# Patient Record
Sex: Female | Born: 1940 | Race: Black or African American | Hispanic: No | Marital: Single | Smoking: Former smoker
Health system: Southern US, Community
[De-identification: ages and names within clinical notes are randomized; demographics above are authoritative.]

## PROBLEM LIST (undated history)

## (undated) DIAGNOSIS — Z972 Presence of dental prosthetic device (complete) (partial): Secondary | ICD-10-CM

## (undated) DIAGNOSIS — R519 Headache, unspecified: Secondary | ICD-10-CM

## (undated) DIAGNOSIS — H269 Unspecified cataract: Secondary | ICD-10-CM

## (undated) DIAGNOSIS — Z8781 Personal history of (healed) traumatic fracture: Secondary | ICD-10-CM

## (undated) DIAGNOSIS — D649 Anemia, unspecified: Secondary | ICD-10-CM

## (undated) DIAGNOSIS — M1990 Unspecified osteoarthritis, unspecified site: Secondary | ICD-10-CM

## (undated) DIAGNOSIS — K08109 Complete loss of teeth, unspecified cause, unspecified class: Secondary | ICD-10-CM

## (undated) DIAGNOSIS — N183 Chronic kidney disease, stage 3 unspecified: Secondary | ICD-10-CM

## (undated) DIAGNOSIS — F419 Anxiety disorder, unspecified: Secondary | ICD-10-CM

## (undated) DIAGNOSIS — N390 Urinary tract infection, site not specified: Secondary | ICD-10-CM

## (undated) DIAGNOSIS — N83201 Unspecified ovarian cyst, right side: Secondary | ICD-10-CM

## (undated) DIAGNOSIS — K5900 Constipation, unspecified: Secondary | ICD-10-CM

## (undated) DIAGNOSIS — Z9221 Personal history of antineoplastic chemotherapy: Secondary | ICD-10-CM

## (undated) DIAGNOSIS — Z973 Presence of spectacles and contact lenses: Secondary | ICD-10-CM

## (undated) DIAGNOSIS — E785 Hyperlipidemia, unspecified: Secondary | ICD-10-CM

## (undated) DIAGNOSIS — K56609 Unspecified intestinal obstruction, unspecified as to partial versus complete obstruction: Secondary | ICD-10-CM

## (undated) DIAGNOSIS — C189 Malignant neoplasm of colon, unspecified: Secondary | ICD-10-CM

## (undated) DIAGNOSIS — T4145XA Adverse effect of unspecified anesthetic, initial encounter: Secondary | ICD-10-CM

## (undated) DIAGNOSIS — I700 Atherosclerosis of aorta: Secondary | ICD-10-CM

## (undated) DIAGNOSIS — T8859XA Other complications of anesthesia, initial encounter: Secondary | ICD-10-CM

## (undated) DIAGNOSIS — Z8719 Personal history of other diseases of the digestive system: Secondary | ICD-10-CM

## (undated) DIAGNOSIS — F03B0 Unspecified dementia, moderate, without behavioral disturbance, psychotic disturbance, mood disturbance, and anxiety: Secondary | ICD-10-CM

## (undated) DIAGNOSIS — R918 Other nonspecific abnormal finding of lung field: Secondary | ICD-10-CM

## (undated) DIAGNOSIS — I10 Essential (primary) hypertension: Secondary | ICD-10-CM

## (undated) DIAGNOSIS — R112 Nausea with vomiting, unspecified: Secondary | ICD-10-CM

## (undated) DIAGNOSIS — N135 Crossing vessel and stricture of ureter without hydronephrosis: Secondary | ICD-10-CM

## (undated) DIAGNOSIS — N858 Other specified noninflammatory disorders of uterus: Secondary | ICD-10-CM

## (undated) DIAGNOSIS — N281 Cyst of kidney, acquired: Secondary | ICD-10-CM

## (undated) DIAGNOSIS — J432 Centrilobular emphysema: Secondary | ICD-10-CM

## (undated) DIAGNOSIS — F0390 Unspecified dementia without behavioral disturbance: Secondary | ICD-10-CM

## (undated) DIAGNOSIS — C787 Secondary malignant neoplasm of liver and intrahepatic bile duct: Secondary | ICD-10-CM

## (undated) DIAGNOSIS — R51 Headache: Secondary | ICD-10-CM

## (undated) HISTORY — PX: PORTACATH PLACEMENT: SHX2246

## (undated) HISTORY — PX: CATARACT EXTRACTION W/ INTRAOCULAR LENS  IMPLANT, BILATERAL: SHX1307

---

## 1998-04-09 ENCOUNTER — Ambulatory Visit (HOSPITAL_COMMUNITY): Admission: RE | Admit: 1998-04-09 | Discharge: 1998-04-09 | Payer: Self-pay | Admitting: Internal Medicine

## 1998-04-09 ENCOUNTER — Encounter: Payer: Self-pay | Admitting: Internal Medicine

## 2000-10-05 ENCOUNTER — Ambulatory Visit (HOSPITAL_COMMUNITY): Admission: RE | Admit: 2000-10-05 | Discharge: 2000-10-05 | Payer: Self-pay | Admitting: Internal Medicine

## 2000-10-05 ENCOUNTER — Encounter: Payer: Self-pay | Admitting: Internal Medicine

## 2001-10-08 ENCOUNTER — Encounter: Payer: Self-pay | Admitting: Internal Medicine

## 2001-10-08 ENCOUNTER — Ambulatory Visit (HOSPITAL_COMMUNITY): Admission: RE | Admit: 2001-10-08 | Discharge: 2001-10-08 | Payer: Self-pay | Admitting: Internal Medicine

## 2002-10-10 ENCOUNTER — Encounter: Payer: Self-pay | Admitting: Internal Medicine

## 2002-10-10 ENCOUNTER — Ambulatory Visit (HOSPITAL_COMMUNITY): Admission: RE | Admit: 2002-10-10 | Discharge: 2002-10-10 | Payer: Self-pay | Admitting: Internal Medicine

## 2002-10-15 ENCOUNTER — Encounter: Admission: RE | Admit: 2002-10-15 | Discharge: 2002-10-15 | Payer: Self-pay | Admitting: Internal Medicine

## 2002-10-15 ENCOUNTER — Encounter: Payer: Self-pay | Admitting: Internal Medicine

## 2003-10-27 ENCOUNTER — Encounter: Admission: RE | Admit: 2003-10-27 | Discharge: 2003-10-27 | Payer: Self-pay | Admitting: Obstetrics and Gynecology

## 2004-10-27 ENCOUNTER — Ambulatory Visit (HOSPITAL_COMMUNITY): Admission: RE | Admit: 2004-10-27 | Discharge: 2004-10-27 | Payer: Self-pay | Admitting: Obstetrics and Gynecology

## 2005-11-01 ENCOUNTER — Ambulatory Visit (HOSPITAL_COMMUNITY): Admission: RE | Admit: 2005-11-01 | Discharge: 2005-11-01 | Payer: Self-pay | Admitting: Obstetrics and Gynecology

## 2006-11-07 ENCOUNTER — Ambulatory Visit (HOSPITAL_COMMUNITY): Admission: RE | Admit: 2006-11-07 | Discharge: 2006-11-07 | Payer: Self-pay | Admitting: Obstetrics and Gynecology

## 2007-11-09 ENCOUNTER — Ambulatory Visit (HOSPITAL_COMMUNITY): Admission: RE | Admit: 2007-11-09 | Discharge: 2007-11-09 | Payer: Self-pay | Admitting: Obstetrics and Gynecology

## 2008-12-30 ENCOUNTER — Ambulatory Visit (HOSPITAL_COMMUNITY): Admission: RE | Admit: 2008-12-30 | Discharge: 2008-12-30 | Payer: Self-pay | Admitting: Obstetrics and Gynecology

## 2011-01-18 ENCOUNTER — Other Ambulatory Visit: Payer: Self-pay | Admitting: Internal Medicine

## 2011-01-18 ENCOUNTER — Other Ambulatory Visit (HOSPITAL_COMMUNITY): Payer: Self-pay | Admitting: Internal Medicine

## 2011-01-18 DIAGNOSIS — Z1231 Encounter for screening mammogram for malignant neoplasm of breast: Secondary | ICD-10-CM

## 2011-02-03 ENCOUNTER — Other Ambulatory Visit (HOSPITAL_COMMUNITY)
Admission: RE | Admit: 2011-02-03 | Discharge: 2011-02-03 | Disposition: A | Discharge status: Home / Self Care | Payer: PRIVATE HEALTH INSURANCE | Source: Ambulatory Visit | Attending: Obstetrics and Gynecology | Admitting: Obstetrics and Gynecology

## 2011-02-03 DIAGNOSIS — R87810 Cervical high risk human papillomavirus (HPV) DNA test positive: Secondary | ICD-10-CM | POA: Insufficient documentation

## 2011-02-03 DIAGNOSIS — Z01419 Encounter for gynecological examination (general) (routine) without abnormal findings: Secondary | ICD-10-CM | POA: Insufficient documentation

## 2011-02-16 ENCOUNTER — Ambulatory Visit (HOSPITAL_COMMUNITY): Payer: Self-pay

## 2011-03-14 ENCOUNTER — Ambulatory Visit (HOSPITAL_COMMUNITY)
Admission: RE | Admit: 2011-03-14 | Discharge: 2011-03-14 | Disposition: A | Discharge status: Home / Self Care | Payer: PRIVATE HEALTH INSURANCE | Source: Ambulatory Visit | Attending: Internal Medicine | Admitting: Internal Medicine

## 2011-03-14 DIAGNOSIS — Z1231 Encounter for screening mammogram for malignant neoplasm of breast: Secondary | ICD-10-CM

## 2013-01-31 ENCOUNTER — Inpatient Hospital Stay (HOSPITAL_COMMUNITY)
Admission: EM | Admit: 2013-01-31 | Discharge: 2013-02-04 | DRG: 536 | Disposition: A | Discharge status: Skilled Nursing Facility | Payer: PRIVATE HEALTH INSURANCE | Attending: Internal Medicine | Admitting: Internal Medicine

## 2013-01-31 ENCOUNTER — Encounter (HOSPITAL_COMMUNITY): Payer: Self-pay | Admitting: Emergency Medicine

## 2013-01-31 ENCOUNTER — Emergency Department (HOSPITAL_COMMUNITY): Payer: PRIVATE HEALTH INSURANCE

## 2013-01-31 DIAGNOSIS — Z833 Family history of diabetes mellitus: Secondary | ICD-10-CM

## 2013-01-31 DIAGNOSIS — Z8049 Family history of malignant neoplasm of other genital organs: Secondary | ICD-10-CM

## 2013-01-31 DIAGNOSIS — Z79899 Other long term (current) drug therapy: Secondary | ICD-10-CM

## 2013-01-31 DIAGNOSIS — S32509A Unspecified fracture of unspecified pubis, initial encounter for closed fracture: Principal | ICD-10-CM | POA: Diagnosis present

## 2013-01-31 DIAGNOSIS — I10 Essential (primary) hypertension: Secondary | ICD-10-CM | POA: Diagnosis present

## 2013-01-31 DIAGNOSIS — K219 Gastro-esophageal reflux disease without esophagitis: Secondary | ICD-10-CM | POA: Diagnosis present

## 2013-01-31 DIAGNOSIS — W010XXA Fall on same level from slipping, tripping and stumbling without subsequent striking against object, initial encounter: Secondary | ICD-10-CM | POA: Diagnosis present

## 2013-01-31 DIAGNOSIS — Z7982 Long term (current) use of aspirin: Secondary | ICD-10-CM

## 2013-01-31 DIAGNOSIS — E785 Hyperlipidemia, unspecified: Secondary | ICD-10-CM | POA: Diagnosis present

## 2013-01-31 DIAGNOSIS — S329XXA Fracture of unspecified parts of lumbosacral spine and pelvis, initial encounter for closed fracture: Secondary | ICD-10-CM

## 2013-01-31 DIAGNOSIS — F17200 Nicotine dependence, unspecified, uncomplicated: Secondary | ICD-10-CM | POA: Diagnosis present

## 2013-01-31 DIAGNOSIS — Y92009 Unspecified place in unspecified non-institutional (private) residence as the place of occurrence of the external cause: Secondary | ICD-10-CM

## 2013-01-31 HISTORY — DX: Essential (primary) hypertension: I10

## 2013-01-31 MED ORDER — OXYCODONE-ACETAMINOPHEN 5-325 MG PO TABS
2.0000 | ORAL_TABLET | Freq: Once | ORAL | Status: AC
  Administered 2013-01-31: 2 via ORAL
  Filled 2013-01-31: qty 2

## 2013-01-31 NOTE — ED Provider Notes (Signed)
Medical screening examination/treatment/procedure(s) were conducted as a shared visit with non-physician practitioner(s) and myself.  I personally evaluated the patient during the encounter.  EKG Interpretation   None       Mechanical fall, pelvic fracture, unable to tolerate weight bearing. Admit for pain control and PT.   Charles B. Bernette Mayers, MD 01/31/13 2308

## 2013-01-31 NOTE — ED Notes (Signed)
Pt st's she slipped in something on floor and fell earlier today.   Pt c/o pain in right groin area.  Has not been wt. Bearing since fall.  Pt denies any other injuries

## 2013-01-31 NOTE — ED Provider Notes (Signed)
CSN: [id number]     Arrival date & time 01/31/13  1946 History   First MD Initiated Contact with Patient 01/31/13 2014     Chief Complaint  Patient presents with  . Groin Pain   (Consider location/radiation/quality/duration/timing/severity/associated sxs/prior Treatment) HPI Comments: Patient is a 72 year old female who presents for right groin pain with onset 2.5 hours ago. Patient states that she was sitting on a barstool when the stool came out from underneath her causing her to fall to the floor. Patient endorses primary impact to her bottom and right hip. Pain is stabbing in nature and intermittent, worse with palpation and movement as well as weightbearing. Patient denies being able to bear weight since the incident secondary to pain. Patient tried drinking some red wine to alleviate her pain without relief. She denies associated head trauma/LOC, bowel or bladder incontinence, genital or perianal numbness, numbness or tingling in her bilateral legs, weakness, and back pain. Patient denies the use of blood thinners.  PCP - Dr. Earl Gala of [facility]  Patient is a 72 y.o. female presenting with groin pain. The history is provided by the patient. No language interpreter was used.  Groin Pain Associated symptoms include arthralgias and myalgias. Pertinent negatives include no fever, numbness or weakness.    Past Medical History  Diagnosis Date  . GERD (gastroesophageal reflux disease)   . Hypertension   . Hypercholesteremia    History reviewed. No pertinent past surgical history. History reviewed. No pertinent family history. History  Substance Use Topics  . Smoking status: Current Some Day Smoker  . Smokeless tobacco: Not on file  . Alcohol Use: Yes     Comment: wine   OB History   Grav Para Term Preterm Abortions TAB SAB Ect Mult Living                 Review of Systems  Constitutional: Negative for fever.  Musculoskeletal: Positive for arthralgias, gait problem and  myalgias. Negative for back pain.  Skin: Negative for color change and pallor.  Neurological: Negative for weakness and numbness.  All other systems reviewed and are negative.    Allergies  Review of patient's allergies indicates not on file.  Home Medications   Current Outpatient Rx  Name  Route  Sig  Dispense  Refill  . esomeprazole (NEXIUM) 40 MG capsule   Oral   Take 40 mg by mouth daily at 12 noon.         . potassium chloride SA (K-DUR,KLOR-CON) 20 MEQ tablet   Oral   Take 20 mEq by mouth daily.         . simvastatin (ZOCOR) 20 MG tablet   Oral   Take 20 mg by mouth daily at 6 PM.         . triamterene-hydrochlorothiazide (MAXZIDE-25) 37.5-25 MG per tablet   Oral   Take 1 tablet by mouth daily.          BP 130/60  Pulse 86  Temp(Src) 98.1 F (36.7 C) (Oral)  Resp 16  Ht 5\' 5"  (1.651 m)  Wt 120 lb (54.432 kg)  BMI 19.97 kg/m2  SpO2 100%  Physical Exam  Nursing note and vitals reviewed. Constitutional: She is oriented to person, place, and time. She appears well-developed and well-nourished. No distress.  HENT:  Head: Normocephalic and atraumatic.  Eyes: Conjunctivae and EOM are normal. No scleral icterus.  Neck: Normal range of motion.  Cardiovascular: Normal rate, regular rhythm and intact distal pulses.   DP  and PT pulses 2+ bilaterally. Capillary refill normal.  Pulmonary/Chest: Effort normal. No respiratory distress.  Musculoskeletal: Normal range of motion.       Right hip: She exhibits tenderness. She exhibits no bony tenderness, no swelling, no crepitus and no deformity.       Legs: TTP of R groin. Near full range of motion of right hip, limited only by pain. Normal flexion, internal rotation, and external rotation. Patient with 4/5 strength against resistance with abduction. 5/5 strength against resistance of R hip joint otherwise. No shortening or malrotation of the right lower extremity noted. No evidence of acute trauma such as ecchymosis,  abrasions, hematoma, or skin tears.  Neurological: She is alert and oriented to person, place, and time.  GCS 15. No gross sensory deficits appreciated. DTRs normal and symmetric in bilateral lower extremities.  Skin: Skin is warm and dry. No rash noted. She is not diaphoretic. No erythema. No pallor.  Psychiatric: She has a normal mood and affect. Her behavior is normal.    ED Course  Procedures (including critical care time)  Labs Review Labs Reviewed - No data to display  Imaging Review Dg Hip Complete Right  01/31/2013   CLINICAL DATA:  Right hip pain after fall.  EXAM: RIGHT HIP - COMPLETE 2+ VIEW  COMPARISON:  None.  FINDINGS: Moderately displaced fracture is seen involving the right superior pubic ramus. The proximal right femur appears normal. Left hip joint appears normal.  IMPRESSION: Moderately displaced fracture is seen involving the right superior pubic ramus with possible involvement of the pubic symphysis and right inferior pubic ramus. CT scan may be performed for further evaluation.   Electronically Signed   By: Roque Lias M.D.   On: 01/31/2013 21:21    EKG Interpretation   None       MDM   1. Pelvic fracture, closed, initial encounter     Patient is a 72 year old female who presents for right groin pain after a fall. Patient neurovascularly intact on physical exam with near full range of motion of her right hip joint. No sensory deficits appreciated. Hip x-ray significant for moderately displaced fracture of the right superior pubic ramus with questionable involvement of the pubic symphysis and inferior ramus. Patient's pain well controlled with PO Percocet. Will consult with Dr. Charlann Boxer of ortho to discuss management/care plan.    Dr. Charlann Boxer comfortable with d/c if patient able to weight bear with crutches/walker; however, patient unable to even turn in bed unassisted. Walks without assistance at baseline. Will admit to hospitalist for PT evaluation in AM. Toniann Fail  to admit and temp admit orders placed.      Antony Madura, PA-C 01/31/13 2259

## 2013-02-01 ENCOUNTER — Encounter (HOSPITAL_COMMUNITY): Payer: Self-pay | Admitting: Internal Medicine

## 2013-02-01 DIAGNOSIS — E785 Hyperlipidemia, unspecified: Secondary | ICD-10-CM

## 2013-02-01 DIAGNOSIS — I10 Essential (primary) hypertension: Secondary | ICD-10-CM

## 2013-02-01 DIAGNOSIS — S329XXA Fracture of unspecified parts of lumbosacral spine and pelvis, initial encounter for closed fracture: Secondary | ICD-10-CM

## 2013-02-01 LAB — BASIC METABOLIC PANEL
BUN: 11 mg/dL (ref 6–23)
CO2: 24 mEq/L (ref 19–32)
Calcium: 8.6 mg/dL (ref 8.4–10.5)
Chloride: 101 mEq/L (ref 96–112)
Creatinine, Ser: 0.9 mg/dL (ref 0.50–1.10)
GFR calc Af Amer: 73 mL/min — ABNORMAL LOW (ref >90)
GFR calc non Af Amer: 63 mL/min — ABNORMAL LOW (ref >90)
Glucose, Bld: 119 mg/dL — ABNORMAL HIGH (ref 70–99)
Potassium: 3.7 mEq/L (ref 3.5–5.1)
Sodium: 137 mEq/L (ref 135–145)

## 2013-02-01 LAB — CBC
HCT: 32.3 % — ABNORMAL LOW (ref 36.0–46.0)
Hemoglobin: 11.2 g/dL — ABNORMAL LOW (ref 12.0–15.0)
MCH: 30.4 pg (ref 26.0–34.0)
MCHC: 34.7 g/dL (ref 30.0–36.0)
MCV: 87.8 fL (ref 78.0–100.0)
Platelets: 280 10*3/uL (ref 150–400)
RBC: 3.68 MIL/uL — ABNORMAL LOW (ref 3.87–5.11)
RDW: 14.9 % (ref 11.5–15.5)
WBC: 9.7 10*3/uL (ref 4.0–10.5)

## 2013-02-01 LAB — TYPE AND SCREEN
ABO/RH(D): O POS
Antibody Screen: NEGATIVE

## 2013-02-01 LAB — ABO/RH: ABO/RH(D): O POS

## 2013-02-01 MED ORDER — SODIUM CHLORIDE 0.9 % IV SOLN
INTRAVENOUS | Status: DC
  Administered 2013-02-01: 20 mL/h via INTRAVENOUS

## 2013-02-01 MED ORDER — ACETAMINOPHEN 325 MG PO TABS
650.0000 mg | ORAL_TABLET | Freq: Four times a day (QID) | ORAL | Status: DC | PRN
  Administered 2013-02-02 – 2013-02-04 (×5): 650 mg via ORAL
  Filled 2013-02-01 (×5): qty 2

## 2013-02-01 MED ORDER — ONDANSETRON HCL 4 MG PO TABS: 4.0000 mg | ORAL_TABLET | Freq: Four times a day (QID) | ORAL | Status: DC | PRN

## 2013-02-01 MED ORDER — SIMVASTATIN 20 MG PO TABS
20.0000 mg | ORAL_TABLET | Freq: Every day | ORAL | Status: DC
  Administered 2013-02-01 – 2013-02-03 (×3): 20 mg via ORAL
  Filled 2013-02-01 (×4): qty 1

## 2013-02-01 MED ORDER — HYDRALAZINE HCL 20 MG/ML IJ SOLN: 10.0000 mg | INTRAMUSCULAR | Status: DC | PRN

## 2013-02-01 MED ORDER — TRAMADOL HCL 50 MG PO TABS
50.0000 mg | ORAL_TABLET | Freq: Four times a day (QID) | ORAL | Status: DC | PRN
  Administered 2013-02-01 – 2013-02-03 (×4): 50 mg via ORAL
  Filled 2013-02-01 (×5): qty 1

## 2013-02-01 MED ORDER — ONDANSETRON HCL 4 MG/2ML IJ SOLN: 4.0000 mg | Freq: Four times a day (QID) | INTRAMUSCULAR | Status: DC | PRN

## 2013-02-01 MED ORDER — PANTOPRAZOLE SODIUM 40 MG PO TBEC
40.0000 mg | DELAYED_RELEASE_TABLET | Freq: Every day | ORAL | Status: DC
  Administered 2013-02-01 – 2013-02-04 (×4): 40 mg via ORAL
  Filled 2013-02-01 (×4): qty 1

## 2013-02-01 MED ORDER — OXYCODONE-ACETAMINOPHEN 5-325 MG PO TABS
1.0000 | ORAL_TABLET | Freq: Four times a day (QID) | ORAL | Status: DC | PRN
  Administered 2013-02-01 (×2): 1 via ORAL
  Filled 2013-02-01 (×2): qty 1

## 2013-02-01 MED ORDER — MORPHINE SULFATE 2 MG/ML IJ SOLN
1.0000 mg | INTRAMUSCULAR | Status: DC | PRN
  Administered 2013-02-01: 1 mg via INTRAVENOUS

## 2013-02-01 MED ORDER — MORPHINE SULFATE 2 MG/ML IJ SOLN
INTRAMUSCULAR | Status: AC
  Administered 2013-02-01: 2 mg via INTRAVENOUS
  Filled 2013-02-01: qty 1

## 2013-02-01 MED ORDER — POTASSIUM CHLORIDE CRYS ER 20 MEQ PO TBCR
20.0000 meq | EXTENDED_RELEASE_TABLET | Freq: Every day | ORAL | Status: DC
  Administered 2013-02-01 – 2013-02-04 (×4): 20 meq via ORAL
  Filled 2013-02-01 (×4): qty 1

## 2013-02-01 MED ORDER — KETOROLAC TROMETHAMINE 30 MG/ML IJ SOLN: 30.0000 mg | Freq: Four times a day (QID) | INTRAMUSCULAR | Status: DC | PRN

## 2013-02-01 MED ORDER — BOOST / RESOURCE BREEZE PO LIQD
1.0000 | Freq: Three times a day (TID) | ORAL | Status: DC
  Administered 2013-02-01 – 2013-02-04 (×8): 1 via ORAL

## 2013-02-01 MED ORDER — ACETAMINOPHEN 650 MG RE SUPP: 650.0000 mg | Freq: Four times a day (QID) | RECTAL | Status: DC | PRN

## 2013-02-01 MED ORDER — TRIAMTERENE-HCTZ 37.5-25 MG PO TABS
1.0000 | ORAL_TABLET | Freq: Every day | ORAL | Status: DC
  Administered 2013-02-01 – 2013-02-04 (×4): 1 via ORAL
  Filled 2013-02-01 (×4): qty 1

## 2013-02-01 NOTE — Progress Notes (Signed)
Assessment/Plan: Principal Problem:   Pelvis fracture - she is reasonably comfortable in bed. I told her that she may or may not need SNF rehab stay depending on pain control and ability to ambulate. Dtr and pt understanding this. Ortho to see today but I suspect she is safe for weight bearing.  Active Problems:   HTN (hypertension)   HLD (hyperlipidemia)   Pelvic fracture   Subjective: REasonably comfortable in bed after oxycodone about an hour ago.   Objective:  Vital Signs: Filed Vitals:   01/31/13 2200 01/31/13 2300 02/01/13 0048 02/01/13 0512  BP: 130/60 137/72 160/87 126/55  Pulse: 86 86 100 101  Temp:   97.3 F (36.3 C) 98.2 F (36.8 C)  TempSrc:   Axillary   Resp:   18 18  Height:      Weight:      SpO2: 100% 99% 100% 100%     EXAM: alert, comfortable.    Intake/Output Summary (Last 24 hours) at 02/01/13 0825 Last data filed at 02/01/13 0245  Gross per 24 hour  Intake      0 ml  Output    600 ml  Net   -600 ml    Lab Results:  Recent Labs  02/01/13 0425  NA 137  K 3.7  CL 101  CO2 24  GLUCOSE 119*  BUN 11  CREATININE 0.90  CALCIUM 8.6   No results found for this basename: AST, ALT, ALKPHOS, BILITOT, PROT, ALBUMIN,  in the last 72 hours No results found for this basename: LIPASE, AMYLASE,  in the last 72 hours  Recent Labs  02/01/13 0425  WBC 9.7  HGB 11.2*  HCT 32.3*  MCV 87.8  PLT 280   No results found for this basename: CKTOTAL, CKMB, CKMBINDEX, TROPONINI,  in the last 72 hours No components found with this basename: POCBNP,  No results found for this basename: DDIMER,  in the last 72 hours No results found for this basename: HGBA1C,  in the last 72 hours No results found for this basename: CHOL, HDL, LDLCALC, TRIG, CHOLHDL, LDLDIRECT,  in the last 72 hours No results found for this basename: TSH, T4TOTAL, FREET3, T3FREE, THYROIDAB,  in the last 72 hours No results found for this basename: VITAMINB12, FOLATE, FERRITIN, TIBC, IRON,  RETICCTPCT,  in the last 72 hours  Studies/Results: Dg Hip Complete Right  01/31/2013   CLINICAL DATA:  Right hip pain after fall.  EXAM: RIGHT HIP - COMPLETE 2+ VIEW  COMPARISON:  None.  FINDINGS: Moderately displaced fracture is seen involving the right superior pubic ramus. The proximal right femur appears normal. Left hip joint appears normal.  IMPRESSION: Moderately displaced fracture is seen involving the right superior pubic ramus with possible involvement of the pubic symphysis and right inferior pubic ramus. CT scan may be performed for further evaluation.   Electronically Signed   By: Roque Lias M.D.   On: 01/31/2013 21:21   Medications: Medications administered in the last 24 hours reviewed.  Current Medication List reviewed.    LOS: 1 day   [facility] @ [facility] [phone]) 02/01/2013, 8:25 AM

## 2013-02-01 NOTE — H&P (Addendum)
[facility] History and Physical  Beth Wilkins WUJ:[id number] DOB: 05-11-1940 DOA: 01/31/2013  Referring physician: ER physician. PCP: No PCP Per Patient Dr. Earl Gala.  Chief Complaint: Fall with right groin pain.  HPI: Beth Wilkins is a 72 y.o. female history of hypertension and hyperlipidemia had a fall at her house tripping from a bar stool. Patient landed on her buttocks but did not lose consciousness or hit her head. X-rays revealed pelvic fracture and patient has pain on minimal moment of her right hip. On-call orthopedic surgeon Dr. Ferd Hibbs was consulted by ER physician and has advised admission for physical therapy and pain management and they will be seeing patient in consult. Patient otherwise denies any chest pain or shortness of breath nausea vomiting or diarrhea.  Review of Systems: As presented in the history of presenting illness, rest negative.  Past Medical History  Diagnosis Date  . GERD (gastroesophageal reflux disease)   . Hypertension   . Hypercholesteremia    History reviewed. No pertinent past surgical history. Social History:  reports that she has been smoking.  She does not have any smokeless tobacco history on file. She reports that she drinks alcohol. She reports that she does not use illicit drugs. Where does patient live home. Can patient participate in ADLs? Yes.  Not on File  Family History:  Family History  Problem Relation Age of Onset  . Diabetes Mellitus II Father   . Cervical cancer Other       Prior to Admission medications   Medication Sig Start Date End Date Taking? Authorizing Provider  esomeprazole (NEXIUM) 40 MG capsule Take 40 mg by mouth daily at 12 noon.   Yes Historical Provider, MD  potassium chloride SA (K-DUR,KLOR-CON) 20 MEQ tablet Take 20 mEq by mouth daily.   Yes Historical Provider, MD  simvastatin (ZOCOR) 20 MG tablet Take 20 mg by mouth daily at 6 PM.   Yes Historical Provider, MD   triamterene-hydrochlorothiazide (MAXZIDE-25) 37.5-25 MG per tablet Take 1 tablet by mouth daily.   Yes Historical Provider, MD    Physical Exam: Filed Vitals:   01/31/13 2030 01/31/13 2045 01/31/13 2200 01/31/13 2300  BP: 141/73 124/72 130/60 137/72  Pulse: 86 77 86 86  Temp:      TempSrc:      Resp:      Height:      Weight:      SpO2: 100% 100% 100% 99%     General:  Well-developed well-nourished.  Eyes: Anicteric no pallor.  ENT: No discharge from ears eyes nose mouth.  Neck: No mass felt.  Cardiovascular: S1-S2 heard.  Respiratory: No rhonchi or crepitations.  Abdomen: Soft nontender bowel sounds present.  Skin: No rash.  Musculoskeletal: Pain on moving right hip.  Psychiatric: Appears normal.  Neurologic: Alert awake oriented to time place and person. Moves all extremities.  Labs on Admission:  Basic Metabolic Panel: No results found for this basename: NA, K, CL, CO2, GLUCOSE, BUN, CREATININE, CALCIUM, MG, PHOS,  in the last 168 hours Liver Function Tests: No results found for this basename: AST, ALT, ALKPHOS, BILITOT, PROT, ALBUMIN,  in the last 168 hours No results found for this basename: LIPASE, AMYLASE,  in the last 168 hours No results found for this basename: AMMONIA,  in the last 168 hours CBC: No results found for this basename: WBC, NEUTROABS, HGB, HCT, MCV, PLT,  in the last 168 hours Cardiac Enzymes: No results found for this basename: CKTOTAL, CKMB, CKMBINDEX, TROPONINI,  in the last 168 hours  BNP (last 3 results) No results found for this basename: PROBNP,  in the last 8760 hours CBG: No results found for this basename: GLUCAP,  in the last 168 hours  Radiological Exams on Admission: Dg Hip Complete Right  01/31/2013   CLINICAL DATA:  Right hip pain after fall.  EXAM: RIGHT HIP - COMPLETE 2+ VIEW  COMPARISON:  None.  FINDINGS: Moderately displaced fracture is seen involving the right superior pubic ramus. The proximal right femur  appears normal. Left hip joint appears normal.  IMPRESSION: Moderately displaced fracture is seen involving the right superior pubic ramus with possible involvement of the pubic symphysis and right inferior pubic ramus. CT scan may be performed for further evaluation.   Electronically Signed   By: Roque Lias M.D.   On: 01/31/2013 21:21     Assessment/Plan Principal Problem:   Pelvis fracture Active Problems:   HTN (hypertension)   HLD (hyperlipidemia)   Pelvic fracture   1. Pelvic fracture status post mechanical fall - patient has been placed on pain relief medications and physical therapy has been consulted. Orthopedics to see for further recommendations. 2. Hypertension - continue home medications. 3. Hyperlipidemia - continue home medications.  Patient's labs are pending.    Code Status: Full code.  Family Communication: Family at the bedside.  Disposition Plan: Admit to inpatient under Dr. Earl Gala service.   Doryan Bahl N. [facility] Pager [phone].  If 7PM-7AM, please contact night-coverage [email or web address] Password [email or web address] 02/01/2013, 12:10 AM

## 2013-02-01 NOTE — Progress Notes (Signed)
INITIAL NUTRITION ASSESSMENT  DOCUMENTATION CODES Per approved criteria  -Not Applicable   INTERVENTION:  1. Resource Breeze po TID, each supplement provides 250 kcal and 9 grams of protein.  2. Encouraged small frequent high calorie, high protein meals/snacks. Examples provided.   NUTRITION DIAGNOSIS: Inadequate oral intake related to decreased appetite, early satiety as evidenced by Meal Completion: <50%.   Goal: Pt to meet >/= 90% of their estimated nutrition needs   Monitor:  PO intake, supplement acceptance, weight trend, labs  Reason for Assessment: Pt identified as at nutrition risk on the Malnutrition Screen Tool  72 y.o. female  Admitting Dx: Pelvis fracture  ASSESSMENT: Pt admitted after a fall at home with a pelvic fx.  Spoke with pt and her middle daughter. Pt is unsure of her weight and does not seem concerned about her weight or appetite. However, daughter is very concerned. Per daughter pt eats very small amounts throughout the day. Pt drinks coffee and smokes all day.  Pt not willing to drink ensure but will try Resource Breeze.   Nutrition Focused Physical Exam:  Subcutaneous Fat:  Orbital Region: WNL Upper Arm Region: WNL Thoracic and Lumbar Region: WNL  Muscle:  Temple Region: WNL Clavicle Bone Region: WNL Clavicle and Acromion Bone Region: WNL Scapular Bone Region: WNL Dorsal Hand: WNL Patellar Region: WNL Anterior Thigh Region: WNL Posterior Calf Region: WNL  Edema: not present   Height: Ht Readings from Last 1 Encounters:  01/31/13 5\' 5"  (1.651 m)    Weight: Wt Readings from Last 1 Encounters:  01/31/13 120 lb (54.432 kg)    Ideal Body Weight: 56.8 kg   % Ideal Body Weight: 96%  Wt Readings from Last 10 Encounters:  01/31/13 120 lb (54.432 kg)    Usual Body Weight: pt unsure  % Usual Body Weight:  -  BMI:  Body mass index is 19.97 kg/(m^2).  Estimated Nutritional Needs: Kcal: 1450-1600 Protein: 70-80 grams Fluid: >  1.5 L/day  Skin: no issues noted  Diet Order: Cardiac  EDUCATION NEEDS: -Education needs addressed   Intake/Output Summary (Last 24 hours) at 02/01/13 1128 Last data filed at 02/01/13 0245  Gross per 24 hour  Intake      0 ml  Output    600 ml  Net   -600 ml    Last BM: PTA   Labs:   Recent Labs Lab 02/01/13 0425  NA 137  K 3.7  CL 101  CO2 24  BUN 11  CREATININE 0.90  CALCIUM 8.6  GLUCOSE 119*    CBG (last 3)  No results found for this basename: GLUCAP,  in the last 72 hours  Scheduled Meds: . pantoprazole  40 mg Oral Daily  . potassium chloride SA  20 mEq Oral Daily  . simvastatin  20 mg Oral q1800  . triamterene-hydrochlorothiazide  1 tablet Oral Daily    Continuous Infusions: . sodium chloride 20 mL/hr (02/01/13 0052)    Past Medical History  Diagnosis Date  . GERD (gastroesophageal reflux disease)   . Hypertension   . Hypercholesteremia     History reviewed. No pertinent past surgical history.  Kendell Bane RD, LDN, CNSC [phone] Pager [phone] After Hours Pager

## 2013-02-01 NOTE — Progress Notes (Signed)
After giving pt percocet at 1650, pt was agitated at 1800.  She said that she was tired of people playing games with her; and, she would not answer questions about where she was or why she was here.  She said that she could leave whenever she wanted to leave.  After talking to pt's family, they attributed pt's confusion to percocet.  Dr. Debby Bud notified, orders received.  Nsg to continue to monitor.

## 2013-02-01 NOTE — Evaluation (Signed)
Physical Therapy Evaluation Patient Details Name: Beth Wilkins MRN: [id number] DOB: 1940-04-30 Today's Date: 02/01/2013 Time: 4098-1191 PT Time Calculation (min): 24 min  PT Assessment / Plan / Recommendation History of Present Illness  Pt is a 72 y/o female admitted s/p fall at home sustaining a right superior pubic ramus fracture. Ortho consult pending and will keep pt NWB on RLE until cleared by ortho.   Clinical Impression  This patient presents with acute pain and decreased functional independence following the above mentioned procedure. At the time of PT eval, pt required mod assist for transition to standing and assist for stability and walker placement during SPT to recliner. This patient is appropriate for skilled PT interventions to address functional limitations, improve safety and independence with functional mobility, and return to PLOF.    PT Assessment  Patient needs continued PT services    Follow Up Recommendations  SNF    Does the patient have the potential to tolerate intense rehabilitation      Barriers to Discharge Decreased caregiver support Unclear how much help she will have at home.    Equipment Recommendations  Rolling walker with 5" wheels;3in1 (PT)    Recommendations for Other Services     Frequency Min 5X/week    Precautions / Restrictions Precautions Precautions: Fall Restrictions Weight Bearing Restrictions: Yes RLE Weight Bearing: Non weight bearing Other Position/Activity Restrictions: NWB until cleared by ortho   Pertinent Vitals/Pain 8/10 when "catches", but otherwise minimal pain reported      Mobility  Bed Mobility Bed Mobility: Supine to Sit;Sitting - Scoot to Edge of Bed Supine to Sit: 4: Min assist;HOB elevated;With rails Sitting - Scoot to Edge of Bed: 4: Min assist Details for Bed Mobility Assistance: Min assist for movement of RLE to EOB. Bed pad used to assist pt to transition to edge and rest feet on  floor. Transfers Transfers: Sit to Stand;Stand to [employer] Transfers Sit to Stand: 3: Mod assist;From bed;With upper extremity assist Stand to Sit: 4: Min assist;To chair/3-in-1;With upper extremity assist Stand Pivot Transfers: 4: Min assist;3: Mod assist Details for Transfer Assistance: VC's for hand placement and to maintain NWB on RLE. Due to pain pt was propping foot on lowered bed rail so she would not have to extend hip and put foot on floor - pt cued for safety awareness. Assist for walker placement and trunk support during SPT. Ambulation/Gait Ambulation/Gait Assistance: Not tested (comment)    Exercises     PT Diagnosis: Difficulty walking;Acute pain  PT Problem List: Decreased strength;Decreased range of motion;Decreased activity tolerance;Decreased balance;Decreased mobility;Decreased knowledge of use of DME;Decreased safety awareness;Pain PT Treatment Interventions: DME instruction;Gait training;Stair training;Functional mobility training;Therapeutic activities;Therapeutic exercise;Neuromuscular re-education;Patient/family education     PT Goals(Current goals can be found in the care plan section) Acute Rehab PT Goals Patient Stated Goal: To return to PLOF PT Goal Formulation: With patient/family Time For Goal Achievement: 02/08/13 Potential to Achieve Goals: Good  Visit Information  Last PT Received On: 02/01/13 Assistance Needed: +1 History of Present Illness: Pt is a 72 y/o female admitted s/p fall at home sustaining a right superior pubic ramus fracture. Ortho consult pending and will keep pt NWB on RLE until cleared by ortho.        Prior Functioning  Home Living Family/patient expects to be discharged to:: Private residence Living Arrangements: Children Available Help at Discharge: Family;Friend(s);Available PRN/intermittently Type of Home: House Home Access: Stairs to enter Entrance Stairs-Number of Steps: 1 Entrance Stairs-Rails:  (Door jam)  Home  Layout: One level Home Equipment: None Prior Function Level of Independence: Independent Communication Communication: No difficulties Dominant Hand: Right    Cognition  Cognition Arousal/Alertness: Awake/alert Behavior During Therapy: WFL for tasks assessed/performed Overall Cognitive Status: Within Functional Limits for tasks assessed    Extremity/Trunk Assessment Upper Extremity Assessment Upper Extremity Assessment: Defer to OT evaluation Lower Extremity Assessment Lower Extremity Assessment: RLE deficits/detail RLE Deficits / Details: Acute pain and limited AROM due to above mentioned injury RLE: Unable to fully assess due to pain Cervical / Trunk Assessment Cervical / Trunk Assessment: Kyphotic   Balance Balance Balance Assessed: Yes Static Sitting Balance Static Sitting - Balance Support: Feet supported;Bilateral upper extremity supported Static Sitting - Level of Assistance: 5: Stand by assistance Static Standing Balance Static Standing - Balance Support: Bilateral upper extremity supported Static Standing - Level of Assistance: 4: Min assist  End of Session PT - End of Session Equipment Utilized During Treatment: Gait belt Activity Tolerance: Patient limited by pain Patient left: in chair;with call bell/phone within reach;with family/visitor present Nurse Communication: Mobility status  GP     Ruthann Cancer 02/01/2013, 11:52 AM  Ruthann Cancer, PT, DPT [phone]

## 2013-02-02 MED ORDER — ASPIRIN EC 81 MG PO TBEC: 81.0000 mg | DELAYED_RELEASE_TABLET | Freq: Every day | ORAL | Status: DC

## 2013-02-02 NOTE — Progress Notes (Addendum)
Clinical Social Work Department CLINICAL SOCIAL WORK PLACEMENT NOTE 02/02/2013  Patient:  Beth Wilkins, Beth Wilkins  Account Number:  [id number] Admit date:  01/31/2013  Clinical Social Worker:  Jetta Lout, Theresia Majors  Date/time:  02/02/2013 05:30 PM  Clinical Social Work is seeking post-discharge placement for this patient at the following level of care:   SKILLED NURSING   (*CSW will update this form in Epic as items are completed)   02/02/2013  Patient/family provided with [facility] Department of Clinical Social Work's list of facilities offering this level of care within the geographic area requested by the patient (or if unable, by the patient's family).  02/02/2013  Patient/family informed of their freedom to choose among providers that offer the needed level of care, that participate in Medicare, Medicaid or managed care program needed by the patient, have an available bed and are willing to accept the patient.  02/02/2013  Patient/family informed of [facility]' ownership interest in [facility], as well as of the fact that they are under no obligation to receive care at this facility.  PASARR submitted to EDS on 02/02/2013 PASARR number received from EDS on 02/02/2013  FL2 transmitted to all facilities in geographic area requested by pt/family on  02/02/2013 FL2 transmitted to all facilities within larger geographic area on   Patient informed that his/her managed care company has contracts with or will negotiate with  certain facilities, including the following:     Patient/family informed of bed offers received:  02/04/13 Patient chooses bed at [facility] Physician recommends and patient chooses bed at    Patient to be transferred to  on    02/04/13 Patient to be transferred to facility by [facility]  The following physician request were entered in Epic:   Additional Comments: Patient and daughter's first choice is [facility], second choice is [facility]

## 2013-02-02 NOTE — Progress Notes (Signed)
Occupational Therapy Evaluation Patient Details Name: Beth Wilkins MRN: [id number] DOB: 1940-08-15 Today's Date: 02/02/2013 Time: 4098-1191 OT Time Calculation (min): 46 min  OT Assessment / Plan / Recommendation History of present illness Pt is a 72 y/o female admitted s/p fall at home sustaining a right superior pubic ramus fracture. Ortho consult pending and will keep pt NWB on RLE until cleared by ortho.    Clinical Impression   PTA, pt lived with daughter and was independent with ADL and mobility. Daughter discussed her mother's occasional confusion and STM loss PTA. PT with decline in functional status. Pt will need SNF for rehab to facilitate return to PLOF.     OT Assessment  Patient needs continued OT Services    Follow Up Recommendations  SNF    Barriers to Discharge Decreased caregiver support (caregiver works during the day)    Equipment Recommendations  3 in 1 bedside comode;Tub/shower bench    Recommendations for Other Services    Frequency  Min 2X/week    Precautions / Restrictions Precautions Precautions: Fall Restrictions Weight Bearing Restrictions: Yes RLE Weight Bearing: Non weight bearing Other Position/Activity Restrictions: NWB until cleared by ortho   Pertinent Vitals/Pain C/o pain R hip. 3/10    ADL  Grooming: Set up Where Assessed - Grooming: Supported sitting Upper Body Bathing: Set up;Supervision/safety Where Assessed - Upper Body Bathing: Unsupported sitting Lower Body Bathing: Moderate assistance Where Assessed - Lower Body Bathing: Supported sit to stand Upper Body Dressing: Supervision/safety;Set up Where Assessed - Upper Body Dressing: Unsupported sitting Lower Body Dressing: Moderate assistance Where Assessed - Lower Body Dressing: Supported sit to Pharmacist, hospital: Minimal assistance Statistician Method: Sit to Barista: Other (comment) (simulated with recliner) Toileting - Clothing Manipulation  and Hygiene: Minimal assistance Where Assessed - Toileting Clothing Manipulation and Hygiene: Sit to stand from 3-in-1 or toilet Equipment Used: Gait belt;Rolling walker Transfers/Ambulation Related to ADLs: min A ADL Comments: May benefit from AE. Educated daughter on use of tub bench for home    OT Diagnosis: Generalized weakness;Acute pain  OT Problem List: Decreased strength;Decreased activity tolerance;Decreased safety awareness;Decreased knowledge of use of DME or AE;Decreased knowledge of precautions;Pain OT Treatment Interventions: Self-care/ADL training;Therapeutic exercise;DME and/or AE instruction;Energy conservation;Therapeutic activities;Patient/family education   OT Goals(Current goals can be found in the care plan section) Acute Rehab OT Goals Patient Stated Goal: To return to PLOF OT Goal Formulation: With patient Time For Goal Achievement: 02/16/13 Potential to Achieve Goals: Good  Visit Information  Last OT Received On: 02/02/13 Assistance Needed: +1 History of Present Illness: Pt is a 72 y/o female admitted s/p fall at home sustaining a right superior pubic ramus fracture. Ortho consult pending and will keep pt NWB on RLE until cleared by ortho.        Prior Functioning     Home Living Family/patient expects to be discharged to:: Skilled nursing facility Living Arrangements: Children Available Help at Discharge: Family;Friend(s);Available PRN/intermittently Type of Home: House Home Access: Stairs to enter [employer] of Steps: 1 Entrance Stairs-Rails:  (Door jam) Home Layout: One level Home Equipment: None Prior Function Level of Independence: Independent Communication Communication: No difficulties Dominant Hand: Right         Vision/Perception Vision - History Baseline Vision: Wears glasses all the time   Cognition  Cognition Arousal/Alertness: Awake/alert Behavior During Therapy: WFL for tasks assessed/performed Overall Cognitive  Status: Impaired/Different from baseline Area of Impairment: Orientation;Attention;Memory;Awareness;Problem solving Orientation Level: Disoriented to;Time Current Attention Level: Sustained  Memory: Decreased recall of precautions;Decreased short-term memory Awareness: Emergent Problem Solving: Slow processing;Difficulty sequencing;Requires verbal cues General Comments: Daughter states her mother had STM deficits and symptoms of dementia PTA, but not diagnosed with dementia    Extremity/Trunk Assessment Upper Extremity Assessment Upper Extremity Assessment: Overall WFL for tasks assessed Lower Extremity Assessment Lower Extremity Assessment: Defer to PT evaluation RLE Deficits / Details: Acute pain and limited AROM due to above mentioned injury RLE: Unable to fully assess due to pain Cervical / Trunk Assessment Cervical / Trunk Assessment: Normal     Mobility Bed Mobility Bed Mobility: Supine to Sit;Sitting - Scoot to Edge of Bed Supine to Sit: 4: Min assist;HOB elevated;With rails Sitting - Scoot to Edge of Bed: 4: Min assist Details for Bed Mobility Assistance: Educated pt on using LLE to move RLE off bed Transfers Transfers: Sit to Stand;Stand to Sit Sit to Stand: From bed;With upper extremity assist;4: Min assist Stand to Sit: 4: Min assist;To chair/3-in-1;With upper extremity assist Details for Transfer Assistance: multiple vc for hand placement and sequencing of RW use     Exercise Other Exercises Other Exercises: encouraged BLE AROM within pain tolerance while sitting up in chair   Balance Balance Balance Assessed: Yes Static Sitting Balance Static Sitting - Balance Support: Feet supported;Bilateral upper extremity supported Static Sitting - Level of Assistance: 6: Modified independent (Device/Increase time) Static Standing Balance Static Standing - Balance Support: Bilateral upper extremity supported Static Standing - Level of Assistance: 4: Min assist   End of  Session OT - End of Session Equipment Utilized During Treatment: Gait belt;Rolling walker Activity Tolerance: Patient tolerated treatment well Patient left: in chair;with call bell/phone within reach;with family/visitor present Nurse Communication: Mobility status  GO     Wyett Narine,HILLARY 02/02/2013, 9:37 AM [facility], OTR/L  [phone] 02/02/2013

## 2013-02-02 NOTE — Consult Note (Signed)
Reason for Consult:right superior and inferior pubic ramus fractures Referring Physician: Dr Suzan Garibaldi is an 71 y.o. female.  HPI: 72 yo female s/p fall at home on her birthday.  Patient lost her balance with a toddler in her lap and she fell to floor trying to protect the child.  Complained of right hip/groin pain.  Patient unable to bear weight on the right LE without pain.  Admitted by internal medicine.  Past Medical History  Diagnosis Date  . GERD (gastroesophageal reflux disease)   . Hypertension   . Hypercholesteremia     History reviewed. No pertinent past surgical history.  Family History  Problem Relation Age of Onset  . Diabetes Mellitus II Father   . Cervical cancer Other     Social History:  reports that she has been smoking.  She does not have any smokeless tobacco history on file. She reports that she drinks alcohol. She reports that she does not use illicit drugs.  Allergies: Not on File  Medications: I have reviewed the patient's current medications.  Results for orders placed during the hospital encounter of 01/31/13 (from the past 48 hour(s))  BASIC METABOLIC PANEL     Status: Abnormal   Collection Time    02/01/13  4:25 AM      Result Value Range   Sodium 137  135 - 145 mEq/L   Potassium 3.7  3.5 - 5.1 mEq/L   Chloride 101  96 - 112 mEq/L   CO2 24  19 - 32 mEq/L   Glucose, Bld 119 (*) 70 - 99 mg/dL   BUN 11  6 - 23 mg/dL   Creatinine, Ser 1.61  0.50 - 1.10 mg/dL   Calcium 8.6  8.4 - 09.6 mg/dL   GFR calc non Af Amer 63 (*) >90 mL/min   GFR calc Af Amer 73 (*) >90 mL/min   Comment: (NOTE)     The eGFR has been calculated using the CKD EPI equation.     This calculation has not been validated in all clinical situations.     eGFR's persistently <90 mL/min signify possible Chronic Kidney     Disease.  CBC     Status: Abnormal   Collection Time    02/01/13  4:25 AM      Result Value Range   WBC 9.7  4.0 - 10.5 K/uL   RBC 3.68 (*) 3.87  - 5.11 MIL/uL   Hemoglobin 11.2 (*) 12.0 - 15.0 g/dL   HCT 04.5 (*) 40.9 - 81.1 %   MCV 87.8  78.0 - 100.0 fL   MCH 30.4  26.0 - 34.0 pg   MCHC 34.7  30.0 - 36.0 g/dL   RDW 91.4  78.2 - 95.6 %   Platelets 280  150 - 400 K/uL  TYPE AND SCREEN     Status: None   Collection Time    02/01/13  6:25 AM      Result Value Range   ABO/RH(D) O POS     Antibody Screen NEG     Sample Expiration 02/04/2013    ABO/RH     Status: None   Collection Time    02/01/13  6:25 AM      Result Value Range   ABO/RH(D) O POS      Dg Hip Complete Right  01/31/2013   CLINICAL DATA:  Right hip pain after fall.  EXAM: RIGHT HIP - COMPLETE 2+ VIEW  COMPARISON:  None.  FINDINGS: Moderately  displaced fracture is seen involving the right superior pubic ramus. The proximal right femur appears normal. Left hip joint appears normal.  IMPRESSION: Moderately displaced fracture is seen involving the right superior pubic ramus with possible involvement of the pubic symphysis and right inferior pubic ramus. CT scan may be performed for further evaluation.   Electronically Signed   By: Roque Lias M.D.   On: 01/31/2013 21:21    ROS Blood pressure 152/74, pulse 75, temperature 98.7 F (37.1 C), temperature source Axillary, resp. rate 18, height 5\' 5"  (1.651 m), weight 54.432 kg (120 lb), SpO2 99.00%. Physical Exam  Awake and alert, sitting up comfortably in a chair visiting with her daughter.  Patient has no UE pain with full AROM and 5/5 strength throughtout.  Right LE with some pain with attempted hip flexion and IR/ER. Knee and ankle nontender.  Left LE with pain free ROM of the hip, knee, and ankle. T and L spine nontender and right sacral area nontender.   Assessment/Plan: Right superior and inferior pubic ramus fractures, no evidence of sacral injury.  This is a stable pelvic fracture which will not require surgical intervention.  Ok to mobilize with PT and OT, WBAT on the right LE, WBAT on the non-injured Left LE as  well. Will follow.  Thanks for the consult.   Adana Marik,STEVEN R 02/02/2013, 11:24 AM  [phone]

## 2013-02-02 NOTE — Progress Notes (Signed)
Subjective: Pt with confusion with percocet Now better Still with pain  Objective: Vital signs in last 24 hours: Temp:  [98.1 F (36.7 C)-98.7 F (37.1 C)] 98.7 F (37.1 C) (11/29 0640) Pulse Rate:  [75-77] 75 (11/29 0640) Resp:  [18] 18 (11/29 0640) BP: (152-156)/(74-75) 152/74 mmHg (11/29 0640) SpO2:  [99 %-100 %] 99 % (11/29 0640) Weight change:  Last BM Date: 01/31/13  Intake/Output from previous day: 11/28 0701 - 11/29 0700 In: 600 [P.O.:600] Out: 300 [Urine:300] Intake/Output this shift:    General appearance: alert Resp: clear to auscultation bilaterally GI: soft, non-tender; bowel sounds normal; no masses,  no organomegaly Extremities: extremities normal, atraumatic, no cyanosis or edema  Lab Results:  Recent Labs  02/01/13 0425  WBC 9.7  HGB 11.2*  HCT 32.3*  PLT 280   BMET  Recent Labs  02/01/13 0425  NA 137  K 3.7  CL 101  CO2 24  GLUCOSE 119*  BUN 11  CREATININE 0.90  CALCIUM 8.6    Studies/Results: Dg Hip Complete Right  01/31/2013   CLINICAL DATA:  Right hip pain after fall.  EXAM: RIGHT HIP - COMPLETE 2+ VIEW  COMPARISON:  None.  FINDINGS: Moderately displaced fracture is seen involving the right superior pubic ramus. The proximal right femur appears normal. Left hip joint appears normal.  IMPRESSION: Moderately displaced fracture is seen involving the right superior pubic ramus with possible involvement of the pubic symphysis and right inferior pubic ramus. CT scan may be performed for further evaluation.   Electronically Signed   By: Roque Lias M.D.   On: 01/31/2013 21:21    Medications: I have reviewed the patient's current medications.  Assessment/Plan: Principal Problem:  Pelvis fracture - she is reasonably comfortable in bed. Intolerant to percocet- ortho f/u PT Ok for tramadol and ketorelac pain- may consider vicodin if needed dtr with pt- may need SNF HTN (hypertension) - control on Maxizde HLD (hyperlipidemia)  Pelvic  fracture   LOS: 2 days   Rodnisha Blomgren 02/02/2013, 8:10 AM

## 2013-02-02 NOTE — Progress Notes (Signed)
Clinical Social Work Department BRIEF PSYCHOSOCIAL ASSESSMENT 02/02/2013  Patient:  JAYLEI, FUERTE     Account Number:  [id number]     Admit date:  01/31/2013  Clinical Social Worker:  Hendricks Milo  Date/Time:  02/02/2013 05:23 PM  Referred by:  Physician  Date Referred:  02/01/2013 Referred for  SNF Placement   Other Referral:   Interview type:  Family Other interview type:    PSYCHOSOCIAL DATA Living Status:  WITH ADULT CHILDREN Admitted from facility:   Level of care:   Primary support name:  Emmilee Reamer [phone] Primary support relationship to patient:  CHILD, ADULT Degree of support available:   Very supportive at bedside and completed assessment with Child psychotherapist.    CURRENT CONCERNS  Other Concerns:    SOCIAL WORK ASSESSMENT / PLAN Clinical Social Worker (CSW) met with patient and patient's daughter Erie Noe who was at bedside to discuss SNF placement. Daughter and patient agreeable to SNF search in [place]. Their first choice is [facility]. Their second choice is [facility]. Patient lives with daughter and plans to return to daughter's house when short term rehab is over.   Assessment/plan status:  Psychosocial Support/Ongoing Assessment of Needs Other assessment/ plan:   Information/referral to community resources:   CSW gave daughter SNF list.    PATIENT'S/FAMILY'S RESPONSE TO PLAN OF CARE: Daughter and patient thanked CSW for explaining how short term rehab works.

## 2013-02-03 NOTE — Progress Notes (Signed)
Subjective: Pt feel better Pain control  Objective: Vital signs in last 24 hours: Temp:  [97.7 F (36.5 C)-98.2 F (36.8 C)] 98.2 F (36.8 C) (11/30 0601) Pulse Rate:  [79-82] 79 (11/30 0601) Resp:  [17-20] 17 (11/30 0601) BP: (129-140)/(52-80) 129/52 mmHg (11/30 0601) SpO2:  [100 %] 100 % (11/30 0601) Weight change:  Last BM Date: 02/02/13  Intake/Output from previous day: 11/29 0701 - 11/30 0700 In: 800 [P.O.:800] Out: -  Intake/Output this shift:    General appearance: alert Resp: clear to auscultation bilaterally Cardio: regular rate and rhythm Extremities: extremities normal, atraumatic, no cyanosis or edema  Lab Results:  Recent Labs  02/01/13 0425  WBC 9.7  HGB 11.2*  HCT 32.3*  PLT 280   BMET  Recent Labs  02/01/13 0425  NA 137  K 3.7  CL 101  CO2 24  GLUCOSE 119*  BUN 11  CREATININE 0.90  CALCIUM 8.6    Studies/Results: No results found.  Medications: I have reviewed the patient's current medications.  Assessment/Plan: Pelvis fracture - she is reasonably comfortable in bed. Intolerant to percocet- ortho- note- ok for WBAT- PT  Ok for tramadol and ketorelac pain- control ok- avoid percocet. HTN (hypertension) - control on Maxizde  HLD (hyperlipidemia)  Ok for SNF tomorrow- pt agreeable.   LOS: 3 days   Juri Dinning 02/03/2013, 7:50 AM

## 2013-02-03 NOTE — Progress Notes (Signed)
Orthopedics Progress Note  Subjective: I am doing better today.  Objective:  Filed Vitals:   02/03/13 0601  BP: 129/52  Pulse: 79  Temp: 98.2 F (36.8 C)  Resp: 17    General: Awake and alert  Musculoskeletal: right LE with pain during AROM, good distal perfusion Neurovascularly intact  Lab Results  Component Value Date   WBC 9.7 02/01/2013   HGB 11.2* 02/01/2013   HCT 32.3* 02/01/2013   MCV 87.8 02/01/2013   PLT 280 02/01/2013       Component Value Date/Time   NA 137 02/01/2013 0425   K 3.7 02/01/2013 0425   CL 101 02/01/2013 0425   CO2 24 02/01/2013 0425   GLUCOSE 119* 02/01/2013 0425   BUN 11 02/01/2013 0425   CREATININE 0.90 02/01/2013 0425   CALCIUM 8.6 02/01/2013 0425   GFRNONAA 63* 02/01/2013 0425   GFRAA 73* 02/01/2013 0425    No results found for this basename: INR, PROTIME    Assessment/Plan:  s/p stable pelvic ring injury after fall Continue PT,OT  D/C planning.   Ok to [facility] although she will probably limit her weight on the right side due to pain. TED hose  Almedia Balls. Ranell Patrick, MD 02/03/2013 6:53 AM

## 2013-02-04 ENCOUNTER — Encounter (HOSPITAL_COMMUNITY): Payer: Self-pay | Admitting: General Practice

## 2013-02-04 MED ORDER — ACETAMINOPHEN 325 MG PO TABS: 650.0000 mg | ORAL_TABLET | Freq: Four times a day (QID) | ORAL | Status: DC | PRN

## 2013-02-04 MED ORDER — TRAMADOL HCL 50 MG PO TABS: 50.0000 mg | ORAL_TABLET | Freq: Four times a day (QID) | ORAL | Status: DC | PRN

## 2013-02-04 NOTE — Discharge Summary (Signed)
Physician Discharge Summary  NAME:Beth Wilkins  ZOX:[id number]  DOB: [birth year]   Admit date: 01/31/2013 Discharge date: 02/04/2013  Admitting Diagnosis: Pelvic fracture  Discharge Diagnoses:  Active Hospital Problems   Diagnosis Date Noted  . Pelvis fracture 01/31/2013  . HTN (hypertension) 01/31/2013  . HLD (hyperlipidemia) 01/31/2013    Resolved Hospital Problems   Diagnosis Date Noted Date Resolved  No resolved problems to display.    Things to follow up in the outpatient setting: none  Hospital Course: patient admitted after mechanical fall with superior pubic ramus fracture. She was treated with pain meds and developed some confusion with oxycodone. This was changed to tramadol which gave her adequate relief.   She was able to walk WBAT with a walker with PT. She is transferred to SNF-rehab until she is safe for ambulation at home.   Discharge Condition: improved.   Consults: orthopedics  Disposition: SNF-rhab  Discharge Orders   Future Orders Complete By Expires   Diet - low sodium heart healthy  As directed    Increase activity slowly  As directed    Weight bearing as tolerated  As directed    Questions:     Laterality:  right   Extremity:  Lower       Medication List         acetaminophen 325 MG tablet  Commonly known as:  TYLENOL  Take 2 tablets (650 mg total) by mouth every 6 (six) hours as needed for mild pain (or Fever >/= 101).     aspirin EC 81 MG tablet  Take 1 tablet (81 mg total) by mouth daily.     esomeprazole 40 MG capsule  Commonly known as:  NEXIUM  Take 40 mg by mouth daily at 12 noon.     potassium chloride SA 20 MEQ tablet  Commonly known as:  K-DUR,KLOR-CON  Take 20 mEq by mouth daily.     simvastatin 20 MG tablet  Commonly known as:  ZOCOR  Take 20 mg by mouth daily at 6 PM.     traMADol 50 MG tablet  Commonly known as:  ULTRAM  Take 1 tablet (50 mg total) by mouth every 6 (six) hours as needed for severe pain.     triamterene-hydrochlorothiazide 37.5-25 MG per tablet  Commonly known as:  MAXZIDE-25  Take 1 tablet by mouth daily.           Follow-up Information   Follow up with NORRIS,STEVEN R, MD. Schedule an appointment as soon as possible for a visit in 2 weeks. [phone])    Specialty:  Orthopedic Surgery   Contact information:   [address] [place] [address] [phone]       Time coordinating discharge: 25 minutes including medication reconciliation,  preparation of discharge papers, and discussion with patient     Signed: Darnelle Bos 02/04/2013, 7:21 AM 2

## 2013-02-04 NOTE — Progress Notes (Signed)
Patient doing fine. Still a lot of pain with ambulation. I have completed d/c summary except for signature and d/c orders except for signature in case bed is ready today.

## 2013-02-04 NOTE — Progress Notes (Signed)
Physical Therapy Treatment Patient Details Name: Beth Wilkins MRN: [id number] DOB: 04-Aug-1940 Today's Date: 02/04/2013 Time: 9147-8295 PT Time Calculation (min): 28 min  PT Assessment / Plan / Recommendation  History of Present Illness Pt is a 72 y/o female admitted s/p fall at home sustaining a right superior pubic ramus fracture. Ortho consult pending and will keep pt NWB on RLE until cleared by ortho.    PT Comments   Pt progressing very well with mobility.  Does report pain as 10/10 with WBing but pt without facial grimmacing & did not appear to be in discomfort.     Follow Up Recommendations  SNF     Does the patient have the potential to tolerate intense rehabilitation     Barriers to Discharge        Equipment Recommendations  Rolling walker with 5" wheels;3in1 (PT)    Recommendations for Other Services    Frequency Min 5X/week   Progress towards PT Goals Progress towards PT goals: Progressing toward goals  Plan Current plan remains appropriate    Precautions / Restrictions Precautions Precautions: Fall Restrictions Weight Bearing Restrictions: No RLE Weight Bearing: Weight bearing as tolerated   Pertinent Vitals/Pain "no pain when I'm sitting; 10/10 when I put weight through it"      Mobility  Bed Mobility Bed Mobility: Not assessed Details for Bed Mobility Assistance: Pt sitting up on EOB upon arrival with her daughter present Transfers Transfers: Sit to Stand;Stand to Sit Sit to Stand: 5: Supervision;With upper extremity assist;From bed;From chair/3-in-1;With armrests Stand to Sit: 5: Supervision;With upper extremity assist;With armrests;To chair/3-in-1 Details for Transfer Assistance: cues for hand placement Ambulation/Gait Ambulation/Gait Assistance: 4: Min guard Ambulation Distance (Feet): 60 Feet Assistive device: Rolling walker Ambulation/Gait Assistance Details: cues for sequencing, safe RW advancement, & encouragement for increased heel strike  RLE.   Gait Pattern: Step-through pattern;Decreased weight shift to right Gait velocity: decreased Stairs: No Wheelchair Mobility Wheelchair Mobility: No    Exercises General Exercises - Lower Extremity Ankle Circles/Pumps: AROM;Both;10 reps Long Arc Quad: AROM;Left;10 reps Hip ABduction/ADduction: AROM;Strengthening;Right;10 reps Straight Leg Raises: AROM;Strengthening;Right;10 reps Hip Flexion/Marching: AROM;Strengthening;Right;10 reps     PT Goals (current goals can now be found in the care plan section) Acute Rehab PT Goals PT Goal Formulation: With patient/family Time For Goal Achievement: 02/08/13 Potential to Achieve Goals: Good  Visit Information  Last PT Received On: 02/04/13 Assistance Needed: +1 History of Present Illness: Pt is a 72 y/o female admitted s/p fall at home sustaining a right superior pubic ramus fracture. Ortho consult pending and will keep pt NWB on RLE until cleared by ortho.     Subjective Data      Cognition  Cognition Arousal/Alertness: Awake/alert Behavior During Therapy: WFL for tasks assessed/performed General Comments: Daughter states her mother had STM deficits and symptoms of dementia PTA, but not diagnosed with dementia    Balance     End of Session PT - End of Session Activity Tolerance: Patient tolerated treatment well Patient left: in chair;with call bell/phone within reach;with family/visitor present Nurse Communication: Mobility status   GP     Lara Mulch 02/04/2013, 9:10 AM  Verdell Face, PTA [phone] 02/04/2013

## 2013-02-04 NOTE — Progress Notes (Signed)
Orthopedics Progress Note  Subjective: Patient still complains of pain with WB  Objective:  Filed Vitals:   02/04/13 0609  BP: 127/83  Pulse: 78  Temp: 98.4 F (36.9 C)  Resp: 18    General: Awake and alert  Dr Earl Gala at the bedside. Exam deferred  Lab Results  Component Value Date   WBC 9.7 02/01/2013   HGB 11.2* 02/01/2013   HCT 32.3* 02/01/2013   MCV 87.8 02/01/2013   PLT 280 02/01/2013       Component Value Date/Time   NA 137 02/01/2013 0425   K 3.7 02/01/2013 0425   CL 101 02/01/2013 0425   CO2 24 02/01/2013 0425   GLUCOSE 119* 02/01/2013 0425   BUN 11 02/01/2013 0425   CREATININE 0.90 02/01/2013 0425   CALCIUM 8.6 02/01/2013 0425   GFRNONAA 63* 02/01/2013 0425   GFRAA 73* 02/01/2013 0425    No results found for this basename: INR, PROTIME    Assessment/Plan:  s/p pelvic ring injury Patient awaiting SNF/rehab placement WBAT - currently toe touch is all she can handle Follow up with me in the office in 2-3 weeks  Almedia Balls. Ranell Patrick, MD 02/04/2013 7:41 AM

## 2013-02-05 ENCOUNTER — Non-Acute Institutional Stay (SKILLED_NURSING_FACILITY): Payer: Medicare Other | Admitting: Adult Health

## 2013-02-05 DIAGNOSIS — S329XXA Fracture of unspecified parts of lumbosacral spine and pelvis, initial encounter for closed fracture: Secondary | ICD-10-CM

## 2013-02-05 DIAGNOSIS — I10 Essential (primary) hypertension: Secondary | ICD-10-CM

## 2013-02-05 DIAGNOSIS — E785 Hyperlipidemia, unspecified: Secondary | ICD-10-CM

## 2013-02-05 DIAGNOSIS — K219 Gastro-esophageal reflux disease without esophagitis: Secondary | ICD-10-CM

## 2013-02-05 NOTE — Progress Notes (Signed)
Clinical social worker assisted with patient discharge to skilled nursing facility, [facility].  CSW addressed all family questions and concerns. CSW copied chart and added all important documents. CSW also set up patient transportation with [facility]. Clinical Social Worker will sign off for now as social work intervention is no longer needed.   Maeson Purohit, MSW, LCSWA [phone] 

## 2013-02-06 ENCOUNTER — Non-Acute Institutional Stay (SKILLED_NURSING_FACILITY): Payer: Medicare Other | Admitting: Internal Medicine

## 2013-02-06 DIAGNOSIS — E785 Hyperlipidemia, unspecified: Secondary | ICD-10-CM

## 2013-02-06 DIAGNOSIS — S329XXA Fracture of unspecified parts of lumbosacral spine and pelvis, initial encounter for closed fracture: Secondary | ICD-10-CM

## 2013-02-06 DIAGNOSIS — K219 Gastro-esophageal reflux disease without esophagitis: Secondary | ICD-10-CM

## 2013-02-06 DIAGNOSIS — I10 Essential (primary) hypertension: Secondary | ICD-10-CM

## 2013-02-11 ENCOUNTER — Non-Acute Institutional Stay (SKILLED_NURSING_FACILITY): Payer: Medicare Other | Admitting: Adult Health

## 2013-02-11 DIAGNOSIS — S329XXA Fracture of unspecified parts of lumbosacral spine and pelvis, initial encounter for closed fracture: Secondary | ICD-10-CM

## 2013-02-11 DIAGNOSIS — K219 Gastro-esophageal reflux disease without esophagitis: Secondary | ICD-10-CM

## 2013-02-11 DIAGNOSIS — E785 Hyperlipidemia, unspecified: Secondary | ICD-10-CM

## 2013-02-11 DIAGNOSIS — I10 Essential (primary) hypertension: Secondary | ICD-10-CM

## 2013-02-15 DIAGNOSIS — K219 Gastro-esophageal reflux disease without esophagitis: Secondary | ICD-10-CM | POA: Insufficient documentation

## 2013-02-15 NOTE — Progress Notes (Addendum)
Patient ID: Beth Wilkins, female   DOB: 1941-02-16, 72 y.o.   MRN: [id number]                         PROGRESS NOTE  DATE: 02/05/2013  FACILITY: Nursing Home Location: [facility]  LEVEL OF CARE: SNF (31)  Acute Visit  CHIEF COMPLAINT:  Follow-up hospitalization  HISTORY OF PRESENT ILLNESS: This is a 72 year old female who has been admitted to [facility] on 02/04/13 from [facility]. She fell at home and sustained a superior pubic ramus fracture. She has been admitted for a short-term rehabilitation.  REASSESSMENT OF ONGOING PROBLEM(S):  HTN: Pt 's HTN remains stable.  Denies CP, sob, DOE, pedal edema, headaches, dizziness or visual disturbances.  No complications from the medications currently being used.  Last BP : 140/72  GERD: pt's GERD is stable.  Denies ongoing heartburn, abd. Pain, nausea or vomiting.  Currently on a PPI & tolerates it without any adverse reactions.  HYPERLIPIDEMIA: No complications from the medications presently being used.    PAST MEDICAL HISTORY : Reviewed.  No changes.  CURRENT MEDICATIONS: Reviewed per [facility]  REVIEW OF SYSTEMS:  GENERAL: no change in appetite, no fatigue, no weight changes, no fever, chills or weakness RESPIRATORY: no cough, SOB, DOE, wheezing, hemoptysis CARDIAC: no chest pain, edema or palpitations GI: no abdominal pain, diarrhea, constipation, heart burn, nausea or vomiting  PHYSICAL EXAMINATION  VS:  T97       P88      RR21      BP140/72     POX %     WT (Lb)  GENERAL: no acute distress, normal body habitus EYES: conjunctivae normal, sclerae normal, normal eye lids NECK: supple, trachea midline, no neck masses, no thyroid tenderness, no thyromegaly LYMPHATICS: no LAN in the neck, no supraclavicular LAN RESPIRATORY: breathing is even & unlabored, BS CTAB CARDIAC: RRR, no murmur,no extra heart sounds, no edema GI: abdomen soft, normal BS, no masses, no tenderness, no hepatomegaly, no  splenomegaly PSYCHIATRIC: the patient is alert & oriented to person, affect & behavior appropriate  LABS/RADIOLOGY: 02/01/13 sodium 137 potassium 3.7 glucose 119 BUN 11 creatinine 0.90 calcium 8.6 WBC 9.7 hemoglobin 11.2 hematocrit 32.3  ASSESSMENT/PLAN  Pelvic fracture - for rehabilitation   Hypertension - well-controlled;  Continue Maxzide  Hyperlipidemia continue Zocor  GERD - continue Nexium   CPT CODE: 40981

## 2013-02-15 NOTE — Progress Notes (Signed)
Patient ID: Beth Wilkins, female   DOB: October 30, 1940, 72 y.o.   MRN: [id number]              PROGRESS NOTE  DATE: 02/11/2013   FACILITY: [facility]  LEVEL OF CARE: SNF (31)  Acute Visit  CHIEF COMPLAINT:  Discharge Notes  HISTORY OF PRESENT ILLNESS: This is a 72 year old female who is for discharge home with Home health PT, OT and Nursing. DME: Rolling-walker and bedside commode due to unsteady gait. She [facility]04[facility]13 from [facility]. She fell at home and sustained a superior pubic ramus fracture. Patient was admitted to this facility for short-term rehabilitation after the patient's recent hospitalization.  Patient has completed SNF rehabilitation and therapy has cleared the patient for discharge.  Reassessment of ongoing problem(s):  HTN: Pt 's HTN remains stable.  Denies CP, sob, DOE, pedal edema, headaches, dizziness or visual disturbances.  No complications from the medications currently being used.  Last BP : 103/73  HYPERLIPIDEMIA: No complications from the medications presently being used.   GERD: pt's GERD is stable.  Denies ongoing heartburn, abd. Pain, nausea or vomiting.  Currently on a PPI & tolerates it without any adverse reactions.   PAST MEDICAL HISTORY : Reviewed.  No changes.  CURRENT MEDICATIONS: Reviewed per [facility]  REVIEW OF SYSTEMS:  GENERAL: no change in appetite, no fatigue, no weight changes, no fever, chills or weakness RESPIRATORY: no cough, SOB, DOE, wheezing, hemoptysis CARDIAC: no chest pain, edema or palpitations GI: no abdominal pain, diarrhea, constipation, heart burn, nausea or vomiting  PHYSICAL EXAMINATION  VS:  T98.5       P77       RR18      BP103/73      POX98 %       WT (Lb)  GENERAL: no acute distress, normal body habitus NECK: supple, trachea midline, no neck masses, no thyroid tenderness, no thyromegaly LYMPHATICS: no LAN in the neck, no supraclavicular LAN RESPIRATORY:  breathing is even & unlabored, BS CTAB CARDIAC: RRR, no murmur,no extra heart sounds, no edema GI: abdomen soft, normal BS, no masses, no tenderness, no hepatomegaly, no splenomegaly PSYCHIATRIC: the patient is alert & oriented to person, affect & behavior appropriate  LABS/RADIOLOGY: 02/07/13 WBC 7.4 hemoglobin 11.4 hematocrit 34.5 sodium 131 potassium 4.2 glucose 102 BUN 18 creatinine 1.0 calcium 9.4 02/01/13 sodium 137 potassium 3.7 glucose 119 BUN 11 creatinine 0.90 calcium 8.6 WBC 9.7 hemoglobin 11.2 hematocrit 32.3  ASSESSMENT/PLAN:  Pelvic fracture - for Home health PT, OT and Nursing  Hypertension - well-controlled; continue Maxzide  Hyperlipidemia - continue Zocor  GERD - continue Nexium  I have filled out patient's discharge paperwork and written prescriptions.  Patient will receive home health PT, OT and Nursing.  DME provided: Rolling-walker and bedside commode  Total discharge time: Greater than 30 minutes Discharge time involved coordination of the discharge process with Child psychotherapist, nursing staff and therapy department. Medical justification for home health services/DME verified.  CPT CODE: 40981

## 2013-03-07 DIAGNOSIS — Z8781 Personal history of (healed) traumatic fracture: Secondary | ICD-10-CM

## 2013-03-07 HISTORY — DX: Personal history of (healed) traumatic fracture: Z87.81

## 2013-04-08 NOTE — Progress Notes (Signed)
Patient ID: Beth Wilkins, female   DOB: [birth year], 73 y.o.   MRN: [id number]               HISTORY & PHYSICAL  DATE: 12/032014     FACILITY: [facility]  LEVEL OF CARE: SNF (31)  ALLERGIES: NKDA  CHIEF COMPLAINT:  Manage pelvic fracture, hypertension, and hyperlipidemia.    HISTORY OF PRESENT ILLNESS:  The patient is a 73 year-old, African-American female.    PELVIC FRACTURE:  The patient had a mechanical fall and sustained a superior pubic ramus fracture.  After hospitalization, she is admitted to this facility for short-term rehabilitation.  She denies pelvic pain.    HTN: Pt 's HTN remains stable.  Denies CP, sob, DOE, pedal edema, headaches, dizziness or visual disturbances.  No complications from the medications currently being used.  Last BP :   140/72.    HYPERLIPIDEMIA: No complications from the medications presently being used. Last fasting lipid panel showed :  Not available.    PAST MEDICAL HISTORY :  Past Medical History  Diagnosis Date  . GERD (gastroesophageal reflux disease)   . Hypertension   . Hypercholesteremia   . Pelvis fracture 01/31/2013    PAST SURGICAL HISTORY: Past Surgical History  Procedure Laterality Date  . Cataract extraction, bilateral      SOCIAL HISTORY:  reports that she quit smoking about 2 months ago. Her smoking use included Cigarettes. She smoked 0.00 packs per day. She has never used smokeless tobacco. She reports that she drinks alcohol. She reports that she does not use illicit drugs.  FAMILY HISTORY:  Family History  Problem Relation Age of Onset  . Diabetes Mellitus II Father   . Cervical cancer Other     CURRENT MEDICATIONS: Reviewed per [facility]  REVIEW OF SYSTEMS:  See HPI otherwise 14 point ROS is negative.  PHYSICAL EXAMINATION  VS:  T 97      P 88      RR 21      BP 140/72        GENERAL: no acute distress, normal body habitus EYES: conjunctivae normal, sclerae normal, normal eye  lids MOUTH/THROAT: lips without lesions,no lesions in the mouth,tongue is without lesions,uvula elevates in midline NECK: supple, trachea midline, no neck masses, no thyroid tenderness, no thyromegaly LYMPHATICS: no LAN in the neck, no supraclavicular LAN RESPIRATORY: breathing is even & unlabored, BS CTAB CARDIAC: RRR, no murmur,no extra heart sounds, no edema GI:  ABDOMEN: abdomen soft, normal BS, no masses, no tenderness  LIVER/SPLEEN: no hepatomegaly, no splenomegaly MUSCULOSKELETAL: HEAD: normal to inspection & palpation BACK: no kyphosis, scoliosis or spinal processes tenderness EXTREMITIES: LEFT UPPER EXTREMITY: full range of motion, normal strength & tone RIGHT UPPER EXTREMITY:  full range of motion, normal strength & tone LEFT LOWER EXTREMITY: difficult to assess due to pain   RIGHT LOWER EXTREMITY: strength intact, range of motion moderate    PSYCHIATRIC: the patient is alert & oriented to person, affect & behavior appropriate  LABS/RADIOLOGY:  Labs reviewed: Basic Metabolic Panel:  Recent Labs  02/01/13 0425  NA 137  K 3.7  CL 101  CO2 24  GLUCOSE 119*  BUN 11  CREATININE 0.90  CALCIUM 8.6   CBC:  Recent Labs  02/01/13 0425  WBC 9.7  HGB 11.2*  HCT 32.3*  MCV 87.8  PLT 280    ASSESSMENT/PLAN:  Pelvic fracture.  Continue rehabilitation.     Hypertension.  Blood pressure borderline.  Hyperlipidemia.  Continue Zocor.    GERD.  Well controlled.     Hypokalemia.  Continue supplementation.  Reassess potassium level.    Check CBC and BMP.    THN Metrics:   Aspirin 81 mg.  Positive tobacco use.    I have reviewed patient's medical records received at admission/from hospitalization.  CPT CODE: 27253

## 2013-04-09 ENCOUNTER — Encounter: Payer: Self-pay | Admitting: Internal Medicine

## 2013-08-16 ENCOUNTER — Ambulatory Visit (INDEPENDENT_AMBULATORY_CARE_PROVIDER_SITE_OTHER): Payer: Medicare Other | Admitting: Neurology

## 2013-08-16 ENCOUNTER — Encounter: Payer: Self-pay | Admitting: Neurology

## 2013-08-16 VITALS — BP 122/80 | HR 76 | Resp 16 | Ht 65.5 in | Wt 121.0 lb

## 2013-08-16 DIAGNOSIS — R413 Other amnesia: Secondary | ICD-10-CM

## 2013-08-16 DIAGNOSIS — R480 Dyslexia and alexia: Secondary | ICD-10-CM

## 2013-08-16 NOTE — Progress Notes (Signed)
NEUROLOGY CONSULTATION NOTE  Beth Wilkins MRN: [id number] DOB: 12-28-1940  Referring provider: Dr. Nehemiah Settle Primary care provider: Dr. Nehemiah Settle  Reason for consult:  Memory loss  Dear Dr Maxwell Caul:  Thank you for your kind referral of Beth Wilkins for consultation of the above symptoms. Although her history is well known to you, please allow me to reiterate it for the purpose of our medical record. The patient was accompanied to the clinic by her daughter who also provides collateral information. Records and images were personally reviewed where available.  HISTORY OF PRESENT ILLNESS: This is a 73 year old right-handed woman with a history of hypertension and hyperlipidemia presenting for evaluation of memory loss that her daughter started to notice in September 2013.  Her daughter is a Catering manager and was away for 2 months in 2013, when she got home in September 2013, their house was "ridiculously hot" over 90 degrees. Her mother did not seem concerned about it and stated the [facility] was not working, however her daughter became concerned that this was not addressed by the patient.  She had fallen in 01/2013 and sustained a pelvic fracture.  Since then, her daughter started noticing short-term memory problems, asking repeated questions, with focusing difficulties, worse with stress or when people are arguing around her, making her agitated.  The patient reports that she would think of something then get tangled, taking a while to get her thoughts back.  Her daughter has noticed that she can spell words but sometimes would not recognize them (she could not recognize if it was December or January written down). She reports that with prescription glasses, she was able to put words together. Her daughter is concerned that she has to practice writing a check beforehand, however the patient reports that she has always done this.  She has only missed paying one bill, her daughter reports  she has been very good with this.  She occasionally forgets to take her medications and uses a pillbox.  She continues to drive and does not get disoriented.    Her daughter has started getting newspapers so she would be up to date on current events, and got a chalkboard at home. Her daughter reports that if she eats on time, she can function well. However if meals are not fixed, she can eat just a pack of children's cereal and not eat the rest of the day.  There is no family history of similar symptoms.  She denies any headaches, dizziness, diplopia, dysarthria, dysphagia, bowel/bladder dysfunction, tremors. She has occasional neck and back pain, pain in her left knee and hip. She feels both thumbs occasionally stiffen and tingle.  She reports mood is pretty good, she is a quiet person typically. She frowns at her daughter when she expressed concern that she would sit in a dark room watching TV.  Her daughter is concerned about an incident last March where she became very upset when the cashier at [facility] asked for her driver's license when paying with a credit card, stating she never needed to show this before.  She gets easily irritated with new experiences.    Lab Results  Component Value Date   WBC 9.7 02/01/2013   HGB 11.2* 02/01/2013   HCT 32.3* 02/01/2013   MCV 87.8 02/01/2013   PLT 280 02/01/2013     Chemistry      Component Value Date/Time   NA 137 02/01/2013 0425   K 3.7 02/01/2013 0425   CL  101 02/01/2013 0425   CO2 24 02/01/2013 0425   BUN 11 02/01/2013 0425   CREATININE 0.90 02/01/2013 0425      Component Value Date/Time   CALCIUM 8.6 02/01/2013 0425     PAST MEDICAL HISTORY: Past Medical History  Diagnosis Date  . GERD (gastroesophageal reflux disease)   . Hypertension   . Hypercholesteremia   . Pelvis fracture 01/31/2013    PAST SURGICAL HISTORY: Past Surgical History  Procedure Laterality Date  . Cataract extraction, bilateral      MEDICATIONS: Current  Outpatient Prescriptions on File Prior to Visit  Medication Sig Dispense Refill  . acetaminophen (TYLENOL) 325 MG tablet Take 2 tablets (650 mg total) by mouth every 6 (six) hours as needed for mild pain (or Fever >/= 101).      Marland Kitchen esomeprazole (NEXIUM) 40 MG capsule Take 40 mg by mouth daily at 12 noon.      . potassium chloride SA (K-DUR,KLOR-CON) 20 MEQ tablet Take 20 mEq by mouth daily.      . simvastatin (ZOCOR) 20 MG tablet Take 20 mg by mouth daily at 6 PM.      . traMADol (ULTRAM) 50 MG tablet Take 1 tablet (50 mg total) by mouth every 6 (six) hours as needed for severe pain.  30 tablet    . triamterene-hydrochlorothiazide (MAXZIDE-25) 37.5-25 MG per tablet Take 1 tablet by mouth daily.       No current facility-administered medications on file prior to visit.    ALLERGIES: No Known Allergies  FAMILY HISTORY: Family History  Problem Relation Age of Onset  . Diabetes Mellitus II Father   . Cervical cancer Other     SOCIAL HISTORY: History   Social History  . Marital Status: Legally Separated    Spouse Name: N/A    Number of Children: N/A  . Years of Education: N/A   Occupational History  . Not on file.   Social History Main Topics  . Smoking status: Current Some Day Smoker    Types: Cigarettes  . Smokeless tobacco: Never Used     Comment: smokes occasionally  . Alcohol Use: Yes     Comment: wine  . Drug Use: No  . Sexual Activity: Not on file   Other Topics Concern  . Not on file   Social History Narrative  . No narrative on file    REVIEW OF SYSTEMS: Constitutional: No fevers, chills, or sweats, no generalized fatigue, change in appetite Eyes: No visual changes, double vision, eye pain Ear, nose and throat: No hearing loss, ear pain, nasal congestion, sore throat Cardiovascular: No chest pain, palpitations Respiratory:  No shortness of breath at rest or with exertion, wheezes GastrointestinaI: No nausea, vomiting, diarrhea, abdominal pain, fecal  incontinence Genitourinary:  No dysuria, urinary retention or frequency Musculoskeletal:  + neck pain, back pain Integumentary: No rash, pruritus, skin lesions Neurological: as above Psychiatric: No depression, insomnia, anxiety Endocrine: No palpitations, fatigue, diaphoresis, mood swings, change in appetite, change in weight, increased thirst Hematologic/Lymphatic:  No anemia, purpura, petechiae. Allergic/Immunologic: no itchy/runny eyes, nasal congestion, recent allergic reactions, rashes  PHYSICAL EXAM: Filed Vitals:   08/16/13 1334  BP: 122/80  Pulse: 76  Resp: 16   General: No acute distress Head:  Normocephalic/atraumatic Eyes: Fundoscopic exam shows bilateral sharp discs, no vessel changes, exudates, or hemorrhages Neck: supple, no paraspinal tenderness, full range of motion Back: No paraspinal tenderness Heart: regular rate and rhythm Lungs: Clear to auscultation bilaterally. Vascular: No carotid bruits. Skin/Extremities:  No rash, no edema Neurological Exam: Mental status: alert and oriented to person, place. She guessed the month as May and year 2015, gave wrong day of week and guessed we are in the middle of the month.  No dysarthria.  Fund of knowledge is appropriate.  Remote memory are intact.  Attention and concentration are normal.    Able to name objects and repeat phrases. 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  She is able to write a sentence: "I will work harder in the" then lost train of thought to finish sentence.  MMSE 19/30 (missed 5 points for orientation, 3 for delayed recall, 1 for 3-step command, 1 for intersecting pentagons. She is able to draw a clock but could not put hands in. Cranial nerves: CN I: not tested CN II: pupils equal, round and reactive to light, inconsistent but appeared to have visual extinction to double  stimulation on the left lower quadrant. fundi unremarkable. CN III, IV, VI:  full range of motion, no nystagmus, no ptosis CN V: facial sensation intact CN VII: upper and lower face symmetric CN VIII: hearing intact to finger rub CN IX, X: gag intact, uvula midline CN XI: sternocleidomastoid and trapezius muscles intact CN XII: tongue midline Bulk & Tone: normal, no fasciculations. Motor: 5/5 throughout with no pronator drift. Sensation: intact to light touch, cold, pin, vibration and joint position sense.  No extinction to double simultaneous stimulation.  Romberg test negative Deep Tendon Reflexes: +2 throughout, no ankle clonus Plantar responses: downgoing bilaterally Cerebellar: no incoordination on finger to nose, heel to shin. No dysdiadochokinesia Gait: narrow-based and steady, able to tandem walk adequately. Tremor: none  IMPRESSION: This is a 73 year old right-handed woman with a history of hypertension and hyperlipidemia, presenting for evaluation of memory loss. 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  An MRI brain with and without contrast will be ordered to assess for underlying structural abnormality.  She will follow-up after the MRI.   Thank you for allowing me to participate in the care of this patient. Please do not hesitate to call for any questions or concerns.   Ellouise Newer, M.D.

## 2013-08-16 NOTE — Patient Instructions (Addendum)
1. MRI brain with and without contrast

## 2013-08-21 ENCOUNTER — Encounter: Payer: Self-pay | Admitting: Neurology

## 2013-08-27 ENCOUNTER — Other Ambulatory Visit: Payer: Self-pay | Admitting: Neurology

## 2013-08-27 ENCOUNTER — Ambulatory Visit (HOSPITAL_COMMUNITY)
Admission: RE | Admit: 2013-08-27 | Discharge: 2013-08-27 | Disposition: A | Discharge status: Home / Self Care | Payer: Medicare Other | Source: Ambulatory Visit | Attending: Neurology | Admitting: Neurology

## 2013-08-27 DIAGNOSIS — R480 Dyslexia and alexia: Secondary | ICD-10-CM

## 2013-08-27 DIAGNOSIS — R413 Other amnesia: Secondary | ICD-10-CM | POA: Insufficient documentation

## 2013-08-27 LAB — CREATININE, SERUM
Creatinine, Ser: 1 mg/dL (ref 0.50–1.10)
GFR calc Af Amer: 64 mL/min — ABNORMAL LOW (ref >90)
GFR calc non Af Amer: 55 mL/min — ABNORMAL LOW (ref >90)

## 2013-08-29 ENCOUNTER — Ambulatory Visit (INDEPENDENT_AMBULATORY_CARE_PROVIDER_SITE_OTHER): Payer: Medicare Other | Admitting: Neurology

## 2013-08-29 ENCOUNTER — Encounter: Payer: Self-pay | Admitting: Neurology

## 2013-08-29 VITALS — BP 146/80 | HR 88 | Resp 14 | Ht 65.0 in | Wt 121.8 lb

## 2013-08-29 DIAGNOSIS — R413 Other amnesia: Secondary | ICD-10-CM

## 2013-08-29 MED ORDER — DONEPEZIL HCL 10 MG PO TABS: ORAL_TABLET | ORAL | Status: DC

## 2013-08-29 NOTE — Progress Notes (Signed)
NEUROLOGY FOLLOW UP OFFICE NOTE  Beth Wilkins [id number]  HISTORY OF PRESENT ILLNESS: I had the pleasure of seeing Beth Wilkins in follow-up in the neurology clinic on 08/29/2013.  The patient was last seen 2 weeks ago for memory loss and returns to discuss MRI findings with her daughter and son.  Records and images were personally reviewed and reviewed with her family.  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  Interestingly, her MRI brain does not show any evidence of focal lesions such as mass or infarct.  There is moderate generalized atrophy that appears more significant over the left temporal and occipital lobes, however images are slightly tilted.    HPI:  This is a 73 yo RH woman with a history of hypertension and hyperlipidemia with memory loss that her daughter started to notice in September 2013. Her daughter is a Catering manager and was away for 2 months in 2013, when she got home in September 2013, their house was "ridiculously hot" over 90 degrees. Her mother did not seem concerned about it and stated the [facility] was not working, however her daughter became concerned that this was not addressed by the patient. She had fallen in 01/2013 and sustained a pelvic fracture. Since then, her daughter started noticing short-term memory problems, asking repeated questions, with focusing difficulties, worse with stress or when people are arguing around her, making her agitated. The patient reports that she would think of something then get tangled, taking a while to get her thoughts back. Her daughter has noticed that she can spell words but sometimes would not recognize them (she could not recognize if it was December or January written down). She reports that with prescription glasses, she was able to put words together. Her daughter is concerned that she has to practice writing a check beforehand, however the patient  reports that she has always done this. She has only missed paying one bill, her daughter reports she has been very good with this. She occasionally forgets to take her medications and uses a pillbox. Her family has not allowed her to drive anymore.  PAST MEDICAL HISTORY: Past Medical History  Diagnosis Date  . GERD (gastroesophageal reflux disease)   . Hypertension   . Hypercholesteremia   . Pelvis fracture 01/31/2013    MEDICATIONS: Current Outpatient Prescriptions on File Prior to Visit  Medication Sig Dispense Refill  . acetaminophen (TYLENOL) 325 MG tablet Take 2 tablets (650 mg total) by mouth every 6 (six) hours as needed for mild pain (or Fever >/= 101).      Marland Kitchen esomeprazole (NEXIUM) 40 MG capsule Take 40 mg by mouth daily at 12 noon.      . potassium chloride SA (K-DUR,KLOR-CON) 20 MEQ tablet Take 20 mEq by mouth daily.      . simvastatin (ZOCOR) 20 MG tablet Take 20 mg by mouth daily at 6 PM.      . traMADol (ULTRAM) 50 MG tablet Take 1 tablet (50 mg total) by mouth every 6 (six) hours as needed for severe pain.  30 tablet    . triamterene-hydrochlorothiazide (MAXZIDE-25) 37.5-25 MG per tablet Take 1 tablet by mouth daily.       No current facility-administered medications on file prior to visit.    ALLERGIES: No Known Allergies  FAMILY HISTORY: Family History  Problem Relation Age of Onset  . Diabetes Mellitus II Father   . Cervical cancer Other  SOCIAL HISTORY: History   Social History  . Marital Status: Legally Separated    Spouse Name: N/A    Number of Children: N/A  . Years of Education: N/A   Occupational History  . Not on file.   Social History Main Topics  . Smoking status: Current Some Day Smoker    Types: Cigarettes  . Smokeless tobacco: Never Used     Comment: smokes occasionally  . Alcohol Use: Yes     Comment: wine  . Drug Use: No  . Sexual Activity: Not on file   Other Topics Concern  . Not on file   Social History Narrative  . No  narrative on file    REVIEW OF SYSTEMS: Constitutional: No fevers, chills, or sweats, no generalized fatigue, change in appetite Eyes: No visual changes, double vision, eye pain Ear, nose and throat: No hearing loss, ear pain, nasal congestion, sore throat Cardiovascular: No chest pain, palpitations Respiratory:  No shortness of breath at rest or with exertion, wheezes GastrointestinaI: No nausea, vomiting, diarrhea, abdominal pain, fecal incontinence Genitourinary:  No dysuria, urinary retention or frequency Musculoskeletal:  No neck pain, back pain Integumentary: No rash, pruritus, skin lesions Neurological: as above Psychiatric: No depression, insomnia, anxiety Endocrine: No palpitations, fatigue, diaphoresis, mood swings, change in appetite, change in weight, increased thirst Hematologic/Lymphatic:  No anemia, purpura, petechiae. Allergic/Immunologic: no itchy/runny eyes, nasal congestion, recent allergic reactions, rashes  PHYSICAL EXAM: Filed Vitals:   08/29/13 0952  BP: 146/80  Pulse: 88  Resp: 14   General: No acute distress Head:  Normocephalic/atraumatic Neck: supple, no paraspinal tenderness, full range of motion Heart:  Regular rate and rhythm Lungs:  Clear to auscultation bilaterally Back: No paraspinal tenderness Skin/Extremities: No rash, no edema Neurological Exam: alert and oriented to person, place, does not know month or year. No aphasia or dysarthria. Fund of knowledge is appropriate.  Remote memory are intact.  Some decreased attention noted. Able to name objects and repeat phrases. Cranial nerves: Pupils equal, round, reactive to light.  Fundoscopic exam unremarkable, no papilledema. Extraocular movements intact with no nystagmus. Visual fields full however she is noted now consistently to have visual extinction to double simultaneous stimulation on the left upper and lower quadrants.  No simultanagnosia.  No clear optic ataxia or ocular apraxia. Facial  sensation intact. No facial asymmetry. Tongue, uvula, palate midline.  Motor: Bulk and tone normal, muscle strength 5/5 throughout with no pronator drift.  Sensation to light touch.  No extinction to double simultaneous stimulation.  Deep tendon reflexes 2+ throughout, toes downgoing.  Finger to nose testing intact.  Gait narrow-based and steady, able to tandem walk adequately.  Romberg negative.  IMPRESSION: This is a 73 yo RH woman with a history of hypertension and hyperlipidemia, with memory loss and visual deficits (left neglect).  Her MMSE this month was 19/30, MRI did not show any acute changes.  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  She will start Aricept with uptitration, side effects and expectations from the medication were discussed.  We also discussed the importance of long-term planning, brain stimulation and physical exercises for brain health.  She is not driving. She will follow-up in 6 months.  Thank you for allowing me to participate in her care.  Please do not hesitate to call for any questions or concerns.  The duration of this appointment visit was 25 minutes of face-to-face time with the patient.  Greater than 50% of this time was spent in  counseling, explanation of diagnosis, planning of further management, and coordination of care.   Beth Wilkins, M.D.   CC: Dr. Maxwell Caul

## 2013-08-29 NOTE — Patient Instructions (Signed)
1. Start Aricept 10mg : Take 1/2 tab daily for 1 week, then increase to 1 tab daily 2. Brain stimulation and physical exercises are important for brain health 3. Follow-up in 6 months

## 2013-12-10 ENCOUNTER — Telehealth: Payer: Self-pay | Admitting: Neurology

## 2013-12-10 NOTE — Telephone Encounter (Signed)
Returned call & spoke with pts daughter. She was filling pts pill organizer and dropped bottle of Aricept in the toilet. She called the pharmacy for a refill & was told she needed to call our office for ok of early refill since pt wasn't due yet. Told her I would call pharmacy to ok refill.  I did call pharmacy and ok'd refill on Aricept. Pharmacist did run it through insurance and ins will cover Rx under lost Rx refill.

## 2013-12-10 NOTE — Telephone Encounter (Signed)
Daughter - Lorriane Shire called, says she knocked one of the pt's meds over into the toilet. Will need refill. Unable to catch the name of the med. Message was left on our voicemail. CB# [phone] / Sherri S.

## 2014-01-16 ENCOUNTER — Other Ambulatory Visit (HOSPITAL_COMMUNITY): Payer: Self-pay | Admitting: Internal Medicine

## 2014-01-16 DIAGNOSIS — Z1231 Encounter for screening mammogram for malignant neoplasm of breast: Secondary | ICD-10-CM

## 2014-02-14 ENCOUNTER — Ambulatory Visit (HOSPITAL_COMMUNITY)
Admission: RE | Admit: 2014-02-14 | Discharge: 2014-02-14 | Disposition: A | Discharge status: Home / Self Care | Payer: Medicare Other | Source: Ambulatory Visit | Attending: Internal Medicine | Admitting: Internal Medicine

## 2014-02-14 DIAGNOSIS — Z1231 Encounter for screening mammogram for malignant neoplasm of breast: Secondary | ICD-10-CM | POA: Insufficient documentation

## 2014-03-03 ENCOUNTER — Ambulatory Visit (INDEPENDENT_AMBULATORY_CARE_PROVIDER_SITE_OTHER): Payer: Medicare Other | Admitting: Neurology

## 2014-03-03 ENCOUNTER — Encounter: Payer: Self-pay | Admitting: Neurology

## 2014-03-03 VITALS — BP 138/80 | HR 67 | Resp 16 | Ht 65.0 in | Wt 125.0 lb

## 2014-03-03 DIAGNOSIS — F0390 Unspecified dementia without behavioral disturbance: Secondary | ICD-10-CM

## 2014-03-03 DIAGNOSIS — R413 Other amnesia: Secondary | ICD-10-CM

## 2014-03-03 DIAGNOSIS — F03B0 Unspecified dementia, moderate, without behavioral disturbance, psychotic disturbance, mood disturbance, and anxiety: Secondary | ICD-10-CM

## 2014-03-03 DIAGNOSIS — R480 Dyslexia and alexia: Secondary | ICD-10-CM

## 2014-03-03 MED ORDER — MEMANTINE HCL 10 MG PO TABS: ORAL_TABLET | ORAL | Status: DC

## 2014-03-03 MED ORDER — DONEPEZIL HCL 10 MG PO TABS: ORAL_TABLET | ORAL | Status: DC

## 2014-03-03 NOTE — Patient Instructions (Addendum)
1. Start Namenda 10mg : Take 1 tablet daily for 1 week, then increase to 1 tablet twice a day 2. Continue Aricept 10mg  daily 3. Continue physical exercise and brain stimulation exercises for brain health 4. Follow-up in 6 months

## 2014-03-03 NOTE — Progress Notes (Signed)
NEUROLOGY FOLLOW UP OFFICE NOTE  Beth Wilkins [id number]  HISTORY OF PRESENT ILLNESS: I had the pleasure of seeing Beth Wilkins in follow-up in the neurology clinic on 03/03/2014.  The patient was last seen 6 months ago for worsening memory loss. She is again accompanied by her daughter who helps supplement the history today. On her last visit, MMSE was 19/30, she started Aricept, which she is tolerating well without side effects. Her daughter reports she is better, more in part due to how they interact and react with each other. Ms. Thall reports that she sometimes has difficulty with memory and is slow to think. Her daughter has been administering her medications. She does not drive. She cooks and needs to be monitored. She has stopped reading. Her daughter is concerned about depression and lack of interest in activities.  HPI: This is a 73 yo RH woman with a history of hypertension and hyperlipidemia with memory loss that her daughter started to notice in September 2013. Her daughter is a Catering manager and was away for 2 months in 2013, when she got home in September 2013, their house was "ridiculously hot" over 90 degrees. Her mother did not seem concerned about it and stated the [facility] was not working, however her daughter became concerned that this was not addressed by the patient. She had fallen in 01/2013 and sustained a pelvic fracture. Since then, her daughter started noticing short-term memory problems, asking repeated questions, with focusing difficulties, worse with stress or when people are arguing around her, making her agitated. The patient reports that she would think of something then get tangled, taking a while to get her thoughts back. Her daughter has noticed that she can spell words but sometimes would not recognize them (she could not recognize if it was December or January written down). She reports that with prescription glasses, she was able to put words together. Her  daughter is concerned that she has to practice writing a check beforehand, however the patient reports that she has always done this. She has only missed paying one bill, her daughter reports she has been very good with this. She occasionally forgets to take her medications and uses a pillbox. Her family has not allowed her to drive anymore.  Diagnostic Data: MRI brain does not show any evidence of focal lesions such as mass or infarct. There is moderate generalized atrophy that appears more significant over the left temporal and occipital lobes, however images are slightly tilted.   PAST MEDICAL HISTORY: Past Medical History  Diagnosis Date  . GERD (gastroesophageal reflux disease)   . Hypertension   . Hypercholesteremia   . Pelvis fracture 01/31/2013    MEDICATIONS: Current Outpatient Prescriptions on File Prior to Visit  Medication Sig Dispense Refill  . acetaminophen (TYLENOL) 325 MG tablet Take 2 tablets (650 mg total) by mouth every 6 (six) hours as needed for mild pain (or Fever >/= 101).    Marland Kitchen donepezil (ARICEPT) 10 MG tablet Take 1/2 tab daily for 1 week, then increase to 1 tab daily (Patient taking differently: Take 10 mg by mouth. ) 30 tablet 11  . esomeprazole (NEXIUM) 40 MG capsule Take 40 mg by mouth daily at 12 noon.    . potassium chloride SA (K-DUR,KLOR-CON) 20 MEQ tablet Take 20 mEq by mouth daily.    . simvastatin (ZOCOR) 20 MG tablet Take 20 mg by mouth daily at 6 PM.    . traMADol (ULTRAM) 50 MG tablet Take 1 tablet (  50 mg total) by mouth every 6 (six) hours as needed for severe pain. 30 tablet   . triamterene-hydrochlorothiazide (MAXZIDE-25) 37.5-25 MG per tablet Take 1 tablet by mouth daily.     No current facility-administered medications on file prior to visit.    ALLERGIES: No Known Allergies  FAMILY HISTORY: Family History  Problem Relation Age of Onset  . Diabetes Mellitus II Father   . Cervical cancer Other     SOCIAL HISTORY: History   Social  History  . Marital Status: Legally Separated    Spouse Name: N/A    Number of Children: N/A  . Years of Education: N/A   Occupational History  . Not on file.   Social History Main Topics  . Smoking status: Current Some Day Smoker    Types: Cigarettes  . Smokeless tobacco: Never Used     Comment: smokes occasionally  . Alcohol Use: Yes     Comment: wine  . Drug Use: No  . Sexual Activity: Not on file   Other Topics Concern  . Not on file   Social History Narrative  . No narrative on file    REVIEW OF SYSTEMS: Constitutional: No fevers, chills, or sweats, no generalized fatigue, change in appetite Eyes: No visual changes, double vision, eye pain Ear, nose and throat: No hearing loss, ear pain, nasal congestion, sore throat Cardiovascular: No chest pain, palpitations Respiratory:  No shortness of breath at rest or with exertion, wheezes GastrointestinaI: No nausea, vomiting, diarrhea, abdominal pain, fecal incontinence Genitourinary:  No dysuria, urinary retention or frequency Musculoskeletal:  No neck pain, back pain Integumentary: No rash, pruritus, skin lesions Neurological: as above Psychiatric: No depression, insomnia, anxiety Endocrine: No palpitations, fatigue, diaphoresis, mood swings, change in appetite, change in weight, increased thirst Hematologic/Lymphatic:  No anemia, purpura, petechiae. Allergic/Immunologic: no itchy/runny eyes, nasal congestion, recent allergic reactions, rashes  PHYSICAL EXAM: Filed Vitals:   03/03/14 0942  BP: 138/80  Pulse: 67  Resp: 16   General: No acute distress Head:  Normocephalic/atraumatic Neck: supple, no paraspinal tenderness, full range of motion Heart:  Regular rate and rhythm Lungs:  Clear to auscultation bilaterally Back: No paraspinal tenderness Skin/Extremities: No rash, no edema Neurological Exam: alert and oriented to person, place, does not know month or year. No aphasia or dysarthria. Fund of knowledge is  appropriate. Remote memory are intact. Some decreased attention noted. Able to name objects and repeat phrases. She is unable to read, she would say each letter in a word but unable to say the word. MMSE - Mini Mental State Exam 03/03/2014  Orientation to time 0  Orientation to Place 3  Registration 3  Attention/ Calculation 0  Recall 0  Language- name 2 objects 2  Language- repeat 1  Language- follow 3 step command 1  Language- read & follow direction 0  Write a sentence 1  Copy design 0  Total score 11   Cranial nerves: Pupils equal, round, reactive to light. Fundoscopic exam unremarkable, no papilledema. Extraocular movements intact with no nystagmus. Visual fields full however she is again noted to have extinction to double simultaneous stimulation on the left upper and lower quadrants. Facial sensation intact. No facial asymmetry. Tongue, uvula, palate midline. Motor: Bulk and tone normal, muscle strength 5/5 throughout with no pronator drift. Sensation to light touch. No extinction to double simultaneous stimulation. Deep tendon reflexes 2+ throughout, toes downgoing. Finger to nose testing intact. Gait narrow-based and steady, able to tandem walk adequately. Romberg negative.  IMPRESSION: This is a 73 yo RH woman with a history of hypertension and hyperlipidemia, with memory loss and visual deficits (left neglect) and alexia, MMSE today 11/30 (19/30 in June 2015). 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 We discussed addition of Namenda to Aricept, side effects were discussed. She will start Namenda 10mg  daily x 1 week, then increase to 10mg  BID. Her daughter is concerned about mood, we will consider starting an anti-depressant on her next visit, they will also discuss this with her PCP. We again discussed the importance of long-term planning, home safety, brain stimulation and physical  exercises for brain health. She is not driving. She will follow-up in 6 months.  Thank you for allowing me to participate in her care.  Please do not hesitate to call for any questions or concerns.  The duration of this appointment visit was 25 minutes of face-to-face time with the patient.  Greater than 50% of this time was spent in counseling, explanation of diagnosis, planning of further management, and coordination of care.   Ellouise Newer, M.D.   CC: Dr. Maxwell Caul

## 2014-03-05 ENCOUNTER — Encounter: Payer: Self-pay | Admitting: Neurology

## 2014-05-12 ENCOUNTER — Other Ambulatory Visit: Payer: Self-pay | Admitting: Nurse Practitioner

## 2014-05-12 ENCOUNTER — Ambulatory Visit
Admission: RE | Admit: 2014-05-12 | Discharge: 2014-05-12 | Disposition: A | Discharge status: Home / Self Care | Payer: Self-pay | Source: Ambulatory Visit | Attending: Nurse Practitioner | Admitting: Nurse Practitioner

## 2014-05-12 DIAGNOSIS — R140 Abdominal distension (gaseous): Secondary | ICD-10-CM

## 2014-05-13 ENCOUNTER — Encounter: Payer: Self-pay | Admitting: Neurology

## 2014-07-08 ENCOUNTER — Encounter: Payer: Self-pay | Admitting: Neurology

## 2014-07-09 ENCOUNTER — Telehealth: Payer: Self-pay | Admitting: Family Medicine

## 2014-07-09 ENCOUNTER — Encounter: Payer: Self-pay | Admitting: Neurology

## 2014-07-09 NOTE — Telephone Encounter (Signed)
Called her to let her know that letter was ready that she requested in reference to her mother's care. Left up front for p/u.   FYI: Dr. Delice Lesch she wanted to make you aware that she has stopped given patient the [place]. She states she had a hallucinogenic episode last month where they had to call EMS, so she has not given her anymore of the Namenda patient is still taking the Aricept. They will see you next month for her f/u.

## 2014-07-09 NOTE — Telephone Encounter (Signed)
Noted, thanks!

## 2014-09-02 ENCOUNTER — Ambulatory Visit (INDEPENDENT_AMBULATORY_CARE_PROVIDER_SITE_OTHER): Payer: Medicare Other | Admitting: Neurology

## 2014-09-02 ENCOUNTER — Encounter: Payer: Self-pay | Admitting: Neurology

## 2014-09-02 VITALS — BP 130/84 | HR 69 | Resp 16 | Ht 65.0 in | Wt 120.0 lb

## 2014-09-02 DIAGNOSIS — R413 Other amnesia: Secondary | ICD-10-CM | POA: Diagnosis not present

## 2014-09-02 DIAGNOSIS — F0390 Unspecified dementia without behavioral disturbance: Secondary | ICD-10-CM

## 2014-09-02 DIAGNOSIS — F03B0 Unspecified dementia, moderate, without behavioral disturbance, psychotic disturbance, mood disturbance, and anxiety: Secondary | ICD-10-CM | POA: Insufficient documentation

## 2014-09-02 MED ORDER — DONEPEZIL HCL 10 MG PO TABS: ORAL_TABLET | ORAL | Status: DC

## 2014-09-02 NOTE — Progress Notes (Signed)
NEUROLOGY FOLLOW UP OFFICE NOTE  Beth Wilkins [id number]  HISTORY OF PRESENT ILLNESS: I had the pleasure of seeing Beth Wilkins in follow-up in the neurology clinic on 09/02/2014.  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 MMSE in December 2015 was 11/30. She is again accompanied by her daughter who helps supplement the history today. Her daughter reports that her memory and personality is better, she attributes this with cutting out people in her life "bringing drama in." She has also started her mother on different supplements, including gingko biloba. She stopped the Namenda due to concern that it caused hallucinations of sinking into the bed, however at that time she also had a GI bug with abdominal pain, incoherent speech, she fell into the tub and was brought to the ER and diagnosed with dehydration. She had reported headaches initially with Aricept, but since taking this at night, headaches have stopped. She denies any dizziness, diplopia, focal numbness/tingling/weakness. She reports her memory "depends on if I am interested, otherwise I just let it go." Since her fall, her daughter has been helping her into the tub, otherwise no difficulties dressing herself. Her daughter makes her food daily and gives her the medications. She is with her mother at home as primary caregiver.   HPI: This is a 74 yo RH woman with a history of hypertension and hyperlipidemia with memory loss that her daughter started to notice in September 2013. Her daughter is a Catering manager and was away for 2 months in 2013, when she got home in September 2013, their house was "ridiculously hot" over 90 degrees. Her mother did not seem concerned about it and stated the [facility] was not working, however her daughter became concerned that this was not addressed by the patient. She had fallen in 01/2013 and sustained a  pelvic fracture. Since then, her daughter started noticing short-term memory problems, asking repeated questions, with focusing difficulties, worse with stress or when people are arguing around her, making her agitated. The patient reports that she would think of something then get tangled, taking a while to get her thoughts back. Her daughter has noticed that she can spell words but sometimes would not recognize them (she could not recognize if it was December or January written down). She reports that with prescription glasses, she was able to put words together. Her daughter is concerned that she has to practice writing a check beforehand, however the patient reports that she has always done this. She has only missed paying one bill, her daughter reports she has been very good with this. She occasionally forgets to take her medications and uses a pillbox. Her family has not allowed her to drive anymore.  Diagnostic Data: MRI brain does not show any evidence of focal lesions such as mass or infarct. There is moderate generalized atrophy that appears more significant over the left temporal and occipital lobes, however images are slightly tilted.   PAST MEDICAL HISTORY: Past Medical History  Diagnosis Date  . GERD (gastroesophageal reflux disease)   . Hypertension   . Hypercholesteremia   . Pelvis fracture 01/31/2013    MEDICATIONS: Current Outpatient Prescriptions on File Prior to Visit  Medication Sig Dispense Refill  . acetaminophen (TYLENOL) 325 MG tablet Take 2 tablets (650 mg total) by mouth every 6 (six) hours as needed for mild pain (or Fever >/= 101).    Marland Kitchen donepezil (ARICEPT) 10  MG tablet Take 1 tab daily 90 tablet 3  . esomeprazole (NEXIUM) 40 MG capsule Take 40 mg by mouth daily at 12 noon.    . potassium chloride SA (K-DUR,KLOR-CON) 20 MEQ tablet Take 20 mEq by mouth daily.    . simvastatin (ZOCOR) 20 MG tablet Take 20 mg by mouth daily at 6 PM.    . traMADol (ULTRAM) 50 MG tablet  Take 1 tablet (50 mg total) by mouth every 6 (six) hours as needed for severe pain. 30 tablet   . triamterene-hydrochlorothiazide (MAXZIDE-25) 37.5-25 MG per tablet Take 1 tablet by mouth daily.     No current facility-administered medications on file prior to visit.    ALLERGIES: No Known Allergies  FAMILY HISTORY: Family History  Problem Relation Age of Onset  . Diabetes Mellitus II Father   . Cervical cancer Other     SOCIAL HISTORY: History   Social History  . Marital Status: Legally Separated    Spouse Name: N/A  . Number of Children: N/A  . Years of Education: N/A   Occupational History  . Not on file.   Social History Main Topics  . Smoking status: Current Some Day Smoker    Types: Cigarettes  . Smokeless tobacco: Never Used     Comment: smokes occasionally  . Alcohol Use: 0.0 oz/week    0 Standard drinks or equivalent per week     Comment: wine  . Drug Use: No  . Sexual Activity: Not on file   Other Topics Concern  . Not on file   Social History Narrative    REVIEW OF SYSTEMS: Constitutional: No fevers, chills, or sweats, no generalized fatigue, change in appetite Eyes: No visual changes, double vision, eye pain Ear, nose and throat: No hearing loss, ear pain, nasal congestion, sore throat Cardiovascular: No chest pain, palpitations Respiratory:  No shortness of breath at rest or with exertion, wheezes GastrointestinaI: No nausea, vomiting, diarrhea, abdominal pain, fecal incontinence Genitourinary:  No dysuria, urinary retention or frequency Musculoskeletal:  No neck pain, back pain Integumentary: No rash, pruritus, skin lesions Neurological: as above Psychiatric: No depression, insomnia, anxiety Endocrine: No palpitations, fatigue, diaphoresis, mood swings, change in appetite, change in weight, increased thirst Hematologic/Lymphatic:  No anemia, purpura, petechiae. Allergic/Immunologic: no itchy/runny eyes, nasal congestion, recent allergic  reactions, rashes  PHYSICAL EXAM: Filed Vitals:   09/02/14 0949  BP: 130/84  Pulse: 69  Resp: 16   General: No acute distress Head:  Normocephalic/atraumatic Neck: supple, no paraspinal tenderness, full range of motion Heart:  Regular rate and rhythm Lungs:  Clear to auscultation bilaterally Back: No paraspinal tenderness Skin/Extremities: No rash, no edema Neurological Exam: alert and oriented to person, place, does not know month or year. No aphasia or dysarthria. Fund of knowledge is appropriate. Remote memory intact. Attention reduced. Able to name objects and repeat phrases. She is unable to read, she would say each letter in a word but unable to say the word (similar to prior). She started having dry heaves after asked to read and draw clock.  MMSE - Mini Mental State Exam 09/02/2014 03/03/2014  Orientation to time 0 0  Orientation to Place 5 3  Registration 3 3  Attention/ Calculation 0 0  Recall 0 0  Language- name 2 objects 2 2  Language- repeat 1 1  Language- follow 3 step command 2 1  Language- read & follow direction 0 0  Write a sentence 0 1  Copy design 0 0  Total score  13 11   Cranial nerves: Pupils equal, round, reactive to light. Fundoscopic exam unremarkable, no papilledema. Extraocular movements intact with no nystagmus. Visual fields full. Facial sensation intact. No facial asymmetry. Tongue, uvula, palate midline. Motor: Bulk and tone normal, muscle strength 5/5 throughout with no pronator drift. Sensation to light touch. No extinction to double simultaneous stimulation. Deep tendon reflexes 2+ throughout, toes downgoing. Finger to nose testing intact. Gait narrow-based and steady, able to tandem walk adequately. Romberg negative.  IMPRESSION: This is a 74 yo RH woman with a history of hypertension and hyperlipidemia, with memory loss and visual deficits (left neglect) and alexia, MMSE today 13/30 (11/30 in December 2015, 19/30 in June 2015). 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 She did not tolerate addition of Namenda. Continue Aricept 10mg  daily. Her daughter reports mood and personality are better since she started vitamin supplements. We again discussed physical exercise and brain stimulation exercises, she has difficulty reading, her daughter has been buying her more magazines with pictures and bigger words. We again discussed the importance of long-term planning, home safety, 24/7 care. She is not driving. She will follow-up in 1 year or earlier if needed.   Thank you for allowing me to participate in her care.  Please do not hesitate to call for any questions or concerns.  The duration of this appointment visit was 15 minutes of face-to-face time with the patient.  Greater than 50% of this time was spent in counseling, explanation of diagnosis, planning of further management, and coordination of care.   Beth Wilkins, M.D.   CC: Dr. Maxwell Caul

## 2014-09-02 NOTE — Patient Instructions (Signed)
1. Continue Aricept 10mg  daily 2. Continue physical exercise and brain stimulation exercises for brain health 3. Follow-up in 1 year

## 2014-09-03 ENCOUNTER — Other Ambulatory Visit: Payer: Self-pay | Admitting: Neurology

## 2014-09-03 NOTE — Telephone Encounter (Signed)
Rx sent 

## 2014-09-07 ENCOUNTER — Other Ambulatory Visit: Payer: Self-pay | Admitting: Neurology

## 2014-09-11 ENCOUNTER — Other Ambulatory Visit: Payer: Self-pay | Admitting: Neurology

## 2014-09-11 NOTE — Telephone Encounter (Signed)
Pt daughter called and wanted to check the status please call [phone] pt daughter name is vanessa

## 2014-09-11 NOTE — Telephone Encounter (Signed)
Spoke with [facility] regarding patients Aricept refill.   Per pharmacy patient has refills.

## 2014-09-11 NOTE — Telephone Encounter (Signed)
Patient daughter Lorriane Shire informed that she has refills of Aricept.

## 2015-01-06 DIAGNOSIS — K56609 Unspecified intestinal obstruction, unspecified as to partial versus complete obstruction: Secondary | ICD-10-CM

## 2015-01-06 HISTORY — DX: Unspecified intestinal obstruction, unspecified as to partial versus complete obstruction: K56.609

## 2015-01-13 ENCOUNTER — Other Ambulatory Visit: Payer: Self-pay | Admitting: Neurology

## 2015-01-20 ENCOUNTER — Emergency Department (HOSPITAL_COMMUNITY): Payer: Medicare Other

## 2015-01-20 ENCOUNTER — Inpatient Hospital Stay (HOSPITAL_COMMUNITY)
Admission: EM | Admit: 2015-01-20 | Discharge: 2015-01-30 | DRG: 329 | Disposition: A | Discharge status: Home / Self Care | Payer: Medicare Other | Attending: Internal Medicine | Admitting: Internal Medicine

## 2015-01-20 ENCOUNTER — Encounter (HOSPITAL_COMMUNITY): Payer: Self-pay | Admitting: Family Medicine

## 2015-01-20 DIAGNOSIS — E860 Dehydration: Secondary | ICD-10-CM | POA: Diagnosis present

## 2015-01-20 DIAGNOSIS — K7689 Other specified diseases of liver: Secondary | ICD-10-CM | POA: Diagnosis present

## 2015-01-20 DIAGNOSIS — F17210 Nicotine dependence, cigarettes, uncomplicated: Secondary | ICD-10-CM | POA: Diagnosis present

## 2015-01-20 DIAGNOSIS — C181 Malignant neoplasm of appendix: Secondary | ICD-10-CM

## 2015-01-20 DIAGNOSIS — Z888 Allergy status to other drugs, medicaments and biological substances status: Secondary | ICD-10-CM

## 2015-01-20 DIAGNOSIS — K5660 Unspecified intestinal obstruction: Secondary | ICD-10-CM | POA: Diagnosis present

## 2015-01-20 DIAGNOSIS — C180 Malignant neoplasm of cecum: Principal | ICD-10-CM | POA: Diagnosis present

## 2015-01-20 DIAGNOSIS — N1330 Unspecified hydronephrosis: Secondary | ICD-10-CM | POA: Diagnosis present

## 2015-01-20 DIAGNOSIS — K219 Gastro-esophageal reflux disease without esophagitis: Secondary | ICD-10-CM | POA: Diagnosis present

## 2015-01-20 DIAGNOSIS — I10 Essential (primary) hypertension: Secondary | ICD-10-CM | POA: Diagnosis present

## 2015-01-20 DIAGNOSIS — F03B0 Unspecified dementia, moderate, without behavioral disturbance, psychotic disturbance, mood disturbance, and anxiety: Secondary | ICD-10-CM | POA: Diagnosis present

## 2015-01-20 DIAGNOSIS — E876 Hypokalemia: Secondary | ICD-10-CM

## 2015-01-20 DIAGNOSIS — Z79899 Other long term (current) drug therapy: Secondary | ICD-10-CM

## 2015-01-20 DIAGNOSIS — R109 Unspecified abdominal pain: Secondary | ICD-10-CM

## 2015-01-20 DIAGNOSIS — D649 Anemia, unspecified: Secondary | ICD-10-CM | POA: Diagnosis present

## 2015-01-20 DIAGNOSIS — Z8049 Family history of malignant neoplasm of other genital organs: Secondary | ICD-10-CM

## 2015-01-20 DIAGNOSIS — Z833 Family history of diabetes mellitus: Secondary | ICD-10-CM

## 2015-01-20 DIAGNOSIS — Z885 Allergy status to narcotic agent status: Secondary | ICD-10-CM

## 2015-01-20 DIAGNOSIS — R1110 Vomiting, unspecified: Secondary | ICD-10-CM

## 2015-01-20 DIAGNOSIS — D72829 Elevated white blood cell count, unspecified: Secondary | ICD-10-CM | POA: Diagnosis present

## 2015-01-20 DIAGNOSIS — N179 Acute kidney failure, unspecified: Secondary | ICD-10-CM | POA: Diagnosis present

## 2015-01-20 DIAGNOSIS — Z0189 Encounter for other specified special examinations: Secondary | ICD-10-CM

## 2015-01-20 DIAGNOSIS — K36 Other appendicitis: Secondary | ICD-10-CM | POA: Diagnosis present

## 2015-01-20 DIAGNOSIS — F0390 Unspecified dementia without behavioral disturbance: Secondary | ICD-10-CM | POA: Diagnosis present

## 2015-01-20 DIAGNOSIS — E785 Hyperlipidemia, unspecified: Secondary | ICD-10-CM | POA: Diagnosis present

## 2015-01-20 DIAGNOSIS — R140 Abdominal distension (gaseous): Secondary | ICD-10-CM

## 2015-01-20 DIAGNOSIS — K352 Acute appendicitis with generalized peritonitis: Secondary | ICD-10-CM | POA: Diagnosis present

## 2015-01-20 DIAGNOSIS — R112 Nausea with vomiting, unspecified: Secondary | ICD-10-CM | POA: Diagnosis not present

## 2015-01-20 DIAGNOSIS — K769 Liver disease, unspecified: Secondary | ICD-10-CM | POA: Diagnosis present

## 2015-01-20 DIAGNOSIS — K3532 Acute appendicitis with perforation and localized peritonitis, without abscess: Secondary | ICD-10-CM | POA: Insufficient documentation

## 2015-01-20 DIAGNOSIS — N200 Calculus of kidney: Secondary | ICD-10-CM | POA: Diagnosis present

## 2015-01-20 DIAGNOSIS — E7800 Pure hypercholesterolemia, unspecified: Secondary | ICD-10-CM | POA: Diagnosis present

## 2015-01-20 DIAGNOSIS — K56609 Unspecified intestinal obstruction, unspecified as to partial versus complete obstruction: Secondary | ICD-10-CM | POA: Diagnosis present

## 2015-01-20 LAB — CBC WITH DIFFERENTIAL/PLATELET
BASOS ABS: 0 10*3/uL (ref 0.0–0.1)
BASOS PCT: 0 %
Eosinophils Absolute: 0 10*3/uL (ref 0.0–0.7)
Eosinophils Relative: 0 %
HEMATOCRIT: 38.8 % (ref 36.0–46.0)
Hemoglobin: 13.5 g/dL (ref 12.0–15.0)
LYMPHS ABS: 2.3 10*3/uL (ref 0.7–4.0)
Lymphocytes Relative: 18 %
MCH: 29.2 pg (ref 26.0–34.0)
MCHC: 34.8 g/dL (ref 30.0–36.0)
MCV: 83.8 fL (ref 78.0–100.0)
MONOS PCT: 6 %
Monocytes Absolute: 0.8 10*3/uL (ref 0.1–1.0)
NEUTROS ABS: 9.8 10*3/uL — AB (ref 1.7–7.7)
Neutrophils Relative %: 76 %
Platelets: 319 10*3/uL (ref 150–400)
RBC: 4.63 MIL/uL (ref 3.87–5.11)
RDW: 14.5 % (ref 11.5–15.5)
WBC: 12.9 10*3/uL — ABNORMAL HIGH (ref 4.0–10.5)

## 2015-01-20 LAB — COMPREHENSIVE METABOLIC PANEL
ALBUMIN: 4.5 g/dL (ref 3.5–5.0)
ALT: 23 U/L (ref 14–54)
AST: 27 U/L (ref 15–41)
Alkaline Phosphatase: 74 U/L (ref 38–126)
Anion gap: 17 — ABNORMAL HIGH (ref 5–15)
BUN: 74 mg/dL — AB (ref 6–20)
CHLORIDE: 82 mmol/L — AB (ref 101–111)
CO2: 30 mmol/L (ref 22–32)
Calcium: 9.2 mg/dL (ref 8.9–10.3)
Creatinine, Ser: 2.37 mg/dL — ABNORMAL HIGH (ref 0.44–1.00)
GFR calc Af Amer: 22 mL/min — ABNORMAL LOW (ref >60)
GFR calc non Af Amer: 19 mL/min — ABNORMAL LOW (ref >60)
GLUCOSE: 122 mg/dL — AB (ref 65–99)
POTASSIUM: 3.7 mmol/L (ref 3.5–5.1)
Sodium: 129 mmol/L — ABNORMAL LOW (ref 135–145)
TOTAL PROTEIN: 7.9 g/dL (ref 6.5–8.1)
Total Bilirubin: 1.3 mg/dL — ABNORMAL HIGH (ref 0.3–1.2)

## 2015-01-20 LAB — URINALYSIS, ROUTINE W REFLEX MICROSCOPIC
GLUCOSE, UA: NEGATIVE mg/dL
Ketones, ur: NEGATIVE mg/dL
Nitrite: NEGATIVE
PH: 5 (ref 5.0–8.0)
Protein, ur: NEGATIVE mg/dL
SPECIFIC GRAVITY, URINE: 1.018 (ref 1.005–1.030)

## 2015-01-20 LAB — LIPASE, BLOOD: Lipase: 55 U/L — ABNORMAL HIGH (ref 11–51)

## 2015-01-20 LAB — URINE MICROSCOPIC-ADD ON: RBC / HPF: NONE SEEN RBC/hpf (ref 0–5)

## 2015-01-20 MED ORDER — SODIUM CHLORIDE 0.9 % IV BOLUS (SEPSIS)
1000.0000 mL | Freq: Once | INTRAVENOUS | Status: AC
  Administered 2015-01-20: 1000 mL via INTRAVENOUS

## 2015-01-20 NOTE — ED Provider Notes (Signed)
CSN: [id number]     Arrival date & time 01/20/15  1922 History   First MD Initiated Contact with Patient 01/20/15 2038     Chief Complaint  Patient presents with  . Dehydration     (Consider location/radiation/quality/duration/timing/severity/associated sxs/prior Treatment) HPI L5 caveat dementia history is obtained from patient's daughter who completes her. Patient's daughter reports the patient's abdomen has been distended for the past 4 days. 4 days ago she had "projectile vomiting. She had vomiting again this morning. She's not urinated since this morning. She was seen by Dr. Drema Dallas in the office this morning. Sent sent here for evaluation as there was concern for kidney failure. Patient denies complaint presently. She denies abdominal pain denies chest pain. Her daughter reports that she's had diminished appetite over the past week and has lost 9 pounds in the past 2 months. No other associated symptoms. Denies nausea at present. Last bowel movement this morning, normal Past Medical History  Diagnosis Date  . GERD (gastroesophageal reflux disease)   . Hypertension   . Hypercholesteremia   . Pelvis fracture ([place]) 01/31/2013  . Dementia    Past Surgical History  Procedure Laterality Date  . Cataract extraction, bilateral     Family History  Problem Relation Age of Onset  . Diabetes Mellitus II Father   . Cervical cancer Other    Social History  Substance Use Topics  . Smoking status: Current Every Day Smoker    Types: Cigarettes  . Smokeless tobacco: Never Used     Comment: smokes occasionally  . Alcohol Use: No   OB History    No data available     Review of Systems  Unable to perform ROS: Dementia  Constitutional: Positive for appetite change and unexpected weight change.  Gastrointestinal: Positive for vomiting.      Allergies  Review of patient's allergies indicates no known allergies.  Home Medications   Prior to Admission medications   Medication Sig  Start Date End Date Taking? Authorizing Provider  acetaminophen (TYLENOL) 325 MG tablet Take 2 tablets (650 mg total) by mouth every 6 (six) hours as needed for mild pain (or Fever >/= 101). 02/04/13   Carlena Sax, MD  Cholecalciferol (VITAMIN D3) 2000 UNITS TABS Take by mouth daily.    Historical Provider, MD  Coenzyme Q10 (CO Q 10) 100 MG CAPS Take by mouth daily.    Historical Provider, MD  donepezil (ARICEPT) 10 MG tablet Take 1 tab daily 09/02/14   Cameron Sprang, MD  donepezil (ARICEPT) 10 MG tablet TAKE 1 TAB DAILY 09/03/14   Cameron Sprang, MD  esomeprazole (NEXIUM) 40 MG capsule Take 40 mg by mouth daily at 12 noon.    Historical Provider, MD  Ginkgo Biloba (GINKOBA PO) Take 120 mg by mouth daily.    Historical Provider, MD  MULTIPLE VITAMIN PO Take by mouth daily.    Historical Provider, MD  potassium chloride SA (K-DUR,KLOR-CON) 20 MEQ tablet Take 20 mEq by mouth daily.    Historical Provider, MD  simvastatin (ZOCOR) 20 MG tablet Take 20 mg by mouth daily at 6 PM.    Historical Provider, MD  traMADol (ULTRAM) 50 MG tablet Take 1 tablet (50 mg total) by mouth every 6 (six) hours as needed for severe pain. 02/04/13   Carlena Sax, MD  triamterene-hydrochlorothiazide (MAXZIDE-25) 37.5-25 MG per tablet Take 1 tablet by mouth daily.    Historical Provider, MD  vitamin B-12 (CYANOCOBALAMIN) 1000 MCG tablet Take 1,000 mcg by mouth  daily.    Historical Provider, MD   BP 118/59 mmHg  Pulse 103  Temp(Src) 97.9 F (36.6 C) (Oral)  Resp 22  Ht 5\' 5"  (1.651 m)  Wt 121 lb 8 oz (55.112 kg)  BMI 20.22 kg/m2  SpO2 100% Physical Exam  Constitutional: No distress.  Chronically ill-appearing  HENT:  Head: Normocephalic and atraumatic.  Eyes: Conjunctivae are normal. Pupils are equal, round, and reactive to light.  Neck: Neck supple. No tracheal deviation present. No thyromegaly present.  Cardiovascular: Normal rate and regular rhythm.   No murmur heard. Pulmonary/Chest: Effort normal and breath  sounds normal.  Abdominal: Soft. Bowel sounds are normal. She exhibits distension. There is no tenderness.  Musculoskeletal: Normal range of motion. She exhibits no edema or tenderness.  Neurological: She is alert. Coordination normal.  Skin: Skin is warm and dry. No rash noted.  Psychiatric: She has a normal mood and affect.  Nursing note and vitals reviewed.   ED Course  Procedures (including critical care time) Labs Review Labs Reviewed - No data to display  Imaging Review No results found. I have personally reviewed and evaluated these images and lab results as part of my medical decision-making.   EKG Interpretation None      Pt signed out to Dr Betsey Holiday at 1240 am Results for orders placed or performed during the hospital encounter of 01/20/15  Comprehensive metabolic panel  Result Value Ref Range   Sodium 129 (L) 135 - 145 mmol/L   Potassium 3.7 3.5 - 5.1 mmol/L   Chloride 82 (L) 101 - 111 mmol/L   CO2 30 22 - 32 mmol/L   Glucose, Bld 122 (H) 65 - 99 mg/dL   BUN 74 (H) 6 - 20 mg/dL   Creatinine, Ser 2.37 (H) 0.44 - 1.00 mg/dL   Calcium 9.2 8.9 - 10.3 mg/dL   Total Protein 7.9 6.5 - 8.1 g/dL   Albumin 4.5 3.5 - 5.0 g/dL   AST 27 15 - 41 U/L   ALT 23 14 - 54 U/L   Alkaline Phosphatase 74 38 - 126 U/L   Total Bilirubin 1.3 (H) 0.3 - 1.2 mg/dL   GFR calc non Af Amer 19 (L) >60 mL/min   GFR calc Af Amer 22 (L) >60 mL/min   Anion gap 17 (H) 5 - 15  CBC with Differential/Platelet  Result Value Ref Range   WBC 12.9 (H) 4.0 - 10.5 K/uL   RBC 4.63 3.87 - 5.11 MIL/uL   Hemoglobin 13.5 12.0 - 15.0 g/dL   HCT 38.8 36.0 - 46.0 %   MCV 83.8 78.0 - 100.0 fL   MCH 29.2 26.0 - 34.0 pg   MCHC 34.8 30.0 - 36.0 g/dL   RDW 14.5 11.5 - 15.5 %   Platelets 319 150 - 400 K/uL   Neutrophils Relative % 76 %   Lymphocytes Relative 18 %   Monocytes Relative 6 %   Eosinophils Relative 0 %   Basophils Relative 0 %   Neutro Abs 9.8 (H) 1.7 - 7.7 K/uL   Lymphs Abs 2.3 0.7 - 4.0 K/uL    Monocytes Absolute 0.8 0.1 - 1.0 K/uL   Eosinophils Absolute 0.0 0.0 - 0.7 K/uL   Basophils Absolute 0.0 0.0 - 0.1 K/uL   RBC Morphology BURR CELLS   Lipase, blood  Result Value Ref Range   Lipase 55 (H) 11 - 51 U/L  Urinalysis, Routine w reflex microscopic (not at [facility])  Result Value Ref Range   Color,  Urine YELLOW YELLOW   APPearance CLOUDY (A) CLEAR   Specific Gravity, Urine 1.018 1.005 - 1.030   pH 5.0 5.0 - 8.0   Glucose, UA NEGATIVE NEGATIVE mg/dL   Hgb urine dipstick TRACE (A) NEGATIVE   Bilirubin Urine MODERATE (A) NEGATIVE   Ketones, ur NEGATIVE NEGATIVE mg/dL   Protein, ur NEGATIVE NEGATIVE mg/dL   Nitrite NEGATIVE NEGATIVE   Leukocytes, UA SMALL (A) NEGATIVE  Urine microscopic-add on  Result Value Ref Range   Squamous Epithelial / LPF TOO NUMEROUS TO COUNT (A) NONE SEEN   WBC, UA 0-5 0 - 5 WBC/hpf   RBC / HPF NONE SEEN 0 - 5 RBC/hpf   Bacteria, UA MANY (A) NONE SEEN   No results found.  MDM  Dx #1 acute kidney injury #2hyponatremia Final diagnoses:  None        Orlie Dakin, MD 01/21/15 [phone]

## 2015-01-20 NOTE — ED Notes (Signed)
Patient is having abd distention (pt's daughter reports she noticed this starting two weeks ago, lower back, nausea, and vomiting (projectile on Friday). Pt's daughter reports intermittently blood work has shown dehydration with change in lab work.

## 2015-01-20 NOTE — ED Notes (Signed)
RN will get labs.

## 2015-01-20 NOTE — ED Notes (Signed)
Pt, being sent by Little MD, c/o n/v and dehydration.  Earlier lab work resulted: BUN 68, Creatinine 2.14.

## 2015-01-21 ENCOUNTER — Inpatient Hospital Stay (HOSPITAL_COMMUNITY): Payer: Medicare Other

## 2015-01-21 ENCOUNTER — Emergency Department (HOSPITAL_COMMUNITY): Payer: Medicare Other

## 2015-01-21 ENCOUNTER — Encounter (HOSPITAL_COMMUNITY): Payer: Self-pay | Admitting: Internal Medicine

## 2015-01-21 DIAGNOSIS — K5660 Unspecified intestinal obstruction: Secondary | ICD-10-CM | POA: Diagnosis present

## 2015-01-21 DIAGNOSIS — E785 Hyperlipidemia, unspecified: Secondary | ICD-10-CM | POA: Diagnosis present

## 2015-01-21 DIAGNOSIS — Z888 Allergy status to other drugs, medicaments and biological substances status: Secondary | ICD-10-CM | POA: Diagnosis not present

## 2015-01-21 DIAGNOSIS — K5669 Other intestinal obstruction: Secondary | ICD-10-CM

## 2015-01-21 DIAGNOSIS — K219 Gastro-esophageal reflux disease without esophagitis: Secondary | ICD-10-CM | POA: Diagnosis present

## 2015-01-21 DIAGNOSIS — R112 Nausea with vomiting, unspecified: Secondary | ICD-10-CM | POA: Diagnosis present

## 2015-01-21 DIAGNOSIS — D649 Anemia, unspecified: Secondary | ICD-10-CM | POA: Diagnosis present

## 2015-01-21 DIAGNOSIS — D72829 Elevated white blood cell count, unspecified: Secondary | ICD-10-CM | POA: Diagnosis present

## 2015-01-21 DIAGNOSIS — K56609 Unspecified intestinal obstruction, unspecified as to partial versus complete obstruction: Secondary | ICD-10-CM | POA: Diagnosis present

## 2015-01-21 DIAGNOSIS — F0390 Unspecified dementia without behavioral disturbance: Secondary | ICD-10-CM | POA: Diagnosis not present

## 2015-01-21 DIAGNOSIS — N200 Calculus of kidney: Secondary | ICD-10-CM | POA: Diagnosis present

## 2015-01-21 DIAGNOSIS — N179 Acute kidney failure, unspecified: Secondary | ICD-10-CM | POA: Diagnosis present

## 2015-01-21 DIAGNOSIS — Z833 Family history of diabetes mellitus: Secondary | ICD-10-CM | POA: Diagnosis not present

## 2015-01-21 DIAGNOSIS — K769 Liver disease, unspecified: Secondary | ICD-10-CM | POA: Diagnosis present

## 2015-01-21 DIAGNOSIS — E860 Dehydration: Secondary | ICD-10-CM | POA: Diagnosis present

## 2015-01-21 DIAGNOSIS — I10 Essential (primary) hypertension: Secondary | ICD-10-CM | POA: Diagnosis not present

## 2015-01-21 DIAGNOSIS — Z79899 Other long term (current) drug therapy: Secondary | ICD-10-CM | POA: Diagnosis not present

## 2015-01-21 DIAGNOSIS — K352 Acute appendicitis with generalized peritonitis: Secondary | ICD-10-CM | POA: Diagnosis present

## 2015-01-21 DIAGNOSIS — Z885 Allergy status to narcotic agent status: Secondary | ICD-10-CM | POA: Diagnosis not present

## 2015-01-21 DIAGNOSIS — C180 Malignant neoplasm of cecum: Secondary | ICD-10-CM | POA: Diagnosis present

## 2015-01-21 DIAGNOSIS — N1330 Unspecified hydronephrosis: Secondary | ICD-10-CM | POA: Diagnosis present

## 2015-01-21 DIAGNOSIS — K7689 Other specified diseases of liver: Secondary | ICD-10-CM | POA: Diagnosis present

## 2015-01-21 DIAGNOSIS — E7800 Pure hypercholesterolemia, unspecified: Secondary | ICD-10-CM | POA: Diagnosis present

## 2015-01-21 DIAGNOSIS — Z8049 Family history of malignant neoplasm of other genital organs: Secondary | ICD-10-CM | POA: Diagnosis not present

## 2015-01-21 DIAGNOSIS — F17210 Nicotine dependence, cigarettes, uncomplicated: Secondary | ICD-10-CM | POA: Diagnosis present

## 2015-01-21 DIAGNOSIS — K36 Other appendicitis: Secondary | ICD-10-CM | POA: Diagnosis present

## 2015-01-21 DIAGNOSIS — E876 Hypokalemia: Secondary | ICD-10-CM | POA: Diagnosis present

## 2015-01-21 LAB — CBC WITH DIFFERENTIAL/PLATELET
BASOS ABS: 0 10*3/uL (ref 0.0–0.1)
BASOS PCT: 0 %
Eosinophils Absolute: 0 10*3/uL (ref 0.0–0.7)
Eosinophils Relative: 0 %
HEMATOCRIT: 34.7 % — AB (ref 36.0–46.0)
HEMOGLOBIN: 12 g/dL (ref 12.0–15.0)
LYMPHS PCT: 11 %
Lymphs Abs: 1.5 10*3/uL (ref 0.7–4.0)
MCH: 28.8 pg (ref 26.0–34.0)
MCHC: 34.6 g/dL (ref 30.0–36.0)
MCV: 83.4 fL (ref 78.0–100.0)
MONO ABS: 0.6 10*3/uL (ref 0.1–1.0)
MONOS PCT: 4 %
NEUTROS ABS: 11.5 10*3/uL — AB (ref 1.7–7.7)
Neutrophils Relative %: 85 %
Platelets: 298 10*3/uL (ref 150–400)
RBC: 4.16 MIL/uL (ref 3.87–5.11)
RDW: 14.5 % (ref 11.5–15.5)
WBC: 13.6 10*3/uL — ABNORMAL HIGH (ref 4.0–10.5)

## 2015-01-21 LAB — COMPREHENSIVE METABOLIC PANEL
ALBUMIN: 3.9 g/dL (ref 3.5–5.0)
ALK PHOS: 66 U/L (ref 38–126)
ALT: 22 U/L (ref 14–54)
ANION GAP: 17 — AB (ref 5–15)
AST: 25 U/L (ref 15–41)
BUN: 67 mg/dL — ABNORMAL HIGH (ref 6–20)
CALCIUM: 8.4 mg/dL — AB (ref 8.9–10.3)
CO2: 25 mmol/L (ref 22–32)
Chloride: 88 mmol/L — ABNORMAL LOW (ref 101–111)
Creatinine, Ser: 1.91 mg/dL — ABNORMAL HIGH (ref 0.44–1.00)
GFR calc Af Amer: 29 mL/min — ABNORMAL LOW (ref >60)
GFR calc non Af Amer: 25 mL/min — ABNORMAL LOW (ref >60)
GLUCOSE: 97 mg/dL (ref 65–99)
Potassium: 3.1 mmol/L — ABNORMAL LOW (ref 3.5–5.1)
SODIUM: 130 mmol/L — AB (ref 135–145)
Total Bilirubin: 1.4 mg/dL — ABNORMAL HIGH (ref 0.3–1.2)
Total Protein: 6.9 g/dL (ref 6.5–8.1)

## 2015-01-21 LAB — GLUCOSE, CAPILLARY
GLUCOSE-CAPILLARY: 91 mg/dL (ref 65–99)
GLUCOSE-CAPILLARY: 94 mg/dL (ref 65–99)
Glucose-Capillary: 92 mg/dL (ref 65–99)
Glucose-Capillary: 86 mg/dL (ref 65–99)

## 2015-01-21 MED ORDER — DIATRIZOATE MEGLUMINE & SODIUM 66-10 % PO SOLN
90.0000 mL | Freq: Once | ORAL | Status: AC
  Administered 2015-01-21: 90 mL via NASOGASTRIC
  Filled 2015-01-21: qty 90

## 2015-01-21 MED ORDER — SODIUM CHLORIDE 0.9 % IV SOLN: INTRAVENOUS | Status: DC

## 2015-01-21 MED ORDER — ONDANSETRON HCL 4 MG PO TABS: 4.0000 mg | ORAL_TABLET | Freq: Four times a day (QID) | ORAL | Status: DC | PRN

## 2015-01-21 MED ORDER — POTASSIUM CHLORIDE 10 MEQ/100ML IV SOLN
10.0000 meq | INTRAVENOUS | Status: AC
  Administered 2015-01-21 (×4): 10 meq via INTRAVENOUS
  Filled 2015-01-21 (×4): qty 100

## 2015-01-21 MED ORDER — ACETAMINOPHEN 325 MG PO TABS: 650.0000 mg | ORAL_TABLET | Freq: Four times a day (QID) | ORAL | Status: DC | PRN

## 2015-01-21 MED ORDER — SODIUM CHLORIDE 0.9 % IV SOLN
INTRAVENOUS | Status: AC
  Administered 2015-01-21: 07:00:00 via INTRAVENOUS
  Administered 2015-01-21: 1000 mL via INTRAVENOUS

## 2015-01-21 MED ORDER — HEPARIN SODIUM (PORCINE) 5000 UNIT/ML IJ SOLN
5000.0000 [IU] | Freq: Three times a day (TID) | INTRAMUSCULAR | Status: DC
  Administered 2015-01-21 – 2015-01-23 (×6): 5000 [IU] via SUBCUTANEOUS
  Filled 2015-01-21 (×7): qty 1

## 2015-01-21 MED ORDER — ONDANSETRON HCL 4 MG/2ML IJ SOLN: 4.0000 mg | Freq: Four times a day (QID) | INTRAMUSCULAR | Status: DC | PRN

## 2015-01-21 MED ORDER — PHENOL 1.4 % MT LIQD
1.0000 | OROMUCOSAL | Status: DC | PRN
  Administered 2015-01-22: 1 via OROMUCOSAL
  Filled 2015-01-21: qty 177

## 2015-01-21 MED ORDER — LIP MEDEX EX OINT
TOPICAL_OINTMENT | CUTANEOUS | Status: AC
  Administered 2015-01-21
  Filled 2015-01-21: qty 7

## 2015-01-21 MED ORDER — HYDRALAZINE HCL 20 MG/ML IJ SOLN: 10.0000 mg | INTRAMUSCULAR | Status: DC | PRN

## 2015-01-21 MED ORDER — ACETAMINOPHEN 650 MG RE SUPP: 650.0000 mg | Freq: Four times a day (QID) | RECTAL | Status: DC | PRN

## 2015-01-21 NOTE — ED Provider Notes (Signed)
Patient signed out to me by Dr. Cathleen Fears to follow-up on CT scan. Patient had been seen by primary doctor earlier today for evaluation of progressively worsening abdominal pain, abdominal distention and vomiting. Blood work in the office today showed acute kidney injury, was referred to the ER for further evaluation. Acute kidney injury has been confirmed, likely secondary to dehydration. Patient initiated on IV fluids. CT scan performed to show evidence of high-grade obstruction, unclear etiology.  Case discussed with Dr. Excell Seltzer, on-call for general surgery. Agrees with plan for NG tube, hydration, analgesia and patient will be seen first thing in the morning. No acute surgical intervention necessary at this time.  Patient will be admitted to the hospitalist service.  Orpah Greek, MD 01/21/15 [phone]

## 2015-01-21 NOTE — H&P (Signed)
[facility] History and Physical  Beth Wilkins [id number] DOB: [birth year] DOA: 01/20/2015  Referring physician: Ms. Deborah Chalk. PCP: Horton Finer, MD  Specialists: None.  Chief Complaint: Nausea vomiting.  History obtained from patient's daughter. Patient has dementia.  HPI: Beth Wilkins is a 74 y.o. female with history of hypertension, hyperlipidemia, dementia was brought to the ER after patient has been having multiple episodes of nausea vomiting. As per patient's daughter who provided the history patient has been having decreased appetite and abdominal distention for almost 2-3 weeks now. Over the last 2-3 days patient has been having projectile vomiting which has not improved and patient was brought to the ER. CT abdomen and pelvis shows high-grade small bowel obstruction and on-call surgeon Dr. Excell Seltzer was consulted by the ER physician and patient has been admitted for further management. Patient has been placed on NG tube suction following which large amounts of fluid has been suctioned. As per the daughter patient has not complained of any abdominal pain as such. Patient does not recall her last bowel movement. Denies any chest pain or shortness of breath. Has not had any previous abdominal surgery as per the patient's daughter.   Review of Systems: As presented in the history of presenting illness, rest negative.  Past Medical History  Diagnosis Date  . GERD (gastroesophageal reflux disease)   . Hypertension   . Hypercholesteremia   . Pelvis fracture ([place]) 01/31/2013  . Dementia    Past Surgical History  Procedure Laterality Date  . Cataract extraction, bilateral     Social History:  reports that she has been smoking Cigarettes.  She has never used smokeless tobacco. She reports that she does not drink alcohol or use illicit drugs. Where does patient live at home. Can patient participate in ADLs? Not sure.  Allergies  Allergen Reactions  . Namenda  [Memantine Hcl] Other (See Comments)    hallucinations  . Percocet [Oxycodone-Acetaminophen] Other (See Comments)    hallucinations    Family History:  Family History  Problem Relation Age of Onset  . Diabetes Mellitus II Father   . Cervical cancer Other       Prior to Admission medications   Medication Sig Start Date End Date Taking? Authorizing Provider  esomeprazole (NEXIUM) 40 MG capsule Take 40 mg by mouth daily at 12 noon.   Yes Historical Provider, MD  potassium chloride SA (K-DUR,KLOR-CON) 20 MEQ tablet Take 20 mEq by mouth daily.   Yes Historical Provider, MD  simvastatin (ZOCOR) 20 MG tablet Take 20 mg by mouth daily at 6 PM.   Yes Historical Provider, MD  triamterene-hydrochlorothiazide (MAXZIDE-25) 37.5-25 MG per tablet Take 1 tablet by mouth daily.   Yes Historical Provider, MD  acetaminophen (TYLENOL) 325 MG tablet Take 2 tablets (650 mg total) by mouth every 6 (six) hours as needed for mild pain (or Fever >/= 101). Patient not taking: Reported on 01/20/2015 02/04/13   Carlena Sax, MD  Cholecalciferol (VITAMIN D3) 2000 UNITS TABS Take by mouth daily.    Historical Provider, MD  Coenzyme Q10 (CO Q 10) 100 MG CAPS Take by mouth daily.    Historical Provider, MD  donepezil (ARICEPT) 10 MG tablet Take 1 tab daily Patient not taking: Reported on 01/20/2015 09/02/14   Cameron Sprang, MD  donepezil (ARICEPT) 10 MG tablet TAKE 1 TAB DAILY Patient not taking: Reported on 01/20/2015 09/03/14   Cameron Sprang, MD  Ginkgo Biloba (GINKOBA PO) Take 120 mg by mouth daily.  Historical Provider, MD  MULTIPLE VITAMIN PO Take by mouth daily.    Historical Provider, MD  traMADol (ULTRAM) 50 MG tablet Take 1 tablet (50 mg total) by mouth every 6 (six) hours as needed for severe pain. Patient not taking: Reported on 01/20/2015 02/04/13   Carlena Sax, MD  vitamin B-12 (CYANOCOBALAMIN) 1000 MCG tablet Take 1,000 mcg by mouth daily.    Historical Provider, MD    Physical Exam: Filed Vitals:    01/20/15 1925 01/20/15 1931 01/21/15 0037 01/21/15 0500  BP: 118/59  113/66 125/74  Pulse: 103  96 102  Temp: 97.9 F (36.6 C)     TempSrc: Oral     Resp: 22  16 31   Height:  5\' 5"  (1.651 m)    Weight:  55.112 kg (121 lb 8 oz)    SpO2: 100%  100% 100%     General:  Moderately built and poorly nourished.  Eyes: Anicteric no pallor.  ENT: No discharge from the ears eyes nose and mouth.  Neck: No mass felt.  Cardiovascular: S1 and S2 heard.  Respiratory: No rhonchi or crepitations.  Abdomen: Distended nontender bowel sounds not appreciated.  Skin: No rash.  Musculoskeletal: No edema.  Psychiatric: As dementia.  Neurologic: Alert and oriented to name. Moves all extremities.  Labs on Admission:  Basic Metabolic Panel:  Recent Labs Lab 01/20/15 2247  NA 129*  K 3.7  CL 82*  CO2 30  GLUCOSE 122*  BUN 74*  CREATININE 2.37*  CALCIUM 9.2   Liver Function Tests:  Recent Labs Lab 01/20/15 2247  AST 27  ALT 23  ALKPHOS 74  BILITOT 1.3*  PROT 7.9  ALBUMIN 4.5    Recent Labs Lab 01/20/15 2247  LIPASE 55*   No results for input(s): AMMONIA in the last 168 hours. CBC:  Recent Labs Lab 01/20/15 2247  WBC 12.9*  NEUTROABS 9.8*  HGB 13.5  HCT 38.8  MCV 83.8  PLT 319   Cardiac Enzymes: No results for input(s): CKTOTAL, CKMB, CKMBINDEX, TROPONINI in the last 168 hours.  BNP (last 3 results) No results for input(s): BNP in the last 8760 hours.  ProBNP (last 3 results) No results for input(s): PROBNP in the last 8760 hours.  CBG: No results for input(s): GLUCAP in the last 168 hours.  Radiological Exams on Admission: Ct Abdomen Pelvis Wo Contrast  01/21/2015  CLINICAL DATA:  Abdominal distention for 4 days with nausea and vomiting. Decreased urine output. EXAM: CT ABDOMEN AND PELVIS WITHOUT CONTRAST TECHNIQUE: Multidetector CT imaging of the abdomen and pelvis was performed following the standard protocol without IV contrast. COMPARISON:  None.  FINDINGS: Lower chest:  The included lung bases are clear. Liver: Ladder density lesion in the anterior subcapsular left lobe measures 3.6 cm and is consistent with simple cyst. Additional smaller hypodense lesions in the left and anterior right lobe are incompletely characterized without contrast. Hepatobiliary: Gallbladder physiologically distended, displaced laterally by dilated stomach. No calcified gallstone. Pancreas: Normal.  No ductal dilatation or surrounding inflammation. Spleen: Small soft tissue density in the left upper quadrant, likely diminutive spleen. Adrenal glands: No nodule. Kidneys: Multiple bilateral low-density lesions in both kidneys consistent with cysts. Mild right hydroureteronephrosis, distal ureter is suboptimally defined. There is an nonobstructing stone in the lower right kidney. No ureteral calculi. No left hydronephrosis. Stomach/Bowel: Stomach markedly distended with fluid. Diffuse fluid-filled dilated small bowel, measuring up to 4.1 cm. Transition point suspected in the right lower quadrant, however no definite decompressed small  bowel is seen. No pneumatosis. Small volume of stool throughout the colon without colonic wall thickening. The ileocecal valve not well-defined. The appendix is not definitively seen. Vascular/Lymphatic: No retroperitoneal adenopathy. Abdominal aorta is normal in caliber. Dense atherosclerosis of the abdominal aorta and its branches. Reproductive: Uterus appears in situ, suboptimally defined due to fluid-filled adjacent bowel loops. Limited assessment for adnexal mass. Bladder: Minimally distended, irregular/tubular in shape. No wall thickening. Displaced posteriorly due to dilated small bowel. Other: Mild mesenteric edema centrally.  No free air or free fluid. Musculoskeletal: There are no acute or suspicious osseous abnormalities. Advanced facet arthropathy in the lumbar spine. Transitional lumbosacral anatomy. IMPRESSION: 1. High-grade small bowel  obstruction with transition point in the right lower quadrant. No definite decompressed small bowel loops are seen, suspect distal ileum a site of obstruction. No perforation, pneumatosis or free fluid. 2. Right hydroureteronephrosis, however no obstructing lesion or stone. There is a nonobstructing stone in the lower right kidney. Multiple bilateral renal cysts. 3. Multiple liver lesions, largest represents a simple cyst. Smaller lesions are incompletely characterized without contrast. Electronically Signed   By: Jeb Levering M.D.   On: 01/21/2015 03:26    Assessment/Plan Active Problems:   HTN (hypertension)   Moderate dementia without behavioral disturbance   SBO (small bowel obstruction) (HCC)   ARF (acute renal failure) ([place])   1. Small bowel obstruction - patient has been placed on NG tube suction which will be continued. Gentle hydration. On-call surgeon Dr. Excell Seltzer has been consulted. Further recommendations per surgery. 2. Acute renal failure probably from dehydration and poor oral intake - continue hydration and closely follow metabolic panel intake output. 3. Right-sided hydronephrosis - no obstructing lesions were seen. Will need further workup as outpatient if renal function improves during this stay with hydration. 4. Multiple liver lesions in the CAT scan of the abdomen - further workup as outpatient. 5. Hypertension - since patient is nothing by mouth I have placed patient on when necessary IV hydralazine. 6. Hyperlipidemia - continue home medications once patient can take orally. 7. Dementia with no behavioral disturbances - continue home medications when patient intake orally. 8. Leukocytosis probably reactionary - patient is afebrile. Closely follow.  I have reviewed patient's old charts and labs.   DVT Prophylaxis SCDs in anticipation of possible procedures.  Code Status: Full code.  Family Communication: Patient's daughter who is also the healthcare power of  attorney.  Disposition Plan: Admit to inpatient.    Bianca Raneri N. [facility] Pager [phone].  If 7PM-7AM, please contact night-coverage [email or web address] Password [facility] 01/21/2015, 5:39 AM

## 2015-01-21 NOTE — Progress Notes (Signed)
[facility] Surgery Consult Note  Beth Wilkins Jan 05, 1941  [id number].    Requesting MD: Dr. Hal Hope Chief Complaint/Reason for Consult: SBO  HPI:  74 y/o AA female with PMH HTN/HLD, dementia, and GERD presented to [facility] with 2 week h/o nausea/vomiting, abdominal distension, limited bowel function, decreased urine output.  5 days ago she had projectile vomiting.  She asw Dr. Drema Dallas her PCP int his office since the symptoms persisted.  The PCP was concerned for kidney failure and thus she was referred to [facility] for further evaluation and managmeent.  She denies abdominal pain, CP/SOB, fever, diarrhea, constipation.  Has had 9lb weight loss over the past 2 months and anorexia.  Last BM unknown, but patient is usually very regular every morning.  Her dementia is an issue and she has been a bit confused which is why she has mitts on her hands.  Her daughter is at bedside able to recall the events over the last 2 weeks.  An NG tube has been placed.  No past surgical history, no hernias or abdominal infections.  No precipitating or alleviating factors, no radiating pains.     ROS: All systems reviewed and otherwise negative except for as above  Family History  Problem Relation Age of Onset  . Diabetes Mellitus II Father   . Cervical cancer Other     Past Medical History  Diagnosis Date  . GERD (gastroesophageal reflux disease)   . Hypertension   . Hypercholesteremia   . Pelvis fracture ([place]) 01/31/2013  . Dementia     Past Surgical History  Procedure Laterality Date  . Cataract extraction, bilateral      Social History:  reports that she has been smoking Cigarettes.  She has never used smokeless tobacco. She reports that she does not drink alcohol or use illicit drugs.  Allergies:  Allergies  Allergen Reactions  . Namenda [Memantine Hcl] Other (See Comments)    hallucinations  . Percocet [Oxycodone-Acetaminophen] Other (See Comments)    hallucinations     Medications Prior to Admission  Medication Sig Dispense Refill  . esomeprazole (NEXIUM) 40 MG capsule Take 40 mg by mouth daily at 12 noon.    . potassium chloride SA (K-DUR,KLOR-CON) 20 MEQ tablet Take 20 mEq by mouth daily.    . simvastatin (ZOCOR) 20 MG tablet Take 20 mg by mouth daily at 6 PM.    . triamterene-hydrochlorothiazide (MAXZIDE-25) 37.5-25 MG per tablet Take 1 tablet by mouth daily.    Marland Kitchen acetaminophen (TYLENOL) 325 MG tablet Take 2 tablets (650 mg total) by mouth every 6 (six) hours as needed for mild pain (or Fever >/= 101). (Patient not taking: Reported on 01/20/2015)    . Cholecalciferol (VITAMIN D3) 2000 UNITS TABS Take by mouth daily.    . Coenzyme Q10 (CO Q 10) 100 MG CAPS Take by mouth daily.    Marland Kitchen donepezil (ARICEPT) 10 MG tablet Take 1 tab daily (Patient not taking: Reported on 01/20/2015) 30 tablet 11  . donepezil (ARICEPT) 10 MG tablet TAKE 1 TAB DAILY (Patient not taking: Reported on 01/20/2015) 30 tablet 2  . Ginkgo Biloba (GINKOBA PO) Take 120 mg by mouth daily.    . MULTIPLE VITAMIN PO Take by mouth daily.    . traMADol (ULTRAM) 50 MG tablet Take 1 tablet (50 mg total) by mouth every 6 (six) hours as needed for severe pain. (Patient not taking: Reported on 01/20/2015) 30 tablet   . vitamin B-12 (CYANOCOBALAMIN) 1000 MCG tablet Take 1,000  mcg by mouth daily.      Blood pressure 128/68, pulse 116, temperature 97.9 F (36.6 C), temperature source Oral, resp. rate 24, height $RemoveBe'5\' 5"'HHRbOBxuJ$  (1.651 m), weight 55.157 kg (121 lb 9.6 oz), SpO2 97 %. Physical Exam: General: pleasant, WD/WN AA female who is laying in bed in NAD, mitts on hands HEENT: head is normocephalic, atraumatic.  Sclera are noninjected.  PERRL.  Ears and nose without any masses or lesions.  Mouth is pink and moist Heart: regular, rate, and rhythm.  No obvious murmurs, gallops, or rubs noted.  Palpable pedal pulses bilaterally Lungs: CTAB, no wheezes, rhonchi, or rales noted.  Respiratory effort  nonlabored Abd: soft, distended, non-tender, diminished BS, no masses, hernias, or organomegaly, no surgical scars MS: all 4 extremities are symmetrical with no cyanosis, clubbing, or edema. Skin: warm and dry with no masses, lesions, or rashes Psych: Awake and alert, some confusion, speech is fluent   Results for orders placed or performed during the hospital encounter of 01/20/15 (from the past 48 hour(s))  Urinalysis, Routine w reflex microscopic (not at [facility])     Status: Abnormal   Collection Time: 01/20/15 10:34 PM  Result Value Ref Range   Color, Urine YELLOW YELLOW   APPearance CLOUDY (A) CLEAR   Specific Gravity, Urine 1.018 1.005 - 1.030   pH 5.0 5.0 - 8.0   Glucose, UA NEGATIVE NEGATIVE mg/dL   Hgb urine dipstick TRACE (A) NEGATIVE   Bilirubin Urine MODERATE (A) NEGATIVE   Ketones, ur NEGATIVE NEGATIVE mg/dL   Protein, ur NEGATIVE NEGATIVE mg/dL   Nitrite NEGATIVE NEGATIVE   Leukocytes, UA SMALL (A) NEGATIVE  Urine microscopic-add on     Status: Abnormal   Collection Time: 01/20/15 10:34 PM  Result Value Ref Range   Squamous Epithelial / LPF TOO NUMEROUS TO COUNT (A) NONE SEEN    Comment: Please note change in reference range.   WBC, UA 0-5 0 - 5 WBC/hpf    Comment: Please note change in reference range.   RBC / HPF NONE SEEN 0 - 5 RBC/hpf    Comment: Please note change in reference range.   Bacteria, UA MANY (A) NONE SEEN    Comment: Please note change in reference range.  Comprehensive metabolic panel     Status: Abnormal   Collection Time: 01/20/15 10:47 PM  Result Value Ref Range   Sodium 129 (L) 135 - 145 mmol/L   Potassium 3.7 3.5 - 5.1 mmol/L   Chloride 82 (L) 101 - 111 mmol/L   CO2 30 22 - 32 mmol/L   Glucose, Bld 122 (H) 65 - 99 mg/dL   BUN 74 (H) 6 - 20 mg/dL   Creatinine, Ser 2.37 (H) 0.44 - 1.00 mg/dL   Calcium 9.2 8.9 - 10.3 mg/dL   Total Protein 7.9 6.5 - 8.1 g/dL   Albumin 4.5 3.5 - 5.0 g/dL   AST 27 15 - 41 U/L   ALT 23 14 - 54 U/L   Alkaline  Phosphatase 74 38 - 126 U/L   Total Bilirubin 1.3 (H) 0.3 - 1.2 mg/dL   GFR calc non Af Amer 19 (L) >60 mL/min   GFR calc Af Amer 22 (L) >60 mL/min    Comment: (NOTE) The eGFR has been calculated using the CKD EPI equation. This calculation has not been validated in all clinical situations. eGFR's persistently <60 mL/min signify possible Chronic Kidney Disease.    Anion gap 17 (H) 5 - 15  CBC with Differential/Platelet  Status: Abnormal   Collection Time: 01/20/15 10:47 PM  Result Value Ref Range   WBC 12.9 (H) 4.0 - 10.5 K/uL   RBC 4.63 3.87 - 5.11 MIL/uL   Hemoglobin 13.5 12.0 - 15.0 g/dL   HCT 38.8 36.0 - 46.0 %   MCV 83.8 78.0 - 100.0 fL   MCH 29.2 26.0 - 34.0 pg   MCHC 34.8 30.0 - 36.0 g/dL   RDW 14.5 11.5 - 15.5 %   Platelets 319 150 - 400 K/uL   Neutrophils Relative % 76 %   Lymphocytes Relative 18 %   Monocytes Relative 6 %   Eosinophils Relative 0 %   Basophils Relative 0 %   Neutro Abs 9.8 (H) 1.7 - 7.7 K/uL   Lymphs Abs 2.3 0.7 - 4.0 K/uL   Monocytes Absolute 0.8 0.1 - 1.0 K/uL   Eosinophils Absolute 0.0 0.0 - 0.7 K/uL   Basophils Absolute 0.0 0.0 - 0.1 K/uL   RBC Morphology BURR CELLS   Lipase, blood     Status: Abnormal   Collection Time: 01/20/15 10:47 PM  Result Value Ref Range   Lipase 55 (H) 11 - 51 U/L  Glucose, capillary     Status: None   Collection Time: 01/21/15  7:44 AM  Result Value Ref Range   Glucose-Capillary 91 65 - 99 mg/dL   Comment 1 Notify RN   Comprehensive metabolic panel     Status: Abnormal   Collection Time: 01/21/15  7:45 AM  Result Value Ref Range   Sodium 130 (L) 135 - 145 mmol/L   Potassium 3.1 (L) 3.5 - 5.1 mmol/L   Chloride 88 (L) 101 - 111 mmol/L   CO2 25 22 - 32 mmol/L   Glucose, Bld 97 65 - 99 mg/dL   BUN 67 (H) 6 - 20 mg/dL   Creatinine, Ser 1.91 (H) 0.44 - 1.00 mg/dL   Calcium 8.4 (L) 8.9 - 10.3 mg/dL   Total Protein 6.9 6.5 - 8.1 g/dL   Albumin 3.9 3.5 - 5.0 g/dL   AST 25 15 - 41 U/L   ALT 22 14 - 54 U/L    Alkaline Phosphatase 66 38 - 126 U/L   Total Bilirubin 1.4 (H) 0.3 - 1.2 mg/dL   GFR calc non Af Amer 25 (L) >60 mL/min   GFR calc Af Amer 29 (L) >60 mL/min    Comment: (NOTE) The eGFR has been calculated using the CKD EPI equation. This calculation has not been validated in all clinical situations. eGFR's persistently <60 mL/min signify possible Chronic Kidney Disease.    Anion gap 17 (H) 5 - 15  CBC WITH DIFFERENTIAL     Status: Abnormal   Collection Time: 01/21/15  7:45 AM  Result Value Ref Range   WBC 13.6 (H) 4.0 - 10.5 K/uL   RBC 4.16 3.87 - 5.11 MIL/uL   Hemoglobin 12.0 12.0 - 15.0 g/dL   HCT 34.7 (L) 36.0 - 46.0 %   MCV 83.4 78.0 - 100.0 fL   MCH 28.8 26.0 - 34.0 pg   MCHC 34.6 30.0 - 36.0 g/dL   RDW 14.5 11.5 - 15.5 %   Platelets 298 150 - 400 K/uL   Neutrophils Relative % 85 %   Neutro Abs 11.5 (H) 1.7 - 7.7 K/uL   Lymphocytes Relative 11 %   Lymphs Abs 1.5 0.7 - 4.0 K/uL   Monocytes Relative 4 %   Monocytes Absolute 0.6 0.1 - 1.0 K/uL   Eosinophils Relative 0 %  Eosinophils Absolute 0.0 0.0 - 0.7 K/uL   Basophils Relative 0 %   Basophils Absolute 0.0 0.0 - 0.1 K/uL   Ct Abdomen Pelvis Wo Contrast  01/21/2015  CLINICAL DATA:  Abdominal distention for 4 days with nausea and vomiting. Decreased urine output. EXAM: CT ABDOMEN AND PELVIS WITHOUT CONTRAST TECHNIQUE: Multidetector CT imaging of the abdomen and pelvis was performed following the standard protocol without IV contrast. COMPARISON:  None. FINDINGS: Lower chest:  The included lung bases are clear. Liver: Ladder density lesion in the anterior subcapsular left lobe measures 3.6 cm and is consistent with simple cyst. Additional smaller hypodense lesions in the left and anterior right lobe are incompletely characterized without contrast. Hepatobiliary: Gallbladder physiologically distended, displaced laterally by dilated stomach. No calcified gallstone. Pancreas: Normal.  No ductal dilatation or surrounding  inflammation. Spleen: Small soft tissue density in the left upper quadrant, likely diminutive spleen. Adrenal glands: No nodule. Kidneys: Multiple bilateral low-density lesions in both kidneys consistent with cysts. Mild right hydroureteronephrosis, distal ureter is suboptimally defined. There is an nonobstructing stone in the lower right kidney. No ureteral calculi. No left hydronephrosis. Stomach/Bowel: Stomach markedly distended with fluid. Diffuse fluid-filled dilated small bowel, measuring up to 4.1 cm. Transition point suspected in the right lower quadrant, however no definite decompressed small bowel is seen. No pneumatosis. Small volume of stool throughout the colon without colonic wall thickening. The ileocecal valve not well-defined. The appendix is not definitively seen. Vascular/Lymphatic: No retroperitoneal adenopathy. Abdominal aorta is normal in caliber. Dense atherosclerosis of the abdominal aorta and its branches. Reproductive: Uterus appears in situ, suboptimally defined due to fluid-filled adjacent bowel loops. Limited assessment for adnexal mass. Bladder: Minimally distended, irregular/tubular in shape. No wall thickening. Displaced posteriorly due to dilated small bowel. Other: Mild mesenteric edema centrally.  No free air or free fluid. Musculoskeletal: There are no acute or suspicious osseous abnormalities. Advanced facet arthropathy in the lumbar spine. Transitional lumbosacral anatomy. IMPRESSION: 1. High-grade small bowel obstruction with transition point in the right lower quadrant. No definite decompressed small bowel loops are seen, suspect distal ileum a site of obstruction. No perforation, pneumatosis or free fluid. 2. Right hydroureteronephrosis, however no obstructing lesion or stone. There is a nonobstructing stone in the lower right kidney. Multiple bilateral renal cysts. 3. Multiple liver lesions, largest represents a simple cyst. Smaller lesions are incompletely characterized  without contrast. Electronically Signed   By: Jeb Levering M.D.   On: 01/21/2015 03:26   Dg Abd Portable 1v-small Bowel Protocol-position Verification  01/21/2015  CLINICAL DATA:  Check nasogastric catheter placement EXAM: PORTABLE ABDOMEN - 1 VIEW COMPARISON:  01/20/2015 FINDINGS: A nasogastric catheter is noted coiled within the stomach. The tip is directed towards the pyloric channel. The stomach has been decompressed when compared with prior exam. The small bowel remains dilated similar to that seen on recent CT examination. Degenerative changes of lumbar spine are seen. IMPRESSION: Stable small bowel dilatation. Nasogastric catheter within the stomach. Electronically Signed   By: Inez Catalina M.D.   On: 01/21/2015 08:38      Assessment/Plan High grade SBO -Improved since NG tube placement -SB protocol to see if contrast goes through -No h/o abdominal surgery or infection, concern if she doesn't continue to improve she may require surgical interventions -IS, and Ambulate to aid in recovery of bowel function -NPO, NG tube, IVF, pain control, antiemetics -No need for antibiotics at this time. Leukocytosis - 13.6  Acute renal failure Right-sided hydronephrosis Multiple liver lesions HTN  HLD Dementia DVT Proph - SCDs, okay to be on SQ heparin   Nat Christen, [facility] Surgery 01/21/2015, 9:52 AM Pager: [phone]

## 2015-01-21 NOTE — Progress Notes (Signed)
   [facility]                                                                              Patient Demographics  Beth Wilkins, is a 74 y.o. female, DOB - 14-Sep-1940, [id number]  Admit date - 01/20/2015   Admitting Physician Rise Patience, MD  Outpatient Primary MD for the patient is Horton Finer, MD  LOS - 0   Chief Complaint  Patient presents with  . Dehydration      HPI on 01/21/2015 by Dr. Gean Birchwood Beth Wilkins is a 74 y.o. female with history of hypertension, hyperlipidemia, dementia was brought to the ER after patient has been having multiple episodes of nausea vomiting. As per patient's daughter who provided the history patient has been having decreased appetite and abdominal distention for almost 2-3 weeks now. Over the last 2-3 days patient has been having projectile vomiting which has not improved and patient was brought to the ER. CT abdomen and pelvis shows high-grade small bowel obstruction and on-call surgeon Dr. Excell Seltzer was consulted by the ER physician and patient has been admitted for further management. Patient has been placed on NG tube suction following which large amounts of fluid has been suctioned. As per the daughter patient has not complained of any abdominal pain as such. Patient does not recall her last bowel movement. Denies any chest pain or shortness of breath. Has not had any previous abdominal surgery as per the patient's daughter.   Assessment & Plan   Patient admitted earlier this morning by Dr. Hal Hope, please see full H&P for details.  Agree with current assessment and plan.  High grade Small bowel obstruction -CT abd/pelvis: High-grade small bowel obstruction with transition point in the right lower quadrant -Continue NG tube, NPO, IVF, antiemetics, pain control -General surgery consulted and appreciated -Pending abdominal x-ray with small bowel obstruction protocol  Acute renal failure -Upon admission,  was 2.37  -Likely secondary to dehydration and poor oral intake -Creatinine improving, currently 1.91  Right-sided hydronephrosis -No obstructing lesions noted on CT: Right hydroureteronephrosis, no obstructing lesion or stone -Will continue to monitor creatinine closely  Multiple liver lesions -Noted on CT of the abdomen, patient will need outpatient follow-up and monitoring  Hypertension -Home medications held, continue IV hydralazine PRN  Hyperlipidemia -Home medications held due to NPO  Dementia -Home medications currently held due to NPO  Leukocytosis -Likely secondary to SBO, will continue to monitor CBC -patient is currently afebrile, will not start on antibiotics at this time  Hypokalemia -Likely secondary to nausea and vomiting, will replete and continue to monitor BMP  Code Status: Full  Family Communication: Daughter at bedside  Disposition Plan: Admitted  Time Spent in minutes   30 minutes  Procedures  None  Consults   Gen. surgery  DVT Prophylaxis  Heparin  Jahron Hunsinger D.O. on 01/21/2015 at 10:29 AM  Between 7am to 7pm - Pager - [phone]  After 7pm go to [email or web address] - password [email or web address]  And look for the night coverage person covering for me after hours  [facility] Office  [phone]

## 2015-01-22 ENCOUNTER — Inpatient Hospital Stay (HOSPITAL_COMMUNITY): Payer: Medicare Other

## 2015-01-22 DIAGNOSIS — F0390 Unspecified dementia without behavioral disturbance: Secondary | ICD-10-CM

## 2015-01-22 LAB — CBC
HCT: 35.2 % — ABNORMAL LOW (ref 36.0–46.0)
Hemoglobin: 11.9 g/dL — ABNORMAL LOW (ref 12.0–15.0)
MCH: 29.1 pg (ref 26.0–34.0)
MCHC: 33.8 g/dL (ref 30.0–36.0)
MCV: 86.1 fL (ref 78.0–100.0)
Platelets: 291 10*3/uL (ref 150–400)
RBC: 4.09 MIL/uL (ref 3.87–5.11)
RDW: 14.9 % (ref 11.5–15.5)
WBC: 11.8 10*3/uL — ABNORMAL HIGH (ref 4.0–10.5)

## 2015-01-22 LAB — BASIC METABOLIC PANEL
Anion gap: 14 (ref 5–15)
BUN: 52 mg/dL — ABNORMAL HIGH (ref 6–20)
CALCIUM: 8.4 mg/dL — AB (ref 8.9–10.3)
CO2: 25 mmol/L (ref 22–32)
Chloride: 97 mmol/L — ABNORMAL LOW (ref 101–111)
Creatinine, Ser: 1.56 mg/dL — ABNORMAL HIGH (ref 0.44–1.00)
GFR calc Af Amer: 37 mL/min — ABNORMAL LOW (ref >60)
GFR, EST NON AFRICAN AMERICAN: 32 mL/min — AB (ref >60)
GLUCOSE: 85 mg/dL (ref 65–99)
Potassium: 3.2 mmol/L — ABNORMAL LOW (ref 3.5–5.1)
SODIUM: 136 mmol/L (ref 135–145)

## 2015-01-22 LAB — GLUCOSE, CAPILLARY
GLUCOSE-CAPILLARY: 87 mg/dL (ref 65–99)
GLUCOSE-CAPILLARY: 176 mg/dL — AB (ref 65–99)
GLUCOSE-CAPILLARY: 111 mg/dL — AB (ref 65–99)
Glucose-Capillary: 72 mg/dL (ref 65–99)
Glucose-Capillary: 95 mg/dL (ref 65–99)

## 2015-01-22 LAB — MAGNESIUM: Magnesium: 2.2 mg/dL (ref 1.7–2.4)

## 2015-01-22 MED ORDER — BISACODYL 10 MG RE SUPP
10.0000 mg | Freq: Once | RECTAL | Status: AC
  Administered 2015-01-22: 10 mg via RECTAL
  Filled 2015-01-22: qty 1

## 2015-01-22 MED ORDER — DEXTROSE-NACL 5-0.45 % IV SOLN
INTRAVENOUS | Status: DC
  Filled 2015-01-22: qty 1000

## 2015-01-22 MED ORDER — SODIUM CHLORIDE 0.9 % IV SOLN
INTRAVENOUS | Status: DC
  Administered 2015-01-22: 17:00:00 via INTRAVENOUS
  Administered 2015-01-22: 1000 mL via INTRAVENOUS
  Administered 2015-01-23: 22:00:00 via INTRAVENOUS

## 2015-01-22 MED ORDER — POTASSIUM CHLORIDE 10 MEQ/100ML IV SOLN
10.0000 meq | INTRAVENOUS | Status: AC
  Administered 2015-01-22 (×4): 10 meq via INTRAVENOUS
  Filled 2015-01-22 (×4): qty 100

## 2015-01-22 MED ORDER — DEXTROSE-NACL 5-0.45 % IV SOLN
INTRAVENOUS | Status: DC
  Administered 2015-01-22: 10:00:00 via INTRAVENOUS

## 2015-01-22 MED ORDER — POTASSIUM CITRATE-CITRIC ACID 1100-334 MG/5ML PO SOLN: 10.0000 meq | Freq: Three times a day (TID) | ORAL | Status: DC

## 2015-01-22 NOTE — Plan of Care (Signed)
Problem: Bowel/Gastric: Goal: Will not experience complications related to bowel motility Outcome: Progressing NGT in place to low wall suction.

## 2015-01-22 NOTE — Progress Notes (Signed)
K+ 3.2 this am. Paged NP Schorr via [email or web address] to make aware.

## 2015-01-22 NOTE — Progress Notes (Addendum)
[facility]                                                                              Patient Demographics  Beth Wilkins, is a 74 y.o. female, DOB - 03/30/1940, [id number]  Admit date - 01/20/2015   Admitting Physician Rise Patience, MD  Outpatient Primary MD for the patient is [name], MD  LOS - 1   Chief Complaint  Patient presents with  . Dehydration      HPI on 01/21/2015 by Dr. Gean Birchwood Beth Wilkins is a 74 y.o. female with history of hypertension, hyperlipidemia, dementia was brought to the ER after patient has been having multiple episodes of nausea vomiting. As per patient's daughter who provided the history patient has been having decreased appetite and abdominal distention for almost 2-3 weeks now. Over the last 2-3 days patient has been having projectile vomiting which has not improved and patient was brought to the ER. CT abdomen and pelvis shows high-grade small bowel obstruction and on-call surgeon Dr. Excell Seltzer was consulted by the ER physician and patient has been admitted for further management. Patient has been placed on NG tube suction following which large amounts of fluid has been suctioned. As per the daughter patient has not complained of any abdominal pain as such. Patient does not recall her last bowel movement. Denies any chest pain or shortness of breath. Has not had any previous abdominal surgery as per the patient's daughter.   Assessment & Plan  High grade Small bowel obstruction -CT abd/pelvis: High-grade small bowel obstruction with transition point in the right lower quadrant -Continue NG tube, NPO, IVF, antiemetics, pain control -General surgery consulted and appreciated -Small bowel protocol did not show improvement in obstruction -KUB this morning: Persistent obstruction -Discussed with surgery, we'll continue to monitor for an additional 24 hours -Surgery ordered Dulcolax  Acute renal  failure -Upon admission, was 2.37  -Likely secondary to dehydration and poor oral intake -Creatinine improving, currently 1.56  Right-sided hydronephrosis -No obstructing lesions noted on CT: Right hydroureteronephrosis, no obstructing lesion or stone -Will continue to monitor creatinine closely  Multiple liver lesions -Noted on CT of the abdomen, patient will need outpatient follow-up and monitoring  Hypertension -Home medications held, continue IV hydralazine PRN  Hyperlipidemia -Home medications held due to NPO  Dementia -Home medications currently held due to NPO  Leukocytosis -Improving, Likely secondary to SBO -continue to monitor CBC -patient is currently afebrile, will not start on antibiotics at this time  Hypokalemia -Likely secondary to nausea and vomiting, will replete and continue to monitor BMP -Magnesium 2.2  Code Status: Full  Family Communication: Daughter at bedside  Disposition Plan: Admitted. Continue conservative management for SBO  Time Spent in minutes 30 minutes  Procedures  None  Consults  Gen. surgery  DVT Prophylaxis Heparin  Lab Results  Component Value Date   PLT 291 01/22/2015    Medications  Scheduled Meds: . bisacodyl  10 mg Rectal Once  . heparin subcutaneous  5,000 Units Subcutaneous 3 times per day  . potassium chloride  10 mEq Intravenous Q1 Hr x 4   Continuous Infusions: . dextrose 5 % and 0.45% NaCl 75  mL/hr at 01/22/15 1022   PRN Meds:.acetaminophen **OR** acetaminophen, hydrALAZINE, ondansetron **OR** ondansetron (ZOFRAN) IV, phenol  Antibiotics    Anti-infectives    None      Subjective:   [name] seen and examined today.  Patient states she is feeling better today. Denies abdominal pain until her abdomen is touched.  Would like to drink water. Complains about dry mouth.  Feels her abdomen has decreased in size.  Denies BM or flatus, CP, SOB.   Objective:   Filed Vitals:   01/21/15 1326  01/21/15 2035 01/21/15 2045 01/22/15 0605  BP: 129/68  111/56 119/57  Pulse: 99 94 106 96  Temp: 97.4 F (36.3 C)  98.1 F (36.7 C) 98.2 F (36.8 C)  TempSrc: Oral  Oral Oral  Resp: 18  20 20   Height:      Weight:    46.72 kg (103 lb)  SpO2: 98%  100% 100%    Wt Readings from Last 3 Encounters:  01/22/15 46.72 kg (103 lb)  09/02/14 54.432 kg (120 lb)  03/03/14 56.7 kg (125 lb)     Intake/Output Summary (Last 24 hours) at 01/22/15 1108 Last data filed at 01/22/15 0800  Gross per 24 hour  Intake   2146 ml  Output   1750 ml  Net    396 ml    Exam  General: Well developed, well nourished, NAD, appears stated age  74: NCAT, mucous membranes moist. NGT in place  Cardiovascular: S1 S2 auscultated, no rubs, murmurs or gallops. Regular rate and rhythm.  Respiratory: Clear to auscultation bilaterally with equal chest rise  Abdomen: Soft, nontender, mildly distended, + bowel sounds  Extremities: warm dry without cyanosis clubbing or edema  Neuro: AAOx2 (person and place, not time.  Nonfocal  Psych: Normal affect and demeanor   Data Review   Micro Results No results found for this or any previous visit (from the past 240 hour(s)).  Radiology Reports Ct Abdomen Pelvis Wo Contrast  01/21/2015  CLINICAL DATA:  Abdominal distention for 4 days with nausea and vomiting. Decreased urine output. EXAM: CT ABDOMEN AND PELVIS WITHOUT CONTRAST TECHNIQUE: Multidetector CT imaging of the abdomen and pelvis was performed following the standard protocol without IV contrast. COMPARISON:  None. FINDINGS: Lower chest:  The included lung bases are clear. Liver: Ladder density lesion in the anterior subcapsular left lobe measures 3.6 cm and is consistent with simple cyst. Additional smaller hypodense lesions in the left and anterior right lobe are incompletely characterized without contrast. Hepatobiliary: Gallbladder physiologically distended, displaced laterally by dilated stomach. No  calcified gallstone. Pancreas: Normal.  No ductal dilatation or surrounding inflammation. Spleen: Small soft tissue density in the left upper quadrant, likely diminutive spleen. Adrenal glands: No nodule. Kidneys: Multiple bilateral low-density lesions in both kidneys consistent with cysts. Mild right hydroureteronephrosis, distal ureter is suboptimally defined. There is an nonobstructing stone in the lower right kidney. No ureteral calculi. No left hydronephrosis. Stomach/Bowel: Stomach markedly distended with fluid. Diffuse fluid-filled dilated small bowel, measuring up to 4.1 cm. Transition point suspected in the right lower quadrant, however no definite decompressed small bowel is seen. No pneumatosis. Small volume of stool throughout the colon without colonic wall thickening. The ileocecal valve not well-defined. The appendix is not definitively seen. Vascular/Lymphatic: No retroperitoneal adenopathy. Abdominal aorta is normal in caliber. Dense atherosclerosis of the abdominal aorta and its branches. Reproductive: Uterus appears in situ, suboptimally defined due to fluid-filled adjacent bowel loops. Limited assessment for adnexal mass. Bladder: Minimally  distended, irregular/tubular in shape. No wall thickening. Displaced posteriorly due to dilated small bowel. Other: Mild mesenteric edema centrally.  No free air or free fluid. Musculoskeletal: There are no acute or suspicious osseous abnormalities. Advanced facet arthropathy in the lumbar spine. Transitional lumbosacral anatomy. IMPRESSION: 1. High-grade small bowel obstruction with transition point in the right lower quadrant. No definite decompressed small bowel loops are seen, suspect distal ileum a site of obstruction. No perforation, pneumatosis or free fluid. 2. Right hydroureteronephrosis, however no obstructing lesion or stone. There is a nonobstructing stone in the lower right kidney. Multiple bilateral renal cysts. 3. Multiple liver lesions, largest  represents a simple cyst. Smaller lesions are incompletely characterized without contrast. Electronically Signed   By: Jeb Levering M.D.   On: 01/21/2015 03:26   Dg Abd Portable 1v  01/22/2015  CLINICAL DATA:  Nausea, vomiting, abdominal distension, small bowel obstruction, hypertension, dementia, GERD EXAM: PORTABLE ABDOMEN - 1 VIEW COMPARISON:  01/21/2015 FINDINGS: Contrast opacifies multiple dilated small bowel loops. Persistent small bowel dilatation unchanged previous exam. Nasogastric tube coiled in stomach with tip projecting over pylorus or proximal duodenum. No definite bowel wall thickening. No significant colonic gas or stool. Bones demineralized. IMPRESSION: Persistent small bowel dilatation compatible with obstruction. Electronically Signed   By: Lavonia Dana M.D.   On: 01/22/2015 07:51   Dg Abd Portable 1v-small Bowel Obstruction Protocol-initial, 8 Hr Delay  01/21/2015  CLINICAL DATA:  8 hour delay film EXAM: PORTABLE ABDOMEN - 1 VIEW COMPARISON:  Earlier film of the same day FINDINGS: Nasogastric tube tip probably just beyond the pylorus. Multiple dilated small bowel loops as before. Some enteral contrast is now evident, predominantly in dilated small bowel loops. The colon appears decompressed. Degenerative changes in the lower lumbar spine. IMPRESSION: 1. Stable nasogastric tube into the proximal duodenum, with little change in appearance of small bowel obstruction. Electronically Signed   By: Lucrezia Europe M.D.   On: 01/21/2015 17:39   Dg Abd Portable 1v-small Bowel Protocol-position Verification  01/21/2015  CLINICAL DATA:  Check nasogastric catheter placement EXAM: PORTABLE ABDOMEN - 1 VIEW COMPARISON:  01/20/2015 FINDINGS: A nasogastric catheter is noted coiled within the stomach. The tip is directed towards the pyloric channel. The stomach has been decompressed when compared with prior exam. The small bowel remains dilated similar to that seen on recent CT examination. Degenerative  changes of lumbar spine are seen. IMPRESSION: Stable small bowel dilatation. Nasogastric catheter within the stomach. Electronically Signed   By: Inez Catalina M.D.   On: 01/21/2015 08:38    CBC  Recent Labs Lab 01/20/15 2247 01/21/15 0745 01/22/15 0350  WBC 12.9* 13.6* 11.8*  HGB 13.5 12.0 11.9*  HCT 38.8 34.7* 35.2*  PLT 319 298 291  MCV 83.8 83.4 86.1  MCH 29.2 28.8 29.1  MCHC 34.8 34.6 33.8  RDW 14.5 14.5 14.9  LYMPHSABS 2.3 1.5  --   MONOABS 0.8 0.6  --   EOSABS 0.0 0.0  --   BASOSABS 0.0 0.0  --     Chemistries   Recent Labs Lab 01/20/15 2247 01/21/15 0745 01/22/15 0350  NA 129* 130* 136  K 3.7 3.1* 3.2*  CL 82* 88* 97*  CO2 30 25 25   GLUCOSE 122* 97 85  BUN 74* 67* 52*  CREATININE 2.37* 1.91* 1.56*  CALCIUM 9.2 8.4* 8.4*  MG  --   --  2.2  AST 27 25  --   ALT 23 22  --   ALKPHOS 74 66  --  BILITOT 1.3* 1.4*  --    ------------------------------------------------------------------------------------------------------------------ estimated creatinine clearance is 24 mL/min (by C-G formula based on Cr of 1.56). ------------------------------------------------------------------------------------------------------------------ No results for input(s): HGBA1C in the last 72 hours. ------------------------------------------------------------------------------------------------------------------ No results for input(s): CHOL, HDL, LDLCALC, TRIG, CHOLHDL, LDLDIRECT in the last 72 hours. ------------------------------------------------------------------------------------------------------------------ No results for input(s): TSH, T4TOTAL, T3FREE, THYROIDAB in the last 72 hours.  Invalid input(s): FREET3 ------------------------------------------------------------------------------------------------------------------ No results for input(s): VITAMINB12, FOLATE, FERRITIN, TIBC, IRON, RETICCTPCT in the last 72 hours.  Coagulation profile No results for input(s):  INR, PROTIME in the last 168 hours.  No results for input(s): DDIMER in the last 72 hours.  Cardiac Enzymes No results for input(s): CKMB, TROPONINI, MYOGLOBIN in the last 168 hours.  Invalid input(s): CK ------------------------------------------------------------------------------------------------------------------ Invalid input(s): POCBNP    Jalexis Breed D.O. on 01/22/2015 at 11:08 AM  Between 7am to 7pm - Pager - [phone]  After 7pm go to [email or web address] - password [email or web address]  And look for the night coverage person covering for me after hours  [facility] Office  [phone]

## 2015-01-22 NOTE — Progress Notes (Signed)
[facility] Progress Note     Subjective: Doesn't feel that bad.  Still some bloating, not as bad as before.  No N/V, NG in place with 1527mL output.  Tolerating ice chips.  Not ambulated much.  Daughter at bedside.  No flatus or BM yet.  Willing to try dulcolax.  Objective: Vital signs in last 24 hours: Temp:  [97.4 F (36.3 C)-98.2 F (36.8 C)] 98.2 F (36.8 C) (11/17 0605) Pulse Rate:  [94-106] 96 (11/17 0605) Resp:  [18-20] 20 (11/17 0605) BP: (111-129)/(56-68) 119/57 mmHg (11/17 0605) SpO2:  [98 %-100 %] 100 % (11/17 0605) Weight:  [46.72 kg (103 lb)] 46.72 kg (103 lb) (11/17 0605) Last BM Date:  (PTA)  Intake/Output from previous day: 11/16 0701 - 11/17 0700 In: 2146 [P.O.:30; I.V.:1616; IV Piggyback:500] Out: 1500 [Emesis/NG output:1500] Intake/Output this shift:    PE: Gen:  Alert, NAD, pleasant Card:  RRR, no M/G/R heard Pulm:  CTA, no W/R/R Abd: Soft, mild distension, NT, +BS (more in left lower than rest of abdomen), no HSM, no surgical scars noted   Lab Results:   Recent Labs  01/21/15 0745 01/22/15 0350  WBC 13.6* 11.8*  HGB 12.0 11.9*  HCT 34.7* 35.2*  PLT 298 291   BMET  Recent Labs  01/21/15 0745 01/22/15 0350  NA 130* 136  K 3.1* 3.2*  CL 88* 97*  CO2 25 25  GLUCOSE 97 85  BUN 67* 52*  CREATININE 1.91* 1.56*  CALCIUM 8.4* 8.4*   PT/INR No results for input(s): LABPROT, INR in the last 72 hours. CMP     Component Value Date/Time   NA 136 01/22/2015 0350   K 3.2* 01/22/2015 0350   CL 97* 01/22/2015 0350   CO2 25 01/22/2015 0350   GLUCOSE 85 01/22/2015 0350   BUN 52* 01/22/2015 0350   CREATININE 1.56* 01/22/2015 0350   CALCIUM 8.4* 01/22/2015 0350   PROT 6.9 01/21/2015 0745   ALBUMIN 3.9 01/21/2015 0745   AST 25 01/21/2015 0745   ALT 22 01/21/2015 0745   ALKPHOS 66 01/21/2015 0745   BILITOT 1.4* 01/21/2015 0745   GFRNONAA 32* 01/22/2015 0350   GFRAA 37* 01/22/2015 0350   Lipase     Component Value Date/Time    LIPASE 55* 01/20/2015 2247       Studies/Results: Ct Abdomen Pelvis Wo Contrast  01/21/2015  CLINICAL DATA:  Abdominal distention for 4 days with nausea and vomiting. Decreased urine output. EXAM: CT ABDOMEN AND PELVIS WITHOUT CONTRAST TECHNIQUE: Multidetector CT imaging of the abdomen and pelvis was performed following the standard protocol without IV contrast. COMPARISON:  None. FINDINGS: Lower chest:  The included lung bases are clear. Liver: Ladder density lesion in the anterior subcapsular left lobe measures 3.6 cm and is consistent with simple cyst. Additional smaller hypodense lesions in the left and anterior right lobe are incompletely characterized without contrast. Hepatobiliary: Gallbladder physiologically distended, displaced laterally by dilated stomach. No calcified gallstone. Pancreas: Normal.  No ductal dilatation or surrounding inflammation. Spleen: Small soft tissue density in the left upper quadrant, likely diminutive spleen. Adrenal glands: No nodule. Kidneys: Multiple bilateral low-density lesions in both kidneys consistent with cysts. Mild right hydroureteronephrosis, distal ureter is suboptimally defined. There is an nonobstructing stone in the lower right kidney. No ureteral calculi. No left hydronephrosis. Stomach/Bowel: Stomach markedly distended with fluid. Diffuse fluid-filled dilated small bowel, measuring up to 4.1 cm. Transition point suspected in the right lower quadrant, however no definite decompressed small bowel is  seen. No pneumatosis. Small volume of stool throughout the colon without colonic wall thickening. The ileocecal valve not well-defined. The appendix is not definitively seen. Vascular/Lymphatic: No retroperitoneal adenopathy. Abdominal aorta is normal in caliber. Dense atherosclerosis of the abdominal aorta and its branches. Reproductive: Uterus appears in situ, suboptimally defined due to fluid-filled adjacent bowel loops. Limited assessment for adnexal  mass. Bladder: Minimally distended, irregular/tubular in shape. No wall thickening. Displaced posteriorly due to dilated small bowel. Other: Mild mesenteric edema centrally.  No free air or free fluid. Musculoskeletal: There are no acute or suspicious osseous abnormalities. Advanced facet arthropathy in the lumbar spine. Transitional lumbosacral anatomy. IMPRESSION: 1. High-grade small bowel obstruction with transition point in the right lower quadrant. No definite decompressed small bowel loops are seen, suspect distal ileum a site of obstruction. No perforation, pneumatosis or free fluid. 2. Right hydroureteronephrosis, however no obstructing lesion or stone. There is a nonobstructing stone in the lower right kidney. Multiple bilateral renal cysts. 3. Multiple liver lesions, largest represents a simple cyst. Smaller lesions are incompletely characterized without contrast. Electronically Signed   By: Jeb Levering M.D.   On: 01/21/2015 03:26   Dg Abd Portable 1v-small Bowel Obstruction Protocol-initial, 8 Hr Delay  01/21/2015  CLINICAL DATA:  8 hour delay film EXAM: PORTABLE ABDOMEN - 1 VIEW COMPARISON:  Earlier film of the same day FINDINGS: Nasogastric tube tip probably just beyond the pylorus. Multiple dilated small bowel loops as before. Some enteral contrast is now evident, predominantly in dilated small bowel loops. The colon appears decompressed. Degenerative changes in the lower lumbar spine. IMPRESSION: 1. Stable nasogastric tube into the proximal duodenum, with little change in appearance of small bowel obstruction. Electronically Signed   By: Lucrezia Europe M.D.   On: 01/21/2015 17:39   Dg Abd Portable 1v-small Bowel Protocol-position Verification  01/21/2015  CLINICAL DATA:  Check nasogastric catheter placement EXAM: PORTABLE ABDOMEN - 1 VIEW COMPARISON:  01/20/2015 FINDINGS: A nasogastric catheter is noted coiled within the stomach. The tip is directed towards the pyloric channel. The stomach  has been decompressed when compared with prior exam. The small bowel remains dilated similar to that seen on recent CT examination. Degenerative changes of lumbar spine are seen. IMPRESSION: Stable small bowel dilatation. Nasogastric catheter within the stomach. Electronically Signed   By: Inez Catalina M.D.   On: 01/21/2015 08:38    Anti-infectives: Anti-infectives    None       Assessment/Plan High grade SBO, HD #2/3 -Clinically improved since NG tube placement, but xrays not showing much improvement -SB protocol 8 hr/24 hr delay were not improved, and no contrast in colon, repeat this am.  May need to make decision about surgery in next 24-48hr. -No h/o abdominal surgery or infection, concern if she doesn't continue to improve she may require surgical interventions.  May need surgery as early as tomorrow if not improving -IS, and Ambulate to aid in recovery of bowel function -NPO, NG tube (clamp to walk), IVF, pain control, antiemetics -No need for antibiotics at this time - WBC trending down. -Try dulcolax Leukocytosis - down to 11.8  Acute renal failure Right-sided hydronephrosis Multiple liver lesions HTN HLD Dementia DVT Proph - SCDs, SQ heparin    LOS: 1 day    Nat Christen 01/22/2015, 7:06 AM Pager: [phone]

## 2015-01-23 ENCOUNTER — Inpatient Hospital Stay (HOSPITAL_COMMUNITY): Payer: Medicare Other | Admitting: Certified Registered"

## 2015-01-23 ENCOUNTER — Encounter (HOSPITAL_COMMUNITY)
Admission: EM | Disposition: A | Discharge status: Home / Self Care | Payer: Self-pay | Source: Home / Self Care | Attending: Internal Medicine

## 2015-01-23 ENCOUNTER — Inpatient Hospital Stay (HOSPITAL_COMMUNITY): Payer: Medicare Other

## 2015-01-23 ENCOUNTER — Encounter (HOSPITAL_COMMUNITY): Payer: Self-pay | Admitting: Certified Registered"

## 2015-01-23 HISTORY — PX: HEMICOLECTOMY: SHX854

## 2015-01-23 HISTORY — PX: LAPAROSCOPY: SHX197

## 2015-01-23 LAB — BASIC METABOLIC PANEL
ANION GAP: 14 (ref 5–15)
BUN: 38 mg/dL — ABNORMAL HIGH (ref 6–20)
CHLORIDE: 98 mmol/L — AB (ref 101–111)
CO2: 26 mmol/L (ref 22–32)
Calcium: 8.6 mg/dL — ABNORMAL LOW (ref 8.9–10.3)
Creatinine, Ser: 1.28 mg/dL — ABNORMAL HIGH (ref 0.44–1.00)
GFR calc non Af Amer: 41 mL/min — ABNORMAL LOW (ref >60)
GFR, EST AFRICAN AMERICAN: 47 mL/min — AB (ref >60)
Glucose, Bld: 79 mg/dL (ref 65–99)
POTASSIUM: 3.3 mmol/L — AB (ref 3.5–5.1)
SODIUM: 138 mmol/L (ref 135–145)

## 2015-01-23 LAB — CBC
HCT: 33.5 % — ABNORMAL LOW (ref 36.0–46.0)
HEMOGLOBIN: 11.5 g/dL — AB (ref 12.0–15.0)
MCH: 29.9 pg (ref 26.0–34.0)
MCHC: 34.3 g/dL (ref 30.0–36.0)
MCV: 87 fL (ref 78.0–100.0)
Platelets: 267 10*3/uL (ref 150–400)
RBC: 3.85 MIL/uL — AB (ref 3.87–5.11)
RDW: 15.1 % (ref 11.5–15.5)
WBC: 10.9 10*3/uL — AB (ref 4.0–10.5)

## 2015-01-23 LAB — GLUCOSE, CAPILLARY
GLUCOSE-CAPILLARY: 77 mg/dL (ref 65–99)
GLUCOSE-CAPILLARY: 79 mg/dL (ref 65–99)
GLUCOSE-CAPILLARY: 95 mg/dL (ref 65–99)
Glucose-Capillary: 79 mg/dL (ref 65–99)

## 2015-01-23 SURGERY — LAPAROSCOPY, DIAGNOSTIC
Anesthesia: General

## 2015-01-23 MED ORDER — PROPOFOL 10 MG/ML IV BOLUS
INTRAVENOUS | Status: DC | PRN
  Administered 2015-01-23: 100 mg via INTRAVENOUS

## 2015-01-23 MED ORDER — LIDOCAINE HCL (CARDIAC) 20 MG/ML IV SOLN
INTRAVENOUS | Status: AC
  Filled 2015-01-23: qty 5

## 2015-01-23 MED ORDER — LACTATED RINGERS IV SOLN
INTRAVENOUS | Status: DC
  Administered 2015-01-23 (×2): 1000 mL via INTRAVENOUS
  Administered 2015-01-23: 18:00:00 via INTRAVENOUS

## 2015-01-23 MED ORDER — POTASSIUM CHLORIDE 10 MEQ/100ML IV SOLN
10.0000 meq | INTRAVENOUS | Status: AC
  Administered 2015-01-23 (×4): 10 meq via INTRAVENOUS
  Filled 2015-01-23 (×4): qty 100

## 2015-01-23 MED ORDER — PROPOFOL 10 MG/ML IV BOLUS
INTRAVENOUS | Status: AC
  Filled 2015-01-23: qty 20

## 2015-01-23 MED ORDER — LIDOCAINE HCL (PF) 2 % IJ SOLN
INTRAMUSCULAR | Status: DC | PRN
  Administered 2015-01-23: 30 mg via INTRADERMAL

## 2015-01-23 MED ORDER — CEFAZOLIN SODIUM-DEXTROSE 2-3 GM-% IV SOLR
INTRAVENOUS | Status: AC
  Filled 2015-01-23: qty 50

## 2015-01-23 MED ORDER — FENTANYL CITRATE (PF) 250 MCG/5ML IJ SOLN
INTRAMUSCULAR | Status: AC
  Filled 2015-01-23: qty 5

## 2015-01-23 MED ORDER — MIDAZOLAM HCL 2 MG/2ML IJ SOLN
INTRAMUSCULAR | Status: AC
  Filled 2015-01-23: qty 2

## 2015-01-23 MED ORDER — KCL IN DEXTROSE-NACL 20-5-0.45 MEQ/L-%-% IV SOLN
INTRAVENOUS | Status: DC
  Administered 2015-01-24: 22:00:00 via INTRAVENOUS
  Administered 2015-01-24: 1000 mL via INTRAVENOUS
  Administered 2015-01-24 – 2015-01-26 (×5): via INTRAVENOUS
  Administered 2015-01-27: 1000 mL via INTRAVENOUS
  Administered 2015-01-29 – 2015-01-30 (×2): via INTRAVENOUS
  Filled 2015-01-23 (×13): qty 1000

## 2015-01-23 MED ORDER — ONDANSETRON HCL 4 MG/2ML IJ SOLN
INTRAMUSCULAR | Status: AC
Start: 2015-01-23 — End: 2015-01-23
  Filled 2015-01-23: qty 2

## 2015-01-23 MED ORDER — LABETALOL HCL 5 MG/ML IV SOLN
INTRAVENOUS | Status: DC | PRN
  Administered 2015-01-23: 5 mg via INTRAVENOUS

## 2015-01-23 MED ORDER — SUCCINYLCHOLINE CHLORIDE 20 MG/ML IJ SOLN
INTRAMUSCULAR | Status: DC | PRN
  Administered 2015-01-23: 80 mg via INTRAVENOUS

## 2015-01-23 MED ORDER — LACTATED RINGERS IV SOLN: INTRAVENOUS | Status: DC

## 2015-01-23 MED ORDER — ROCURONIUM BROMIDE 100 MG/10ML IV SOLN
INTRAVENOUS | Status: AC
Start: 2015-01-23 — End: 2015-01-23
  Filled 2015-01-23: qty 1

## 2015-01-23 MED ORDER — ONDANSETRON HCL 4 MG/2ML IJ SOLN
INTRAMUSCULAR | Status: DC | PRN
  Administered 2015-01-23: 4 mg via INTRAVENOUS

## 2015-01-23 MED ORDER — CEFAZOLIN SODIUM-DEXTROSE 2-3 GM-% IV SOLR
2.0000 g | Freq: Once | INTRAVENOUS | Status: AC
  Administered 2015-01-23: 2 g via INTRAVENOUS

## 2015-01-23 MED ORDER — HYDROMORPHONE HCL 1 MG/ML IJ SOLN
INTRAMUSCULAR | Status: AC
  Administered 2015-01-23: 0.25 mg via INTRAVENOUS
  Filled 2015-01-23: qty 1

## 2015-01-23 MED ORDER — LABETALOL HCL 5 MG/ML IV SOLN
INTRAVENOUS | Status: AC
  Filled 2015-01-23: qty 4

## 2015-01-23 MED ORDER — DEXTROSE 5 % IV SOLN
2.0000 g | Freq: Two times a day (BID) | INTRAVENOUS | Status: AC
  Administered 2015-01-24: 2 g via INTRAVENOUS
  Filled 2015-01-23: qty 2

## 2015-01-23 MED ORDER — PHENYLEPHRINE HCL 10 MG/ML IJ SOLN
INTRAMUSCULAR | Status: DC | PRN
  Administered 2015-01-23 (×4): 80 ug via INTRAVENOUS

## 2015-01-23 MED ORDER — FENTANYL CITRATE (PF) 100 MCG/2ML IJ SOLN: 12.5000 ug | INTRAMUSCULAR | Status: DC | PRN

## 2015-01-23 MED ORDER — HYDROMORPHONE HCL 1 MG/ML IJ SOLN
0.2500 mg | INTRAMUSCULAR | Status: DC | PRN
  Administered 2015-01-23 (×2): 0.25 mg via INTRAVENOUS

## 2015-01-23 MED ORDER — ONDANSETRON HCL 4 MG/2ML IJ SOLN: 4.0000 mg | Freq: Four times a day (QID) | INTRAMUSCULAR | Status: DC | PRN

## 2015-01-23 MED ORDER — ONDANSETRON HCL 4 MG PO TABS: 4.0000 mg | ORAL_TABLET | Freq: Four times a day (QID) | ORAL | Status: DC | PRN

## 2015-01-23 MED ORDER — KCL IN DEXTROSE-NACL 20-5-0.45 MEQ/L-%-% IV SOLN
INTRAVENOUS | Status: AC
  Filled 2015-01-23: qty 1000

## 2015-01-23 MED ORDER — HEPARIN SODIUM (PORCINE) 5000 UNIT/ML IJ SOLN
5000.0000 [IU] | Freq: Three times a day (TID) | INTRAMUSCULAR | Status: DC
  Administered 2015-01-24 – 2015-01-30 (×18): 5000 [IU] via SUBCUTANEOUS
  Filled 2015-01-23 (×19): qty 1

## 2015-01-23 MED ORDER — BUPIVACAINE LIPOSOME 1.3 % IJ SUSP
20.0000 mL | Freq: Once | INTRAMUSCULAR | Status: DC
  Filled 2015-01-23: qty 20

## 2015-01-23 MED ORDER — MIDAZOLAM HCL 5 MG/5ML IJ SOLN
INTRAMUSCULAR | Status: DC | PRN
  Administered 2015-01-23: 2 mg via INTRAVENOUS

## 2015-01-23 MED ORDER — ROCURONIUM BROMIDE 100 MG/10ML IV SOLN
INTRAVENOUS | Status: DC | PRN
  Administered 2015-01-23 (×2): 10 mg via INTRAVENOUS
  Administered 2015-01-23: 30 mg via INTRAVENOUS
  Administered 2015-01-23: 10 mg via INTRAVENOUS

## 2015-01-23 MED ORDER — 0.9 % SODIUM CHLORIDE (POUR BTL) OPTIME
TOPICAL | Status: DC | PRN
  Administered 2015-01-23: 5000 mL

## 2015-01-23 MED ORDER — FENTANYL CITRATE (PF) 100 MCG/2ML IJ SOLN
INTRAMUSCULAR | Status: DC | PRN
  Administered 2015-01-23 (×7): 50 ug via INTRAVENOUS

## 2015-01-23 MED ORDER — PHENYLEPHRINE 40 MCG/ML (10ML) SYRINGE FOR IV PUSH (FOR BLOOD PRESSURE SUPPORT)
PREFILLED_SYRINGE | INTRAVENOUS | Status: AC
  Filled 2015-01-23: qty 10

## 2015-01-23 MED ORDER — FENTANYL CITRATE (PF) 100 MCG/2ML IJ SOLN
INTRAMUSCULAR | Status: AC
  Filled 2015-01-23: qty 2

## 2015-01-23 MED ORDER — METOPROLOL TARTRATE 1 MG/ML IV SOLN
5.0000 mg | Freq: Four times a day (QID) | INTRAVENOUS | Status: DC
  Administered 2015-01-24 – 2015-01-30 (×12): 5 mg via INTRAVENOUS
  Filled 2015-01-23 (×19): qty 5

## 2015-01-23 MED ORDER — SUGAMMADEX SODIUM 500 MG/5ML IV SOLN
INTRAVENOUS | Status: DC | PRN
  Administered 2015-01-23: 200 mg via INTRAVENOUS

## 2015-01-23 SURGICAL SUPPLY — 43 items
BENZOIN TINCTURE PRP APPL 2/3 (GAUZE/BANDAGES/DRESSINGS) IMPLANT
CELLS DAT CNTRL 66122 CELL SVR (MISCELLANEOUS) ×1 IMPLANT
CLOSURE WOUND 1/2 X4 (GAUZE/BANDAGES/DRESSINGS)
COVER SURGICAL LIGHT HANDLE (MISCELLANEOUS) ×6 IMPLANT
DECANTER SPIKE VIAL GLASS SM (MISCELLANEOUS) IMPLANT
DRAPE LAPAROSCOPIC ABDOMINAL (DRAPES) ×3 IMPLANT
ELECT REM PT RETURN 9FT ADLT (ELECTROSURGICAL) ×3
ELECTRODE REM PT RTRN 9FT ADLT (ELECTROSURGICAL) ×1 IMPLANT
GLOVE BIOGEL M 8.0 STRL (GLOVE) ×6 IMPLANT
GLOVE BIOGEL PI IND STRL 7.0 (GLOVE) ×4 IMPLANT
GLOVE BIOGEL PI INDICATOR 7.0 (GLOVE) ×8
GOWN SPEC L4 XLG W/TWL (GOWN DISPOSABLE) IMPLANT
GOWN STRL REUS W/TWL LRG LVL3 (GOWN DISPOSABLE) IMPLANT
GOWN STRL REUS W/TWL XL LVL3 (GOWN DISPOSABLE) ×18 IMPLANT
HANDLE STAPLE EGIA 4 XL (STAPLE) ×3 IMPLANT
KIT BASIN OR (CUSTOM PROCEDURE TRAY) IMPLANT
KIT PREVENA INCISION MGT20CM45 (CANNISTER) ×3 IMPLANT
LIGASURE IMPACT 36 18CM CVD LR (INSTRUMENTS) ×3 IMPLANT
PACK COLON (CUSTOM PROCEDURE TRAY) ×3 IMPLANT
RELOAD EGIA 60 MED/THCK PURPLE (STAPLE) ×12 IMPLANT
RTRCTR WOUND ALEXIS 18CM MED (MISCELLANEOUS) ×3
SET IRRIG TUBING LAPAROSCOPIC (IRRIGATION / IRRIGATOR) IMPLANT
SHEARS HARMONIC ACE PLUS 36CM (ENDOMECHANICALS) IMPLANT
SLEEVE XCEL OPT CAN 5 100 (ENDOMECHANICALS) ×6 IMPLANT
SLEEVE Z-THREAD 5X100MM (TROCAR) IMPLANT
SOLUTION ANTI FOG 6CC (MISCELLANEOUS) ×3 IMPLANT
SPONGE LAP 18X18 X RAY DECT (DISPOSABLE) ×3 IMPLANT
STRIP CLOSURE SKIN 1/2X4 (GAUZE/BANDAGES/DRESSINGS) IMPLANT
SUCTION POOLE TIP (SUCTIONS) ×9 IMPLANT
SUT NOVA NAB DX-16 0-1 5-0 T12 (SUTURE) ×6 IMPLANT
SUT PDS AB 4-0 SH 27 (SUTURE) ×6 IMPLANT
SUT SILK 3 0 SH CR/8 (SUTURE) ×6 IMPLANT
SUT VIC AB 2-0 SH 27 (SUTURE) ×2
SUT VIC AB 2-0 SH 27X BRD (SUTURE) ×1 IMPLANT
SUT VIC AB 4-0 SH 18 (SUTURE) ×3 IMPLANT
SYR 30ML LL (SYRINGE) IMPLANT
TRAY FOLEY W/METER SILVER 14FR (SET/KITS/TRAYS/PACK) ×3 IMPLANT
TRAY FOLEY W/METER SILVER 16FR (SET/KITS/TRAYS/PACK) IMPLANT
TRAY LAPAROSCOPIC (CUSTOM PROCEDURE TRAY) IMPLANT
TROCAR BLADELESS OPT 5 100 (ENDOMECHANICALS) ×3 IMPLANT
TROCAR XCEL NON-BLD 11X100MML (ENDOMECHANICALS) IMPLANT
TROCAR XCEL UNIV SLVE 11M 100M (ENDOMECHANICALS) IMPLANT
TUBING INSUFFLATION 10FT LAP (TUBING) ×3 IMPLANT

## 2015-01-23 NOTE — Care Management Note (Signed)
Case Management Note  Patient Details  Name: Beth Wilkins MRN: [id number] Date of Birth: 02-20-1941  Subjective/Objective:     74 yo admitted with SBO. Hx of dementia               Action/Plan: From home with children  Expected Discharge Date:   (unknown)               Expected Discharge Plan:  [place]  In-House Referral:     Discharge planning Services  CM Consult  Post Acute Care Choice:    Choice offered to:     DME Arranged:    DME Agency:     HH Arranged:    HH Agency:     Status of Service:  In process, will continue to follow  Medicare Important Message Given:  Yes Date Medicare IM Given:    Medicare IM give by:    Date Additional Medicare IM Given:    Additional Medicare Important Message give by:     If discussed at [place] of Stay Meetings, dates discussed:    Additional Comments: CM consult for [facility] needs. Pt potentially for surgery. Once pt is post-op a PT evaluation would be helpful to help with disposition planning. MD made aware. CM will continue to follow. Lynnell Catalan, RN 01/23/2015, 2:33 PM

## 2015-01-23 NOTE — Brief Op Note (Signed)
01/20/2015 - 01/23/2015  7:37 PM  PATIENT:  Beth Wilkins  74 y.o. female  PRE-OPERATIVE DIAGNOSIS:  Small bowel obstruction  POST-OPERATIVE DIAGNOSIS:  ruptured appendix  PROCEDURE:  Procedure(s): DIAGNOSTIC LAPAROSCOPY, EXPLORATORY LAPAROTOMY  RIGHT HEMI-COLECTOMY FOR OBSTRUCTION OF RIGHT TERMINAL ILEUM (N/A)  SURGEON:  Surgeon(s) and Role:    * [name], MD - Primary  PHYSICIAN ASSISTANT:   ASSISTANTS: none   ANESTHESIA:   general  EBL:  Total I/O In: -  Out: 150 [Urine:150]  BLOOD ADMINISTERED:none  DRAINS: none   LOCAL MEDICATIONS USED:  NONE  SPECIMEN:  Source of Specimen:  right colon and terminal ileum  DISPOSITION OF SPECIMEN:  PATHOLOGY  COUNTS:  YES  TOURNIQUET:  * No tourniquets in log *  DICTATION: .Other Dictation: Dictation Number [id number]  PLAN OF CARE: inpatient  PATIENT DISPOSITION:  PACU - hemodynamically stable.   Delay start of Pharmacological VTE agent (>24hrs) due to surgical blood loss or risk of bleeding: yes

## 2015-01-23 NOTE — Anesthesia Preprocedure Evaluation (Signed)
Anesthesia Evaluation  Patient identified by MRN, date of birth, ID band Patient awake    Reviewed: Allergy & Precautions, H&P , NPO status , Patient's Chart, lab work & pertinent test results  Airway Mallampati: II  TM Distance: >3 FB Neck ROM: full    Dental no notable dental hx. (+) Dental Advisory Given   Pulmonary neg pulmonary ROS, Current Smoker,    Pulmonary exam normal breath sounds clear to auscultation       Cardiovascular Exercise Tolerance: Good hypertension, On Medications Normal cardiovascular exam Rhythm:regular Rate:Normal     Neuro/Psych dementia negative neurological ROS  negative psych ROS   GI/Hepatic negative GI ROS, Neg liver ROS, GERD  Medicated and Controlled,SBO   Endo/Other  negative endocrine ROS  Renal/GU negative Renal ROS  negative genitourinary   Musculoskeletal   Abdominal   Peds  Hematology negative hematology ROS (+)   Anesthesia Other Findings   Reproductive/Obstetrics negative OB ROS                             Anesthesia Physical Anesthesia Plan  ASA: III  Anesthesia Plan: General   Post-op Pain Management:    Induction: Intravenous, Rapid sequence and Cricoid pressure planned  Airway Management Planned: Oral ETT  Additional Equipment:   Intra-op Plan:   Post-operative Plan: Extubation in OR  Informed Consent: I have reviewed the patients History and Physical, chart, labs and discussed the procedure including the risks, benefits and alternatives for the proposed anesthesia with the patient or authorized representative who has indicated his/her understanding and acceptance.   Dental Advisory Given  Plan Discussed with: CRNA and Surgeon  Anesthesia Plan Comments:         Anesthesia Quick Evaluation

## 2015-01-23 NOTE — Progress Notes (Signed)
[facility]                                                                              Patient Demographics  Beth Wilkins, is a 74 y.o. female, DOB - 05-12-1940, [id number]  Admit date - 01/20/2015   Admitting Physician Rise Patience, MD  Outpatient Primary MD for the patient is [name], MD  LOS - 2   Chief Complaint  Patient presents with  . Dehydration      HPI on 01/21/2015 by Dr. Gean Birchwood KAMARAH DOPSON is a 74 y.o. female with history of hypertension, hyperlipidemia, dementia was brought to the ER after patient has been having multiple episodes of nausea vomiting. As per patient's daughter who provided the history patient has been having decreased appetite and abdominal distention for almost 2-3 weeks now. Over the last 2-3 days patient has been having projectile vomiting which has not improved and patient was brought to the ER. CT abdomen and pelvis shows high-grade small bowel obstruction and on-call surgeon Dr. Excell Seltzer was consulted by the ER physician and patient has been admitted for further management. Patient has been placed on NG tube suction following which large amounts of fluid has been suctioned. As per the daughter patient has not complained of any abdominal pain as such. Patient does not recall her last bowel movement. Denies any chest pain or shortness of breath. Has not had any previous abdominal surgery as per the patient's daughter.   Assessment & Plan  High grade Small bowel obstruction -CT abd/pelvis: High-grade small bowel obstruction with transition point in the right lower quadrant -Continue NG tube, NPO, IVF, antiemetics, pain control -General surgery consulted and appreciated -Small bowel protocol did not show improvement in obstruction -KUB this morning: Persistent obstruction- no significant progression of contrast -Discussed with surgery -CEA pending -Pending further recommendations from surgery, patient  hesitant about surgery, daughter in agreement with surgery if needed  Acute renal failure -Upon admission, was 2.37  -Likely secondary to dehydration and poor oral intake -Creatinine improving, currently 1.28  Right-sided hydronephrosis -No obstructing lesions noted on CT: Right hydroureteronephrosis, no obstructing lesion or stone -Will continue to monitor creatinine closely  Multiple liver lesions -Noted on CT of the abdomen, patient will need outpatient follow-up and monitoring  Hypertension -Home medications held, continue IV hydralazine PRN  Hyperlipidemia -Home medications held due to NPO  Dementia -Home medications currently held due to NPO -Per daughter at bedside, patient has dementia, but is able to confabulate well  Leukocytosis -Improving, Likely secondary to SBO -continue to monitor CBC -patient is currently afebrile, will not start on antibiotics at this time  Hypokalemia -Likely secondary to nausea and vomiting, will replete and continue to monitor BMP -Magnesium 2.2  Code Status: Full  Family Communication: Daughter at bedside  Disposition Plan: Admitted.   Time Spent in minutes 30 minutes  Procedures  None  Consults  Gen. surgery  DVT Prophylaxis Heparin  Lab Results  Component Value Date   PLT 267 01/23/2015    Medications  Scheduled Meds: . potassium chloride  10 mEq Intravenous Q1 Hr x 4   Continuous Infusions: . sodium chloride 1,000 mL (01/22/15 2149)  .  dextrose 5 % and 0.45% NaCl Stopped (01/22/15 1725)   PRN Meds:.acetaminophen **OR** acetaminophen, hydrALAZINE, ondansetron **OR** ondansetron (ZOFRAN) IV, phenol  Antibiotics    Anti-infectives    None      Subjective:   [name] seen and examined today.  Patient states she is feeling better today. Denies abdominal pain, N/V.  Did have a small bowel movement.  Does not want surgery.  Denies chest pain, shortness of breath.  Objective:   Filed Vitals:    01/22/15 1950 01/22/15 2233 01/23/15 0440 01/23/15 1129  BP:  125/62 110/59   Pulse: 102 91 89   Temp:  98.5 F (36.9 C) 97.7 F (36.5 C)   TempSrc:  Oral Oral   Resp:  16 20   Height:      Weight:   41.005 kg (90 lb 6.4 oz) 47.174 kg (104 lb)  SpO2:  100% 100%     Wt Readings from Last 3 Encounters:  01/23/15 47.174 kg (104 lb)  09/02/14 54.432 kg (120 lb)  03/03/14 56.7 kg (125 lb)     Intake/Output Summary (Last 24 hours) at 01/23/15 1146 Last data filed at 01/23/15 1128  Gross per 24 hour  Intake   2615 ml  Output   1225 ml  Net   1390 ml    Exam  General: Well developed, well nourished, NAD, appears stated age  76: NCAT, mucous membranes moist. NGT in place  Cardiovascular: S1 S2 auscultated, no rubs, murmurs or gallops. Regular rate and rhythm.  Respiratory: Clear to auscultation bilaterally with equal chest rise  Abdomen: Soft, nontender, mildly distended, + bowel sounds  Extremities: warm dry without cyanosis clubbing or edema  Neuro: AAOx2 (person and place, not time.  Nonfocal  Psych: Normal affect and demeanor   Data Review   Micro Results No results found for this or any previous visit (from the past 240 hour(s)).  Radiology Reports Ct Abdomen Pelvis Wo Contrast  01/21/2015  CLINICAL DATA:  Abdominal distention for 4 days with nausea and vomiting. Decreased urine output. EXAM: CT ABDOMEN AND PELVIS WITHOUT CONTRAST TECHNIQUE: Multidetector CT imaging of the abdomen and pelvis was performed following the standard protocol without IV contrast. COMPARISON:  None. FINDINGS: Lower chest:  The included lung bases are clear. Liver: Ladder density lesion in the anterior subcapsular left lobe measures 3.6 cm and is consistent with simple cyst. Additional smaller hypodense lesions in the left and anterior right lobe are incompletely characterized without contrast. Hepatobiliary: Gallbladder physiologically distended, displaced laterally by dilated stomach.  No calcified gallstone. Pancreas: Normal.  No ductal dilatation or surrounding inflammation. Spleen: Small soft tissue density in the left upper quadrant, likely diminutive spleen. Adrenal glands: No nodule. Kidneys: Multiple bilateral low-density lesions in both kidneys consistent with cysts. Mild right hydroureteronephrosis, distal ureter is suboptimally defined. There is an nonobstructing stone in the lower right kidney. No ureteral calculi. No left hydronephrosis. Stomach/Bowel: Stomach markedly distended with fluid. Diffuse fluid-filled dilated small bowel, measuring up to 4.1 cm. Transition point suspected in the right lower quadrant, however no definite decompressed small bowel is seen. No pneumatosis. Small volume of stool throughout the colon without colonic wall thickening. The ileocecal valve not well-defined. The appendix is not definitively seen. Vascular/Lymphatic: No retroperitoneal adenopathy. Abdominal aorta is normal in caliber. Dense atherosclerosis of the abdominal aorta and its branches. Reproductive: Uterus appears in situ, suboptimally defined due to fluid-filled adjacent bowel loops. Limited assessment for adnexal mass. Bladder: Minimally distended, irregular/tubular in shape. No  wall thickening. Displaced posteriorly due to dilated small bowel. Other: Mild mesenteric edema centrally.  No free air or free fluid. Musculoskeletal: There are no acute or suspicious osseous abnormalities. Advanced facet arthropathy in the lumbar spine. Transitional lumbosacral anatomy. IMPRESSION: 1. High-grade small bowel obstruction with transition point in the right lower quadrant. No definite decompressed small bowel loops are seen, suspect distal ileum a site of obstruction. No perforation, pneumatosis or free fluid. 2. Right hydroureteronephrosis, however no obstructing lesion or stone. There is a nonobstructing stone in the lower right kidney. Multiple bilateral renal cysts. 3. Multiple liver lesions,  largest represents a simple cyst. Smaller lesions are incompletely characterized without contrast. Electronically Signed   By: Jeb Levering M.D.   On: 01/21/2015 03:26   Dg Abd Portable 1v  01/23/2015  CLINICAL DATA:  Small bowel obstruction EXAM: PORTABLE ABDOMEN - 1 VIEW COMPARISON:  Abdominal series of January 22, 2015 FINDINGS: This single supine abdominal film reveals several moderately distended air-filled and fluid-filled bowel loops. This is predominantly small bowel. Contrast with things will within these loops is present from the CT scan of 3 days ago. There is a small amount of gas within portions of the colon. There is no definite rectal gas. The nasogastric tube tip lies in the pylorus or proximal duodenum with the proximal port in the pre-pyloric region. No free extraluminal gas is demonstrated. IMPRESSION: Persistent distal small bowel obstruction. There has not been significant progression of contrast into the colon since the abdominal CT scan of 15 November. Electronically Signed   By: David  Martinique M.D.   On: 01/23/2015 07:09   Dg Abd Portable 1v  01/22/2015  CLINICAL DATA:  Nausea, vomiting, abdominal distension, small bowel obstruction, hypertension, dementia, GERD EXAM: PORTABLE ABDOMEN - 1 VIEW COMPARISON:  01/21/2015 FINDINGS: Contrast opacifies multiple dilated small bowel loops. Persistent small bowel dilatation unchanged previous exam. Nasogastric tube coiled in stomach with tip projecting over pylorus or proximal duodenum. No definite bowel wall thickening. No significant colonic gas or stool. Bones demineralized. IMPRESSION: Persistent small bowel dilatation compatible with obstruction. Electronically Signed   By: Lavonia Dana M.D.   On: 01/22/2015 07:51   Dg Abd Portable 1v-small Bowel Obstruction Protocol-initial, 8 Hr Delay  01/21/2015  CLINICAL DATA:  8 hour delay film EXAM: PORTABLE ABDOMEN - 1 VIEW COMPARISON:  Earlier film of the same day FINDINGS: Nasogastric tube  tip probably just beyond the pylorus. Multiple dilated small bowel loops as before. Some enteral contrast is now evident, predominantly in dilated small bowel loops. The colon appears decompressed. Degenerative changes in the lower lumbar spine. IMPRESSION: 1. Stable nasogastric tube into the proximal duodenum, with little change in appearance of small bowel obstruction. Electronically Signed   By: Lucrezia Europe M.D.   On: 01/21/2015 17:39   Dg Abd Portable 1v-small Bowel Protocol-position Verification  01/21/2015  CLINICAL DATA:  Check nasogastric catheter placement EXAM: PORTABLE ABDOMEN - 1 VIEW COMPARISON:  01/20/2015 FINDINGS: A nasogastric catheter is noted coiled within the stomach. The tip is directed towards the pyloric channel. The stomach has been decompressed when compared with prior exam. The small bowel remains dilated similar to that seen on recent CT examination. Degenerative changes of lumbar spine are seen. IMPRESSION: Stable small bowel dilatation. Nasogastric catheter within the stomach. Electronically Signed   By: Inez Catalina M.D.   On: 01/21/2015 08:38    CBC  Recent Labs Lab 01/20/15 2247 01/21/15 0745 01/22/15 0350 01/23/15 0355  WBC 12.9* 13.6* 11.8*  10.9*  HGB 13.5 12.0 11.9* 11.5*  HCT 38.8 34.7* 35.2* 33.5*  PLT 319 298 291 267  MCV 83.8 83.4 86.1 87.0  MCH 29.2 28.8 29.1 29.9  MCHC 34.8 34.6 33.8 34.3  RDW 14.5 14.5 14.9 15.1  LYMPHSABS 2.3 1.5  --   --   MONOABS 0.8 0.6  --   --   EOSABS 0.0 0.0  --   --   BASOSABS 0.0 0.0  --   --     Chemistries   Recent Labs Lab 01/20/15 2247 01/21/15 0745 01/22/15 0350 01/23/15 0355  NA 129* 130* 136 138  K 3.7 3.1* 3.2* 3.3*  CL 82* 88* 97* 98*  CO2 30 25 25 26   GLUCOSE 122* 97 85 79  BUN 74* 67* 52* 38*  CREATININE 2.37* 1.91* 1.56* 1.28*  CALCIUM 9.2 8.4* 8.4* 8.6*  MG  --   --  2.2  --   AST 27 25  --   --   ALT 23 22  --   --   ALKPHOS 74 66  --   --   BILITOT 1.3* 1.4*  --   --     ------------------------------------------------------------------------------------------------------------------ estimated creatinine clearance is 29.6 mL/min (by C-G formula based on Cr of 1.28). ------------------------------------------------------------------------------------------------------------------ No results for input(s): HGBA1C in the last 72 hours. ------------------------------------------------------------------------------------------------------------------ No results for input(s): CHOL, HDL, LDLCALC, TRIG, CHOLHDL, LDLDIRECT in the last 72 hours. ------------------------------------------------------------------------------------------------------------------ No results for input(s): TSH, T4TOTAL, T3FREE, THYROIDAB in the last 72 hours.  Invalid input(s): FREET3 ------------------------------------------------------------------------------------------------------------------ No results for input(s): VITAMINB12, FOLATE, FERRITIN, TIBC, IRON, RETICCTPCT in the last 72 hours.  Coagulation profile No results for input(s): INR, PROTIME in the last 168 hours.  No results for input(s): DDIMER in the last 72 hours.  Cardiac Enzymes No results for input(s): CKMB, TROPONINI, MYOGLOBIN in the last 168 hours.  Invalid input(s): CK ------------------------------------------------------------------------------------------------------------------ Invalid input(s): POCBNP    Nelline Lio D.O. on 01/23/2015 at 11:46 AM  Between 7am to 7pm - Pager - [phone]  After 7pm go to [email or web address] - password [email or web address]  And look for the night coverage person covering for me after hours  [facility] Office  [phone]

## 2015-01-23 NOTE — Interval H&P Note (Signed)
History and Physical Interval Note:  01/23/2015 3:53 PM  Beth Wilkins  has presented today for surgery, with the diagnosis of Small bowel obstruction  The various methods of treatment have been discussed with the patient and family. After consideration of risks, benefits and other options for treatment, the patient has consented to  Procedure(s): LAPAROSCOPY DIAGNOSTIC, possible open, possible bowel resection, possible ostomy (N/A) as a surgical intervention .  The patient's history has been reviewed, patient examined, no change in status, stable for surgery.  I have reviewed the patient's chart and labs.  Questions were answered to the patient's satisfaction.     [name] B

## 2015-01-23 NOTE — H&P (View-Only) (Signed)
[facility] Surgery Consult Note  SARAJEAN DESSERT [birth year]  [id number].    Requesting MD: Dr. Hal Hope Chief Complaint/Reason for Consult: SBO  HPI:  74 y/o AA female with PMH HTN/HLD, dementia, and GERD presented to [facility] with 2 week h/o nausea/vomiting, abdominal distension, limited bowel function, decreased urine output.  5 days ago she had projectile vomiting.  She asw Dr. Drema Dallas her PCP int his office since the symptoms persisted.  The PCP was concerned for kidney failure and thus she was referred to [facility] for further evaluation and managmeent.  She denies abdominal pain, CP/SOB, fever, diarrhea, constipation.  Has had 9lb weight loss over the past 2 months and anorexia.  Last BM unknown, but patient is usually very regular every morning.  Her dementia is an issue and she has been a bit confused which is why she has mitts on her hands.  Her daughter is at bedside able to recall the events over the last 2 weeks.  An NG tube has been placed.  No past surgical history, no hernias or abdominal infections.  No precipitating or alleviating factors, no radiating pains.     ROS: All systems reviewed and otherwise negative except for as above  Family History  Problem Relation Age of Onset  . Diabetes Mellitus II Father   . Cervical cancer Other     Past Medical History  Diagnosis Date  . GERD (gastroesophageal reflux disease)   . Hypertension   . Hypercholesteremia   . Pelvis fracture ([place]) 01/31/2013  . Dementia     Past Surgical History  Procedure Laterality Date  . Cataract extraction, bilateral      Social History:  reports that she has been smoking Cigarettes.  She has never used smokeless tobacco. She reports that she does not drink alcohol or use illicit drugs.  Allergies:  Allergies  Allergen Reactions  . Namenda [Memantine Hcl] Other (See Comments)    hallucinations  . Percocet [Oxycodone-Acetaminophen] Other (See Comments)    hallucinations     Medications Prior to Admission  Medication Sig Dispense Refill  . esomeprazole (NEXIUM) 40 MG capsule Take 40 mg by mouth daily at 12 noon.    . potassium chloride SA (K-DUR,KLOR-CON) 20 MEQ tablet Take 20 mEq by mouth daily.    . simvastatin (ZOCOR) 20 MG tablet Take 20 mg by mouth daily at 6 PM.    . triamterene-hydrochlorothiazide (MAXZIDE-25) 37.5-25 MG per tablet Take 1 tablet by mouth daily.    Marland Kitchen acetaminophen (TYLENOL) 325 MG tablet Take 2 tablets (650 mg total) by mouth every 6 (six) hours as needed for mild pain (or Fever >/= 101). (Patient not taking: Reported on 01/20/2015)    . Cholecalciferol (VITAMIN D3) 2000 UNITS TABS Take by mouth daily.    . Coenzyme Q10 (CO Q 10) 100 MG CAPS Take by mouth daily.    Marland Kitchen donepezil (ARICEPT) 10 MG tablet Take 1 tab daily (Patient not taking: Reported on 01/20/2015) 30 tablet 11  . donepezil (ARICEPT) 10 MG tablet TAKE 1 TAB DAILY (Patient not taking: Reported on 01/20/2015) 30 tablet 2  . Ginkgo Biloba (GINKOBA PO) Take 120 mg by mouth daily.    . MULTIPLE VITAMIN PO Take by mouth daily.    . traMADol (ULTRAM) 50 MG tablet Take 1 tablet (50 mg total) by mouth every 6 (six) hours as needed for severe pain. (Patient not taking: Reported on 01/20/2015) 30 tablet   . vitamin B-12 (CYANOCOBALAMIN) 1000 MCG tablet Take 1,000  mcg by mouth daily.      Blood pressure 128/68, pulse 116, temperature 97.9 F (36.6 C), temperature source Oral, resp. rate 24, height $RemoveBe'5\' 5"'iczCgOuya$  (1.651 m), weight 55.157 kg (121 lb 9.6 oz), SpO2 97 %. Physical Exam: General: pleasant, WD/WN AA female who is laying in bed in NAD, mitts on hands HEENT: head is normocephalic, atraumatic.  Sclera are noninjected.  PERRL.  Ears and nose without any masses or lesions.  Mouth is pink and moist Heart: regular, rate, and rhythm.  No obvious murmurs, gallops, or rubs noted.  Palpable pedal pulses bilaterally Lungs: CTAB, no wheezes, rhonchi, or rales noted.  Respiratory effort  nonlabored Abd: soft, distended, non-tender, diminished BS, no masses, hernias, or organomegaly, no surgical scars MS: all 4 extremities are symmetrical with no cyanosis, clubbing, or edema. Skin: warm and dry with no masses, lesions, or rashes Psych: Awake and alert, some confusion, speech is fluent   Results for orders placed or performed during the hospital encounter of 01/20/15 (from the past 48 hour(s))  Urinalysis, Routine w reflex microscopic (not at [facility])     Status: Abnormal   Collection Time: 01/20/15 10:34 PM  Result Value Ref Range   Color, Urine YELLOW YELLOW   APPearance CLOUDY (A) CLEAR   Specific Gravity, Urine 1.018 1.005 - 1.030   pH 5.0 5.0 - 8.0   Glucose, UA NEGATIVE NEGATIVE mg/dL   Hgb urine dipstick TRACE (A) NEGATIVE   Bilirubin Urine MODERATE (A) NEGATIVE   Ketones, ur NEGATIVE NEGATIVE mg/dL   Protein, ur NEGATIVE NEGATIVE mg/dL   Nitrite NEGATIVE NEGATIVE   Leukocytes, UA SMALL (A) NEGATIVE  Urine microscopic-add on     Status: Abnormal   Collection Time: 01/20/15 10:34 PM  Result Value Ref Range   Squamous Epithelial / LPF TOO NUMEROUS TO COUNT (A) NONE SEEN    Comment: Please note change in reference range.   WBC, UA 0-5 0 - 5 WBC/hpf    Comment: Please note change in reference range.   RBC / HPF NONE SEEN 0 - 5 RBC/hpf    Comment: Please note change in reference range.   Bacteria, UA MANY (A) NONE SEEN    Comment: Please note change in reference range.  Comprehensive metabolic panel     Status: Abnormal   Collection Time: 01/20/15 10:47 PM  Result Value Ref Range   Sodium 129 (L) 135 - 145 mmol/L   Potassium 3.7 3.5 - 5.1 mmol/L   Chloride 82 (L) 101 - 111 mmol/L   CO2 30 22 - 32 mmol/L   Glucose, Bld 122 (H) 65 - 99 mg/dL   BUN 74 (H) 6 - 20 mg/dL   Creatinine, Ser 2.37 (H) 0.44 - 1.00 mg/dL   Calcium 9.2 8.9 - 10.3 mg/dL   Total Protein 7.9 6.5 - 8.1 g/dL   Albumin 4.5 3.5 - 5.0 g/dL   AST 27 15 - 41 U/L   ALT 23 14 - 54 U/L   Alkaline  Phosphatase 74 38 - 126 U/L   Total Bilirubin 1.3 (H) 0.3 - 1.2 mg/dL   GFR calc non Af Amer 19 (L) >60 mL/min   GFR calc Af Amer 22 (L) >60 mL/min    Comment: (NOTE) The eGFR has been calculated using the CKD EPI equation. This calculation has not been validated in all clinical situations. eGFR's persistently <60 mL/min signify possible Chronic Kidney Disease.    Anion gap 17 (H) 5 - 15  CBC with Differential/Platelet  Status: Abnormal   Collection Time: 01/20/15 10:47 PM  Result Value Ref Range   WBC 12.9 (H) 4.0 - 10.5 K/uL   RBC 4.63 3.87 - 5.11 MIL/uL   Hemoglobin 13.5 12.0 - 15.0 g/dL   HCT 38.8 36.0 - 46.0 %   MCV 83.8 78.0 - 100.0 fL   MCH 29.2 26.0 - 34.0 pg   MCHC 34.8 30.0 - 36.0 g/dL   RDW 14.5 11.5 - 15.5 %   Platelets 319 150 - 400 K/uL   Neutrophils Relative % 76 %   Lymphocytes Relative 18 %   Monocytes Relative 6 %   Eosinophils Relative 0 %   Basophils Relative 0 %   Neutro Abs 9.8 (H) 1.7 - 7.7 K/uL   Lymphs Abs 2.3 0.7 - 4.0 K/uL   Monocytes Absolute 0.8 0.1 - 1.0 K/uL   Eosinophils Absolute 0.0 0.0 - 0.7 K/uL   Basophils Absolute 0.0 0.0 - 0.1 K/uL   RBC Morphology BURR CELLS   Lipase, blood     Status: Abnormal   Collection Time: 01/20/15 10:47 PM  Result Value Ref Range   Lipase 55 (H) 11 - 51 U/L  Glucose, capillary     Status: None   Collection Time: 01/21/15  7:44 AM  Result Value Ref Range   Glucose-Capillary 91 65 - 99 mg/dL   Comment 1 Notify RN   Comprehensive metabolic panel     Status: Abnormal   Collection Time: 01/21/15  7:45 AM  Result Value Ref Range   Sodium 130 (L) 135 - 145 mmol/L   Potassium 3.1 (L) 3.5 - 5.1 mmol/L   Chloride 88 (L) 101 - 111 mmol/L   CO2 25 22 - 32 mmol/L   Glucose, Bld 97 65 - 99 mg/dL   BUN 67 (H) 6 - 20 mg/dL   Creatinine, Ser 1.91 (H) 0.44 - 1.00 mg/dL   Calcium 8.4 (L) 8.9 - 10.3 mg/dL   Total Protein 6.9 6.5 - 8.1 g/dL   Albumin 3.9 3.5 - 5.0 g/dL   AST 25 15 - 41 U/L   ALT 22 14 - 54 U/L    Alkaline Phosphatase 66 38 - 126 U/L   Total Bilirubin 1.4 (H) 0.3 - 1.2 mg/dL   GFR calc non Af Amer 25 (L) >60 mL/min   GFR calc Af Amer 29 (L) >60 mL/min    Comment: (NOTE) The eGFR has been calculated using the CKD EPI equation. This calculation has not been validated in all clinical situations. eGFR's persistently <60 mL/min signify possible Chronic Kidney Disease.    Anion gap 17 (H) 5 - 15  CBC WITH DIFFERENTIAL     Status: Abnormal   Collection Time: 01/21/15  7:45 AM  Result Value Ref Range   WBC 13.6 (H) 4.0 - 10.5 K/uL   RBC 4.16 3.87 - 5.11 MIL/uL   Hemoglobin 12.0 12.0 - 15.0 g/dL   HCT 34.7 (L) 36.0 - 46.0 %   MCV 83.4 78.0 - 100.0 fL   MCH 28.8 26.0 - 34.0 pg   MCHC 34.6 30.0 - 36.0 g/dL   RDW 14.5 11.5 - 15.5 %   Platelets 298 150 - 400 K/uL   Neutrophils Relative % 85 %   Neutro Abs 11.5 (H) 1.7 - 7.7 K/uL   Lymphocytes Relative 11 %   Lymphs Abs 1.5 0.7 - 4.0 K/uL   Monocytes Relative 4 %   Monocytes Absolute 0.6 0.1 - 1.0 K/uL   Eosinophils Relative 0 %  Eosinophils Absolute 0.0 0.0 - 0.7 K/uL   Basophils Relative 0 %   Basophils Absolute 0.0 0.0 - 0.1 K/uL   Ct Abdomen Pelvis Wo Contrast  01/21/2015  CLINICAL DATA:  Abdominal distention for 4 days with nausea and vomiting. Decreased urine output. EXAM: CT ABDOMEN AND PELVIS WITHOUT CONTRAST TECHNIQUE: Multidetector CT imaging of the abdomen and pelvis was performed following the standard protocol without IV contrast. COMPARISON:  None. FINDINGS: Lower chest:  The included lung bases are clear. Liver: Ladder density lesion in the anterior subcapsular left lobe measures 3.6 cm and is consistent with simple cyst. Additional smaller hypodense lesions in the left and anterior right lobe are incompletely characterized without contrast. Hepatobiliary: Gallbladder physiologically distended, displaced laterally by dilated stomach. No calcified gallstone. Pancreas: Normal.  No ductal dilatation or surrounding  inflammation. Spleen: Small soft tissue density in the left upper quadrant, likely diminutive spleen. Adrenal glands: No nodule. Kidneys: Multiple bilateral low-density lesions in both kidneys consistent with cysts. Mild right hydroureteronephrosis, distal ureter is suboptimally defined. There is an nonobstructing stone in the lower right kidney. No ureteral calculi. No left hydronephrosis. Stomach/Bowel: Stomach markedly distended with fluid. Diffuse fluid-filled dilated small bowel, measuring up to 4.1 cm. Transition point suspected in the right lower quadrant, however no definite decompressed small bowel is seen. No pneumatosis. Small volume of stool throughout the colon without colonic wall thickening. The ileocecal valve not well-defined. The appendix is not definitively seen. Vascular/Lymphatic: No retroperitoneal adenopathy. Abdominal aorta is normal in caliber. Dense atherosclerosis of the abdominal aorta and its branches. Reproductive: Uterus appears in situ, suboptimally defined due to fluid-filled adjacent bowel loops. Limited assessment for adnexal mass. Bladder: Minimally distended, irregular/tubular in shape. No wall thickening. Displaced posteriorly due to dilated small bowel. Other: Mild mesenteric edema centrally.  No free air or free fluid. Musculoskeletal: There are no acute or suspicious osseous abnormalities. Advanced facet arthropathy in the lumbar spine. Transitional lumbosacral anatomy. IMPRESSION: 1. High-grade small bowel obstruction with transition point in the right lower quadrant. No definite decompressed small bowel loops are seen, suspect distal ileum a site of obstruction. No perforation, pneumatosis or free fluid. 2. Right hydroureteronephrosis, however no obstructing lesion or stone. There is a nonobstructing stone in the lower right kidney. Multiple bilateral renal cysts. 3. Multiple liver lesions, largest represents a simple cyst. Smaller lesions are incompletely characterized  without contrast. Electronically Signed   By: Jeb Levering M.D.   On: 01/21/2015 03:26   Dg Abd Portable 1v-small Bowel Protocol-position Verification  01/21/2015  CLINICAL DATA:  Check nasogastric catheter placement EXAM: PORTABLE ABDOMEN - 1 VIEW COMPARISON:  01/20/2015 FINDINGS: A nasogastric catheter is noted coiled within the stomach. The tip is directed towards the pyloric channel. The stomach has been decompressed when compared with prior exam. The small bowel remains dilated similar to that seen on recent CT examination. Degenerative changes of lumbar spine are seen. IMPRESSION: Stable small bowel dilatation. Nasogastric catheter within the stomach. Electronically Signed   By: Inez Catalina M.D.   On: 01/21/2015 08:38      Assessment/Plan High grade SBO -Improved since NG tube placement -SB protocol to see if contrast goes through -No h/o abdominal surgery or infection, concern if she doesn't continue to improve she may require surgical interventions -IS, and Ambulate to aid in recovery of bowel function -NPO, NG tube, IVF, pain control, antiemetics -No need for antibiotics at this time. Leukocytosis - 13.6  Acute renal failure Right-sided hydronephrosis Multiple liver lesions HTN  HLD Dementia DVT Proph - SCDs, okay to be on SQ heparin   Nat Christen, [facility] Surgery 01/21/2015, 9:52 AM Pager: [phone]

## 2015-01-23 NOTE — Transfer of Care (Signed)
Immediate Anesthesia Transfer of Care Note  Patient: Beth Wilkins  Procedure(s) Performed: Procedure(s): DIAGNOSTIC LAPAROSCOPY, EXPLORATORY LAPAROTOMY  RIGHT HEMI-COLECTOMY FOR OBSTRUCTION OF RIGHT TERMINAL ILEUM (N/A)  Patient Location: PACU  Anesthesia Type:General  Level of Consciousness:  sedated, patient cooperative and responds to stimulation  Airway & Oxygen Therapy:Patient Spontanous Breathing and Patient connected to face mask oxgen  Post-op Assessment:  Report given to PACU RN and Post -op Vital signs reviewed and stable  Post vital signs:  Reviewed and stable  Last Vitals:  Filed Vitals:   01/23/15 1323  BP: 123/62  Pulse: 91  Temp: 36.7 C  Resp: 20    Complications: No apparent anesthesia complications

## 2015-01-23 NOTE — Care Management Important Message (Signed)
Important Message  Patient Details  Name: KIKO FICO MRN: [id number] Date of Birth: 10/22/1940 Important Message  Patient Details  Name: REILEY UPRIGHT MRN: [id number] Date of Birth: 1940-05-06   Medicare Important Message Given:  Yes    Shelda Altes 01/23/2015, 12:25 PM   Medicare Important Message Given:  Yes    Shelda Altes 01/23/2015, 12:25 PM

## 2015-01-23 NOTE — Progress Notes (Signed)
[facility] Progress Note     Subjective: Pt says she feels fine, but still bloated.  No pain or N/V since tube placed.  Ambulated some OOB, had a tiny BM with help of dulcolax suppository.  Patient and her daughter are arguing at bedside, because the patient doesn't understand why she needs surgery. All 3 kids were here yesterday and they all decided on proceeding with surgery if needed, but the patient doesn't remember this.  Daughter says she had the HCPOA secondary to the patients dementia.  Objective: Vital signs in last 24 hours: Temp:  [97.5 F (36.4 C)-98.5 F (36.9 C)] 97.7 F (36.5 C) (11/18 0440) Pulse Rate:  [87-102] 89 (11/18 0440) Resp:  [16-20] 20 (11/18 0440) BP: (110-146)/(59-74) 110/59 mmHg (11/18 0440) SpO2:  [100 %] 100 % (11/18 0440) Weight:  [41.005 kg (90 lb 6.4 oz)] 41.005 kg (90 lb 6.4 oz) (11/18 0440) Last BM Date: 01/22/15 (per charting )  Intake/Output from previous day: 11/17 0701 - 11/18 0700 In: 1855 [P.O.:220; I.V.:785; NG/GT:850] Out: 550 [Urine:300; Emesis/NG output:250] Intake/Output this shift: Total I/O In: [id number] [P.O.:100; I.V.:785; NG/GT:850] Out: 300 [Urine:300]  PE: Gen:  Alert, NAD, pleasant Abd: Soft, distended, not tender, High pitched BS only, no HSM, no surgical scars noted (no h/o surgery)  Lab Results:   Recent Labs  01/22/15 0350 01/23/15 0355  WBC 11.8* 10.9*  HGB 11.9* 11.5*  HCT 35.2* 33.5*  PLT 291 267   BMET  Recent Labs  01/22/15 0350 01/23/15 0355  NA 136 138  K 3.2* 3.3*  CL 97* 98*  CO2 25 26  GLUCOSE 85 79  BUN 52* 38*  CREATININE 1.56* 1.28*  CALCIUM 8.4* 8.6*   PT/INR No results for input(s): LABPROT, INR in the last 72 hours. CMP     Component Value Date/Time   NA 138 01/23/2015 0355   K 3.3* 01/23/2015 0355   CL 98* 01/23/2015 0355   CO2 26 01/23/2015 0355   GLUCOSE 79 01/23/2015 0355   BUN 38* 01/23/2015 0355   CREATININE 1.28* 01/23/2015 0355   CALCIUM 8.6* 01/23/2015  0355   PROT 6.9 01/21/2015 0745   ALBUMIN 3.9 01/21/2015 0745   AST 25 01/21/2015 0745   ALT 22 01/21/2015 0745   ALKPHOS 66 01/21/2015 0745   BILITOT 1.4* 01/21/2015 0745   GFRNONAA 41* 01/23/2015 0355   GFRAA 47* 01/23/2015 0355   Lipase     Component Value Date/Time   LIPASE 55* 01/20/2015 2247       Studies/Results: Dg Abd Portable 1v  01/22/2015  CLINICAL DATA:  Nausea, vomiting, abdominal distension, small bowel obstruction, hypertension, dementia, GERD EXAM: PORTABLE ABDOMEN - 1 VIEW COMPARISON:  01/21/2015 FINDINGS: Contrast opacifies multiple dilated small bowel loops. Persistent small bowel dilatation unchanged previous exam. Nasogastric tube coiled in stomach with tip projecting over pylorus or proximal duodenum. No definite bowel wall thickening. No significant colonic gas or stool. Bones demineralized. IMPRESSION: Persistent small bowel dilatation compatible with obstruction. Electronically Signed   By: Lavonia Dana M.D.   On: 01/22/2015 07:51   Dg Abd Portable 1v-small Bowel Obstruction Protocol-initial, 8 Hr Delay  01/21/2015  CLINICAL DATA:  8 hour delay film EXAM: PORTABLE ABDOMEN - 1 VIEW COMPARISON:  Earlier film of the same day FINDINGS: Nasogastric tube tip probably just beyond the pylorus. Multiple dilated small bowel loops as before. Some enteral contrast is now evident, predominantly in dilated small bowel loops. The colon appears decompressed. Degenerative changes in the lower  lumbar spine. IMPRESSION: 1. Stable nasogastric tube into the proximal duodenum, with little change in appearance of small bowel obstruction. Electronically Signed   By: Lucrezia Europe M.D.   On: 01/21/2015 17:39   Dg Abd Portable 1v-small Bowel Protocol-position Verification  01/21/2015  CLINICAL DATA:  Check nasogastric catheter placement EXAM: PORTABLE ABDOMEN - 1 VIEW COMPARISON:  01/20/2015 FINDINGS: A nasogastric catheter is noted coiled within the stomach. The tip is directed towards the  pyloric channel. The stomach has been decompressed when compared with prior exam. The small bowel remains dilated similar to that seen on recent CT examination. Degenerative changes of lumbar spine are seen. IMPRESSION: Stable small bowel dilatation. Nasogastric catheter within the stomach. Electronically Signed   By: Inez Catalina M.D.   On: 01/21/2015 08:38    Anti-infectives: Anti-infectives    None       Assessment/Plan High grade SBO, HD #3/4 -Clinically improved since NG tube placement, but xrays not showing much improvement -SB protocol 8 hr/24 hr delay were not improved, and no contrast in colon, repeat this am.  -No h/o abdominal surgery or infection.  I don't see enough improvement and I'm concerned she needs at least a diagnostic laparoscopy possible open, possible bowel resection. -Family (3 siblings) have HCPOA due to their mom's dementia.  They have the paperwork.  Apparently the patient is very good at confabulating and making it seem as though she is clear minded.  The family is all in agreement that she needs to proceed with surgery.  The patient is hesitant, but ultimately agrees with her children.   -IS, and Ambulate to aid in recovery of bowel function -NPO, NG tube (clamp to walk), IVF, pain control, antiemetics -No need for antibiotics at this time - WBC trending down. -Ordered CEA, some concern for malignancy, but none seen on CT.  She has had a CSP by [facility] 3-4 years ago and recently did a heme occult send out test which was negative for blood.  Leukocytosis - down to 10.9 Acute renal failure - Cr 1.28 Hypokalemia - supplementing Right-sided hydronephrosis Multiple liver lesions HTN HLD Dementia DVT Proph - SCDs, SQ heparin    LOS: 2 days    Nat Christen 01/23/2015, 7:00 AM Pager: [phone]

## 2015-01-23 NOTE — Anesthesia Procedure Notes (Signed)
Procedure Name: Intubation Date/Time: 01/23/2015 4:31 PM Performed by: Lajuana Carry E Pre-anesthesia Checklist: Patient identified, Emergency Drugs available, Suction available and Patient being monitored Patient Re-evaluated:Patient Re-evaluated prior to inductionOxygen Delivery Method: Circle System Utilized Preoxygenation: Pre-oxygenation with 100% oxygen Intubation Type: IV induction, Rapid sequence and Cricoid Pressure applied Ventilation: Mask ventilation without difficulty Laryngoscope Size: Miller and 2 Grade View: Grade I Tube type: Oral Tube size: 6.5 mm Number of attempts: 1 Placement Confirmation: ETT inserted through vocal cords under direct vision,  positive ETCO2 and breath sounds checked- equal and bilateral Secured at: 21 cm Tube secured with: Tape Dental Injury: Teeth and Oropharynx as per pre-operative assessment

## 2015-01-24 DIAGNOSIS — K352 Acute appendicitis with generalized peritonitis: Secondary | ICD-10-CM

## 2015-01-24 LAB — GLUCOSE, CAPILLARY
GLUCOSE-CAPILLARY: 127 mg/dL — AB (ref 65–99)
GLUCOSE-CAPILLARY: 158 mg/dL — AB (ref 65–99)
GLUCOSE-CAPILLARY: 186 mg/dL — AB (ref 65–99)
Glucose-Capillary: 160 mg/dL — ABNORMAL HIGH (ref 65–99)
Glucose-Capillary: 177 mg/dL — ABNORMAL HIGH (ref 65–99)
Glucose-Capillary: 133 mg/dL — ABNORMAL HIGH (ref 65–99)

## 2015-01-24 LAB — CBC
HCT: 33 % — ABNORMAL LOW (ref 36.0–46.0)
Hemoglobin: 11.3 g/dL — ABNORMAL LOW (ref 12.0–15.0)
MCH: 29.7 pg (ref 26.0–34.0)
MCHC: 34.2 g/dL (ref 30.0–36.0)
MCV: 86.6 fL (ref 78.0–100.0)
PLATELETS: 251 10*3/uL (ref 150–400)
RBC: 3.81 MIL/uL — ABNORMAL LOW (ref 3.87–5.11)
RDW: 15.2 % (ref 11.5–15.5)
WBC: 10.5 10*3/uL (ref 4.0–10.5)

## 2015-01-24 LAB — BASIC METABOLIC PANEL
Anion gap: 11 (ref 5–15)
BUN: 30 mg/dL — AB (ref 6–20)
CALCIUM: 7.9 mg/dL — AB (ref 8.9–10.3)
CO2: 24 mmol/L (ref 22–32)
CREATININE: 1.18 mg/dL — AB (ref 0.44–1.00)
Chloride: 102 mmol/L (ref 101–111)
GFR, EST AFRICAN AMERICAN: 52 mL/min — AB (ref >60)
GFR, EST NON AFRICAN AMERICAN: 45 mL/min — AB (ref >60)
Glucose, Bld: 190 mg/dL — ABNORMAL HIGH (ref 65–99)
Potassium: 4 mmol/L (ref 3.5–5.1)
SODIUM: 137 mmol/L (ref 135–145)

## 2015-01-24 LAB — CEA: CEA: 5.7 ng/mL — ABNORMAL HIGH (ref 0.0–4.7)

## 2015-01-24 MED ORDER — MORPHINE SULFATE (PF) 2 MG/ML IV SOLN
1.0000 mg | INTRAVENOUS | Status: DC | PRN
  Administered 2015-01-24 – 2015-01-25 (×3): 1 mg via INTRAVENOUS
  Filled 2015-01-24 (×4): qty 1

## 2015-01-24 NOTE — Progress Notes (Signed)
Patient ID: Beth Wilkins, female   DOB: 06-Jan-1941, 74 y.o.   MRN: [id number]  [facility]  POD#: 1  Subjective: Patient in bed, family at bedside.  No complaints.  Objective: Vital signs in last 24 hours: Temp:  [97.4 F (36.3 C)-98.7 F (37.1 C)] 98.5 F (36.9 C) (11/19 0445) Pulse Rate:  [91-115] 111 (11/19 0445) Resp:  [14-20] 20 (11/19 0445) BP: (97-133)/(50-80) 105/50 mmHg (11/19 0445) SpO2:  [100 %] 100 % (11/19 0445) Weight:  [47.174 kg (104 lb)] 47.174 kg (104 lb) (11/18 1129) Last BM Date: 01/22/15  Intake/Output from previous day: 11/18 0701 - 11/19 0700 In: 3779.8 [I.V.:3213.8] Out: 1840 [Urine:415; Emesis/NG output:825] Intake/Output this shift: Total I/O In: 810 [I.V.:810] Out: 650 [Emesis/NG output:650]  Physical Exam: HEENT - sclerae clear, mucous membranes moist Neck - soft Chest - clear bilaterally Cor - RRR Abdomen - soft, mild distension; VAC dressing in place, intact; BS present Ext - no edema, non-tender  Lab Results:   Recent Labs  01/23/15 0355 01/24/15 0410  WBC 10.9* 10.5  HGB 11.5* 11.3*  HCT 33.5* 33.0*  PLT 267 251   BMET  Recent Labs  01/23/15 0355 01/24/15 0410  NA 138 137  K 3.3* 4.0  CL 98* 102  CO2 26 24  GLUCOSE 79 190*  BUN 38* 30*  CREATININE 1.28* 1.18*  CALCIUM 8.6* 7.9*   PT/INR No results for input(s): LABPROT, INR in the last 72 hours. Comprehensive Metabolic Panel:    Component Value Date/Time   NA 137 01/24/2015 0410   NA 138 01/23/2015 0355   K 4.0 01/24/2015 0410   K 3.3* 01/23/2015 0355   CL 102 01/24/2015 0410   CL 98* 01/23/2015 0355   CO2 24 01/24/2015 0410   CO2 26 01/23/2015 0355   BUN 30* 01/24/2015 0410   BUN 38* 01/23/2015 0355   CREATININE 1.18* 01/24/2015 0410   CREATININE 1.28* 01/23/2015 0355   GLUCOSE 190* 01/24/2015 0410   GLUCOSE 79 01/23/2015 0355   CALCIUM 7.9* 01/24/2015 0410   CALCIUM 8.6* 01/23/2015 0355   AST 25 01/21/2015  0745   AST 27 01/20/2015 2247   ALT 22 01/21/2015 0745   ALT 23 01/20/2015 2247   ALKPHOS 66 01/21/2015 0745   ALKPHOS 74 01/20/2015 2247   BILITOT 1.4* 01/21/2015 0745   BILITOT 1.3* 01/20/2015 2247   PROT 6.9 01/21/2015 0745   PROT 7.9 01/20/2015 2247   ALBUMIN 3.9 01/21/2015 0745   ALBUMIN 4.5 01/20/2015 2247    Studies/Results: Dg Abd Portable 1v  01/23/2015  CLINICAL DATA:  Small bowel obstruction EXAM: PORTABLE ABDOMEN - 1 VIEW COMPARISON:  Abdominal series of January 22, 2015 FINDINGS: This single supine abdominal film reveals several moderately distended air-filled and fluid-filled bowel loops. This is predominantly small bowel. Contrast with things will within these loops is present from the CT scan of 3 days ago. There is a small amount of gas within portions of the colon. There is no definite rectal gas. The nasogastric tube tip lies in the pylorus or proximal duodenum with the proximal port in the pre-pyloric region. No free extraluminal gas is demonstrated. IMPRESSION: Persistent distal small bowel obstruction. There has not been significant progression of contrast into the colon since the abdominal CT scan of 15 November. Electronically Signed   By: David  Martinique M.D.   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 Office: [place] 01/24/2015

## 2015-01-24 NOTE — Progress Notes (Signed)
[facility]                                                                              Patient Demographics  Beth Wilkins, is a 74 y.o. female, DOB - 03-09-1940, [id number]  Admit date - 01/20/2015   Admitting Physician Rise Patience, MD  Outpatient Primary MD for the patient is [name], MD  LOS - 3   Chief Complaint  Patient presents with  . Dehydration      HPI on 01/21/2015 by Dr. Gean Birchwood Beth Wilkins is a 74 y.o. female with history of hypertension, hyperlipidemia, dementia was brought to the ER after patient has been having multiple episodes of nausea vomiting. As per patient's daughter who provided the history patient has been having decreased appetite and abdominal distention for almost 2-3 weeks now. Over the last 2-3 days patient has been having projectile vomiting which has not improved and patient was brought to the ER. CT abdomen and pelvis shows high-grade small bowel obstruction and on-call surgeon Dr. Excell Seltzer was consulted by the ER physician and patient has been admitted for further management. Patient has been placed on NG tube suction following which large amounts of fluid has been suctioned. As per the daughter patient has not complained of any abdominal pain as such. Patient does not recall her last bowel movement. Denies any chest pain or shortness of breath. Has not had any previous abdominal surgery as per the patient's daughter.   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 -CT abd/pelvis: High-grade small bowel obstruction with transition point in the right lower quadrant -Continue NG tube, NPO, IVF, antiemetics, pain control -Started on IV Cefotetan -General surgery consulted and appreciated -Small bowel protocol did not show improvement in obstruction -KUB this morning: Persistent obstruction- no significant progression of contrast -CEA 5.7 -s/p right colectomy   Acute  renal failure -Upon admission, was 2.37  -Likely secondary to dehydration and poor oral intake -Creatinine improving, currently 1.18  Right-sided hydronephrosis -No obstructing lesions noted on CT: Right hydroureteronephrosis, no obstructing lesion or stone -Will continue to monitor creatinine closely  Multiple liver lesions -Noted on CT of the abdomen, patient will need outpatient follow-up and monitoring  Hypertension -Home medications held, continue IV hydralazine PRN  Hyperlipidemia -Home medications held due to NPO  Dementia -Home medications currently held due to NPO -Per daughter at bedside, patient has dementia, but is able to confabulate well  Leukocytosis -Improving, Likely secondary to SBO -continue to monitor CBC -patient is currently afebrile, will not start on antibiotics at this time  Hypokalemia -Resolved, Likely secondary to nausea and vomiting,  -Continue to monitor BMP and replace as needed -Magnesium 2.2  Code Status: Full  Family Communication: Daughter at bedside  Disposition Plan: Admitted.   Time Spent in minutes 30 minutes  Procedures  None  Consults  Gen. surgery  DVT Prophylaxis Heparin  Lab Results  Component Value Date   PLT 251 01/24/2015    Medications  Scheduled Meds: . heparin subcutaneous  5,000 Units Subcutaneous 3 times per day  . metoprolol  5 mg Intravenous 4 times per day   Continuous Infusions: . dextrose 5 %  and 0.45 % NaCl with KCl 20 mEq/L 100 mL/hr at 01/24/15 1049   PRN Meds:.morphine injection, ondansetron **OR** ondansetron (ZOFRAN) IV  Antibiotics    Anti-infectives    Start     Dose/Rate Route Frequency Ordered Stop   01/24/15 0400  cefoTEtan (CEFOTAN) 2 g in dextrose 5 % 50 mL IVPB     2 g 100 mL/hr over 30 Minutes Intravenous Every 12 hours 01/23/15 2116 01/24/15 0455   01/23/15 1615  ceFAZolin (ANCEF) IVPB 2 g/50 mL premix     2 g 100 mL/hr over 30 Minutes Intravenous  Once 01/23/15  1614 01/23/15 1655      Subjective:   [name] seen and examined today.  Patient denies pain today.  Denies nausea or vomiting.  Per night RN, patient had received dilaudid and fentanyl after surgery and was very altered. She is currently back to her baseline. Denies chest pain, shortness of breath.  Objective:   Filed Vitals:   01/23/15 2104 01/24/15 0007 01/24/15 0221 01/24/15 0445  BP: 133/54 112/63 97/56 105/50  Pulse: 115 105 112 111  Temp: 97.4 F (36.3 C)  98.6 F (37 C) 98.5 F (36.9 C)  TempSrc:   Oral Oral  Resp: 20  20 20   Height:      Weight:      SpO2: 100%  100% 100%    Wt Readings from Last 3 Encounters:  01/23/15 47.174 kg (104 lb)  09/02/14 54.432 kg (120 lb)  03/03/14 56.7 kg (125 lb)     Intake/Output Summary (Last 24 hours) at 01/24/15 1111 Last data filed at 01/24/15 0811  Gross per 24 hour  Intake 3844.75 ml  Output   2490 ml  Net 1354.75 ml    Exam  General: Well developed, well nourished, NAD, appears stated age  66: NCAT, mucous membranes moist. NGT in place  Cardiovascular: S1 S2 auscultated, RRR, no murmurs  Respiratory: Clear to auscultation   Abdomen: Soft, nontender, mildly distended, + bowel sounds, midline dressing with VAC in place  Extremities: warm dry without cyanosis clubbing or edema  Neuro: AAOx2 (person and place, not time)  Nonfocal  Data Review   Micro Results No results found for this or any previous visit (from the past 240 hour(s)).  Radiology Reports Ct Abdomen Pelvis Wo Contrast  01/21/2015  CLINICAL DATA:  Abdominal distention for 4 days with nausea and vomiting. Decreased urine output. EXAM: CT ABDOMEN AND PELVIS WITHOUT CONTRAST TECHNIQUE: Multidetector CT imaging of the abdomen and pelvis was performed following the standard protocol without IV contrast. COMPARISON:  None. FINDINGS: Lower chest:  The included lung bases are clear. Liver: Ladder density lesion in the anterior subcapsular left  lobe measures 3.6 cm and is consistent with simple cyst. Additional smaller hypodense lesions in the left and anterior right lobe are incompletely characterized without contrast. Hepatobiliary: Gallbladder physiologically distended, displaced laterally by dilated stomach. No calcified gallstone. Pancreas: Normal.  No ductal dilatation or surrounding inflammation. Spleen: Small soft tissue density in the left upper quadrant, likely diminutive spleen. Adrenal glands: No nodule. Kidneys: Multiple bilateral low-density lesions in both kidneys consistent with cysts. Mild right hydroureteronephrosis, distal ureter is suboptimally defined. There is an nonobstructing stone in the lower right kidney. No ureteral calculi. No left hydronephrosis. Stomach/Bowel: Stomach markedly distended with fluid. Diffuse fluid-filled dilated small bowel, measuring up to 4.1 cm. Transition point suspected in the right lower quadrant, however no definite decompressed small bowel is seen. No pneumatosis. Small volume of  stool throughout the colon without colonic wall thickening. The ileocecal valve not well-defined. The appendix is not definitively seen. Vascular/Lymphatic: No retroperitoneal adenopathy. Abdominal aorta is normal in caliber. Dense atherosclerosis of the abdominal aorta and its branches. Reproductive: Uterus appears in situ, suboptimally defined due to fluid-filled adjacent bowel loops. Limited assessment for adnexal mass. Bladder: Minimally distended, irregular/tubular in shape. No wall thickening. Displaced posteriorly due to dilated small bowel. Other: Mild mesenteric edema centrally.  No free air or free fluid. Musculoskeletal: There are no acute or suspicious osseous abnormalities. Advanced facet arthropathy in the lumbar spine. Transitional lumbosacral anatomy. IMPRESSION: 1. High-grade small bowel obstruction with transition point in the right lower quadrant. No definite decompressed small bowel loops are seen, suspect  distal ileum a site of obstruction. No perforation, pneumatosis or free fluid. 2. Right hydroureteronephrosis, however no obstructing lesion or stone. There is a nonobstructing stone in the lower right kidney. Multiple bilateral renal cysts. 3. Multiple liver lesions, largest represents a simple cyst. Smaller lesions are incompletely characterized without contrast. Electronically Signed   By: Jeb Levering M.D.   On: 01/21/2015 03:26   Dg Abd Portable 1v  01/23/2015  CLINICAL DATA:  Small bowel obstruction EXAM: PORTABLE ABDOMEN - 1 VIEW COMPARISON:  Abdominal series of January 22, 2015 FINDINGS: This single supine abdominal film reveals several moderately distended air-filled and fluid-filled bowel loops. This is predominantly small bowel. Contrast with things will within these loops is present from the CT scan of 3 days ago. There is a small amount of gas within portions of the colon. There is no definite rectal gas. The nasogastric tube tip lies in the pylorus or proximal duodenum with the proximal port in the pre-pyloric region. No free extraluminal gas is demonstrated. IMPRESSION: Persistent distal small bowel obstruction. There has not been significant progression of contrast into the colon since the abdominal CT scan of 15 November. Electronically Signed   By: David  Martinique M.D.   On: 01/23/2015 07:09   Dg Abd Portable 1v  01/22/2015  CLINICAL DATA:  Nausea, vomiting, abdominal distension, small bowel obstruction, hypertension, dementia, GERD EXAM: PORTABLE ABDOMEN - 1 VIEW COMPARISON:  01/21/2015 FINDINGS: Contrast opacifies multiple dilated small bowel loops. Persistent small bowel dilatation unchanged previous exam. Nasogastric tube coiled in stomach with tip projecting over pylorus or proximal duodenum. No definite bowel wall thickening. No significant colonic gas or stool. Bones demineralized. IMPRESSION: Persistent small bowel dilatation compatible with obstruction. Electronically Signed    By: Lavonia Dana M.D.   On: 01/22/2015 07:51   Dg Abd Portable 1v-small Bowel Obstruction Protocol-initial, 8 Hr Delay  01/21/2015  CLINICAL DATA:  8 hour delay film EXAM: PORTABLE ABDOMEN - 1 VIEW COMPARISON:  Earlier film of the same day FINDINGS: Nasogastric tube tip probably just beyond the pylorus. Multiple dilated small bowel loops as before. Some enteral contrast is now evident, predominantly in dilated small bowel loops. The colon appears decompressed. Degenerative changes in the lower lumbar spine. IMPRESSION: 1. Stable nasogastric tube into the proximal duodenum, with little change in appearance of small bowel obstruction. Electronically Signed   By: Lucrezia Europe M.D.   On: 01/21/2015 17:39   Dg Abd Portable 1v-small Bowel Protocol-position Verification  01/21/2015  CLINICAL DATA:  Check nasogastric catheter placement EXAM: PORTABLE ABDOMEN - 1 VIEW COMPARISON:  01/20/2015 FINDINGS: A nasogastric catheter is noted coiled within the stomach. The tip is directed towards the pyloric channel. The stomach has been decompressed when compared with prior exam. The small  bowel remains dilated similar to that seen on recent CT examination. Degenerative changes of lumbar spine are seen. IMPRESSION: Stable small bowel dilatation. Nasogastric catheter within the stomach. Electronically Signed   By: Inez Catalina M.D.   On: 01/21/2015 08:38    CBC  Recent Labs Lab 01/20/15 2247 01/21/15 0745 01/22/15 0350 01/23/15 0355 01/24/15 0410  WBC 12.9* 13.6* 11.8* 10.9* 10.5  HGB 13.5 12.0 11.9* 11.5* 11.3*  HCT 38.8 34.7* 35.2* 33.5* 33.0*  PLT 319 298 291 267 251  MCV 83.8 83.4 86.1 87.0 86.6  MCH 29.2 28.8 29.1 29.9 29.7  MCHC 34.8 34.6 33.8 34.3 34.2  RDW 14.5 14.5 14.9 15.1 15.2  LYMPHSABS 2.3 1.5  --   --   --   MONOABS 0.8 0.6  --   --   --   EOSABS 0.0 0.0  --   --   --   BASOSABS 0.0 0.0  --   --   --     Chemistries   Recent Labs Lab 01/20/15 2247 01/21/15 0745 01/22/15 0350  01/23/15 0355 01/24/15 0410  NA 129* 130* 136 138 137  K 3.7 3.1* 3.2* 3.3* 4.0  CL 82* 88* 97* 98* 102  CO2 30 25 25 26 24   GLUCOSE 122* 97 85 79 190*  BUN 74* 67* 52* 38* 30*  CREATININE 2.37* 1.91* 1.56* 1.28* 1.18*  CALCIUM 9.2 8.4* 8.4* 8.6* 7.9*  MG  --   --  2.2  --   --   AST 27 25  --   --   --   ALT 23 22  --   --   --   ALKPHOS 74 66  --   --   --   BILITOT 1.3* 1.4*  --   --   --    ------------------------------------------------------------------------------------------------------------------ estimated creatinine clearance is 32.1 mL/min (by C-G formula based on Cr of 1.18). ------------------------------------------------------------------------------------------------------------------ No results for input(s): HGBA1C in the last 72 hours. ------------------------------------------------------------------------------------------------------------------ No results for input(s): CHOL, HDL, LDLCALC, TRIG, CHOLHDL, LDLDIRECT in the last 72 hours. ------------------------------------------------------------------------------------------------------------------ No results for input(s): TSH, T4TOTAL, T3FREE, THYROIDAB in the last 72 hours.  Invalid input(s): FREET3 ------------------------------------------------------------------------------------------------------------------ No results for input(s): VITAMINB12, FOLATE, FERRITIN, TIBC, IRON, RETICCTPCT in the last 72 hours.  Coagulation profile No results for input(s): INR, PROTIME in the last 168 hours.  No results for input(s): DDIMER in the last 72 hours.  Cardiac Enzymes No results for input(s): CKMB, TROPONINI, MYOGLOBIN in the last 168 hours.  Invalid input(s): CK ------------------------------------------------------------------------------------------------------------------ Invalid input(s): POCBNP    Aleksey Newbern D.O. on 01/24/2015 at 11:11 AM  Between 7am to 7pm - Pager - [phone]  After  7pm go to [email or web address] - password [email or web address]  And look for the night coverage person covering for me after hours  [facility] Office  [phone]

## 2015-01-24 NOTE — Op Note (Signed)
NAMEMarland Kitchen  [place], [place] NO.:  [id number]  MEDICAL RECORD NO.:  [id number]  LOCATION:  51                         FACILITY:  [facility]  PHYSICIAN:  Isabel Caprice. Hassell Done, MD  DATE OF BIRTH:  Aug 03, 1940  DATE OF PROCEDURE:  01/23/2015 DATE OF DISCHARGE:                              OPERATIVE REPORT   PREOPERATIVE DIAGNOSIS:  Distal small bowel obstruction.  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  PROCEDURE:  Right hemicolectomy (limited with ileo-ascending colostomy hand-sewn anastomosis).  SURGEON:  Isabel Caprice. Hassell Done, MD.  ASSISTANT:  None.  ANESTHESIA:  General endotracheal.  INDICATION OF PROCEDURE:  Beth Wilkins is a 74 year old lady who has had about a week-long history of progressive nausea and vomiting.  She stays in the care of her eldest daughter and she has been admitted and observed.  CT scan showed distal obstruction with some mild right hydronephrosis.  DESCRIPTION OF PROCEDURE:  Procedure began by taking her to room 2 of [facility] on Friday and given a general anesthesia.  The patient had an NG tube placed.  She was put to sleep.  The abdomen was prepped with chlorhexidine and draped sterilely.  I entered the abdomen through the left upper quadrant with a 5-mm Optiview after a time-out was performed. The bowel loops were extremely dilated.  I put in two other 5 mm in the midline and tried to mobilize the right colon.  I could see that the right colon was decompressed.  I elected then to make a small midline incision which expanded to a larger midline incision and eviscerated her markedly dilated small bowel loops.  I milked some of the contents back up in the NG and got in immediate 700 mL.  I then went distally and found the terminal ileum was pound down to the right tubo-ovarian structures.  There were a lot of cicatrix down in that area and it was right over  where the ureter would cross the iliacs.  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  I mobilized in the cecum and could not see an appendix but just saw a scar there at the base.  However, it pretty much involved the terminal ileum as well and this is probably what was producing her kinking.  I went ahead and divided the terminal ileum with a Tri-Stapler Covidien purple load and then divided the ascending colon with the Tri-Stapler Covidien purple load.  I resected the mesentery with the bipolar Covidien device.  The specimen was then removed to the back table where I opened it up and found that the terminal ileum and ileocecal valve were open and this really inflamed mass was down at the base of the appendix.  I then created a functional end-to-end anastomosis aligning the colon and the antimesenteric border of the small bowel.  I opened it, decompressed it, and then I amputated little more of the terminal ileum so that it would be a better blood supply.  A staples anastomosis was done with a purple Tri-Stapler from Covidien 6 cm.  The common defect was closed in 2  layers with 4-0 PDS running canal fashion and interrupted 3-0 silk.  The mesenteric defect was closed with interrupted 2-0 silk.  I irrigated with copious amounts of saline.  I did use a little Betadine and wiped out the anastomotic region since this was unprepped bowel.  Following this, we followed the colon protocol, changed our gown and gloves and equipment.  I irrigated with more saline.  I closed the peritoneum with running 2-0 Vicryl.  I closed the fascia with interrupted #1 Novafil.  I irrigated again, closed her skin with staples and I applied the Prevena wound incisional suction device. The patient tolerated the procedure well, and was taken to the recovery room in satisfactory condition.     Isabel Caprice Hassell Done, MD     MBM/MEDQ  D:  01/23/2015  T:  01/24/2015  Job:  [id number]

## 2015-01-24 NOTE — Anesthesia Postprocedure Evaluation (Signed)
  Anesthesia Post-op Note  Patient: Beth Wilkins  Procedure(s) Performed: Procedure(s) (LRB): DIAGNOSTIC LAPAROSCOPY, EXPLORATORY LAPAROTOMY  RIGHT HEMI-COLECTOMY FOR OBSTRUCTION OF RIGHT TERMINAL ILEUM (N/A)  Patient Location: PACU  Anesthesia Type: General  Level of Consciousness: awake and alert   Airway and Oxygen Therapy: Patient Spontanous Breathing  Post-op Pain: mild  Post-op Assessment: Post-op Vital signs reviewed, Patient's Cardiovascular Status Stable, Respiratory Function Stable, Patent Airway and No signs of Nausea or vomiting  Last Vitals:  Filed Vitals:   01/23/15 2104  BP: 133/54  Pulse: 115  Temp: 36.3 C  Resp: 20    Post-op Vital Signs: stable   Complications: No apparent anesthesia complications

## 2015-01-25 DIAGNOSIS — K3532 Acute appendicitis with perforation and localized peritonitis, without abscess: Secondary | ICD-10-CM | POA: Insufficient documentation

## 2015-01-25 DIAGNOSIS — E876 Hypokalemia: Secondary | ICD-10-CM

## 2015-01-25 DIAGNOSIS — D72829 Elevated white blood cell count, unspecified: Secondary | ICD-10-CM

## 2015-01-25 LAB — BASIC METABOLIC PANEL
ANION GAP: 7 (ref 5–15)
BUN: 16 mg/dL (ref 6–20)
CALCIUM: 7.8 mg/dL — AB (ref 8.9–10.3)
CO2: 24 mmol/L (ref 22–32)
Chloride: 107 mmol/L (ref 101–111)
Creatinine, Ser: 0.97 mg/dL (ref 0.44–1.00)
GFR calc Af Amer: >60 mL/min (ref >60)
GFR calc non Af Amer: 57 mL/min — ABNORMAL LOW (ref >60)
GLUCOSE: 136 mg/dL — AB (ref 65–99)
Potassium: 3.8 mmol/L (ref 3.5–5.1)
Sodium: 138 mmol/L (ref 135–145)

## 2015-01-25 LAB — GLUCOSE, CAPILLARY
GLUCOSE-CAPILLARY: 113 mg/dL — AB (ref 65–99)
GLUCOSE-CAPILLARY: 130 mg/dL — AB (ref 65–99)
GLUCOSE-CAPILLARY: 131 mg/dL — AB (ref 65–99)
GLUCOSE-CAPILLARY: 99 mg/dL (ref 65–99)
Glucose-Capillary: 121 mg/dL — ABNORMAL HIGH (ref 65–99)
Glucose-Capillary: 128 mg/dL — ABNORMAL HIGH (ref 65–99)

## 2015-01-25 LAB — CBC
HCT: 30.1 % — ABNORMAL LOW (ref 36.0–46.0)
HEMOGLOBIN: 10.2 g/dL — AB (ref 12.0–15.0)
MCH: 29.4 pg (ref 26.0–34.0)
MCHC: 33.9 g/dL (ref 30.0–36.0)
MCV: 86.7 fL (ref 78.0–100.0)
Platelets: 225 10*3/uL (ref 150–400)
RBC: 3.47 MIL/uL — ABNORMAL LOW (ref 3.87–5.11)
RDW: 15.3 % (ref 11.5–15.5)
WBC: 10 10*3/uL (ref 4.0–10.5)

## 2015-01-25 NOTE — Progress Notes (Signed)
Patient ID: Beth Wilkins, female   DOB: Jan 30, 1941, 74 y.o.   MRN: [id number]  [facility]  POD#: 2  Subjective: Patient in bed with headache this AM.  Son at bedside.  Denies flatus or BM.  Objective: Vital signs in last 24 hours: Temp:  [97.3 F (36.3 C)-99.6 F (37.6 C)] 97.3 F (36.3 C) (11/20 0414) Pulse Rate:  [98-103] 103 (11/20 0414) Resp:  [18-20] 18 (11/20 0414) BP: (107-109)/(53-61) 109/61 mmHg (11/20 0414) SpO2:  [100 %] 100 % (11/20 0414) Last BM Date: 01/22/15  Intake/Output from previous day: 11/19 0701 - 11/20 0700 In: 1510 [I.V.:1310; NG/GT:200] Out: 1550 [Urine:800; Emesis/NG output:750] Intake/Output this shift:    Physical Exam: HEENT - sclerae clear, mucous membranes moist Neck - soft Chest - clear bilaterally Cor - RRR Abdomen - soft, mild distension; BS present; VAC dressing intact Ext - no edema, non-tender  Lab Results:   Recent Labs  01/24/15 0410 01/25/15 0436  WBC 10.5 10.0  HGB 11.3* 10.2*  HCT 33.0* 30.1*  PLT 251 225   BMET  Recent Labs  01/24/15 0410 01/25/15 0436  NA 137 138  K 4.0 3.8  CL 102 107  CO2 24 24  GLUCOSE 190* 136*  BUN 30* 16  CREATININE 1.18* 0.97  CALCIUM 7.9* 7.8*   PT/INR No results for input(s): LABPROT, INR in the last 72 hours. Comprehensive Metabolic Panel:    Component Value Date/Time   NA 138 01/25/2015 0436   NA 137 01/24/2015 0410   K 3.8 01/25/2015 0436   K 4.0 01/24/2015 0410   CL 107 01/25/2015 0436   CL 102 01/24/2015 0410   CO2 24 01/25/2015 0436   CO2 24 01/24/2015 0410   BUN 16 01/25/2015 0436   BUN 30* 01/24/2015 0410   CREATININE 0.97 01/25/2015 0436   CREATININE 1.18* 01/24/2015 0410   GLUCOSE 136* 01/25/2015 0436   GLUCOSE 190* 01/24/2015 0410   CALCIUM 7.8* 01/25/2015 0436   CALCIUM 7.9* 01/24/2015 0410   AST 25 01/21/2015 0745   AST 27 01/20/2015 2247   ALT 22 01/21/2015 0745   ALT 23 01/20/2015 2247   ALKPHOS 66  01/21/2015 0745   ALKPHOS 74 01/20/2015 2247   BILITOT 1.4* 01/21/2015 0745   BILITOT 1.3* 01/20/2015 2247   PROT 6.9 01/21/2015 0745   PROT 7.9 01/20/2015 2247   ALBUMIN 3.9 01/21/2015 0745   ALBUMIN 4.5 01/20/2015 2247    Studies/Results: No results found.  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 Office: [place] 01/25/2015

## 2015-01-25 NOTE — Progress Notes (Signed)
[facility]                                                                              Patient Demographics  Beth Wilkins, is a 74 y.o. female, DOB - [birth year], [id number]  Admit date - 01/20/2015   Admitting Physician Rise Patience, MD  Outpatient Primary MD for the patient is [name], MD  LOS - 4   Chief Complaint  Patient presents with  . Dehydration      HPI on 01/21/2015 by Dr. Gean Birchwood Beth Wilkins is a 74 y.o. female with history of hypertension, hyperlipidemia, dementia was brought to the ER after patient has been having multiple episodes of nausea vomiting. As per patient's daughter who provided the history patient has been having decreased appetite and abdominal distention for almost 2-3 weeks now. Over the last 2-3 days patient has been having projectile vomiting which has not improved and patient was brought to the ER. CT abdomen and pelvis shows high-grade small bowel obstruction and on-call surgeon Dr. Excell Seltzer was consulted by the ER physician and patient has been admitted for further management. Patient has been placed on NG tube suction following which large amounts of fluid has been suctioned. As per the daughter patient has not complained of any abdominal pain as such. Patient does not recall her last bowel movement. Denies any chest pain or shortness of breath. Has not had any previous abdominal surgery as per the patient's daughter.   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 Upon admission, was 2.37  -Likely secondary to dehydration and poor oral intake -Creatinine improving, currently 0.97  Right-sided hydronephrosis -No obstructing lesions noted on CT: Right hydroureteronephrosis, no obstructing lesion or stone -Will continue to monitor creatinine closely  Multiple liver lesions -Noted on CT of the abdomen, patient will need outpatient follow-up and monitoring  Hypertension -Home medications held, continue IV hydralazine PRN  Hyperlipidemia -Home medications held due to NPO  Dementia -Home medications currently held due to NPO -Per daughter at bedside, patient has dementia, but is able to confabulate well  Leukocytosis -Resolved, Likely secondary to SBO -continue to monitor CBC -patient is currently afebrile  Hypokalemia -Resolved, Likely secondary to nausea and vomiting,  -Continue to monitor BMP and replace as needed -Magnesium 2.2  Headache -Continue pain control  Code Status: Full  Family Communication: Son at bedside  Disposition Plan: Admitted. Continues to have NGT.  Time Spent in minutes 30 minutes  Procedures  Right colectomy  Consults  General surgery  DVT Prophylaxis Heparin  Lab Results  Component Value Date   PLT 225 01/25/2015    Medications  Scheduled Meds: . heparin subcutaneous  5,000 Units Subcutaneous 3 times per day  . metoprolol  5 mg Intravenous 4 times per day   Continuous  Infusions: . dextrose 5 % and 0.45 % NaCl with KCl 20 mEq/L 100 mL/hr at 01/25/15 0653   PRN Meds:.morphine injection, ondansetron **OR** ondansetron (ZOFRAN) IV  Antibiotics    Anti-infectives    Start     Dose/Rate Route Frequency Ordered Stop   01/24/15 0400  cefoTEtan (CEFOTAN) 2 g in dextrose 5 % 50 mL IVPB     2 g 100 mL/hr over 30 Minutes Intravenous Every 12 hours 01/23/15 2116 01/24/15 0455   01/23/15 1615  ceFAZolin (ANCEF) IVPB 2 g/50 mL premix     2 g 100  mL/hr over 30 Minutes Intravenous  Once 01/23/15 1614 01/23/15 1655      Subjective:   Beth Wilkins seen and examined today. Patient complains of headache today. Denies any pruritus or bowel movements. Denies any nausea or vomiting at this time. Denies any chest pain or shortness of breath.  Objective:   Filed Vitals:   01/24/15 0445 01/24/15 1330 01/24/15 2041 01/25/15 0414  BP: 105/50 109/53 107/55 109/61  Pulse: 111 98 103 103  Temp: 98.5 F (36.9 C) 98.9 F (37.2 C) 99.6 F (37.6 C) 97.3 F (36.3 C)  TempSrc: Oral Oral Oral Oral  Resp: 20 20 18 18   Height:      Weight:      SpO2: 100% 100% 100% 100%    Wt Readings from Last 3 Encounters:  01/23/15 47.174 kg (104 lb)  09/02/14 54.432 kg (120 lb)  03/03/14 56.7 kg (125 lb)     Intake/Output Summary (Last 24 hours) at 01/25/15 0947 Last data filed at 01/25/15 [id number]  Gross per 24 hour  Intake    700 ml  Output    900 ml  Net   -200 ml    Exam  General: Well developed, well nourished, NAD, appears stated age  49: NCAT, mucous membranes moist. NGT in place  Cardiovascular: S1 S2 auscultated, RRR, no murmurs  Respiratory: Clear to auscultation   Abdomen: Soft, nontender, mildly distended, + bowel sounds, midline dressing with VAC in place  Extremities: warm dry without cyanosis clubbing or edema  Neuro: AAOx2 (person and place, not time)  Nonfocal  Data Review   Micro Results No results found for this or any previous visit (from the past 240 hour(s)).  Radiology Reports Ct Abdomen Pelvis Wo Contrast  01/21/2015  CLINICAL DATA:  Abdominal distention for 4 days with nausea and vomiting. Decreased urine output. EXAM: CT ABDOMEN AND PELVIS WITHOUT CONTRAST TECHNIQUE: Multidetector CT imaging of the abdomen and pelvis was performed following the standard protocol without IV contrast. COMPARISON:  None. FINDINGS: Lower chest:  The included lung bases are clear. Liver: Ladder density lesion in the anterior  subcapsular left lobe measures 3.6 cm and is consistent with simple cyst. Additional smaller hypodense lesions in the left and anterior right lobe are incompletely characterized without contrast. Hepatobiliary: Gallbladder physiologically distended, displaced laterally by dilated stomach. No calcified gallstone. Pancreas: Normal.  No ductal dilatation or surrounding inflammation. Spleen: Small soft tissue density in the left upper quadrant, likely diminutive spleen. Adrenal glands: No nodule. Kidneys: Multiple bilateral low-density lesions in both kidneys consistent with cysts. Mild right hydroureteronephrosis, distal ureter is suboptimally defined. There is an nonobstructing stone in the lower right kidney. No ureteral calculi. No left hydronephrosis. Stomach/Bowel: Stomach markedly distended with fluid. Diffuse fluid-filled dilated small bowel, measuring up to 4.1 cm. Transition point suspected in the right lower quadrant, however no definite decompressed small bowel is seen. No pneumatosis. Small  volume of stool throughout the colon without colonic wall thickening. The ileocecal valve not well-defined. The appendix is not definitively seen. Vascular/Lymphatic: No retroperitoneal adenopathy. Abdominal aorta is normal in caliber. Dense atherosclerosis of the abdominal aorta and its branches. Reproductive: Uterus appears in situ, suboptimally defined due to fluid-filled adjacent bowel loops. Limited assessment for adnexal mass. Bladder: Minimally distended, irregular/tubular in shape. No wall thickening. Displaced posteriorly due to dilated small bowel. Other: Mild mesenteric edema centrally.  No free air or free fluid. Musculoskeletal: There are no acute or suspicious osseous abnormalities. Advanced facet arthropathy in the lumbar spine. Transitional lumbosacral anatomy. IMPRESSION: 1. High-grade small bowel obstruction with transition point in the right lower quadrant. No definite decompressed small bowel loops  are seen, suspect distal ileum a site of obstruction. No perforation, pneumatosis or free fluid. 2. Right hydroureteronephrosis, however no obstructing lesion or stone. There is a nonobstructing stone in the lower right kidney. Multiple bilateral renal cysts. 3. Multiple liver lesions, largest represents a simple cyst. Smaller lesions are incompletely characterized without contrast. Electronically Signed   By: Jeb Levering M.D.   On: 01/21/2015 03:26   Dg Abd Portable 1v  01/23/2015  CLINICAL DATA:  Small bowel obstruction EXAM: PORTABLE ABDOMEN - 1 VIEW COMPARISON:  Abdominal series of January 22, 2015 FINDINGS: This single supine abdominal film reveals several moderately distended air-filled and fluid-filled bowel loops. This is predominantly small bowel. Contrast with things will within these loops is present from the CT scan of 3 days ago. There is a small amount of gas within portions of the colon. There is no definite rectal gas. The nasogastric tube tip lies in the pylorus or proximal duodenum with the proximal port in the pre-pyloric region. No free extraluminal gas is demonstrated. IMPRESSION: Persistent distal small bowel obstruction. There has not been significant progression of contrast into the colon since the abdominal CT scan of 15 November. Electronically Signed   By: David  Martinique M.D.   On: 01/23/2015 07:09   Dg Abd Portable 1v  01/22/2015  CLINICAL DATA:  Nausea, vomiting, abdominal distension, small bowel obstruction, hypertension, dementia, GERD EXAM: PORTABLE ABDOMEN - 1 VIEW COMPARISON:  01/21/2015 FINDINGS: Contrast opacifies multiple dilated small bowel loops. Persistent small bowel dilatation unchanged previous exam. Nasogastric tube coiled in stomach with tip projecting over pylorus or proximal duodenum. No definite bowel wall thickening. No significant colonic gas or stool. Bones demineralized. IMPRESSION: Persistent small bowel dilatation compatible with obstruction.  Electronically Signed   By: Lavonia Dana M.D.   On: 01/22/2015 07:51   Dg Abd Portable 1v-small Bowel Obstruction Protocol-initial, 8 Hr Delay  01/21/2015  CLINICAL DATA:  8 hour delay film EXAM: PORTABLE ABDOMEN - 1 VIEW COMPARISON:  Earlier film of the same day FINDINGS: Nasogastric tube tip probably just beyond the pylorus. Multiple dilated small bowel loops as before. Some enteral contrast is now evident, predominantly in dilated small bowel loops. The colon appears decompressed. Degenerative changes in the lower lumbar spine. IMPRESSION: 1. Stable nasogastric tube into the proximal duodenum, with little change in appearance of small bowel obstruction. Electronically Signed   By: Lucrezia Europe M.D.   On: 01/21/2015 17:39   Dg Abd Portable 1v-small Bowel Protocol-position Verification  01/21/2015  CLINICAL DATA:  Check nasogastric catheter placement EXAM: PORTABLE ABDOMEN - 1 VIEW COMPARISON:  01/20/2015 FINDINGS: A nasogastric catheter is noted coiled within the stomach. The tip is directed towards the pyloric channel. The stomach has been decompressed when compared with prior exam.  The small bowel remains dilated similar to that seen on recent CT examination. Degenerative changes of lumbar spine are seen. IMPRESSION: Stable small bowel dilatation. Nasogastric catheter within the stomach. Electronically Signed   By: Inez Catalina M.D.   On: 01/21/2015 08:38    CBC  Recent Labs Lab 01/20/15 2247 01/21/15 0745 01/22/15 0350 01/23/15 0355 01/24/15 0410 01/25/15 0436  WBC 12.9* 13.6* 11.8* 10.9* 10.5 10.0  HGB 13.5 12.0 11.9* 11.5* 11.3* 10.2*  HCT 38.8 34.7* 35.2* 33.5* 33.0* 30.1*  PLT 319 298 291 267 251 225  MCV 83.8 83.4 86.1 87.0 86.6 86.7  MCH 29.2 28.8 29.1 29.9 29.7 29.4  MCHC 34.8 34.6 33.8 34.3 34.2 33.9  RDW 14.5 14.5 14.9 15.1 15.2 15.3  LYMPHSABS 2.3 1.5  --   --   --   --   MONOABS 0.8 0.6  --   --   --   --   EOSABS 0.0 0.0  --   --   --   --   BASOSABS 0.0 0.0  --   --    --   --     Chemistries   Recent Labs Lab 01/20/15 2247 01/21/15 0745 01/22/15 0350 01/23/15 0355 01/24/15 0410 01/25/15 0436  NA 129* 130* 136 138 137 138  K 3.7 3.1* 3.2* 3.3* 4.0 3.8  CL 82* 88* 97* 98* 102 107  CO2 30 25 25 26 24 24   GLUCOSE 122* 97 85 79 190* 136*  BUN 74* 67* 52* 38* 30* 16  CREATININE 2.37* 1.91* 1.56* 1.28* 1.18* 0.97  CALCIUM 9.2 8.4* 8.4* 8.6* 7.9* 7.8*  MG  --   --  2.2  --   --   --   AST 27 25  --   --   --   --   ALT 23 22  --   --   --   --   ALKPHOS 74 66  --   --   --   --   BILITOT 1.3* 1.4*  --   --   --   --    ------------------------------------------------------------------------------------------------------------------ estimated creatinine clearance is 39.1 mL/min (by C-G formula based on Cr of 0.97). ------------------------------------------------------------------------------------------------------------------ No results for input(s): HGBA1C in the last 72 hours. ------------------------------------------------------------------------------------------------------------------ No results for input(s): CHOL, HDL, LDLCALC, TRIG, CHOLHDL, LDLDIRECT in the last 72 hours. ------------------------------------------------------------------------------------------------------------------ No results for input(s): TSH, T4TOTAL, T3FREE, THYROIDAB in the last 72 hours.  Invalid input(s): FREET3 ------------------------------------------------------------------------------------------------------------------ No results for input(s): VITAMINB12, FOLATE, FERRITIN, TIBC, IRON, RETICCTPCT in the last 72 hours.  Coagulation profile No results for input(s): INR, PROTIME in the last 168 hours.  No results for input(s): DDIMER in the last 72 hours.  Cardiac Enzymes No results for input(s): CKMB, TROPONINI, MYOGLOBIN in the last 168 hours.  Invalid input(s):  CK ------------------------------------------------------------------------------------------------------------------ Invalid input(s): POCBNP    Jo-Ann Johanning D.O. on 01/25/2015 at 9:47 AM  Between 7am to 7pm - Pager - [phone]  After 7pm go to [email or web address] - password [email or web address]  And look for the night coverage person covering for me after hours  [facility] Office  [phone]

## 2015-01-26 ENCOUNTER — Encounter (HOSPITAL_COMMUNITY): Payer: Self-pay | Admitting: Surgery

## 2015-01-26 ENCOUNTER — Inpatient Hospital Stay (HOSPITAL_COMMUNITY): Payer: Medicare Other

## 2015-01-26 DIAGNOSIS — K5660 Unspecified intestinal obstruction: Secondary | ICD-10-CM

## 2015-01-26 DIAGNOSIS — D649 Anemia, unspecified: Secondary | ICD-10-CM

## 2015-01-26 DIAGNOSIS — D72829 Elevated white blood cell count, unspecified: Secondary | ICD-10-CM

## 2015-01-26 LAB — IRON AND TIBC
IRON: 19 ug/dL — AB (ref 28–170)
Saturation Ratios: 14 % (ref 10.4–31.8)
TIBC: 140 ug/dL — AB (ref 250–450)
UIBC: 121 ug/dL

## 2015-01-26 LAB — RETICULOCYTES
RBC.: 3.47 MIL/uL — ABNORMAL LOW (ref 3.87–5.11)
RETIC COUNT ABSOLUTE: 38.2 10*3/uL (ref 19.0–186.0)
Retic Ct Pct: 1.1 % (ref 0.4–3.1)

## 2015-01-26 LAB — BASIC METABOLIC PANEL
ANION GAP: 6 (ref 5–15)
BUN: 9 mg/dL (ref 6–20)
CALCIUM: 7.7 mg/dL — AB (ref 8.9–10.3)
CO2: 24 mmol/L (ref 22–32)
Chloride: 107 mmol/L (ref 101–111)
Creatinine, Ser: 0.83 mg/dL (ref 0.44–1.00)
GLUCOSE: 110 mg/dL — AB (ref 65–99)
POTASSIUM: 3.7 mmol/L (ref 3.5–5.1)
SODIUM: 137 mmol/L (ref 135–145)

## 2015-01-26 LAB — CBC
HCT: 30.1 % — ABNORMAL LOW (ref 36.0–46.0)
Hemoglobin: 10.3 g/dL — ABNORMAL LOW (ref 12.0–15.0)
MCH: 29.7 pg (ref 26.0–34.0)
MCHC: 34.2 g/dL (ref 30.0–36.0)
MCV: 86.7 fL (ref 78.0–100.0)
PLATELETS: 228 10*3/uL (ref 150–400)
RBC: 3.47 MIL/uL — AB (ref 3.87–5.11)
RDW: 15.2 % (ref 11.5–15.5)
WBC: 8.9 10*3/uL (ref 4.0–10.5)

## 2015-01-26 LAB — VITAMIN B12: VITAMIN B 12: 2280 pg/mL — AB (ref 180–914)

## 2015-01-26 LAB — GLUCOSE, CAPILLARY: GLUCOSE-CAPILLARY: 105 mg/dL — AB (ref 65–99)

## 2015-01-26 LAB — FOLATE: FOLATE: 11.6 ng/mL (ref >5.9)

## 2015-01-26 LAB — MAGNESIUM: Magnesium: 1.3 mg/dL — ABNORMAL LOW (ref 1.7–2.4)

## 2015-01-26 LAB — FERRITIN: Ferritin: 222 ng/mL (ref 11–307)

## 2015-01-26 MED ORDER — ONDANSETRON 4 MG PO TBDP: 4.0000 mg | ORAL_TABLET | Freq: Four times a day (QID) | ORAL | Status: DC | PRN

## 2015-01-26 MED ORDER — PROMETHAZINE HCL 25 MG/ML IJ SOLN: 6.2500 mg | INTRAMUSCULAR | Status: DC | PRN

## 2015-01-26 MED ORDER — MAGNESIUM SULFATE 2 GM/50ML IV SOLN
2.0000 g | Freq: Once | INTRAVENOUS | Status: AC
  Administered 2015-01-26: 2 g via INTRAVENOUS
  Filled 2015-01-26: qty 50

## 2015-01-26 MED ORDER — MORPHINE SULFATE (PF) 2 MG/ML IV SOLN: 1.0000 mg | INTRAVENOUS | Status: DC | PRN

## 2015-01-26 MED ORDER — DIPHENHYDRAMINE HCL 50 MG/ML IJ SOLN: 12.5000 mg | Freq: Four times a day (QID) | INTRAMUSCULAR | Status: DC | PRN

## 2015-01-26 MED ORDER — LACTATED RINGERS IV BOLUS (SEPSIS): 1000.0000 mL | Freq: Three times a day (TID) | INTRAVENOUS | Status: AC | PRN

## 2015-01-26 MED ORDER — MENTHOL 3 MG MT LOZG: 1.0000 | LOZENGE | OROMUCOSAL | Status: DC | PRN

## 2015-01-26 MED ORDER — MAGIC MOUTHWASH
15.0000 mL | Freq: Four times a day (QID) | ORAL | Status: DC | PRN
  Filled 2015-01-26: qty 15

## 2015-01-26 MED ORDER — ALUM & MAG HYDROXIDE-SIMETH 200-200-20 MG/5ML PO SUSP: 30.0000 mL | Freq: Four times a day (QID) | ORAL | Status: DC | PRN

## 2015-01-26 MED ORDER — PHENOL 1.4 % MT LIQD: 2.0000 | OROMUCOSAL | Status: DC | PRN

## 2015-01-26 MED ORDER — IOHEXOL 300 MG/ML  SOLN
75.0000 mL | Freq: Once | INTRAMUSCULAR | Status: AC | PRN
  Administered 2015-01-26: 75 mL via INTRAVENOUS

## 2015-01-26 MED ORDER — ACETAMINOPHEN 650 MG RE SUPP: 650.0000 mg | Freq: Four times a day (QID) | RECTAL | Status: DC | PRN

## 2015-01-26 MED ORDER — LIP MEDEX EX OINT
1.0000 "application " | TOPICAL_OINTMENT | Freq: Two times a day (BID) | CUTANEOUS | Status: DC
  Administered 2015-01-26 – 2015-01-30 (×8): 1 via TOPICAL

## 2015-01-26 MED ORDER — BISACODYL 10 MG RE SUPP
10.0000 mg | Freq: Every day | RECTAL | Status: DC
  Administered 2015-01-30: 10 mg via RECTAL
  Filled 2015-01-26: qty 1

## 2015-01-26 MED ORDER — HYDROCERIN EX CREA
TOPICAL_CREAM | Freq: Two times a day (BID) | CUTANEOUS | Status: DC
  Administered 2015-01-26: 1 via TOPICAL
  Administered 2015-01-26: 15:00:00 via TOPICAL
  Administered 2015-01-27: 1 via TOPICAL
  Administered 2015-01-28 – 2015-01-30 (×5): via TOPICAL
  Filled 2015-01-26: qty 113

## 2015-01-26 NOTE — Progress Notes (Addendum)
[facility]                                                                              Patient Demographics  Beth Wilkins, is a 74 y.o. female, DOB - Feb 21, 1941, [id number]  Admit date - 01/20/2015   Admitting Physician Rise Patience, MD  Outpatient Primary MD for the patient is [name], MD  LOS - 5   Chief Complaint  Patient presents with  . Dehydration      HPI on 01/21/2015 by Dr. Gean Birchwood [name] is a 74 y.o. female with history of hypertension, hyperlipidemia, dementia was brought to the ER after patient has been having multiple episodes of nausea vomiting. As per patient's daughter who provided the history patient has been having decreased appetite and abdominal distention for almost 2-3 weeks now. Over the last 2-3 days patient has been having projectile vomiting which has not improved and patient was brought to the ER. CT abdomen and pelvis shows high-grade small bowel obstruction and on-call surgeon Dr. Excell Seltzer was consulted by the ER physician and patient has been admitted for further management. Patient has been placed on NG tube suction following which large amounts of fluid has been suctioned. As per the daughter patient has not complained of any abdominal pain as such. Patient does not recall her last bowel movement. Denies any chest pain or shortness of breath. Has not had any previous abdominal surgery as per the patient's daughter.   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 Upon admission, was 2.37  -Likely secondary to dehydration and poor oral intake -Creatinine improving, currently 0.83  Normocytic anemia -Hb 10.3, baseline 11 -Continue to monitor CBC  Right-sided hydronephrosis -No obstructing lesions noted on CT: Right hydroureteronephrosis, no obstructing lesion or stone -Will continue to monitor creatinine closely  Multiple liver lesions -Noted on CT of the abdomen, patient will need outpatient follow-up and monitoring  Hypertension -Home medications held, continue IV hydralazine PRN  Hyperlipidemia -Home medications held due to NPO  Dementia -Home medications currently held due to NPO -Per daughter at bedside, patient has dementia, but is able to confabulate well  Leukocytosis -Resolved, Likely secondary to SBO -continue to monitor CBC -patient is currently afebrile  Hypokalemia -Resolved, Likely secondary to nausea and vomiting,  -Continue to monitor BMP and replace as needed -Magnesium 2.2  Headache -Continue pain control  Code Status: Full  Family Communication: Daughter at bedside  Disposition Plan: Admitted. Continues to have NGT.  Time Spent in minutes 30 minutes  Procedures  Right colectomy  Consults  General surgery  DVT Prophylaxis Heparin  Lab Results  Component Value Date   PLT 228 01/26/2015    Medications  Scheduled Meds: . bisacodyl  10 mg Rectal Daily  . heparin  subcutaneous  5,000 Units Subcutaneous 3 times per day  . hydrocerin   Topical BID  . lip balm  1 application Topical BID  . metoprolol  5 mg Intravenous 4 times per day   Continuous Infusions: . dextrose 5 % and 0.45 % NaCl with KCl 20 mEq/L 100 mL/hr at 01/26/15 0340   PRN Meds:.acetaminophen, alum & mag hydroxide-simeth, diphenhydrAMINE, lactated ringers, magic mouthwash, menthol-cetylpyridinium, morphine injection, ondansetron **OR** ondansetron  (ZOFRAN) IV, ondansetron, phenol, promethazine  Antibiotics    Anti-infectives    Start     Dose/Rate Route Frequency Ordered Stop   01/24/15 0400  cefoTEtan (CEFOTAN) 2 g in dextrose 5 % 50 mL IVPB     2 g 100 mL/hr over 30 Minutes Intravenous Every 12 hours 01/23/15 2116 01/24/15 0455   01/23/15 1615  ceFAZolin (ANCEF) IVPB 2 g/50 mL premix     2 g 100 mL/hr over 30 Minutes Intravenous  Once 01/23/15 1614 01/23/15 1655      Subjective:   [name] seen and examined today. Patient has no complaints this morning. Denies any chest pain, shortness of breath. Has not had any flatness or bowel movement.  Objective:   Filed Vitals:   01/25/15 1228 01/25/15 2022 01/26/15 0200 01/26/15 0430  BP: 120/69 116/58 117/69 122/75  Pulse: 106 100 102 93  Temp:  98.9 F (37.2 C) 98.5 F (36.9 C) 99 F (37.2 C)  TempSrc:  Oral Oral Oral  Resp:  16 16 16   Height:      Weight:      SpO2:  100% 100% 100%    Wt Readings from Last 3 Encounters:  01/23/15 47.174 kg (104 lb)  09/02/14 54.432 kg (120 lb)  03/03/14 56.7 kg (125 lb)     Intake/Output Summary (Last 24 hours) at 01/26/15 1027 Last data filed at 01/26/15 0841  Gross per 24 hour  Intake   1265 ml  Output   1375 ml  Net   -110 ml    Exam  General: Well developed, well nourished, NAD  HEENT: NCAT, mucous membranes moist. NGT in place  Cardiovascular: S1 S2 auscultated, RRR, no murmurs  Respiratory: Clear to auscultation   Abdomen: Soft, nontender, mildly distended, + bowel sounds, midline dressing with VAC in place  Extremities: warm dry without cyanosis clubbing or edema  Neuro: AAOx2 (person and place, not time)  Nonfocal  Psych: Appropriate mood and affect   Data Review   Micro Results No results found for this or any previous visit (from the past 240 hour(s)).  Radiology Reports Ct Abdomen Pelvis Wo Contrast  01/21/2015  CLINICAL DATA:  Abdominal distention for 4 days with nausea and vomiting.  Decreased urine output. EXAM: CT ABDOMEN AND PELVIS WITHOUT CONTRAST TECHNIQUE: Multidetector CT imaging of the abdomen and pelvis was performed following the standard protocol without IV contrast. COMPARISON:  None. FINDINGS: Lower chest:  The included lung bases are clear. Liver: Ladder density lesion in the anterior subcapsular left lobe measures 3.6 cm and is consistent with simple cyst. Additional smaller hypodense lesions in the left and anterior right lobe are incompletely characterized without contrast. Hepatobiliary: Gallbladder physiologically distended, displaced laterally by dilated stomach. No calcified gallstone. Pancreas: Normal.  No ductal dilatation or surrounding inflammation. Spleen: Small soft tissue density in the left upper quadrant, likely diminutive spleen. Adrenal glands: No nodule. Kidneys: Multiple bilateral low-density lesions in both kidneys consistent with cysts. Mild right hydroureteronephrosis, distal ureter is suboptimally defined. There is an nonobstructing stone  in the lower right kidney. No ureteral calculi. No left hydronephrosis. Stomach/Bowel: Stomach markedly distended with fluid. Diffuse fluid-filled dilated small bowel, measuring up to 4.1 cm. Transition point suspected in the right lower quadrant, however no definite decompressed small bowel is seen. No pneumatosis. Small volume of stool throughout the colon without colonic wall thickening. The ileocecal valve not well-defined. The appendix is not definitively seen. Vascular/Lymphatic: No retroperitoneal adenopathy. Abdominal aorta is normal in caliber. Dense atherosclerosis of the abdominal aorta and its branches. Reproductive: Uterus appears in situ, suboptimally defined due to fluid-filled adjacent bowel loops. Limited assessment for adnexal mass. Bladder: Minimally distended, irregular/tubular in shape. No wall thickening. Displaced posteriorly due to dilated small bowel. Other: Mild mesenteric edema centrally.  No free  air or free fluid. Musculoskeletal: There are no acute or suspicious osseous abnormalities. Advanced facet arthropathy in the lumbar spine. Transitional lumbosacral anatomy. IMPRESSION: 1. High-grade small bowel obstruction with transition point in the right lower quadrant. No definite decompressed small bowel loops are seen, suspect distal ileum a site of obstruction. No perforation, pneumatosis or free fluid. 2. Right hydroureteronephrosis, however no obstructing lesion or stone. There is a nonobstructing stone in the lower right kidney. Multiple bilateral renal cysts. 3. Multiple liver lesions, largest represents a simple cyst. Smaller lesions are incompletely characterized without contrast. Electronically Signed   By: Jeb Levering M.D.   On: 01/21/2015 03:26   Dg Abd Portable 1v  01/23/2015  CLINICAL DATA:  Small bowel obstruction EXAM: PORTABLE ABDOMEN - 1 VIEW COMPARISON:  Abdominal series of January 22, 2015 FINDINGS: This single supine abdominal film reveals several moderately distended air-filled and fluid-filled bowel loops. This is predominantly small bowel. Contrast with things will within these loops is present from the CT scan of 3 days ago. There is a small amount of gas within portions of the colon. There is no definite rectal gas. The nasogastric tube tip lies in the pylorus or proximal duodenum with the proximal port in the pre-pyloric region. No free extraluminal gas is demonstrated. IMPRESSION: Persistent distal small bowel obstruction. There has not been significant progression of contrast into the colon since the abdominal CT scan of 15 November. Electronically Signed   By: David  Martinique M.D.   On: 01/23/2015 07:09   Dg Abd Portable 1v  01/22/2015  CLINICAL DATA:  Nausea, vomiting, abdominal distension, small bowel obstruction, hypertension, dementia, GERD EXAM: PORTABLE ABDOMEN - 1 VIEW COMPARISON:  01/21/2015 FINDINGS: Contrast opacifies multiple dilated small bowel loops.  Persistent small bowel dilatation unchanged previous exam. Nasogastric tube coiled in stomach with tip projecting over pylorus or proximal duodenum. No definite bowel wall thickening. No significant colonic gas or stool. Bones demineralized. IMPRESSION: Persistent small bowel dilatation compatible with obstruction. Electronically Signed   By: Lavonia Dana M.D.   On: 01/22/2015 07:51   Dg Abd Portable 1v-small Bowel Obstruction Protocol-initial, 8 Hr Delay  01/21/2015  CLINICAL DATA:  8 hour delay film EXAM: PORTABLE ABDOMEN - 1 VIEW COMPARISON:  Earlier film of the same day FINDINGS: Nasogastric tube tip probably just beyond the pylorus. Multiple dilated small bowel loops as before. Some enteral contrast is now evident, predominantly in dilated small bowel loops. The colon appears decompressed. Degenerative changes in the lower lumbar spine. IMPRESSION: 1. Stable nasogastric tube into the proximal duodenum, with little change in appearance of small bowel obstruction. Electronically Signed   By: Lucrezia Europe M.D.   On: 01/21/2015 17:39   Dg Abd Portable 1v-small Bowel Protocol-position Verification  01/21/2015  CLINICAL DATA:  Check nasogastric catheter placement EXAM: PORTABLE ABDOMEN - 1 VIEW COMPARISON:  01/20/2015 FINDINGS: A nasogastric catheter is noted coiled within the stomach. The tip is directed towards the pyloric channel. The stomach has been decompressed when compared with prior exam. The small bowel remains dilated similar to that seen on recent CT examination. Degenerative changes of lumbar spine are seen. IMPRESSION: Stable small bowel dilatation. Nasogastric catheter within the stomach. Electronically Signed   By: Inez Catalina M.D.   On: 01/21/2015 08:38    CBC  Recent Labs Lab 01/20/15 2247 01/21/15 0745 01/22/15 0350 01/23/15 0355 01/24/15 0410 01/25/15 0436 01/26/15 0435  WBC 12.9* 13.6* 11.8* 10.9* 10.5 10.0 8.9  HGB 13.5 12.0 11.9* 11.5* 11.3* 10.2* 10.3*  HCT 38.8 34.7*  35.2* 33.5* 33.0* 30.1* 30.1*  PLT 319 298 291 267 251 225 228  MCV 83.8 83.4 86.1 87.0 86.6 86.7 86.7  MCH 29.2 28.8 29.1 29.9 29.7 29.4 29.7  MCHC 34.8 34.6 33.8 34.3 34.2 33.9 34.2  RDW 14.5 14.5 14.9 15.1 15.2 15.3 15.2  LYMPHSABS 2.3 1.5  --   --   --   --   --   MONOABS 0.8 0.6  --   --   --   --   --   EOSABS 0.0 0.0  --   --   --   --   --   BASOSABS 0.0 0.0  --   --   --   --   --     Chemistries   Recent Labs Lab 01/20/15 2247 01/21/15 0745 01/22/15 0350 01/23/15 0355 01/24/15 0410 01/25/15 0436 01/26/15 0435  NA 129* 130* 136 138 137 138 137  K 3.7 3.1* 3.2* 3.3* 4.0 3.8 3.7  CL 82* 88* 97* 98* 102 107 107  CO2 30 25 25 26 24 24 24   GLUCOSE 122* 97 85 79 190* 136* 110*  BUN 74* 67* 52* 38* 30* 16 9  CREATININE 2.37* 1.91* 1.56* 1.28* 1.18* 0.97 0.83  CALCIUM 9.2 8.4* 8.4* 8.6* 7.9* 7.8* 7.7*  MG  --   --  2.2  --   --   --   --   AST 27 25  --   --   --   --   --   ALT 23 22  --   --   --   --   --   ALKPHOS 74 66  --   --   --   --   --   BILITOT 1.3* 1.4*  --   --   --   --   --    ------------------------------------------------------------------------------------------------------------------ estimated creatinine clearance is 45.7 mL/min (by C-G formula based on Cr of 0.83). ------------------------------------------------------------------------------------------------------------------ No results for input(s): HGBA1C in the last 72 hours. ------------------------------------------------------------------------------------------------------------------ No results for input(s): CHOL, HDL, LDLCALC, TRIG, CHOLHDL, LDLDIRECT in the last 72 hours. ------------------------------------------------------------------------------------------------------------------ No results for input(s): TSH, T4TOTAL, T3FREE, THYROIDAB in the last 72 hours.  Invalid input(s):  FREET3 ------------------------------------------------------------------------------------------------------------------  Recent Labs  01/26/15 0435  VITAMINB12 2280*  FOLATE 11.6  FERRITIN 222  TIBC 140*  IRON 19*  RETICCTPCT 1.1    Coagulation profile No results for input(s): INR, PROTIME in the last 168 hours.  No results for input(s): DDIMER in the last 72 hours.  Cardiac Enzymes No results for input(s): CKMB, TROPONINI, MYOGLOBIN in the last 168 hours.  Invalid input(s): CK ------------------------------------------------------------------------------------------------------------------ Invalid input(s): POCBNP    Jasman Murri D.O. on 01/26/2015 at  10:27 AM  Between 7am to 7pm - Pager - [phone]  After 7pm go to [email or web address] - password [email or web address]  And look for the night coverage person covering for me after hours  [facility] Office  [phone]

## 2015-01-26 NOTE — Progress Notes (Signed)
[facility] Progress Note  3 Days Post-Op  Subjective: Feels okay, no N/V, some bloating, not much pain.  Daughter is so thankful for our care of her mom.  No flatus or BM yet.  No BS yet.  Of course she wants the NG out, but knows it's not ready to come out yet.  Objective: Vital signs in last 24 hours: Temp:  [98.5 F (36.9 C)-99 F (37.2 C)] 99 F (37.2 C) (11/21 0430) Pulse Rate:  [93-106] 93 (11/21 0430) Resp:  [16] 16 (11/21 0430) BP: (116-122)/(58-75) 122/75 mmHg (11/21 0430) SpO2:  [100 %] 100 % (11/21 0430) Last BM Date: 01/22/15  Intake/Output from previous day: 11/20 0701 - 11/21 0700 In: 1265 [I.V.:840; NG/GT:425] Out: 1125 [Urine:1125] Intake/Output this shift:    PE: Gen:  Alert, NAD, pleasant Abd: Soft, mild distension, mild tenderness, diminished BS, no HSM, incisions covered with incisional wound vac with minimal output   Lab Results:   Recent Labs  01/25/15 0436 01/26/15 0435  WBC 10.0 8.9  HGB 10.2* 10.3*  HCT 30.1* 30.1*  PLT 225 228   BMET  Recent Labs  01/25/15 0436 01/26/15 0435  NA 138 137  K 3.8 3.7  CL 107 107  CO2 24 24  GLUCOSE 136* 110*  BUN 16 9  CREATININE 0.97 0.83  CALCIUM 7.8* 7.7*   PT/INR No results for input(s): LABPROT, INR in the last 72 hours. CMP     Component Value Date/Time   NA 137 01/26/2015 0435   K 3.7 01/26/2015 0435   CL 107 01/26/2015 0435   CO2 24 01/26/2015 0435   GLUCOSE 110* 01/26/2015 0435   BUN 9 01/26/2015 0435   CREATININE 0.83 01/26/2015 0435   CALCIUM 7.7* 01/26/2015 0435   PROT 6.9 01/21/2015 0745   ALBUMIN 3.9 01/21/2015 0745   AST 25 01/21/2015 0745   ALT 22 01/21/2015 0745   ALKPHOS 66 01/21/2015 0745   BILITOT 1.4* 01/21/2015 0745   GFRNONAA >60 01/26/2015 0435   GFRAA >60 01/26/2015 0435   Lipase     Component Value Date/Time   LIPASE 55* 01/20/2015 2247       Studies/Results: No results found.  Anti-infectives: Anti-infectives    Start      Dose/Rate Route Frequency Ordered Stop   01/24/15 0400  cefoTEtan (CEFOTAN) 2 g in dextrose 5 % 50 mL IVPB     2 g 100 mL/hr over 30 Minutes Intravenous Every 12 hours 01/23/15 2116 01/24/15 0455   01/23/15 1615  ceFAZolin (ANCEF) IVPB 2 g/50 mL premix     2 g 100 mL/hr over 30 Minutes Intravenous  Once 01/23/15 1614 01/23/15 1655       Assessment/Plan High grade SBO secondary to chronic ruptured appendicitis, POD #3 s/p Diagnostic laparoscopy, Exploratory lap, Right hemi-colectomy for obstruction of right terminal ileum -NPO, NG tube (clamp to walk), IVF, pain control, antiemetics -Hopefully try clamping trials tomorrow -IS and Ambulate -Has incisional wound vac which can stay on up to 7 days, d/c at discharge, staples to be removed POD #10 -No need for antibiotics at this time - WBC trending down -CEA - 5.7 (mild elevation), some concern for malignancy, but none seen on CT. She has had a CSP by [facility] 3-4 years ago and recently did a heme occult send out test which was negative for blood. -PT ordered -D/c foley  Leukocytosis - down to 10.9 Acute renal failure - Cr 0.83 Right-sided hydronephrosis Multiple liver lesions HTN HLD  Dementia DVT Proph - SCDs, SQ heparin    LOS: 5 days    Nat Christen 01/26/2015, 8:12 AM Pager: [phone]

## 2015-01-27 ENCOUNTER — Telehealth: Payer: Self-pay | Admitting: Hematology

## 2015-01-27 LAB — MAGNESIUM: Magnesium: 1.7 mg/dL (ref 1.7–2.4)

## 2015-01-27 LAB — BASIC METABOLIC PANEL
Anion gap: 8 (ref 5–15)
BUN: 8 mg/dL (ref 6–20)
CHLORIDE: 103 mmol/L (ref 101–111)
CO2: 25 mmol/L (ref 22–32)
Calcium: 8.2 mg/dL — ABNORMAL LOW (ref 8.9–10.3)
Creatinine, Ser: 0.75 mg/dL (ref 0.44–1.00)
GFR calc Af Amer: >60 mL/min (ref >60)
GFR calc non Af Amer: >60 mL/min (ref >60)
GLUCOSE: 109 mg/dL — AB (ref 65–99)
POTASSIUM: 3.3 mmol/L — AB (ref 3.5–5.1)
Sodium: 136 mmol/L (ref 135–145)

## 2015-01-27 LAB — CBC
HEMATOCRIT: 31.4 % — AB (ref 36.0–46.0)
HEMOGLOBIN: 10.7 g/dL — AB (ref 12.0–15.0)
MCH: 29.2 pg (ref 26.0–34.0)
MCHC: 34.1 g/dL (ref 30.0–36.0)
MCV: 85.8 fL (ref 78.0–100.0)
Platelets: 254 10*3/uL (ref 150–400)
RBC: 3.66 MIL/uL — AB (ref 3.87–5.11)
RDW: 15.1 % (ref 11.5–15.5)
WBC: 7.8 10*3/uL (ref 4.0–10.5)

## 2015-01-27 MED ORDER — MAGNESIUM SULFATE 50 % IJ SOLN
3.0000 g | Freq: Once | INTRAVENOUS | Status: AC
  Administered 2015-01-27: 3 g via INTRAVENOUS
  Filled 2015-01-27: qty 6

## 2015-01-27 MED ORDER — SODIUM CHLORIDE 0.9 % IV SOLN
510.0000 mg | Freq: Once | INTRAVENOUS | Status: AC
  Administered 2015-01-27: 510 mg via INTRAVENOUS
  Filled 2015-01-27: qty 17

## 2015-01-27 MED ORDER — POTASSIUM CHLORIDE 10 MEQ/100ML IV SOLN
10.0000 meq | INTRAVENOUS | Status: AC
  Administered 2015-01-27 (×4): 10 meq via INTRAVENOUS
  Filled 2015-01-27 (×6): qty 100

## 2015-01-27 NOTE — Telephone Encounter (Signed)
S/w patient dtr Lorriane Shire and gave np appt for 12/01 @ 11:30 w/Dr. Burr Medico  Referring Dr. [name] Dx- Colon Cancer

## 2015-01-27 NOTE — Care Management Important Message (Signed)
Important Message  Patient Details  Name: UZMA TROLLINGER MRN: [id number] Date of Birth: 08-07-1940   Medicare Important Message Given:  Yes    Camillo Flaming 01/27/2015, 11:24 AMImportant Message  Patient Details  Name: ZAVIERA SIDDIQUI MRN: [id number] Date of Birth: 12/28/1940   Medicare Important Message Given:  Yes    Camillo Flaming 01/27/2015, 11:24 AM

## 2015-01-27 NOTE — Evaluation (Signed)
Physical Therapy Evaluation Patient Details Name: Beth Wilkins MRN: [id number] DOB: 01-27-1941 Today's Date: 01/27/2015   History of Present Illness  74 y.o. female with h/o pelvic fx admitted with SBO, s/p R hemicolectomy.  Clinical Impression  Pt ambulated 400' without an assistive device, no loss of balance. Encouraged pt to walk 3x/day with nursing or family. No further acute PT needed as pt is independent with mobility.     Follow Up Recommendations No PT follow up    Equipment Recommendations  None recommended by PT    Recommendations for Other Services       Precautions / Restrictions Precautions Precautions: Fall Precaution Comments: no falls in past year Restrictions Weight Bearing Restrictions: No      Mobility  Bed Mobility               General bed mobility comments: Pt found seated in recliner upon OT/PT entering room and left in recliner upon exiting room   Transfers Overall transfer level: Needs assistance Equipment used: None Transfers: Sit to/from Stand Sit to Stand: Supervision         General transfer comment: Supervision to ensure safety  Ambulation/Gait Ambulation/Gait assistance: Independent Ambulation Distance (Feet): 400 Feet Assistive device: None Gait Pattern/deviations: WFL(Within Functional Limits)     General Gait Details: steady, no LOB  Stairs            Wheelchair Mobility    Modified Rankin (Stroke Patients Only)       Balance Overall balance assessment: Needs assistance Sitting-balance support: No upper extremity supported;Feet supported Sitting balance-Leahy Scale: Good     Standing balance support: No upper extremity supported;During functional activity Standing balance-Leahy Scale: Fair                               Pertinent Vitals/Pain Pain Assessment: No/denies pain    Home Living Family/patient expects to be discharged to:: Private residence Living Arrangements:  Children;Other relatives Available Help at Discharge: Family;Available 24 hours/day Type of Home: House Home Access: Stairs to enter   [employer] of Steps: small "step up"  Home Layout: One level Home Equipment: Shower seat;Hand held Tourist information centre manager - 2 wheels;Cane - single point;Bedside commode      Prior Function Level of Independence: Needs assistance   Gait / Transfers Assistance Needed: independent  ADL's / Homemaking Assistance Needed: supervision at times  Comments: Daughter reports she provides supervision and IADLs for patient     Hand Dominance   Dominant Hand: Right    Extremity/Trunk Assessment   Upper Extremity Assessment: Overall WFL for tasks assessed           Lower Extremity Assessment: Overall WFL for tasks assessed      Cervical / Trunk Assessment: Normal  Communication   Communication: No difficulties  Cognition Arousal/Alertness: Awake/alert Behavior During Therapy: WFL for tasks assessed/performed Overall Cognitive Status: Impaired/Different from baseline (pt's daughter stated is more confused than at baseline) Area of Impairment: Orientation;Following commands;Safety/judgement;Awareness;Problem solving Orientation Level: Disoriented to;Time;Situation;Place     Following Commands: Follows one step commands with increased time Safety/Judgement: Decreased awareness of safety;Decreased awareness of deficits Awareness: Intellectual Problem Solving: Slow processing General Comments: Pt with h/o dementia    General Comments      Exercises        Assessment/Plan    PT Assessment Patent does not need any further PT services  PT Diagnosis     PT Problem  List    PT Treatment Interventions     PT Goals (Current goals can be found in the Care Plan section) Acute Rehab PT Goals Patient Stated Goal: to be outside PT Goal Formulation: All assessment and education complete, DC therapy    Frequency     Barriers to discharge         Co-evaluation PT/OT/SLP Co-Evaluation/Treatment: Yes Reason for Co-Treatment: For patient/therapist safety PT goals addressed during session: Mobility/safety with mobility OT goals addressed during session: ADL's and self-care       End of Session   Activity Tolerance: Patient tolerated treatment well Patient left: in chair;with call bell/phone within reach;with family/visitor present Nurse Communication: Mobility status         Time: [id number] PT Time Calculation (min) (ACUTE ONLY): 33 min   Charges:   PT Evaluation $Initial PT Evaluation Tier I: 1 Procedure     PT G Codes:        Philomena Doheny 01/27/2015, 10:43 AM [phone]

## 2015-01-27 NOTE — Care Management Note (Signed)
Case Management Note  Patient Details  Name: Beth Wilkins MRN: [id number] Date of Birth: 12-01-1940    Expected Discharge Date:   (unknown)               Expected Discharge Plan:  Home/Self Care  In-House Referral:     Discharge planning Services  CM Consult  Post Acute Care Choice:    Choice offered to:     DME Arranged:    DME Agency:     HH Arranged:    HH Agency:     Status of Service:  In process, will continue to follow  Medicare Important Message Given:  Yes Date Medicare IM Given:    Medicare IM give by:    Date Additional Medicare IM Given:    Additional Medicare Important Message give by:     If discussed at [place] of Stay Meetings, dates discussed:    Additional Comments: Pt from home with daughter. Met with pt and daughter at bedside to discuss request for home health services. Daughter states she really just needs someone to stay with pt a couple of hours when she has to go out of the house. Daughter states she takes care of her medication and PT did not recommend any PT follow up. Private Duty Care list provided to pt. No other CM needs communicated. Lynnell Catalan, RN 01/27/2015, 3:15 PM

## 2015-01-27 NOTE — Progress Notes (Signed)
[facility] Progress Note  4 Days Post-Op  Subjective: Daughter in room Feels okay No N/V, some bloating, not much pain.   BM x 4 Walking more w PT  Objective: Vital signs in last 24 hours: Temp:  [98 F (36.7 C)-98.9 F (37.2 C)] 98 F (36.7 C) (11/22 0538) Pulse Rate:  [85-104] 100 (11/22 0538) Resp:  [15-20] 20 (11/22 0538) BP: (112-128)/(66-75) 128/72 mmHg (11/22 0538) SpO2:  [100 %] 100 % (11/22 0538) Last BM Date: 01/27/15  Intake/Output from previous day: 11/21 0701 - 11/22 0700 In: 2898.2 [P.O.:30; I.V.:2868.2] Out: 1200 [Urine:400; Emesis/NG output:800] Intake/Output this shift:    PE: General: Pt awake/alert/oriented x2 in no major acute distress Eyes: PERRL, normal EOM. Sclera nonicteric Neuro: CN II-XII intact w/o focal sensory/motor deficits. Lymph: No head/neck/groin lymphadenopathy Psych:  No delerium/psychosis/paranoia HENT: Normocephalic, Mucus membranes moist.  No thrush.  NGT unhooked from wall - I clamped Neck: Supple, No tracheal deviation Chest: No pain.  Good respiratory excursion. CV:  Pulses intact.  Regular rhythm MS: Normal AROM mjr joints.  No obvious deformity Abdomen: Soft,  Mod distended. Nontender.  No incarcerated hernias.  Incisional wound vac in place Ext:  SCDs BLE.  No significant edema.  No cyanosis Skin: No petechiae / purpura    Lab Results:   Recent Labs  01/26/15 0435 01/27/15 0406  WBC 8.9 7.8  HGB 10.3* 10.7*  HCT 30.1* 31.4*  PLT 228 254   BMET  Recent Labs  01/26/15 0435 01/27/15 0406  NA 137 136  K 3.7 3.3*  CL 107 103  CO2 24 25  GLUCOSE 110* 109*  BUN 9 8  CREATININE 0.83 0.75  CALCIUM 7.7* 8.2*   PT/INR No results for input(s): LABPROT, INR in the last 72 hours. CMP     Component Value Date/Time   NA 136 01/27/2015 0406   K 3.3* 01/27/2015 0406   CL 103 01/27/2015 0406   CO2 25 01/27/2015 0406   GLUCOSE 109* 01/27/2015 0406   BUN 8 01/27/2015 0406   CREATININE 0.75  01/27/2015 0406   CALCIUM 8.2* 01/27/2015 0406   PROT 6.9 01/21/2015 0745   ALBUMIN 3.9 01/21/2015 0745   AST 25 01/21/2015 0745   ALT 22 01/21/2015 0745   ALKPHOS 66 01/21/2015 0745   BILITOT 1.4* 01/21/2015 0745   GFRNONAA >60 01/27/2015 0406   GFRAA >60 01/27/2015 0406   Lipase     Component Value Date/Time   LIPASE 55* 01/20/2015 2247       Studies/Results: Ct Chest W Contrast  01/26/2015  CLINICAL DATA:  Hypoxia.  Bowel obstruction. EXAM: CT CHEST WITH CONTRAST TECHNIQUE: Multidetector CT imaging of the chest was performed during intravenous contrast administration. CONTRAST:  59mL OMNIPAQUE IOHEXOL 300 MG/ML  SOLN COMPARISON:  None FINDINGS: Mediastinum: The heart size appears normal. There is no pericardial effusion identified. The trachea is patent and is midline. There is a nasogastric tube within the esophagus. Tip is looped within the stomach. There is no mediastinal or hilar adenopathy. No enlarged axillary or supraclavicular lymph nodes. Lungs/Pleura: Small left pleural effusion identified. Moderate changes of centrilobular emphysema identified. There is a discs shaped nodule within the right upper lobe which measures 5 mm, image 29 of series 603 and image 16 of series 15. Subsegmental atelectasis noted in the left base. Upper Abdomen: There is a low density structure in the lateral segment of left lobe measuring 7 mm. This is too small to characterize. Renal cysts noted. Musculoskeletal: Mild  degenerative disc disease noted within the thoracic spine. IMPRESSION: 1. Small left pleural effusion. 2. Right upper lobe lung nodule measures 5 mm. If the patient is at high risk for bronchogenic carcinoma, follow-up chest CT at 6-12 months is recommended. If the patient is at low risk for bronchogenic carcinoma, follow-up chest CT at 12 months is recommended. This recommendation follows the consensus statement: Guidelines for Management of Small Pulmonary Nodules Detected on CT Scans: A  Statement from the [place] as published in Radiology 2005;237:395-400. 3. Emphysema. Electronically Signed   By: Kerby Moors M.D.   On: 01/26/2015 15:51    Anti-infectives: Anti-infectives    Start     Dose/Rate Route Frequency Ordered Stop   01/24/15 0400  cefoTEtan (CEFOTAN) 2 g in dextrose 5 % 50 mL IVPB     2 g 100 mL/hr over 30 Minutes Intravenous Every 12 hours 01/23/15 2116 01/24/15 0455   01/23/15 1615  ceFAZolin (ANCEF) IVPB 2 g/50 mL premix     2 g 100 mL/hr over 30 Minutes Intravenous  Once 01/23/15 1614 01/23/15 1655     Principal Problem:   Perforated appendix s/p right colectomy 01/24/2015 Active Problems:   HTN (hypertension)   Moderate dementia without behavioral disturbance   SBO (small bowel obstruction) (HCC)   ARF (acute renal failure) (HCC)   Hypokalemia   Leukocytosis   Absolute anemia    Assessment/Plan  High grade SBO secondary to obstructive mass at terminal ileum/appendix 01/24/2015 s/p Diagnostic laparoscopy, Exploratory lap, Right hemi-colectomy   -NPO,  -NG tube clamping trial - thin liquids.  If tolerates, d/c NGT in AM -IVF -pain control -antiemetics -IS and Ambulate -Has incisional wound vac which can stay on up to 7 days, d/c at discharge, staples to be removed POD #10 -No need for antibiotics at this time - WBC trending down  -CEA - 5.7 (mild elevation) & pathology for concern for malignancy, but none seen on CT. She has had a CSP by [facility] 3-4 years ago and recently did a heme occult send out test which was negative for blood.  -PT ordered   Leukocytosis - resolved Acute renal failure - resolved Right-sided hydronephrosis Multiple liver lesions HTN HLD Dementia DVT Proph - SCDs, SQ heparin    LOS: 6 days    Joevon Holliman C. 01/27/2015, 11:40 AM Pager: [phone]

## 2015-01-27 NOTE — Progress Notes (Addendum)
[facility]                                                                              Patient Demographics  Beth Wilkins, is a 74 y.o. female, DOB - 11-Jan-1941, [id number]  Admit date - 01/20/2015   Admitting Physician Rise Patience, MD  Outpatient Primary MD for the patient is [name], MD  LOS - 6   Chief Complaint  Patient presents with  . Dehydration      HPI on 01/21/2015 by Dr. Gean Birchwood Beth Wilkins is a 74 y.o. female with history of hypertension, hyperlipidemia, dementia was brought to the ER after patient has been having multiple episodes of nausea vomiting. As per patient's daughter who provided the history patient has been having decreased appetite and abdominal distention for almost 2-3 weeks now. Over the last 2-3 days patient has been having projectile vomiting which has not improved and patient was brought to the ER. CT abdomen and pelvis shows high-grade small bowel obstruction and on-call surgeon Dr. Excell Seltzer was consulted by the ER physician and patient has been admitted for further management. Patient has been placed on NG tube suction following which large amounts of fluid has been suctioned. As per the daughter patient has not complained of any abdominal pain as such. Patient does not recall her last bowel movement. Denies any chest pain or shortness of breath. Has not had any previous abdominal surgery as per the patient's daughter.   Interim history Patient found to have SBO secondary to chronic perf appendix.  Pathology still pending.  Per surgery in passing, may be secondary to malignancy.    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 Upon admission, was 2.37  -Likely secondary to dehydration and poor oral intake -Creatinine improving, currently 0.75  Normocytic anemia -Hb 10.7, baseline 11 -Anemia panel: Iron 19, ferritin 222  -One dose of feraheme given -Continue to monitor CBC  Right-sided hydronephrosis -No obstructing lesions noted on CT: Right hydroureteronephrosis, no obstructing lesion or stone -Will continue to monitor creatinine closely  Multiple liver lesions -Noted on CT of the abdomen, patient will need outpatient follow-up and monitoring  Hypertension -Home medications held, continue IV hydralazine PRN  Hyperlipidemia -Home medications held due to NPO  Dementia -Home medications currently held due to NPO -Per daughter at bedside, patient has dementia, but is able to confabulate well  Leukocytosis -Resolved, Likely secondary to SBO -continue to monitor CBC -patient is currently afebrile  Hypokalemia -Likely secondary to nausea and vomiting -K 3.3, will replace  -Continue to monitor BMP  -Magnesium 2.2  Headache -Continue pain control  Code Status: Full  Family Communication: Daughter at bedside  Disposition Plan: Admitted. Continues to have NGT. Pending further recommendations from surgery and pathology report.  Time Spent in minutes 30 minutes  Procedures  Right colectomy  Consults  General surgery  DVT Prophylaxis Heparin  Lab Results  Component Value Date   PLT 254 01/27/2015    Medications  Scheduled Meds: . bisacodyl  10 mg Rectal Daily  . heparin subcutaneous  5,000 Units Subcutaneous 3 times per day  . hydrocerin   Topical BID  . lip balm  1 application Topical BID  . metoprolol  5 mg Intravenous 4 times per day   . potassium chloride  10 mEq Intravenous Q1 Hr x 4   Continuous Infusions: . dextrose 5 % and 0.45 % NaCl with KCl 20 mEq/L 50 mL/hr at 01/26/15 1920   PRN Meds:.acetaminophen, alum & mag hydroxide-simeth, diphenhydrAMINE, lactated ringers, magic mouthwash, menthol-cetylpyridinium, morphine injection, ondansetron **OR** ondansetron (ZOFRAN) IV, ondansetron, phenol, promethazine  Antibiotics    Anti-infectives    Start     Dose/Rate Route Frequency Ordered Stop   01/24/15 0400  cefoTEtan (CEFOTAN) 2 g in dextrose 5 % 50 mL IVPB     2 g 100 mL/hr over 30 Minutes Intravenous Every 12 hours 01/23/15 2116 01/24/15 0455   01/23/15 1615  ceFAZolin (ANCEF) IVPB 2 g/50 mL premix     2 g 100 mL/hr over 30 Minutes Intravenous  Once 01/23/15 1614 01/23/15 1655      Subjective:   Beth Wilkins seen and examined today. Patient has no complaints this morning.  Was able to walk the halls of the unit this morning.  Denies any chest pain, shortness of breath. States she had a bowel movement.   Objective:   Filed Vitals:   01/26/15 2008 01/26/15 2036 01/27/15 0106 01/27/15 0538  BP:  124/70 112/66 128/72  Pulse: 100 104 101 100  Temp:  98.2 F (36.8 C)  98 F (36.7 C)  TempSrc:  Oral  Oral  Resp:  20  20  Height:      Weight:      SpO2:  100%  100%    Wt Readings from Last 3 Encounters:  01/23/15 47.174 kg (104 lb)  09/02/14 54.432 kg (120 lb)  03/03/14 56.7 kg (125 lb)     Intake/Output Summary (Last 24 hours) at 01/27/15 1151 Last data filed at 01/27/15 0536  Gross per 24 hour  Intake 2898.17 ml  Output    800 ml  Net 2098.17 ml    Exam  General: Well developed, well nourished, NAD  HEENT: NCAT, mucous membranes moist. NGT in place  Cardiovascular: S1 S2 auscultated, RRR, no murmurs  Respiratory: Clear to auscultation   Abdomen: Soft, nontender, mildly distended, + bowel sounds, midline dressing with VAC in place  Extremities: warm dry without cyanosis clubbing or  edema  Neuro: AAOx2 (person and place, not time)  Nonfocal  Psych: Appropriate mood and affect, pleasant  Data Review   Micro Results No results found for this or any previous visit (from the past 240 hour(s)).  Radiology Reports Ct Abdomen Pelvis Wo Contrast  01/21/2015  CLINICAL DATA:  Abdominal distention for 4 days with nausea and vomiting. Decreased urine output. EXAM: CT ABDOMEN AND PELVIS WITHOUT CONTRAST TECHNIQUE: Multidetector CT imaging of the abdomen and pelvis was performed following the standard protocol without IV contrast. COMPARISON:  None. FINDINGS: Lower chest:  The included lung bases are clear. Liver: Ladder density lesion in the anterior subcapsular left lobe measures 3.6 cm and is consistent with simple cyst. Additional smaller hypodense lesions in the left and anterior right lobe  are incompletely characterized without contrast. Hepatobiliary: Gallbladder physiologically distended, displaced laterally by dilated stomach. No calcified gallstone. Pancreas: Normal.  No ductal dilatation or surrounding inflammation. Spleen: Small soft tissue density in the left upper quadrant, likely diminutive spleen. Adrenal glands: No nodule. Kidneys: Multiple bilateral low-density lesions in both kidneys consistent with cysts. Mild right hydroureteronephrosis, distal ureter is suboptimally defined. There is an nonobstructing stone in the lower right kidney. No ureteral calculi. No left hydronephrosis. Stomach/Bowel: Stomach markedly distended with fluid. Diffuse fluid-filled dilated small bowel, measuring up to 4.1 cm. Transition point suspected in the right lower quadrant, however no definite decompressed small bowel is seen. No pneumatosis. Small volume of stool throughout the colon without colonic wall thickening. The ileocecal valve not well-defined. The appendix is not definitively seen. Vascular/Lymphatic: No retroperitoneal adenopathy. Abdominal aorta is normal in caliber. Dense  atherosclerosis of the abdominal aorta and its branches. Reproductive: Uterus appears in situ, suboptimally defined due to fluid-filled adjacent bowel loops. Limited assessment for adnexal mass. Bladder: Minimally distended, irregular/tubular in shape. No wall thickening. Displaced posteriorly due to dilated small bowel. Other: Mild mesenteric edema centrally.  No free air or free fluid. Musculoskeletal: There are no acute or suspicious osseous abnormalities. Advanced facet arthropathy in the lumbar spine. Transitional lumbosacral anatomy. IMPRESSION: 1. High-grade small bowel obstruction with transition point in the right lower quadrant. No definite decompressed small bowel loops are seen, suspect distal ileum a site of obstruction. No perforation, pneumatosis or free fluid. 2. Right hydroureteronephrosis, however no obstructing lesion or stone. There is a nonobstructing stone in the lower right kidney. Multiple bilateral renal cysts. 3. Multiple liver lesions, largest represents a simple cyst. Smaller lesions are incompletely characterized without contrast. Electronically Signed   By: Jeb Levering M.D.   On: 01/21/2015 03:26   Ct Chest W Contrast  01/26/2015  CLINICAL DATA:  Hypoxia.  Bowel obstruction. EXAM: CT CHEST WITH CONTRAST TECHNIQUE: Multidetector CT imaging of the chest was performed during intravenous contrast administration. CONTRAST:  23mL OMNIPAQUE IOHEXOL 300 MG/ML  SOLN COMPARISON:  None FINDINGS: Mediastinum: The heart size appears normal. There is no pericardial effusion identified. The trachea is patent and is midline. There is a nasogastric tube within the esophagus. Tip is looped within the stomach. There is no mediastinal or hilar adenopathy. No enlarged axillary or supraclavicular lymph nodes. Lungs/Pleura: Small left pleural effusion identified. Moderate changes of centrilobular emphysema identified. There is a discs shaped nodule within the right upper lobe which measures 5 mm,  image 29 of series 603 and image 16 of series 15. Subsegmental atelectasis noted in the left base. Upper Abdomen: There is a low density structure in the lateral segment of left lobe measuring 7 mm. This is too small to characterize. Renal cysts noted. Musculoskeletal: Mild degenerative disc disease noted within the thoracic spine. IMPRESSION: 1. Small left pleural effusion. 2. Right upper lobe lung nodule measures 5 mm. If the patient is at high risk for bronchogenic carcinoma, follow-up chest CT at 6-12 months is recommended. If the patient is at low risk for bronchogenic carcinoma, follow-up chest CT at 12 months is recommended. This recommendation follows the consensus statement: Guidelines for Management of Small Pulmonary Nodules Detected on CT Scans: A Statement from the [place] as published in Radiology 2005;237:395-400. 3. Emphysema. Electronically Signed   By: Kerby Moors M.D.   On: 01/26/2015 15:51   Dg Abd Portable 1v  01/23/2015  CLINICAL DATA:  Small bowel obstruction EXAM: PORTABLE ABDOMEN - 1 VIEW  COMPARISON:  Abdominal series of January 22, 2015 FINDINGS: This single supine abdominal film reveals several moderately distended air-filled and fluid-filled bowel loops. This is predominantly small bowel. Contrast with things will within these loops is present from the CT scan of 3 days ago. There is a small amount of gas within portions of the colon. There is no definite rectal gas. The nasogastric tube tip lies in the pylorus or proximal duodenum with the proximal port in the pre-pyloric region. No free extraluminal gas is demonstrated. IMPRESSION: Persistent distal small bowel obstruction. There has not been significant progression of contrast into the colon since the abdominal CT scan of 15 November. Electronically Signed   By: David  Martinique M.D.   On: 01/23/2015 07:09   Dg Abd Portable 1v  01/22/2015  CLINICAL DATA:  Nausea, vomiting, abdominal distension, small bowel  obstruction, hypertension, dementia, GERD EXAM: PORTABLE ABDOMEN - 1 VIEW COMPARISON:  01/21/2015 FINDINGS: Contrast opacifies multiple dilated small bowel loops. Persistent small bowel dilatation unchanged previous exam. Nasogastric tube coiled in stomach with tip projecting over pylorus or proximal duodenum. No definite bowel wall thickening. No significant colonic gas or stool. Bones demineralized. IMPRESSION: Persistent small bowel dilatation compatible with obstruction. Electronically Signed   By: Lavonia Dana M.D.   On: 01/22/2015 07:51   Dg Abd Portable 1v-small Bowel Obstruction Protocol-initial, 8 Hr Delay  01/21/2015  CLINICAL DATA:  8 hour delay film EXAM: PORTABLE ABDOMEN - 1 VIEW COMPARISON:  Earlier film of the same day FINDINGS: Nasogastric tube tip probably just beyond the pylorus. Multiple dilated small bowel loops as before. Some enteral contrast is now evident, predominantly in dilated small bowel loops. The colon appears decompressed. Degenerative changes in the lower lumbar spine. IMPRESSION: 1. Stable nasogastric tube into the proximal duodenum, with little change in appearance of small bowel obstruction. Electronically Signed   By: Lucrezia Europe M.D.   On: 01/21/2015 17:39   Dg Abd Portable 1v-small Bowel Protocol-position Verification  01/21/2015  CLINICAL DATA:  Check nasogastric catheter placement EXAM: PORTABLE ABDOMEN - 1 VIEW COMPARISON:  01/20/2015 FINDINGS: A nasogastric catheter is noted coiled within the stomach. The tip is directed towards the pyloric channel. The stomach has been decompressed when compared with prior exam. The small bowel remains dilated similar to that seen on recent CT examination. Degenerative changes of lumbar spine are seen. IMPRESSION: Stable small bowel dilatation. Nasogastric catheter within the stomach. Electronically Signed   By: Inez Catalina M.D.   On: 01/21/2015 08:38    CBC  Recent Labs Lab 01/20/15 2247 01/21/15 0745  01/23/15 0355  01/24/15 0410 01/25/15 0436 01/26/15 0435 01/27/15 0406  WBC 12.9* 13.6*  < > 10.9* 10.5 10.0 8.9 7.8  HGB 13.5 12.0  < > 11.5* 11.3* 10.2* 10.3* 10.7*  HCT 38.8 34.7*  < > 33.5* 33.0* 30.1* 30.1* 31.4*  PLT 319 298  < > 267 251 225 228 254  MCV 83.8 83.4  < > 87.0 86.6 86.7 86.7 85.8  MCH 29.2 28.8  < > 29.9 29.7 29.4 29.7 29.2  MCHC 34.8 34.6  < > 34.3 34.2 33.9 34.2 34.1  RDW 14.5 14.5  < > 15.1 15.2 15.3 15.2 15.1  LYMPHSABS 2.3 1.5  --   --   --   --   --   --   MONOABS 0.8 0.6  --   --   --   --   --   --   EOSABS 0.0 0.0  --   --   --   --   --   --  BASOSABS 0.0 0.0  --   --   --   --   --   --   < > = values in this interval not displayed.  Chemistries   Recent Labs Lab 01/20/15 2247 01/21/15 0745 01/22/15 0350 01/23/15 0355 01/24/15 0410 01/25/15 0436 01/26/15 0435 01/27/15 0406  NA 129* 130* 136 138 137 138 137 136  K 3.7 3.1* 3.2* 3.3* 4.0 3.8 3.7 3.3*  CL 82* 88* 97* 98* 102 107 107 103  CO2 30 25 25 26 24 24 24 25   GLUCOSE 122* 97 85 79 190* 136* 110* 109*  BUN 74* 67* 52* 38* 30* 16 9 8   CREATININE 2.37* 1.91* 1.56* 1.28* 1.18* 0.97 0.83 0.75  CALCIUM 9.2 8.4* 8.4* 8.6* 7.9* 7.8* 7.7* 8.2*  MG  --   --  2.2  --   --   --  1.3*  --   AST 27 25  --   --   --   --   --   --   ALT 23 22  --   --   --   --   --   --   ALKPHOS 74 66  --   --   --   --   --   --   BILITOT 1.3* 1.4*  --   --   --   --   --   --    ------------------------------------------------------------------------------------------------------------------ estimated creatinine clearance is 47.4 mL/min (by C-G formula based on Cr of 0.75). ------------------------------------------------------------------------------------------------------------------ No results for input(s): HGBA1C in the last 72 hours. ------------------------------------------------------------------------------------------------------------------ No results for input(s): CHOL, HDL, LDLCALC, TRIG, CHOLHDL, LDLDIRECT in  the last 72 hours. ------------------------------------------------------------------------------------------------------------------ No results for input(s): TSH, T4TOTAL, T3FREE, THYROIDAB in the last 72 hours.  Invalid input(s): FREET3 ------------------------------------------------------------------------------------------------------------------  Recent Labs  01/26/15 0435  VITAMINB12 2280*  FOLATE 11.6  FERRITIN 222  TIBC 140*  IRON 19*  RETICCTPCT 1.1    Coagulation profile No results for input(s): INR, PROTIME in the last 168 hours.  No results for input(s): DDIMER in the last 72 hours.  Cardiac Enzymes No results for input(s): CKMB, TROPONINI, MYOGLOBIN in the last 168 hours.  Invalid input(s): CK ------------------------------------------------------------------------------------------------------------------ Invalid input(s): POCBNP    Tatem Fesler D.O. on 01/27/2015 at 11:51 AM  Between 7am to 7pm - Pager - [phone]  After 7pm go to [email or web address] - password [email or web address]  And look for the night coverage person covering for me after hours  [facility] Office  [phone]

## 2015-01-27 NOTE — Progress Notes (Signed)
NGT residual checked, result was 0, NGT flushed with air per order. No c/o n/v. Had one loose BM this shift. Consumed 110 ml of gingerale and some bites of italian ice. Will continue to monitor.

## 2015-01-27 NOTE — Evaluation (Signed)
Occupational Therapy Evaluation Patient Details Name: Beth Wilkins MRN: [id number] DOB: 1941-01-29 Today's Date: 01/27/2015    History of Present Illness 74 y.o. female with h/o pelvic fx admitted with SBO, s/p R hemicolectomy.   Clinical Impression   Patient admitted with above. Patient independent to supervision PTA. Patient currently functioning at an overall supervision level.  No additional OT needs identified, D/C from acute OT services and no additional follow-up OT needs at this time. Patient's daughter able to approprietly assist patient at home, as she was providing prn supervision PTA. All appropriate education provided to patient and family (daughter). Please re-order OT if needed.      Follow Up Recommendations  No OT follow up;Supervision/Assistance - 24 hour    Equipment Recommendations  None recommended by OT    Recommendations for Other Services  None at this time   Precautions / Restrictions Precautions Precautions: Fall Precaution Comments: no falls in past year Restrictions Weight Bearing Restrictions: No   Mobility Bed Mobility General bed mobility comments: Pt found seated in recliner upon OT/PT entering room and left in recliner upon exiting room   Transfers Overall transfer level: Needs assistance Equipment used: None Transfers: Sit to/from Stand Sit to Stand: Supervision  General transfer comment: Supervision to ensure safety    Balance Overall balance assessment: Needs assistance Sitting-balance support: No upper extremity supported;Feet supported Sitting balance-Leahy Scale: Good     Standing balance support: No upper extremity supported;During functional activity Standing balance-Leahy Scale: Fair    ADL Overall ADL's : Needs assistance/impaired   General ADL Comments: Pt overall supervision for ADLs and functional mobility. Patient's daughter states she will be there to assist with all needs. Daughter states she was assisiting  patient some PTA. No additional OT needs identfied.     Pertinent Vitals/Pain Pain Assessment: No/denies pain     Hand Dominance Right   Extremity/Trunk Assessment Upper Extremity Assessment Upper Extremity Assessment: Overall WFL for tasks assessed   Lower Extremity Assessment Lower Extremity Assessment: Overall WFL for tasks assessed   Cervical / Trunk Assessment Cervical / Trunk Assessment: Normal   Communication Communication Communication: No difficulties   Cognition Arousal/Alertness: Awake/alert Behavior During Therapy: WFL for tasks assessed/performed Overall Cognitive Status: Impaired/Different from baseline Area of Impairment: Orientation;Following commands;Safety/judgement;Awareness;Problem solving Orientation Level: Disoriented to;Time;Situation;Place     Following Commands: Follows one step commands with increased time Safety/Judgement: Decreased awareness of safety;Decreased awareness of deficits Awareness: Intellectual Problem Solving: Slow processing General Comments: Pt with h/o dementia              Home Living Family/patient expects to be discharged to:: Private residence Living Arrangements: Children;Other relatives Available Help at Discharge: Family;Available 24 hours/day Type of Home: House Home Access: Stairs to enter [employer] of Steps: small "step up"    Home Layout: One level     Bathroom Shower/Tub: Teacher, early years/pre: Standard     Home Equipment: Shower seat;Hand held Tourist information centre manager - 2 wheels;Cane - single point;Bedside commode   Prior Functioning/Environment Level of Independence: Needs assistance  Gait / Transfers Assistance Needed: independent ADL's / Homemaking Assistance Needed: supervision at times Communication / Swallowing Assistance Needed: h/o dementia Comments: Daughter reports she provides supervision and IADLs for patient    OT Diagnosis: Generalized weakness;Cognitive deficits    OT Problem List:  n/a, no acute OT needs identified    OT Treatment/Interventions:   n/a, no acute OT needs identified    OT Goals(Current goals can be found  in the care plan section) Acute Rehab OT Goals Patient Stated Goal: none stated OT Goal Formulation: All assessment and education complete, DC therapy  OT Frequency:  n/a, no acute OT needs identified    Barriers to D/C: None known at this time       Co-evaluation PT/OT/SLP Co-Evaluation/Treatment: Yes Reason for Co-Treatment: For patient/therapist safety   OT goals addressed during session: ADL's and self-care      End of Session Equipment Utilized During Treatment: Other (comment) (NG tube and wound vac type device) Nurse Communication: Mobility status  Activity Tolerance: Patient tolerated treatment well Patient left: in chair;with call bell/phone within reach;with chair alarm set;with family/visitor present;with nursing/sitter in room   Time: [id number] OT Time Calculation (min): 34 min Charges:  OT General Charges $OT Visit: 1 Procedure OT Evaluation $Initial OT Evaluation Tier I: 1 Procedure  Angelica Frandsen , MS, OTR/L, CLT Pager: [phone]  01/27/2015, 10:35 AM

## 2015-01-28 LAB — BASIC METABOLIC PANEL
Anion gap: 6 (ref 5–15)
BUN: 7 mg/dL (ref 6–20)
CALCIUM: 8.3 mg/dL — AB (ref 8.9–10.3)
CO2: 23 mmol/L (ref 22–32)
CREATININE: 0.78 mg/dL (ref 0.44–1.00)
Chloride: 106 mmol/L (ref 101–111)
GFR calc Af Amer: >60 mL/min (ref >60)
GFR calc non Af Amer: >60 mL/min (ref >60)
GLUCOSE: 106 mg/dL — AB (ref 65–99)
Potassium: 3.9 mmol/L (ref 3.5–5.1)
Sodium: 135 mmol/L (ref 135–145)

## 2015-01-28 LAB — CBC
HEMATOCRIT: 32.4 % — AB (ref 36.0–46.0)
Hemoglobin: 11 g/dL — ABNORMAL LOW (ref 12.0–15.0)
MCH: 29.3 pg (ref 26.0–34.0)
MCHC: 34 g/dL (ref 30.0–36.0)
MCV: 86.2 fL (ref 78.0–100.0)
Platelets: 279 10*3/uL (ref 150–400)
RBC: 3.76 MIL/uL — ABNORMAL LOW (ref 3.87–5.11)
RDW: 15.2 % (ref 11.5–15.5)
WBC: 6.6 10*3/uL (ref 4.0–10.5)

## 2015-01-28 MED ORDER — ACETAMINOPHEN 325 MG PO TABS: 650.0000 mg | ORAL_TABLET | ORAL | Status: DC | PRN

## 2015-01-28 MED ORDER — HYDROCODONE-ACETAMINOPHEN 5-325 MG PO TABS: 1.0000 | ORAL_TABLET | ORAL | Status: DC | PRN

## 2015-01-28 MED ORDER — ACETAMINOPHEN 160 MG/5ML PO SOLN
650.0000 mg | Freq: Once | ORAL | Status: AC
  Administered 2015-01-28: 650 mg via ORAL

## 2015-01-28 NOTE — Progress Notes (Signed)
[facility] Progress Note  5 Days Post-Op  Subjective: Pt feels good, no N/V.  Ambulating well with therapy.  No pain.  +flatus and loose BM's.  Daughter at bedside.    Objective: Vital signs in last 24 hours: Temp:  [98.1 F (36.7 C)-98.2 F (36.8 C)] 98.2 F (36.8 C) (11/23 0450) Pulse Rate:  [87-97] 87 (11/23 0450) Resp:  [18-20] 18 (11/23 0450) BP: (110-129)/(61-73) 115/66 mmHg (11/23 0450) SpO2:  [100 %] 100 % (11/23 0450) Last BM Date: 01/27/15  Intake/Output from previous day: 11/22 0701 - 11/23 0700 In: 1101 [P.O.:60; I.V.:777] Out: 0  Intake/Output this shift:    PE: Gen:  Alert, NAD, pleasant Abd: Soft, NT, ND, +BS, no HSM, incisional wound vac in place, staples underneath, minimal drainage in vac canister.  Lab Results:   Recent Labs  01/27/15 0406 01/28/15 0438  WBC 7.8 6.6  HGB 10.7* 11.0*  HCT 31.4* 32.4*  PLT 254 279   BMET  Recent Labs  01/27/15 0406 01/28/15 0438  NA 136 135  K 3.3* 3.9  CL 103 106  CO2 25 23  GLUCOSE 109* 106*  BUN 8 7  CREATININE 0.75 0.78  CALCIUM 8.2* 8.3*   PT/INR No results for input(s): LABPROT, INR in the last 72 hours. CMP     Component Value Date/Time   NA 135 01/28/2015 0438   K 3.9 01/28/2015 0438   CL 106 01/28/2015 0438   CO2 23 01/28/2015 0438   GLUCOSE 106* 01/28/2015 0438   BUN 7 01/28/2015 0438   CREATININE 0.78 01/28/2015 0438   CALCIUM 8.3* 01/28/2015 0438   PROT 6.9 01/21/2015 0745   ALBUMIN 3.9 01/21/2015 0745   AST 25 01/21/2015 0745   ALT 22 01/21/2015 0745   ALKPHOS 66 01/21/2015 0745   BILITOT 1.4* 01/21/2015 0745   GFRNONAA >60 01/28/2015 0438   GFRAA >60 01/28/2015 0438   Lipase     Component Value Date/Time   LIPASE 55* 01/20/2015 2247       Studies/Results: Ct Chest W Contrast  01/26/2015  CLINICAL DATA:  Hypoxia.  Bowel obstruction. EXAM: CT CHEST WITH CONTRAST TECHNIQUE: Multidetector CT imaging of the chest was performed during intravenous contrast  administration. CONTRAST:  26mL OMNIPAQUE IOHEXOL 300 MG/ML  SOLN COMPARISON:  None FINDINGS: Mediastinum: The heart size appears normal. There is no pericardial effusion identified. The trachea is patent and is midline. There is a nasogastric tube within the esophagus. Tip is looped within the stomach. There is no mediastinal or hilar adenopathy. No enlarged axillary or supraclavicular lymph nodes. Lungs/Pleura: Small left pleural effusion identified. Moderate changes of centrilobular emphysema identified. There is a discs shaped nodule within the right upper lobe which measures 5 mm, image 29 of series 603 and image 16 of series 15. Subsegmental atelectasis noted in the left base. Upper Abdomen: There is a low density structure in the lateral segment of left lobe measuring 7 mm. This is too small to characterize. Renal cysts noted. Musculoskeletal: Mild degenerative disc disease noted within the thoracic spine. IMPRESSION: 1. Small left pleural effusion. 2. Right upper lobe lung nodule measures 5 mm. If the patient is at high risk for bronchogenic carcinoma, follow-up chest CT at 6-12 months is recommended. If the patient is at low risk for bronchogenic carcinoma, follow-up chest CT at 12 months is recommended. This recommendation follows the consensus statement: Guidelines for Management of Small Pulmonary Nodules Detected on CT Scans: A Statement from the [employer] as published  in Radiology 2005;237:395-400. 3. Emphysema. Electronically Signed   By: Kerby Moors M.D.   On: 01/26/2015 15:51    Anti-infectives: Anti-infectives    Start     Dose/Rate Route Frequency Ordered Stop   01/24/15 0400  cefoTEtan (CEFOTAN) 2 g in dextrose 5 % 50 mL IVPB     2 g 100 mL/hr over 30 Minutes Intravenous Every 12 hours 01/23/15 2116 01/24/15 0455   01/23/15 1615  ceFAZolin (ANCEF) IVPB 2 g/50 mL premix     2 g 100 mL/hr over 30 Minutes Intravenous  Once 01/23/15 1614 01/23/15 1655     Pathology  report: Colon, segmental resection, with terminal ileum and cecum - INVASIVE ADENOCARCINOMA EXTENDING INTO PERICECAL CONNECTIVE TISSUE AND INVOLVING SMALL INTESTINE. - THREE BENIGN LYMPH NODES (0/3). - Margins are free of tumor - Staging T4N0 - There is a colorectal-type adenocarcinoma arising in the cecum in the area of the appendiceal orifice. The tumor extends into the pericecal connective tissue, and involves the attached segment of terminal ileum. - Immunohistochemistry shows the tumor is positive with CDX2 and cytokeratin 20, and negative with cytokeratin 7, estrogen receptor and WT-1 supporting a diagnosis of primary colorectal adenocarcinoma.   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  They have a f/u appt with Dr. Burr Medico from Oncology next week. -Plan to D/c home Friday if tolerating solid diet   Leukocytosis - resolved Acute renal failure - Cr 0.78 Right-sided hydronephrosis Multiple liver lesions HTN HLD Dementia DVT Proph - SCDs, SQ heparin    LOS: 7 days    Nat Christen 01/28/2015, 8:19 AM Pager: [phone]

## 2015-01-28 NOTE — Progress Notes (Signed)
[facility]                                                                              Patient Demographics  Beth Wilkins, is a 74 y.o. female, DOB - Oct 30, 1940, [id number]  Admit date - 01/20/2015   Admitting Physician Rise Patience, MD  Outpatient Primary MD for the patient is [name], MD  LOS - 7  Subjective:   Seen with daughter at bedside. Reported passing gas and having loose bowel movement. Discussed the findings of adenocarcinoma, patient to see oncology as outpatient. Continue ambulation in hallways.  Chief Complaint  Patient presents with  . Dehydration      HPI on 01/21/2015 by Dr. Gean Birchwood Beth Wilkins is a 74 y.o. female with history of hypertension, hyperlipidemia, dementia was brought to the ER after patient has been having multiple episodes of nausea vomiting. As per patient's daughter who provided the history patient has been having decreased appetite and abdominal distention for almost 2-3 weeks now. Over the last 2-3 days patient has been having projectile vomiting which has not improved and patient was brought to the ER. CT abdomen and pelvis shows high-grade small bowel obstruction and on-call surgeon Dr. Excell Seltzer was consulted by the ER physician and patient has been admitted for further management. Patient has been placed on NG tube suction following which large amounts of fluid has been suctioned. As per the daughter patient has not complained of any abdominal pain as such. Patient does not recall her last bowel movement. Denies any chest pain or shortness of breath. Has not had any previous abdominal surgery as per the patient's daughter.   Interim history Patient found to have SBO secondary to chronic perf appendix.  Pathology showed invasive adenocarcinoma.    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 -Continue to follow progression may be home in 1-2 days.  Adenocarcinoma Involving the cecum, appendix and terminal ileum and involving the perirectal fat. Patient follow-up with oncology as outpatient primary is likely the colon.  Acute renal failure -Resolved. Upon admission, was 2.37  -Likely secondary to dehydration and poor oral intake -Creatinine improving, currently 0.75  Normocytic anemia -Hb 10.7, baseline 11 -Anemia panel: Iron 19,  Ferritin 22  -One dose of feraheme given -Continue to monitor CBC  Right-sided hydronephrosis -No obstructing lesions noted on CT: Right hydroureteronephrosis, no obstructing lesion or stone -Will continue to monitor creatinine closely  Multiple liver lesions -Noted on CT of the abdomen, patient will need outpatient follow-up and monitoring  Hypertension -Home medications held, continue IV hydralazine PRN  Hyperlipidemia -Home medications held due to NPO  Dementia -Home medications currently held due to NPO -Per daughter at bedside, patient has dementia, but is able to confabulate well  Leukocytosis -Resolved, Likely secondary to SBO -continue to monitor CBC -patient is currently  afebrile  Hypokalemia -Likely secondary to nausea and vomiting -K 3.3, will replace  -Continue to monitor BMP  -Magnesium 2.2  Headache -Continue pain control  Code Status: Full  Family Communication: Daughter at bedside  Disposition Plan: Admitted. Continues to have NGT. Pending further recommendations from surgery and pathology report.  Time Spent in minutes 30 minutes  Procedures  Right colectomy  Consults   General surgery  DVT Prophylaxis Heparin  Lab Results  Component Value Date   PLT 279 01/28/2015    Medications  Scheduled Meds: . bisacodyl  10 mg Rectal Daily  . heparin subcutaneous  5,000 Units Subcutaneous 3 times per day  . hydrocerin   Topical BID  . lip balm  1 application Topical BID  . metoprolol  5 mg Intravenous 4 times per day   Continuous Infusions: . dextrose 5 % and 0.45 % NaCl with KCl 20 mEq/L 50 mL/hr at 01/27/15 2248   PRN Meds:.acetaminophen, acetaminophen, alum & mag hydroxide-simeth, diphenhydrAMINE, HYDROcodone-acetaminophen, magic mouthwash, menthol-cetylpyridinium, ondansetron **OR** ondansetron (ZOFRAN) IV, ondansetron, phenol, promethazine  Antibiotics    Anti-infectives    Start     Dose/Rate Route Frequency Ordered Stop   01/24/15 0400  cefoTEtan (CEFOTAN) 2 g in dextrose 5 % 50 mL IVPB     2 g 100 mL/hr over 30 Minutes Intravenous Every 12 hours 01/23/15 2116 01/24/15 0455   01/23/15 1615  ceFAZolin (ANCEF) IVPB 2 g/50 mL premix     2 g 100 mL/hr over 30 Minutes Intravenous  Once 01/23/15 1614 01/23/15 1655       Objective:   Filed Vitals:   01/27/15 2128 01/27/15 2248 01/27/15 2328 01/28/15 0450  BP: 120/69  110/63 115/66  Pulse: 91 88 88 87  Temp: 98.1 F (36.7 C)   98.2 F (36.8 C)  TempSrc: Oral   Oral  Resp: 18   18  Height:      Weight:      SpO2: 100%   100%    Wt Readings from Last 3 Encounters:  01/23/15 47.174 kg (104 lb)  09/02/14 54.432 kg (120 lb)  03/03/14 56.7 kg (125 lb)     Intake/Output Summary (Last 24 hours) at 01/28/15 1124 Last data filed at 01/28/15 [id number]  Gross per 24 hour  Intake   1501 ml  Output    800 ml  Net    701 ml    Exam  General: Well developed, well nourished, NAD  HEENT: NCAT, mucous membranes moist. NGT in place  Cardiovascular: S1 S2 auscultated, RRR, no murmurs  Respiratory: Clear to auscultation   Abdomen: Soft, nontender, mildly distended, + bowel sounds, midline  dressing with VAC in place  Extremities: warm dry without cyanosis clubbing or edema  Neuro: AAOx2 (person and place, not time)  Nonfocal  Psych: Appropriate mood and affect, pleasant  Data Review   Micro Results No results found for this or any previous visit (from the past 240 hour(s)).  Radiology Reports Ct Abdomen Pelvis Wo Contrast  01/21/2015  CLINICAL DATA:  Abdominal distention for 4 days with nausea and vomiting. Decreased urine output. EXAM: CT ABDOMEN AND PELVIS WITHOUT CONTRAST TECHNIQUE: Multidetector CT imaging of the abdomen and pelvis was performed following the standard protocol without IV contrast. COMPARISON:  None. FINDINGS: Lower chest:  The included lung bases are clear. Liver: Ladder density lesion in the anterior subcapsular left lobe measures 3.6 cm and is consistent with simple cyst. Additional smaller hypodense lesions in the left and anterior  right lobe are incompletely characterized without contrast. Hepatobiliary: Gallbladder physiologically distended, displaced laterally by dilated stomach. No calcified gallstone. Pancreas: Normal.  No ductal dilatation or surrounding inflammation. Spleen: Small soft tissue density in the left upper quadrant, likely diminutive spleen. Adrenal glands: No nodule. Kidneys: Multiple bilateral low-density lesions in both kidneys consistent with cysts. Mild right hydroureteronephrosis, distal ureter is suboptimally defined. There is an nonobstructing stone in the lower right kidney. No ureteral calculi. No left hydronephrosis. Stomach/Bowel: Stomach markedly distended with fluid. Diffuse fluid-filled dilated small bowel, measuring up to 4.1 cm. Transition point suspected in the right lower quadrant, however no definite decompressed small bowel is seen. No pneumatosis. Small volume of stool throughout the colon without colonic wall thickening. The ileocecal valve not well-defined. The appendix is not definitively seen. Vascular/Lymphatic: No  retroperitoneal adenopathy. Abdominal aorta is normal in caliber. Dense atherosclerosis of the abdominal aorta and its branches. Reproductive: Uterus appears in situ, suboptimally defined due to fluid-filled adjacent bowel loops. Limited assessment for adnexal mass. Bladder: Minimally distended, irregular/tubular in shape. No wall thickening. Displaced posteriorly due to dilated small bowel. Other: Mild mesenteric edema centrally.  No free air or free fluid. Musculoskeletal: There are no acute or suspicious osseous abnormalities. Advanced facet arthropathy in the lumbar spine. Transitional lumbosacral anatomy. IMPRESSION: 1. High-grade small bowel obstruction with transition point in the right lower quadrant. No definite decompressed small bowel loops are seen, suspect distal ileum a site of obstruction. No perforation, pneumatosis or free fluid. 2. Right hydroureteronephrosis, however no obstructing lesion or stone. There is a nonobstructing stone in the lower right kidney. Multiple bilateral renal cysts. 3. Multiple liver lesions, largest represents a simple cyst. Smaller lesions are incompletely characterized without contrast. Electronically Signed   By: Jeb Levering M.D.   On: 01/21/2015 03:26   Ct Chest W Contrast  01/26/2015  CLINICAL DATA:  Hypoxia.  Bowel obstruction. EXAM: CT CHEST WITH CONTRAST TECHNIQUE: Multidetector CT imaging of the chest was performed during intravenous contrast administration. CONTRAST:  78mL OMNIPAQUE IOHEXOL 300 MG/ML  SOLN COMPARISON:  None FINDINGS: Mediastinum: The heart size appears normal. There is no pericardial effusion identified. The trachea is patent and is midline. There is a nasogastric tube within the esophagus. Tip is looped within the stomach. There is no mediastinal or hilar adenopathy. No enlarged axillary or supraclavicular lymph nodes. Lungs/Pleura: Small left pleural effusion identified. Moderate changes of centrilobular emphysema identified. There is a  discs shaped nodule within the right upper lobe which measures 5 mm, image 29 of series 603 and image 16 of series 15. Subsegmental atelectasis noted in the left base. Upper Abdomen: There is a low density structure in the lateral segment of left lobe measuring 7 mm. This is too small to characterize. Renal cysts noted. Musculoskeletal: Mild degenerative disc disease noted within the thoracic spine. IMPRESSION: 1. Small left pleural effusion. 2. Right upper lobe lung nodule measures 5 mm. If the patient is at high risk for bronchogenic carcinoma, follow-up chest CT at 6-12 months is recommended. If the patient is at low risk for bronchogenic carcinoma, follow-up chest CT at 12 months is recommended. This recommendation follows the consensus statement: Guidelines for Management of Small Pulmonary Nodules Detected on CT Scans: A Statement from the [place] as published in Radiology 2005;237:395-400. 3. Emphysema. Electronically Signed   By: Kerby Moors M.D.   On: 01/26/2015 15:51   Dg Abd Portable 1v  01/23/2015  CLINICAL DATA:  Small bowel obstruction EXAM: PORTABLE ABDOMEN -  1 VIEW COMPARISON:  Abdominal series of January 22, 2015 FINDINGS: This single supine abdominal film reveals several moderately distended air-filled and fluid-filled bowel loops. This is predominantly small bowel. Contrast with things will within these loops is present from the CT scan of 3 days ago. There is a small amount of gas within portions of the colon. There is no definite rectal gas. The nasogastric tube tip lies in the pylorus or proximal duodenum with the proximal port in the pre-pyloric region. No free extraluminal gas is demonstrated. IMPRESSION: Persistent distal small bowel obstruction. There has not been significant progression of contrast into the colon since the abdominal CT scan of 15 November. Electronically Signed   By: David  Martinique M.D.   On: 01/23/2015 07:09   Dg Abd Portable 1v  01/22/2015  CLINICAL  DATA:  Nausea, vomiting, abdominal distension, small bowel obstruction, hypertension, dementia, GERD EXAM: PORTABLE ABDOMEN - 1 VIEW COMPARISON:  01/21/2015 FINDINGS: Contrast opacifies multiple dilated small bowel loops. Persistent small bowel dilatation unchanged previous exam. Nasogastric tube coiled in stomach with tip projecting over pylorus or proximal duodenum. No definite bowel wall thickening. No significant colonic gas or stool. Bones demineralized. IMPRESSION: Persistent small bowel dilatation compatible with obstruction. Electronically Signed   By: Lavonia Dana M.D.   On: 01/22/2015 07:51   Dg Abd Portable 1v-small Bowel Obstruction Protocol-initial, 8 Hr Delay  01/21/2015  CLINICAL DATA:  8 hour delay film EXAM: PORTABLE ABDOMEN - 1 VIEW COMPARISON:  Earlier film of the same day FINDINGS: Nasogastric tube tip probably just beyond the pylorus. Multiple dilated small bowel loops as before. Some enteral contrast is now evident, predominantly in dilated small bowel loops. The colon appears decompressed. Degenerative changes in the lower lumbar spine. IMPRESSION: 1. Stable nasogastric tube into the proximal duodenum, with little change in appearance of small bowel obstruction. Electronically Signed   By: Lucrezia Europe M.D.   On: 01/21/2015 17:39   Dg Abd Portable 1v-small Bowel Protocol-position Verification  01/21/2015  CLINICAL DATA:  Check nasogastric catheter placement EXAM: PORTABLE ABDOMEN - 1 VIEW COMPARISON:  01/20/2015 FINDINGS: A nasogastric catheter is noted coiled within the stomach. The tip is directed towards the pyloric channel. The stomach has been decompressed when compared with prior exam. The small bowel remains dilated similar to that seen on recent CT examination. Degenerative changes of lumbar spine are seen. IMPRESSION: Stable small bowel dilatation. Nasogastric catheter within the stomach. Electronically Signed   By: Inez Catalina M.D.   On: 01/21/2015 08:38    CBC  Recent  Labs Lab 01/24/15 0410 01/25/15 0436 01/26/15 0435 01/27/15 0406 01/28/15 0438  WBC 10.5 10.0 8.9 7.8 6.6  HGB 11.3* 10.2* 10.3* 10.7* 11.0*  HCT 33.0* 30.1* 30.1* 31.4* 32.4*  PLT 251 225 228 254 279  MCV 86.6 86.7 86.7 85.8 86.2  MCH 29.7 29.4 29.7 29.2 29.3  MCHC 34.2 33.9 34.2 34.1 34.0  RDW 15.2 15.3 15.2 15.1 15.2    Chemistries   Recent Labs Lab 01/22/15 0350  01/24/15 0410 01/25/15 0436 01/26/15 0435 01/27/15 0406 01/28/15 0438  NA 136  < > 137 138 137 136 135  K 3.2*  < > 4.0 3.8 3.7 3.3* 3.9  CL 97*  < > 102 107 107 103 106  CO2 25  < > 24 24 24 25 23   GLUCOSE 85  < > 190* 136* 110* 109* 106*  BUN 52*  < > 30* 16 9 8 7   CREATININE 1.56*  < >  1.18* 0.97 0.83 0.75 0.78  CALCIUM 8.4*  < > 7.9* 7.8* 7.7* 8.2* 8.3*  MG 2.2  --   --   --  1.3* 1.7  --   < > = values in this interval not displayed. ------------------------------------------------------------------------------------------------------------------ estimated creatinine clearance is 47.4 mL/min (by C-G formula based on Cr of 0.78). ------------------------------------------------------------------------------------------------------------------ No results for input(s): HGBA1C in the last 72 hours. ------------------------------------------------------------------------------------------------------------------ No results for input(s): CHOL, HDL, LDLCALC, TRIG, CHOLHDL, LDLDIRECT in the last 72 hours. ------------------------------------------------------------------------------------------------------------------ No results for input(s): TSH, T4TOTAL, T3FREE, THYROIDAB in the last 72 hours.  Invalid input(s): FREET3 ------------------------------------------------------------------------------------------------------------------  Recent Labs  01/26/15 0435  VITAMINB12 2280*  FOLATE 11.6  FERRITIN 222  TIBC 140*  IRON 19*  RETICCTPCT 1.1    Coagulation profile No results for input(s): INR,  PROTIME in the last 168 hours.  No results for input(s): DDIMER in the last 72 hours.  Cardiac Enzymes No results for input(s): CKMB, TROPONINI, MYOGLOBIN in the last 168 hours.  Invalid input(s): CK ------------------------------------------------------------------------------------------------------------------ Invalid input(s): POCBNP    Jalaiyah Throgmorton A MD. on 01/28/2015 at 11:24 AM  Between 7am to 7pm - Pager - [phone]  After 7pm go to [email or web address] - password [email or web address]  And look for the night coverage person covering for me after hours  [facility] Office  [phone]

## 2015-01-28 NOTE — Progress Notes (Signed)
No residual noted from NGT.

## 2015-01-28 NOTE — Plan of Care (Signed)
Problem: Bowel/Gastric: Goal: Will not experience complications related to bowel motility Outcome: Progressing Patient having stools and tolerating clears without n/v.

## 2015-01-28 NOTE — Plan of Care (Signed)
Problem: Safety: Goal: Ability to remain free from injury will improve Outcome: Progressing Bed alarm on due to patient's hx of dementia. Patient has not been pulling at lines so no need for safety mittons.

## 2015-01-29 MED ORDER — POLYETHYLENE GLYCOL 3350 17 G PO PACK
17.0000 g | PACK | Freq: Every day | ORAL | Status: DC
  Administered 2015-01-29 – 2015-01-30 (×2): 17 g via ORAL
  Filled 2015-01-29 (×2): qty 1

## 2015-01-29 NOTE — Progress Notes (Signed)
Patient ID: Beth Wilkins, female   DOB: [birth year], 74 y.o.   MRN: [id number] [facility] Surgery Progress Note  6 Days Post-Op  Subjective: Pt continues to have good pain control and no n/v.  Having flatus and stools.    Objective: Vital signs in last 24 hours: Temp:  [98.2 F (36.8 C)-98.5 F (36.9 C)] 98.5 F (36.9 C) (11/24 0517) Pulse Rate:  [80-96] 80 (11/24 0517) Resp:  [16-18] 18 (11/24 0517) BP: (106-133)/(56-71) 106/56 mmHg (11/24 0517) SpO2:  [100 %] 100 % (11/24 0517) Last BM Date: 01/28/15  Intake/Output from previous day:   Intake/Output this shift:    PE: Gen:  Alert, NAD, pleasant Abd: Soft, NT, ND,  incisional wound vac in place  Lab Results:   Recent Labs  01/27/15 0406 01/28/15 0438  WBC 7.8 6.6  HGB 10.7* 11.0*  HCT 31.4* 32.4*  PLT 254 279   BMET  Recent Labs  01/27/15 0406 01/28/15 0438  NA 136 135  K 3.3* 3.9  CL 103 106  CO2 25 23  GLUCOSE 109* 106*  BUN 8 7  CREATININE 0.75 0.78  CALCIUM 8.2* 8.3*   PT/INR No results for input(s): LABPROT, INR in the last 72 hours. CMP     Component Value Date/Time   NA 135 01/28/2015 0438   K 3.9 01/28/2015 0438   CL 106 01/28/2015 0438   CO2 23 01/28/2015 0438   GLUCOSE 106* 01/28/2015 0438   BUN 7 01/28/2015 0438   CREATININE 0.78 01/28/2015 0438   CALCIUM 8.3* 01/28/2015 0438   PROT 6.9 01/21/2015 0745   ALBUMIN 3.9 01/21/2015 0745   AST 25 01/21/2015 0745   ALT 22 01/21/2015 0745   ALKPHOS 66 01/21/2015 0745   BILITOT 1.4* 01/21/2015 0745   GFRNONAA >60 01/28/2015 0438   GFRAA >60 01/28/2015 0438   Lipase     Component Value Date/Time   LIPASE 55* 01/20/2015 2247       Studies/Results: No results found.  Anti-infectives: Anti-infectives    Start     Dose/Rate Route Frequency Ordered Stop   01/24/15 0400  cefoTEtan (CEFOTAN) 2 g in dextrose 5 % 50 mL IVPB     2 g 100 mL/hr over 30 Minutes Intravenous Every 12 hours 01/23/15 2116 01/24/15 0455   01/23/15  1615  ceFAZolin (ANCEF) IVPB 2 g/50 mL premix     2 g 100 mL/hr over 30 Minutes Intravenous  Once 01/23/15 1614 01/23/15 1655     Pathology report: Colon, segmental resection, with terminal ileum and cecum - INVASIVE ADENOCARCINOMA EXTENDING INTO PERICECAL CONNECTIVE TISSUE AND INVOLVING SMALL INTESTINE. - THREE BENIGN LYMPH NODES (0/3). - Margins are free of tumor - Staging T4N0 - There is a colorectal-type adenocarcinoma arising in the cecum in the area of the appendiceal orifice. The tumor extends into the pericecal connective tissue, and involves the attached segment of terminal ileum. - Immunohistochemistry shows the tumor is positive with CDX2 and cytokeratin 20, and negative with cytokeratin 7, estrogen receptor and WT-1 supporting a diagnosis of primary colorectal adenocarcinoma.   Assessment/Plan High grade SBO secondary to Invasive adenocarcinoma of cecum, appendix, ileum POD #6 s/p Diagnostic laparoscopy, Exploratory lap, Right hemi-colectomy for obstruction of right terminal ileum -Patient having clears instead of fulls.  Advance to fulls and regular diet tonight if tolerates.   -IS and Ambulate -Has incisional wound vac which can stay on up to 7 days, d/c at discharge, staples to be removed POD #10 -No need for  antibiotics at this time -CEA - 5.7 (mild elevation) -PT/OT said no follow up needed, 24 hour assistance -Oral pain meds -Discussed results with the patient and her daughter.  They have a f/u appt with Dr. Burr Medico from Oncology next week. -Plan to D/c home Friday if tolerating solid diet   Leukocytosis - resolved Acute renal failure -resolved Right-sided hydronephrosis Multiple liver lesions HTN HLD Dementia DVT Proph - SCDs, SQ heparin    LOS: 8 days    Marisol Glazer 01/29/2015, 8:15 AM

## 2015-01-29 NOTE — Progress Notes (Signed)
[facility]                                                                              Patient Demographics  Beth Wilkins, is a 74 y.o. female, DOB - [birth year], [id number]  Admit date - 01/20/2015   Admitting Physician Rise Patience, MD  Outpatient Primary MD for the patient is [name], MD  LOS - 8  Subjective:   Seen with daughter at bedside. Reported another BM yesterday afternoon. Diet advanced to regular, if she tolerates it, can be discharged in a.m.  Chief Complaint  Patient presents with  . Dehydration      HPI on 01/21/2015 by Dr. Gean Birchwood Beth Wilkins is a 74 y.o. female with history of hypertension, hyperlipidemia, dementia was brought to the ER after patient has been having multiple episodes of nausea vomiting. As per patient's daughter who provided the history patient has been having decreased appetite and abdominal distention for almost 2-3 weeks now. Over the last 2-3 days patient has been having projectile vomiting which has not improved and patient was brought to the ER. CT abdomen and pelvis shows high-grade small bowel obstruction and on-call surgeon Dr. Excell Seltzer was consulted by the ER physician and patient has been admitted for further management. Patient has been placed on NG tube suction following which large amounts of fluid has been suctioned. As per the daughter patient has not complained of any abdominal pain as such. Patient does not recall her last bowel movement. Denies any chest pain or shortness of breath. Has not had any previous abdominal surgery as per the patient's daughter.   Interim history Patient found to have SBO secondary to chronic perf appendix.  Pathology showed invasive adenocarcinoma.    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 -If she tolerates it can be discharged in a.m. Continue ambulation in the [place].  Adenocarcinoma Involving the cecum, appendix and terminal ileum and involving the perirectal fat. Patient follow-up with oncology as outpatient primary is likely the colon.  Acute renal failure -Resolved. Upon admission, was 2.37  -Likely secondary to dehydration and poor oral intake -Creatinine improving, currently 0.75  Normocytic anemia -Hb 10.7, baseline 11 -Anemia panel: Iron 19,  Ferritin 22  -One dose of feraheme given -Continue to monitor CBC  Right-sided hydronephrosis -No obstructing lesions noted on CT: Right hydroureteronephrosis, no obstructing lesion or stone -Will continue to monitor creatinine closely  Multiple liver lesions -Noted on CT of the abdomen, patient will need outpatient follow-up and monitoring  Hypertension -Home medications held, continue IV hydralazine PRN  Hyperlipidemia -Home medications held due to NPO  Dementia -Home medications currently held due to NPO -Per daughter at bedside, patient has dementia, but is able to confabulate well  Leukocytosis -Resolved, Likely secondary to SBO -continue to monitor CBC -patient is currently afebrile  Hypokalemia -Likely secondary to nausea and vomiting -K 3.3, will replace  -Continue to monitor BMP  -Magnesium 2.2  Headache -Continue pain control  Code Status: Full  Family Communication: Daughter at bedside  Disposition Plan: Admitted. Continues to have NGT. Pending further recommendations from surgery and pathology report.  Time Spent in minutes 30 minutes  Procedures  Right colectomy  Consults  General surgery  DVT Prophylaxis  Heparin  Lab Results  Component Value Date   PLT 279 01/28/2015    Medications  Scheduled Meds: . bisacodyl  10 mg Rectal Daily  . heparin subcutaneous  5,000 Units Subcutaneous 3 times per day  . hydrocerin   Topical BID  . lip balm  1 application Topical BID  . metoprolol  5 mg Intravenous 4 times per day   Continuous Infusions: . dextrose 5 % and 0.45 % NaCl with KCl 20 mEq/L 50 mL/hr at 01/27/15 2248   PRN Meds:.acetaminophen, acetaminophen, alum & mag hydroxide-simeth, diphenhydrAMINE, HYDROcodone-acetaminophen, magic mouthwash, menthol-cetylpyridinium, ondansetron **OR** ondansetron (ZOFRAN) IV, ondansetron, phenol, promethazine  Antibiotics    Anti-infectives    Start     Dose/Rate Route Frequency Ordered Stop   01/24/15 0400  cefoTEtan (CEFOTAN) 2 g in dextrose 5 % 50 mL IVPB     2 g 100 mL/hr over 30 Minutes Intravenous Every 12 hours 01/23/15 2116 01/24/15 0455   01/23/15 1615  ceFAZolin (ANCEF) IVPB 2 g/50 mL premix     2 g 100 mL/hr over 30 Minutes Intravenous  Once 01/23/15 1614 01/23/15 1655       Objective:   Filed Vitals:   01/28/15 1159 01/28/15 1608 01/28/15 2026 01/29/15 0517  BP: 124/71 133/71 122/61 106/56  Pulse: 87 96 85 80  Temp:  98.3 F (36.8 C) 98.2 F (36.8 C) 98.5 F (36.9 C)  TempSrc:  Oral Oral Oral  Resp:  16 18 18   Height:      Weight:      SpO2:  100% 100% 100%    Wt Readings from Last 3 Encounters:  01/23/15 47.174 kg (104 lb)  09/02/14 54.432 kg (120 lb)  03/03/14 56.7 kg (125 lb)     Intake/Output Summary (Last 24 hours) at 01/29/15 1141 Last data filed at 01/29/15 0900  Gross per 24 hour  Intake   1705 ml  Output      0 ml  Net   1705 ml    Exam  General: Well developed, well nourished, NAD  HEENT: NCAT, mucous membranes moist. NGT in place  Cardiovascular: S1 S2 auscultated, RRR, no murmurs  Respiratory: Clear to auscultation   Abdomen: Soft, nontender, mildly distended, + bowel sounds, midline  dressing with VAC in place  Extremities: warm dry without cyanosis clubbing or edema  Neuro: AAOx2 (person and place, not time)  Nonfocal  Psych: Appropriate mood and affect, pleasant  Data Review   Micro Results No results found for this or any previous visit (from the past 240 hour(s)).  Radiology Reports Ct Abdomen Pelvis Wo Contrast  01/21/2015  CLINICAL DATA:  Abdominal distention for 4 days with nausea and vomiting. Decreased urine output. EXAM: CT ABDOMEN AND PELVIS WITHOUT CONTRAST TECHNIQUE: Multidetector CT imaging of the abdomen and pelvis was performed following the standard protocol without IV contrast. COMPARISON:  None. FINDINGS: Lower chest:  The included lung bases are clear. Liver: Ladder density lesion in the anterior subcapsular left lobe measures 3.6 cm and is consistent with simple cyst. Additional smaller hypodense lesions in the left  and anterior right lobe are incompletely characterized without contrast. Hepatobiliary: Gallbladder physiologically distended, displaced laterally by dilated stomach. No calcified gallstone. Pancreas: Normal.  No ductal dilatation or surrounding inflammation. Spleen: Small soft tissue density in the left upper quadrant, likely diminutive spleen. Adrenal glands: No nodule. Kidneys: Multiple bilateral low-density lesions in both kidneys consistent with cysts. Mild right hydroureteronephrosis, distal ureter is suboptimally defined. There is an nonobstructing stone in the lower right kidney. No ureteral calculi. No left hydronephrosis. Stomach/Bowel: Stomach markedly distended with fluid. Diffuse fluid-filled dilated small bowel, measuring up to 4.1 cm. Transition point suspected in the right lower quadrant, however no definite decompressed small bowel is seen. No pneumatosis. Small volume of stool throughout the colon without colonic wall thickening. The ileocecal valve not well-defined. The appendix is not definitively seen. Vascular/Lymphatic: No  retroperitoneal adenopathy. Abdominal aorta is normal in caliber. Dense atherosclerosis of the abdominal aorta and its branches. Reproductive: Uterus appears in situ, suboptimally defined due to fluid-filled adjacent bowel loops. Limited assessment for adnexal mass. Bladder: Minimally distended, irregular/tubular in shape. No wall thickening. Displaced posteriorly due to dilated small bowel. Other: Mild mesenteric edema centrally.  No free air or free fluid. Musculoskeletal: There are no acute or suspicious osseous abnormalities. Advanced facet arthropathy in the lumbar spine. Transitional lumbosacral anatomy. IMPRESSION: 1. High-grade small bowel obstruction with transition point in the right lower quadrant. No definite decompressed small bowel loops are seen, suspect distal ileum a site of obstruction. No perforation, pneumatosis or free fluid. 2. Right hydroureteronephrosis, however no obstructing lesion or stone. There is a nonobstructing stone in the lower right kidney. Multiple bilateral renal cysts. 3. Multiple liver lesions, largest represents a simple cyst. Smaller lesions are incompletely characterized without contrast. Electronically Signed   By: Jeb Levering M.D.   On: 01/21/2015 03:26   Ct Chest W Contrast  01/26/2015  CLINICAL DATA:  Hypoxia.  Bowel obstruction. EXAM: CT CHEST WITH CONTRAST TECHNIQUE: Multidetector CT imaging of the chest was performed during intravenous contrast administration. CONTRAST:  38mL OMNIPAQUE IOHEXOL 300 MG/ML  SOLN COMPARISON:  None FINDINGS: Mediastinum: The heart size appears normal. There is no pericardial effusion identified. The trachea is patent and is midline. There is a nasogastric tube within the esophagus. Tip is looped within the stomach. There is no mediastinal or hilar adenopathy. No enlarged axillary or supraclavicular lymph nodes. Lungs/Pleura: Small left pleural effusion identified. Moderate changes of centrilobular emphysema identified. There is a  discs shaped nodule within the right upper lobe which measures 5 mm, image 29 of series 603 and image 16 of series 15. Subsegmental atelectasis noted in the left base. Upper Abdomen: There is a low density structure in the lateral segment of left lobe measuring 7 mm. This is too small to characterize. Renal cysts noted. Musculoskeletal: Mild degenerative disc disease noted within the thoracic spine. IMPRESSION: 1. Small left pleural effusion. 2. Right upper lobe lung nodule measures 5 mm. If the patient is at high risk for bronchogenic carcinoma, follow-up chest CT at 6-12 months is recommended. If the patient is at low risk for bronchogenic carcinoma, follow-up chest CT at 12 months is recommended. This recommendation follows the consensus statement: Guidelines for Management of Small Pulmonary Nodules Detected on CT Scans: A Statement from the [place] as published in Radiology 2005;237:395-400. 3. Emphysema. Electronically Signed   By: Kerby Moors M.D.   On: 01/26/2015 15:51   Dg Abd Portable 1v  01/23/2015  CLINICAL DATA:  Small bowel obstruction EXAM: PORTABLE  ABDOMEN - 1 VIEW COMPARISON:  Abdominal series of January 22, 2015 FINDINGS: This single supine abdominal film reveals several moderately distended air-filled and fluid-filled bowel loops. This is predominantly small bowel. Contrast with things will within these loops is present from the CT scan of 3 days ago. There is a small amount of gas within portions of the colon. There is no definite rectal gas. The nasogastric tube tip lies in the pylorus or proximal duodenum with the proximal port in the pre-pyloric region. No free extraluminal gas is demonstrated. IMPRESSION: Persistent distal small bowel obstruction. There has not been significant progression of contrast into the colon since the abdominal CT scan of 15 November. Electronically Signed   By: David  Martinique M.D.   On: 01/23/2015 07:09   Dg Abd Portable 1v  01/22/2015  CLINICAL  DATA:  Nausea, vomiting, abdominal distension, small bowel obstruction, hypertension, dementia, GERD EXAM: PORTABLE ABDOMEN - 1 VIEW COMPARISON:  01/21/2015 FINDINGS: Contrast opacifies multiple dilated small bowel loops. Persistent small bowel dilatation unchanged previous exam. Nasogastric tube coiled in stomach with tip projecting over pylorus or proximal duodenum. No definite bowel wall thickening. No significant colonic gas or stool. Bones demineralized. IMPRESSION: Persistent small bowel dilatation compatible with obstruction. Electronically Signed   By: Lavonia Dana M.D.   On: 01/22/2015 07:51   Dg Abd Portable 1v-small Bowel Obstruction Protocol-initial, 8 Hr Delay  01/21/2015  CLINICAL DATA:  8 hour delay film EXAM: PORTABLE ABDOMEN - 1 VIEW COMPARISON:  Earlier film of the same day FINDINGS: Nasogastric tube tip probably just beyond the pylorus. Multiple dilated small bowel loops as before. Some enteral contrast is now evident, predominantly in dilated small bowel loops. The colon appears decompressed. Degenerative changes in the lower lumbar spine. IMPRESSION: 1. Stable nasogastric tube into the proximal duodenum, with little change in appearance of small bowel obstruction. Electronically Signed   By: Lucrezia Europe M.D.   On: 01/21/2015 17:39   Dg Abd Portable 1v-small Bowel Protocol-position Verification  01/21/2015  CLINICAL DATA:  Check nasogastric catheter placement EXAM: PORTABLE ABDOMEN - 1 VIEW COMPARISON:  01/20/2015 FINDINGS: A nasogastric catheter is noted coiled within the stomach. The tip is directed towards the pyloric channel. The stomach has been decompressed when compared with prior exam. The small bowel remains dilated similar to that seen on recent CT examination. Degenerative changes of lumbar spine are seen. IMPRESSION: Stable small bowel dilatation. Nasogastric catheter within the stomach. Electronically Signed   By: Inez Catalina M.D.   On: 01/21/2015 08:38    CBC  Recent  Labs Lab 01/24/15 0410 01/25/15 0436 01/26/15 0435 01/27/15 0406 01/28/15 0438  WBC 10.5 10.0 8.9 7.8 6.6  HGB 11.3* 10.2* 10.3* 10.7* 11.0*  HCT 33.0* 30.1* 30.1* 31.4* 32.4*  PLT 251 225 228 254 279  MCV 86.6 86.7 86.7 85.8 86.2  MCH 29.7 29.4 29.7 29.2 29.3  MCHC 34.2 33.9 34.2 34.1 34.0  RDW 15.2 15.3 15.2 15.1 15.2    Chemistries   Recent Labs Lab 01/24/15 0410 01/25/15 0436 01/26/15 0435 01/27/15 0406 01/28/15 0438  NA 137 138 137 136 135  K 4.0 3.8 3.7 3.3* 3.9  CL 102 107 107 103 106  CO2 24 24 24 25 23   GLUCOSE 190* 136* 110* 109* 106*  BUN 30* 16 9 8 7   CREATININE 1.18* 0.97 0.83 0.75 0.78  CALCIUM 7.9* 7.8* 7.7* 8.2* 8.3*  MG  --   --  1.3* 1.7  --    ------------------------------------------------------------------------------------------------------------------ estimated  creatinine clearance is 47.4 mL/min (by C-G formula based on Cr of 0.78). ------------------------------------------------------------------------------------------------------------------ No results for input(s): HGBA1C in the last 72 hours. ------------------------------------------------------------------------------------------------------------------ No results for input(s): CHOL, HDL, LDLCALC, TRIG, CHOLHDL, LDLDIRECT in the last 72 hours. ------------------------------------------------------------------------------------------------------------------ No results for input(s): TSH, T4TOTAL, T3FREE, THYROIDAB in the last 72 hours.  Invalid input(s): FREET3 ------------------------------------------------------------------------------------------------------------------ No results for input(s): VITAMINB12, FOLATE, FERRITIN, TIBC, IRON, RETICCTPCT in the last 72 hours.  Coagulation profile No results for input(s): INR, PROTIME in the last 168 hours.  No results for input(s): DDIMER in the last 72 hours.  Cardiac Enzymes No results for input(s): CKMB, TROPONINI, MYOGLOBIN in the  last 168 hours.  Invalid input(s): CK ------------------------------------------------------------------------------------------------------------------ Invalid input(s): POCBNP    Josselyne Onofrio A MD. on 01/29/2015 at 11:41 AM  Between 7am to 7pm - Pager - [phone]  After 7pm go to [email or web address] - password [email or web address]  And look for the night coverage person covering for me after hours  [facility] Office  [phone]

## 2015-01-30 MED ORDER — POLYETHYLENE GLYCOL 3350 17 G PO PACK: 17.0000 g | PACK | Freq: Every day | ORAL | Status: AC

## 2015-01-30 NOTE — Progress Notes (Signed)
Discharge instructions explained to pt's daughter and pt and prescription given to daughter. Pt ambulates well with standby assist. Sitting up in chair for meals. Appetite fair. Pt discharge to daughter whom she lives with.

## 2015-01-30 NOTE — Discharge Summary (Signed)
Physician Discharge Summary  ETHELYN Wilkins [id number] DOB: 05-Feb-1941 DOA: 01/20/2015  PCP: [name], MD  Admit date: 01/20/2015 Discharge date: 01/30/2015  Time spent: 40 minutes  Recommendations for Outpatient Follow-up:  1. Follow-up with general surgery as outpatient. 2. Follow-up with Dr. Burr Medico of the regional [facility] on 02/05/2015.   Discharge Diagnoses:  Principal Problem:   Perforated appendix s/p right colectomy 01/24/2015 Active Problems:   HTN (hypertension)   Moderate dementia without behavioral disturbance   SBO (small bowel obstruction) (HCC)   ARF (acute renal failure) (HCC)   Hypokalemia   Leukocytosis   Absolute anemia   Discharge Condition: Stable  Diet recommendation: Heart healthy  Filed Weights   01/22/15 0605 01/23/15 0440 01/23/15 1129  Weight: 46.72 kg (103 lb) 41.005 kg (90 lb 6.4 oz) 47.174 kg (104 lb)    History of present illness:  Beth Wilkins is a 74 y.o. female with history of hypertension, hyperlipidemia, dementia was brought to the ER after patient has been having multiple episodes of nausea vomiting. As per patient's daughter who provided the history patient has been having decreased appetite and abdominal distention for almost 2-3 weeks now. Over the last 2-3 days patient has been having projectile vomiting which has not improved and patient was brought to the ER. CT abdomen and pelvis shows high-grade small bowel obstruction and on-call surgeon Dr. Excell Seltzer was consulted by the ER physician and patient has been admitted for further management. Patient has been placed on NG tube suction following which large amounts of fluid has been suctioned. As per the daughter patient has not complained of any abdominal pain as such. Patient does not recall her last bowel movement. Denies any chest pain or shortness of breath. Has not had any previous abdominal surgery as per the patient's daughter.   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 -Started on IV Cefotetan, however discontinued -General surgery consulted and appreciated -Small bowel protocol did not show improvement in obstruction -Undergone right-sided hemicolectomy for obstruction of the right terminal ileum. -Tolerating diet afterwards and having good bowel movements. -Discharged okay with Dr. Barry Dienes.  Adenocarcinoma -Involving the cecum, appendix and terminal ileum and involving the perirectal fat. CEA is 5.7. -Patient follow-up with oncology as outpatient primary is likely the colon. Oncology follow-up on 02/05/2015. -Patient and daughter aware about the cancer follow-up.  Acute renal failure -Resolved. Upon admission, was 2.37  -Likely secondary to dehydration and poor oral intake -Creatinine improving, currently 0.75  Normocytic anemia -Hb 10.7, baseline 11 -Anemia panel: Iron 19, Ferritin 22  -One dose of feraheme given -Continue to monitor CBC  Right-sided hydronephrosis -No obstructing lesions noted on CT: Right hydroureteronephrosis, no obstructing lesion or stone. -This could be secondary to pressure from outside appendicular mass, likely to be resolved after surgery. -Renal function is back to normal, this is needs follow-up on future imaging.  Multiple liver lesions -Noted on CT of the abdomen, 3.6 cm lesion likely to represent a cyst, incompletely evaluation because of lack of contrast.  Hypertension -Home medications held, continue IV hydralazine PRN  Hyperlipidemia -Home medications held due to NPO  Dementia -Patient has dementia, she has tendency to confabulate, daughter always at bedside.  Leukocytosis -Resolved, Likely secondary to SBO  Hypokalemia -Likely secondary to nausea and vomiting.   Procedures:  Right-sided colectomy done  by Dr. Hassell Done on  01/23/2015.  Consultations:  Gen. surgery.  Discharge Exam: Filed Vitals:   01/29/15 2119 01/30/15 0505  BP: 102/53 104/45  Pulse: 81 80  Temp: 98.4 F (36.9 C) 98 F (36.7 C)  Resp: 16 16    General: Alert and awake, oriented x3, not in any acute distress. HEENT: anicteric sclera, pupils reactive to light and accommodation, EOMI CVS: S1-S2 clear, no murmur rubs or gallops Chest: clear to auscultation bilaterally, no wheezing, rales or rhonchi Abdomen: soft nontender, nondistended, normal bowel sounds, no organomegaly Extremities: no cyanosis, clubbing or edema noted bilaterally Neuro: Cranial nerves II-XII intact, no focal neurological deficits  Discharge Instructions   Discharge Instructions    Diet - low sodium heart healthy    Complete by:  As directed      Increase activity slowly    Complete by:  As directed           Current Discharge Medication List    START taking these medications   Details  polyethylene glycol (MIRALAX / GLYCOLAX) packet Take 17 g by mouth daily. Qty: 14 each, Refills: 0      CONTINUE these medications which have NOT CHANGED   Details  esomeprazole (NEXIUM) 40 MG capsule Take 40 mg by mouth daily at 12 noon.    potassium chloride SA (K-DUR,KLOR-CON) 20 MEQ tablet Take 20 mEq by mouth daily.    simvastatin (ZOCOR) 20 MG tablet Take 20 mg by mouth daily at 6 PM.    triamterene-hydrochlorothiazide (MAXZIDE-25) 37.5-25 MG per tablet Take 1 tablet by mouth daily.    acetaminophen (TYLENOL) 325 MG tablet Take 2 tablets (650 mg total) by mouth every 6 (six) hours as needed for mild pain (or Fever >/= 101).    Cholecalciferol (VITAMIN D3) 2000 UNITS TABS Take by mouth daily.    Coenzyme Q10 (CO Q 10) 100 MG CAPS Take by mouth daily.    donepezil (ARICEPT) 10 MG tablet Take 1 tab daily Qty: 30 tablet, Refills: 11   Associated Diagnoses: Memory loss; Moderate dementia without behavioral disturbance    Ginkgo Biloba  (GINKOBA PO) Take 120 mg by mouth daily.    MULTIPLE VITAMIN PO Take by mouth daily.    traMADol (ULTRAM) 50 MG tablet Take 1 tablet (50 mg total) by mouth every 6 (six) hours as needed for severe pain. Qty: 30 tablet    vitamin B-12 (CYANOCOBALAMIN) 1000 MCG tablet Take 1,000 mcg by mouth daily.       Allergies  Allergen Reactions  . Namenda [Memantine Hcl] Other (See Comments)    hallucinations  . Oxycodone Other (See Comments)    Hallucinations    Follow-up Information    Follow up with [name], MD In 1 week.   Specialty:  Family Medicine   Contact information:   [place] [address] [phone]       Follow up with [name], MD On 02/05/2015.   Specialties:  Hematology, Oncology   Why:  @11 :30   Contact information:   [place] [address] [phone]        The results of significant diagnostics from this hospitalization (including imaging, microbiology, ancillary and laboratory) are listed below for reference.    Significant Diagnostic Studies: Ct Abdomen Pelvis Wo Contrast  01/21/2015  CLINICAL DATA:  Abdominal distention for 4 days with nausea and vomiting. Decreased urine output. EXAM: CT ABDOMEN AND PELVIS WITHOUT CONTRAST TECHNIQUE: Multidetector CT imaging of the abdomen and pelvis was performed  following the standard protocol without IV contrast. COMPARISON:  None. FINDINGS: Lower chest:  The included lung bases are clear. Liver: Ladder density lesion in the anterior subcapsular left lobe measures 3.6 cm and is consistent with simple cyst. Additional smaller hypodense lesions in the left and anterior right lobe are incompletely characterized without contrast. Hepatobiliary: Gallbladder physiologically distended, displaced laterally by dilated stomach. No calcified gallstone. Pancreas: Normal.  No ductal dilatation or surrounding inflammation. Spleen: Small soft tissue density in the left upper quadrant,  likely diminutive spleen. Adrenal glands: No nodule. Kidneys: Multiple bilateral low-density lesions in both kidneys consistent with cysts. Mild right hydroureteronephrosis, distal ureter is suboptimally defined. There is an nonobstructing stone in the lower right kidney. No ureteral calculi. No left hydronephrosis. Stomach/Bowel: Stomach markedly distended with fluid. Diffuse fluid-filled dilated small bowel, measuring up to 4.1 cm. Transition point suspected in the right lower quadrant, however no definite decompressed small bowel is seen. No pneumatosis. Small volume of stool throughout the colon without colonic wall thickening. The ileocecal valve not well-defined. The appendix is not definitively seen. Vascular/Lymphatic: No retroperitoneal adenopathy. Abdominal aorta is normal in caliber. Dense atherosclerosis of the abdominal aorta and its branches. Reproductive: Uterus appears in situ, suboptimally defined due to fluid-filled adjacent bowel loops. Limited assessment for adnexal mass. Bladder: Minimally distended, irregular/tubular in shape. No wall thickening. Displaced posteriorly due to dilated small bowel. Other: Mild mesenteric edema centrally.  No free air or free fluid. Musculoskeletal: There are no acute or suspicious osseous abnormalities. Advanced facet arthropathy in the lumbar spine. Transitional lumbosacral anatomy. IMPRESSION: 1. High-grade small bowel obstruction with transition point in the right lower quadrant. No definite decompressed small bowel loops are seen, suspect distal ileum a site of obstruction. No perforation, pneumatosis or free fluid. 2. Right hydroureteronephrosis, however no obstructing lesion or stone. There is a nonobstructing stone in the lower right kidney. Multiple bilateral renal cysts. 3. Multiple liver lesions, largest represents a simple cyst. Smaller lesions are incompletely characterized without contrast. Electronically Signed   By: Jeb Levering M.D.   On:  01/21/2015 03:26   Ct Chest W Contrast  01/26/2015  CLINICAL DATA:  Hypoxia.  Bowel obstruction. EXAM: CT CHEST WITH CONTRAST TECHNIQUE: Multidetector CT imaging of the chest was performed during intravenous contrast administration. CONTRAST:  75mL OMNIPAQUE IOHEXOL 300 MG/ML  SOLN COMPARISON:  None FINDINGS: Mediastinum: The heart size appears normal. There is no pericardial effusion identified. The trachea is patent and is midline. There is a nasogastric tube within the esophagus. Tip is looped within the stomach. There is no mediastinal or hilar adenopathy. No enlarged axillary or supraclavicular lymph nodes. Lungs/Pleura: Small left pleural effusion identified. Moderate changes of centrilobular emphysema identified. There is a discs shaped nodule within the right upper lobe which measures 5 mm, image 29 of series 603 and image 16 of series 15. Subsegmental atelectasis noted in the left base. Upper Abdomen: There is a low density structure in the lateral segment of left lobe measuring 7 mm. This is too small to characterize. Renal cysts noted. Musculoskeletal: Mild degenerative disc disease noted within the thoracic spine. IMPRESSION: 1. Small left pleural effusion. 2. Right upper lobe lung nodule measures 5 mm. If the patient is at high risk for bronchogenic carcinoma, follow-up chest CT at 6-12 months is recommended. If the patient is at low risk for bronchogenic carcinoma, follow-up chest CT at 12 months is recommended. This recommendation follows the consensus statement: Guidelines for Management of Small Pulmonary Nodules Detected on  CT Scans: A Statement from the [employer] as published in Radiology 2005;237:395-400. 3. Emphysema. Electronically Signed   By: Kerby Moors M.D.   On: 01/26/2015 15:51   Dg Abd Portable 1v  01/23/2015  CLINICAL DATA:  Small bowel obstruction EXAM: PORTABLE ABDOMEN - 1 VIEW COMPARISON:  Abdominal series of January 22, 2015 FINDINGS: This single supine  abdominal film reveals several moderately distended air-filled and fluid-filled bowel loops. This is predominantly small bowel. Contrast with things will within these loops is present from the CT scan of 3 days ago. There is a small amount of gas within portions of the colon. There is no definite rectal gas. The nasogastric tube tip lies in the pylorus or proximal duodenum with the proximal port in the pre-pyloric region. No free extraluminal gas is demonstrated. IMPRESSION: Persistent distal small bowel obstruction. There has not been significant progression of contrast into the colon since the abdominal CT scan of 15 November. Electronically Signed   By: David  Martinique M.D.   On: 01/23/2015 07:09   Dg Abd Portable 1v  01/22/2015  CLINICAL DATA:  Nausea, vomiting, abdominal distension, small bowel obstruction, hypertension, dementia, GERD EXAM: PORTABLE ABDOMEN - 1 VIEW COMPARISON:  01/21/2015 FINDINGS: Contrast opacifies multiple dilated small bowel loops. Persistent small bowel dilatation unchanged previous exam. Nasogastric tube coiled in stomach with tip projecting over pylorus or proximal duodenum. No definite bowel wall thickening. No significant colonic gas or stool. Bones demineralized. IMPRESSION: Persistent small bowel dilatation compatible with obstruction. Electronically Signed   By: Lavonia Dana M.D.   On: 01/22/2015 07:51   Dg Abd Portable 1v-small Bowel Obstruction Protocol-initial, 8 Hr Delay  01/21/2015  CLINICAL DATA:  8 hour delay film EXAM: PORTABLE ABDOMEN - 1 VIEW COMPARISON:  Earlier film of the same day FINDINGS: Nasogastric tube tip probably just beyond the pylorus. Multiple dilated small bowel loops as before. Some enteral contrast is now evident, predominantly in dilated small bowel loops. The colon appears decompressed. Degenerative changes in the lower lumbar spine. IMPRESSION: 1. Stable nasogastric tube into the proximal duodenum, with little change in appearance of small bowel  obstruction. Electronically Signed   By: Lucrezia Europe M.D.   On: 01/21/2015 17:39   Dg Abd Portable 1v-small Bowel Protocol-position Verification  01/21/2015  CLINICAL DATA:  Check nasogastric catheter placement EXAM: PORTABLE ABDOMEN - 1 VIEW COMPARISON:  01/20/2015 FINDINGS: A nasogastric catheter is noted coiled within the stomach. The tip is directed towards the pyloric channel. The stomach has been decompressed when compared with prior exam. The small bowel remains dilated similar to that seen on recent CT examination. Degenerative changes of lumbar spine are seen. IMPRESSION: Stable small bowel dilatation. Nasogastric catheter within the stomach. Electronically Signed   By: Inez Catalina M.D.   On: 01/21/2015 08:38    Microbiology: No results found for this or any previous visit (from the past 240 hour(s)).   Labs: Basic Metabolic Panel:  Recent Labs Lab 01/24/15 0410 01/25/15 0436 01/26/15 0435 01/27/15 0406 01/28/15 0438  NA 137 138 137 136 135  K 4.0 3.8 3.7 3.3* 3.9  CL 102 107 107 103 106  CO2 24 24 24 25 23   GLUCOSE 190* 136* 110* 109* 106*  BUN 30* 16 9 8 7   CREATININE 1.18* 0.97 0.83 0.75 0.78  CALCIUM 7.9* 7.8* 7.7* 8.2* 8.3*  MG  --   --  1.3* 1.7  --    Liver Function Tests: No results for input(s): AST, ALT, ALKPHOS, BILITOT,  PROT, ALBUMIN in the last 168 hours. No results for input(s): LIPASE, AMYLASE in the last 168 hours. No results for input(s): AMMONIA in the last 168 hours. CBC:  Recent Labs Lab 01/24/15 0410 01/25/15 0436 01/26/15 0435 01/27/15 0406 01/28/15 0438  WBC 10.5 10.0 8.9 7.8 6.6  HGB 11.3* 10.2* 10.3* 10.7* 11.0*  HCT 33.0* 30.1* 30.1* 31.4* 32.4*  MCV 86.6 86.7 86.7 85.8 86.2  PLT 251 225 228 254 279   Cardiac Enzymes: No results for input(s): CKTOTAL, CKMB, CKMBINDEX, TROPONINI in the last 168 hours. BNP: BNP (last 3 results) No results for input(s): BNP in the last 8760 hours.  ProBNP (last 3 results) No results for input(s):  PROBNP in the last 8760 hours.  CBG:  Recent Labs Lab 01/25/15 0740 01/25/15 1232 01/25/15 1650 01/25/15 2021 01/26/15 0002  GLUCAP 121* 128* 113* 99 105*       Signed:  Jaydah Stahle A  [facility] 01/30/2015, 11:03 AM

## 2015-01-30 NOTE — Progress Notes (Signed)
Patient ID: Beth Wilkins, female   DOB: 1940-06-22, 74 y.o.   MRN: [id number] [facility] Surgery Progress Note  7 Days Post-Op  Subjective: Pt doing well.  No complaints.    Objective: Vital signs in last 24 hours: Temp:  [98 F (36.7 C)-98.4 F (36.9 C)] 98 F (36.7 C) (11/25 0505) Pulse Rate:  [80-90] 80 (11/25 0505) Resp:  [16] 16 (11/25 0505) BP: (102-106)/(45-60) 104/45 mmHg (11/25 0505) SpO2:  [100 %] 100 % (11/25 0505) Last BM Date: 01/29/15  Intake/Output from previous day: 11/24 0701 - 11/25 0700 In: 2106.7 [P.O.:960; I.V.:1146.7] Out: -  Intake/Output this shift:    PE: Gen:  Alert, NAD, pleasant Abd: Soft, NT, ND,  incisional wound vac in place.  Removed.  Wound d/c/i.  No erythema  Lab Results:   Recent Labs  01/28/15 0438  WBC 6.6  HGB 11.0*  HCT 32.4*  PLT 279   BMET  Recent Labs  01/28/15 0438  NA 135  K 3.9  CL 106  CO2 23  GLUCOSE 106*  BUN 7  CREATININE 0.78  CALCIUM 8.3*   PT/INR No results for input(s): LABPROT, INR in the last 72 hours. CMP     Component Value Date/Time   NA 135 01/28/2015 0438   K 3.9 01/28/2015 0438   CL 106 01/28/2015 0438   CO2 23 01/28/2015 0438   GLUCOSE 106* 01/28/2015 0438   BUN 7 01/28/2015 0438   CREATININE 0.78 01/28/2015 0438   CALCIUM 8.3* 01/28/2015 0438   PROT 6.9 01/21/2015 0745   ALBUMIN 3.9 01/21/2015 0745   AST 25 01/21/2015 0745   ALT 22 01/21/2015 0745   ALKPHOS 66 01/21/2015 0745   BILITOT 1.4* 01/21/2015 0745   GFRNONAA >60 01/28/2015 0438   GFRAA >60 01/28/2015 0438   Lipase     Component Value Date/Time   LIPASE 55* 01/20/2015 2247       Studies/Results: No results found.  Anti-infectives: Anti-infectives    Start     Dose/Rate Route Frequency Ordered Stop   01/24/15 0400  cefoTEtan (CEFOTAN) 2 g in dextrose 5 % 50 mL IVPB     2 g 100 mL/hr over 30 Minutes Intravenous Every 12 hours 01/23/15 2116 01/24/15 0455   01/23/15 1615  ceFAZolin (ANCEF) IVPB 2  g/50 mL premix     2 g 100 mL/hr over 30 Minutes Intravenous  Once 01/23/15 1614 01/23/15 1655     Pathology report: Colon, segmental resection, with terminal ileum and cecum - INVASIVE ADENOCARCINOMA EXTENDING INTO PERICECAL CONNECTIVE TISSUE AND INVOLVING SMALL INTESTINE. - THREE BENIGN LYMPH NODES (0/3). - Margins are free of tumor - Staging T4N0 - There is a colorectal-type adenocarcinoma arising in the cecum in the area of the appendiceal orifice. The tumor extends into the pericecal connective tissue, and involves the attached segment of terminal ileum. - Immunohistochemistry shows the tumor is positive with CDX2 and cytokeratin 20, and negative with cytokeratin 7, estrogen receptor and WT-1 supporting a diagnosis of primary colorectal adenocarcinoma.   Assessment/Plan High grade SBO secondary to Invasive adenocarcinoma of cecum, appendix, ileum POD #6 s/p Diagnostic laparoscopy, Exploratory lap, Right hemi-colectomy for obstruction of right terminal ileum -Patient regular diet as tolerated.   -IS and Ambulate -wound vac d/c'd. -No need for antibiotics at this time -CEA - 5.7 (mild elevation) -PT/OT said no follow up needed, 24 hour assistance -Oral pain meds -Discussed results with the patient and her daughter.  They have a f/u appt with  Dr. Burr Medico from Oncology next week. -home today.    Leukocytosis - resolved Acute renal failure -resolved Right-sided hydronephrosis Multiple liver lesions HTN HLD Dementia DVT Proph    LOS: 9 days    Antawn Sison 01/30/2015, 12:18 PM

## 2015-02-02 ENCOUNTER — Other Ambulatory Visit: Payer: Self-pay | Admitting: Neurology

## 2015-02-05 ENCOUNTER — Encounter: Payer: Self-pay | Admitting: *Deleted

## 2015-02-05 ENCOUNTER — Other Ambulatory Visit: Payer: Self-pay | Admitting: Neurology

## 2015-02-05 ENCOUNTER — Ambulatory Visit (HOSPITAL_BASED_OUTPATIENT_CLINIC_OR_DEPARTMENT_OTHER): Payer: Medicare Other | Admitting: Hematology

## 2015-02-05 ENCOUNTER — Telehealth: Payer: Self-pay | Admitting: Hematology

## 2015-02-05 ENCOUNTER — Encounter: Payer: Self-pay | Admitting: Hematology

## 2015-02-05 VITALS — BP 102/48 | HR 79 | Temp 97.8°F | Resp 18 | Ht 65.0 in | Wt 112.8 lb

## 2015-02-05 DIAGNOSIS — C182 Malignant neoplasm of ascending colon: Secondary | ICD-10-CM

## 2015-02-05 DIAGNOSIS — F0390 Unspecified dementia without behavioral disturbance: Secondary | ICD-10-CM | POA: Diagnosis not present

## 2015-02-05 DIAGNOSIS — C180 Malignant neoplasm of cecum: Secondary | ICD-10-CM

## 2015-02-05 DIAGNOSIS — I10 Essential (primary) hypertension: Secondary | ICD-10-CM

## 2015-02-05 MED ORDER — CAPECITABINE 500 MG PO TABS: 1000.0000 mg/m2 | ORAL_TABLET | Freq: Two times a day (BID) | ORAL | Status: DC

## 2015-02-05 NOTE — Progress Notes (Signed)
Newly diagnosed stage II colon cancer [place]  [phone] [phone]  Clinic New Consult Note   Patient Care Team: [name], MD as PCP - General (Family Medicine) [name], MD as Consulting Physician (Medical Oncology) [name], MD as Consulting Physician (General Surgery) [name], MD as Consulting Physician (Orthopedic Surgery) 02/07/2015    REFERRAL PHYSICIAN: Dr. Hassell Done    CHIEF COMPLAINTS/PURPOSE OF CONSULTATION:  Newly diagnosed stage II colon cancer  HISTORY OF PRESENTING ILLNESS:  Beth Wilkins 74 y.o. female  with past medical history of moderate dementia, hypertension, is here because of recently diagnosed stage II colon cancer. She is accompanied to the clinic by her daughter. History of manic is from the chart and her daughter.  She has had abdominal discomfort, nausea and vomiting, and norexia for  the past to 3 months along with 15 pounds weight loss, . Her nausea was mild and intermittent, she does not seek medical attention initially. She presented with  worsening symptoms for 2-3 weeks, and episodes of projectile vomiting and dehydration to emergency room, was admitted to [facility] on 01/20/2015. CT scan reviewed small bowel obstruction, and possible appendiceal rupture. She was brought to OR on 01/23/2015, underwent right hemicolectomy with anastomosis by Dr. Hassell Done. Surgical path reviewed adenocarcinoma at the cecum. She was discharged home on 01/26/2015.  She has recovered well, eating much better than prior to surgery, move around without restriction. No pain, she has loose BM 3 times a day, and she has backed off her mirealax.  she lives with her daughter. She states her appetite is moderate, sometime low, and she refuses to eat if she doesn't have appetite. No nausea, vomiting, abdominal bloating since her surgery.  Her last colonoscopy was 6-8 years ago, and her stool OB was negative this year.     MEDICAL HISTORY:  Past Medical History  Diagnosis Date  . GERD (gastroesophageal reflux disease)   . Hypertension   . Hypercholesteremia   . Pelvis fracture ([place]) 01/31/2013  . Dementia     SURGICAL HISTORY: Past Surgical History  Procedure Laterality Date  . Cataract extraction, bilateral    . Laparoscopy N/A 01/23/2015    Procedure: DIAGNOSTIC LAPAROSCOPY, EXPLORATORY LAPAROTOMY  RIGHT HEMI-COLECTOMY FOR OBSTRUCTION OF RIGHT TERMINAL ILEUM;  Surgeon: [name], MD;  Location: [facility];  Service: General;  Laterality: N/A;    SOCIAL HISTORY: Social History   Social History  . Marital Status: Legally Separated    Spouse Name: N/A  . Number of Children: N/A  . Years of Education: N/A   Occupational History  . Not on file.   Social History Main Topics  . Smoking status: Former Smoker -- 0.25 packs/day for 50 years    Types: Cigarettes    Quit date: 01/25/2015  . Smokeless tobacco: Never Used     Comment: smokes occasionally  . Alcohol Use: 0.0 oz/week    0 Standard drinks or equivalent per week     Comment: occasional   . Drug Use: No  . Sexual Activity: Not on file   Other Topics Concern  . Not on file   Social History Narrative   Legally separated   Lives w/daughter, Torre Pikus   Has total of #2 daughters and #1 son   Dementia   Fall Risk-ambulates w/cane    FAMILY HISTORY: Family History  Problem Relation Age of Onset  . Diabetes Mellitus II Father   . Cervical cancer Other   . Cancer  Mother 14    ovarian cancer     ALLERGIES:  is allergic to namenda and oxycodone.  MEDICATIONS:  Current Outpatient Prescriptions  Medication Sig Dispense Refill  . acetaminophen (TYLENOL) 325 MG tablet Take 2 tablets (650 mg total) by mouth every 6 (six) hours as needed for mild pain (or Fever >/= 101).    . Cholecalciferol (VITAMIN D3) 2000 UNITS TABS Take by mouth daily.    . Coenzyme Q10 (CO Q 10) 100 MG CAPS Take by mouth daily.    Marland Kitchen donepezil  (ARICEPT) 10 MG tablet Take 1 tab daily 30 tablet 11  . esomeprazole (NEXIUM) 40 MG capsule Take 40 mg by mouth as needed.     . Ginkgo Biloba (GINKOBA PO) Take 120 mg by mouth daily.    . MULTIPLE VITAMIN PO Take by mouth daily.    . polyethylene glycol (MIRALAX / GLYCOLAX) packet Take 17 g by mouth daily. (Patient taking differently: Take 17 g by mouth as needed. ) 14 each 0  . potassium chloride SA (K-DUR,KLOR-CON) 20 MEQ tablet Take 20 mEq by mouth daily.    . simvastatin (ZOCOR) 20 MG tablet Take 20 mg by mouth daily at 6 PM.    . traMADol (ULTRAM) 50 MG tablet Take 1 tablet (50 mg total) by mouth every 6 (six) hours as needed for severe pain. 30 tablet   . triamterene-hydrochlorothiazide (MAXZIDE-25) 37.5-25 MG per tablet Take 1 tablet by mouth daily.    . vitamin B-12 (CYANOCOBALAMIN) 1000 MCG tablet Take 1,000 mcg by mouth daily.    . capecitabine (XELODA) 500 MG tablet Take 3 tablets (1,500 mg total) by mouth 2 (two) times daily after a meal. 84 tablet 3   No current facility-administered medications for this visit.    REVIEW OF SYSTEMS:   Constitutional: Denies fevers, chills or abnormal night sweats Eyes: Denies blurriness of vision, double vision or watery eyes Ears, nose, mouth, throat, and face: Denies mucositis or sore throat Respiratory: Denies cough, dyspnea or wheezes Cardiovascular: Denies palpitation, chest discomfort or lower extremity swelling Gastrointestinal:  Denies nausea, heartburn or change in bowel habits Skin: Denies abnormal skin rashes Lymphatics: Denies new lymphadenopathy or easy bruising Neurological:Denies numbness, tingling or new weaknesses Behavioral/Psych: Mood is stable, no new changes  All other systems were reviewed with the patient and are negative.  PHYSICAL EXAMINATION: ECOG PERFORMANCE STATUS: 1 - Symptomatic but completely ambulatory  Filed Vitals:   02/05/15 1142  BP: 102/48  Pulse: 79  Temp: 97.8 F (36.6 C)  Resp: 18   Filed  Weights   02/05/15 1142  Weight: 112 lb 12.8 oz (51.166 kg)    GENERAL:alert, no distress and comfortable SKIN: skin color, texture, turgor are normal, no rashes or significant lesions EYES: normal, conjunctiva are pink and non-injected, sclera clear OROPHARYNX:no exudate, no erythema and lips, buccal mucosa, and tongue normal  NECK: supple, thyroid normal size, non-tender, without nodularity LYMPH:  no palpable lymphadenopathy in the cervical, axillary or inguinal LUNGS: clear to auscultation and percussion with normal breathing effort HEART: regular rate & rhythm and no murmurs and no lower extremity edema ABDOMEN:abdomen soft, non-tender and normal bowel sounds Musculoskeletal:no cyanosis of digits and no clubbing  PSYCH: alert & oriented x 3 with fluent speech NEURO: no focal motor/sensory deficits  LABORATORY DATA:  I have reviewed the data as listed Lab Results  Component Value Date   WBC 6.6 01/28/2015   HGB 11.0* 01/28/2015   HCT 32.4* 01/28/2015   MCV  86.2 01/28/2015   PLT 279 01/28/2015    Recent Labs  01/20/15 2247 01/21/15 0745  01/26/15 0435 01/27/15 0406 01/28/15 0438  NA 129* 130*  < > 137 136 135  K 3.7 3.1*  < > 3.7 3.3* 3.9  CL 82* 88*  < > 107 103 106  CO2 30 25  < > _0 GLUCOSE 122* 97  < > 110* 109* 106*  BUN 74* 67*  < > _1 CREATININE 2.37* 1.91*  < > 0.83 0.75 0.78  CALCIUM 9.2 8.4*  < > 7.7* 8.2* 8.3*  GFRNONAA 19* 25*  < > >60 >60 >60  GFRAA 22* 29*  < > >60 >60 >60  PROT 7.9 6.9  --   --   --   --   ALBUMIN 4.5 3.9  --   --   --   --   AST 27 25  --   --   --   --   ALT 23 22  --   --   --   --   ALKPHOS 74 66  --   --   --   --   BILITOT 1.3* 1.4*  --   --   --   --   < > = values in this interval not displayed.   PATHOLOGY REPORT: Diagnosis 01/23/2015 Colon, segmental resection, with terminal ileum and cecum - INVASIVE ADENOCARCINOMA EXTENDING INTO PERICECAL CONNECTIVE TISSUE AND INVOLVING SMALL INTESTINE. - THREE BENIGN  LYMPH NODES (0/3). - SEE ONCOLOGY TABLE BELOW.  Microscopic Comment COLON AND RECTUM (INCLUDING TRANS-ANAL RESECTION): Specimen: Terminal ileum and cecum. Procedure: Resection. Tumor site: Cecum adjacent to appendix. Specimen integrity: Intact. Macroscopic intactness of mesorectum: Not applicable. Macroscopic tumor perforation: No. Invasive tumor: Maximum size: 4.0 cm. Histologic type(s): Colorectal adenocarcinoma. Histologic grade and differentiation: G2: moderately differentiated/low grade. Type of polyp in which invasive carcinoma arose: No residual polyp. Microscopic extension of invasive tumor: Into pericecal connective tissue and terminal ileum. Lymph-Vascular invasion: Not identified. Peri-neural invasion: Not identified. Tumor deposit(s) (discontinuous extramural extension): No. Resection margins: Proximal margin: Free of tumor. Distal margin: Free of tumor. Circumferential (radial) (posterior ascending, posterior descending; lateral and posterior mid-rectum; and entire lower 1/3 rectum): Free of tumor. Mesenteric margin (sigmoid and transverse): N/A. Distance closest margin (if all above margins negative): N/A. Treatment effect (neo-adjuvant therapy): No. Additional polyp(s): No. Non-neoplastic findings: N/A. Lymph nodes: number examined 3; number positive: 0. Pathologic Staging: pT4b, pN0, pMX. Ancillary studies: Mismatch repair protein by immunohistochemistry pending. Comments: There is a colorectal-type adenocarcinoma arising in the cecum in the area of the appendiceal orifice. The tumor extends into the pericecal connective tissue, and involves the attached segment of terminal ileum. Immunohistochemistry shows the tumor is positive with CDX2 and cytokeratin 20, and negative with cytokeratin 7, estrogen receptor and WT-1 supporting a diagnosis of primary colorectal adenocarcinoma. ADDITIONAL INFORMATION: Mismatch Repair (MMR) Protein Immunohistochemistry (IHC) IHC  Expression Result: MLH1: Preserved nuclear expression (greater 50% tumor expression) MSH2: Preserved nuclear expression (greater 50% tumor expression) MSH6: Preserved nuclear expression (greater 50% tumor expression) PMS2: Preserved nuclear expression (greater 50% tumor expression) * Internal control demonstrates intact nuclear expression Interpretation: NORMAL There is preserved expression of the major and minor MMR proteins. There is a very low probability that microsatellite instability (MSI) is present. However, certain clinically significant MMR protein mutations may result in preservation of nuclear expression. It is recommended that the preservation of protein expression be correlated with molecular based MSI  testing.  RADIOGRAPHIC STUDIES: I have personally reviewed the radiological images as listed and agreed with the findings in the report.  Ct Abdomen Pelvis Wo Contrast  01/21/2015  CLINICAL DATA:  Abdominal distention for 4 days with nausea and vomiting. Decreased urine output. EXAM: CT ABDOMEN AND PELVIS WITHOUT CONTRAST TECHNIQUE: Multidetector CT imaging of the abdomen and pelvis was performed following the standard protocol without IV contrast. COMPARISON:  None. FINDINGS: Lower chest:  The included lung bases are clear. Liver: Ladder density lesion in the anterior subcapsular left lobe measures 3.6 cm and is consistent with simple cyst. Additional smaller hypodense lesions in the left and anterior right lobe are incompletely characterized without contrast. Hepatobiliary: Gallbladder physiologically distended, displaced laterally by dilated stomach. No calcified gallstone. Pancreas: Normal.  No ductal dilatation or surrounding inflammation. Spleen: Small soft tissue density in the left upper quadrant, likely diminutive spleen. Adrenal glands: No nodule. Kidneys: Multiple bilateral low-density lesions in both kidneys consistent with cysts. Mild right hydroureteronephrosis, distal  ureter is suboptimally defined. There is an nonobstructing stone in the lower right kidney. No ureteral calculi. No left hydronephrosis. Stomach/Bowel: Stomach markedly distended with fluid. Diffuse fluid-filled dilated small bowel, measuring up to 4.1 cm. Transition point suspected in the right lower quadrant, however no definite decompressed small bowel is seen. No pneumatosis. Small volume of stool throughout the colon without colonic wall thickening. The ileocecal valve not well-defined. The appendix is not definitively seen. Vascular/Lymphatic: No retroperitoneal adenopathy. Abdominal aorta is normal in caliber. Dense atherosclerosis of the abdominal aorta and its branches. Reproductive: Uterus appears in situ, suboptimally defined due to fluid-filled adjacent bowel loops. Limited assessment for adnexal mass. Bladder: Minimally distended, irregular/tubular in shape. No wall thickening. Displaced posteriorly due to dilated small bowel. Other: Mild mesenteric edema centrally.  No free air or free fluid. Musculoskeletal: There are no acute or suspicious osseous abnormalities. Advanced facet arthropathy in the lumbar spine. Transitional lumbosacral anatomy. IMPRESSION: 1. High-grade small bowel obstruction with transition point in the right lower quadrant. No definite decompressed small bowel loops are seen, suspect distal ileum a site of obstruction. No perforation, pneumatosis or free fluid. 2. Right hydroureteronephrosis, however no obstructing lesion or stone. There is a nonobstructing stone in the lower right kidney. Multiple bilateral renal cysts. 3. Multiple liver lesions, largest represents a simple cyst. Smaller lesions are incompletely characterized without contrast. Electronically Signed   By: Jeb Levering M.D.   On: 01/21/2015 03:26   Ct Chest W Contrast  01/26/2015  CLINICAL DATA:  Hypoxia.  Bowel obstruction. EXAM: CT CHEST WITH CONTRAST TECHNIQUE: Multidetector CT imaging of the chest was  performed during intravenous contrast administration. CONTRAST:  20m OMNIPAQUE IOHEXOL 300 MG/ML  SOLN COMPARISON:  None FINDINGS: Mediastinum: The heart size appears normal. There is no pericardial effusion identified. The trachea is patent and is midline. There is a nasogastric tube within the esophagus. Tip is looped within the stomach. There is no mediastinal or hilar adenopathy. No enlarged axillary or supraclavicular lymph nodes. Lungs/Pleura: Small left pleural effusion identified. Moderate changes of centrilobular emphysema identified. There is a discs shaped nodule within the right upper lobe which measures 5 mm, image 29 of series 603 and image 16 of series 15. Subsegmental atelectasis noted in the left base. Upper Abdomen: There is a low density structure in the lateral segment of left lobe measuring 7 mm. This is too small to characterize. Renal cysts noted. Musculoskeletal: Mild degenerative disc disease noted within the thoracic spine.  IMPRESSION: 1.  Small left pleural effusion. 2. Right upper lobe lung nodule measures 5 mm. If the patient is at high risk for bronchogenic carcinoma, follow-up chest CT at 6-12 months is recommended. If the patient is at low risk for bronchogenic carcinoma, follow-up chest CT at 12 months is recommended. This recommendation follows the consensus statement: Guidelines for Management of Small Pulmonary Nodules Detected on CT Scans: A Statement from the [employer] as published in Radiology 2005;237:395-400. 3. Emphysema. Electronically Signed   By: Kerby Moors M.D.   On: 01/26/2015 15:51     ASSESSMENT & PLAN: 74 year old African-American female, with past medical history of moderate dementia, independent with ADLs, hypertension, presents with small bowel obstruction and weight loss.  1. 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 -We reviewed the natural history of colon cancer and moderate risk of recurrence for stage II colon cancer.  -She does have high risk features, including T4b disease, small bowel obstruction, appendiceal perforation, and limited lymph nodes removal (<12). In high risk stage II colon cancer, we do recommend adjuvant chemotherapy. Her tumor is MSI stable, given her advanced age and comorbidities, especially dementia, I recommend single agent capecitabine for total of 6 months. I do not think she is a candidate for more intense regimen such as FOLFOX. -Patient has limited understanding of her cancer prognosis, she watched her mother going through chemotherapy for colon cancer, and is reluctant to take chemotherapy. However her daughter, who is her power of attorney, is highly motivated and wants her to try chemotherapy. -I do have some concern about her tolerance to chemotherapy. I explained the common side effects of chemotherapy, which including but does not not limited to, fatigue, nausea, vomiting, diarrhea, hair loss, neuropathy, fluid retention, renal and kidney dysfunction, neutropenic fever, needed for blood transfusion, bleeding, coronary artery spasm and heart attack, etc.  -I encouraged her to come in for a chemotherapy class, to have better understanding of chemotherapy and potential side effects. -If she decides to take chemotherapy, I'll tentatively start her in early January 2017 -I'll send a prescription of capecitabine to [place], to see if she has high co-pay. --Alternatively if she refuses or could not tolerate chemotherapy, I will start her on on cancer surveillance, which includes physical exam and lab every 3-4 months for the first few years, then every 6 months for a total of 5 years. We'll obtain CT scan every 6-12 months, she needs a colonoscopy in one year.  2. Hypertension, dementia, -She will continue follow-up with her primary  care physician  Plan -Chemotherapy class -Return to clinic in early January to finalize if she wants to try adjuvant chemotherapy -I will send a prescription of capecitabine to specialty pharmacy.   All questions were answered. The patient knows to call the clinic with any problems, questions or concerns. I spent 55 minutes counseling the patient face to face. The total time spent in the appointment was 60 minutes and more than 50% was on counseling.     [name], MD 02/07/2015 5:32 PM

## 2015-02-05 NOTE — Progress Notes (Signed)
Oncology Nurse Navigator Documentation  Oncology Nurse Navigator Flowsheets 02/05/2015  Referral date to RadOnc/MedOnc 01/27/2015  Navigator Encounter Type Initial MedOnc  Patient Visit Type Medonc  Treatment Phase Treatment planning  Barriers/Navigation Needs Family concerns;Education  Education Understanding Cancer/ Treatment Options;Coping with Diagnosis/ Prognosis;Preparing for Upcoming Surgery/ Treatment;Newly Diagnosed Cancer Education;Pain/ Symptom Management  Interventions Referrals;Education Method  Referrals Social Work;Nutrition/dietician  Education Method Verbal;Written;[place]  Time Spent with Patient 38  Met with patient and daughter during new patient visit. Explained the role of the GI Nurse Navigator and provided New Patient Packet with information on: 1. Colon cancer 2. Support groups 3. Fall Safety Plan 4. Financial Support Team Answered questions, reviewed current treatment plan using TEACH back and provided emotional support. Provided copy of current treatment plan. Explained authorization process for Xeloda and that she will be called by our Oral Chemo pharmacist.   Merceda Elks, RN, BSN GI Oncology [place]

## 2015-02-05 NOTE — Telephone Encounter (Signed)
per pfo to sch pt appt-gave pt copy of avs °

## 2015-02-05 NOTE — Telephone Encounter (Signed)
per pof to sch pt appt-gave pt copy of avs °

## 2015-02-06 ENCOUNTER — Telehealth: Payer: Self-pay | Admitting: Pharmacist

## 2015-02-06 NOTE — Telephone Encounter (Signed)
Faxed Xeloda Rx to [place]

## 2015-02-07 ENCOUNTER — Encounter: Payer: Self-pay | Admitting: Hematology

## 2015-02-07 ENCOUNTER — Other Ambulatory Visit: Payer: Self-pay | Admitting: Neurology

## 2015-02-09 ENCOUNTER — Telehealth: Payer: Self-pay | Admitting: Neurology

## 2015-02-09 NOTE — Telephone Encounter (Signed)
Pt/daughter/[name]/called to inform that the refill for med/ Donepezil/ hasn't been sent to the [facility] on [address]/call back 2 [phone]

## 2015-02-09 NOTE — Telephone Encounter (Signed)
Returned call advised patient's daughter to check with pharmacy Rx has been sent.

## 2015-02-12 ENCOUNTER — Encounter: Payer: Self-pay | Admitting: *Deleted

## 2015-02-12 NOTE — Progress Notes (Signed)
[facility]  Clinical Social Work was referred by patient navigator for assessment of psychosocial needs due to need for caregiver support.  Clinical Social Worker contacted patient's daughter at home to offer support and assess for needs. Daughter provides daily care for her mother, who appears to really not be able to be left alone. Family has ability to afford CNAs, but has not found a good fit for a caregiver to date. Pt is not open to [facility] adult [facility] currently. CSW suggested this resource for additional caregiver support as well. CSW explained role of CSW team and how we could further help through cancer journey. Daughter very open to additional support and agrees to CSW follow up at next [facility] appt. She agrees to reach out sooner as needed and was provided with CSW contact info.   Clinical Social Work interventions:  Supportive listening Resource education  Loren Racer, [place] Worker [place]  [employer] Phone: [phone] Fax: [phone]

## 2015-02-19 ENCOUNTER — Telehealth: Payer: Self-pay | Admitting: Pharmacist

## 2015-02-19 NOTE — Telephone Encounter (Signed)
12/15 - per Patient insurance. Xeloda rx requires specialty pharmacy. Faxed to [facility] as designated pharmacy per [employer] provider. Fax #: [phone]

## 2015-02-24 ENCOUNTER — Ambulatory Visit: Payer: Medicare Other

## 2015-02-24 ENCOUNTER — Encounter: Payer: Self-pay | Admitting: *Deleted

## 2015-02-24 ENCOUNTER — Ambulatory Visit: Payer: Medicare Other | Admitting: Nutrition

## 2015-02-24 NOTE — Progress Notes (Signed)
74 year old female diagnosed with stage II colon cancer.  She is a patient of Dr. Burr Medico.  Past medical history includes GERD, hypertension, hypercholesterolemia and dementia.  Medications include Xeloda, vitamin D3, coenzyme Q10, Nexium, ginkgo biloba, multivitamin, MiraLAX, Zocor, and vitamin B12.  Labs include glucose 106 on November 23.  Height: 65 inches. Weight: 116.4 pounds. Usual body weight: 120-125 pounds. BMI: 19.37.  Met with patient and daughter. Patient reports appetite and oral intake has improved. Weight improved to 116.4 pounds from 112.8 pounds. Patient is consuming 3 meals daily along with one to 2 snacks. Daughter prefers to provide mother with oral nutrition supplements that do not have high fructose corn syrup. Daughter is cooking daily for her mother. Has questions regarding food safety.  Nutrition diagnosis:  Unintended weight loss related to new diagnosis of colon cancer and associated treatments as evidenced by a 15 pound weight loss over 3 months.  Intervention:  Educated patient to consume 3 meals +3 snacks daily focusing on high-protein foods. Provided daughter with a list of oral nutrition supplements such as simply boost, Enu, and Orgain along with purchasing information. Reviewed importance of minimizing weight loss.  Fact sheets were provided. Educated patient and daughter on food safety and provided fact sheet Questions were answered.  Teach back method used.  Contact information was given and samples were provided.  Monitoring, evaluation, goals: Patient will tolerate adequate calories and protein to minimize weight loss.  Next visit: To be scheduled as needed.  Patient will contact me by email or phone for questions.  **Disclaimer: This note was dictated with voice recognition software. Similar sounding words can inadvertently be transcribed and this note may contain transcription errors which may not have been corrected upon publication of  note.**

## 2015-02-24 NOTE — Progress Notes (Signed)
Chemo consent for xeloda signed by patient's daughter, her 76.

## 2015-02-26 ENCOUNTER — Other Ambulatory Visit: Payer: Medicare Other

## 2015-03-09 ENCOUNTER — Encounter: Payer: Self-pay | Admitting: Hematology

## 2015-03-09 NOTE — Progress Notes (Signed)
Newly diagnosed stage II colon cancer [place]  [phone] [phone]  Clinic Follow Up Note   Patient Care Team: [name], MD as PCP - General (Family Medicine) [name], MD as Consulting Physician (Medical Oncology) [name], MD as Consulting Physician (General Surgery) [name], MD as Consulting Physician (Orthopedic Surgery) 03/10/2015     CHIEF COMPLAINTS:  Follow up stage II colon cancer  Oncology History   Cancer of right colon [facility])   Staging form: Colon and Rectum, AJCC 7th Edition     Pathologic stage from 01/23/2015: Stage IIC (T4b, N0, cM0) - Signed by [name], MD on 02/05/2015       Cancer of right colon ([place])   01/21/2015 Imaging  CT abdomen and pelvis showed high-grade small bowel obstruction,  right I [place] ureteral nephrosis,  multiple liver cysts.  CT chest was negative for metastatic lesion.   01/23/2015 Pathology Results  invasive adenocarcinoma extending into the pericecal connective tissue,  grade 2, LVI (-),  no perineural invasion, 3 lymph nodes were negative.   01/23/2015 Surgery   terminal ileum and cecum seegmental resection with anastomosis by Dr. Hassell Done   01/23/2015 Initial Diagnosis Cancer of right colon ([place])    HISTORY OF PRESENTING ILLNESS (02/05/2015):  Beth Wilkins 75 y.o. female  with past medical history of moderate dementia, hypertension, is here because of recently diagnosed stage II colon cancer. She is accompanied to the clinic by her daughter. History of manic is from the chart and her daughter.  She has had abdominal discomfort, nausea and vomiting, and norexia for  the past to 3 months along with 15 pounds weight loss, . Her nausea was mild and intermittent, she does not seek medical attention initially. She presented with  worsening symptoms for 2-3 weeks, and episodes of projectile vomiting and dehydration to emergency room, was admitted to [facility] on 01/20/2015. CT scan  reviewed small bowel obstruction, and possible appendiceal rupture. She was brought to OR on 01/23/2015, underwent right hemicolectomy with anastomosis by Dr. Hassell Done. Surgical path reviewed adenocarcinoma at the cecum. She was discharged home on 01/26/2015.  She has recovered well, eating much better than prior to surgery, move around without restriction. No pain, she has loose BM 3 times a day, and she has backed off her mirealax.  she lives with her daughter. She states her appetite is moderate, sometime low, and she refuses to eat if she doesn't have appetite. No nausea, vomiting, abdominal bloating since her surgery.  Her last colonoscopy was 6-8 years ago, and her stool OB was negative this year.   CURRENT THERAPY: pending  Adjuvant chemotherapy with Decitabine 1500 milligram, every 12 hours, 2 weeks on, and one week off, plan for a total of 8 cycles   INTERIM HISTORY: Beth Wilkins returns for follow-up. She is accompanied by her daughter to clinic today. She is doing well overall. She denies any significant pain, abdominal discomfort , or change of her bowel habits. She has good appetite and eating well. She has good energy level, is able to tolerate his routine activity without much difficulties. She has overall recovered better from her surgery.   MEDICAL HISTORY:  Past Medical History  Diagnosis Date  . GERD (gastroesophageal reflux disease)   . Hypertension   . Hypercholesteremia   . Pelvis fracture ([place]) 01/31/2013  . Dementia     SURGICAL HISTORY: Past Surgical History  Procedure Laterality Date  . Cataract extraction, bilateral    .  Laparoscopy N/A 01/23/2015    Procedure: DIAGNOSTIC LAPAROSCOPY, EXPLORATORY LAPAROTOMY  RIGHT HEMI-COLECTOMY FOR OBSTRUCTION OF RIGHT TERMINAL ILEUM;  Surgeon: [name], MD;  Location: [facility];  Service: General;  Laterality: N/A;    SOCIAL HISTORY: Social History   Social History  . Marital Status: Legally Separated    Spouse Name: N/A  .  Number of Children: N/A  . Years of Education: N/A   Occupational History  . Not on file.   Social History Main Topics  . Smoking status: Former Smoker -- 0.25 packs/day for 50 years    Types: Cigarettes    Quit date: 01/25/2015  . Smokeless tobacco: Never Used     Comment: smokes occasionally  . Alcohol Use: 0.0 oz/week    0 Standard drinks or equivalent per week     Comment: occasional   . Drug Use: No  . Sexual Activity: Not on file   Other Topics Concern  . Not on file   Social History Narrative   Legally separated   Lives w/daughter, Connie Lasater   Has total of #2 daughters and #1 son   Dementia   Fall Risk-ambulates w/cane    FAMILY HISTORY: Family History  Problem Relation Age of Onset  . Diabetes Mellitus II Father   . Cervical cancer Other   . Cancer Mother 12    ovarian cancer     ALLERGIES:  is allergic to namenda and oxycodone.  MEDICATIONS:  Current Outpatient Prescriptions  Medication Sig Dispense Refill  . acetaminophen (TYLENOL) 325 MG tablet Take 2 tablets (650 mg total) by mouth every 6 (six) hours as needed for mild pain (or Fever >/= 101).    . Cholecalciferol (VITAMIN D3) 2000 UNITS TABS Take by mouth daily.    . Coenzyme Q10 (CO Q 10) 100 MG CAPS Take by mouth daily.    Marland Kitchen donepezil (ARICEPT) 10 MG tablet Take 1 tab daily 30 tablet 11  . esomeprazole (NEXIUM) 40 MG capsule Take 40 mg by mouth as needed.     . MULTIPLE VITAMIN PO Take by mouth daily. Take Geritol - MVI with Iron.  Take 1 Tbsp daily.    . polyethylene glycol (MIRALAX / GLYCOLAX) packet Take 17 g by mouth daily. (Patient taking differently: Take 17 g by mouth as needed. ) 14 each 0  . potassium chloride SA (K-DUR,KLOR-CON) 20 MEQ tablet Take 20 mEq by mouth daily.    . simvastatin (ZOCOR) 20 MG tablet Take 20 mg by mouth daily at 6 PM.    . traMADol (ULTRAM) 50 MG tablet Take 1 tablet (50 mg total) by mouth every 6 (six) hours as needed for severe pain. 30 tablet   .  triamterene-hydrochlorothiazide (MAXZIDE-25) 37.5-25 MG per tablet Take 1 tablet by mouth daily.    . capecitabine (XELODA) 500 MG tablet Take 3 tablets (1,500 mg total) by mouth 2 (two) times daily after a meal. (Patient not taking: Reported on 03/10/2015) 84 tablet 3   No current facility-administered medications for this visit.    REVIEW OF SYSTEMS:   Constitutional: Denies fevers, chills or abnormal night sweats Eyes: Denies blurriness of vision, double vision or watery eyes Ears, nose, mouth, throat, and face: Denies mucositis or sore throat Respiratory: Denies cough, dyspnea or wheezes Cardiovascular: Denies palpitation, chest discomfort or lower extremity swelling Gastrointestinal:  Denies nausea, heartburn or change in bowel habits Skin: Denies abnormal skin rashes Lymphatics: Denies new lymphadenopathy or easy bruising Neurological:Denies numbness, tingling or new weaknesses Behavioral/Psych:  Mood is stable, no new changes  All other systems were reviewed with the patient and are negative.  PHYSICAL EXAMINATION: ECOG PERFORMANCE STATUS: 1 - Symptomatic but completely ambulatory  Filed Vitals:   03/10/15 1327  BP: 157/75  Pulse: 65  Temp: 98.1 F (36.7 C)  Resp: 17   Filed Weights   03/10/15 1327  Weight: 117 lb 9.6 oz (53.343 kg)    GENERAL:alert, no distress and comfortable SKIN: skin color, texture, turgor are normal, no rashes or significant lesions EYES: normal, conjunctiva are pink and non-injected, sclera clear OROPHARYNX:no exudate, no erythema and lips, buccal mucosa, and tongue normal  NECK: supple, thyroid normal size, non-tender, without nodularity LYMPH:  no palpable lymphadenopathy in the cervical, axillary or inguinal LUNGS: clear to auscultation and percussion with normal breathing effort HEART: regular rate & rhythm and no murmurs and no lower extremity edema ABDOMEN:abdomen soft, non-tender and normal bowel sounds Musculoskeletal:no cyanosis of  digits and no clubbing  PSYCH: alert & oriented x 3 with fluent speech NEURO: no focal motor/sensory deficits  LABORATORY DATA:  I have reviewed the data as listed CBC Latest Ref Rng 03/10/2015 01/28/2015 01/27/2015  WBC 3.9 - 10.3 10e3/uL 8.8 6.6 7.8  Hemoglobin 11.6 - 15.9 g/dL 10.8(L) 11.0(L) 10.7(L)  Hematocrit 34.8 - 46.6 % 32.3(L) 32.4(L) 31.4(L)  Platelets 145 - 400 10e3/uL 335 279 254    CMP Latest Ref Rng 03/10/2015 01/28/2015 01/27/2015  Glucose 70 - 140 mg/dl 85 106(H) 109(H)  BUN 7.0 - 26.0 mg/dL 17._0 Creatinine 0.6 - 1.1 mg/dL 1.1 0.78 0.75  Sodium 136 - 145 mEq/L 141 135 136  Potassium 3.5 - 5.1 mEq/L 4.0 3.9 3.3(L)  Chloride 101 - 111 mmol/L - 106 103  CO2 22 - 29 mEq/L _1 Calcium 8.4 - 10.4 mg/dL 9.3 8.3(L) 8.2(L)  Total Protein 6.4 - 8.3 g/dL 7.6 - -  Total Bilirubin 0.20 - 1.20 mg/dL 1.06 - -  Alkaline Phos 40 - 150 U/L 65 - -  AST 5 - 34 U/L 19 - -  ALT 0 - 55 U/L 9 - -     PATHOLOGY REPORT: Diagnosis 01/23/2015 Colon, segmental resection, with terminal ileum and cecum - INVASIVE ADENOCARCINOMA EXTENDING INTO PERICECAL CONNECTIVE TISSUE AND INVOLVING SMALL INTESTINE. - THREE BENIGN LYMPH NODES (0/3). - SEE ONCOLOGY TABLE BELOW.  Microscopic Comment COLON AND RECTUM (INCLUDING TRANS-ANAL RESECTION): Specimen: Terminal ileum and cecum. Procedure: Resection. Tumor site: Cecum adjacent to appendix. Specimen integrity: Intact. Macroscopic intactness of mesorectum: Not applicable. Macroscopic tumor perforation: No. Invasive tumor: Maximum size: 4.0 cm. Histologic type(s): Colorectal adenocarcinoma. Histologic grade and differentiation: G2: moderately differentiated/low grade. Type of polyp in which invasive carcinoma arose: No residual polyp. Microscopic extension of invasive tumor: Into pericecal connective tissue and terminal ileum. Lymph-Vascular invasion: Not identified. Peri-neural invasion: Not identified. Tumor deposit(s)  (discontinuous extramural extension): No. Resection margins: Proximal margin: Free of tumor. Distal margin: Free of tumor. Circumferential (radial) (posterior ascending, posterior descending; lateral and posterior mid-rectum; and entire lower 1/3 rectum): Free of tumor. Mesenteric margin (sigmoid and transverse): N/A. Distance closest margin (if all above margins negative): N/A. Treatment effect (neo-adjuvant therapy): No. Additional polyp(s): No. Non-neoplastic findings: N/A. Lymph nodes: number examined 3; number positive: 0. Pathologic Staging: pT4b, pN0, pMX. Ancillary studies: Mismatch repair protein by immunohistochemistry pending. Comments: There is a colorectal-type adenocarcinoma arising in the cecum in the area of the appendiceal orifice. The tumor extends into the pericecal connective tissue, and involves the  attached segment of terminal ileum. Immunohistochemistry shows the tumor is positive with CDX2 and cytokeratin 20, and negative with cytokeratin 7, estrogen receptor and WT-1 supporting a diagnosis of primary colorectal adenocarcinoma. ADDITIONAL INFORMATION: Mismatch Repair (MMR) Protein Immunohistochemistry (IHC) IHC Expression Result: MLH1: Preserved nuclear expression (greater 50% tumor expression) MSH2: Preserved nuclear expression (greater 50% tumor expression) MSH6: Preserved nuclear expression (greater 50% tumor expression) PMS2: Preserved nuclear expression (greater 50% tumor expression) * Internal control demonstrates intact nuclear expression Interpretation: NORMAL There is preserved expression of the major and minor MMR proteins. There is a very low probability that microsatellite instability (MSI) is present. However, certain clinically significant MMR protein mutations may result in preservation of nuclear expression. It is recommended that the preservation of protein expression be correlated with molecular based MSI testing.  RADIOGRAPHIC STUDIES: I  have personally reviewed the radiological images as listed and agreed with the findings in the report.  Ct Abdomen Pelvis Wo Contrast  01/21/2015  CLINICAL DATA:  Abdominal distention for 4 days with nausea and vomiting. Decreased urine output. EXAM: CT ABDOMEN AND PELVIS WITHOUT CONTRAST TECHNIQUE: Multidetector CT imaging of the abdomen and pelvis was performed following the standard protocol without IV contrast. COMPARISON:  None. FINDINGS: Lower chest:  The included lung bases are clear. Liver: Ladder density lesion in the anterior subcapsular left lobe measures 3.6 cm and is consistent with simple cyst. Additional smaller hypodense lesions in the left and anterior right lobe are incompletely characterized without contrast. Hepatobiliary: Gallbladder physiologically distended, displaced laterally by dilated stomach. No calcified gallstone. Pancreas: Normal.  No ductal dilatation or surrounding inflammation. Spleen: Small soft tissue density in the left upper quadrant, likely diminutive spleen. Adrenal glands: No nodule. Kidneys: Multiple bilateral low-density lesions in both kidneys consistent with cysts. Mild right hydroureteronephrosis, distal ureter is suboptimally defined. There is an nonobstructing stone in the lower right kidney. No ureteral calculi. No left hydronephrosis. Stomach/Bowel: Stomach markedly distended with fluid. Diffuse fluid-filled dilated small bowel, measuring up to 4.1 cm. Transition point suspected in the right lower quadrant, however no definite decompressed small bowel is seen. No pneumatosis. Small volume of stool throughout the colon without colonic wall thickening. The ileocecal valve not well-defined. The appendix is not definitively seen. Vascular/Lymphatic: No retroperitoneal adenopathy. Abdominal aorta is normal in caliber. Dense atherosclerosis of the abdominal aorta and its branches. Reproductive: Uterus appears in situ, suboptimally defined due to fluid-filled adjacent  bowel loops. Limited assessment for adnexal mass. Bladder: Minimally distended, irregular/tubular in shape. No wall thickening. Displaced posteriorly due to dilated small bowel. Other: Mild mesenteric edema centrally.  No free air or free fluid. Musculoskeletal: There are no acute or suspicious osseous abnormalities. Advanced facet arthropathy in the lumbar spine. Transitional lumbosacral anatomy. IMPRESSION: 1. High-grade small bowel obstruction with transition point in the right lower quadrant. No definite decompressed small bowel loops are seen, suspect distal ileum a site of obstruction. No perforation, pneumatosis or free fluid. 2. Right hydroureteronephrosis, however no obstructing lesion or stone. There is a nonobstructing stone in the lower right kidney. Multiple bilateral renal cysts. 3. Multiple liver lesions, largest represents a simple cyst. Smaller lesions are incompletely characterized without contrast. Electronically Signed   By: Jeb Levering M.D.   On: 01/21/2015 03:26   Ct Chest W Contrast  01/26/2015  CLINICAL DATA:  Hypoxia.  Bowel obstruction. EXAM: CT CHEST WITH CONTRAST TECHNIQUE: Multidetector CT imaging of the chest was performed during intravenous contrast administration. CONTRAST:  65m OMNIPAQUE IOHEXOL 300 MG/ML  SOLN  COMPARISON:  None FINDINGS: Mediastinum: The heart size appears normal. There is no pericardial effusion identified. The trachea is patent and is midline. There is a nasogastric tube within the esophagus. Tip is looped within the stomach. There is no mediastinal or hilar adenopathy. No enlarged axillary or supraclavicular lymph nodes. Lungs/Pleura: Small left pleural effusion identified. Moderate changes of centrilobular emphysema identified. There is a discs shaped nodule within the right upper lobe which measures 5 mm, image 29 of series 603 and image 16 of series 15. Subsegmental atelectasis noted in the left base. Upper Abdomen: There is a low density structure  in the lateral segment of left lobe measuring 7 mm. This is too small to characterize. Renal cysts noted. Musculoskeletal: Mild degenerative disc disease noted within the thoracic spine.  IMPRESSION: 1. Small left pleural effusion. 2. Right upper lobe lung nodule measures 5 mm. If the patient is at high risk for bronchogenic carcinoma, follow-up chest CT at 6-12 months is recommended. If the patient is at low risk for bronchogenic carcinoma, follow-up chest CT at 12 months is recommended. This recommendation follows the consensus statement: Guidelines for Management of Small Pulmonary Nodules Detected on CT Scans: A Statement from the [place] as published in Radiology 2005;237:395-400. 3. Emphysema. Electronically Signed   By: Kerby Moors M.D.   On: 01/26/2015 15:51     ASSESSMENT & PLAN: 75 year old African-American female, with past medical history of moderate dementia, independent with ADLs, hypertension, presents with small bowel obstruction and weight loss.  1. 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 - patient participated chemotherapy class last week, she was initially reluctant,  But more open to the chemotherapy and now. - she is waiting for capecitabine to be delivered by specialty pharmacy [facility], my nurse will follow up on that  - she'll start as long as she receive it,  I went through the dosage and main side effects with her and her daughter again today - I'll see her back in 7-10 days after she starts the first cycle  2. Hypertension, dementia, -She will continue follow-up with her primary care physician  3. Anemia - she has a mild normocytic anemia,  Hypo-productive - her lab results of B12, folate, iron and ferritin were normal, TIBC were , this is consistent with anemia of chronic disease. - we'll continue monitoring.  Plan -She will  start capecitabine 1555m q12h as soon as she receives it,  Her daughter will call my office when she receives it -I will see her in 7-10 days after she starts chem at home   All questions were answered. The patient knows to call the clinic with any problems, questions or concerns. I spent 20 minutes counseling the patient face to face. The total time spent in the appointment was 25 minutes and more than 50% was on counseling.     FTruitt Merle MD 03/10/2015 1:56 PM

## 2015-03-10 ENCOUNTER — Ambulatory Visit: Payer: Medicare Other | Admitting: Hematology

## 2015-03-10 ENCOUNTER — Telehealth: Payer: Self-pay | Admitting: Pharmacist

## 2015-03-10 ENCOUNTER — Ambulatory Visit (HOSPITAL_BASED_OUTPATIENT_CLINIC_OR_DEPARTMENT_OTHER): Payer: Medicare Other | Admitting: Hematology

## 2015-03-10 ENCOUNTER — Other Ambulatory Visit: Payer: Medicare Other

## 2015-03-10 ENCOUNTER — Other Ambulatory Visit (HOSPITAL_BASED_OUTPATIENT_CLINIC_OR_DEPARTMENT_OTHER): Payer: Medicare Other

## 2015-03-10 VITALS — BP 157/75 | HR 65 | Temp 98.1°F | Resp 17 | Ht 65.0 in | Wt 117.6 lb

## 2015-03-10 DIAGNOSIS — I10 Essential (primary) hypertension: Secondary | ICD-10-CM

## 2015-03-10 DIAGNOSIS — C182 Malignant neoplasm of ascending colon: Secondary | ICD-10-CM | POA: Diagnosis not present

## 2015-03-10 DIAGNOSIS — D649 Anemia, unspecified: Secondary | ICD-10-CM | POA: Diagnosis not present

## 2015-03-10 LAB — CBC WITH DIFFERENTIAL/PLATELET
BASO%: 0.9 % (ref 0.0–2.0)
Basophils Absolute: 0.1 10*3/uL (ref 0.0–0.1)
EOS%: 3.1 % (ref 0.0–7.0)
Eosinophils Absolute: 0.3 10*3/uL (ref 0.0–0.5)
HEMATOCRIT: 32.3 % — AB (ref 34.8–46.6)
HGB: 10.8 g/dL — ABNORMAL LOW (ref 11.6–15.9)
LYMPH#: 2.2 10*3/uL (ref 0.9–3.3)
LYMPH%: 25.1 % (ref 14.0–49.7)
MCH: 29.3 pg (ref 25.1–34.0)
MCHC: 33.3 g/dL (ref 31.5–36.0)
MCV: 87.9 fL (ref 79.5–101.0)
MONO#: 0.5 10*3/uL (ref 0.1–0.9)
MONO%: 6.2 % (ref 0.0–14.0)
NEUT#: 5.7 10*3/uL (ref 1.5–6.5)
NEUT%: 64.7 % (ref 38.4–76.8)
Platelets: 335 10*3/uL (ref 145–400)
RBC: 3.67 10*6/uL — AB (ref 3.70–5.45)
RDW: 18.7 % — ABNORMAL HIGH (ref 11.2–14.5)
WBC: 8.8 10*3/uL (ref 3.9–10.3)

## 2015-03-10 LAB — COMPREHENSIVE METABOLIC PANEL
ALT: 9 U/L (ref 0–55)
AST: 19 U/L (ref 5–34)
Albumin: 3.9 g/dL (ref 3.5–5.0)
Alkaline Phosphatase: 65 U/L (ref 40–150)
Anion Gap: 10 mEq/L (ref 3–11)
BUN: 17.1 mg/dL (ref 7.0–26.0)
CHLORIDE: 105 meq/L (ref 98–109)
CO2: 27 meq/L (ref 22–29)
CREATININE: 1.1 mg/dL (ref 0.6–1.1)
Calcium: 9.3 mg/dL (ref 8.4–10.4)
EGFR: 56 mL/min/{1.73_m2} — ABNORMAL LOW (ref >90)
GLUCOSE: 85 mg/dL (ref 70–140)
POTASSIUM: 4 meq/L (ref 3.5–5.1)
SODIUM: 141 meq/L (ref 136–145)
Total Bilirubin: 1.06 mg/dL (ref 0.20–1.20)
Total Protein: 7.6 g/dL (ref 6.4–8.3)

## 2015-03-10 NOTE — Telephone Encounter (Signed)
03/10/2015: Left message for patient's Daughter, Lorriane Shire, on her cell phone [phone]) regarding medication: Xeloda. Patient has not received shipment from [facility]. I have called [facility] (Phone: [phone]) today and there was an issue with their processing the order last week (issue was on [facility]'s end and not related to a clinical issue). They will expedite shipment processing so patient will hopefully receive the medication by 1/4 or 1/5.   Thank you,  Montel Clock, PharmD, [facility]

## 2015-03-12 MED FILL — CAPECITABINE 500 MG TABLET: 500 | 14 days supply | Qty: 84 | Fill #0

## 2015-03-13 ENCOUNTER — Other Ambulatory Visit: Payer: Self-pay | Admitting: Hematology

## 2015-03-13 ENCOUNTER — Telehealth: Payer: Self-pay | Admitting: *Deleted

## 2015-03-13 NOTE — Telephone Encounter (Signed)
Daughter called to report start date of Xeloda is today 03/13/15 at 11:00.

## 2015-03-13 NOTE — Telephone Encounter (Signed)
I have sent POF for her follow up appointemnts.  Beth Wilkins  03/13/2015

## 2015-03-17 ENCOUNTER — Telehealth: Payer: Self-pay | Admitting: Hematology

## 2015-03-17 NOTE — Telephone Encounter (Signed)
Left message for patient re lab/fu 1/16 and 1/24. Schedule mailed.

## 2015-03-20 ENCOUNTER — Other Ambulatory Visit: Payer: Self-pay | Admitting: *Deleted

## 2015-03-20 ENCOUNTER — Encounter: Payer: Self-pay | Admitting: Pharmacist

## 2015-03-20 DIAGNOSIS — C182 Malignant neoplasm of ascending colon: Secondary | ICD-10-CM

## 2015-03-20 NOTE — Progress Notes (Signed)
Oral Chemotherapy Follow-Up Form  Original Start date of oral chemotherapy: _1/6/17_   Called patient today to follow up regarding patient's oral chemotherapy medication: _Xeloda__  Pt is doing well today. Spoke with Daughter, Lorriane Shire. Pt has dementia and takes aricept for this. Compliance will be important to monitor closely in Ms. Silvera due to this. Her daughter helps Ms. Orsini take her medications and make sure she is compliant but states that patient does not always want to take her medication. She missed 1 dose earlier this week due to being tired after dinner and just forgot.   Pt reports _1___ doses missed in the last week.  Missed dose(s) attributed to: ___Forgot______  Pt reports the following side effects: __none____  Other Issues: _Dementia___   Will follow up and call patient again in _1 week___   Thank you,  Montel Clock, PharmD, [facility]

## 2015-03-23 ENCOUNTER — Ambulatory Visit (HOSPITAL_BASED_OUTPATIENT_CLINIC_OR_DEPARTMENT_OTHER): Payer: Medicare Other | Admitting: Nurse Practitioner

## 2015-03-23 ENCOUNTER — Other Ambulatory Visit (HOSPITAL_BASED_OUTPATIENT_CLINIC_OR_DEPARTMENT_OTHER): Payer: Medicare Other

## 2015-03-23 VITALS — BP 158/92 | HR 68 | Temp 97.9°F | Resp 18 | Ht 65.0 in | Wt 119.4 lb

## 2015-03-23 DIAGNOSIS — C182 Malignant neoplasm of ascending colon: Secondary | ICD-10-CM

## 2015-03-23 LAB — COMPREHENSIVE METABOLIC PANEL
AST: 16 U/L (ref 5–34)
Albumin: 3.9 g/dL (ref 3.5–5.0)
Alkaline Phosphatase: 64 U/L (ref 40–150)
Anion Gap: 10 mEq/L (ref 3–11)
BUN: 17.7 mg/dL (ref 7.0–26.0)
CHLORIDE: 107 meq/L (ref 98–109)
CO2: 25 meq/L (ref 22–29)
CREATININE: 1.2 mg/dL — AB (ref 0.6–1.1)
Calcium: 9.2 mg/dL (ref 8.4–10.4)
EGFR: 52 mL/min/{1.73_m2} — ABNORMAL LOW (ref >90)
Glucose: 94 mg/dl (ref 70–140)
POTASSIUM: 3.9 meq/L (ref 3.5–5.1)
Sodium: 142 mEq/L (ref 136–145)
Total Bilirubin: 1.32 mg/dL — ABNORMAL HIGH (ref 0.20–1.20)
Total Protein: 7.2 g/dL (ref 6.4–8.3)

## 2015-03-23 LAB — CBC WITH DIFFERENTIAL/PLATELET
BASO%: 0.7 % (ref 0.0–2.0)
Basophils Absolute: 0 10*3/uL (ref 0.0–0.1)
EOS%: 4.8 % (ref 0.0–7.0)
Eosinophils Absolute: 0.3 10*3/uL (ref 0.0–0.5)
HCT: 32 % — ABNORMAL LOW (ref 34.8–46.6)
HGB: 10.6 g/dL — ABNORMAL LOW (ref 11.6–15.9)
LYMPH%: 32.8 % (ref 14.0–49.7)
MCH: 29.5 pg (ref 25.1–34.0)
MCHC: 33.1 g/dL (ref 31.5–36.0)
MCV: 89.1 fL (ref 79.5–101.0)
MONO#: 0.4 10*3/uL (ref 0.1–0.9)
MONO%: 7.6 % (ref 0.0–14.0)
NEUT#: 3.2 10*3/uL (ref 1.5–6.5)
NEUT%: 54.1 % (ref 38.4–76.8)
Platelets: 282 10*3/uL (ref 145–400)
RBC: 3.59 10*6/uL — AB (ref 3.70–5.45)
RDW: 18.2 % — ABNORMAL HIGH (ref 11.2–14.5)
WBC: 5.8 10*3/uL (ref 3.9–10.3)
lymph#: 1.9 10*3/uL (ref 0.9–3.3)

## 2015-03-23 NOTE — Progress Notes (Signed)
  Beth Wilkins OFFICE PROGRESS NOTE   Diagnosis:  Colon cancer Oncology History   Cancer of right colon [facility])  Staging form: Colon and Rectum, AJCC 7th Edition  Pathologic stage from 01/23/2015: Stage IIC (T4b, N0, cM0) - Signed by [name], MD on 02/05/2015       Cancer of right colon ([place])   01/21/2015 Imaging CT abdomen and pelvis showed high-grade small bowel obstruction, right hydroureteronephrosis, multiple liver cysts. CT chest was negative for metastatic lesion.   01/23/2015 Pathology Results invasive adenocarcinoma extending into the pericecal connective tissue, grade 2, LVI (-), no perineural invasion, 3 lymph nodes were negative.   01/23/2015 Surgery  terminal ileum and cecum seegmental resection with anastomosis by Dr. Hassell Done   01/23/2015 Initial Diagnosis Cancer of right colon [facility])         INTERVAL HISTORY:   Beth Wilkins returns as scheduled. She began cycle 1 adjuvant Xeloda 03/13/2015. She denies nausea/vomiting. No mouth sores. No diarrhea. No hand or foot pain or redness. She has missed 2 doses thus far in the cycle.  Objective:  Vital signs in last 24 hours:  Blood pressure 158/92, pulse 68, temperature 97.9 F (36.6 C), temperature source Oral, resp. rate 18, height '5\' 5"'$  (1.651 m), weight 119 lb 6.4 oz (54.159 kg), SpO2 100 %.    HEENT: No thrush or ulcers. Resp: Lungs clear bilaterally. Cardio: Regular rate and rhythm. GI: Abdomen soft and nontender. No hepatomegaly. Well-healed surgical incision. Vascular: No leg edema. Calves soft and nontender. Neuro: Alert. Follows commands.  Skin: Palms mildly dry. No erythema or skin breakdown.    Lab Results:  Lab Results  Component Value Date   WBC 5.8 03/23/2015   HGB 10.6* 03/23/2015   HCT 32.0* 03/23/2015   MCV 89.1 03/23/2015   PLT 282 03/23/2015   NEUTROABS 3.2 03/23/2015    Imaging:  No results found.  Medications: I have reviewed the patient's  current medications.  Assessment/Plan: 1. 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 Hypertension, dementia 3. Anemia, mild normocytic. B-12, folate, iron and ferritin normal. Consistent with anemia of chronic disease. Stable 03/23/2015.   Disposition: Beth Wilkins appears stable. She is completing cycle 1 adjuvant Xeloda. Thus far she appears to be tolerating the Xeloda well. She will return for a follow-up visit on 03/31/2015. She will contact the office in the interim with any problems.    Ned Card ANP/GNP-BC   03/23/2015  10:01 AM

## 2015-03-31 ENCOUNTER — Telehealth: Payer: Self-pay | Admitting: Hematology

## 2015-03-31 ENCOUNTER — Encounter: Payer: Self-pay | Admitting: Hematology

## 2015-03-31 ENCOUNTER — Ambulatory Visit (HOSPITAL_BASED_OUTPATIENT_CLINIC_OR_DEPARTMENT_OTHER): Payer: Medicare Other | Admitting: Hematology

## 2015-03-31 ENCOUNTER — Other Ambulatory Visit (HOSPITAL_BASED_OUTPATIENT_CLINIC_OR_DEPARTMENT_OTHER): Payer: Medicare Other

## 2015-03-31 VITALS — BP 148/59 | HR 62 | Temp 98.4°F | Resp 18 | Ht 65.0 in | Wt 123.3 lb

## 2015-03-31 DIAGNOSIS — D638 Anemia in other chronic diseases classified elsewhere: Secondary | ICD-10-CM | POA: Insufficient documentation

## 2015-03-31 DIAGNOSIS — F0390 Unspecified dementia without behavioral disturbance: Secondary | ICD-10-CM

## 2015-03-31 DIAGNOSIS — D649 Anemia, unspecified: Secondary | ICD-10-CM | POA: Diagnosis not present

## 2015-03-31 DIAGNOSIS — C182 Malignant neoplasm of ascending colon: Secondary | ICD-10-CM | POA: Diagnosis not present

## 2015-03-31 DIAGNOSIS — I10 Essential (primary) hypertension: Secondary | ICD-10-CM

## 2015-03-31 LAB — CBC WITH DIFFERENTIAL/PLATELET
BASO%: 0.7 % (ref 0.0–2.0)
Basophils Absolute: 0.1 10*3/uL (ref 0.0–0.1)
EOS%: 5.5 % (ref 0.0–7.0)
Eosinophils Absolute: 0.4 10*3/uL (ref 0.0–0.5)
HEMATOCRIT: 30.6 % — AB (ref 34.8–46.6)
HEMOGLOBIN: 10.4 g/dL — AB (ref 11.6–15.9)
LYMPH%: 31.6 % (ref 14.0–49.7)
MCH: 30.7 pg (ref 25.1–34.0)
MCHC: 34 g/dL (ref 31.5–36.0)
MCV: 90.3 fL (ref 79.5–101.0)
MONO#: 0.5 10*3/uL (ref 0.1–0.9)
MONO%: 7.8 % (ref 0.0–14.0)
NEUT%: 54.4 % (ref 38.4–76.8)
NEUTROS ABS: 3.6 10*3/uL (ref 1.5–6.5)
Platelets: 249 10*3/uL (ref 145–400)
RBC: 3.39 10*6/uL — ABNORMAL LOW (ref 3.70–5.45)
RDW: 19.2 % — AB (ref 11.2–14.5)
WBC: 6.7 10*3/uL (ref 3.9–10.3)
lymph#: 2.1 10*3/uL (ref 0.9–3.3)

## 2015-03-31 LAB — COMPREHENSIVE METABOLIC PANEL
ALBUMIN: 4 g/dL (ref 3.5–5.0)
ALK PHOS: 63 U/L (ref 40–150)
ALT: <9 U/L (ref 0–55)
AST: 17 U/L (ref 5–34)
Anion Gap: 9 mEq/L (ref 3–11)
BILIRUBIN TOTAL: 0.87 mg/dL (ref 0.20–1.20)
BUN: 22.2 mg/dL (ref 7.0–26.0)
CALCIUM: 9.2 mg/dL (ref 8.4–10.4)
CO2: 28 mEq/L (ref 22–29)
CREATININE: 1.5 mg/dL — AB (ref 0.6–1.1)
Chloride: 106 mEq/L (ref 98–109)
EGFR: 40 mL/min/{1.73_m2} — ABNORMAL LOW (ref >90)
GLUCOSE: 89 mg/dL (ref 70–140)
POTASSIUM: 3.8 meq/L (ref 3.5–5.1)
Sodium: 142 mEq/L (ref 136–145)
TOTAL PROTEIN: 7.4 g/dL (ref 6.4–8.3)

## 2015-03-31 NOTE — Telephone Encounter (Signed)
Talked with and scheduled this patient’s appointment(s) while patient was here in our office.       [facility]. °

## 2015-03-31 NOTE — Progress Notes (Signed)
Newly diagnosed stage II colon cancer [place]  [phone] [phone]  Clinic Follow Up Note   Patient Care Team: [name], MD as PCP - General (Family Medicine) [name], MD as Consulting Physician (Medical Oncology) [name], MD as Consulting Physician (General Surgery) [name], MD as Consulting Physician (Orthopedic Surgery) 03/31/2015     CHIEF COMPLAINTS:  Follow up stage II colon cancer  Oncology History   Cancer of right colon [facility])   Staging form: Colon and Rectum, AJCC 7th Edition     Pathologic stage from 01/23/2015: Stage IIC (T4b, N0, cM0) - Signed by [name], MD on 02/05/2015       Cancer of right colon ([place])   01/21/2015 Imaging  CT abdomen and pelvis showed high-grade small bowel obstruction,  right I [place] ureteral nephrosis,  multiple liver cysts.  CT chest was negative for metastatic lesion.   01/23/2015 Pathology Results  invasive adenocarcinoma extending into the pericecal connective tissue,  grade 2, LVI (-),  no perineural invasion, 3 lymph nodes were negative.   01/23/2015 Surgery   terminal ileum and cecum seegmental resection with anastomosis by Dr. Hassell Done   01/23/2015 Initial Diagnosis Cancer of right colon ([place])   03/13/2015 -  Chemotherapy Xeloda 1500 mg bid X 14 days on-7 off    HISTORY OF PRESENTING ILLNESS (02/05/2015):  Beth Wilkins 75 y.o. female  with past medical history of moderate dementia, hypertension, is here because of recently diagnosed stage II colon cancer. She is accompanied to the clinic by her daughter. History of manic is from the chart and her daughter.  She has had abdominal discomfort, nausea and vomiting, and norexia for  the past to 3 months along with 15 pounds weight loss, . Her nausea was mild and intermittent, she does not seek medical attention initially. She presented with  worsening symptoms for 2-3 weeks, and episodes of projectile vomiting and dehydration to  emergency room, was admitted to [facility] on 01/20/2015. CT scan reviewed small bowel obstruction, and possible appendiceal rupture. She was brought to OR on 01/23/2015, underwent right hemicolectomy with anastomosis by Dr. Hassell Done. Surgical path reviewed adenocarcinoma at the cecum. She was discharged home on 01/26/2015.  She has recovered well, eating much better than prior to surgery, move around without restriction. No pain, she has loose BM 3 times a day, and she has backed off her mirealax.  she lives with her daughter. She states her appetite is moderate, sometime low, and she refuses to eat if she doesn't have appetite. No nausea, vomiting, abdominal bloating since her surgery.  Her last colonoscopy was 6-8 years ago, and her stool OB was negative this year.   CURRENT THERAPY: Adjuvant chemotherapy with Capecitabine 1500 milligram, every 12 hours, 2 weeks on, and one week off, started on 03/13/15, plan for a total of 8 cycles   INTERIM HISTORY: [name] returns for follow-up. She has completed the first cycle chemotherapy last week, and a tolerated well overall. She had mild fatigue, but no significant nausea, diarrhea, change of appetite or other complaints. She is accompanied by her daughter to the clinic, who takes good care of her at home and make sure she takes medications.  MEDICAL HISTORY:  Past Medical History  Diagnosis Date  . GERD (gastroesophageal reflux disease)   . Hypertension   . Hypercholesteremia   . Pelvis fracture ([place]) 01/31/2013  . Dementia     SURGICAL HISTORY: Past Surgical History  Procedure Laterality Date  .  Cataract extraction, bilateral    . Laparoscopy N/A 01/23/2015    Procedure: DIAGNOSTIC LAPAROSCOPY, EXPLORATORY LAPAROTOMY  RIGHT HEMI-COLECTOMY FOR OBSTRUCTION OF RIGHT TERMINAL ILEUM;  Surgeon: [name], MD;  Location: [facility];  Service: General;  Laterality: N/A;    SOCIAL HISTORY: Social History   Social History  . Marital Status: Legally  Separated    Spouse Name: N/A  . Number of Children: N/A  . Years of Education: N/A   Occupational History  . Not on file.   Social History Main Topics  . Smoking status: Former Smoker -- 0.25 packs/day for 50 years    Types: Cigarettes    Quit date: 01/25/2015  . Smokeless tobacco: Never Used     Comment: smokes occasionally  . Alcohol Use: 0.0 oz/week    0 Standard drinks or equivalent per week     Comment: occasional   . Drug Use: No  . Sexual Activity: Not on file   Other Topics Concern  . Not on file   Social History Narrative   Legally separated   Lives w/daughter, Beth Wilkins   Has total of #2 daughters and #1 son   Dementia   Fall Risk-ambulates w/cane    FAMILY HISTORY: Family History  Problem Relation Age of Onset  . Diabetes Mellitus II Father   . Cervical cancer Other   . Cancer Mother 74    ovarian cancer     ALLERGIES:  is allergic to namenda and oxycodone.  MEDICATIONS:  Current Outpatient Prescriptions  Medication Sig Dispense Refill  . acetaminophen (TYLENOL) 325 MG tablet Take 2 tablets (650 mg total) by mouth every 6 (six) hours as needed for mild pain (or Fever >/= 101).    . capecitabine (XELODA) 500 MG tablet Take 3 tablets (1,500 mg total) by mouth 2 (two) times daily after a meal. 84 tablet 3  . Cholecalciferol (VITAMIN D3) 2000 UNITS TABS Take by mouth daily.    . Coenzyme Q10 (CO Q 10) 100 MG CAPS Take by mouth daily.    Marland Kitchen donepezil (ARICEPT) 10 MG tablet Take 1 tab daily 30 tablet 11  . esomeprazole (NEXIUM) 40 MG capsule Take 40 mg by mouth as needed.     . MULTIPLE VITAMIN PO Take by mouth daily. Take Geritol - MVI with Iron.  Take 1 Tbsp daily.    . polyethylene glycol (MIRALAX / GLYCOLAX) packet Take 17 g by mouth daily. (Patient taking differently: Take 17 g by mouth as needed. ) 14 each 0  . potassium chloride SA (K-DUR,KLOR-CON) 20 MEQ tablet Take 20 mEq by mouth daily.    . simvastatin (ZOCOR) 20 MG tablet Take 20 mg by  mouth daily at 6 PM.    . triamterene-hydrochlorothiazide (MAXZIDE-25) 37.5-25 MG per tablet Take 1 tablet by mouth daily.     No current facility-administered medications for this visit.    REVIEW OF SYSTEMS:   Constitutional: Denies fevers, chills or abnormal night sweats Eyes: Denies blurriness of vision, double vision or watery eyes Ears, nose, mouth, throat, and face: Denies mucositis or sore throat Respiratory: Denies cough, dyspnea or wheezes Cardiovascular: Denies palpitation, chest discomfort or lower extremity swelling Gastrointestinal:  Denies nausea, heartburn or change in bowel habits Skin: Denies abnormal skin rashes Lymphatics: Denies new lymphadenopathy or easy bruising Neurological:Denies numbness, tingling or new weaknesses Behavioral/Psych: Mood is stable, no new changes  All other systems were reviewed with the patient and are negative.  PHYSICAL EXAMINATION: ECOG PERFORMANCE STATUS: 1 -  Symptomatic but completely ambulatory  Filed Vitals:   03/31/15 1411  BP: 148/59  Pulse: 62  Temp: 98.4 F (36.9 C)  Resp: 18   Filed Weights   03/31/15 1411  Weight: 123 lb 4.8 oz (55.929 kg)    GENERAL:alert, no distress and comfortable SKIN: skin color, texture, turgor are normal, no rashes or significant lesions EYES: normal, conjunctiva are pink and non-injected, sclera clear OROPHARYNX:no exudate, no erythema and lips, buccal mucosa, and tongue normal  NECK: supple, thyroid normal size, non-tender, without nodularity LYMPH:  no palpable lymphadenopathy in the cervical, axillary or inguinal LUNGS: clear to auscultation and percussion with normal breathing effort HEART: regular rate & rhythm and no murmurs and no lower extremity edema ABDOMEN:abdomen soft, non-tender and normal bowel sounds Musculoskeletal:no cyanosis of digits and no clubbing  PSYCH: alert & oriented x 3 with fluent speech NEURO: no focal motor/sensory deficits  LABORATORY DATA:  I have  reviewed the data as listed CBC Latest Ref Rng 03/31/2015 03/23/2015 03/10/2015  WBC 3.9 - 10.3 10e3/uL 6.7 5.8 8.8  Hemoglobin 11.6 - 15.9 g/dL 10.4(L) 10.6(L) 10.8(L)  Hematocrit 34.8 - 46.6 % 30.6(L) 32.0(L) 32.3(L)  Platelets 145 - 400 10e3/uL 249 282 335    CMP Latest Ref Rng 03/23/2015 03/10/2015 01/28/2015  Glucose 70 - 140 mg/dl 94 85 106(H)  BUN 7.0 - 26.0 mg/dL 17.7 17.1 7  Creatinine 0.6 - 1.1 mg/dL 1.2(H) 1.1 0.78  Sodium 136 - 145 mEq/L 142 141 135  Potassium 3.5 - 5.1 mEq/L 3.9 4.0 3.9  Chloride 101 - 111 mmol/L - - 106  CO2 22 - 29 mEq/L '25 27 23  '$ Calcium 8.4 - 10.4 mg/dL 9.2 9.3 8.3(L)  Total Protein 6.4 - 8.3 g/dL 7.2 7.6 -  Total Bilirubin 0.20 - 1.20 mg/dL 1.32(H) 1.06 -  Alkaline Phos 40 - 150 U/L 64 65 -  AST 5 - 34 U/L 16 19 -  ALT 0 - 55 U/L <9 9 -     PATHOLOGY REPORT: Diagnosis 01/23/2015 Colon, segmental resection, with terminal ileum and cecum - INVASIVE ADENOCARCINOMA EXTENDING INTO PERICECAL CONNECTIVE TISSUE AND INVOLVING SMALL INTESTINE. - THREE BENIGN LYMPH NODES (0/3). - SEE ONCOLOGY TABLE BELOW.  Microscopic Comment COLON AND RECTUM (INCLUDING TRANS-ANAL RESECTION): Specimen: Terminal ileum and cecum. Procedure: Resection. Tumor site: Cecum adjacent to appendix. Specimen integrity: Intact. Macroscopic intactness of mesorectum: Not applicable. Macroscopic tumor perforation: No. Invasive tumor: Maximum size: 4.0 cm. Histologic type(s): Colorectal adenocarcinoma. Histologic grade and differentiation: G2: moderately differentiated/low grade. Type of polyp in which invasive carcinoma arose: No residual polyp. Microscopic extension of invasive tumor: Into pericecal connective tissue and terminal ileum. Lymph-Vascular invasion: Not identified. Peri-neural invasion: Not identified. Tumor deposit(s) (discontinuous extramural extension): No. Resection margins: Proximal margin: Free of tumor. Distal margin: Free of tumor. Circumferential (radial)  (posterior ascending, posterior descending; lateral and posterior mid-rectum; and entire lower 1/3 rectum): Free of tumor. Mesenteric margin (sigmoid and transverse): N/A. Distance closest margin (if all above margins negative): N/A. Treatment effect (neo-adjuvant therapy): No. Additional polyp(s): No. Non-neoplastic findings: N/A. Lymph nodes: number examined 3; number positive: 0. Pathologic Staging: pT4b, pN0, pMX. Ancillary studies: Mismatch repair protein by immunohistochemistry pending. Comments: There is a colorectal-type adenocarcinoma arising in the cecum in the area of the appendiceal orifice. The tumor extends into the pericecal connective tissue, and involves the attached segment of terminal ileum. Immunohistochemistry shows the tumor is positive with CDX2 and cytokeratin 20, and negative with cytokeratin 7, estrogen receptor and WT-1 supporting  a diagnosis of primary colorectal adenocarcinoma. ADDITIONAL INFORMATION: Mismatch Repair (MMR) Protein Immunohistochemistry (IHC) IHC Expression Result: MLH1: Preserved nuclear expression (greater 50% tumor expression) MSH2: Preserved nuclear expression (greater 50% tumor expression) MSH6: Preserved nuclear expression (greater 50% tumor expression) PMS2: Preserved nuclear expression (greater 50% tumor expression) * Internal control demonstrates intact nuclear expression Interpretation: NORMAL There is preserved expression of the major and minor MMR proteins. There is a very low probability that microsatellite instability (MSI) is present. However, certain clinically significant MMR protein mutations may result in preservation of nuclear expression. It is recommended that the preservation of protein expression be correlated with molecular based MSI testing.  RADIOGRAPHIC STUDIES: I have personally reviewed the radiological images as listed and agreed with the findings in the report.  Ct Abdomen Pelvis Wo Contrast  01/21/2015   CLINICAL DATA:  Abdominal distention for 4 days with nausea and vomiting. Decreased urine output. EXAM: CT ABDOMEN AND PELVIS WITHOUT CONTRAST TECHNIQUE: Multidetector CT imaging of the abdomen and pelvis was performed following the standard protocol without IV contrast. COMPARISON:  None. FINDINGS: Lower chest:  The included lung bases are clear. Liver: Ladder density lesion in the anterior subcapsular left lobe measures 3.6 cm and is consistent with simple cyst. Additional smaller hypodense lesions in the left and anterior right lobe are incompletely characterized without contrast. Hepatobiliary: Gallbladder physiologically distended, displaced laterally by dilated stomach. No calcified gallstone. Pancreas: Normal.  No ductal dilatation or surrounding inflammation. Spleen: Small soft tissue density in the left upper quadrant, likely diminutive spleen. Adrenal glands: No nodule. Kidneys: Multiple bilateral low-density lesions in both kidneys consistent with cysts. Mild right hydroureteronephrosis, distal ureter is suboptimally defined. There is an nonobstructing stone in the lower right kidney. No ureteral calculi. No left hydronephrosis. Stomach/Bowel: Stomach markedly distended with fluid. Diffuse fluid-filled dilated small bowel, measuring up to 4.1 cm. Transition point suspected in the right lower quadrant, however no definite decompressed small bowel is seen. No pneumatosis. Small volume of stool throughout the colon without colonic wall thickening. The ileocecal valve not well-defined. The appendix is not definitively seen. Vascular/Lymphatic: No retroperitoneal adenopathy. Abdominal aorta is normal in caliber. Dense atherosclerosis of the abdominal aorta and its branches. Reproductive: Uterus appears in situ, suboptimally defined due to fluid-filled adjacent bowel loops. Limited assessment for adnexal mass. Bladder: Minimally distended, irregular/tubular in shape. No wall thickening. Displaced posteriorly  due to dilated small bowel. Other: Mild mesenteric edema centrally.  No free air or free fluid. Musculoskeletal: There are no acute or suspicious osseous abnormalities. Advanced facet arthropathy in the lumbar spine. Transitional lumbosacral anatomy. IMPRESSION: 1. High-grade small bowel obstruction with transition point in the right lower quadrant. No definite decompressed small bowel loops are seen, suspect distal ileum a site of obstruction. No perforation, pneumatosis or free fluid. 2. Right hydroureteronephrosis, however no obstructing lesion or stone. There is a nonobstructing stone in the lower right kidney. Multiple bilateral renal cysts. 3. Multiple liver lesions, largest represents a simple cyst. Smaller lesions are incompletely characterized without contrast. Electronically Signed   By: Jeb Levering M.D.   On: 01/21/2015 03:26   Ct Chest W Contrast  01/26/2015  CLINICAL DATA:  Hypoxia.  Bowel obstruction. EXAM: CT CHEST WITH CONTRAST TECHNIQUE: Multidetector CT imaging of the chest was performed during intravenous contrast administration. CONTRAST:  38m OMNIPAQUE IOHEXOL 300 MG/ML  SOLN COMPARISON:  None FINDINGS: Mediastinum: The heart size appears normal. There is no pericardial effusion identified. The trachea is patent and is midline. There is a  nasogastric tube within the esophagus. Tip is looped within the stomach. There is no mediastinal or hilar adenopathy. No enlarged axillary or supraclavicular lymph nodes. Lungs/Pleura: Small left pleural effusion identified. Moderate changes of centrilobular emphysema identified. There is a discs shaped nodule within the right upper lobe which measures 5 mm, image 29 of series 603 and image 16 of series 15. Subsegmental atelectasis noted in the left base. Upper Abdomen: There is a low density structure in the lateral segment of left lobe measuring 7 mm. This is too small to characterize. Renal cysts noted. Musculoskeletal: Mild degenerative disc  disease noted within the thoracic spine.  IMPRESSION: 1. Small left pleural effusion. 2. Right upper lobe lung nodule measures 5 mm. If the patient is at high risk for bronchogenic carcinoma, follow-up chest CT at 6-12 months is recommended. If the patient is at low risk for bronchogenic carcinoma, follow-up chest CT at 12 months is recommended. This recommendation follows the consensus statement: Guidelines for Management of Small Pulmonary Nodules Detected on CT Scans: A Statement from the [employer] as published in Radiology 2005;237:395-400. 3. Emphysema. Electronically Signed   By: Kerby Moors M.D.   On: 01/26/2015 15:51     ASSESSMENT & PLAN: 75 year old African-American female, with past medical history of moderate dementia, independent with ADLs, hypertension, presents with small bowel obstruction and weight loss.  1. 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 -she tolerated the first cycle capecitabine very well, we'll continue. -she will return in 3 weeks for follow-up  2. Hypertension, dementia, -She will continue follow-up with her primary care physician -stable   3. Anemia - she has a mild normocytic anemia,  Hypo-productive - her lab results of B12, folate, iron and ferritin were normal, TIBC were , this is likely anemia of chronic disease. - we'll continue monitoring.  Plan -She will start second cycle capecitabine '1500mg'$  q12h on 1/27, Her daughter were reviewed today -I will see her back in 3 weeks with lab.  All questions were answered. The patient knows to call the clinic with any problems, questions or concerns. I spent 15 minutes counseling the patient face to face. The total time spent in the appointment was 20 minutes and more than 50% was on counseling.     [name], MD 03/31/2015 2:20 PM

## 2015-04-01 MED FILL — CAPECITABINE 500 MG TABLET: 500 | 14 days supply | Qty: 84 | Fill #1

## 2015-04-21 ENCOUNTER — Telehealth: Payer: Self-pay | Admitting: Hematology

## 2015-04-21 ENCOUNTER — Encounter: Payer: Self-pay | Admitting: Hematology

## 2015-04-21 ENCOUNTER — Encounter: Payer: Self-pay | Admitting: Pharmacist

## 2015-04-21 ENCOUNTER — Ambulatory Visit (HOSPITAL_BASED_OUTPATIENT_CLINIC_OR_DEPARTMENT_OTHER): Payer: Medicare Other | Admitting: Hematology

## 2015-04-21 ENCOUNTER — Other Ambulatory Visit (HOSPITAL_BASED_OUTPATIENT_CLINIC_OR_DEPARTMENT_OTHER): Payer: Medicare Other

## 2015-04-21 VITALS — BP 144/75 | HR 63 | Temp 97.9°F | Resp 18 | Ht 65.0 in | Wt 119.8 lb

## 2015-04-21 DIAGNOSIS — D638 Anemia in other chronic diseases classified elsewhere: Secondary | ICD-10-CM

## 2015-04-21 DIAGNOSIS — C180 Malignant neoplasm of cecum: Secondary | ICD-10-CM | POA: Diagnosis not present

## 2015-04-21 DIAGNOSIS — D649 Anemia, unspecified: Secondary | ICD-10-CM

## 2015-04-21 DIAGNOSIS — C182 Malignant neoplasm of ascending colon: Secondary | ICD-10-CM

## 2015-04-21 DIAGNOSIS — I10 Essential (primary) hypertension: Secondary | ICD-10-CM | POA: Diagnosis not present

## 2015-04-21 DIAGNOSIS — N183 Chronic kidney disease, stage 3 (moderate): Secondary | ICD-10-CM

## 2015-04-21 LAB — CBC WITH DIFFERENTIAL/PLATELET
BASO%: 1 % (ref 0.0–2.0)
BASOS ABS: 0.1 10*3/uL (ref 0.0–0.1)
EOS ABS: 0.3 10*3/uL (ref 0.0–0.5)
EOS%: 4.4 % (ref 0.0–7.0)
HEMATOCRIT: 32.9 % — AB (ref 34.8–46.6)
HEMOGLOBIN: 11 g/dL — AB (ref 11.6–15.9)
LYMPH#: 2 10*3/uL (ref 0.9–3.3)
LYMPH%: 33 % (ref 14.0–49.7)
MCH: 31.7 pg (ref 25.1–34.0)
MCHC: 33.5 g/dL (ref 31.5–36.0)
MCV: 94.4 fL (ref 79.5–101.0)
MONO#: 0.5 10*3/uL (ref 0.1–0.9)
MONO%: 8.7 % (ref 0.0–14.0)
NEUT#: 3.3 10*3/uL (ref 1.5–6.5)
NEUT%: 52.9 % (ref 38.4–76.8)
Platelets: 239 10*3/uL (ref 145–400)
RBC: 3.48 10*6/uL — ABNORMAL LOW (ref 3.70–5.45)
RDW: 21.5 % — AB (ref 11.2–14.5)
WBC: 6.2 10*3/uL (ref 3.9–10.3)

## 2015-04-21 LAB — COMPREHENSIVE METABOLIC PANEL
ALBUMIN: 4.3 g/dL (ref 3.5–5.0)
ALK PHOS: 60 U/L (ref 40–150)
ALT: 10 U/L (ref 0–55)
AST: 19 U/L (ref 5–34)
Anion Gap: 11 mEq/L (ref 3–11)
BILIRUBIN TOTAL: 1.53 mg/dL — AB (ref 0.20–1.20)
BUN: 22.3 mg/dL (ref 7.0–26.0)
CALCIUM: 9.6 mg/dL (ref 8.4–10.4)
CO2: 25 mEq/L (ref 22–29)
Chloride: 108 mEq/L (ref 98–109)
Creatinine: 1.4 mg/dL — ABNORMAL HIGH (ref 0.6–1.1)
EGFR: 45 mL/min/{1.73_m2} — AB (ref >90)
Glucose: 92 mg/dl (ref 70–140)
POTASSIUM: 3.6 meq/L (ref 3.5–5.1)
Sodium: 143 mEq/L (ref 136–145)
Total Protein: 7.6 g/dL (ref 6.4–8.3)

## 2015-04-21 MED FILL — CAPECITABINE 500 MG TABLET: 500 | 14 days supply | Qty: 84 | Fill #2

## 2015-04-21 NOTE — Progress Notes (Signed)
Oral Chemotherapy Follow-Up Form  Original Start date of oral chemotherapy: _1/6/17_   Called patient today to follow up regarding patient's oral chemotherapy medication: _Xeloda__  Pt is doing well today. Spoke with Daughter, Lorriane Shire. Pt has dementia and takes aricept for this. Compliance will be important to monitor closely in Ms. Dunigan due to this. Her daughter helps Ms. Cutshaw take her medications and make sure she is compliant but states that patient does not always want to take her medication. She missed 1 dose earlier this week due to being tired after dinner and just forgot. No side effects to report. Ready to start cycle 3 this week  Pt reports _1___ doses missed in the last week.  Missed dose(s) attributed to: ___Forgot/tired______  Pt reports the following side effects: __none____  Other Issues: _Dementia___   Will follow up and call patient again in _3 week___   Thank you,  Montel Clock, PharmD, [facility]

## 2015-04-21 NOTE — Telephone Encounter (Signed)
per pof to sch pt appt-gave pt copy of avs °

## 2015-04-21 NOTE — Progress Notes (Signed)
[facility]  [phone] [phone]  Clinic Follow Up Note   Patient Care Team: Juluis Rainier, MD as PCP - General (Family Medicine) Malachy Mood, MD as Consulting Physician (Medical Oncology) Luretha Murphy, MD as Consulting Physician (General Surgery) Beverely Low, MD as Consulting Physician (Orthopedic Surgery) 04/21/2015     CHIEF COMPLAINTS:  Follow up stage II colon cancer  Oncology History   Cancer of right colon [facility])   Staging form: Colon and Rectum, AJCC 7th Edition     Pathologic stage from 01/23/2015: Stage IIC (T4b, N0, cM0) - Signed by Malachy Mood, MD on 02/05/2015       Cancer of right colon (HCC)   01/21/2015 Imaging  CT abdomen and pelvis showed high-grade small bowel obstruction,  right I [place] ureteral nephrosis,  multiple liver cysts.  CT chest was negative for metastatic lesion.   01/23/2015 Pathology Results  invasive adenocarcinoma extending into the pericecal connective tissue,  grade 2, LVI (-),  no perineural invasion, 3 lymph nodes were negative.   01/23/2015 Surgery   terminal ileum and cecum seegmental resection with anastomosis by Dr. Daphine Deutscher   01/23/2015 Initial Diagnosis Cancer of right colon (HCC)   03/13/2015 -  Chemotherapy Xeloda 1500 mg bid X 14 days on-7 off    HISTORY OF PRESENTING ILLNESS (02/05/2015):  Beth Wilkins 75 y.o. Wilkins  with past medical history of moderate dementia, hypertension, is here because of recently diagnosed stage II colon cancer. She is accompanied to the clinic by her daughter. History of manic is from the chart and her daughter.  She has had abdominal discomfort, nausea and vomiting, and norexia for  the past to 3 months along with 15 pounds weight loss, . Her nausea was mild and intermittent, she does not seek medical attention initially. She presented with  worsening symptoms for 2-3 weeks, and episodes of projectile vomiting and dehydration to emergency room, was admitted to [facility] on 01/20/2015. CT scan reviewed small bowel obstruction, and possible appendiceal rupture. She was brought to OR on 01/23/2015, underwent right hemicolectomy with anastomosis by Dr. Daphine Deutscher. Surgical path reviewed adenocarcinoma at the cecum. She was discharged home on 01/26/2015.  She has recovered well, eating much better than prior to surgery, move around without restriction. No pain, she has loose BM 3 times a day, and she has backed off her mirealax.  she lives with her daughter. She states her appetite is moderate, sometime low, and she refuses to eat if she doesn't have appetite. No nausea, vomiting, abdominal bloating since her surgery.  Her last colonoscopy was 6-8 years ago, and her stool OB was negative this year.   CURRENT THERAPY: Adjuvant chemotherapy with Capecitabine 1500 milligram, every 12 hours, 2 weeks on, and one week off, started on 03/13/15, plan for a total of 8 cycles   INTERIM HISTORY: Beth Wilkins returns for follow-up. She is accompanied by her daughter to clinic today. She has completed the second cycle chemotherapy last week, and overall tolerated very well. She has mild fatigue, 1 episode of diarrhea, no nausea, and mild taste change, no other side effects. Her appetite is good, and she eats well. Her weight is stable.   MEDICAL HISTORY:  Past Medical History  Diagnosis Date  . GERD (gastroesophageal reflux disease)   . Hypertension   . Hypercholesteremia   . Pelvis fracture (HCC) 01/31/2013  . Dementia     SURGICAL HISTORY: Past Surgical History  Procedure Laterality Date  . Cataract extraction,  bilateral    . Laparoscopy N/A 01/23/2015    Procedure: DIAGNOSTIC LAPAROSCOPY, EXPLORATORY LAPAROTOMY  RIGHT HEMI-COLECTOMY FOR OBSTRUCTION OF RIGHT TERMINAL ILEUM;  Surgeon: [name], MD;  Location: [facility];  Service: General;  Laterality: N/A;    SOCIAL HISTORY: Social History   Social History  . Marital Status: Legally Separated    Spouse Name: N/A  .  Number of Children: N/A  . Years of Education: N/A   Occupational History  . Not on file.   Social History Main Topics  . Smoking status: Former Smoker -- 0.25 packs/day for 50 years    Types: Cigarettes    Quit date: 01/25/2015  . Smokeless tobacco: Never Used     Comment: smokes occasionally  . Alcohol Use: 0.0 oz/week    0 Standard drinks or equivalent per week     Comment: occasional   . Drug Use: No  . Sexual Activity: Not on file   Other Topics Concern  . Not on file   Social History Narrative   Legally separated   Lives w/daughter, Beth Wilkins   Has total of #2 daughters and #1 son   Dementia   Fall Risk-ambulates w/cane    FAMILY HISTORY: Family History  Problem Relation Age of Onset  . Diabetes Mellitus II Father   . Cervical cancer Other   . Cancer Mother 58    ovarian cancer     ALLERGIES:  is allergic to namenda and oxycodone.  MEDICATIONS:  Current Outpatient Prescriptions  Medication Sig Dispense Refill  . acetaminophen (TYLENOL) 325 MG tablet Take 2 tablets (650 mg total) by mouth every 6 (six) hours as needed for mild pain (or Fever >/= 101).    . capecitabine (XELODA) 500 MG tablet Take 3 tablets (1,500 mg total) by mouth 2 (two) times daily after a meal. 84 tablet 3  . Cholecalciferol (VITAMIN D3) 2000 UNITS TABS Take by mouth daily.    . Coenzyme Q10 (CO Q 10) 100 MG CAPS Take by mouth daily.    Marland Kitchen donepezil (ARICEPT) 10 MG tablet Take 1 tab daily 30 tablet 11  . esomeprazole (NEXIUM) 40 MG capsule Take 40 mg by mouth as needed.     . MULTIPLE VITAMIN PO Take by mouth daily. Take Geritol - MVI with Iron.  Take 1 Tbsp daily.    . potassium chloride SA (K-DUR,KLOR-CON) 20 MEQ tablet Take 20 mEq by mouth daily.    . simvastatin (ZOCOR) 20 MG tablet Take 20 mg by mouth daily at 6 PM.    . triamterene-hydrochlorothiazide (MAXZIDE-25) 37.5-25 MG per tablet Take 1 tablet by mouth daily.    . polyethylene glycol (MIRALAX / GLYCOLAX) packet Take 17 g  by mouth daily. (Patient not taking: Reported on 04/21/2015) 14 each 0   No current facility-administered medications for this visit.    REVIEW OF SYSTEMS:   Constitutional: Denies fevers, chills or abnormal night sweats Eyes: Denies blurriness of vision, double vision or watery eyes Ears, nose, mouth, throat, and face: Denies mucositis or sore throat Respiratory: Denies cough, dyspnea or wheezes Cardiovascular: Denies palpitation, chest discomfort or lower extremity swelling Gastrointestinal:  Denies nausea, heartburn or change in bowel habits Skin: Denies abnormal skin rashes Lymphatics: Denies new lymphadenopathy or easy bruising Neurological:Denies numbness, tingling or new weaknesses Behavioral/Psych: Mood is stable, no new changes  All other systems were reviewed with the patient and are negative.  PHYSICAL EXAMINATION: ECOG PERFORMANCE STATUS: 1 - Symptomatic but completely ambulatory  Filed Vitals:  04/21/15 1412 04/21/15 1448  BP: 161/74 144/75  Pulse: 62 63  Temp: 97.9 F (36.6 C)   Resp: 18    Filed Weights   04/21/15 1412  Weight: 119 lb 12.8 oz (54.341 kg)    GENERAL:alert, no distress and comfortable SKIN: skin color, texture, turgor are normal, no rashes or significant lesions EYES: normal, conjunctiva are pink and non-injected, sclera clear OROPHARYNX:no exudate, no erythema and lips, buccal mucosa, and tongue normal  NECK: supple, thyroid normal size, non-tender, without nodularity LYMPH:  no palpable lymphadenopathy in the cervical, axillary or inguinal LUNGS: clear to auscultation and percussion with normal breathing effort HEART: regular rate & rhythm and no murmurs and no lower extremity edema ABDOMEN:abdomen soft, non-tender and normal bowel sounds Musculoskeletal:no cyanosis of digits and no clubbing  PSYCH: alert & oriented x 3 with fluent speech NEURO: no focal motor/sensory deficits  LABORATORY DATA:  I have reviewed the data as listed CBC  Latest Ref Rng 04/21/2015 03/31/2015 03/23/2015  WBC 3.9 - 10.3 10e3/uL 6.2 6.7 5.8  Hemoglobin 11.6 - 15.9 g/dL 11.0(L) 10.4(L) 10.6(L)  Hematocrit 34.8 - 46.6 % 32.9(L) 30.6(L) 32.0(L)  Platelets 145 - 400 10e3/uL 239 249 282    CMP Latest Ref Rng 04/21/2015 03/31/2015 03/23/2015  Glucose 70 - 140 mg/dl 92 89 94  BUN 7.0 - 26.0 mg/dL 22.3 22.2 17.7  Creatinine 0.6 - 1.1 mg/dL 1.4(H) 1.5(H) 1.2(H)  Sodium 136 - 145 mEq/L 143 142 142  Potassium 3.5 - 5.1 mEq/L 3.6 3.8 3.9  CO2 22 - 29 mEq/L '25 28 25  '$ Calcium 8.4 - 10.4 mg/dL 9.6 9.2 9.2  Total Protein 6.4 - 8.3 g/dL 7.6 7.4 7.2  Total Bilirubin 0.20 - 1.20 mg/dL 1.53(H) 0.87 1.32(H)  Alkaline Phos 40 - 150 U/L 60 63 64  AST 5 - 34 U/L '19 17 16  '$ ALT 0 - 55 U/L 10 <9 <9     PATHOLOGY REPORT: Diagnosis 01/23/2015 Colon, segmental resection, with terminal ileum and cecum - INVASIVE ADENOCARCINOMA EXTENDING INTO PERICECAL CONNECTIVE TISSUE AND INVOLVING SMALL INTESTINE. - THREE BENIGN LYMPH NODES (0/3). - SEE ONCOLOGY TABLE BELOW.  Microscopic Comment COLON AND RECTUM (INCLUDING TRANS-ANAL RESECTION): Specimen: Terminal ileum and cecum. Procedure: Resection. Tumor site: Cecum adjacent to appendix. Specimen integrity: Intact. Macroscopic intactness of mesorectum: Not applicable. Macroscopic tumor perforation: No. Invasive tumor: Maximum size: 4.0 cm. Histologic type(s): Colorectal adenocarcinoma. Histologic grade and differentiation: G2: moderately differentiated/low grade. Type of polyp in which invasive carcinoma arose: No residual polyp. Microscopic extension of invasive tumor: Into pericecal connective tissue and terminal ileum. Lymph-Vascular invasion: Not identified. Peri-neural invasion: Not identified. Tumor deposit(s) (discontinuous extramural extension): No. Resection margins: Proximal margin: Free of tumor. Distal margin: Free of tumor. Circumferential (radial) (posterior ascending, posterior descending; lateral and  posterior mid-rectum; and entire lower 1/3 rectum): Free of tumor. Mesenteric margin (sigmoid and transverse): N/A. Distance closest margin (if all above margins negative): N/A. Treatment effect (neo-adjuvant therapy): No. Additional polyp(s): No. Non-neoplastic findings: N/A. Lymph nodes: number examined 3; number positive: 0. Pathologic Staging: pT4b, pN0, pMX. Ancillary studies: Mismatch repair protein by immunohistochemistry pending. Comments: There is a colorectal-type adenocarcinoma arising in the cecum in the area of the appendiceal orifice. The tumor extends into the pericecal connective tissue, and involves the attached segment of terminal ileum. Immunohistochemistry shows the tumor is positive with CDX2 and cytokeratin 20, and negative with cytokeratin 7, estrogen receptor and WT-1 supporting a diagnosis of primary colorectal adenocarcinoma. ADDITIONAL INFORMATION: Mismatch Repair (MMR) Protein  Immunohistochemistry (IHC) IHC Expression Result: MLH1: Preserved nuclear expression (greater 50% tumor expression) MSH2: Preserved nuclear expression (greater 50% tumor expression) MSH6: Preserved nuclear expression (greater 50% tumor expression) PMS2: Preserved nuclear expression (greater 50% tumor expression) * Internal control demonstrates intact nuclear expression Interpretation: NORMAL There is preserved expression of the major and minor MMR proteins. There is a very low probability that microsatellite instability (MSI) is present. However, certain clinically significant MMR protein mutations may result in preservation of nuclear expression. It is recommended that the preservation of protein expression be correlated with molecular based MSI testing.  RADIOGRAPHIC STUDIES: I have personally reviewed the radiological images as listed and agreed with the findings in the report.  Ct Abdomen Pelvis Wo Contrast  01/21/2015  CLINICAL DATA:  Abdominal distention for 4 days with nausea  and vomiting. Decreased urine output. EXAM: CT ABDOMEN AND PELVIS WITHOUT CONTRAST TECHNIQUE: Multidetector CT imaging of the abdomen and pelvis was performed following the standard protocol without IV contrast. COMPARISON:  None. FINDINGS: Lower chest:  The included lung bases are clear. Liver: Ladder density lesion in the anterior subcapsular left lobe measures 3.6 cm and is consistent with simple cyst. Additional smaller hypodense lesions in the left and anterior right lobe are incompletely characterized without contrast. Hepatobiliary: Gallbladder physiologically distended, displaced laterally by dilated stomach. No calcified gallstone. Pancreas: Normal.  No ductal dilatation or surrounding inflammation. Spleen: Small soft tissue density in the left upper quadrant, likely diminutive spleen. Adrenal glands: No nodule. Kidneys: Multiple bilateral low-density lesions in both kidneys consistent with cysts. Mild right hydroureteronephrosis, distal ureter is suboptimally defined. There is an nonobstructing stone in the lower right kidney. No ureteral calculi. No left hydronephrosis. Stomach/Bowel: Stomach markedly distended with fluid. Diffuse fluid-filled dilated small bowel, measuring up to 4.1 cm. Transition point suspected in the right lower quadrant, however no definite decompressed small bowel is seen. No pneumatosis. Small volume of stool throughout the colon without colonic wall thickening. The ileocecal valve not well-defined. The appendix is not definitively seen. Vascular/Lymphatic: No retroperitoneal adenopathy. Abdominal aorta is normal in caliber. Dense atherosclerosis of the abdominal aorta and its branches. Reproductive: Uterus appears in situ, suboptimally defined due to fluid-filled adjacent bowel loops. Limited assessment for adnexal mass. Bladder: Minimally distended, irregular/tubular in shape. No wall thickening. Displaced posteriorly due to dilated small bowel. Other: Mild mesenteric edema  centrally.  No free air or free fluid. Musculoskeletal: There are no acute or suspicious osseous abnormalities. Advanced facet arthropathy in the lumbar spine. Transitional lumbosacral anatomy. IMPRESSION: 1. High-grade small bowel obstruction with transition point in the right lower quadrant. No definite decompressed small bowel loops are seen, suspect distal ileum a site of obstruction. No perforation, pneumatosis or free fluid. 2. Right hydroureteronephrosis, however no obstructing lesion or stone. There is a nonobstructing stone in the lower right kidney. Multiple bilateral renal cysts. 3. Multiple liver lesions, largest represents a simple cyst. Smaller lesions are incompletely characterized without contrast. Electronically Signed   By: Jeb Levering M.D.   On: 01/21/2015 03:26   Ct Chest W Contrast  01/26/2015  CLINICAL DATA:  Hypoxia.  Bowel obstruction. EXAM: CT CHEST WITH CONTRAST TECHNIQUE: Multidetector CT imaging of the chest was performed during intravenous contrast administration. CONTRAST:  14m OMNIPAQUE IOHEXOL 300 MG/ML  SOLN COMPARISON:  None FINDINGS: Mediastinum: The heart size appears normal. There is no pericardial effusion identified. The trachea is patent and is midline. There is a nasogastric tube within the esophagus. Tip is looped within the stomach. There  is no mediastinal or hilar adenopathy. No enlarged axillary or supraclavicular lymph nodes. Lungs/Pleura: Small left pleural effusion identified. Moderate changes of centrilobular emphysema identified. There is a discs shaped nodule within the right upper lobe which measures 5 mm, image 29 of series 603 and image 16 of series 15. Subsegmental atelectasis noted in the left base. Upper Abdomen: There is a low density structure in the lateral segment of left lobe measuring 7 mm. This is too small to characterize. Renal cysts noted. Musculoskeletal: Mild degenerative disc disease noted within the thoracic spine.  IMPRESSION: 1.  Small left pleural effusion. 2. Right upper lobe lung nodule measures 5 mm. If the patient is at high risk for bronchogenic carcinoma, follow-up chest CT at 6-12 months is recommended. If the patient is at low risk for bronchogenic carcinoma, follow-up chest CT at 12 months is recommended. This recommendation follows the consensus statement: Guidelines for Management of Small Pulmonary Nodules Detected on CT Scans: A Statement from the [employer] as published in Radiology 2005;237:395-400. 3. Emphysema. Electronically Signed   By: Kerby Moors M.D.   On: 01/26/2015 15:51     ASSESSMENT & PLAN: 75 year old African-American Wilkins, with past medical history of moderate dementia, independent with ADLs, hypertension, presents with small bowel obstruction and weight loss.  1. 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 -she has been tolerating chemotherapy capecitabine very well, we'll continue. -Lab results reviewed with her, no significant cytopenia, we'll continue treatment. -she will return in 3 weeks for follow-up before next cycle   2. Hypertension, dementia -Her blood pressure has not been well controlled. Due to her CKD, I will talk to her PCP Dr. Drema Dallas to see if need to change Maxzide to other BP meds  -She will continue follow-up with her primary care physician  3. Anemia - she has a mild normocytic anemia,  Hypo-productive, stable overall  - her lab results of B12, folate, iron and ferritin were normal, TIBC was low,  this is likely anemia of chronic disease. - we'll continue monitoring.  4. CKD stage III -Her Cr was around 1 in 08/2013, Cr increased to 2.4 on 01/20/2015 when she was diagnosed with colon cancer due to dehydration, improved some and has been around 1.2-1.5 lately -will discuss with Dr. Drema Dallas about her BP meds -I encouraged her to drink  more fluids  Plan -lab reviewed with pt and her daughter, Cr 1.4 today, mild anemia stable  -She will start third cycle capecitabine '1500mg'$  q12h on 2/16 -I will see her back in 3 weeks with lab. -will discuss her BP meds with Dr. Drema Dallas   All questions were answered. The patient knows to call the clinic with any problems, questions or concerns. I spent 20 minutes counseling the patient face to face. The total time spent in the appointment was 25 minutes and more than 50% was on counseling.     [name], MD 04/21/2015

## 2015-04-22 LAB — CEA: CEA1: 6.7 ng/mL — AB (ref 0.0–4.7)

## 2015-04-27 ENCOUNTER — Encounter: Payer: Self-pay | Admitting: Neurology

## 2015-04-28 ENCOUNTER — Telehealth: Payer: Self-pay | Admitting: *Deleted

## 2015-04-28 NOTE — Telephone Encounter (Signed)
Called Dr [name] office per Dr Ernestina Penna note to set up f/u re: BP.  Spoke with Shimira & she will let Dr Drema Dallas know.  She states that Dr Ernestina Penna OV notes was received.

## 2015-04-30 ENCOUNTER — Telehealth: Payer: Self-pay | Admitting: *Deleted

## 2015-04-30 ENCOUNTER — Encounter: Payer: Self-pay | Admitting: *Deleted

## 2015-04-30 NOTE — Telephone Encounter (Signed)
Received call from Brittany/Dr Barnes/[facility] stating that they have d/c triamterene/HCTZ & KCL & started Norvasc 5 mg daily & will bring pt back in a week to check BP.  Just wanted to make sure that this would not interfere with chemo.  Informed that this should not be a problem with chemo & thanked for the information

## 2015-04-30 NOTE — Telephone Encounter (Signed)
  Oncology Nurse Navigator Documentation  Navigator Location: [facility] (04/30/15 1619) Navigator Encounter Type: Telephone (04/30/15 1619) Telephone: Incoming Call;Medication Assistance (04/30/15 1619) : Daughter called to report that Dr. [name] saw them on 2/22 and instructed them to stop the K+ and triamterence until she speaks with Dr. Burr Medico about her medications. They asked Dr. Drema Dallas office to call Dr. Burr Medico to discuss, but the requested Dr. Burr Medico contact Dr. Drema Dallas herself to discuss it. Daughter is reaching out requesting our office call the PCP for the physicians to talk, since they seem unwilling to initiate the call. Forwarded message to Dr. Burr Medico.

## 2015-05-11 ENCOUNTER — Other Ambulatory Visit: Payer: Self-pay | Admitting: Hematology

## 2015-05-11 ENCOUNTER — Telehealth: Payer: Self-pay | Admitting: Hematology

## 2015-05-11 NOTE — Telephone Encounter (Signed)
I called pt's daughter Lorriane Shire and left a message for her to see if she can bring her mother to see me at 11am for labs and follow-up at 11:30 tomorrow, or later today. She is scheduled to see me tomorrow.  [name]  05/11/2015

## 2015-05-12 ENCOUNTER — Encounter: Payer: Medicare Other | Admitting: Hematology

## 2015-05-12 ENCOUNTER — Other Ambulatory Visit: Payer: Medicare Other

## 2015-05-12 ENCOUNTER — Other Ambulatory Visit (HOSPITAL_BASED_OUTPATIENT_CLINIC_OR_DEPARTMENT_OTHER): Payer: Medicare Other

## 2015-05-12 ENCOUNTER — Telehealth: Payer: Self-pay | Admitting: Hematology

## 2015-05-12 ENCOUNTER — Ambulatory Visit (HOSPITAL_BASED_OUTPATIENT_CLINIC_OR_DEPARTMENT_OTHER): Payer: Medicare Other | Admitting: Hematology

## 2015-05-12 ENCOUNTER — Encounter: Payer: Self-pay | Admitting: Hematology

## 2015-05-12 VITALS — BP 134/51 | HR 73 | Temp 98.5°F | Resp 18 | Ht 65.0 in | Wt 124.5 lb

## 2015-05-12 DIAGNOSIS — C182 Malignant neoplasm of ascending colon: Secondary | ICD-10-CM

## 2015-05-12 DIAGNOSIS — C180 Malignant neoplasm of cecum: Secondary | ICD-10-CM

## 2015-05-12 DIAGNOSIS — I10 Essential (primary) hypertension: Secondary | ICD-10-CM

## 2015-05-12 DIAGNOSIS — F0390 Unspecified dementia without behavioral disturbance: Secondary | ICD-10-CM

## 2015-05-12 DIAGNOSIS — D638 Anemia in other chronic diseases classified elsewhere: Secondary | ICD-10-CM

## 2015-05-12 DIAGNOSIS — D649 Anemia, unspecified: Secondary | ICD-10-CM | POA: Diagnosis not present

## 2015-05-12 DIAGNOSIS — N183 Chronic kidney disease, stage 3 (moderate): Secondary | ICD-10-CM

## 2015-05-12 LAB — CBC WITH DIFFERENTIAL/PLATELET
BASO%: 0.9 % (ref 0.0–2.0)
Basophils Absolute: 0.1 10*3/uL (ref 0.0–0.1)
EOS%: 5.5 % (ref 0.0–7.0)
Eosinophils Absolute: 0.3 10*3/uL (ref 0.0–0.5)
HCT: 33.7 % — ABNORMAL LOW (ref 34.8–46.6)
HGB: 11.4 g/dL — ABNORMAL LOW (ref 11.6–15.9)
LYMPH%: 31.5 % (ref 14.0–49.7)
MCH: 32.1 pg (ref 25.1–34.0)
MCHC: 33.8 g/dL (ref 31.5–36.0)
MCV: 94.9 fL (ref 79.5–101.0)
MONO#: 0.3 10*3/uL (ref 0.1–0.9)
MONO%: 5.7 % (ref 0.0–14.0)
NEUT#: 3.3 10*3/uL (ref 1.5–6.5)
NEUT%: 56.4 % (ref 38.4–76.8)
PLATELETS: 167 10*3/uL (ref 145–400)
RBC: 3.55 10*6/uL — AB (ref 3.70–5.45)
RDW: 20.1 % — ABNORMAL HIGH (ref 11.2–14.5)
WBC: 5.8 10*3/uL (ref 3.9–10.3)
lymph#: 1.8 10*3/uL (ref 0.9–3.3)

## 2015-05-12 LAB — COMPREHENSIVE METABOLIC PANEL
ALT: 8 U/L (ref 0–55)
ANION GAP: 9 meq/L (ref 3–11)
AST: 19 U/L (ref 5–34)
Albumin: 4 g/dL (ref 3.5–5.0)
Alkaline Phosphatase: 54 U/L (ref 40–150)
BUN: 21.6 mg/dL (ref 7.0–26.0)
CHLORIDE: 108 meq/L (ref 98–109)
CO2: 25 meq/L (ref 22–29)
CREATININE: 1.4 mg/dL — AB (ref 0.6–1.1)
Calcium: 9.5 mg/dL (ref 8.4–10.4)
EGFR: 43 mL/min/{1.73_m2} — ABNORMAL LOW (ref >90)
Glucose: 92 mg/dl (ref 70–140)
POTASSIUM: 3.9 meq/L (ref 3.5–5.1)
Sodium: 142 mEq/L (ref 136–145)
Total Bilirubin: 1.91 mg/dL — ABNORMAL HIGH (ref 0.20–1.20)
Total Protein: 7.5 g/dL (ref 6.4–8.3)

## 2015-05-12 NOTE — Telephone Encounter (Signed)
Gave and printed appt shced and avs fo rpt for March  °

## 2015-05-12 NOTE — Progress Notes (Signed)
This encounter was created in error - please disregard.

## 2015-05-12 NOTE — Progress Notes (Signed)
[facility]  Telephone:([phone]) [phone] [phone]) [phone]  Clinic Follow Up Note   Patient Care Team: Juluis Rainier, MD as PCP - General (Family Medicine) Malachy Mood, MD as Consulting Physician (Medical Oncology) Luretha Murphy, MD as Consulting Physician (General Surgery) Beverely Low, MD as Consulting Physician (Orthopedic Surgery) 05/12/2015     CHIEF COMPLAINTS:  Follow up stage II colon cancer  Oncology History   Cancer of right colon [facility])   Staging form: Colon and Rectum, AJCC 7th Edition     Pathologic stage from 01/23/2015: Stage IIC (T4b, N0, cM0) - Signed by Malachy Mood, MD on 02/05/2015       Cancer of right colon (HCC)   01/21/2015 Imaging  CT abdomen and pelvis showed high-grade small bowel obstruction,  right I [place] ureteral nephrosis,  multiple liver cysts.  CT chest was negative for metastatic lesion.   01/23/2015 Pathology Results  invasive adenocarcinoma extending into the pericecal connective tissue,  grade 2, LVI (-),  no perineural invasion, 3 lymph nodes were negative.   01/23/2015 Surgery   terminal ileum and cecum seegmental resection with anastomosis by Dr. Daphine Deutscher   01/23/2015 Initial Diagnosis Cancer of right colon (HCC)   03/13/2015 -  Chemotherapy Xeloda 1500 mg bid X 14 days on-7 off    HISTORY OF PRESENTING ILLNESS (02/05/2015):  Beth Wilkins 75 y.o. female  with past medical history of moderate dementia, hypertension, is here because of recently diagnosed stage II colon cancer. She is accompanied to the clinic by her daughter. History of manic is from the chart and her daughter.  She has had abdominal discomfort, nausea and vomiting, and norexia for  the past to 3 months along with 15 pounds weight loss, . Her nausea was mild and intermittent, she does not seek medical attention initially. She presented with  worsening symptoms for 2-3 weeks, and episodes of projectile vomiting and dehydration to emergency room, was admitted to [facility]  on 01/20/2015. CT scan reviewed small bowel obstruction, and possible appendiceal rupture. She was brought to OR on 01/23/2015, underwent right hemicolectomy with anastomosis by Dr. Daphine Deutscher. Surgical path reviewed adenocarcinoma at the cecum. She was discharged home on 01/26/2015.  She has recovered well, eating much better than prior to surgery, move around without restriction. No pain, she has loose BM 3 times a day, and she has backed off her mirealax.  she lives with her daughter. She states her appetite is moderate, sometime low, and she refuses to eat if she doesn't have appetite. No nausea, vomiting, abdominal bloating since her surgery.  Her last colonoscopy was 6-8 years ago, and her stool OB was negative this year.   CURRENT THERAPY: Adjuvant chemotherapy with Capecitabine 1500 milligram, every 12 hours, 2 weeks on, and one week off, started on 03/13/15, plan for a total of 8 cycles   INTERIM HISTORY: Beth Wilkins returns for follow-up. She is accompanied by her daughter to clinic today. She has started his cycle 4 Xeloda on 05/07/2015. She has been tolerating well, without significant side effects. She denies any nausea, diarrhea, skin rash or other issues. She has started a new blood pressure medication amlodipine last Friday, and her blood pressure is normal today.  MEDICAL HISTORY:  Past Medical History  Diagnosis Date  . GERD (gastroesophageal reflux disease)   . Hypertension   . Hypercholesteremia   . Pelvis fracture (HCC) 01/31/2013  . Dementia     SURGICAL HISTORY: Past Surgical History  Procedure Laterality Date  . Cataract  extraction, bilateral    . Laparoscopy N/A 01/23/2015    Procedure: DIAGNOSTIC LAPAROSCOPY, EXPLORATORY LAPAROTOMY  RIGHT HEMI-COLECTOMY FOR OBSTRUCTION OF RIGHT TERMINAL ILEUM;  Surgeon: [name], MD;  Location: [facility];  Service: General;  Laterality: N/A;    SOCIAL HISTORY: Social History   Social History  . Marital Status: Legally Separated     Spouse Name: N/A  . Number of Children: N/A  . Years of Education: N/A   Occupational History  . Not on file.   Social History Main Topics  . Smoking status: Former Smoker -- 0.25 packs/day for 50 years    Types: Cigarettes    Quit date: 01/25/2015  . Smokeless tobacco: Never Used     Comment: smokes occasionally  . Alcohol Use: 0.0 oz/week    0 Standard drinks or equivalent per week     Comment: occasional   . Drug Use: No  . Sexual Activity: Not on file   Other Topics Concern  . Not on file   Social History Narrative   Legally separated   Lives w/daughter, [place] Canty   Has total of #2 daughters and #1 son   Dementia   Fall Risk-ambulates w/cane    FAMILY HISTORY: Family History  Problem Relation Age of Onset  . Diabetes Mellitus II Father   . Cervical cancer Other   . Cancer Mother 91    ovarian cancer     ALLERGIES:  is allergic to namenda and oxycodone.  MEDICATIONS:  Current Outpatient Prescriptions  Medication Sig Dispense Refill  . acetaminophen (TYLENOL) 325 MG tablet Take 2 tablets (650 mg total) by mouth every 6 (six) hours as needed for mild pain (or Fever >/= 101).    Marland Kitchen amLODipine (NORVASC) 5 MG tablet Take 5 mg by mouth daily.    . capecitabine (XELODA) 500 MG tablet Take 3 tablets (1,500 mg total) by mouth 2 (two) times daily after a meal. 84 tablet 3  . Cholecalciferol (VITAMIN D3) 2000 UNITS TABS Take by mouth daily.    . Coenzyme Q10 (CO Q 10) 100 MG CAPS Take by mouth daily.    Marland Kitchen donepezil (ARICEPT) 10 MG tablet Take 1 tab daily 30 tablet 11  . esomeprazole (NEXIUM) 40 MG capsule Take 40 mg by mouth as needed.     . MULTIPLE VITAMIN PO Take by mouth daily. Take Geritol - MVI with Iron.  Take 1 Tbsp daily.    Marland Kitchen OVER THE COUNTER MEDICATION 1 drop. Takes  5 ml of Iron 10 mg liquid with 1 Alive  Gummy Vitamin  Women's 50+  Daily  .    . polyethylene glycol (MIRALAX / GLYCOLAX) packet Take 17 g by mouth daily. 14 each 0  . simvastatin (ZOCOR)  20 MG tablet Take 20 mg by mouth daily at 6 PM.     No current facility-administered medications for this visit.    REVIEW OF SYSTEMS:   Constitutional: Denies fevers, chills or abnormal night sweats Eyes: Denies blurriness of vision, double vision or watery eyes Ears, nose, mouth, throat, and face: Denies mucositis or sore throat Respiratory: Denies cough, dyspnea or wheezes Cardiovascular: Denies palpitation, chest discomfort or lower extremity swelling Gastrointestinal:  Denies nausea, heartburn or change in bowel habits Skin: Denies abnormal skin rashes Lymphatics: Denies new lymphadenopathy or easy bruising Neurological:Denies numbness, tingling or new weaknesses Behavioral/Psych: Mood is stable, no new changes  All other systems were reviewed with the patient and are negative.  PHYSICAL EXAMINATION: ECOG PERFORMANCE STATUS:  1 - Symptomatic but completely ambulatory  Filed Vitals:   05/12/15 1124 05/12/15 1125  BP: 131/117 134/51  Pulse: 73   Temp: 98.5 F (36.9 C)   Resp: 18    Filed Weights   05/12/15 1124  Weight: 124 lb 8 oz (56.473 kg)    GENERAL:alert, no distress and comfortable SKIN: skin color, texture, turgor are normal, no rashes or significant lesions EYES: normal, conjunctiva are pink and non-injected, sclera clear OROPHARYNX:no exudate, no erythema and lips, buccal mucosa, and tongue normal  NECK: supple, thyroid normal size, non-tender, without nodularity LYMPH:  no palpable lymphadenopathy in the cervical, axillary or inguinal LUNGS: clear to auscultation and percussion with normal breathing effort HEART: regular rate & rhythm and no murmurs and no lower extremity edema ABDOMEN:abdomen soft, non-tender and normal bowel sounds Musculoskeletal:no cyanosis of digits and no clubbing  PSYCH: alert & oriented x 3 with fluent speech NEURO: no focal motor/sensory deficits  LABORATORY DATA:  I have reviewed the data as listed CBC Latest Ref Rng 05/12/2015  04/21/2015 03/31/2015  WBC 3.9 - 10.3 10e3/uL 5.8 6.2 6.7  Hemoglobin 11.6 - 15.9 g/dL 11.4(L) 11.0(L) 10.4(L)  Hematocrit 34.8 - 46.6 % 33.7(L) 32.9(L) 30.6(L)  Platelets 145 - 400 10e3/uL 167 239 249    CMP Latest Ref Rng 05/12/2015 04/21/2015 03/31/2015  Glucose 70 - 140 mg/dl 92 92 89  BUN 7.0 - 26.0 mg/dL 21.6 22.3 22.2  Creatinine 0.6 - 1.1 mg/dL 1.4(H) 1.4(H) 1.5(H)  Sodium 136 - 145 mEq/L 142 143 142  Potassium 3.5 - 5.1 mEq/L 3.9 3.6 3.8  CO2 22 - 29 mEq/L _0 Calcium 8.4 - 10.4 mg/dL 9.5 9.6 9.2  Total Protein 6.4 - 8.3 g/dL 7.5 7.6 7.4  Total Bilirubin 0.20 - 1.20 mg/dL 1.91(H) 1.53(H) 0.87  Alkaline Phos 40 - 150 U/L 54 60 63  AST 5 - 34 U/L _1 ALT 0 - 55 U/L 8 10 <9     PATHOLOGY REPORT: Diagnosis 01/23/2015 Colon, segmental resection, with terminal ileum and cecum - INVASIVE ADENOCARCINOMA EXTENDING INTO PERICECAL CONNECTIVE TISSUE AND INVOLVING SMALL INTESTINE. - THREE BENIGN LYMPH NODES (0/3). - SEE ONCOLOGY TABLE BELOW.  Microscopic Comment COLON AND RECTUM (INCLUDING TRANS-ANAL RESECTION): Specimen: Terminal ileum and cecum. Procedure: Resection. Tumor site: Cecum adjacent to appendix. Specimen integrity: Intact. Macroscopic intactness of mesorectum: Not applicable. Macroscopic tumor perforation: No. Invasive tumor: Maximum size: 4.0 cm. Histologic type(s): Colorectal adenocarcinoma. Histologic grade and differentiation: G2: moderately differentiated/low grade. Type of polyp in which invasive carcinoma arose: No residual polyp. Microscopic extension of invasive tumor: Into pericecal connective tissue and terminal ileum. Lymph-Vascular invasion: Not identified. Peri-neural invasion: Not identified. Tumor deposit(s) (discontinuous extramural extension): No. Resection margins: Proximal margin: Free of tumor. Distal margin: Free of tumor. Circumferential (radial) (posterior ascending, posterior descending; lateral and posterior mid-rectum; and  entire lower 1/3 rectum): Free of tumor. Mesenteric margin (sigmoid and transverse): N/A. Distance closest margin (if all above margins negative): N/A. Treatment effect (neo-adjuvant therapy): No. Additional polyp(s): No. Non-neoplastic findings: N/A. Lymph nodes: number examined 3; number positive: 0. Pathologic Staging: pT4b, pN0, pMX. Ancillary studies: Mismatch repair protein by immunohistochemistry pending. Comments: There is a colorectal-type adenocarcinoma arising in the cecum in the area of the appendiceal orifice. The tumor extends into the pericecal connective tissue, and involves the attached segment of terminal ileum. Immunohistochemistry shows the tumor is positive with CDX2 and cytokeratin 20, and negative with cytokeratin 7, estrogen receptor and WT-1 supporting a  diagnosis of primary colorectal adenocarcinoma. ADDITIONAL INFORMATION: Mismatch Repair (MMR) Protein Immunohistochemistry (IHC) IHC Expression Result: MLH1: Preserved nuclear expression (greater 50% tumor expression) MSH2: Preserved nuclear expression (greater 50% tumor expression) MSH6: Preserved nuclear expression (greater 50% tumor expression) PMS2: Preserved nuclear expression (greater 50% tumor expression) * Internal control demonstrates intact nuclear expression Interpretation: NORMAL There is preserved expression of the major and minor MMR proteins. There is a very low probability that microsatellite instability (MSI) is present. However, certain clinically significant MMR protein mutations may result in preservation of nuclear expression. It is recommended that the preservation of protein expression be correlated with molecular based MSI testing.  RADIOGRAPHIC STUDIES: I have personally reviewed the radiological images as listed and agreed with the findings in the report.  Ct Abdomen Pelvis Wo Contrast  01/21/2015  CLINICAL DATA:  Abdominal distention for 4 days with nausea and vomiting. Decreased  urine output. EXAM: CT ABDOMEN AND PELVIS WITHOUT CONTRAST TECHNIQUE: Multidetector CT imaging of the abdomen and pelvis was performed following the standard protocol without IV contrast. COMPARISON:  None. FINDINGS: Lower chest:  The included lung bases are clear. Liver: Ladder density lesion in the anterior subcapsular left lobe measures 3.6 cm and is consistent with simple cyst. Additional smaller hypodense lesions in the left and anterior right lobe are incompletely characterized without contrast. Hepatobiliary: Gallbladder physiologically distended, displaced laterally by dilated stomach. No calcified gallstone. Pancreas: Normal.  No ductal dilatation or surrounding inflammation. Spleen: Small soft tissue density in the left upper quadrant, likely diminutive spleen. Adrenal glands: No nodule. Kidneys: Multiple bilateral low-density lesions in both kidneys consistent with cysts. Mild right hydroureteronephrosis, distal ureter is suboptimally defined. There is an nonobstructing stone in the lower right kidney. No ureteral calculi. No left hydronephrosis. Stomach/Bowel: Stomach markedly distended with fluid. Diffuse fluid-filled dilated small bowel, measuring up to 4.1 cm. Transition point suspected in the right lower quadrant, however no definite decompressed small bowel is seen. No pneumatosis. Small volume of stool throughout the colon without colonic wall thickening. The ileocecal valve not well-defined. The appendix is not definitively seen. Vascular/Lymphatic: No retroperitoneal adenopathy. Abdominal aorta is normal in caliber. Dense atherosclerosis of the abdominal aorta and its branches. Reproductive: Uterus appears in situ, suboptimally defined due to fluid-filled adjacent bowel loops. Limited assessment for adnexal mass. Bladder: Minimally distended, irregular/tubular in shape. No wall thickening. Displaced posteriorly due to dilated small bowel. Other: Mild mesenteric edema centrally.  No free air or  free fluid. Musculoskeletal: There are no acute or suspicious osseous abnormalities. Advanced facet arthropathy in the lumbar spine. Transitional lumbosacral anatomy. IMPRESSION: 1. High-grade small bowel obstruction with transition point in the right lower quadrant. No definite decompressed small bowel loops are seen, suspect distal ileum a site of obstruction. No perforation, pneumatosis or free fluid. 2. Right hydroureteronephrosis, however no obstructing lesion or stone. There is a nonobstructing stone in the lower right kidney. Multiple bilateral renal cysts. 3. Multiple liver lesions, largest represents a simple cyst. Smaller lesions are incompletely characterized without contrast. Electronically Signed   By: Jeb Levering M.D.   On: 01/21/2015 03:26   Ct Chest W Contrast  01/26/2015  CLINICAL DATA:  Hypoxia.  Bowel obstruction. EXAM: CT CHEST WITH CONTRAST TECHNIQUE: Multidetector CT imaging of the chest was performed during intravenous contrast administration. CONTRAST:  72m OMNIPAQUE IOHEXOL 300 MG/ML  SOLN COMPARISON:  None FINDINGS: Mediastinum: The heart size appears normal. There is no pericardial effusion identified. The trachea is patent and is midline. There is a nasogastric  tube within the esophagus. Tip is looped within the stomach. There is no mediastinal or hilar adenopathy. No enlarged axillary or supraclavicular lymph nodes. Lungs/Pleura: Small left pleural effusion identified. Moderate changes of centrilobular emphysema identified. There is a discs shaped nodule within the right upper lobe which measures 5 mm, image 29 of series 603 and image 16 of series 15. Subsegmental atelectasis noted in the left base. Upper Abdomen: There is a low density structure in the lateral segment of left lobe measuring 7 mm. This is too small to characterize. Renal cysts noted. Musculoskeletal: Mild degenerative disc disease noted within the thoracic spine.  IMPRESSION: 1. Small left pleural effusion. 2.  Right upper lobe lung nodule measures 5 mm. If the patient is at high risk for bronchogenic carcinoma, follow-up chest CT at 6-12 months is recommended. If the patient is at low risk for bronchogenic carcinoma, follow-up chest CT at 12 months is recommended. This recommendation follows the consensus statement: Guidelines for Management of Small Pulmonary Nodules Detected on CT Scans: A Statement from the [employer] as published in Radiology 2005;237:395-400. 3. Emphysema. Electronically Signed   By: Kerby Moors M.D.   On: 01/26/2015 15:51     ASSESSMENT & PLAN: 75 year old African-American female, with past medical history of moderate dementia, independent with ADLs, hypertension, presents with small bowel obstruction and weight loss.  1. 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 -she has been tolerating chemotherapy capecitabine very well, we'll continue. -Lab results reviewed with her, she has slightly worsening hyperbilirubinemia, probably related to Xeloda, we'll continue monitoring closely. If does not come down before next cycle, I'll hold next cycle Xeloda.  2. Hypertension, dementia -Her blood pressure has not been well controlled. Due to her CKD, her PCP Dr. Drema Dallas has changed her Maxzide to amlodipine.  -She will continue follow-up with her primary care physician  3. Anemia - she has a mild normocytic anemia,  Hypo-productive, stable overall  - her lab results of B12, folate, iron and ferritin were normal, TIBC was low,  this is likely anemia of chronic disease. - we'll continue monitoring.  4. CKD stage III -Her Cr was around 1 in 08/2013, Cr increased to 2.4 on 01/20/2015 when she was diagnosed with colon cancer due to dehydration, improved some and has been around 1.2-1.5 lately -her PCP has changed her Maxzide to amlodipine -I encouraged her  to drink more fluids  5. Hyperbilirubinemia -New since she started chemotherapy, likely related to Xeloda -Due to bilirubin 1.9 today, we'll continue Xeloda for now, and repeat lab before next cycle.  Plan -continue cycle 3 Xeloda, she'll finish on March 15 -Return to clinic on March 21 for repeat lab, if bilirubin does not improve, we'll consider hold next cycle Xeloda.  All questions were answered. The patient knows to call the clinic with any problems, questions or concerns. I spent 20 minutes counseling the patient face to face. The total time spent in the appointment was 25 minutes and more than 50% was on counseling.     [name], MD 05/12/2015

## 2015-05-14 ENCOUNTER — Encounter: Payer: Self-pay | Admitting: Pharmacist

## 2015-05-14 NOTE — Progress Notes (Signed)
Oral Chemotherapy Follow-Up Form  Original Start date of oral chemotherapy: _1/6/17_   Called patient today to follow up regarding patient's oral chemotherapy medication: _Xeloda__  Pt is doing well today. Spoke with Daughter, Lorriane Shire. Pt has dementia and takes aricept for this. Compliance will be important to monitor closely in Ms. Hoffer due to this. Her daughter helps Ms. Reller take her medications and make sure she is compliant but states that patient does not always want to take her medication. She missed 1 dose earlier this week and it seems that Ms. Tijerino forgets to take or misses a dose about once very few weeks. No side effects to report. Ready to start cycle 4 this week. Ms. Lamberg saw Dr. Burr Medico earlier this week  Pt reports _1___ doses missed in the last week.  Missed dose(s) attributed to: ___pt forgot______  Pt reports the following side effects: __none____  Other Issues: _Dementia___   Will follow up and call patient again in _4 weeks___   Thank you,  Montel Clock, PharmD, [facility]

## 2015-05-26 ENCOUNTER — Encounter: Payer: Self-pay | Admitting: Hematology

## 2015-05-26 ENCOUNTER — Ambulatory Visit (HOSPITAL_BASED_OUTPATIENT_CLINIC_OR_DEPARTMENT_OTHER): Payer: Medicare Other | Admitting: Hematology

## 2015-05-26 ENCOUNTER — Other Ambulatory Visit (HOSPITAL_BASED_OUTPATIENT_CLINIC_OR_DEPARTMENT_OTHER): Payer: Medicare Other

## 2015-05-26 ENCOUNTER — Telehealth: Payer: Self-pay | Admitting: Hematology

## 2015-05-26 VITALS — BP 155/76 | HR 68 | Temp 97.5°F | Resp 17 | Ht 65.0 in | Wt 125.8 lb

## 2015-05-26 DIAGNOSIS — D649 Anemia, unspecified: Secondary | ICD-10-CM | POA: Diagnosis not present

## 2015-05-26 DIAGNOSIS — N183 Chronic kidney disease, stage 3 (moderate): Secondary | ICD-10-CM | POA: Diagnosis not present

## 2015-05-26 DIAGNOSIS — F0390 Unspecified dementia without behavioral disturbance: Secondary | ICD-10-CM

## 2015-05-26 DIAGNOSIS — I10 Essential (primary) hypertension: Secondary | ICD-10-CM

## 2015-05-26 DIAGNOSIS — C180 Malignant neoplasm of cecum: Secondary | ICD-10-CM | POA: Diagnosis not present

## 2015-05-26 DIAGNOSIS — C182 Malignant neoplasm of ascending colon: Secondary | ICD-10-CM

## 2015-05-26 DIAGNOSIS — D638 Anemia in other chronic diseases classified elsewhere: Secondary | ICD-10-CM

## 2015-05-26 LAB — COMPREHENSIVE METABOLIC PANEL
ALBUMIN: 3.9 g/dL (ref 3.5–5.0)
ALK PHOS: 62 U/L (ref 40–150)
ALT: <9 U/L (ref 0–55)
AST: 16 U/L (ref 5–34)
Anion Gap: 7 mEq/L (ref 3–11)
BILIRUBIN TOTAL: 1.05 mg/dL (ref 0.20–1.20)
BUN: 12.5 mg/dL (ref 7.0–26.0)
CALCIUM: 9.3 mg/dL (ref 8.4–10.4)
CO2: 27 mEq/L (ref 22–29)
CREATININE: 1.3 mg/dL — AB (ref 0.6–1.1)
Chloride: 107 mEq/L (ref 98–109)
EGFR: 45 mL/min/{1.73_m2} — ABNORMAL LOW (ref >90)
GLUCOSE: 100 mg/dL (ref 70–140)
POTASSIUM: 3.5 meq/L (ref 3.5–5.1)
SODIUM: 141 meq/L (ref 136–145)
TOTAL PROTEIN: 7.6 g/dL (ref 6.4–8.3)

## 2015-05-26 LAB — CBC WITH DIFFERENTIAL/PLATELET
BASO%: 0.6 % (ref 0.0–2.0)
BASOS ABS: 0 10*3/uL (ref 0.0–0.1)
EOS%: 4.9 % (ref 0.0–7.0)
Eosinophils Absolute: 0.3 10*3/uL (ref 0.0–0.5)
HEMATOCRIT: 33.1 % — AB (ref 34.8–46.6)
HEMOGLOBIN: 11.4 g/dL — AB (ref 11.6–15.9)
LYMPH#: 1.7 10*3/uL (ref 0.9–3.3)
LYMPH%: 25.6 % (ref 14.0–49.7)
MCH: 32.5 pg (ref 25.1–34.0)
MCHC: 34.4 g/dL (ref 31.5–36.0)
MCV: 94.3 fL (ref 79.5–101.0)
MONO#: 0.6 10*3/uL (ref 0.1–0.9)
MONO%: 9.4 % (ref 0.0–14.0)
NEUT%: 59.5 % (ref 38.4–76.8)
NEUTROS ABS: 4.1 10*3/uL (ref 1.5–6.5)
Platelets: 201 10*3/uL (ref 145–400)
RBC: 3.51 10*6/uL — ABNORMAL LOW (ref 3.70–5.45)
RDW: 19.1 % — ABNORMAL HIGH (ref 11.2–14.5)
WBC: 6.8 10*3/uL (ref 3.9–10.3)

## 2015-05-26 NOTE — Progress Notes (Signed)
[place]  [phone] [phone]  Clinic Follow Up Note   Patient Care Team: [name], MD as PCP - General (Family Medicine) [name], MD as Consulting Physician (Medical Oncology) [name], MD as Consulting Physician (General Surgery) [name], MD as Consulting Physician (Orthopedic Surgery) 05/26/2015     CHIEF COMPLAINTS:  Follow up stage II colon cancer  Oncology History   Cancer of right colon [facility])   Staging form: Colon and Rectum, AJCC 7th Edition     Pathologic stage from 01/23/2015: Stage IIC (T4b, N0, cM0) - Signed by [name], MD on 02/05/2015       Cancer of right colon ([place])   01/21/2015 Imaging  CT abdomen and pelvis showed high-grade small bowel obstruction,  right I [place] ureteral nephrosis,  multiple liver cysts.  CT chest was negative for metastatic lesion.   01/23/2015 Pathology Results  invasive adenocarcinoma extending into the pericecal connective tissue,  grade 2, LVI (-),  no perineural invasion, 3 lymph nodes were negative.   01/23/2015 Surgery   terminal ileum and cecum seegmental resection with anastomosis by Dr. Hassell Done   01/23/2015 Initial Diagnosis Cancer of right colon ([place])   03/13/2015 -  Chemotherapy Xeloda 1500 mg bid X 14 days on-7 off    HISTORY OF PRESENTING ILLNESS (02/05/2015):  Beth Wilkins 75 y.o. female  with past medical history of moderate dementia, hypertension, is here because of recently diagnosed stage II colon cancer. She is accompanied to the clinic by her daughter. History of manic is from the chart and her daughter.  She has had abdominal discomfort, nausea and vomiting, and norexia for  the past to 3 months along with 15 pounds weight loss, . Her nausea was mild and intermittent, she does not seek medical attention initially. She presented with  worsening symptoms for 2-3 weeks, and episodes of projectile vomiting and dehydration to emergency room, was admitted to [facility] on 01/20/2015. CT scan reviewed small bowel obstruction, and possible appendiceal rupture. She was brought to OR on 01/23/2015, underwent right hemicolectomy with anastomosis by Dr. Hassell Done. Surgical path reviewed adenocarcinoma at the cecum. She was discharged home on 01/26/2015.  She has recovered well, eating much better than prior to surgery, move around without restriction. No pain, she has loose BM 3 times a day, and she has backed off her mirealax.  she lives with her daughter. She states her appetite is moderate, sometime low, and she refuses to eat if she doesn't have appetite. No nausea, vomiting, abdominal bloating since her surgery.  Her last colonoscopy was 6-8 years ago, and her stool OB was negative this year.   CURRENT THERAPY: Adjuvant chemotherapy with Capecitabine 1500 milligram, every 12 hours, 2 weeks on, and one week off, started on 03/13/15, plan for a total of 8 cycles   INTERIM HISTORY: Beth Wilkins returns for follow-up. She is accompanied by her daughter to clinic today. Her daughter was concerned about her not drinking fluids adequately at home, and stopped her Xeloda from D11 of her last cycle (3rd). She is also off this week. She is doing well overall, denies any nausea, diarrhea, skin rash or other issues. Her appetite and eating has not changed much lately.   MEDICAL HISTORY:  Past Medical History  Diagnosis Date  . GERD (gastroesophageal reflux disease)   . Hypertension   . Hypercholesteremia   . Pelvis fracture ([place]) 01/31/2013  . Dementia     SURGICAL HISTORY: Past Surgical History  Procedure Laterality Date  . Cataract extraction, bilateral    . Laparoscopy N/A 01/23/2015    Procedure: DIAGNOSTIC LAPAROSCOPY, EXPLORATORY LAPAROTOMY  RIGHT HEMI-COLECTOMY FOR OBSTRUCTION OF RIGHT TERMINAL ILEUM;  Surgeon: [name], MD;  Location: [facility];  Service: General;  Laterality: N/A;    SOCIAL HISTORY: Social History   Social History  . Marital Status:  Legally Separated    Spouse Name: N/A  . Number of Children: N/A  . Years of Education: N/A   Occupational History  . Not on file.   Social History Main Topics  . Smoking status: Former Smoker -- 0.25 packs/day for 50 years    Types: Cigarettes    Quit date: 01/25/2015  . Smokeless tobacco: Never Used     Comment: smokes occasionally  . Alcohol Use: 0.0 oz/week    0 Standard drinks or equivalent per week     Comment: occasional   . Drug Use: No  . Sexual Activity: Not on file   Other Topics Concern  . Not on file   Social History Narrative   Legally separated   Lives w/daughter, Janalyn Higby   Has total of #2 daughters and #1 son   Dementia   Fall Risk-ambulates w/cane    FAMILY HISTORY: Family History  Problem Relation Age of Onset  . Diabetes Mellitus II Father   . Cervical cancer Other   . Cancer Mother 92    ovarian cancer     ALLERGIES:  is allergic to namenda and oxycodone.  MEDICATIONS:  Current Outpatient Prescriptions  Medication Sig Dispense Refill  . acetaminophen (TYLENOL) 325 MG tablet Take 2 tablets (650 mg total) by mouth every 6 (six) hours as needed for mild pain (or Fever >/= 101).    Marland Kitchen amLODipine (NORVASC) 5 MG tablet Take 5 mg by mouth daily.    . Cholecalciferol (VITAMIN D3) 2000 UNITS TABS Take by mouth daily.    Marland Kitchen esomeprazole (NEXIUM) 40 MG capsule Take 40 mg by mouth as needed.     . MULTIPLE VITAMIN PO Take by mouth daily. Take Geritol - MVI with Iron.  Take 1 Tbsp daily.    Marland Kitchen OVER THE COUNTER MEDICATION 1 drop. Takes  5 ml of Iron 10 mg liquid with 1 Alive  Gummy Vitamin  Women's 50+  Daily  .    . polyethylene glycol (MIRALAX / GLYCOLAX) packet Take 17 g by mouth daily. 14 each 0  . simvastatin (ZOCOR) 20 MG tablet Take 20 mg by mouth daily at 6 PM.    . capecitabine (XELODA) 500 MG tablet Take 3 tablets (1,500 mg total) by mouth 2 (two) times daily after a meal. (Patient not taking: Reported on 05/26/2015) 84 tablet 3  . Coenzyme  Q10 (CO Q 10) 100 MG CAPS Take by mouth daily.    Marland Kitchen donepezil (ARICEPT) 10 MG tablet Take 1 tab daily 30 tablet 11   No current facility-administered medications for this visit.    REVIEW OF SYSTEMS:   Constitutional: Denies fevers, chills or abnormal night sweats Eyes: Denies blurriness of vision, double vision or watery eyes Ears, nose, mouth, throat, and face: Denies mucositis or sore throat Respiratory: Denies cough, dyspnea or wheezes Cardiovascular: Denies palpitation, chest discomfort or lower extremity swelling Gastrointestinal:  Denies nausea, heartburn or change in bowel habits Skin: Denies abnormal skin rashes Lymphatics: Denies new lymphadenopathy or easy bruising Neurological:Denies numbness, tingling or new weaknesses Behavioral/Psych: Mood is stable, no new changes  All other systems were reviewed  with the patient and are negative.  PHYSICAL EXAMINATION: ECOG PERFORMANCE STATUS: 1 - Symptomatic but completely ambulatory  Filed Vitals:   05/26/15 1523  BP: 155/76  Pulse: 68  Temp: 97.5 F (36.4 C)  Resp: 17   Filed Weights   05/26/15 1523  Weight: 125 lb 12.8 oz (57.063 kg)    GENERAL:alert, no distress and comfortable SKIN: skin color, texture, turgor are normal, no rashes or significant lesions EYES: normal, conjunctiva are pink and non-injected, sclera clear OROPHARYNX:no exudate, no erythema and lips, buccal mucosa, and tongue normal  NECK: supple, thyroid normal size, non-tender, without nodularity LYMPH:  no palpable lymphadenopathy in the cervical, axillary or inguinal LUNGS: clear to auscultation and percussion with normal breathing effort HEART: regular rate & rhythm and no murmurs and no lower extremity edema ABDOMEN:abdomen soft, non-tender and normal bowel sounds Musculoskeletal:no cyanosis of digits and no clubbing  PSYCH: alert & oriented x 3 with fluent speech NEURO: no focal motor/sensory deficits  LABORATORY DATA:  I have reviewed the  data as listed CBC Latest Ref Rng 05/26/2015 05/12/2015 04/21/2015  WBC 3.9 - 10.3 10e3/uL 6.8 5.8 6.2  Hemoglobin 11.6 - 15.9 g/dL 11.4(L) 11.4(L) 11.0(L)  Hematocrit 34.8 - 46.6 % 33.1(L) 33.7(L) 32.9(L)  Platelets 145 - 400 10e3/uL 201 167 239    CMP Latest Ref Rng 05/26/2015 05/12/2015 04/21/2015  Glucose 70 - 140 mg/dl 100 92 92  BUN 7.0 - 26.0 mg/dL 12.5 21.6 22.3  Creatinine 0.6 - 1.1 mg/dL 1.3(H) 1.4(H) 1.4(H)  Sodium 136 - 145 mEq/L 141 142 143  Potassium 3.5 - 5.1 mEq/L 3.5 3.9 3.6  CO2 22 - 29 mEq/L '27 25 25  '$ Calcium 8.4 - 10.4 mg/dL 9.3 9.5 9.6  Total Protein 6.4 - 8.3 g/dL 7.6 7.5 7.6  Total Bilirubin 0.20 - 1.20 mg/dL 1.05 1.91(H) 1.53(H)  Alkaline Phos 40 - 150 U/L 62 54 60  AST 5 - 34 U/L '16 19 19  '$ ALT 0 - 55 U/L '9 8 10     '$ PATHOLOGY REPORT: Diagnosis 01/23/2015 Colon, segmental resection, with terminal ileum and cecum - INVASIVE ADENOCARCINOMA EXTENDING INTO PERICECAL CONNECTIVE TISSUE AND INVOLVING SMALL INTESTINE. - THREE BENIGN LYMPH NODES (0/3). - SEE ONCOLOGY TABLE BELOW.  Microscopic Comment COLON AND RECTUM (INCLUDING TRANS-ANAL RESECTION): Specimen: Terminal ileum and cecum. Procedure: Resection. Tumor site: Cecum adjacent to appendix. Specimen integrity: Intact. Macroscopic intactness of mesorectum: Not applicable. Macroscopic tumor perforation: No. Invasive tumor: Maximum size: 4.0 cm. Histologic type(s): Colorectal adenocarcinoma. Histologic grade and differentiation: G2: moderately differentiated/low grade. Type of polyp in which invasive carcinoma arose: No residual polyp. Microscopic extension of invasive tumor: Into pericecal connective tissue and terminal ileum. Lymph-Vascular invasion: Not identified. Peri-neural invasion: Not identified. Tumor deposit(s) (discontinuous extramural extension): No. Resection margins: Proximal margin: Free of tumor. Distal margin: Free of tumor. Circumferential (radial) (posterior ascending, posterior  descending; lateral and posterior mid-rectum; and entire lower 1/3 rectum): Free of tumor. Mesenteric margin (sigmoid and transverse): N/A. Distance closest margin (if all above margins negative): N/A. Treatment effect (neo-adjuvant therapy): No. Additional polyp(s): No. Non-neoplastic findings: N/A. Lymph nodes: number examined 3; number positive: 0. Pathologic Staging: pT4b, pN0, pMX. Ancillary studies: Mismatch repair protein by immunohistochemistry pending. Comments: There is a colorectal-type adenocarcinoma arising in the cecum in the area of the appendiceal orifice. The tumor extends into the pericecal connective tissue, and involves the attached segment of terminal ileum. Immunohistochemistry shows the tumor is positive with CDX2 and cytokeratin 20, and negative with cytokeratin 7,  estrogen receptor and WT-1 supporting a diagnosis of primary colorectal adenocarcinoma. ADDITIONAL INFORMATION: Mismatch Repair (MMR) Protein Immunohistochemistry (IHC) IHC Expression Result: MLH1: Preserved nuclear expression (greater 50% tumor expression) MSH2: Preserved nuclear expression (greater 50% tumor expression) MSH6: Preserved nuclear expression (greater 50% tumor expression) PMS2: Preserved nuclear expression (greater 50% tumor expression) * Internal control demonstrates intact nuclear expression Interpretation: NORMAL There is preserved expression of the major and minor MMR proteins. There is a very low probability that microsatellite instability (MSI) is present. However, certain clinically significant MMR protein mutations may result in preservation of nuclear expression. It is recommended that the preservation of protein expression be correlated with molecular based MSI testing.  RADIOGRAPHIC STUDIES: I have personally reviewed the radiological images as listed and agreed with the findings in the report.  Ct Abdomen Pelvis Wo Contrast  01/21/2015  CLINICAL DATA:  Abdominal distention  for 4 days with nausea and vomiting. Decreased urine output. EXAM: CT ABDOMEN AND PELVIS WITHOUT CONTRAST TECHNIQUE: Multidetector CT imaging of the abdomen and pelvis was performed following the standard protocol without IV contrast. COMPARISON:  None. FINDINGS: Lower chest:  The included lung bases are clear. Liver: Ladder density lesion in the anterior subcapsular left lobe measures 3.6 cm and is consistent with simple cyst. Additional smaller hypodense lesions in the left and anterior right lobe are incompletely characterized without contrast. Hepatobiliary: Gallbladder physiologically distended, displaced laterally by dilated stomach. No calcified gallstone. Pancreas: Normal.  No ductal dilatation or surrounding inflammation. Spleen: Small soft tissue density in the left upper quadrant, likely diminutive spleen. Adrenal glands: No nodule. Kidneys: Multiple bilateral low-density lesions in both kidneys consistent with cysts. Mild right hydroureteronephrosis, distal ureter is suboptimally defined. There is an nonobstructing stone in the lower right kidney. No ureteral calculi. No left hydronephrosis. Stomach/Bowel: Stomach markedly distended with fluid. Diffuse fluid-filled dilated small bowel, measuring up to 4.1 cm. Transition point suspected in the right lower quadrant, however no definite decompressed small bowel is seen. No pneumatosis. Small volume of stool throughout the colon without colonic wall thickening. The ileocecal valve not well-defined. The appendix is not definitively seen. Vascular/Lymphatic: No retroperitoneal adenopathy. Abdominal aorta is normal in caliber. Dense atherosclerosis of the abdominal aorta and its branches. Reproductive: Uterus appears in situ, suboptimally defined due to fluid-filled adjacent bowel loops. Limited assessment for adnexal mass. Bladder: Minimally distended, irregular/tubular in shape. No wall thickening. Displaced posteriorly due to dilated small bowel. Other: Mild  mesenteric edema centrally.  No free air or free fluid. Musculoskeletal: There are no acute or suspicious osseous abnormalities. Advanced facet arthropathy in the lumbar spine. Transitional lumbosacral anatomy. IMPRESSION: 1. High-grade small bowel obstruction with transition point in the right lower quadrant. No definite decompressed small bowel loops are seen, suspect distal ileum a site of obstruction. No perforation, pneumatosis or free fluid. 2. Right hydroureteronephrosis, however no obstructing lesion or stone. There is a nonobstructing stone in the lower right kidney. Multiple bilateral renal cysts. 3. Multiple liver lesions, largest represents a simple cyst. Smaller lesions are incompletely characterized without contrast. Electronically Signed   By: Jeb Levering M.D.   On: 01/21/2015 03:26   Ct Chest W Contrast  01/26/2015  CLINICAL DATA:  Hypoxia.  Bowel obstruction. EXAM: CT CHEST WITH CONTRAST TECHNIQUE: Multidetector CT imaging of the chest was performed during intravenous contrast administration. CONTRAST:  16m OMNIPAQUE IOHEXOL 300 MG/ML  SOLN COMPARISON:  None FINDINGS: Mediastinum: The heart size appears normal. There is no pericardial effusion identified. The trachea is patent and  is midline. There is a nasogastric tube within the esophagus. Tip is looped within the stomach. There is no mediastinal or hilar adenopathy. No enlarged axillary or supraclavicular lymph nodes. Lungs/Pleura: Small left pleural effusion identified. Moderate changes of centrilobular emphysema identified. There is a discs shaped nodule within the right upper lobe which measures 5 mm, image 29 of series 603 and image 16 of series 15. Subsegmental atelectasis noted in the left base. Upper Abdomen: There is a low density structure in the lateral segment of left lobe measuring 7 mm. This is too small to characterize. Renal cysts noted. Musculoskeletal: Mild degenerative disc disease noted within the thoracic spine.   IMPRESSION: 1. Small left pleural effusion. 2. Right upper lobe lung nodule measures 5 mm. If the patient is at high risk for bronchogenic carcinoma, follow-up chest CT at 6-12 months is recommended. If the patient is at low risk for bronchogenic carcinoma, follow-up chest CT at 12 months is recommended. This recommendation follows the consensus statement: Guidelines for Management of Small Pulmonary Nodules Detected on CT Scans: A Statement from the [place] as published in Radiology 2005;237:395-400. 3. Emphysema. Electronically Signed   By: Kerby Moors M.D.   On: 01/26/2015 15:51     ASSESSMENT & PLAN: 75 year old African-American female, with past medical history of moderate dementia, independent with ADLs, hypertension, presents with small bowel obstruction and weight loss.  1. 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 -she has been tolerating chemotherapy capecitabine relatively well, has completed a total of 3 cycles (3rd cycle only had 10 days) -Lab results reviewed with her, her hyperbilirubinemia has near resolved, will start next cycle Xeloda in a few days   -Given her age and dementia, I have low threshold to hold her Xeloda if she develops significant side effects.  2. Hypertension, dementia -Her blood pressure has not been well controlled. Due to her CKD, her PCP Dr. Drema Dallas has changed her Maxzide to amlodipine.  -She will continue follow-up with her primary care physician  3. Anemia - she has a mild normocytic anemia,  Hypo-productive, stable overall  - her lab results of B12, folate, iron and ferritin were normal, TIBC was low,  this is likely anemia of chronic disease. - we'll continue monitoring.  4. CKD stage III -Her Cr was around 1 in 08/2013, Cr increased to 2.4 on 01/20/2015 when she was diagnosed with colon cancer due to  dehydration, improved some and has been around 1.2-1.5 lately, slightly improved today, 1.3 -her PCP has changed her Maxzide to amlodipine -I encouraged her to drink more fluids  5. Hyperbilirubinemia -New since she started chemotherapy, likely related to Xeloda -resolved now, 1.05 today   Plan -start cycle 4 Xeloda in 1-2 days  -Return to clinic in 3 weeks for follow up and lab   All questions were answered. The patient knows to call the clinic with any problems, questions or concerns.  I spent 20 minutes counseling the patient face to face. The total time spent in the appointment was 25 minutes and more than 50% was on counseling.     [name], MD 05/26/2015

## 2015-05-26 NOTE — Telephone Encounter (Signed)
Gave and printed appt sched and avs for pt for march and April  °

## 2015-06-05 ENCOUNTER — Other Ambulatory Visit (HOSPITAL_BASED_OUTPATIENT_CLINIC_OR_DEPARTMENT_OTHER): Payer: Medicare Other

## 2015-06-05 DIAGNOSIS — C180 Malignant neoplasm of cecum: Secondary | ICD-10-CM

## 2015-06-05 DIAGNOSIS — C182 Malignant neoplasm of ascending colon: Secondary | ICD-10-CM

## 2015-06-06 LAB — CEA: CEA: 6.1 ng/mL — ABNORMAL HIGH (ref 0.0–4.7)

## 2015-06-15 ENCOUNTER — Other Ambulatory Visit (HOSPITAL_BASED_OUTPATIENT_CLINIC_OR_DEPARTMENT_OTHER): Payer: Medicare Other

## 2015-06-15 ENCOUNTER — Ambulatory Visit (HOSPITAL_BASED_OUTPATIENT_CLINIC_OR_DEPARTMENT_OTHER): Payer: Medicare Other | Admitting: Hematology

## 2015-06-15 ENCOUNTER — Encounter: Payer: Self-pay | Admitting: Hematology

## 2015-06-15 ENCOUNTER — Telehealth: Payer: Self-pay | Admitting: Hematology

## 2015-06-15 VITALS — BP 157/72 | HR 73 | Temp 98.2°F | Resp 18 | Wt 127.8 lb

## 2015-06-15 DIAGNOSIS — C180 Malignant neoplasm of cecum: Secondary | ICD-10-CM | POA: Diagnosis not present

## 2015-06-15 DIAGNOSIS — D638 Anemia in other chronic diseases classified elsewhere: Secondary | ICD-10-CM

## 2015-06-15 DIAGNOSIS — N183 Chronic kidney disease, stage 3 (moderate): Secondary | ICD-10-CM | POA: Diagnosis not present

## 2015-06-15 DIAGNOSIS — C182 Malignant neoplasm of ascending colon: Secondary | ICD-10-CM

## 2015-06-15 DIAGNOSIS — F0390 Unspecified dementia without behavioral disturbance: Secondary | ICD-10-CM | POA: Diagnosis not present

## 2015-06-15 DIAGNOSIS — I10 Essential (primary) hypertension: Secondary | ICD-10-CM

## 2015-06-15 DIAGNOSIS — D649 Anemia, unspecified: Secondary | ICD-10-CM | POA: Diagnosis not present

## 2015-06-15 LAB — CBC WITH DIFFERENTIAL/PLATELET
BASO%: 1 % (ref 0.0–2.0)
BASOS ABS: 0.1 10*3/uL (ref 0.0–0.1)
EOS ABS: 0.4 10*3/uL (ref 0.0–0.5)
EOS%: 5 % (ref 0.0–7.0)
HEMATOCRIT: 34.2 % — AB (ref 34.8–46.6)
HEMOGLOBIN: 11.5 g/dL — AB (ref 11.6–15.9)
LYMPH#: 1.8 10*3/uL (ref 0.9–3.3)
LYMPH%: 23 % (ref 14.0–49.7)
MCH: 32.1 pg (ref 25.1–34.0)
MCHC: 33.7 g/dL (ref 31.5–36.0)
MCV: 95.1 fL (ref 79.5–101.0)
MONO#: 0.7 10*3/uL (ref 0.1–0.9)
MONO%: 8.6 % (ref 0.0–14.0)
NEUT#: 4.8 10*3/uL (ref 1.5–6.5)
NEUT%: 62.4 % (ref 38.4–76.8)
Platelets: 311 10*3/uL (ref 145–400)
RBC: 3.6 10*6/uL — ABNORMAL LOW (ref 3.70–5.45)
RDW: 19.2 % — AB (ref 11.2–14.5)
WBC: 7.7 10*3/uL (ref 3.9–10.3)

## 2015-06-15 LAB — COMPREHENSIVE METABOLIC PANEL
ALBUMIN: 3.9 g/dL (ref 3.5–5.0)
ALK PHOS: 65 U/L (ref 40–150)
ALT: <9 U/L (ref 0–55)
ANION GAP: 9 meq/L (ref 3–11)
AST: 18 U/L (ref 5–34)
BILIRUBIN TOTAL: 0.77 mg/dL (ref 0.20–1.20)
BUN: 23 mg/dL (ref 7.0–26.0)
CALCIUM: 9.6 mg/dL (ref 8.4–10.4)
CO2: 27 meq/L (ref 22–29)
CREATININE: 1.5 mg/dL — AB (ref 0.6–1.1)
Chloride: 106 mEq/L (ref 98–109)
EGFR: 40 mL/min/{1.73_m2} — AB (ref >90)
Glucose: 85 mg/dl (ref 70–140)
Potassium: 3.7 mEq/L (ref 3.5–5.1)
Sodium: 142 mEq/L (ref 136–145)
Total Protein: 7.9 g/dL (ref 6.4–8.3)

## 2015-06-15 NOTE — Progress Notes (Signed)
[place]  [phone]) [phone] [phone]  Clinic Follow Up Note   Patient Care Team: [name], MD as PCP - General (Family Medicine) [name], MD as Consulting Physician (Medical Oncology) [name], MD as Consulting Physician (General Surgery) [name], MD as Consulting Physician (Orthopedic Surgery) 06/15/2015     CHIEF COMPLAINTS:  Follow up stage II colon cancer  Oncology History   Cancer of right colon [facility])   Staging form: Colon and Rectum, AJCC 7th Edition     Pathologic stage from 01/23/2015: Stage IIC (T4b, N0, cM0) - Signed by [name], MD on 02/05/2015       Cancer of right colon ([place])   01/21/2015 Imaging  CT abdomen and pelvis showed high-grade small bowel obstruction,  right I [place] ureteral nephrosis,  multiple liver cysts.  CT chest was negative for metastatic lesion.   01/23/2015 Pathology Results  invasive adenocarcinoma extending into the pericecal connective tissue,  grade 2, LVI (-),  no perineural invasion, 3 lymph nodes were negative.   01/23/2015 Surgery   terminal ileum and cecum seegmental resection with anastomosis by Dr. Hassell Done   01/23/2015 Initial Diagnosis Cancer of right colon ([place])   03/13/2015 -  Chemotherapy Xeloda 1500 mg bid X 14 days on-7 off    HISTORY OF PRESENTING ILLNESS (02/05/2015):  Beth Wilkins 75 y.o. female  with past medical history of moderate dementia, hypertension, is here because of recently diagnosed stage II colon cancer. She is accompanied to the clinic by her daughter. History of manic is from the chart and her daughter.  She has had abdominal discomfort, nausea and vomiting, and norexia for  the past to 3 months along with 15 pounds weight loss, . Her nausea was mild and intermittent, she does not seek medical attention initially. She presented with  worsening symptoms for 2-3 weeks, and episodes of projectile vomiting and dehydration to emergency room, was admitted to [facility] on 01/20/2015. CT scan reviewed small bowel obstruction, and possible appendiceal rupture. She was brought to OR on 01/23/2015, underwent right hemicolectomy with anastomosis by Dr. Hassell Done. Surgical path reviewed adenocarcinoma at the cecum. She was discharged home on 01/26/2015.  She has recovered well, eating much better than prior to surgery, move around without restriction. No pain, she has loose BM 3 times a day, and she has backed off her mirealax.  she lives with her daughter. She states her appetite is moderate, sometime low, and she refuses to eat if she doesn't have appetite. No nausea, vomiting, abdominal bloating since her surgery.  Her last colonoscopy was 6-8 years ago, and her stool OB was negative this year.   CURRENT THERAPY: Adjuvant chemotherapy with Capecitabine 1500 milligram, every 12 hours, 2 weeks on, and one week off, started on 03/13/15, plan for a total of 8 cycles   INTERIM HISTORY: Beth Wilkins returns for follow-up. She is accompanied by her daughter to clinic today. She has completed cycle 4 Xeloda last Tuesday. She tolerated well overall, no nausea, diarrhea, or other new symptoms. She probably missed 3 days of Xeloda during the 2 weeks treatment. She has been drinking more water lately, according to her daughter. No other new complaints.  MEDICAL HISTORY:  Past Medical History  Diagnosis Date  . GERD (gastroesophageal reflux disease)   . Hypertension   . Hypercholesteremia   . Pelvis fracture ([place]) 01/31/2013  . Dementia     SURGICAL HISTORY: Past Surgical History  Procedure Laterality Date  . Cataract  extraction, bilateral    . Laparoscopy N/A 01/23/2015    Procedure: DIAGNOSTIC LAPAROSCOPY, EXPLORATORY LAPAROTOMY  RIGHT HEMI-COLECTOMY FOR OBSTRUCTION OF RIGHT TERMINAL ILEUM;  Surgeon: [name], MD;  Location: [facility];  Service: General;  Laterality: N/A;    SOCIAL HISTORY: Social History   Social History  . Marital Status: Legally Separated     Spouse Name: N/A  . Number of Children: N/A  . Years of Education: N/A   Occupational History  . Not on file.   Social History Main Topics  . Smoking status: Former Smoker -- 0.25 packs/day for 50 years    Types: Cigarettes    Quit date: 01/25/2015  . Smokeless tobacco: Never Used     Comment: smokes occasionally  . Alcohol Use: 0.0 oz/week    0 Standard drinks or equivalent per week     Comment: occasional   . Drug Use: No  . Sexual Activity: Not on file   Other Topics Concern  . Not on file   Social History Narrative   Legally separated   Lives w/daughter, Leanne Sisler   Has total of #2 daughters and #1 son   Dementia   Fall Risk-ambulates w/cane    FAMILY HISTORY: Family History  Problem Relation Age of Onset  . Diabetes Mellitus II Father   . Cervical cancer Other   . Cancer Mother 53    ovarian cancer     ALLERGIES:  is allergic to namenda and oxycodone.  MEDICATIONS:  Current Outpatient Prescriptions  Medication Sig Dispense Refill  . amLODipine (NORVASC) 5 MG tablet Take 5 mg by mouth daily.    . Cholecalciferol (VITAMIN D3) 2000 UNITS TABS Take by mouth daily.    . Coenzyme Q10 (CO Q 10) 100 MG CAPS Take by mouth daily.    Marland Kitchen donepezil (ARICEPT) 10 MG tablet Take 1 tab daily 30 tablet 11  . esomeprazole (NEXIUM) 40 MG capsule Take 40 mg by mouth as needed.     . MULTIPLE VITAMIN PO Take by mouth daily. Take Geritol - MVI with Iron.  Take 1 Tbsp daily.    Marland Kitchen OVER THE COUNTER MEDICATION 1 drop. Takes  5 ml of Iron 10 mg liquid with 1 Alive  Gummy Vitamin  Women's 50+  Daily  .    . polyethylene glycol (MIRALAX / GLYCOLAX) packet Take 17 g by mouth daily. 14 each 0  . simvastatin (ZOCOR) 20 MG tablet Take 20 mg by mouth daily at 6 PM.    . acetaminophen (TYLENOL) 325 MG tablet Take 2 tablets (650 mg total) by mouth every 6 (six) hours as needed for mild pain (or Fever >/= 101). (Patient not taking: Reported on 06/15/2015)    . capecitabine (XELODA) 500 MG  tablet Take 3 tablets (1,500 mg total) by mouth 2 (two) times daily after a meal. (Patient not taking: Reported on 05/26/2015) 84 tablet 3   No current facility-administered medications for this visit.    REVIEW OF SYSTEMS:   Constitutional: Denies fevers, chills or abnormal night sweats Eyes: Denies blurriness of vision, double vision or watery eyes Ears, nose, mouth, throat, and face: Denies mucositis or sore throat Respiratory: Denies cough, dyspnea or wheezes Cardiovascular: Denies palpitation, chest discomfort or lower extremity swelling Gastrointestinal:  Denies nausea, heartburn or change in bowel habits Skin: Denies abnormal skin rashes Lymphatics: Denies new lymphadenopathy or easy bruising Neurological:Denies numbness, tingling or new weaknesses Behavioral/Psych: Mood is stable, no new changes  All other systems were reviewed  with the patient and are negative.  PHYSICAL EXAMINATION: ECOG PERFORMANCE STATUS: 1 - Symptomatic but completely ambulatory  Filed Vitals:   06/15/15 1406  BP: 157/72  Pulse: 73  Temp: 98.2 F (36.8 C)  Resp: 18   Filed Weights   06/15/15 1406  Weight: 127 lb 12.8 oz (57.97 kg)    GENERAL:alert, no distress and comfortable SKIN: skin color, texture, turgor are normal, no rashes or significant lesions EYES: normal, conjunctiva are pink and non-injected, sclera clear OROPHARYNX:no exudate, no erythema and lips, buccal mucosa, and tongue normal  NECK: supple, thyroid normal size, non-tender, without nodularity LYMPH:  no palpable lymphadenopathy in the cervical, axillary or inguinal LUNGS: clear to auscultation and percussion with normal breathing effort HEART: regular rate & rhythm and no murmurs and no lower extremity edema ABDOMEN:abdomen soft, non-tender and normal bowel sounds Musculoskeletal:no cyanosis of digits and no clubbing  PSYCH: alert & oriented x 3 with fluent speech NEURO: no focal motor/sensory deficits  LABORATORY DATA:  I  have reviewed the data as listed CBC Latest Ref Rng 06/15/2015 05/26/2015 05/12/2015  WBC 3.9 - 10.3 10e3/uL 7.7 6.8 5.8  Hemoglobin 11.6 - 15.9 g/dL 11.5(L) 11.4(L) 11.4(L)  Hematocrit 34.8 - 46.6 % 34.2(L) 33.1(L) 33.7(L)  Platelets 145 - 400 10e3/uL 311 201 167    CMP Latest Ref Rng 06/15/2015 05/26/2015 05/12/2015  Glucose 70 - 140 mg/dl 85 100 92  BUN 7.0 - 26.0 mg/dL 23.0 12.5 21.6  Creatinine 0.6 - 1.1 mg/dL 1.5(H) 1.3(H) 1.4(H)  Sodium 136 - 145 mEq/L 142 141 142  Potassium 3.5 - 5.1 mEq/L 3.7 3.5 3.9  CO2 22 - 29 mEq/L '27 27 25  '$ Calcium 8.4 - 10.4 mg/dL 9.6 9.3 9.5  Total Protein 6.4 - 8.3 g/dL 7.9 7.6 7.5  Total Bilirubin 0.20 - 1.20 mg/dL 0.77 1.05 1.91(H)  Alkaline Phos 40 - 150 U/L 65 62 54  AST 5 - 34 U/L '18 16 19  '$ ALT 0 - 55 U/L <9 <9 8     PATHOLOGY REPORT: Diagnosis 01/23/2015 Colon, segmental resection, with terminal ileum and cecum - INVASIVE ADENOCARCINOMA EXTENDING INTO PERICECAL CONNECTIVE TISSUE AND INVOLVING SMALL INTESTINE. - THREE BENIGN LYMPH NODES (0/3). - SEE ONCOLOGY TABLE BELOW.  Microscopic Comment COLON AND RECTUM (INCLUDING TRANS-ANAL RESECTION): Specimen: Terminal ileum and cecum. Procedure: Resection. Tumor site: Cecum adjacent to appendix. Specimen integrity: Intact. Macroscopic intactness of mesorectum: Not applicable. Macroscopic tumor perforation: No. Invasive tumor: Maximum size: 4.0 cm. Histologic type(s): Colorectal adenocarcinoma. Histologic grade and differentiation: G2: moderately differentiated/low grade. Type of polyp in which invasive carcinoma arose: No residual polyp. Microscopic extension of invasive tumor: Into pericecal connective tissue and terminal ileum. Lymph-Vascular invasion: Not identified. Peri-neural invasion: Not identified. Tumor deposit(s) (discontinuous extramural extension): No. Resection margins: Proximal margin: Free of tumor. Distal margin: Free of tumor. Circumferential (radial) (posterior ascending,  posterior descending; lateral and posterior mid-rectum; and entire lower 1/3 rectum): Free of tumor. Mesenteric margin (sigmoid and transverse): N/A. Distance closest margin (if all above margins negative): N/A. Treatment effect (neo-adjuvant therapy): No. Additional polyp(s): No. Non-neoplastic findings: N/A. Lymph nodes: number examined 3; number positive: 0. Pathologic Staging: pT4b, pN0, pMX. Ancillary studies: Mismatch repair protein by immunohistochemistry pending. Comments: There is a colorectal-type adenocarcinoma arising in the cecum in the area of the appendiceal orifice. The tumor extends into the pericecal connective tissue, and involves the attached segment of terminal ileum. Immunohistochemistry shows the tumor is positive with CDX2 and cytokeratin 20, and negative with cytokeratin 7,  estrogen receptor and WT-1 supporting a diagnosis of primary colorectal adenocarcinoma. ADDITIONAL INFORMATION: Mismatch Repair (MMR) Protein Immunohistochemistry (IHC) IHC Expression Result: MLH1: Preserved nuclear expression (greater 50% tumor expression) MSH2: Preserved nuclear expression (greater 50% tumor expression) MSH6: Preserved nuclear expression (greater 50% tumor expression) PMS2: Preserved nuclear expression (greater 50% tumor expression) * Internal control demonstrates intact nuclear expression Interpretation: NORMAL There is preserved expression of the major and minor MMR proteins. There is a very low probability that microsatellite instability (MSI) is present. However, certain clinically significant MMR protein mutations may result in preservation of nuclear expression. It is recommended that the preservation of protein expression be correlated with molecular based MSI testing.  RADIOGRAPHIC STUDIES: I have personally reviewed the radiological images as listed and agreed with the findings in the report.  Ct Abdomen Pelvis Wo Contrast  01/21/2015  CLINICAL DATA:  Abdominal  distention for 4 days with nausea and vomiting. Decreased urine output. EXAM: CT ABDOMEN AND PELVIS WITHOUT CONTRAST TECHNIQUE: Multidetector CT imaging of the abdomen and pelvis was performed following the standard protocol without IV contrast. COMPARISON:  None. FINDINGS: Lower chest:  The included lung bases are clear. Liver: Ladder density lesion in the anterior subcapsular left lobe measures 3.6 cm and is consistent with simple cyst. Additional smaller hypodense lesions in the left and anterior right lobe are incompletely characterized without contrast. Hepatobiliary: Gallbladder physiologically distended, displaced laterally by dilated stomach. No calcified gallstone. Pancreas: Normal.  No ductal dilatation or surrounding inflammation. Spleen: Small soft tissue density in the left upper quadrant, likely diminutive spleen. Adrenal glands: No nodule. Kidneys: Multiple bilateral low-density lesions in both kidneys consistent with cysts. Mild right hydroureteronephrosis, distal ureter is suboptimally defined. There is an nonobstructing stone in the lower right kidney. No ureteral calculi. No left hydronephrosis. Stomach/Bowel: Stomach markedly distended with fluid. Diffuse fluid-filled dilated small bowel, measuring up to 4.1 cm. Transition point suspected in the right lower quadrant, however no definite decompressed small bowel is seen. No pneumatosis. Small volume of stool throughout the colon without colonic wall thickening. The ileocecal valve not well-defined. The appendix is not definitively seen. Vascular/Lymphatic: No retroperitoneal adenopathy. Abdominal aorta is normal in caliber. Dense atherosclerosis of the abdominal aorta and its branches. Reproductive: Uterus appears in situ, suboptimally defined due to fluid-filled adjacent bowel loops. Limited assessment for adnexal mass. Bladder: Minimally distended, irregular/tubular in shape. No wall thickening. Displaced posteriorly due to dilated small bowel.  Other: Mild mesenteric edema centrally.  No free air or free fluid. Musculoskeletal: There are no acute or suspicious osseous abnormalities. Advanced facet arthropathy in the lumbar spine. Transitional lumbosacral anatomy. IMPRESSION: 1. High-grade small bowel obstruction with transition point in the right lower quadrant. No definite decompressed small bowel loops are seen, suspect distal ileum a site of obstruction. No perforation, pneumatosis or free fluid. 2. Right hydroureteronephrosis, however no obstructing lesion or stone. There is a nonobstructing stone in the lower right kidney. Multiple bilateral renal cysts. 3. Multiple liver lesions, largest represents a simple cyst. Smaller lesions are incompletely characterized without contrast. Electronically Signed   By: Jeb Levering M.D.   On: 01/21/2015 03:26   Ct Chest W Contrast  01/26/2015  CLINICAL DATA:  Hypoxia.  Bowel obstruction. EXAM: CT CHEST WITH CONTRAST TECHNIQUE: Multidetector CT imaging of the chest was performed during intravenous contrast administration. CONTRAST:  91m OMNIPAQUE IOHEXOL 300 MG/ML  SOLN COMPARISON:  None FINDINGS: Mediastinum: The heart size appears normal. There is no pericardial effusion identified. The trachea is patent and  is midline. There is a nasogastric tube within the esophagus. Tip is looped within the stomach. There is no mediastinal or hilar adenopathy. No enlarged axillary or supraclavicular lymph nodes. Lungs/Pleura: Small left pleural effusion identified. Moderate changes of centrilobular emphysema identified. There is a discs shaped nodule within the right upper lobe which measures 5 mm, image 29 of series 603 and image 16 of series 15. Subsegmental atelectasis noted in the left base. Upper Abdomen: There is a low density structure in the lateral segment of left lobe measuring 7 mm. This is too small to characterize. Renal cysts noted. Musculoskeletal: Mild degenerative disc disease noted within the thoracic  spine.  IMPRESSION: 1. Small left pleural effusion. 2. Right upper lobe lung nodule measures 5 mm. If the patient is at high risk for bronchogenic carcinoma, follow-up chest CT at 6-12 months is recommended. If the patient is at low risk for bronchogenic carcinoma, follow-up chest CT at 12 months is recommended. This recommendation follows the consensus statement: Guidelines for Management of Small Pulmonary Nodules Detected on CT Scans: A Statement from the [place] as published in Radiology 2005;237:395-400. 3. Emphysema. Electronically Signed   By: Kerby Moors M.D.   On: 01/26/2015 15:51     ASSESSMENT & PLAN: 75 year old African-American female, with past medical history of moderate dementia, independent with ADLs, hypertension, presents with small bowel obstruction and weight loss.  1. 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 -she has been tolerating chemotherapy capecitabine relatively well, has completed a total of 3 cycles (3rd cycle only had 10 days) -Lab results reviewed with her,Cr 1.5 today, overall stable, I will start next cycle Xeloda in 3 days   -Given her age and dementia, I have low threshold to hold her Xeloda if she develops significant side effects.  2. Hypertension, dementia -Her blood pressure has not been well controlled. Due to her CKD, her PCP Dr. Drema Dallas has changed her Maxzide to amlodipine.  -She will continue follow-up with her primary care physician  3. Anemia - she has a mild normocytic anemia,  Hypo-productive, stable overall  - her lab results of B12, folate, iron and ferritin were normal, TIBC was low,  this is likely anemia of chronic disease. - we'll continue monitoring.  4. CKD stage III -Her Cr was around 1 in 08/2013, Cr increased to 2.4 on 01/20/2015 when she was diagnosed with colon cancer due to dehydration,  improved some and has been around 1.2-1.5 lately.  -her PCP has changed her Maxzide to amlodipine -I encouraged her to drink more fluids  5. Hyperbilirubinemia -New since she started chemotherapy, likely related to Xeloda -resolved now.  Plan -start cycle 5 Xeloda in 3 days  -Return to clinic in 3 weeks for follow up and lab   All questions were answered. The patient knows to call the clinic with any problems, questions or concerns.  I spent 20 minutes counseling the patient face to face. The total time spent in the appointment was 25 minutes and more than 50% was on counseling.     [name], MD 06/15/2015

## 2015-06-15 NOTE — Telephone Encounter (Signed)
per pof to sch pt appt-gave pt copy of avs °

## 2015-06-17 MED FILL — CAPECITABINE 500 MG TABLET: 500 | 14 days supply | Qty: 84 | Fill #3

## 2015-06-25 ENCOUNTER — Encounter: Payer: Self-pay | Admitting: Pharmacist

## 2015-06-25 NOTE — Progress Notes (Signed)
Oral Chemotherapy Follow-Up Form  Original Start date of oral chemotherapy: _1/6/17_   Called patient today to follow up regarding patient's oral chemotherapy medication: _Xeloda__  Pt is doing well today. Spoke with Daughter, Lorriane Shire as well as Ms. Warehime. Pt has dementia and takes aricept for this. Compliance will be important to monitor closely in Ms. Hoose due to this. Her daughter helps Ms. Hajjar take her medications and make sure she is compliant but states that patient does not always want to take her medication or forgets to take evening dose (or falls asleep before she can take it). She missed ~ 3 dose earlier this month before starting cycle 5 and it seems that Ms. Broadus forgets to take or misses a dose about once every few weeks. No side effects to report.  Pt reports _3___ doses missed in the last month.  Missed dose(s) attributed to: ___pt forgot______  Pt reports the following side effects: __none____  Other Issues: _Dementia___   Will follow up and call patient again in _4 weeks___   Thank you,  Montel Clock, PharmD, [facility]

## 2015-07-06 ENCOUNTER — Telehealth: Payer: Self-pay | Admitting: Hematology

## 2015-07-06 ENCOUNTER — Other Ambulatory Visit: Payer: Medicare Other

## 2015-07-06 ENCOUNTER — Encounter: Payer: Self-pay | Admitting: Hematology

## 2015-07-06 ENCOUNTER — Ambulatory Visit: Payer: Medicare Other | Admitting: Hematology

## 2015-07-06 ENCOUNTER — Other Ambulatory Visit (HOSPITAL_BASED_OUTPATIENT_CLINIC_OR_DEPARTMENT_OTHER): Payer: Medicare Other

## 2015-07-06 ENCOUNTER — Ambulatory Visit (HOSPITAL_BASED_OUTPATIENT_CLINIC_OR_DEPARTMENT_OTHER): Payer: Medicare Other | Admitting: Hematology

## 2015-07-06 VITALS — BP 154/69 | HR 71 | Temp 98.1°F | Resp 18 | Ht 65.0 in | Wt 125.7 lb

## 2015-07-06 DIAGNOSIS — C182 Malignant neoplasm of ascending colon: Secondary | ICD-10-CM

## 2015-07-06 DIAGNOSIS — I10 Essential (primary) hypertension: Secondary | ICD-10-CM | POA: Diagnosis not present

## 2015-07-06 DIAGNOSIS — D649 Anemia, unspecified: Secondary | ICD-10-CM

## 2015-07-06 DIAGNOSIS — C180 Malignant neoplasm of cecum: Secondary | ICD-10-CM

## 2015-07-06 DIAGNOSIS — F0390 Unspecified dementia without behavioral disturbance: Secondary | ICD-10-CM | POA: Diagnosis not present

## 2015-07-06 DIAGNOSIS — N183 Chronic kidney disease, stage 3 (moderate): Secondary | ICD-10-CM

## 2015-07-06 DIAGNOSIS — D638 Anemia in other chronic diseases classified elsewhere: Secondary | ICD-10-CM

## 2015-07-06 LAB — CBC WITH DIFFERENTIAL/PLATELET
BASO%: 1.2 % (ref 0.0–2.0)
BASOS ABS: 0.1 10*3/uL (ref 0.0–0.1)
EOS ABS: 0.4 10*3/uL (ref 0.0–0.5)
EOS%: 6 % (ref 0.0–7.0)
HEMATOCRIT: 34 % — AB (ref 34.8–46.6)
HEMOGLOBIN: 11.2 g/dL — AB (ref 11.6–15.9)
LYMPH#: 1.6 10*3/uL (ref 0.9–3.3)
LYMPH%: 27.4 % (ref 14.0–49.7)
MCH: 31.8 pg (ref 25.1–34.0)
MCHC: 33 g/dL (ref 31.5–36.0)
MCV: 96.4 fL (ref 79.5–101.0)
MONO#: 0.4 10*3/uL (ref 0.1–0.9)
MONO%: 7.5 % (ref 0.0–14.0)
NEUT#: 3.4 10*3/uL (ref 1.5–6.5)
NEUT%: 57.9 % (ref 38.4–76.8)
Platelets: 260 10*3/uL (ref 145–400)
RBC: 3.52 10*6/uL — ABNORMAL LOW (ref 3.70–5.45)
RDW: 18.7 % — AB (ref 11.2–14.5)
WBC: 5.9 10*3/uL (ref 3.9–10.3)

## 2015-07-06 LAB — COMPREHENSIVE METABOLIC PANEL
ALBUMIN: 3.8 g/dL (ref 3.5–5.0)
ALT: <9 U/L (ref 0–55)
AST: 16 U/L (ref 5–34)
Alkaline Phosphatase: 54 U/L (ref 40–150)
Anion Gap: 7 mEq/L (ref 3–11)
BUN: 19.8 mg/dL (ref 7.0–26.0)
CHLORIDE: 105 meq/L (ref 98–109)
CO2: 28 meq/L (ref 22–29)
Calcium: 9.2 mg/dL (ref 8.4–10.4)
Creatinine: 1.6 mg/dL — ABNORMAL HIGH (ref 0.6–1.1)
EGFR: 37 mL/min/{1.73_m2} — AB (ref >90)
GLUCOSE: 81 mg/dL (ref 70–140)
POTASSIUM: 4 meq/L (ref 3.5–5.1)
Sodium: 140 mEq/L (ref 136–145)
Total Bilirubin: 1 mg/dL (ref 0.20–1.20)
Total Protein: 7.4 g/dL (ref 6.4–8.3)

## 2015-07-06 NOTE — Telephone Encounter (Signed)
Gave pt appt & and avs

## 2015-07-06 NOTE — Progress Notes (Signed)
[place]  [phone] [phone]) [phone]  Clinic Follow Up Note   Patient Care Team: [name], MD as PCP - General (Family Medicine) [name], MD as Consulting Physician (Medical Oncology) [name], MD as Consulting Physician (General Surgery) [name], MD as Consulting Physician (Orthopedic Surgery) 07/06/2015     CHIEF COMPLAINTS:  Follow up stage II colon cancer  Oncology History   Cancer of right colon [facility])   Staging form: Colon and Rectum, AJCC 7th Edition     Pathologic stage from 01/23/2015: Stage IIC (T4b, N0, cM0) - Signed by [name], MD on 02/05/2015       Cancer of right colon ([place])   01/21/2015 Imaging  CT abdomen and pelvis showed high-grade small bowel obstruction,  right I [place] ureteral nephrosis,  multiple liver cysts.  CT chest was negative for metastatic lesion.   01/23/2015 Pathology Results  invasive adenocarcinoma extending into the pericecal connective tissue,  grade 2, LVI (-),  no perineural invasion, 3 lymph nodes were negative.   01/23/2015 Surgery   terminal ileum and cecum seegmental resection with anastomosis by Dr. Hassell Done   01/23/2015 Initial Diagnosis Cancer of right colon ([place])   03/13/2015 -  Chemotherapy Xeloda 1500 mg bid X 14 days on-7 off    HISTORY OF PRESENTING ILLNESS (02/05/2015):  Beth Wilkins 75 y.o. female  with past medical history of moderate dementia, hypertension, is here because of recently diagnosed stage II colon cancer. She is accompanied to the clinic by her daughter. History of manic is from the chart and her daughter.  She has had abdominal discomfort, nausea and vomiting, and norexia for  the past to 3 months along with 15 pounds weight loss, . Her nausea was mild and intermittent, she does not seek medical attention initially. She presented with  worsening symptoms for 2-3 weeks, and episodes of projectile vomiting and dehydration to emergency room, was admitted to [facility]  on 01/20/2015. CT scan reviewed small bowel obstruction, and possible appendiceal rupture. She was brought to OR on 01/23/2015, underwent right hemicolectomy with anastomosis by Dr. Hassell Done. Surgical path reviewed adenocarcinoma at the cecum. She was discharged home on 01/26/2015.  She has recovered well, eating much better than prior to surgery, move around without restriction. No pain, she has loose BM 3 times a day, and she has backed off her mirealax.  she lives with her daughter. She states her appetite is moderate, sometime low, and she refuses to eat if she doesn't have appetite. No nausea, vomiting, abdominal bloating since her surgery.  Her last colonoscopy was 6-8 years ago, and her stool OB was negative this year.   CURRENT THERAPY: Adjuvant chemotherapy with Capecitabine 1500 milligram, every 12 hours, 2 weeks on, and one week off, started on 03/13/15, stopped after cycle 5 due to worsening renal function   INTERIM HISTORY: Beth Wilkins returns for follow-up. She is accompanied by her daughter to clinic today. She completed cycle 5 xeloda on 4/26. She is off this week. She tolerated well overall, no new complaints. She denies any significant pain, abdominal discomfort, nausea, or other symptoms. According to her daughter, she has been eating and drinking moderately well, no big appetite, overall stable, weight stable overall.   MEDICAL HISTORY:  Past Medical History  Diagnosis Date  . GERD (gastroesophageal reflux disease)   . Hypertension   . Hypercholesteremia   . Pelvis fracture ([place]) 01/31/2013  . Dementia     SURGICAL HISTORY: Past Surgical History  Procedure Laterality Date  . Cataract extraction, bilateral    . Laparoscopy N/A 01/23/2015    Procedure: DIAGNOSTIC LAPAROSCOPY, EXPLORATORY LAPAROTOMY  RIGHT HEMI-COLECTOMY FOR OBSTRUCTION OF RIGHT TERMINAL ILEUM;  Surgeon: [name], MD;  Location: [facility] ORS;  Service: General;  Laterality: N/A;    SOCIAL HISTORY: Social History    Social History  . Marital Status: Legally Separated    Spouse Name: N/A  . Number of Children: N/A  . Years of Education: N/A   Occupational History  . Not on file.   Social History Main Topics  . Smoking status: Former Smoker -- 0.25 packs/day for 50 years    Types: Cigarettes    Quit date: 01/25/2015  . Smokeless tobacco: Never Used     Comment: smokes occasionally  . Alcohol Use: 0.0 oz/week    0 Standard drinks or equivalent per week     Comment: occasional   . Drug Use: No  . Sexual Activity: Not on file   Other Topics Concern  . Not on file   Social History Narrative   Legally separated   Lives w/daughter, Josslyn Ciolek   Has total of #2 daughters and #1 son   Dementia   Fall Risk-ambulates w/cane    FAMILY HISTORY: Family History  Problem Relation Age of Onset  . Diabetes Mellitus II Father   . Cervical cancer Other   . Cancer Mother 36    ovarian cancer     ALLERGIES:  is allergic to namenda and oxycodone.  MEDICATIONS:  Current Outpatient Prescriptions  Medication Sig Dispense Refill  . acetaminophen (TYLENOL) 325 MG tablet Take 2 tablets (650 mg total) by mouth every 6 (six) hours as needed for mild pain (or Fever >/= 101).    Marland Kitchen amLODipine (NORVASC) 5 MG tablet Take 5 mg by mouth daily.    . capecitabine (XELODA) 500 MG tablet Take 3 tablets (1,500 mg total) by mouth 2 (two) times daily after a meal. 84 tablet 3  . Cholecalciferol (VITAMIN D3) 2000 UNITS TABS Take by mouth daily.    . Coenzyme Q10 (CO Q 10) 100 MG CAPS Take by mouth daily.    Marland Kitchen donepezil (ARICEPT) 10 MG tablet Take 1 tab daily 30 tablet 11  . esomeprazole (NEXIUM) 40 MG capsule Take 40 mg by mouth as needed.     . MULTIPLE VITAMIN PO Take by mouth daily. Take Geritol - MVI with Iron.  Take 1 Tbsp daily.    . simvastatin (ZOCOR) 20 MG tablet Take 20 mg by mouth daily at 6 PM.    . polyethylene glycol (MIRALAX / GLYCOLAX) packet Take 17 g by mouth daily. (Patient not taking:  Reported on 07/06/2015) 14 each 0   No current facility-administered medications for this visit.    REVIEW OF SYSTEMS:   Constitutional: Denies fevers, chills or abnormal night sweats Eyes: Denies blurriness of vision, double vision or watery eyes Ears, nose, mouth, throat, and face: Denies mucositis or sore throat Respiratory: Denies cough, dyspnea or wheezes Cardiovascular: Denies palpitation, chest discomfort or lower extremity swelling Gastrointestinal:  Denies nausea, heartburn or change in bowel habits Skin: Denies abnormal skin rashes Lymphatics: Denies new lymphadenopathy or easy bruising Neurological:Denies numbness, tingling or new weaknesses Behavioral/Psych: Mood is stable, no new changes  All other systems were reviewed with the patient and are negative.  PHYSICAL EXAMINATION: ECOG PERFORMANCE STATUS: 1 - Symptomatic but completely ambulatory  Filed Vitals:   07/06/15 1438  BP: 154/69  Pulse: 71  Temp: 98.1 F (36.7 C)  Resp: 18   Filed Weights   07/06/15 1438  Weight: 125 lb 11.2 oz (57.017 kg)    GENERAL:alert, no distress and comfortable SKIN: skin color, texture, turgor are normal, no rashes or significant lesions EYES: normal, conjunctiva are pink and non-injected, sclera clear OROPHARYNX:no exudate, no erythema and lips, buccal mucosa, and tongue normal  NECK: supple, thyroid normal size, non-tender, without nodularity LYMPH:  no palpable lymphadenopathy in the cervical, axillary or inguinal LUNGS: clear to auscultation and percussion with normal breathing effort HEART: regular rate & rhythm and no murmurs and no lower extremity edema ABDOMEN:abdomen soft, non-tender and normal bowel sounds Musculoskeletal:no cyanosis of digits and no clubbing  PSYCH: alert & oriented x 3 with fluent speech NEURO: no focal motor/sensory deficits  LABORATORY DATA:  I have reviewed the data as listed CBC Latest Ref Rng 07/06/2015 06/15/2015 05/26/2015  WBC 3.9 - 10.3  10e3/uL 5.9 7.7 6.8  Hemoglobin 11.6 - 15.9 g/dL 11.2(L) 11.5(L) 11.4(L)  Hematocrit 34.8 - 46.6 % 34.0(L) 34.2(L) 33.1(L)  Platelets 145 - 400 10e3/uL 260 311 201    CMP Latest Ref Rng 07/06/2015 06/15/2015 05/26/2015  Glucose 70 - 140 mg/dl 81 85 100  BUN 7.0 - 26.0 mg/dL 19.8 23.0 12.5  Creatinine 0.6 - 1.1 mg/dL 1.6(H) 1.5(H) 1.3(H)  Sodium 136 - 145 mEq/L 140 142 141  Potassium 3.5 - 5.1 mEq/L 4.0 3.7 3.5  CO2 22 - 29 mEq/L _0 Calcium 8.4 - 10.4 mg/dL 9.2 9.6 9.3  Total Protein 6.4 - 8.3 g/dL 7.4 7.9 7.6  Total Bilirubin 0.20 - 1.20 mg/dL 1.00 0.77 1.05  Alkaline Phos 40 - 150 U/L 54 65 62  AST 5 - 34 U/L _1 ALT 0 - 55 U/L <9 <9 <9     PATHOLOGY REPORT: Diagnosis 01/23/2015 Colon, segmental resection, with terminal ileum and cecum - INVASIVE ADENOCARCINOMA EXTENDING INTO PERICECAL CONNECTIVE TISSUE AND INVOLVING SMALL INTESTINE. - THREE BENIGN LYMPH NODES (0/3). - SEE ONCOLOGY TABLE BELOW.  Microscopic Comment COLON AND RECTUM (INCLUDING TRANS-ANAL RESECTION): Specimen: Terminal ileum and cecum. Procedure: Resection. Tumor site: Cecum adjacent to appendix. Specimen integrity: Intact. Macroscopic intactness of mesorectum: Not applicable. Macroscopic tumor perforation: No. Invasive tumor: Maximum size: 4.0 cm. Histologic type(s): Colorectal adenocarcinoma. Histologic grade and differentiation: G2: moderately differentiated/low grade. Type of polyp in which invasive carcinoma arose: No residual polyp. Microscopic extension of invasive tumor: Into pericecal connective tissue and terminal ileum. Lymph-Vascular invasion: Not identified. Peri-neural invasion: Not identified. Tumor deposit(s) (discontinuous extramural extension): No. Resection margins: Proximal margin: Free of tumor. Distal margin: Free of tumor. Circumferential (radial) (posterior ascending, posterior descending; lateral and posterior mid-rectum; and entire lower 1/3 rectum): Free of  tumor. Mesenteric margin (sigmoid and transverse): N/A. Distance closest margin (if all above margins negative): N/A. Treatment effect (neo-adjuvant therapy): No. Additional polyp(s): No. Non-neoplastic findings: N/A. Lymph nodes: number examined 3; number positive: 0. Pathologic Staging: pT4b, pN0, pMX. Ancillary studies: Mismatch repair protein by immunohistochemistry pending. Comments: There is a colorectal-type adenocarcinoma arising in the cecum in the area of the appendiceal orifice. The tumor extends into the pericecal connective tissue, and involves the attached segment of terminal ileum. Immunohistochemistry shows the tumor is positive with CDX2 and cytokeratin 20, and negative with cytokeratin 7, estrogen receptor and WT-1 supporting a diagnosis of primary colorectal adenocarcinoma. ADDITIONAL INFORMATION: Mismatch Repair (MMR) Protein Immunohistochemistry (IHC) IHC Expression Result: MLH1: Preserved nuclear expression (greater 50% tumor expression) MSH2: Preserved  nuclear expression (greater 50% tumor expression) MSH6: Preserved nuclear expression (greater 50% tumor expression) PMS2: Preserved nuclear expression (greater 50% tumor expression) * Internal control demonstrates intact nuclear expression Interpretation: NORMAL There is preserved expression of the major and minor MMR proteins. There is a very low probability that microsatellite instability (MSI) is present. However, certain clinically significant MMR protein mutations may result in preservation of nuclear expression. It is recommended that the preservation of protein expression be correlated with molecular based MSI testing.  RADIOGRAPHIC STUDIES: I have personally reviewed the radiological images as listed and agreed with the findings in the report.  Ct Abdomen Pelvis Wo Contrast  01/21/2015  CLINICAL DATA:  Abdominal distention for 4 days with nausea and vomiting. Decreased urine output. EXAM: CT ABDOMEN AND  PELVIS WITHOUT CONTRAST TECHNIQUE: Multidetector CT imaging of the abdomen and pelvis was performed following the standard protocol without IV contrast. COMPARISON:  None. FINDINGS: Lower chest:  The included lung bases are clear. Liver: Ladder density lesion in the anterior subcapsular left lobe measures 3.6 cm and is consistent with simple cyst. Additional smaller hypodense lesions in the left and anterior right lobe are incompletely characterized without contrast. Hepatobiliary: Gallbladder physiologically distended, displaced laterally by dilated stomach. No calcified gallstone. Pancreas: Normal.  No ductal dilatation or surrounding inflammation. Spleen: Small soft tissue density in the left upper quadrant, likely diminutive spleen. Adrenal glands: No nodule. Kidneys: Multiple bilateral low-density lesions in both kidneys consistent with cysts. Mild right hydroureteronephrosis, distal ureter is suboptimally defined. There is an nonobstructing stone in the lower right kidney. No ureteral calculi. No left hydronephrosis. Stomach/Bowel: Stomach markedly distended with fluid. Diffuse fluid-filled dilated small bowel, measuring up to 4.1 cm. Transition point suspected in the right lower quadrant, however no definite decompressed small bowel is seen. No pneumatosis. Small volume of stool throughout the colon without colonic wall thickening. The ileocecal valve not well-defined. The appendix is not definitively seen. Vascular/Lymphatic: No retroperitoneal adenopathy. Abdominal aorta is normal in caliber. Dense atherosclerosis of the abdominal aorta and its branches. Reproductive: Uterus appears in situ, suboptimally defined due to fluid-filled adjacent bowel loops. Limited assessment for adnexal mass. Bladder: Minimally distended, irregular/tubular in shape. No wall thickening. Displaced posteriorly due to dilated small bowel. Other: Mild mesenteric edema centrally.  No free air or free fluid. Musculoskeletal: There  are no acute or suspicious osseous abnormalities. Advanced facet arthropathy in the lumbar spine. Transitional lumbosacral anatomy. IMPRESSION: 1. High-grade small bowel obstruction with transition point in the right lower quadrant. No definite decompressed small bowel loops are seen, suspect distal ileum a site of obstruction. No perforation, pneumatosis or free fluid. 2. Right hydroureteronephrosis, however no obstructing lesion or stone. There is a nonobstructing stone in the lower right kidney. Multiple bilateral renal cysts. 3. Multiple liver lesions, largest represents a simple cyst. Smaller lesions are incompletely characterized without contrast. Electronically Signed   By: Jeb Levering M.D.   On: 01/21/2015 03:26   Ct Chest W Contrast  01/26/2015  CLINICAL DATA:  Hypoxia.  Bowel obstruction. EXAM: CT CHEST WITH CONTRAST TECHNIQUE: Multidetector CT imaging of the chest was performed during intravenous contrast administration. CONTRAST:  62m OMNIPAQUE IOHEXOL 300 MG/ML  SOLN COMPARISON:  None FINDINGS: Mediastinum: The heart size appears normal. There is no pericardial effusion identified. The trachea is patent and is midline. There is a nasogastric tube within the esophagus. Tip is looped within the stomach. There is no mediastinal or hilar adenopathy. No enlarged axillary or supraclavicular lymph nodes. Lungs/Pleura: Small  left pleural effusion identified. Moderate changes of centrilobular emphysema identified. There is a discs shaped nodule within the right upper lobe which measures 5 mm, image 29 of series 603 and image 16 of series 15. Subsegmental atelectasis noted in the left base. Upper Abdomen: There is a low density structure in the lateral segment of left lobe measuring 7 mm. This is too small to characterize. Renal cysts noted. Musculoskeletal: Mild degenerative disc disease noted within the thoracic spine.  IMPRESSION: 1. Small left pleural effusion. 2. Right upper lobe lung nodule  measures 5 mm. If the patient is at high risk for bronchogenic carcinoma, follow-up chest CT at 6-12 months is recommended. If the patient is at low risk for bronchogenic carcinoma, follow-up chest CT at 12 months is recommended. This recommendation follows the consensus statement: Guidelines for Management of Small Pulmonary Nodules Detected on CT Scans: A Statement from the [employer] as published in Radiology 2005;237:395-400. 3. Emphysema. Electronically Signed   By: Kerby Moors M.D.   On: 01/26/2015 15:51     ASSESSMENT & PLAN: 75 year old African-American female, with past medical history of moderate dementia, independent with ADLs, hypertension, presents with small bowel obstruction and weight loss.  1. 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 -she has been tolerating chemotherapy capecitabine relatively well, has completed a total of 5 cycles -Lab results reviewed with patient and her daughter, her renal function has been gradually getting worse, creatinine 1.6 today EGFR 37. I recommend her to stop Xeloda due to her worsening renal function. I think the benefit of 3 more cycle of Xeloda is probably outweighed the risk of potential side effects, especially her renal function. -We discussed colon cancer surveillance. I recommend her to have a restaging CT scan without contrast in a month, then we will continue following her every 3-4 months for the first 2 years, then every 6 months for total 5 years. I'll repeat a CT scan every 6-12 months for up to 5 years  -Patient and her daughter voiced good understanding about the above, and agrees with the plan  2. Anemia - she has a mild normocytic anemia,  Hypo-productive, stable overall  - her lab results of B12, folate, iron and ferritin were normal, TIBC was low,  this is likely anemia of chronic  disease. - we'll continue monitoring.  3. CKD stage III -Her Cr was around 1 in 08/2013, Cr increased to 2.4 on 01/20/2015 when she was diagnosed with colon cancer due to dehydration, improved some and has been around 1.2-1.6 lately.  -her PCP has changed her Maxzide to amlodipine -I encouraged her to drink more fluids -I encouraged her to monitor her blood pressure at home, and get her hypertension well-controlled -Avoid renal toxins such as NSAIDs and IV contrast  4. HTN, dementia -She will continue her medication and follow-up with her primary care physician.   Plan -will stop her adjuvant Xeloda due to her worsening renal function -Return to clinic in 4 weeks for a CT chest, abdomen and pelvis with oral contrast alone, and I'll see her the following week.  All questions were answered. The patient knows to call the clinic with any problems, questions or concerns.  I spent 20 minutes counseling the patient face to face. The total time spent in the appointment was 25 minutes and more than 50% was on counseling.  [name], MD 07/06/2015

## 2015-07-07 ENCOUNTER — Ambulatory Visit: Payer: Medicare Other | Admitting: Hematology

## 2015-07-07 ENCOUNTER — Other Ambulatory Visit: Payer: Medicare Other

## 2015-08-04 ENCOUNTER — Other Ambulatory Visit (HOSPITAL_BASED_OUTPATIENT_CLINIC_OR_DEPARTMENT_OTHER): Payer: Medicare Other

## 2015-08-04 ENCOUNTER — Ambulatory Visit (HOSPITAL_COMMUNITY)
Admission: RE | Admit: 2015-08-04 | Discharge: 2015-08-04 | Disposition: A | Discharge status: Home / Self Care | Payer: Medicare Other | Source: Ambulatory Visit | Attending: Hematology | Admitting: Hematology

## 2015-08-04 DIAGNOSIS — J432 Centrilobular emphysema: Secondary | ICD-10-CM | POA: Insufficient documentation

## 2015-08-04 DIAGNOSIS — N281 Cyst of kidney, acquired: Secondary | ICD-10-CM | POA: Diagnosis not present

## 2015-08-04 DIAGNOSIS — C182 Malignant neoplasm of ascending colon: Secondary | ICD-10-CM | POA: Insufficient documentation

## 2015-08-04 DIAGNOSIS — R1909 Other intra-abdominal and pelvic swelling, mass and lump: Secondary | ICD-10-CM | POA: Diagnosis not present

## 2015-08-04 DIAGNOSIS — C180 Malignant neoplasm of cecum: Secondary | ICD-10-CM

## 2015-08-04 DIAGNOSIS — I708 Atherosclerosis of other arteries: Secondary | ICD-10-CM | POA: Insufficient documentation

## 2015-08-04 DIAGNOSIS — R911 Solitary pulmonary nodule: Secondary | ICD-10-CM | POA: Insufficient documentation

## 2015-08-04 DIAGNOSIS — I700 Atherosclerosis of aorta: Secondary | ICD-10-CM | POA: Diagnosis not present

## 2015-08-04 DIAGNOSIS — M954 Acquired deformity of chest and rib: Secondary | ICD-10-CM | POA: Diagnosis not present

## 2015-08-04 DIAGNOSIS — K7689 Other specified diseases of liver: Secondary | ICD-10-CM | POA: Insufficient documentation

## 2015-08-04 DIAGNOSIS — N131 Hydronephrosis with ureteral stricture, not elsewhere classified: Secondary | ICD-10-CM | POA: Diagnosis not present

## 2015-08-04 LAB — COMPREHENSIVE METABOLIC PANEL
ALT: <9 U/L (ref 0–55)
ANION GAP: 10 meq/L (ref 3–11)
AST: 17 U/L (ref 5–34)
Albumin: 4 g/dL (ref 3.5–5.0)
Alkaline Phosphatase: 63 U/L (ref 40–150)
BILIRUBIN TOTAL: 1.01 mg/dL (ref 0.20–1.20)
BUN: 24.7 mg/dL (ref 7.0–26.0)
CALCIUM: 9.7 mg/dL (ref 8.4–10.4)
CO2: 27 meq/L (ref 22–29)
Chloride: 106 mEq/L (ref 98–109)
Creatinine: 1.6 mg/dL — ABNORMAL HIGH (ref 0.6–1.1)
EGFR: 38 mL/min/{1.73_m2} — AB (ref >90)
Glucose: 90 mg/dl (ref 70–140)
Potassium: 3.6 mEq/L (ref 3.5–5.1)
Sodium: 143 mEq/L (ref 136–145)
TOTAL PROTEIN: 8.1 g/dL (ref 6.4–8.3)

## 2015-08-04 LAB — CBC WITH DIFFERENTIAL/PLATELET
BASO%: 1.3 % (ref 0.0–2.0)
Basophils Absolute: 0.1 10*3/uL (ref 0.0–0.1)
EOS ABS: 0.3 10*3/uL (ref 0.0–0.5)
EOS%: 5 % (ref 0.0–7.0)
HEMATOCRIT: 37.5 % (ref 34.8–46.6)
HGB: 12.4 g/dL (ref 11.6–15.9)
LYMPH#: 1.7 10*3/uL (ref 0.9–3.3)
LYMPH%: 24.5 % (ref 14.0–49.7)
MCH: 31 pg (ref 25.1–34.0)
MCHC: 33 g/dL (ref 31.5–36.0)
MCV: 94 fL (ref 79.5–101.0)
MONO#: 0.5 10*3/uL (ref 0.1–0.9)
MONO%: 6.9 % (ref 0.0–14.0)
NEUT%: 62.3 % (ref 38.4–76.8)
NEUTROS ABS: 4.2 10*3/uL (ref 1.5–6.5)
PLATELETS: 287 10*3/uL (ref 145–400)
RBC: 3.98 10*6/uL (ref 3.70–5.45)
RDW: 15.4 % — ABNORMAL HIGH (ref 11.2–14.5)
WBC: 6.8 10*3/uL (ref 3.9–10.3)

## 2015-08-05 LAB — CEA: CEA1: 10.3 ng/mL — AB (ref 0.0–4.7)

## 2015-08-06 ENCOUNTER — Encounter: Payer: Self-pay | Admitting: Family Medicine

## 2015-08-10 ENCOUNTER — Ambulatory Visit (HOSPITAL_BASED_OUTPATIENT_CLINIC_OR_DEPARTMENT_OTHER): Payer: Medicare Other | Admitting: Hematology

## 2015-08-10 ENCOUNTER — Encounter: Payer: Self-pay | Admitting: Hematology

## 2015-08-10 ENCOUNTER — Telehealth: Payer: Self-pay | Admitting: Hematology

## 2015-08-10 VITALS — BP 154/73 | HR 75 | Temp 98.4°F | Resp 18 | Ht 65.0 in | Wt 128.8 lb

## 2015-08-10 DIAGNOSIS — I10 Essential (primary) hypertension: Secondary | ICD-10-CM | POA: Diagnosis not present

## 2015-08-10 DIAGNOSIS — F0390 Unspecified dementia without behavioral disturbance: Secondary | ICD-10-CM | POA: Diagnosis not present

## 2015-08-10 DIAGNOSIS — C182 Malignant neoplasm of ascending colon: Secondary | ICD-10-CM

## 2015-08-10 DIAGNOSIS — D638 Anemia in other chronic diseases classified elsewhere: Secondary | ICD-10-CM | POA: Diagnosis not present

## 2015-08-10 DIAGNOSIS — N183 Chronic kidney disease, stage 3 (moderate): Secondary | ICD-10-CM

## 2015-08-10 NOTE — Progress Notes (Signed)
Beth Wilkins  [phone]) [phone] Fax:([phone]) [phone]  Clinic Follow Up Note   Patient Care Team: [name], MD as PCP - General (Family Medicine) [name], MD as Consulting Physician (Medical Oncology) [name], MD as Consulting Physician (General Surgery) [name], MD as Consulting Physician (Orthopedic Surgery) 08/10/2015     CHIEF COMPLAINTS:  Follow up stage II colon cancer  Oncology History   Cancer of right colon [facility])   Staging form: Colon and Rectum, AJCC 7th Edition     Pathologic stage from 01/23/2015: Stage IIC (T4b, N0, cM0) - Signed by [name], MD on 02/05/2015       Cancer of right colon ([place])   01/21/2015 Imaging  CT abdomen and pelvis showed high-grade small bowel obstruction,  right I [place] ureteral nephrosis,  multiple liver cysts.  CT chest was negative for metastatic lesion.   01/23/2015 Pathology Results  invasive adenocarcinoma extending into the pericecal connective tissue,  grade 2, LVI (-),  no perineural invasion, 3 lymph nodes were negative.   01/23/2015 Surgery   terminal ileum and cecum seegmental resection with anastomosis by Dr. Hassell Done   01/23/2015 Initial Diagnosis Cancer of right colon ([place])   03/13/2015 - 07/06/2015 Chemotherapy Xeloda 1500 mg bid X 14 days on-7 off, s/p 5 cycles, stopped early due to wrosening renal function    HISTORY OF PRESENTING ILLNESS (02/05/2015):  Beth Wilkins 75 y.o. female  with past medical history of moderate dementia, hypertension, is here because of recently diagnosed stage II colon cancer. She is accompanied to the clinic by her daughter. History of manic is from the chart and her daughter.  She has had abdominal discomfort, nausea and vomiting, and norexia for  the past to 3 months along with 15 pounds weight loss, . Her nausea was mild and intermittent, she does not seek medical attention initially. She presented with  worsening symptoms for 2-3 weeks, and episodes of projectile  vomiting and dehydration to emergency room, was admitted to [facility] on 01/20/2015. CT scan reviewed small bowel obstruction, and possible appendiceal rupture. She was brought to OR on 01/23/2015, underwent right hemicolectomy with anastomosis by Dr. Hassell Done. Surgical path reviewed adenocarcinoma at the cecum. She was discharged home on 01/26/2015.  She has recovered well, eating much better than prior to surgery, move around without restriction. No pain, she has loose BM 3 times a day, and she has backed off her mirealax.  she lives with her daughter. She states her appetite is moderate, sometime low, and she refuses to eat if she doesn't have appetite. No nausea, vomiting, abdominal bloating since her surgery.  Her last colonoscopy was 6-8 years ago, and her stool OB was negative this year.   CURRENT THERAPY: Observation  INTERIM HISTORY: Dalayah returns for follow-up. She is accompanied by her daughter to clinic today. She is doing well overall, her daughter noticed she has some more energy, and slightly better appetite since she came off chemotherapy. She denies any new pain, except mild left mid back pain which has been chronic and stable. She denies any abdominal discomfort, nausea, her Phineas Douglas was normal, no hematochezia. She denies dysuria, or hematuria. No other new symptoms.  MEDICAL HISTORY:  Past Medical History  Diagnosis Date  . GERD (gastroesophageal reflux disease)   . Hypertension   . Hypercholesteremia   . Pelvis fracture ([place]) 01/31/2013  . Dementia     SURGICAL HISTORY: Past Surgical History  Procedure Laterality Date  . Cataract extraction, bilateral    .  Laparoscopy N/A 01/23/2015    Procedure: DIAGNOSTIC LAPAROSCOPY, EXPLORATORY LAPAROTOMY  RIGHT HEMI-COLECTOMY FOR OBSTRUCTION OF RIGHT TERMINAL ILEUM;  Surgeon: [name], MD;  Location: [facility];  Service: General;  Laterality: N/A;    SOCIAL HISTORY: Social History   Social History  . Marital Status: Legally  Separated    Spouse Name: N/A  . Number of Children: N/A  . Years of Education: N/A   Occupational History  . Not on file.   Social History Main Topics  . Smoking status: Former Smoker -- 0.25 packs/day for 50 years    Types: Cigarettes    Quit date: 01/25/2015  . Smokeless tobacco: Never Used     Comment: smokes occasionally  . Alcohol Use: 0.0 oz/week    0 Standard drinks or equivalent per week     Comment: occasional   . Drug Use: No  . Sexual Activity: Not on file   Other Topics Concern  . Not on file   Social History Narrative   Legally separated   Lives w/daughter, Mercede Rollo   Has total of #2 daughters and #1 son   Dementia   Fall Risk-ambulates w/cane    FAMILY HISTORY: Family History  Problem Relation Age of Onset  . Diabetes Mellitus II Father   . Cervical cancer Other   . Cancer Mother 53    ovarian cancer     ALLERGIES:  is allergic to namenda and oxycodone.  MEDICATIONS:  Current Outpatient Prescriptions  Medication Sig Dispense Refill  . acetaminophen (TYLENOL) 325 MG tablet Take 2 tablets (650 mg total) by mouth every 6 (six) hours as needed for mild pain (or Fever >/= 101).    Marland Kitchen amLODipine (NORVASC) 5 MG tablet Take 5 mg by mouth daily.    . Cholecalciferol (VITAMIN D3) 2000 UNITS TABS Take by mouth daily.    . Coenzyme Q10 (CO Q 10) 100 MG CAPS Take by mouth daily.    Marland Kitchen donepezil (ARICEPT) 10 MG tablet Take 1 tab daily 30 tablet 11  . esomeprazole (NEXIUM) 40 MG capsule Take 40 mg by mouth as needed.     . MULTIPLE VITAMIN PO Take by mouth daily. Take Geritol - MVI with Iron.  Take 1 Tbsp daily.    . simvastatin (ZOCOR) 20 MG tablet Take 20 mg by mouth daily at 6 PM.    . capecitabine (XELODA) 500 MG tablet Take 3 tablets (1,500 mg total) by mouth 2 (two) times daily after a meal. (Patient not taking: Reported on 08/10/2015) 84 tablet 3  . polyethylene glycol (MIRALAX / GLYCOLAX) packet Take 17 g by mouth daily. (Patient not taking: Reported  on 07/06/2015) 14 each 0   No current facility-administered medications for this visit.    REVIEW OF SYSTEMS:   Constitutional: Denies fevers, chills or abnormal night sweats Eyes: Denies blurriness of vision, double vision or watery eyes Ears, nose, mouth, throat, and face: Denies mucositis or sore throat Respiratory: Denies cough, dyspnea or wheezes Cardiovascular: Denies palpitation, chest discomfort or lower extremity swelling Gastrointestinal:  Denies nausea, heartburn or change in bowel habits Skin: Denies abnormal skin rashes Lymphatics: Denies new lymphadenopathy or easy bruising Neurological:Denies numbness, tingling or new weaknesses Behavioral/Psych: Mood is stable, no new changes  All other systems were reviewed with the patient and are negative.  PHYSICAL EXAMINATION: ECOG PERFORMANCE STATUS: 1 - Symptomatic but completely ambulatory  Filed Vitals:   08/10/15 1430  BP: 154/73  Pulse: 75  Temp: 98.4 F (36.9 C)  Resp: 18   Filed Weights   08/10/15 1430  Weight: 128 lb 12.8 oz (58.423 kg)    GENERAL:alert, no distress and comfortable SKIN: skin color, texture, turgor are normal, no rashes or significant lesions EYES: normal, conjunctiva are pink and non-injected, sclera clear OROPHARYNX:no exudate, no erythema and lips, buccal mucosa, and tongue normal  NECK: supple, thyroid normal size, non-tender, without nodularity LYMPH:  no palpable lymphadenopathy in the cervical, axillary or inguinal LUNGS: clear to auscultation and percussion with normal breathing effort HEART: regular rate & rhythm and no murmurs and no lower extremity edema ABDOMEN:abdomen soft, non-tender and normal bowel sounds Musculoskeletal:no cyanosis of digits and no clubbing  PSYCH: alert & oriented x 3 with fluent speech NEURO: no focal motor/sensory deficits  LABORATORY DATA:  I have reviewed the data as listed CBC Latest Ref Rng 08/04/2015 07/06/2015 06/15/2015  WBC 3.9 - 10.3 10e3/uL 6.8  5.9 7.7  Hemoglobin 11.6 - 15.9 g/dL 12.4 11.2(L) 11.5(L)  Hematocrit 34.8 - 46.6 % 37.5 34.0(L) 34.2(L)  Platelets 145 - 400 10e3/uL 287 260 311    CMP Latest Ref Rng 08/04/2015 07/06/2015 06/15/2015  Glucose 70 - 140 mg/dl 90 81 85  BUN 7.0 - 26.0 mg/dL 24.7 19.8 23.0  Creatinine 0.6 - 1.1 mg/dL 1.6(H) 1.6(H) 1.5(H)  Sodium 136 - 145 mEq/L 143 140 142  Potassium 3.5 - 5.1 mEq/L 3.6 4.0 3.7  CO2 22 - 29 mEq/L '27 28 27  '$ Calcium 8.4 - 10.4 mg/dL 9.7 9.2 9.6  Total Protein 6.4 - 8.3 g/dL 8.1 7.4 7.9  Total Bilirubin 0.20 - 1.20 mg/dL 1.01 1.00 0.77  Alkaline Phos 40 - 150 U/L 63 54 65  AST 5 - 34 U/L '17 16 18  '$ ALT 0 - 55 U/L <9 <9 <9     PATHOLOGY REPORT: Diagnosis 01/23/2015 Colon, segmental resection, with terminal ileum and cecum - INVASIVE ADENOCARCINOMA EXTENDING INTO PERICECAL CONNECTIVE TISSUE AND INVOLVING SMALL INTESTINE. - THREE BENIGN LYMPH NODES (0/3). - SEE ONCOLOGY TABLE BELOW.  Microscopic Comment COLON AND RECTUM (INCLUDING TRANS-ANAL RESECTION): Specimen: Terminal ileum and cecum. Procedure: Resection. Tumor site: Cecum adjacent to appendix. Specimen integrity: Intact. Macroscopic intactness of mesorectum: Not applicable. Macroscopic tumor perforation: No. Invasive tumor: Maximum size: 4.0 cm. Histologic type(s): Colorectal adenocarcinoma. Histologic grade and differentiation: G2: moderately differentiated/low grade. Type of polyp in which invasive carcinoma arose: No residual polyp. Microscopic extension of invasive tumor: Into pericecal connective tissue and terminal ileum. Lymph-Vascular invasion: Not identified. Peri-neural invasion: Not identified. Tumor deposit(s) (discontinuous extramural extension): No. Resection margins: Proximal margin: Free of tumor. Distal margin: Free of tumor. Circumferential (radial) (posterior ascending, posterior descending; lateral and posterior mid-rectum; and entire lower 1/3 rectum): Free of tumor. Mesenteric margin  (sigmoid and transverse): N/A. Distance closest margin (if all above margins negative): N/A. Treatment effect (neo-adjuvant therapy): No. Additional polyp(s): No. Non-neoplastic findings: N/A. Lymph nodes: number examined 3; number positive: 0. Pathologic Staging: pT4b, pN0, pMX. Ancillary studies: Mismatch repair protein by immunohistochemistry pending. Comments: There is a colorectal-type adenocarcinoma arising in the cecum in the area of the appendiceal orifice. The tumor extends into the pericecal connective tissue, and involves the attached segment of terminal ileum. Immunohistochemistry shows the tumor is positive with CDX2 and cytokeratin 20, and negative with cytokeratin 7, estrogen receptor and WT-1 supporting a diagnosis of primary colorectal adenocarcinoma. ADDITIONAL INFORMATION: Mismatch Repair (MMR) Protein Immunohistochemistry (IHC) IHC Expression Result: MLH1: Preserved nuclear expression (greater 50% tumor expression) MSH2: Preserved nuclear expression (greater 50% tumor expression)  MSH6: Preserved nuclear expression (greater 50% tumor expression) PMS2: Preserved nuclear expression (greater 50% tumor expression) * Internal control demonstrates intact nuclear expression Interpretation: NORMAL There is preserved expression of the major and minor MMR proteins. There is a very low probability that microsatellite instability (MSI) is present. However, certain clinically significant MMR protein mutations may result in preservation of nuclear expression. It is recommended that the preservation of protein expression be correlated with molecular based MSI testing.  RADIOGRAPHIC STUDIES: I have personally reviewed the radiological images as listed and agreed with the findings in the report.  Ct chest, Abdomen Pelvis Wo Contrast 08/04/2015 IMPRESSION: 1. Irregular mass in cyst-like appearance along the right adnexa with termination of considerable right hydroureter in this  vicinity. The rectosigmoid junction might also be tethered in this vicinity although this is uncertain. Pelvic sonography recommended for further characterization of the right ovary. The obstruction of the ureter is causing prominent right hydronephrosis. 2. Bilateral renal cysts and hepatic cysts. These appear stable. 3. Centrilobular emphysema in the lungs. The ground-glass nodule posteriorly in the right upper lobe has nearly resolved, and was probably inflammatory. 4. There new bands of sclerosis anteriorly in the left second and third ribs, and also new deformity in the upper sternal body, suspicious for healing fractures. Has the patient sustained recent trauma? 5. Flaccid and unusual appearance the urinary bladder, but stable. 6. Aortoiliac atherosclerotic vascular disease. 7. Postoperative findings in the vicinity of the proximal right colon. Borderline prominent lymph node in the sigmoid mesocolon. 8. Healed right pelvic fractures.   ASSESSMENT & PLAN: 75 year old African-American female, with past medical history of moderate dementia, independent with ADLs, hypertension, presents with small bowel obstruction and weight loss.  1. 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 -I personally reviewed her restaging CT scan from Aug 04 2015, which showed no definitive evidence of recurrence. There are several other incidental findings on the CT scan, I reviewed with patient and her daughter. -Lab results reviewed with him, she has normal CBC and a CMP, except slightly elevated creatinine. Her CEA has slightly increased from 6 to10 last week, we'll continue monitoring. -We'll continue colon cancer surveillance,I will continue following her every 3-4 months  for the first 2 years, then every 6 months for total 5 years. I'll repeat a CT scan every 6-12 months for up to 5 years  -Due to her right hydroureter issue and slightly elevated CEA, I'll see her back in 2 months with lab.  2. Anemia -Resolved after she came off chemotherapy  3. CKD stage III, right hydroureter -Her Cr was around 1 in 08/2013, Cr increased to 2.4 on 01/20/2015 when she was diagnosed with colon cancer due to dehydration, improved some and has been around 1.2-1.6 lately.  -Her recent CT scan showed right hydroureter, which probably contributes to her renal insufficiency. -I'll refer her to urologist  4. HTN, dementia -She will continue her medication and follow-up with her primary care physician.  5. Possible right ovary mass versus cyst -This was found on the surveillance CT scan on 08/04/2015 -I'll obtain a vaginal ultrasound, if is abnormal, I'll refer this gynecologist.  Plan -Vaginal ultrasound within the next week. Patient started will call me after the scan. I'll refer her to gynecologist if  the ultrasound is abnormal. -Urology referral for right hydroureter -Return to clinic in 2 months with lab including CBC, CMP and CEA.  All questions were answered. The patient knows to call the clinic with any problems, questions or concerns.  I spent 20 minutes counseling the patient face to face. The total time spent in the appointment was 25 minutes and more than 50% was on counseling.     [name], MD 08/10/2015

## 2015-08-10 NOTE — Telephone Encounter (Signed)
per pof to sch pt appt-sent referral sheet to [facility]-they will contact pt-[facility] will call about the scan-gave pt copy of avs

## 2015-08-14 ENCOUNTER — Ambulatory Visit (HOSPITAL_COMMUNITY)
Admission: RE | Admit: 2015-08-14 | Discharge: 2015-08-14 | Disposition: A | Discharge status: Home / Self Care | Payer: Medicare Other | Source: Ambulatory Visit | Attending: Hematology | Admitting: Hematology

## 2015-08-14 DIAGNOSIS — N838 Other noninflammatory disorders of ovary, fallopian tube and broad ligament: Secondary | ICD-10-CM | POA: Insufficient documentation

## 2015-08-14 DIAGNOSIS — N83201 Unspecified ovarian cyst, right side: Secondary | ICD-10-CM | POA: Diagnosis not present

## 2015-08-14 DIAGNOSIS — C182 Malignant neoplasm of ascending colon: Secondary | ICD-10-CM | POA: Diagnosis not present

## 2015-08-24 ENCOUNTER — Other Ambulatory Visit: Payer: Self-pay | Admitting: Urology

## 2015-09-03 ENCOUNTER — Ambulatory Visit: Payer: Medicare Other | Admitting: Neurology

## 2015-09-14 ENCOUNTER — Encounter (HOSPITAL_BASED_OUTPATIENT_CLINIC_OR_DEPARTMENT_OTHER): Payer: Self-pay | Admitting: *Deleted

## 2015-09-14 NOTE — Progress Notes (Signed)
SPOKE W/ DAUGHER, VANESSA (WHOM IS HCPOA).  NPO AFTER MN.  NEEDS ISTAT 8 AND EKG.  PT HAS DEMENTIA.

## 2015-09-21 ENCOUNTER — Ambulatory Visit (HOSPITAL_BASED_OUTPATIENT_CLINIC_OR_DEPARTMENT_OTHER): Payer: Medicare Other | Admitting: Anesthesiology

## 2015-09-21 ENCOUNTER — Other Ambulatory Visit: Payer: Self-pay

## 2015-09-21 ENCOUNTER — Encounter (HOSPITAL_BASED_OUTPATIENT_CLINIC_OR_DEPARTMENT_OTHER): Payer: Self-pay | Admitting: Anesthesiology

## 2015-09-21 ENCOUNTER — Ambulatory Visit (HOSPITAL_BASED_OUTPATIENT_CLINIC_OR_DEPARTMENT_OTHER)
Admission: RE | Admit: 2015-09-21 | Discharge: 2015-09-21 | Disposition: A | Discharge status: Home / Self Care | Payer: Medicare Other | Source: Ambulatory Visit | Attending: Urology | Admitting: Urology

## 2015-09-21 ENCOUNTER — Encounter (HOSPITAL_BASED_OUTPATIENT_CLINIC_OR_DEPARTMENT_OTHER)
Admission: RE | Disposition: A | Discharge status: Home / Self Care | Payer: Self-pay | Source: Ambulatory Visit | Attending: Urology

## 2015-09-21 DIAGNOSIS — F0390 Unspecified dementia without behavioral disturbance: Secondary | ICD-10-CM | POA: Diagnosis not present

## 2015-09-21 DIAGNOSIS — N183 Chronic kidney disease, stage 3 (moderate): Secondary | ICD-10-CM | POA: Insufficient documentation

## 2015-09-21 DIAGNOSIS — Z87891 Personal history of nicotine dependence: Secondary | ICD-10-CM | POA: Diagnosis not present

## 2015-09-21 DIAGNOSIS — J449 Chronic obstructive pulmonary disease, unspecified: Secondary | ICD-10-CM | POA: Diagnosis not present

## 2015-09-21 DIAGNOSIS — Z79899 Other long term (current) drug therapy: Secondary | ICD-10-CM | POA: Diagnosis not present

## 2015-09-21 DIAGNOSIS — N131 Hydronephrosis with ureteral stricture, not elsewhere classified: Secondary | ICD-10-CM | POA: Insufficient documentation

## 2015-09-21 DIAGNOSIS — M1990 Unspecified osteoarthritis, unspecified site: Secondary | ICD-10-CM | POA: Insufficient documentation

## 2015-09-21 DIAGNOSIS — K219 Gastro-esophageal reflux disease without esophagitis: Secondary | ICD-10-CM | POA: Diagnosis not present

## 2015-09-21 DIAGNOSIS — E785 Hyperlipidemia, unspecified: Secondary | ICD-10-CM | POA: Insufficient documentation

## 2015-09-21 DIAGNOSIS — I129 Hypertensive chronic kidney disease with stage 1 through stage 4 chronic kidney disease, or unspecified chronic kidney disease: Secondary | ICD-10-CM | POA: Diagnosis not present

## 2015-09-21 DIAGNOSIS — N1330 Unspecified hydronephrosis: Secondary | ICD-10-CM | POA: Diagnosis present

## 2015-09-21 HISTORY — DX: Adverse effect of unspecified anesthetic, initial encounter: T41.45XA

## 2015-09-21 HISTORY — DX: Personal history of other diseases of the digestive system: Z87.19

## 2015-09-21 HISTORY — DX: Presence of dental prosthetic device (complete) (partial): Z97.2

## 2015-09-21 HISTORY — DX: Presence of dental prosthetic device (complete) (partial): K08.109

## 2015-09-21 HISTORY — DX: Personal history of (healed) traumatic fracture: Z87.81

## 2015-09-21 HISTORY — PX: CYSTOSCOPY WITH RETROGRADE PYELOGRAM, URETEROSCOPY AND STENT PLACEMENT: SHX5789

## 2015-09-21 HISTORY — DX: Cyst of kidney, acquired: N28.1

## 2015-09-21 HISTORY — DX: Chronic kidney disease, stage 3 unspecified: N18.30

## 2015-09-21 HISTORY — DX: Malignant neoplasm of colon, unspecified: C18.9

## 2015-09-21 HISTORY — DX: Hyperlipidemia, unspecified: E78.5

## 2015-09-21 HISTORY — DX: Chronic kidney disease, stage 3 (moderate): N18.3

## 2015-09-21 HISTORY — DX: Other complications of anesthesia, initial encounter: T88.59XA

## 2015-09-21 HISTORY — DX: Unspecified osteoarthritis, unspecified site: M19.90

## 2015-09-21 HISTORY — DX: Centrilobular emphysema: J43.2

## 2015-09-21 LAB — POCT I-STAT, CHEM 8
BUN: 21 mg/dL — ABNORMAL HIGH (ref 6–20)
CALCIUM ION: 1.25 mmol/L — AB (ref 1.12–1.23)
CHLORIDE: 105 mmol/L (ref 101–111)
CREATININE: 1.4 mg/dL — AB (ref 0.44–1.00)
GLUCOSE: 89 mg/dL (ref 65–99)
HCT: 40 % (ref 36.0–46.0)
Hemoglobin: 13.6 g/dL (ref 12.0–15.0)
POTASSIUM: 4.5 mmol/L (ref 3.5–5.1)
Sodium: 144 mmol/L (ref 135–145)
TCO2: 30 mmol/L (ref 0–100)

## 2015-09-21 SURGERY — CYSTOURETEROSCOPY, WITH RETROGRADE PYELOGRAM AND STENT INSERTION
Anesthesia: General | Site: Ureter | Laterality: Right

## 2015-09-21 MED ORDER — ONDANSETRON HCL 4 MG/2ML IJ SOLN
INTRAMUSCULAR | Status: AC
  Filled 2015-09-21: qty 2

## 2015-09-21 MED ORDER — PROPOFOL 10 MG/ML IV BOLUS
INTRAVENOUS | Status: DC | PRN
  Administered 2015-09-21: 100 mg via INTRAVENOUS
  Administered 2015-09-21: 20 mg via INTRAVENOUS

## 2015-09-21 MED ORDER — FENTANYL CITRATE (PF) 100 MCG/2ML IJ SOLN
INTRAMUSCULAR | Status: DC | PRN
  Administered 2015-09-21 (×2): 25 ug via INTRAVENOUS

## 2015-09-21 MED ORDER — CEFAZOLIN SODIUM-DEXTROSE 2-4 GM/100ML-% IV SOLN
2.0000 g | INTRAVENOUS | Status: AC
  Administered 2015-09-21: 2 g via INTRAVENOUS
  Filled 2015-09-21: qty 100

## 2015-09-21 MED ORDER — DEXAMETHASONE SODIUM PHOSPHATE 10 MG/ML IJ SOLN
INTRAMUSCULAR | Status: AC
  Filled 2015-09-21: qty 1

## 2015-09-21 MED ORDER — FENTANYL CITRATE (PF) 100 MCG/2ML IJ SOLN
INTRAMUSCULAR | Status: AC
  Filled 2015-09-21: qty 2

## 2015-09-21 MED ORDER — TRAMADOL HCL 50 MG PO TABS: 50.0000 mg | ORAL_TABLET | Freq: Four times a day (QID) | ORAL | Status: DC | PRN

## 2015-09-21 MED ORDER — STERILE WATER FOR IRRIGATION IR SOLN
Status: DC | PRN
  Administered 2015-09-21: 500 mL

## 2015-09-21 MED ORDER — METOCLOPRAMIDE HCL 5 MG/ML IJ SOLN
10.0000 mg | Freq: Once | INTRAMUSCULAR | Status: DC | PRN
  Filled 2015-09-21: qty 2

## 2015-09-21 MED ORDER — LIDOCAINE HCL (CARDIAC) 20 MG/ML IV SOLN
INTRAVENOUS | Status: DC | PRN
  Administered 2015-09-21: 50 mg via INTRAVENOUS

## 2015-09-21 MED ORDER — PROPOFOL 10 MG/ML IV BOLUS
INTRAVENOUS | Status: AC
  Filled 2015-09-21: qty 20

## 2015-09-21 MED ORDER — CEFAZOLIN SODIUM-DEXTROSE 2-4 GM/100ML-% IV SOLN
INTRAVENOUS | Status: AC
  Filled 2015-09-21: qty 100

## 2015-09-21 MED ORDER — SODIUM CHLORIDE 0.9 % IV SOLN
INTRAVENOUS | Status: DC
  Administered 2015-09-21: 11:00:00 via INTRAVENOUS
  Filled 2015-09-21: qty 1000

## 2015-09-21 MED ORDER — SODIUM CHLORIDE 0.9 % IR SOLN
Status: DC | PRN
  Administered 2015-09-21: 4000 mL

## 2015-09-21 MED ORDER — CEFAZOLIN IN D5W 1 GM/50ML IV SOLN
1.0000 g | INTRAVENOUS | Status: DC
  Filled 2015-09-21: qty 50

## 2015-09-21 MED ORDER — FENTANYL CITRATE (PF) 100 MCG/2ML IJ SOLN
25.0000 ug | INTRAMUSCULAR | Status: DC | PRN
  Filled 2015-09-21: qty 1

## 2015-09-21 MED ORDER — DEXAMETHASONE SODIUM PHOSPHATE 4 MG/ML IJ SOLN
INTRAMUSCULAR | Status: DC | PRN
  Administered 2015-09-21: 4 mg via INTRAVENOUS

## 2015-09-21 MED ORDER — ONDANSETRON HCL 4 MG/2ML IJ SOLN
INTRAMUSCULAR | Status: DC | PRN
  Administered 2015-09-21: 4 mg via INTRAVENOUS

## 2015-09-21 MED ORDER — LIDOCAINE HCL (CARDIAC) 20 MG/ML IV SOLN
INTRAVENOUS | Status: AC
  Filled 2015-09-21: qty 5

## 2015-09-21 MED ORDER — DIATRIZOATE MEGLUMINE 30 % UR SOLN
URETHRAL | Status: DC | PRN
  Administered 2015-09-21: 10 mL

## 2015-09-21 SURGICAL SUPPLY — 30 items
BAG DRAIN URO-CYSTO SKYTR STRL (DRAIN) ×3 IMPLANT
BASKET DAKOTA 1.9FR 11X120 (BASKET) IMPLANT
BASKET LASER NITINOL 1.9FR (BASKET) IMPLANT
BASKET ZERO TIP NITINOL 2.4FR (BASKET) IMPLANT
BRUSH URET BIOPSY 3F (UROLOGICAL SUPPLIES) ×3 IMPLANT
CATH INTERMIT  6FR 70CM (CATHETERS) ×3 IMPLANT
CLOTH BEACON ORANGE TIMEOUT ST (SAFETY) ×3 IMPLANT
FIBER LASER FLEXIVA 365 (UROLOGICAL SUPPLIES) IMPLANT
FIBER LASER TRAC TIP (UROLOGICAL SUPPLIES) IMPLANT
GLOVE BIO SURGEON STRL SZ 6.5 (GLOVE) ×2 IMPLANT
GLOVE BIO SURGEON STRL SZ8 (GLOVE) ×3 IMPLANT
GLOVE BIO SURGEONS STRL SZ 6.5 (GLOVE) ×1
GLOVE INDICATOR 6.5 STRL GRN (GLOVE) ×6 IMPLANT
GOWN STRL REUS W/ TWL LRG LVL3 (GOWN DISPOSABLE) ×1 IMPLANT
GOWN STRL REUS W/ TWL XL LVL3 (GOWN DISPOSABLE) ×1 IMPLANT
GOWN STRL REUS W/TWL LRG LVL3 (GOWN DISPOSABLE) ×2
GOWN STRL REUS W/TWL XL LVL3 (GOWN DISPOSABLE) ×2
GUIDEWIRE ANG ZIPWIRE 038X150 (WIRE) ×3 IMPLANT
GUIDEWIRE STR DUAL SENSOR (WIRE) ×3 IMPLANT
IV NS IRRIG 3000ML ARTHROMATIC (IV SOLUTION) ×3 IMPLANT
KIT ROOM TURNOVER WOR (KITS) ×3 IMPLANT
MANIFOLD NEPTUNE II (INSTRUMENTS) ×3 IMPLANT
PACK CYSTO (CUSTOM PROCEDURE TRAY) ×3 IMPLANT
STENT URET 6FRX24 CONTOUR (STENTS) ×3 IMPLANT
STENT URET 6FRX26 CONTOUR (STENTS) IMPLANT
SYRINGE 10CC LL (SYRINGE) ×3 IMPLANT
TUBE CONNECTING 12'X1/4 (SUCTIONS) ×1
TUBE CONNECTING 12X1/4 (SUCTIONS) ×2 IMPLANT
TUBE FEEDING 8FR 16IN STR KANG (MISCELLANEOUS) ×3 IMPLANT
WATER STERILE IRR 500ML POUR (IV SOLUTION) ×3 IMPLANT

## 2015-09-21 NOTE — H&P (Signed)
Urology Admission H&P  Chief Complaint: hydronephrosis  History of Present Illness: Beth Wilkins is a 75yo with a hx of right hemicolectomy who was found to have right hydronephrosis and a workup for microheamturia. She denies any flank pain. She denies any gross hematuria.  Past Medical History  Diagnosis Date  . Hypertension   . Hyperlipidemia   . Hydronephrosis, right   . Dementia     MODERATE  . Bilateral renal cysts   . Centrilobular emphysema ([place])   . History of pelvic fracture   . Colon cancer [facility]) oncologist-  dr [name]    dx 01-23-2015  right colon Invasive adenocarcinoma into pericecal connective tissue and involving small intestine , Stage IIc, Grade 2, LVI(-),  (pT4b N0 M0) /  s/p resection termial ileum and cecum and chemotherapy 01-06-217 to 07-06-2015  . CKD (chronic kidney disease), stage III   . Complication of anesthesia     can be combative due to dementia  . History of gastroesophageal reflux (GERD)     changes diet  . Arthritis   . Chronic low back pain   . Full dentures    Past Surgical History  Procedure Laterality Date  . Laparoscopy N/A 01/23/2015    Procedure: DIAGNOSTIC LAPAROSCOPY, EXPLORATORY LAPAROTOMY  RIGHT HEMI-COLECTOMY FOR OBSTRUCTION OF RIGHT TERMINAL ILEUM;  Surgeon: [name], MD;  Location: [facility];  Service: General;  Laterality: N/A;  . Cataract extraction w/ intraocular lens  implant, bilateral  2005 approx    Home Medications:  Prescriptions prior to admission  Medication Sig Dispense Refill Last Dose  . amLODipine (NORVASC) 5 MG tablet Take 5 mg by mouth every evening.    09/21/2015 at 0915  . antiseptic oral rinse (BIOTENE) LIQD 15 mLs by Mouth Rinse route daily. BIOTENE CLINICAL   09/20/2015 at Unknown time  . Cholecalciferol (VITAMIN D3) 2000 UNITS TABS Take by mouth daily.   09/20/2015 at Unknown time  . Coenzyme Q10 (CO Q 10) 100 MG CAPS Take by mouth daily.   09/20/2015 at Unknown time  . donepezil (ARICEPT) 10 MG tablet  Take 1 tab daily (Patient taking differently: Take 10 mg by mouth every evening. Take 1 tab daily) 30 tablet 11 09/20/2015 at Unknown time  . MULTIPLE VITAMIN PO Take by mouth daily. Take Geritol - MVI with Iron.  Take 1 Tbsp daily.   09/20/2015 at Unknown time  . simvastatin (ZOCOR) 20 MG tablet Take 20 mg by mouth daily at 6 PM.   09/20/2015 at Unknown time  . polyethylene glycol (MIRALAX / GLYCOLAX) packet Take 17 g by mouth daily. (Patient taking differently: Take 17 g by mouth daily as needed. ) 14 each 0 More than a month at Unknown time   Allergies:  Allergies  Allergen Reactions  . Namenda [Memantine Hcl] Other (See Comments)    hallucinations  . Oxycodone Other (See Comments)    Hallucinations     Family History  Problem Relation Age of Onset  . Diabetes Mellitus II Father   . Cervical cancer Other   . Cancer Mother 54    ovarian cancer    Social History:  reports that she quit smoking about 7 months ago. Her smoking use included Cigarettes. She has a 12.5 pack-year smoking history. She has never used smokeless tobacco. She reports that she drinks about 1.8 oz of alcohol per week. She reports that she does not use illicit drugs.  Review of Systems  All other systems reviewed and are negative.  Physical Exam:  Vital signs in last 24 hours: Temp:  [97.7 F (36.5 C)] 97.7 F (36.5 C) (07/17 1010) Pulse Rate:  [61] 61 (07/17 1010) Resp:  [16] 16 (07/17 1010) BP: (143)/(72) 143/72 mmHg (07/17 1010) SpO2:  [100 %] 100 % (07/17 1010) Weight:  [56.246 kg (124 lb)] 56.246 kg (124 lb) (07/17 1010) Physical Exam  Constitutional: She is oriented to person, place, and time. She appears well-developed and well-nourished.  HENT:  Head: Normocephalic and atraumatic.  Eyes: EOM are normal. Pupils are equal, round, and reactive to light.  Neck: Normal range of motion. No thyromegaly present.  Cardiovascular: Normal rate and regular rhythm.   Respiratory: Effort normal. No  respiratory distress.  GI: Soft. She exhibits no distension.  Musculoskeletal: Normal range of motion.  Neurological: She is alert and oriented to person, place, and time.  Skin: Skin is warm and dry.  Psychiatric: She has a normal mood and affect. Her behavior is normal. Judgment and thought content normal.    Laboratory Data:  Results for orders placed or performed during the hospital encounter of 09/21/15 (from the past 24 hour(s))  I-STAT, chem 8     Status: Abnormal   Collection Time: 09/21/15 10:52 AM  Result Value Ref Range   Sodium 144 135 - 145 mmol/L   Potassium 4.5 3.5 - 5.1 mmol/L   Chloride 105 101 - 111 mmol/L   BUN 21 (H) 6 - 20 mg/dL   Creatinine, Ser 1.40 (H) 0.44 - 1.00 mg/dL   Glucose, Bld 89 65 - 99 mg/dL   Calcium, Ion 1.25 (H) 1.12 - 1.23 mmol/L   TCO2 30 0 - 100 mmol/L   Hemoglobin 13.6 12.0 - 15.0 g/dL   HCT 40.0 36.0 - 46.0 %   No results found for this or any previous visit (from the past 240 hour(s)). Creatinine:  Recent Labs  09/21/15 1052  CREATININE 1.40*   Baseline Creatinine: 1.4  Impression/Assessment:  74yo with right hydronephrosis  Plan:  The risks/benefits/alternatives to cystoscopy, right retrograde, right ureteroscopy, possible biopsy, possible balloon dilation with stent placement was explained to the patient and she understands and wishes to proceed with surgery  [name] 09/21/2015, 11:39 AM

## 2015-09-21 NOTE — Brief Op Note (Signed)
09/21/2015  12:37 PM  PATIENT:  Beth Wilkins  75 y.o. female  PRE-OPERATIVE DIAGNOSIS:  RIGHT HYDRONEPHROSIS  POST-OPERATIVE DIAGNOSIS:  RIGHT HYDRONEPHROSIS  PROCEDURE:  Procedure(s): CYSTOSCOPY WITH RETROGRADE PYELOGRAM, URETEROSCOPY,POSSIBLE STENT PLACEMENT AND POSSIBLE BALLOON DILATION (Right)  SURGEON:  Surgeon(s) and Role:    * Cleon Gustin, MD - Primary  PHYSICIAN ASSISTANT:   ASSISTANTS: none   ANESTHESIA:   general  EBL:  Total I/O In: 600 [I.V.:600] Out: 0   BLOOD ADMINISTERED:none  DRAINS: right 6x24 JJ ureteral stent  LOCAL MEDICATIONS USED:  NONE  SPECIMEN:  Source of Specimen:  rigth ureter brush biopsy  DISPOSITION OF SPECIMEN:  PATHOLOGY  COUNTS:  YES  TOURNIQUET:  * No tourniquets in log *  DICTATION: .Note written in EPIC  PLAN OF CARE: Discharge to home after PACU  PATIENT DISPOSITION:  PACU - hemodynamically stable.   Delay start of Pharmacological VTE agent (>24hrs) due to surgical blood loss or risk of bleeding: not applicable

## 2015-09-21 NOTE — Transfer of Care (Signed)
Last Vitals:  Filed Vitals:   09/21/15 1010  BP: 143/72  Pulse: 61  Temp: 36.5 C  Resp: 16    Last Pain: There were no vitals filed for this visit.  Immediate Anesthesia Transfer of Care Note  Patient: Beth Wilkins  Procedure(s) Performed: Procedure(s) (LRB): CYSTOSCOPY WITH RETROGRADE PYELOGRAM, URETEROSCOPY,POSSIBLE STENT PLACEMENT AND POSSIBLE BALLOON DILATION (Right)  Patient Location: PACU  Anesthesia Type: General  Level of Consciousness: awake, alert  and oriented  Airway & Oxygen Therapy: Patient Spontanous Breathing and Patient connected to face mask oxygen  Post-op Assessment: Report given to PACU RN and Post -op Vital signs reviewed and stable  Post vital signs: Reviewed and stable  Complications: No apparent anesthesia complications  Patients Stated Pain Goal: 5 (09/21/15 1026)

## 2015-09-21 NOTE — Anesthesia Procedure Notes (Signed)
Procedure Name: LMA Insertion Date/Time: 09/21/2015 11:49 AM Performed by: Mechele Claude Pre-anesthesia Checklist: Patient identified, Emergency Drugs available, Suction available and Patient being monitored Patient Re-evaluated:Patient Re-evaluated prior to inductionOxygen Delivery Method: Circle System Utilized Preoxygenation: Pre-oxygenation with 100% oxygen Intubation Type: IV induction Ventilation: Mask ventilation without difficulty LMA: LMA inserted LMA Size: 4.0 Number of attempts: 1 Airway Equipment and Method: bite block Placement Confirmation: positive ETCO2 Tube secured with: Tape Dental Injury: Teeth and Oropharynx as per pre-operative assessment

## 2015-09-21 NOTE — Anesthesia Postprocedure Evaluation (Signed)
Anesthesia Post Note  Patient: Beth Wilkins  Procedure(s) Performed: Procedure(s) (LRB): CYSTOSCOPY WITH BIL RETROGRADE PYELOGRAM, URETEROSCOPY, RIGHT STENT PLACEMENT  (Right)  Patient location during evaluation: PACU Anesthesia Type: General Level of consciousness: awake and alert and sedated Pain management: pain level controlled Vital Signs Assessment: post-procedure vital signs reviewed and stable Respiratory status: spontaneous breathing, nonlabored ventilation and respiratory function stable Cardiovascular status: blood pressure returned to baseline and stable Postop Assessment: no signs of nausea or vomiting Anesthetic complications: no    Last Vitals:  Filed Vitals:   09/21/15 1315 09/21/15 1404  BP: 138/62 152/71  Pulse: 68 61  Temp:  36.7 C  Resp: 14 14    Last Pain:  Filed Vitals:   09/21/15 1404  PainSc: 0-No pain                 [name] A.

## 2015-09-21 NOTE — Anesthesia Preprocedure Evaluation (Addendum)
Anesthesia Evaluation  Patient identified by MRN, date of birth, ID band Patient awake    Reviewed: Allergy & Precautions, NPO status , Patient's Chart, lab work & pertinent test results  History of Anesthesia Complications (+) history of anesthetic complications  Airway Mallampati: III  TM Distance: >3 FB Neck ROM: Full    Dental  (+) Lower Dentures, Upper Dentures   Pulmonary COPD, former smoker,    Pulmonary exam normal breath sounds clear to auscultation       Cardiovascular hypertension, Pt. on medications and Pt. on home beta blockers Normal cardiovascular exam Rhythm:Regular Rate:Normal     Neuro/Psych PSYCHIATRIC DISORDERS Dementia    GI/Hepatic Neg liver ROS, GERD  Medicated and Controlled,Hx/o colon Ca - S/P resection of terminal ileum   Endo/Other  negative endocrine ROS  Renal/GU ARFRenal diseaseRight Hydronephrosis  negative genitourinary   Musculoskeletal  (+) Arthritis , Osteoarthritis,  Chronic LBP   Abdominal   Peds  Hematology  (+) anemia ,   Anesthesia Other Findings   Reproductive/Obstetrics                           Anesthesia Physical Anesthesia Plan  ASA: III  Anesthesia Plan: General   Post-op Pain Management:    Induction: Intravenous  Airway Management Planned: LMA  Additional Equipment:   Intra-op Plan:   Post-operative Plan: Extubation in OR  Informed Consent: I have reviewed the patients History and Physical, chart, labs and discussed the procedure including the risks, benefits and alternatives for the proposed anesthesia with the patient or authorized representative who has indicated his/her understanding and acceptance.   Dental advisory given  Plan Discussed with: CRNA, Anesthesiologist and Surgeon  Anesthesia Plan Comments:         Anesthesia Quick Evaluation

## 2015-09-22 ENCOUNTER — Encounter (HOSPITAL_BASED_OUTPATIENT_CLINIC_OR_DEPARTMENT_OTHER): Payer: Self-pay | Admitting: Urology

## 2015-09-30 NOTE — Op Note (Signed)
Preoperative diagnosis: Right hydronephrosis  Postoperative diagnosis: Same  Procedure: 1 cystoscopy 2.  right retrograde pyelography 3.  Intraoperative fluoroscopy, under one hour, with interpretation 4.  Right diagnostic ureteroscopy 5. Right ureteral brush biopsy 6. 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 Severe proximal hydroureteronephrosis. Tenting of the right ureter in the bladder. .  Indications: Patient is a 75 year old female with a history of hydronephrosis.  After discussing treatment options, she decided proceed with right diagnostic ureteroscopy  Procedure her in detail: The patient was brought to the operating room and a brief timeout was done to ensure correct patient, correct procedure, correct site.  General anesthesia was administered patient was placed in dorsal lithotomy position.  Her genitalia was then prepped and draped in usual sterile fashion.  A rigid 33 French cystoscope was passed in the urethra and the bladder.  Bladder was inspected free masses or lesions.  the right ureteral orifices were in the normal orthotopic locations.  a 6 french ureteral catheter was then instilled into the right ureter orifice.  a gentle retrograde was obtained and findings noted above.  we then placed a zip wire through the ureteral catheter and advanced up to the renal pelvis.  we then removed the cystoscope and cannulated the right ureteral orifice with a semirigid ureteroscope.  we then performed ureteroscopy and noted a dense 1cm stricture at the mid ureter with erythema. We then used a brush biopsy to brush the area and obtain a specimen which was sent for pathology. We then performed ureteroscopy up to the level of the UPJ. No stone or tumor was  encountered. We then elected to place a stent over the original zipwire. We advanced the stent over the wire up to the renal pelvis. We removed the wire and good coil was noted in the renal pelvis under fluoroscopy and the bladder under direct vision. the bladder was then drained and this concluded the procedure which was well tolerated by patient.  Complications: None  Condition: Stable, extubated, transferred to PACU  Plan: Pt is to followup in 2 weeks for stent removal

## 2015-10-12 ENCOUNTER — Other Ambulatory Visit (HOSPITAL_BASED_OUTPATIENT_CLINIC_OR_DEPARTMENT_OTHER): Payer: Medicare Other

## 2015-10-12 ENCOUNTER — Ambulatory Visit (HOSPITAL_BASED_OUTPATIENT_CLINIC_OR_DEPARTMENT_OTHER): Payer: Medicare Other | Admitting: Hematology

## 2015-10-12 ENCOUNTER — Telehealth: Payer: Self-pay | Admitting: Hematology

## 2015-10-12 VITALS — BP 151/85 | HR 80 | Temp 98.3°F | Resp 16 | Ht 66.0 in | Wt 127.5 lb

## 2015-10-12 DIAGNOSIS — N183 Chronic kidney disease, stage 3 (moderate): Secondary | ICD-10-CM | POA: Diagnosis not present

## 2015-10-12 DIAGNOSIS — C180 Malignant neoplasm of cecum: Secondary | ICD-10-CM | POA: Diagnosis not present

## 2015-10-12 DIAGNOSIS — F0390 Unspecified dementia without behavioral disturbance: Secondary | ICD-10-CM

## 2015-10-12 DIAGNOSIS — I10 Essential (primary) hypertension: Secondary | ICD-10-CM | POA: Diagnosis not present

## 2015-10-12 DIAGNOSIS — D638 Anemia in other chronic diseases classified elsewhere: Secondary | ICD-10-CM

## 2015-10-12 DIAGNOSIS — N83299 Other ovarian cyst, unspecified side: Secondary | ICD-10-CM

## 2015-10-12 DIAGNOSIS — C182 Malignant neoplasm of ascending colon: Secondary | ICD-10-CM

## 2015-10-12 LAB — CBC WITH DIFFERENTIAL/PLATELET
BASO%: 0.6 % (ref 0.0–2.0)
BASOS ABS: 0 10*3/uL (ref 0.0–0.1)
EOS ABS: 0.3 10*3/uL (ref 0.0–0.5)
EOS%: 3.9 % (ref 0.0–7.0)
HEMATOCRIT: 38.4 % (ref 34.8–46.6)
HEMOGLOBIN: 12.9 g/dL (ref 11.6–15.9)
LYMPH%: 24.1 % (ref 14.0–49.7)
MCH: 29.3 pg (ref 25.1–34.0)
MCHC: 33.6 g/dL (ref 31.5–36.0)
MCV: 87.1 fL (ref 79.5–101.0)
MONO#: 0.3 10*3/uL (ref 0.1–0.9)
MONO%: 5.1 % (ref 0.0–14.0)
NEUT#: 4.4 10*3/uL (ref 1.5–6.5)
NEUT%: 66.3 % (ref 38.4–76.8)
Platelets: 283 10*3/uL (ref 145–400)
RBC: 4.41 10*6/uL (ref 3.70–5.45)
RDW: 14.9 % — AB (ref 11.2–14.5)
WBC: 6.6 10*3/uL (ref 3.9–10.3)
lymph#: 1.6 10*3/uL (ref 0.9–3.3)

## 2015-10-12 LAB — COMPREHENSIVE METABOLIC PANEL
ALBUMIN: 3.9 g/dL (ref 3.5–5.0)
ALK PHOS: 69 U/L (ref 40–150)
ALT: <9 U/L (ref 0–55)
AST: 16 U/L (ref 5–34)
Anion Gap: 12 mEq/L — ABNORMAL HIGH (ref 3–11)
BUN: 20.1 mg/dL (ref 7.0–26.0)
CALCIUM: 10.5 mg/dL — AB (ref 8.4–10.4)
CHLORIDE: 107 meq/L (ref 98–109)
CO2: 24 mEq/L (ref 22–29)
Creatinine: 1.3 mg/dL — ABNORMAL HIGH (ref 0.6–1.1)
EGFR: 45 mL/min/{1.73_m2} — AB (ref >90)
Glucose: 138 mg/dl (ref 70–140)
POTASSIUM: 3.6 meq/L (ref 3.5–5.1)
SODIUM: 143 meq/L (ref 136–145)
Total Bilirubin: 0.84 mg/dL (ref 0.20–1.20)
Total Protein: 7.9 g/dL (ref 6.4–8.3)

## 2015-10-12 LAB — CEA (IN HOUSE-CHCC): CEA (CHCC-IN HOUSE): 13.29 ng/mL — AB (ref 0.00–5.00)

## 2015-10-12 NOTE — Progress Notes (Signed)
[place]  [phone] [phone]  Clinic Follow Up Note   Patient Care Team: [name], MD as PCP - General (Family Medicine) [name], MD as Consulting Physician (Medical Oncology) [name], MD as Consulting Physician (General Surgery) [name], MD as Consulting Physician (Orthopedic Surgery) 10/12/2015     CHIEF COMPLAINTS:  Follow up stage II colon cancer  Oncology History   Cancer of right colon [facility])   Staging form: Colon and Rectum, AJCC 7th Edition     Pathologic stage from 01/23/2015: Stage IIC (T4b, N0, cM0) - Signed by [name], MD on 02/05/2015       Cancer of right colon ([place])   01/21/2015 Imaging     CT abdomen and pelvis showed high-grade small bowel obstruction,  right I [place] ureteral nephrosis,  multiple liver cysts.  CT chest was negative for metastatic lesion.     01/23/2015 Pathology Results     invasive adenocarcinoma extending into the pericecal connective tissue,  grade 2, LVI (-),  no perineural invasion, 3 lymph nodes were negative.     01/23/2015 Surgery      terminal ileum and cecum seegmental resection with anastomosis by Dr. Hassell Done     01/23/2015 Initial Diagnosis    Cancer of right colon ([place])     03/13/2015 - 07/06/2015 Chemotherapy    Xeloda 1500 mg bid X 14 days on-7 off, s/p 5 cycles, stopped early due to wrosening renal function      HISTORY OF PRESENTING ILLNESS (02/05/2015):  Beth Wilkins 75 y.o. female  with past medical history of moderate dementia, hypertension, is here because of recently diagnosed stage II colon cancer. She is accompanied to the clinic by her daughter. History of manic is from the chart and her daughter.  She has had abdominal discomfort, nausea and vomiting, and norexia for  the past to 3 months along with 15 pounds weight loss, . Her nausea was mild and intermittent, she does not seek medical attention initially. She presented with  worsening symptoms for 2-3  weeks, and episodes of projectile vomiting and dehydration to emergency room, was admitted to [facility] on 01/20/2015. CT scan reviewed small bowel obstruction, and possible appendiceal rupture. She was brought to OR on 01/23/2015, underwent right hemicolectomy with anastomosis by Dr. Hassell Done. Surgical path reviewed adenocarcinoma at the cecum. She was discharged home on 01/26/2015.  She has recovered well, eating much better than prior to surgery, move around without restriction. No pain, she has loose BM 3 times a day, and she has backed off her mirealax.  she lives with her daughter. She states her appetite is moderate, sometime low, and she refuses to eat if she doesn't have appetite. No nausea, vomiting, abdominal bloating since her surgery.  Her last colonoscopy was 6-8 years ago, and her stool OB was negative this year.   CURRENT THERAPY: Observation  INTERIM HISTORY: Beth Wilkins returns for follow-up. She is accompanied by her daughter to clinic today. She had her ureter stent removed today by her urologist. She is doing well overall, denies any pain, nausea, abdominal discomfort with new symptoms. She has decent appetite and energy level, weight is stable.  MEDICAL HISTORY:  Past Medical History:  Diagnosis Date  . Arthritis   . Bilateral renal cysts   . Centrilobular emphysema ([place])   . Chronic low back pain   . CKD (chronic kidney disease), stage III   . Colon cancer [facility]) oncologist-  dr [name]  dx 01-23-2015  right colon Invasive adenocarcinoma into pericecal connective tissue and involving small intestine , Stage IIc, Grade 2, LVI(-),  (pT4b N0 M0) /  s/p resection termial ileum and cecum and chemotherapy 01-06-217 to 07-06-2015  . Complication of anesthesia    can be combative due to dementia  . Dementia    MODERATE  . Full dentures   . History of gastroesophageal reflux (GERD)    changes diet  . History of pelvic fracture   . Hydronephrosis, right   . Hyperlipidemia   .  Hypertension     SURGICAL HISTORY: Past Surgical History:  Procedure Laterality Date  . CATARACT EXTRACTION W/ INTRAOCULAR LENS  IMPLANT, BILATERAL  2005 approx  . CYSTOSCOPY WITH RETROGRADE PYELOGRAM, URETEROSCOPY AND STENT PLACEMENT Right 09/21/2015   Procedure: CYSTOSCOPY WITH BIL RETROGRADE PYELOGRAM, URETEROSCOPY, RIGHT STENT PLACEMENT ;  Surgeon: Cleon Gustin, MD;  Location[facility];  Service: Urology;  Laterality: Right;  . LAPAROSCOPY N/A 01/23/2015   Procedure: DIAGNOSTIC LAPAROSCOPY, EXPLORATORY LAPAROTOMY  RIGHT HEMI-COLECTOMY FOR OBSTRUCTION OF RIGHT TERMINAL ILEUM;  Surgeon: [name], MD;  Location: [facility];  Service: General;  Laterality: N/A;    SOCIAL HISTORY: Social History   Social History  . Marital status: Single    Spouse name: N/A  . Number of children: N/A  . Years of education: N/A   Occupational History  . Not on file.   Social History Main Topics  . Smoking status: Former Smoker    Packs/day: 0.25    Years: 50.00    Types: Cigarettes    Quit date: 01/25/2015  . Smokeless tobacco: Never Used  . Alcohol use 1.8 oz/week    2 Glasses of wine, 1 Cans of beer per week     Comment: occasional   . Drug use: No  . Sexual activity: Not on file   Other Topics Concern  . Not on file   Social History Narrative   Legally separated   Lives w/daughter, Allahna Husband   Has total of #2 daughters and #1 son   Dementia   Fall Risk-ambulates w/cane    FAMILY HISTORY: Family History  Problem Relation Age of Onset  . Diabetes Mellitus II Father   . Cervical cancer Other   . Cancer Mother 8    ovarian cancer     ALLERGIES:  is allergic to namenda [memantine hcl] and oxycodone.  MEDICATIONS:  Current Outpatient Prescriptions  Medication Sig Dispense Refill  . amLODipine (NORVASC) 5 MG tablet Take 5 mg by mouth every evening.     Marland Kitchen antiseptic oral rinse (BIOTENE) LIQD 15 mLs by Mouth Rinse route daily. BIOTENE CLINICAL     . Cholecalciferol (VITAMIN D3) 2000 UNITS TABS Take by mouth daily.    . Coenzyme Q10 (CO Q 10) 100 MG CAPS Take by mouth daily.    Marland Kitchen donepezil (ARICEPT) 10 MG tablet Take 1 tab daily (Patient taking differently: Take 10 mg by mouth every evening. Take 1 tab daily) 30 tablet 11  . MULTIPLE VITAMIN PO Take by mouth daily. Take Geritol - MVI with Iron.  Take 1 Tbsp daily.    . polyethylene glycol (MIRALAX / GLYCOLAX) packet Take 17 g by mouth daily. (Patient taking differently: Take 17 g by mouth daily as needed. ) 14 each 0  . simvastatin (ZOCOR) 20 MG tablet Take 20 mg by mouth daily at 6 PM.    . traMADol (ULTRAM) 50 MG tablet Take 1 tablet (50 mg  total) by mouth every 6 (six) hours as needed. 30 tablet 0   No current facility-administered medications for this visit.     REVIEW OF SYSTEMS:   Constitutional: Denies fevers, chills or abnormal night sweats Eyes: Denies blurriness of vision, double vision or watery eyes Ears, nose, mouth, throat, and face: Denies mucositis or sore throat Respiratory: Denies cough, dyspnea or wheezes Cardiovascular: Denies palpitation, chest discomfort or lower extremity swelling Gastrointestinal:  Denies nausea, heartburn or change in bowel habits Skin: Denies abnormal skin rashes Lymphatics: Denies new lymphadenopathy or easy bruising Neurological:Denies numbness, tingling or new weaknesses Behavioral/Psych: Mood is stable, no new changes  All other systems were reviewed with the patient and are negative.  PHYSICAL EXAMINATION: ECOG PERFORMANCE STATUS: 1 - Symptomatic but completely ambulatory  Vitals:   10/12/15 1412  BP: (!) 151/85  Pulse: 80  Resp: 16  Temp: 98.3 F (36.8 C)   Filed Weights   10/12/15 1412  Weight: 127 lb 8 oz (57.8 kg)    GENERAL:alert, no distress and comfortable SKIN: skin color, texture, turgor are normal, no rashes or significant lesions EYES: normal, conjunctiva are pink and non-injected, sclera clear OROPHARYNX:no  exudate, no erythema and lips, buccal mucosa, and tongue normal  NECK: supple, thyroid normal size, non-tender, without nodularity LYMPH:  no palpable lymphadenopathy in the cervical, axillary or inguinal LUNGS: clear to auscultation and percussion with normal breathing effort HEART: regular rate & rhythm and no murmurs and no lower extremity edema ABDOMEN:abdomen soft, non-tender and normal bowel sounds Musculoskeletal:no cyanosis of digits and no clubbing  PSYCH: alert & oriented x 3 with fluent speech NEURO: no focal motor/sensory deficits  LABORATORY DATA:  I have reviewed the data as listed CBC Latest Ref Rng & Units 10/12/2015 09/21/2015 08/04/2015  WBC 3.9 - 10.3 10e3/uL 6.6 - 6.8  Hemoglobin 11.6 - 15.9 g/dL 12.9 13.6 12.4  Hematocrit 34.8 - 46.6 % 38.4 40.0 37.5  Platelets 145 - 400 10e3/uL 283 - 287    CMP Latest Ref Rng & Units 10/12/2015 09/21/2015 08/04/2015  Glucose 70 - 140 mg/dl 138 89 90  BUN 7.0 - 26.0 mg/dL 20.1 21(H) 24.7  Creatinine 0.6 - 1.1 mg/dL 1.3(H) 1.40(H) 1.6(H)  Sodium 136 - 145 mEq/L 143 144 143  Potassium 3.5 - 5.1 mEq/L 3.6 4.5 3.6  Chloride 101 - 111 mmol/L - 105 -  CO2 22 - 29 mEq/L 24 - 27  Calcium 8.4 - 10.4 mg/dL 10.5(H) - 9.7  Total Protein 6.4 - 8.3 g/dL 7.9 - 8.1  Total Bilirubin 0.20 - 1.20 mg/dL 0.84 - 1.01  Alkaline Phos 40 - 150 U/L 69 - 63  AST 5 - 34 U/L 16 - 17  ALT 0 - 55 U/L <9 - <9   CEA  Order: [id number]  Status:  Final result Visible to patient:  No (Not Released) Next appt:  12/28/2015 at 02:00 PM in Oncology [facility] Lab 4)    Ref Range & Units 1d ago 21moago   CEA 0.0 - 4.7 ng/mL 14.2  10.3CM          PATHOLOGY REPORT: Diagnosis 01/23/2015 Colon, segmental resection, with terminal ileum and cecum - INVASIVE ADENOCARCINOMA EXTENDING INTO PERICECAL CONNECTIVE TISSUE AND INVOLVING SMALL INTESTINE. - THREE BENIGN LYMPH NODES (0/3). - SEE ONCOLOGY TABLE BELOW.  Microscopic Comment COLON AND RECTUM (INCLUDING  TRANS-ANAL RESECTION): Specimen: Terminal ileum and cecum. Procedure: Resection. Tumor site: Cecum adjacent to appendix. Specimen integrity: Intact. Macroscopic intactness of mesorectum: Not applicable. Macroscopic  tumor perforation: No. Invasive tumor: Maximum size: 4.0 cm. Histologic type(s): Colorectal adenocarcinoma. Histologic grade and differentiation: G2: moderately differentiated/low grade. Type of polyp in which invasive carcinoma arose: No residual polyp. Microscopic extension of invasive tumor: Into pericecal connective tissue and terminal ileum. Lymph-Vascular invasion: Not identified. Peri-neural invasion: Not identified. Tumor deposit(s) (discontinuous extramural extension): No. Resection margins: Proximal margin: Free of tumor. Distal margin: Free of tumor. Circumferential (radial) (posterior ascending, posterior descending; lateral and posterior mid-rectum; and entire lower 1/3 rectum): Free of tumor. Mesenteric margin (sigmoid and transverse): N/A. Distance closest margin (if all above margins negative): N/A. Treatment effect (neo-adjuvant therapy): No. Additional polyp(s): No. Non-neoplastic findings: N/A. Lymph nodes: number examined 3; number positive: 0. Pathologic Staging: pT4b, pN0, pMX. Ancillary studies: Mismatch repair protein by immunohistochemistry pending. Comments: There is a colorectal-type adenocarcinoma arising in the cecum in the area of the appendiceal orifice. The tumor extends into the pericecal connective tissue, and involves the attached segment of terminal ileum. Immunohistochemistry shows the tumor is positive with CDX2 and cytokeratin 20, and negative with cytokeratin 7, estrogen receptor and WT-1 supporting a diagnosis of primary colorectal adenocarcinoma. ADDITIONAL INFORMATION: Mismatch Repair (MMR) Protein Immunohistochemistry (IHC) IHC Expression Result: MLH1: Preserved nuclear expression (greater 50% tumor expression) MSH2:  Preserved nuclear expression (greater 50% tumor expression) MSH6: Preserved nuclear expression (greater 50% tumor expression) PMS2: Preserved nuclear expression (greater 50% tumor expression) * Internal control demonstrates intact nuclear expression Interpretation: NORMAL There is preserved expression of the major and minor MMR proteins. There is a very low probability that microsatellite instability (MSI) is present. However, certain clinically significant MMR protein mutations may result in preservation of nuclear expression. It is recommended that the preservation of protein expression be correlated with molecular based MSI testing.  RADIOGRAPHIC STUDIES: I have personally reviewed the radiological images as listed and agreed with the findings in the report.  Ct chest, Abdomen Pelvis Wo Contrast 08/04/2015 IMPRESSION: 1. Irregular mass in cyst-like appearance along the right adnexa with termination of considerable right hydroureter in this vicinity. The rectosigmoid junction might also be tethered in this vicinity although this is uncertain. Pelvic sonography recommended for further characterization of the right ovary. The obstruction of the ureter is causing prominent right hydronephrosis. 2. Bilateral renal cysts and hepatic cysts. These appear stable. 3. Centrilobular emphysema in the lungs. The ground-glass nodule posteriorly in the right upper lobe has nearly resolved, and was probably inflammatory. 4. There new bands of sclerosis anteriorly in the left second and third ribs, and also new deformity in the upper sternal body, suspicious for healing fractures. Has the patient sustained recent trauma? 5. Flaccid and unusual appearance the urinary bladder, but stable. 6. Aortoiliac atherosclerotic vascular disease. 7. Postoperative findings in the vicinity of the proximal right colon. Borderline prominent lymph node in the sigmoid mesocolon. 8. Healed right pelvic  fractures.  US pelvis 08/14/2015 IMPRESSION: Asymmetric enlargement of the right ovary which contains a cystic and solid lesion. This scratch set the size of the left ovary is considered abnormal for a postmenopausal female. Cannot rule out underlying benign or malignant cystic ovarian neoplasm. Consider further investigation with either surgical resection or contrast enhanced MRI.   ASSESSMENT & PLAN: 75 year old African-American female, with past medical history of moderate dementia, independent with ADLs, hypertension, presents with small bowel obstruction and weight loss.  1. 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 -I personally reviewed her restaging CT scan from Aug 04 2015, which showed no definitive evidence of recurrence. However there is a new right ovarian cyst . She underwent pelvic ultrasound, which showed asymmetric enlargement of the right ovary which contains a cystic and solid lesion. I'll refer her to gyn oncology for evaluation  -Lab results reviewed with him, she has normal CBC and a CMP, her creatinine has improved. Her CEA has slightly increased to 14.2 today, we'll continue monitoring. If further increase on next test, will obtain repeated scan   2. Anemia -Resolved after she came off chemotherapy  3. CKD stage III, right hydroureter -Her Cr was around 1 in 08/2013, Cr increased to 2.4 on 01/20/2015 when she was diagnosed with colon cancer due to dehydration, improved some and has been around 1.2-1.6 lately.  -Her recent CT scan showed right hydroureter, which probably contributes to her renal insufficiency. -she was seen by urologist and had temperate stent which was removed   4. HTN, dementia -She will continue her medication and  follow-up with her primary care physician.  5. Possible right ovary mass versus cyst -This was found on the surveillance CT scan on 08/04/2015 -gyn onc referral, rule out ovarian cancer or metastatic colon cancer  -will add CA125 to her lab draw today   Plan -gyn onc referral for new right ovarian cystic lesion  -Return to clinic in 3 months with lab including CBC, CMP and CEA,  And repeated CT abdomen and pelvis with oral contrast, sooner if her GYN workup shows malignancy.  All questions were answered. The patient knows to call the clinic with any problems, questions or concerns.  I spent 20 minutes counseling the patient face to face. The total time spent in the appointment was 25 minutes and more than 50% was on counseling.     [name], MD 10/12/2015

## 2015-10-12 NOTE — Telephone Encounter (Signed)
Gv pt appts for lab 10/23 + md 10/30. Advised radiology will call with ct scan. Gv contrast.

## 2015-10-13 ENCOUNTER — Encounter: Payer: Self-pay | Admitting: Hematology

## 2015-10-13 LAB — CA 125: Cancer Antigen (CA) 125: 6.1 U/mL (ref 0.0–38.1)

## 2015-10-13 LAB — CEA: CEA1: 14.2 ng/mL — AB (ref 0.0–4.7)

## 2015-10-16 ENCOUNTER — Encounter (HOSPITAL_COMMUNITY): Payer: Self-pay

## 2015-10-16 ENCOUNTER — Emergency Department (HOSPITAL_COMMUNITY)
Admission: EM | Admit: 2015-10-16 | Discharge: 2015-10-17 | Disposition: A | Discharge status: Home / Self Care | Payer: Medicare Other | Attending: Emergency Medicine | Admitting: Emergency Medicine

## 2015-10-16 DIAGNOSIS — H5712 Ocular pain, left eye: Secondary | ICD-10-CM | POA: Diagnosis present

## 2015-10-16 DIAGNOSIS — N183 Chronic kidney disease, stage 3 (moderate): Secondary | ICD-10-CM | POA: Insufficient documentation

## 2015-10-16 DIAGNOSIS — Z85038 Personal history of other malignant neoplasm of large intestine: Secondary | ICD-10-CM | POA: Insufficient documentation

## 2015-10-16 DIAGNOSIS — H109 Unspecified conjunctivitis: Secondary | ICD-10-CM | POA: Insufficient documentation

## 2015-10-16 DIAGNOSIS — Z87891 Personal history of nicotine dependence: Secondary | ICD-10-CM | POA: Diagnosis not present

## 2015-10-16 DIAGNOSIS — Z79899 Other long term (current) drug therapy: Secondary | ICD-10-CM | POA: Insufficient documentation

## 2015-10-16 DIAGNOSIS — I1310 Hypertensive heart and chronic kidney disease without heart failure, with stage 1 through stage 4 chronic kidney disease, or unspecified chronic kidney disease: Secondary | ICD-10-CM | POA: Insufficient documentation

## 2015-10-16 MED ORDER — FLUORESCEIN SODIUM 1 MG OP STRP
1.0000 | ORAL_STRIP | Freq: Once | OPHTHALMIC | Status: AC
  Administered 2015-10-16: 1 via OPHTHALMIC
  Filled 2015-10-16: qty 1

## 2015-10-16 MED ORDER — TETRACAINE HCL 0.5 % OP SOLN
1.0000 [drp] | Freq: Once | OPHTHALMIC | Status: AC
  Administered 2015-10-16: 2 [drp] via OPHTHALMIC
  Filled 2015-10-16: qty 4

## 2015-10-16 NOTE — ED Triage Notes (Signed)
Pt c/o L eye redness. Also complaining of mild pain an itching in the affected eye. Family tried allergy medication at home without relief. Alert and ambulatory,.

## 2015-10-16 NOTE — ED Provider Notes (Signed)
[facility] DEPT Provider Note   CSN: [id number] Arrival date & time: 10/16/15  2202  First Provider Contact:  None     History   Chief Complaint Chief Complaint  Patient presents with  . Eye Pain    L    HPI Beth Wilkins is a 75 y.o. female.  HPI   75 year old female presents today with left eye irritation. Patient has a history of dementia, information was obtained from her daughter who is at bedside. She reports this morning she had no redness or pain in her eye, she came home this afternoon he noticed redness to the eye, patient itching the eye, and a small amount of discharge. Patient reported that she had "grit" in her eye. She denies any acute changes in her vision, fever, or any other significant complaints. No pain with ocular movements. No contacts.    Past Medical History:  Diagnosis Date  . Arthritis   . Bilateral renal cysts   . Centrilobular emphysema ([place])   . Chronic low back pain   . CKD (chronic kidney disease), stage III   . 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 Complication of anesthesia    can be combative due to dementia  . Dementia    MODERATE  . Full dentures   . History of gastroesophageal reflux (GERD)    changes diet  . History of pelvic fracture   . Hydronephrosis, right   . Hyperlipidemia   . Hypertension     Patient Active Problem List   Diagnosis Date Noted  . Dementia 05/12/2015  . Anemia of chronic disease 03/31/2015  . Cancer of right colon ([place]) 02/05/2015  . Absolute anemia   . Perforated appendix s/p right colectomy 01/24/2015 01/25/2015  . Hypokalemia 01/25/2015  . Leukocytosis 01/25/2015  . SBO (small bowel obstruction) ([place]) 01/21/2015  . ARF (acute renal failure)  ([place]) 01/21/2015  . Moderate dementia without behavioral disturbance 09/02/2014  . Memory loss 08/16/2013  . GERD (gastroesophageal reflux disease) 02/15/2013  . Pelvis fracture ([place]) 01/31/2013  . HTN (hypertension) 01/31/2013  . HLD (hyperlipidemia) 01/31/2013    Past Surgical History:  Procedure Laterality Date  . CATARACT EXTRACTION W/ INTRAOCULAR LENS  IMPLANT, BILATERAL  2005 approx  . CYSTOSCOPY WITH RETROGRADE PYELOGRAM, URETEROSCOPY AND STENT PLACEMENT Right 09/21/2015   Procedure: CYSTOSCOPY WITH BIL RETROGRADE PYELOGRAM, URETEROSCOPY, RIGHT STENT PLACEMENT ;  Surgeon: Cleon Gustin, MD;  Location: [facility];  Service: Urology;  Laterality: Right;  . LAPAROSCOPY N/A 01/23/2015   Procedure: DIAGNOSTIC LAPAROSCOPY, EXPLORATORY LAPAROTOMY  RIGHT HEMI-COLECTOMY FOR OBSTRUCTION OF RIGHT TERMINAL ILEUM;  Surgeon: [name], MD;  Location: [facility];  Service: General;  Laterality: N/A;    OB History    No data available       Home Medications    Prior to Admission medications   Medication Sig Start Date End Date Taking? Authorizing Provider  amLODipine (NORVASC) 5 MG tablet Take 5 mg by mouth every evening.     Historical Provider, MD  antiseptic oral rinse (BIOTENE) LIQD 15 mLs by Mouth Rinse route daily. [place]    Historical Provider, MD  Cholecalciferol (VITAMIN D3) 2000 UNITS TABS Take by mouth daily.    Historical Provider, MD  Coenzyme Q10 (CO Q 10) 100 MG CAPS Take by mouth daily.    Historical Provider, MD  donepezil (ARICEPT) 10 MG tablet Take 1 tab daily Patient taking differently: Take 10 mg by mouth every evening. Take 1 tab daily 09/02/14   Cameron Sprang, MD  MULTIPLE VITAMIN PO Take by mouth daily. Take Geritol - MVI with Iron.  Take 1 Tbsp daily.    Historical Provider, MD  polyethylene glycol (MIRALAX / GLYCOLAX) packet Take 17 g by mouth daily. Patient taking differently: Take 17 g by mouth daily as needed.  01/30/15   Verlee Monte, MD  simvastatin (ZOCOR) 20 MG tablet Take 20 mg by mouth daily at 6 PM.    Historical Provider, MD  traMADol (ULTRAM) 50 MG tablet Take 1 tablet (50 mg total) by mouth every 6 (six) hours as needed. 09/21/15   Cleon Gustin, MD    Family History Family History  Problem Relation Age of Onset  . Diabetes Mellitus II Father   . Cervical cancer Other   . Cancer Mother 67    ovarian cancer     Social History Social History  Substance Use Topics  . Smoking status: Former Smoker    Packs/day: 0.25    Years: 50.00    Types: Cigarettes    Quit date: 01/25/2015  . Smokeless tobacco: Never Used  . Alcohol use 1.8 oz/week    2 Glasses of wine, 1 Cans of beer per week     Comment: occasional      Allergies   Namenda [memantine hcl] and Oxycodone   Review of Systems Review of Systems  All other systems reviewed and are negative.   Physical Exam Updated Vital Signs BP 150/83 (BP Location: Right Arm)   Pulse 70   Temp 97.9 F (36.6 C) (Oral)   Resp 18   Ht 5\' 6"  (1.676 m)   Wt 54.4 kg   SpO2 100%   BMI 19.37 kg/m   Physical Exam  Constitutional: She is oriented to person, place, and time. She appears well-developed and well-nourished. No distress.  HENT:  Head: Normocephalic and atraumatic.  No peri-ocular swelling, redness, tenderness of warmth to touch  Eyes: Conjunctivae are normal. Pupils are equal, round, and reactive to light. Right eye exhibits no discharge. Left eye exhibits no discharge. No scleral icterus.  Crusting and discharge notes left eye, very minor stranding edema, no significant signs of cellulitis. Pupils equal round reactive to light, bulbar and palpebral ejection noted, her pain with ocular movements, no surrounding soft tissue tenderness.   No uptake on fluorescein exam   Neck: Normal range of motion. Neck supple. No JVD present. No tracheal deviation present. No thyromegaly present.  Pulmonary/Chest: Effort normal. No stridor.    Lymphadenopathy:    She has no cervical adenopathy.  Neurological: She is alert and oriented to person, place, and time. Coordination normal.  Skin: Skin is warm and dry. She is not diaphoretic.  Psychiatric: She has a normal mood and affect. Her behavior is normal. Judgment and thought content normal.  Nursing note and vitals reviewed.   ED Treatments / Results  Labs (all labs ordered are listed, but only abnormal results are displayed) Labs Reviewed - No data to display  EKG  EKG Interpretation None       Radiology No results found.  Procedures Procedures (including critical care time)  Medications Ordered in ED Medications  fluorescein ophthalmic strip 1 strip (1 strip Left Eye Given 10/16/15  2301)  tetracaine (PONTOCAINE) 0.5 % ophthalmic solution 1-2 drop (2 drops Left Eye Given 10/16/15 2301)  erythromycin ophthalmic ointment (1 application Left Eye Given 10/17/15 0133)     Initial Impression / Assessment and Plan / ED Course  I have reviewed the triage vital signs and the nursing notes.  Pertinent labs & imaging results that were available during my care of the patient were reviewed by me and considered in my medical decision making (see chart for details).  Clinical Course     Final Clinical Impressions(s) / ED Diagnoses   Final diagnoses:  Conjunctivitis of left eye    Labs:  Imaging:  Consults:  Therapeutics:  Discharge Meds:   Assessment/Plan:   75 year old female presents today with likely bacterial conjunctivitis. She has no signs of preseptal or septal cellulitis, no uptake on fluorescein exam. She'll be given erythromycin, discharged home with ophthalmology follow-up. Patient is Dr. Gershon Crane. Daughter reports she was scheduled follow up evaluation in 3 days. Strict return percussion skin, she verbalized understanding and agreement today's plan.   New Prescriptions Discharge Medication List as of 10/17/2015 12:16 AM       Okey Regal,  PA-C 10/17/15 0146    Tanna Furry, MD 10/20/15 1140

## 2015-10-17 MED ORDER — ERYTHROMYCIN 5 MG/GM OP OINT
TOPICAL_OINTMENT | Freq: Once | OPHTHALMIC | Status: AC
  Administered 2015-10-17: 1 via OPHTHALMIC
  Filled 2015-10-17: qty 3.5

## 2015-10-17 NOTE — Discharge Instructions (Signed)
Please use antibiotics as directed for times daily for 5 days. If symptoms persist please follow-up with Dr. Gershon Crane in 2 days for reevaluation. If symptoms worsen his follow-up sooner, or return to the emergency room.

## 2015-10-19 ENCOUNTER — Ambulatory Visit: Payer: Medicare Other | Attending: Gynecology | Admitting: Gynecology

## 2015-10-19 VITALS — BP 161/76 | HR 73 | Temp 98.6°F | Resp 17 | Wt 126.5 lb

## 2015-10-19 DIAGNOSIS — N83201 Unspecified ovarian cyst, right side: Secondary | ICD-10-CM | POA: Diagnosis not present

## 2015-10-19 DIAGNOSIS — Z87891 Personal history of nicotine dependence: Secondary | ICD-10-CM | POA: Diagnosis not present

## 2015-10-19 DIAGNOSIS — Z888 Allergy status to other drugs, medicaments and biological substances status: Secondary | ICD-10-CM | POA: Diagnosis not present

## 2015-10-19 DIAGNOSIS — Z9889 Other specified postprocedural states: Secondary | ICD-10-CM | POA: Diagnosis not present

## 2015-10-19 DIAGNOSIS — N183 Chronic kidney disease, stage 3 (moderate): Secondary | ICD-10-CM | POA: Insufficient documentation

## 2015-10-19 DIAGNOSIS — M545 Low back pain: Secondary | ICD-10-CM | POA: Insufficient documentation

## 2015-10-19 DIAGNOSIS — F0390 Unspecified dementia without behavioral disturbance: Secondary | ICD-10-CM | POA: Insufficient documentation

## 2015-10-19 DIAGNOSIS — E785 Hyperlipidemia, unspecified: Secondary | ICD-10-CM | POA: Diagnosis not present

## 2015-10-19 DIAGNOSIS — M1990 Unspecified osteoarthritis, unspecified site: Secondary | ICD-10-CM | POA: Diagnosis not present

## 2015-10-19 DIAGNOSIS — R970 Elevated carcinoembryonic antigen [CEA]: Secondary | ICD-10-CM

## 2015-10-19 DIAGNOSIS — Z79899 Other long term (current) drug therapy: Secondary | ICD-10-CM | POA: Insufficient documentation

## 2015-10-19 DIAGNOSIS — Z85038 Personal history of other malignant neoplasm of large intestine: Secondary | ICD-10-CM | POA: Insufficient documentation

## 2015-10-19 DIAGNOSIS — K219 Gastro-esophageal reflux disease without esophagitis: Secondary | ICD-10-CM | POA: Insufficient documentation

## 2015-10-19 DIAGNOSIS — G8929 Other chronic pain: Secondary | ICD-10-CM | POA: Diagnosis not present

## 2015-10-19 DIAGNOSIS — I129 Hypertensive chronic kidney disease with stage 1 through stage 4 chronic kidney disease, or unspecified chronic kidney disease: Secondary | ICD-10-CM | POA: Diagnosis not present

## 2015-10-19 NOTE — Progress Notes (Signed)
Consult Note: Gyn-Onc   Beth Wilkins 75 y.o. female  No chief complaint on file.   Assessment : Right adnexal complex cyst documented on ultrasound (June 2017) and CT scan (May 2017). Slowly rising CEA. Normal CA-125 value  Plan: Since it has been over 2 months since the pelvic ultrasound, I recommended the ultrasound be repeated to assess weathers been any change in this ovarian cyst. In addition, we will be reasonable to obtain a PET scan to evaluate whether the patient is having recurrent colon cancer (given the rising CEA) and whether there is increased activity in this ovary.  Differential diagnosis was discussed with the patient and her daughter. I indicated that based on results of the above noted testing it may be reasonable to follow this cyst or on the other hand if it is just of a malignancy proceed with surgical resection.    HPI: 75 year old left American female seen in consultation at the request of Dr. Burr Medico regarding management of a right adnexal mass. The mass discovered in the course of imaging for [facility] colon cancer. On ultrasound the mass described as measuring 4.4 x 3.7 x 4.6 cm which is complex cystic and solid with thick septations. CA-125 is 6 units per mL although CEA is 14 (previously 10 and 6)  Patient was found to have a right colon cancer in November 2016 undergoing surgical resection of the cecum and terminal ileum. There is invasive adenocarcinoma extending into the pericecal connective tissue and the patient had an obstructed right ureter treated with stent which has recently been removed. She was treated adjuvantly with Zeloda completed in May 2017.  The patient comes in, he by her daughter. She denies any GI GU or systemic symptoms. Her functional status is good although she is demented.  Review of Systems:10 point review of systems is negative except as noted in interval history.   Vitals: Blood pressure (!) 161/76, pulse 73, temperature 98.6 F (37 C),  temperature source Oral, resp. rate 17, weight 126 lb 8 oz (57.4 kg), SpO2 100 %.  Physical Exam: General : The patient is a healthy woman in no acute distress.  HEENT: normocephalic, extraoccular movements normal; neck is supple without thyromegally  Lynphnodes: Supraclavicular and inguinal nodes not enlarged  Abdomen: Soft, non-tender, no ascites, no organomegally, no masses, no hernias  Pelvic:  EGBUS: Normal female  Vagina: Normal, no lesions  Urethra and Bladder: Normal, non-tender  Cervix: Normal Uterus: Anterior normal shape size and consistency. Bi-manual examination: Non-tender; no adenxal masses or nodularity  Rectal: normal sphincter tone, no masses, no blood  Lower extremities: No edema or varicosities. Normal range of motion      Allergies  Allergen Reactions  . Namenda [Memantine Hcl] Other (See Comments)    hallucinations  . Oxycodone Other (See Comments)    Hallucinations     Past Medical History:  Diagnosis Date  . Arthritis   . Bilateral renal cysts   . Centrilobular emphysema ([place])   . Chronic low back pain   . CKD (chronic kidney disease), stage III   . 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 Complication of anesthesia    can be combative due to dementia  . Dementia    MODERATE  .  Full dentures   . History of gastroesophageal reflux (GERD)    changes diet  . History of pelvic fracture   . Hydronephrosis, right   . Hyperlipidemia   . Hypertension     Past Surgical History:  Procedure Laterality Date  . CATARACT EXTRACTION W/ INTRAOCULAR LENS  IMPLANT, BILATERAL  2005 approx  . CYSTOSCOPY WITH RETROGRADE PYELOGRAM, URETEROSCOPY AND STENT PLACEMENT Right 09/21/2015   Procedure: CYSTOSCOPY WITH BIL  RETROGRADE PYELOGRAM, URETEROSCOPY, RIGHT STENT PLACEMENT ;  Surgeon: Cleon Gustin, MD;  Location: [facility];  Service: Urology;  Laterality: Right;  . LAPAROSCOPY N/A 01/23/2015   Procedure: DIAGNOSTIC LAPAROSCOPY, EXPLORATORY LAPAROTOMY  RIGHT HEMI-COLECTOMY FOR OBSTRUCTION OF RIGHT TERMINAL ILEUM;  Surgeon: [name], MD;  Location: [facility];  Service: General;  Laterality: N/A;    Current Outpatient Prescriptions  Medication Sig Dispense Refill  . erythromycin ophthalmic ointment Place 1 application into the left eye 2 (two) times daily.    Marland Kitchen amLODipine (NORVASC) 5 MG tablet Take 5 mg by mouth every evening.     Marland Kitchen antiseptic oral rinse (BIOTENE) LIQD 15 mLs by Mouth Rinse route daily. BIOTENE CLINICAL    . Cholecalciferol (VITAMIN D3) 2000 UNITS TABS Take by mouth daily.    . Coenzyme Q10 (CO Q 10) 100 MG CAPS Take by mouth daily.    Marland Kitchen donepezil (ARICEPT) 10 MG tablet Take 1 tab daily (Patient taking differently: Take 10 mg by mouth every evening. Take 1 tab daily) 30 tablet 11  . MULTIPLE VITAMIN PO Take by mouth daily. Take Geritol - MVI with Iron.  Take 1 Tbsp daily.    . polyethylene glycol (MIRALAX / GLYCOLAX) packet Take 17 g by mouth daily. (Patient taking differently: Take 17 g by mouth daily as needed. ) 14 each 0  . simvastatin (ZOCOR) 20 MG tablet Take 20 mg by mouth daily at 6 PM.    . traMADol (ULTRAM) 50 MG tablet Take 1 tablet (50 mg total) by mouth every 6 (six) hours as needed. 30 tablet 0   No current facility-administered medications for this visit.     Social History   Social History  . Marital status: Single    Spouse name: N/A  . Number of children: N/A  . Years of education: N/A   Occupational History  . Not on file.   Social History Main Topics  . Smoking status: Former Smoker    Packs/day: 0.25    Years: 50.00    Types: Cigarettes    Quit date: 01/25/2015  . Smokeless tobacco: Never Used  . Alcohol use 1.8 oz/week    2  Glasses of wine, 1 Cans of beer per week     Comment: occasional   . Drug use: No  . Sexual activity: Not on file   Other Topics Concern  . Not on file   Social History Narrative   Legally separated   Lives w/daughter, Francessca January   Has total of #2 daughters and #1 son   Dementia   Fall Risk-ambulates w/cane    Family History  Problem Relation Age of Onset  . Diabetes Mellitus II Father   . Cervical cancer Other   . Cancer Mother 17    ovarian cancer       Marti Sleigh, MD 10/19/2015, 1:17 PM

## 2015-10-19 NOTE — Patient Instructions (Signed)
We will arrange for you to have an ultrasound and PET scan and call you with the appointments. We will then see you back in our office to discuss results. Please call any time if you have new questions or concerns.

## 2015-10-22 ENCOUNTER — Telehealth: Payer: Self-pay | Admitting: *Deleted

## 2015-10-22 NOTE — Telephone Encounter (Signed)
Left message on daughter's voicemail to please call Gyn Oncology at [phone] for appt updates

## 2015-10-22 NOTE — Telephone Encounter (Signed)
Outgoing call was returned by patient's daughter. Appt information was given. Pt's daughter agreed with appt, time, date and voiced understanding of instructions

## 2015-10-26 ENCOUNTER — Other Ambulatory Visit: Payer: Self-pay | Admitting: Pharmacist

## 2015-10-27 ENCOUNTER — Ambulatory Visit (HOSPITAL_COMMUNITY)
Admission: RE | Admit: 2015-10-27 | Discharge: 2015-10-27 | Disposition: A | Discharge status: Home / Self Care | Payer: Medicare Other | Source: Ambulatory Visit | Attending: Gynecologic Oncology | Admitting: Gynecologic Oncology

## 2015-10-27 ENCOUNTER — Telehealth: Payer: Self-pay | Admitting: Gynecologic Oncology

## 2015-10-27 DIAGNOSIS — N838 Other noninflammatory disorders of ovary, fallopian tube and broad ligament: Secondary | ICD-10-CM | POA: Diagnosis not present

## 2015-10-27 DIAGNOSIS — N83201 Unspecified ovarian cyst, right side: Secondary | ICD-10-CM | POA: Insufficient documentation

## 2015-10-27 NOTE — Telephone Encounter (Signed)
Patient's daughter notified of ultrasound results.  PET scan scheduled for tomorrow.  Advised we would contact her with the results of the PET when available.  All questions answered.  Advised to call for any needs or concerns.

## 2015-10-28 ENCOUNTER — Encounter (HOSPITAL_COMMUNITY)
Admission: RE | Admit: 2015-10-28 | Discharge: 2015-10-28 | Disposition: A | Discharge status: Home / Self Care | Payer: Medicare Other | Source: Ambulatory Visit | Attending: Gynecologic Oncology | Admitting: Gynecologic Oncology

## 2015-10-28 ENCOUNTER — Telehealth: Payer: Self-pay | Admitting: Gynecologic Oncology

## 2015-10-28 DIAGNOSIS — N83201 Unspecified ovarian cyst, right side: Secondary | ICD-10-CM | POA: Insufficient documentation

## 2015-10-28 LAB — GLUCOSE, CAPILLARY: GLUCOSE-CAPILLARY: 93 mg/dL (ref 65–99)

## 2015-10-28 MED ORDER — FLUDEOXYGLUCOSE F - 18 (FDG) INJECTION
5.2500 | Freq: Once | INTRAVENOUS | Status: AC | PRN
  Administered 2015-10-28: 5.25 via INTRAVENOUS

## 2015-10-28 NOTE — Telephone Encounter (Signed)
Informed Beth Wilkins about PET scan results.  Answered all questions.  Asking "what is next?"  Advised her that the results had been forwarded to Dr. Burr Medico and she should be hearing from her office with the next steps.  Advised to call for any needs or concerns.

## 2015-10-29 ENCOUNTER — Telehealth: Payer: Self-pay | Admitting: Hematology

## 2015-10-29 NOTE — Telephone Encounter (Signed)
APPT CONF WITH DTR/VANESSA. PER 11:30 ON 8/30, PER 08/24 [facility].

## 2015-11-04 ENCOUNTER — Telehealth: Payer: Self-pay | Admitting: Hematology

## 2015-11-04 ENCOUNTER — Ambulatory Visit (HOSPITAL_BASED_OUTPATIENT_CLINIC_OR_DEPARTMENT_OTHER): Payer: Medicare Other | Admitting: Hematology

## 2015-11-04 ENCOUNTER — Ambulatory Visit (HOSPITAL_BASED_OUTPATIENT_CLINIC_OR_DEPARTMENT_OTHER): Payer: Medicare Other

## 2015-11-04 VITALS — BP 143/83 | HR 78 | Temp 98.7°F | Resp 18 | Ht 66.0 in | Wt 127.9 lb

## 2015-11-04 DIAGNOSIS — F0390 Unspecified dementia without behavioral disturbance: Secondary | ICD-10-CM

## 2015-11-04 DIAGNOSIS — C180 Malignant neoplasm of cecum: Secondary | ICD-10-CM

## 2015-11-04 DIAGNOSIS — C182 Malignant neoplasm of ascending colon: Secondary | ICD-10-CM

## 2015-11-04 DIAGNOSIS — N183 Chronic kidney disease, stage 3 (moderate): Secondary | ICD-10-CM | POA: Diagnosis not present

## 2015-11-04 DIAGNOSIS — I10 Essential (primary) hypertension: Secondary | ICD-10-CM

## 2015-11-04 DIAGNOSIS — D638 Anemia in other chronic diseases classified elsewhere: Secondary | ICD-10-CM | POA: Diagnosis not present

## 2015-11-04 LAB — CBC WITH DIFFERENTIAL/PLATELET
BASO%: 0.9 % (ref 0.0–2.0)
Basophils Absolute: 0.1 10*3/uL (ref 0.0–0.1)
EOS%: 4.4 % (ref 0.0–7.0)
Eosinophils Absolute: 0.3 10*3/uL (ref 0.0–0.5)
HEMATOCRIT: 37.9 % (ref 34.8–46.6)
HGB: 12.4 g/dL (ref 11.6–15.9)
LYMPH%: 22.7 % (ref 14.0–49.7)
MCH: 28.6 pg (ref 25.1–34.0)
MCHC: 32.6 g/dL (ref 31.5–36.0)
MCV: 87.7 fL (ref 79.5–101.0)
MONO#: 0.4 10*3/uL (ref 0.1–0.9)
MONO%: 6.3 % (ref 0.0–14.0)
NEUT%: 65.7 % (ref 38.4–76.8)
NEUTROS ABS: 4.4 10*3/uL (ref 1.5–6.5)
PLATELETS: 300 10*3/uL (ref 145–400)
RBC: 4.33 10*6/uL (ref 3.70–5.45)
RDW: 15.2 % — AB (ref 11.2–14.5)
WBC: 6.7 10*3/uL (ref 3.9–10.3)
lymph#: 1.5 10*3/uL (ref 0.9–3.3)

## 2015-11-04 LAB — COMPREHENSIVE METABOLIC PANEL
ANION GAP: 10 meq/L (ref 3–11)
AST: 17 U/L (ref 5–34)
Albumin: 3.8 g/dL (ref 3.5–5.0)
Alkaline Phosphatase: 69 U/L (ref 40–150)
BILIRUBIN TOTAL: 0.99 mg/dL (ref 0.20–1.20)
BUN: 17.4 mg/dL (ref 7.0–26.0)
CALCIUM: 9.3 mg/dL (ref 8.4–10.4)
CHLORIDE: 107 meq/L (ref 98–109)
CO2: 25 meq/L (ref 22–29)
CREATININE: 1.4 mg/dL — AB (ref 0.6–1.1)
EGFR: 44 mL/min/{1.73_m2} — ABNORMAL LOW (ref >90)
Glucose: 87 mg/dl (ref 70–140)
Potassium: 4.1 mEq/L (ref 3.5–5.1)
Sodium: 142 mEq/L (ref 136–145)
TOTAL PROTEIN: 7.9 g/dL (ref 6.4–8.3)

## 2015-11-04 LAB — CEA (IN HOUSE-CHCC): CEA (CHCC-IN HOUSE): 12.83 ng/mL — AB (ref 0.00–5.00)

## 2015-11-04 NOTE — Progress Notes (Signed)
[place]  [phone] [phone]) [phone]  Clinic Follow Up Note   Patient Care Team: [name], MD as PCP - General (Family Medicine) [name], MD as Consulting Physician (Medical Oncology) [name], MD as Consulting Physician (General Surgery) [name], MD as Consulting Physician (Orthopedic Surgery) Cleon Gustin, MD as Consulting Physician (Urology) 11/04/2015     CHIEF COMPLAINTS:  Follow up stage II colon cancer  Oncology History   Cancer of right colon [facility])   Staging form: Colon and Rectum, AJCC 7th Edition     Pathologic stage from 01/23/2015: Stage IIC (T4b, N0, cM0) - Signed by [name], MD on 02/05/2015       Cancer of right colon ([place])   01/21/2015 Imaging     CT abdomen and pelvis showed high-grade small bowel obstruction,  right I [place] ureteral nephrosis,  multiple liver cysts.  CT chest was negative for metastatic lesion.      01/23/2015 Pathology Results     invasive adenocarcinoma extending into the pericecal connective tissue,  grade 2, LVI (-),  no perineural invasion, 3 lymph nodes were negative.      01/23/2015 Surgery      terminal ileum and cecum seegmental resection with anastomosis by Dr. Hassell Done      01/23/2015 Initial Diagnosis    Cancer of right colon ([place])      03/13/2015 - 07/06/2015 Chemotherapy    Xeloda 1500 mg bid X 14 days on-7 off, s/p 5 cycles, stopped early due to wrosening renal function      10/28/2015 Progression    PET scan showed multiple hypermetabolic peritoneal nodules, right ovarian cystic mass, and hypermetabolic liver lesion, highly suspicious for metastatic colon cancer. Small pulmonary nodules are indeterminate.        HISTORY OF PRESENTING ILLNESS (02/05/2015):  Beth Wilkins 75 y.o. female  with past medical history of moderate dementia, hypertension, is here because of recently diagnosed stage II colon cancer. She is accompanied to the clinic by her daughter.  History of manic is from the chart and her daughter.  She has had abdominal discomfort, nausea and vomiting, and norexia for  the past to 3 months along with 15 pounds weight loss, . Her nausea was mild and intermittent, she does not seek medical attention initially. She presented with  worsening symptoms for 2-3 weeks, and episodes of projectile vomiting and dehydration to emergency room, was admitted to [facility] on 01/20/2015. CT scan reviewed small bowel obstruction, and possible appendiceal rupture. She was brought to OR on 01/23/2015, underwent right hemicolectomy with anastomosis by Dr. Hassell Done. Surgical path reviewed adenocarcinoma at the cecum. She was discharged home on 01/26/2015.  She has recovered well, eating much better than prior to surgery, move around without restriction. No pain, she has loose BM 3 times a day, and she has backed off her mirealax.  she lives with her daughter. She states her appetite is moderate, sometime low, and she refuses to eat if she doesn't have appetite. No nausea, vomiting, abdominal bloating since her surgery.  Her last colonoscopy was 6-8 years ago, and her stool OB was negative this year.   CURRENT THERAPY: Observation  INTERIM HISTORY: Beth Wilkins returns for follow-up. She is accompanied by her daughter to clinic today. She feels well overall, denies any pain, abdominal discomfort or other symptoms. Her appetite and energy levels are decent, have not changed much. She was seen by GYN oncologist Dr. Abran Cantor.who ordered a PET scan which unfortunately  showed cancer recurrence.   MEDICAL HISTORY:  Past Medical History:  Diagnosis Date  . Arthritis   . Bilateral renal cysts   . Centrilobular emphysema ([place])   . Chronic low back pain   . CKD (chronic kidney disease), stage III   . Colon cancer [facility]) oncologist-  dr [name]   dx 01-23-2015  right colon Invasive adenocarcinoma into pericecal connective tissue and involving small intestine , Stage  IIc, Grade 2, LVI(-),  (pT4b N0 M0) /  s/p resection termial ileum and cecum and chemotherapy 01-06-217 to 07-06-2015  . Complication of anesthesia    can be combative due to dementia  . Dementia    MODERATE  . Full dentures   . History of gastroesophageal reflux (GERD)    changes diet  . History of pelvic fracture   . Hydronephrosis, right   . Hyperlipidemia   . Hypertension     SURGICAL HISTORY: Past Surgical History:  Procedure Laterality Date  . CATARACT EXTRACTION W/ INTRAOCULAR LENS  IMPLANT, BILATERAL  2005 approx  . CYSTOSCOPY WITH RETROGRADE PYELOGRAM, URETEROSCOPY AND STENT PLACEMENT Right 09/21/2015   Procedure: CYSTOSCOPY WITH BIL RETROGRADE PYELOGRAM, URETEROSCOPY, RIGHT STENT PLACEMENT ;  Surgeon: Cleon Gustin, MD;  Location: [facility];  Service: Urology;  Laterality: Right;  . LAPAROSCOPY N/A 01/23/2015   Procedure: DIAGNOSTIC LAPAROSCOPY, EXPLORATORY LAPAROTOMY  RIGHT HEMI-COLECTOMY FOR OBSTRUCTION OF RIGHT TERMINAL ILEUM;  Surgeon: [name], MD;  Location: [facility];  Service: General;  Laterality: N/A;    SOCIAL HISTORY: Social History   Social History  . Marital status: Single    Spouse name: N/A  . Number of children: N/A  . Years of education: N/A   Occupational History  . Not on file.   Social History Main Topics  . Smoking status: Former Smoker    Packs/day: 0.25    Years: 50.00    Types: Cigarettes    Quit date: 01/25/2015  . Smokeless tobacco: Never Used  . Alcohol use 1.8 oz/week    2 Glasses of wine, 1 Cans of beer per week     Comment: occasional   . Drug use: No  . Sexual activity: Not on file   Other Topics Concern  . Not on file   Social History Narrative   Legally separated   Lives w/daughter, Beth Wilkins   Has total of #2 daughters and #1 son   Dementia   Fall Risk-ambulates w/cane    FAMILY HISTORY: Family History  Problem Relation Age of Onset  . Diabetes Mellitus II Father   . Cervical  cancer Other   . Cancer Mother 23    ovarian cancer     ALLERGIES:  is allergic to namenda [memantine hcl] and oxycodone.  MEDICATIONS:  Current Outpatient Prescriptions  Medication Sig Dispense Refill  . amLODipine (NORVASC) 5 MG tablet Take 5 mg by mouth every evening.     Marland Kitchen antiseptic oral rinse (BIOTENE) LIQD 15 mLs by Mouth Rinse route daily. BIOTENE CLINICAL    . Cholecalciferol (VITAMIN D3) 2000 UNITS TABS Take by mouth daily.    . Coenzyme Q10 (CO Q 10) 100 MG CAPS Take by mouth daily.    Marland Kitchen donepezil (ARICEPT) 10 MG tablet Take 1 tab daily (Patient taking differently: Take 10 mg by mouth every evening. Take 1 tab daily) 30 tablet 11  . erythromycin ophthalmic ointment Place 1 application into the left eye 2 (two) times daily.    . MULTIPLE VITAMIN PO  Take by mouth daily. Take Geritol - MVI with Iron.  Take 1 Tbsp daily.    . polyethylene glycol (MIRALAX / GLYCOLAX) packet Take 17 g by mouth daily. (Patient taking differently: Take 17 g by mouth daily as needed. ) 14 each 0  . simvastatin (ZOCOR) 20 MG tablet Take 20 mg by mouth daily at 6 PM.    . traMADol (ULTRAM) 50 MG tablet Take 1 tablet (50 mg total) by mouth every 6 (six) hours as needed. 30 tablet 0   No current facility-administered medications for this visit.     REVIEW OF SYSTEMS:   Constitutional: Denies fevers, chills or abnormal night sweats Eyes: Denies blurriness of vision, double vision or watery eyes Ears, nose, mouth, throat, and face: Denies mucositis or sore throat Respiratory: Denies cough, dyspnea or wheezes Cardiovascular: Denies palpitation, chest discomfort or lower extremity swelling Gastrointestinal:  Denies nausea, heartburn or change in bowel habits Skin: Denies abnormal skin rashes Lymphatics: Denies new lymphadenopathy or easy bruising Neurological:Denies numbness, tingling or new weaknesses Behavioral/Psych: Mood is stable, no new changes  All other systems were reviewed with the patient and  are negative.  PHYSICAL EXAMINATION: ECOG PERFORMANCE STATUS: 1 - Symptomatic but completely ambulatory  Vitals:   11/04/15 1139  BP: (!) 143/83  Pulse: 78  Resp: 18  Temp: 98.7 F (37.1 C)   Filed Weights   11/04/15 1139  Weight: 127 lb 14.4 oz (58 kg)    GENERAL:alert, no distress and comfortable SKIN: skin color, texture, turgor are normal, no rashes or significant lesions EYES: normal, conjunctiva are pink and non-injected, sclera clear OROPHARYNX:no exudate, no erythema and lips, buccal mucosa, and tongue normal  NECK: supple, thyroid normal size, non-tender, without nodularity LYMPH:  no palpable lymphadenopathy in the cervical, axillary or inguinal LUNGS: clear to auscultation and percussion with normal breathing effort HEART: regular rate & rhythm and no murmurs and no lower extremity edema ABDOMEN:abdomen soft, non-tender and normal bowel sounds Musculoskeletal:no cyanosis of digits and no clubbing  PSYCH: alert & oriented x 3 with fluent speech NEURO: no focal motor/sensory deficits  LABORATORY DATA:  I have reviewed the data as listed CBC Latest Ref Rng & Units 11/04/2015 10/12/2015 09/21/2015  WBC 3.9 - 10.3 10e3/uL 6.7 6.6 -  Hemoglobin 11.6 - 15.9 g/dL 12.4 12.9 13.6  Hematocrit 34.8 - 46.6 % 37.9 38.4 40.0  Platelets 145 - 400 10e3/uL 300 283 -    CMP Latest Ref Rng & Units 11/04/2015 10/12/2015 09/21/2015  Glucose 70 - 140 mg/dl 87 138 89  BUN 7.0 - 26.0 mg/dL 17.4 20.1 21(H)  Creatinine 0.6 - 1.1 mg/dL 1.4(H) 1.3(H) 1.40(H)  Sodium 136 - 145 mEq/L 142 143 144  Potassium 3.5 - 5.1 mEq/L 4.1 3.6 4.5  Chloride 101 - 111 mmol/L - - 105  CO2 22 - 29 mEq/L 25 24 -  Calcium 8.4 - 10.4 mg/dL 9.3 10.5(H) -  Total Protein 6.4 - 8.3 g/dL 7.9 7.9 -  Total Bilirubin 0.20 - 1.20 mg/dL 0.99 0.84 -  Alkaline Phos 40 - 150 U/L 69 69 -  AST 5 - 34 U/L 17 16 -  ALT 0 - 55 U/L <9 <9 -   CEA  Order: [id number]  Status:  Final result Visible to patient:  No (Not Released)  Next appt:  12/28/2015 at 02:00 PM in Oncology [facility] 4)    Ref Range & Units 1d ago 61moago   CEA 0.0 - 4.7 ng/mL 14.2  10.3CM  PATHOLOGY REPORT: Diagnosis 01/23/2015 Colon, segmental resection, with terminal ileum and cecum - INVASIVE ADENOCARCINOMA EXTENDING INTO PERICECAL CONNECTIVE TISSUE AND INVOLVING SMALL INTESTINE. - THREE BENIGN LYMPH NODES (0/3). - SEE ONCOLOGY TABLE BELOW.  Microscopic Comment COLON AND RECTUM (INCLUDING TRANS-ANAL RESECTION): Specimen: Terminal ileum and cecum. Procedure: Resection. Tumor site: Cecum adjacent to appendix. Specimen integrity: Intact. Macroscopic intactness of mesorectum: Not applicable. Macroscopic tumor perforation: No. Invasive tumor: Maximum size: 4.0 cm. Histologic type(s): Colorectal adenocarcinoma. Histologic grade and differentiation: G2: moderately differentiated/low grade. Type of polyp in which invasive carcinoma arose: No residual polyp. Microscopic extension of invasive tumor: Into pericecal connective tissue and terminal ileum. Lymph-Vascular invasion: Not identified. Peri-neural invasion: Not identified. Tumor deposit(s) (discontinuous extramural extension): No. Resection margins: Proximal margin: Free of tumor. Distal margin: Free of tumor. Circumferential (radial) (posterior ascending, posterior descending; lateral and posterior mid-rectum; and entire lower 1/3 rectum): Free of tumor. Mesenteric margin (sigmoid and transverse): N/A. Distance closest margin (if all above margins negative): N/A. Treatment effect (neo-adjuvant therapy): No. Additional polyp(s): No. Non-neoplastic findings: N/A. Lymph nodes: number examined 3; number positive: 0. Pathologic Staging: pT4b, pN0, pMX. Ancillary studies: Mismatch repair protein by immunohistochemistry pending. Comments: There is a colorectal-type adenocarcinoma arising in the cecum in the area of the appendiceal orifice. The tumor extends into the  pericecal connective tissue, and involves the attached segment of terminal ileum. Immunohistochemistry shows the tumor is positive with CDX2 and cytokeratin 20, and negative with cytokeratin 7, estrogen receptor and WT-1 supporting a diagnosis of primary colorectal adenocarcinoma. ADDITIONAL INFORMATION: Mismatch Repair (MMR) Protein Immunohistochemistry (IHC) IHC Expression Result: MLH1: Preserved nuclear expression (greater 50% tumor expression) MSH2: Preserved nuclear expression (greater 50% tumor expression) MSH6: Preserved nuclear expression (greater 50% tumor expression) PMS2: Preserved nuclear expression (greater 50% tumor expression) * Internal control demonstrates intact nuclear expression Interpretation: NORMAL There is preserved expression of the major and minor MMR proteins. There is a very low probability that microsatellite instability (MSI) is present. However, certain clinically significant MMR protein mutations may result in preservation of nuclear expression. It is recommended that the preservation of protein expression be correlated with molecular based MSI testing.  RADIOGRAPHIC STUDIES: I have personally reviewed the radiological images as listed and agreed with the findings in the report.  Ct chest, Abdomen Pelvis Wo Contrast 08/04/2015 IMPRESSION: 1. Irregular mass in cyst-like appearance along the right adnexa with termination of considerable right hydroureter in this vicinity. The rectosigmoid junction might also be tethered in this vicinity although this is uncertain. Pelvic sonography recommended for further characterization of the right ovary. The obstruction of the ureter is causing prominent right hydronephrosis. 2. Bilateral renal cysts and hepatic cysts. These appear stable. 3. Centrilobular emphysema in the lungs. The ground-glass nodule posteriorly in the right upper lobe has nearly resolved, and was probably inflammatory. 4. There new bands of  sclerosis anteriorly in the left second and third ribs, and also new deformity in the upper sternal body, suspicious for healing fractures. Has the patient sustained recent trauma? 5. Flaccid and unusual appearance the urinary bladder, but stable. 6. Aortoiliac atherosclerotic vascular disease. 7. Postoperative findings in the vicinity of the proximal right colon. Borderline prominent lymph node in the sigmoid mesocolon. 8. Healed right pelvic fractures.  US pelvis 08/14/2015 IMPRESSION: Asymmetric enlargement of the right ovary which contains a cystic and solid lesion. This scratch set the size of the left ovary is considered abnormal for a postmenopausal female. Cannot rule out underlying benign or malignant cystic ovarian neoplasm. Consider  further investigation with either surgical resection or contrast enhanced MRI.  PET 10/28/2015 IMPRESSION: 1. 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 2. Hypermetabolic thickening adjacent to the cystic RIGHT ovary. Favor peritoneal carcinomatosis over primary RIGHT ovarian carcinoma 3. New hypermetabolic HEPATIC METASTASIS in the RIGHT hepatic lobe. 4. Moderate metabolic activity at the enteric colonic anastomosis in the RIGHT abdomen. 5. Small pulmonary nodules are indeterminate. Recommend attention on follow-up. 6. Overall impression favors recurrence of colorectal carcinoma with peritoneal carcinomatosis over ovarian carcinoma with Carcinomatosis.  ASSESSMENT & PLAN: 75 year old African-American female, with past medical history of moderate dementia, independent with ADLs, hypertension, presents with small bowel obstruction and weight loss.  1. Right colon adenocarcinoma from cecum, pT4bN0M0, stage IIc, MMR normal, recurrent metastatic disease in 10/2015 - she is a high risk stage II colon cancer, I have recommended adjuvant chemotherapy Xeloda  -she completed a total of 5 cycles (out of 8 cycles)  Xeloda, stopped early due to her worsening renal function. -I personally reviewed her recent PET scan images, unfortunately the scan showed hypermetabolic peritoneal and liver metastases. Given her elevated CEA, normal CA125, this is most consistent with recurrent colon cancer -I discussed tissue biopsy to confirm recurrence. Due to her advanced age and dementia, we decided not to pursue biopsy. -She is not a candidate for intensive chemo. I recommend to have Foundation One genomic testing to see if she is a candidate for targeted therapy or immunotherappy.  -I recommend her to start Xeloda '1500mg'$  bid, 2 weeks on and one week off, she agrees.   2. Anemia -Resolved after she came off chemotherapy  3. CKD stage III, right hydroureter -Her Cr was around 1 in 08/2013, Cr increased to 2.4 on 01/20/2015 when she was diagnosed with colon cancer due to dehydration, improved some and has been around 1.2-1.6 lately.  -Her recent CT scan showed right hydroureter, which probably contributes to her renal insufficiency. -she was seen by urologist and had temperate stent which was removed   4. HTN, dementia -She will continue her medication and follow-up with her primary care physician.   Plan -I will send her tumor tissue for Foundation One test. -she will start Xeloda '1500mg'$  bid, 2 weeks on and one week off  -I will see her back in 3 weeks  All questions were answered. The patient knows to call the clinic with any problems, questions or concerns.  I spent 20 minutes counseling the patient face to face. The total time spent in the appointment was 25 minutes and more than 50% was on counseling.     [name], MD 11/04/2015

## 2015-11-04 NOTE — Telephone Encounter (Signed)
AVS REPORT AND SCHD GIVEN PER 11/04/15 LOS °

## 2015-11-05 LAB — CEA: CEA: 14 ng/mL — ABNORMAL HIGH (ref 0.0–4.7)

## 2015-11-06 ENCOUNTER — Other Ambulatory Visit (HOSPITAL_COMMUNITY)
Admission: RE | Admit: 2015-11-06 | Discharge: 2015-11-06 | Disposition: A | Discharge status: Home / Self Care | Payer: Medicare Other | Source: Ambulatory Visit | Attending: Hematology | Admitting: Hematology

## 2015-11-06 DIAGNOSIS — C182 Malignant neoplasm of ascending colon: Secondary | ICD-10-CM | POA: Diagnosis present

## 2015-11-07 ENCOUNTER — Encounter: Payer: Self-pay | Admitting: Hematology

## 2015-11-10 ENCOUNTER — Telehealth: Payer: Self-pay | Admitting: *Deleted

## 2015-11-10 ENCOUNTER — Other Ambulatory Visit: Payer: Self-pay | Admitting: Hematology

## 2015-11-10 MED ORDER — CAPECITABINE 500 MG PO TABS
1000.0000 mg/m2 | ORAL_TABLET | Freq: Two times a day (BID) | ORAL | 0 refills | Status: DC
Start: 2015-11-10 — End: 2015-12-02

## 2015-11-10 MED FILL — CAPECITABINE 500 MG TABLET: 500 | 13 days supply | Qty: 77 | Fill #0

## 2015-11-10 NOTE — Telephone Encounter (Signed)
Called pt's daughter, informed her Rx has been sent to [facility]. She understands to expect a call from pharmacy.

## 2015-11-10 NOTE — Telephone Encounter (Signed)
I called in Xeloda to [facility] today, please let her know. Thanks.   [name] MD

## 2015-11-10 NOTE — Telephone Encounter (Signed)
Call from pt's daughter requesting to speak to MD about the plan for chemo. Beth Wilkins thinks they would be able to manage a pump at home if Dr. Burr Medico recommends this.  Informed her according to Dr. Ernestina Penna notes the plan is to resume Xeloda 1,500 mg BID for two weeks on and one week off. Beth Wilkins confirms this is the dose she had taken previously. They picked up Rx from [facility] in the past. Message routed to collaborative RN for prescription to be sent to [facility].

## 2015-11-10 NOTE — Progress Notes (Deleted)
[place]

## 2015-11-20 ENCOUNTER — Encounter (HOSPITAL_COMMUNITY): Payer: Self-pay

## 2015-11-24 ENCOUNTER — Other Ambulatory Visit: Payer: Self-pay | Admitting: Urology

## 2015-12-01 ENCOUNTER — Encounter (HOSPITAL_BASED_OUTPATIENT_CLINIC_OR_DEPARTMENT_OTHER): Payer: Self-pay | Admitting: *Deleted

## 2015-12-01 NOTE — Progress Notes (Signed)
SPOKE W/  DAUGHTER,  VANESSA (WHOM IS HCPOA).  NPO AFTER MN.  CURRENT LAB RESULTS AND EKG IN CHART AND EPIC.  PT HAS DEMENTIA.

## 2015-12-02 ENCOUNTER — Ambulatory Visit (HOSPITAL_BASED_OUTPATIENT_CLINIC_OR_DEPARTMENT_OTHER): Payer: Medicare Other

## 2015-12-02 ENCOUNTER — Encounter: Payer: Self-pay | Admitting: Hematology

## 2015-12-02 ENCOUNTER — Other Ambulatory Visit (HOSPITAL_BASED_OUTPATIENT_CLINIC_OR_DEPARTMENT_OTHER): Payer: Medicare Other

## 2015-12-02 ENCOUNTER — Ambulatory Visit (HOSPITAL_BASED_OUTPATIENT_CLINIC_OR_DEPARTMENT_OTHER): Payer: Medicare Other | Admitting: Hematology

## 2015-12-02 ENCOUNTER — Telehealth: Payer: Self-pay | Admitting: Hematology

## 2015-12-02 VITALS — BP 160/78 | HR 79 | Temp 97.7°F | Resp 18 | Ht 66.0 in | Wt 128.0 lb

## 2015-12-02 DIAGNOSIS — C180 Malignant neoplasm of cecum: Secondary | ICD-10-CM

## 2015-12-02 DIAGNOSIS — N183 Chronic kidney disease, stage 3 (moderate): Secondary | ICD-10-CM | POA: Diagnosis not present

## 2015-12-02 DIAGNOSIS — Z23 Encounter for immunization: Secondary | ICD-10-CM

## 2015-12-02 DIAGNOSIS — F0390 Unspecified dementia without behavioral disturbance: Secondary | ICD-10-CM

## 2015-12-02 DIAGNOSIS — I10 Essential (primary) hypertension: Secondary | ICD-10-CM

## 2015-12-02 DIAGNOSIS — C182 Malignant neoplasm of ascending colon: Secondary | ICD-10-CM

## 2015-12-02 DIAGNOSIS — D638 Anemia in other chronic diseases classified elsewhere: Secondary | ICD-10-CM

## 2015-12-02 LAB — CBC WITH DIFFERENTIAL/PLATELET
BASO%: 0.8 % (ref 0.0–2.0)
Basophils Absolute: 0 10*3/uL (ref 0.0–0.1)
EOS ABS: 0.3 10*3/uL (ref 0.0–0.5)
EOS%: 5.3 % (ref 0.0–7.0)
HCT: 37 % (ref 34.8–46.6)
HEMOGLOBIN: 12.2 g/dL (ref 11.6–15.9)
LYMPH%: 25.3 % (ref 14.0–49.7)
MCH: 29.3 pg (ref 25.1–34.0)
MCHC: 32.9 g/dL (ref 31.5–36.0)
MCV: 89.2 fL (ref 79.5–101.0)
MONO#: 0.4 10*3/uL (ref 0.1–0.9)
MONO%: 6.9 % (ref 0.0–14.0)
NEUT%: 61.7 % (ref 38.4–76.8)
NEUTROS ABS: 3.7 10*3/uL (ref 1.5–6.5)
PLATELETS: 279 10*3/uL (ref 145–400)
RBC: 4.15 10*6/uL (ref 3.70–5.45)
RDW: 17 % — AB (ref 11.2–14.5)
WBC: 6.1 10*3/uL (ref 3.9–10.3)
lymph#: 1.5 10*3/uL (ref 0.9–3.3)

## 2015-12-02 LAB — COMPREHENSIVE METABOLIC PANEL
ALBUMIN: 3.8 g/dL (ref 3.5–5.0)
ALK PHOS: 71 U/L (ref 40–150)
ALT: <9 U/L (ref 0–55)
ANION GAP: 12 meq/L — AB (ref 3–11)
AST: 17 U/L (ref 5–34)
BILIRUBIN TOTAL: 1.38 mg/dL — AB (ref 0.20–1.20)
BUN: 15.3 mg/dL (ref 7.0–26.0)
CALCIUM: 9.3 mg/dL (ref 8.4–10.4)
CO2: 25 mEq/L (ref 22–29)
Chloride: 104 mEq/L (ref 98–109)
Creatinine: 1.4 mg/dL — ABNORMAL HIGH (ref 0.6–1.1)
EGFR: 42 mL/min/{1.73_m2} — AB (ref >90)
Glucose: 78 mg/dl (ref 70–140)
Potassium: 3.8 mEq/L (ref 3.5–5.1)
Sodium: 140 mEq/L (ref 136–145)
TOTAL PROTEIN: 7.5 g/dL (ref 6.4–8.3)

## 2015-12-02 MED ORDER — INFLUENZA VAC SPLIT QUAD 0.5 ML IM SUSY
0.5000 mL | PREFILLED_SYRINGE | Freq: Once | INTRAMUSCULAR | Status: AC
  Administered 2015-12-02: 0.5 mL via INTRAMUSCULAR
  Filled 2015-12-02: qty 0.5

## 2015-12-02 MED ORDER — CAPECITABINE 500 MG PO TABS: 1000.0000 mg/m2 | ORAL_TABLET | Freq: Two times a day (BID) | ORAL | 0 refills | Status: DC

## 2015-12-02 MED FILL — CAPECITABINE 500 MG TABLET: 500 | 21 days supply | Qty: 126 | Fill #0

## 2015-12-02 NOTE — Telephone Encounter (Signed)
Spoke with [facility]ul[facility], [place] appointment rescheduled from 12/28/15 to 01/08/16 at 12:30pm, per MD request, per 12/02/15 los. Flu Injection added on 12/02/15 schedule, per schd msg. Message sent to chemo scheduler to add chemo. Avs report and appointment schedule given to patient, per 11/2715 los.

## 2015-12-02 NOTE — Patient Instructions (Signed)

## 2015-12-02 NOTE — Progress Notes (Signed)
[place]  [phone] [phone]  Clinic Follow Up Note   Patient Care Team: Beth Ruff, MD as PCP - General (Family Medicine) Beth Merle, MD as Consulting Physician (Medical Oncology) Beth Hausen, MD as Consulting Physician (General Surgery) Beth Cedars, MD as Consulting Physician (Orthopedic Surgery) Beth Gustin, MD as Consulting Physician (Urology) 12/02/2015     CHIEF COMPLAINTS:  Follow up metastatic colon cancer  Oncology History   Cancer of right colon [facility])   Staging form: Colon and Rectum, AJCC 7th Edition     Pathologic stage from 01/23/2015: Stage IIC (T4b, N0, cM0) - Signed by Beth Merle, MD on 02/05/2015       Cancer of right colon ([place])   01/21/2015 Imaging     CT abdomen and pelvis showed high-grade small bowel obstruction,  right I [place] ureteral nephrosis,  multiple liver cysts.  CT chest was negative for metastatic lesion.      01/23/2015 Pathology Results     invasive adenocarcinoma extending into the pericecal connective tissue,  grade 2, LVI (-),  no perineural invasion, 3 lymph nodes were negative.      01/23/2015 Surgery      terminal ileum and cecum seegmental resection with anastomosis by Dr. Hassell Wilkins      01/23/2015 Initial Diagnosis    Cancer of right colon ([place])      03/13/2015 - 07/06/2015 Chemotherapy    Xeloda 1500 mg bid X 14 days on-7 off, s/p 5 cycles, stopped early due to wrosening renal function      10/28/2015 Progression    PET scan showed multiple hypermetabolic peritoneal nodules, right ovarian cystic mass, and hypermetabolic liver lesion, highly suspicious for metastatic colon cancer. Small pulmonary nodules are indeterminate.        HISTORY OF PRESENTING ILLNESS (02/05/2015):  Beth Wilkins 75 y.o. female  with past medical history of moderate dementia, hypertension, is here because of recently diagnosed stage II colon cancer. She is accompanied to the clinic by her daughter.  History of manic is from the chart and her daughter.  She has had abdominal discomfort, nausea and vomiting, and norexia for  the past to 3 months along with 15 pounds weight loss, . Her nausea was mild and intermittent, she does not seek medical attention initially. She presented with  worsening symptoms for 2-3 weeks, and episodes of projectile vomiting and dehydration to emergency room, was admitted to [facility] on 01/20/2015. CT scan reviewed small bowel obstruction, and possible appendiceal rupture. She was brought to OR on 01/23/2015, underwent right hemicolectomy with anastomosis by Dr. Hassell Wilkins. Surgical path reviewed adenocarcinoma at the cecum. She was discharged home on 01/26/2015.  She has recovered well, eating much better than prior to surgery, move around without restriction. No pain, she has loose BM 3 times a day, and she has backed off her mirealax.  she lives with her daughter. She states her appetite is moderate, sometime low, and she refuses to eat if she doesn't have appetite. No nausea, vomiting, abdominal bloating since her surgery.  Her last colonoscopy was 6-8 years ago, and her stool OB was negative this year.   CURRENT THERAPY: Xeloda 1572m bid, 2 weeks on and one week off, started on 11/11/2015  INTERIM HISTORY: DAvereyreturns for follow-up. She is accompanied by her daughter to clinic today. She has completed one cycle of Xeloda one week ago, and just started cycle 2 today. She tolerated it very well, without any significant nausea, diarrhea,  skin rash, or other issues. She has been drinking fluids adequately, appetite remains to be tolerated, her weight is stable. No pain or other new complaints.  MEDICAL HISTORY:  Past Medical History:  Diagnosis Date  . Arthritis   . Bilateral renal cysts   . Centrilobular emphysema ([place])   . Chronic low back pain   . CKD (chronic kidney disease), stage III   . Colon cancer [facility]) oncologist-  dr Beth Wilkins   dx 01-23-2015  right  colon Invasive adenocarcinoma into pericecal connective tissue and involving small intestine , Stage IIc, Grade 2, LVI(-),  (pT4b N0 M0) /  s/p resection termial ileum and cecum and chemotherapy 01-06-217 to 07-06-2015/   08/ 2017  per PET  recurrent w/ liver mets and peritoneal carcinomatosis  . Complication of anesthesia    can be combative due to dementia  . Dementia    MODERATE  . Full dentures   . History of gastroesophageal reflux (GERD)    changes diet  . History of pelvic fracture   . Hydronephrosis, right   . Hyperlipidemia   . Hypertension   . Liver metastasis ([place])   . Pulmonary nodules   . Right ovarian cyst     SURGICAL HISTORY: Past Surgical History:  Procedure Laterality Date  . CATARACT EXTRACTION W/ INTRAOCULAR LENS  IMPLANT, BILATERAL  2005 approx  . CYSTOSCOPY WITH RETROGRADE PYELOGRAM, URETEROSCOPY AND STENT PLACEMENT Right 09/21/2015   Procedure: CYSTOSCOPY WITH BIL RETROGRADE PYELOGRAM, URETEROSCOPY, RIGHT STENT PLACEMENT ;  Surgeon: Beth Gustin, MD;  Location: [facility];  Service: Urology;  Laterality: Right;  . LAPAROSCOPY N/A 01/23/2015   Procedure: DIAGNOSTIC LAPAROSCOPY, EXPLORATORY LAPAROTOMY  RIGHT HEMI-COLECTOMY FOR OBSTRUCTION OF RIGHT TERMINAL ILEUM;  Surgeon: Beth Hausen, MD;  Location: [facility];  Service: General;  Laterality: N/A;    SOCIAL HISTORY: Social History   Social History  . Marital status: Single    Spouse name: N/A  . Number of children: N/A  . Years of education: N/A   Occupational History  . Not on file.   Social History Main Topics  . Smoking status: Former Smoker    Packs/day: 0.25    Years: 50.00    Types: Cigarettes    Quit date: 01/25/2015  . Smokeless tobacco: Never Used  . Alcohol use 1.8 oz/week    2 Glasses of wine, 1 Cans of beer per week     Comment: occasional   . Drug use: No  . Sexual activity: Not on file   Other Topics Concern  . Not on file   Social History Narrative    Legally separated   Lives w/daughter, Beth Wilkins   Has total of #2 daughters and #1 son   Dementia   Fall Risk-ambulates w/cane    FAMILY HISTORY: Family History  Problem Relation Age of Onset  . Diabetes Mellitus II Father   . Cancer Mother 48    ovarian cancer   . Cervical cancer Other     ALLERGIES:  is allergic to namenda [memantine hcl] and oxycodone.  MEDICATIONS:  Current Outpatient Prescriptions  Medication Sig Dispense Refill  . amLODipine (NORVASC) 5 MG tablet Take 5 mg by mouth every morning.     Marland Kitchen antiseptic oral rinse (BIOTENE) LIQD 15 mLs by Mouth Rinse route daily. BIOTENE CLINICAL    . capecitabine (XELODA) 500 MG tablet Take 3 tablets (1,500 mg total) by mouth 2 (two) times daily after a meal. 126 tablet 0  .  Cholecalciferol (VITAMIN D3) 2000 UNITS TABS Take by mouth daily.    . Coenzyme Q10 (CO Q 10) 100 MG CAPS Take by mouth daily.    Marland Kitchen donepezil (ARICEPT) 10 MG tablet Take 1 tab daily (Patient taking differently: Take 10 mg by mouth every evening. Take 1 tab daily) 30 tablet 11  . Multiple Vitamins-Minerals (WOMENS MULTIVITAMIN PO) Take 1 tablet by mouth daily.    . polyethylene glycol (MIRALAX / GLYCOLAX) packet Take 17 g by mouth daily. (Patient taking differently: Take 17 g by mouth daily as needed. ) 14 each 0  . simvastatin (ZOCOR) 20 MG tablet Take 20 mg by mouth daily at 6 PM.     No current facility-administered medications for this visit.     REVIEW OF SYSTEMS:   Constitutional: Denies fevers, chills or abnormal night sweats Eyes: Denies blurriness of vision, double vision or watery eyes Ears, nose, mouth, throat, and face: Denies mucositis or sore throat Respiratory: Denies cough, dyspnea or wheezes Cardiovascular: Denies palpitation, chest discomfort or lower extremity swelling Gastrointestinal:  Denies nausea, heartburn or change in bowel habits Skin: Denies abnormal skin rashes Lymphatics: Denies new lymphadenopathy or easy  bruising Neurological:Denies numbness, tingling or new weaknesses Behavioral/Psych: Mood is stable, no new changes  All other systems were reviewed with the patient and are negative.  PHYSICAL EXAMINATION: ECOG PERFORMANCE STATUS: 1 - Symptomatic but completely ambulatory  Vitals:   12/02/15 1313  BP: (!) 160/78  Pulse: 79  Resp: 18  Temp: 97.7 F (36.5 C)   Filed Weights   12/02/15 1313  Weight: 128 lb (58.1 kg)    GENERAL:alert, no distress and comfortable SKIN: skin color, texture, turgor are normal, no rashes or significant lesions EYES: normal, conjunctiva are pink and non-injected, sclera clear OROPHARYNX:no exudate, no erythema and lips, buccal mucosa, and tongue normal  NECK: supple, thyroid normal size, non-tender, without nodularity LYMPH:  no palpable lymphadenopathy in the cervical, axillary or inguinal LUNGS: clear to auscultation and percussion with normal breathing effort HEART: regular rate & rhythm and no murmurs and no lower extremity edema ABDOMEN:abdomen soft, non-tender and normal bowel sounds Musculoskeletal:no cyanosis of digits and no clubbing  PSYCH: alert & oriented x 3 with fluent speech NEURO: no focal motor/sensory deficits  LABORATORY DATA:  I have reviewed the data as listed CBC Latest Ref Rng & Units 12/02/2015 11/04/2015 10/12/2015  WBC 3.9 - 10.3 10e3/uL 6.1 6.7 6.6  Hemoglobin 11.6 - 15.9 g/dL 12.2 12.4 12.9  Hematocrit 34.8 - 46.6 % 37.0 37.9 38.4  Platelets 145 - 400 10e3/uL 279 300 283    CMP Latest Ref Rng & Units 12/02/2015 11/04/2015 10/12/2015  Glucose 70 - 140 mg/dl 78 87 138  BUN 7.0 - 26.0 mg/dL 15.3 17.4 20.1  Creatinine 0.6 - 1.1 mg/dL 1.4(H) 1.4(H) 1.3(H)  Sodium 136 - 145 mEq/L 140 142 143  Potassium 3.5 - 5.1 mEq/L 3.8 4.1 3.6  Chloride 101 - 111 mmol/L - - -  CO2 22 - 29 mEq/L _0 Calcium 8.4 - 10.4 mg/dL 9.3 9.3 10.5(H)  Total Protein 6.4 - 8.3 g/dL 7.5 7.9 7.9  Total Bilirubin 0.20 - 1.20 mg/dL 1.38(H) 0.99 0.84   Alkaline Phos 40 - 150 U/L 71 69 69  AST 5 - 34 U/L _1 ALT 0 - 55 U/L <9 <9 <9   CEA  Order: [id number]  Status:  Final result Visible to patient:  No (Not Released) Next appt:  12/28/2015 at  02:00 PM in Oncology [facility] Lab 4)    Ref Range & Units 1d ago 7moago   CEA 0.0 - 4.7 ng/mL 14.2  10.3CM          PATHOLOGY REPORT: Diagnosis 01/23/2015 Colon, segmental resection, with terminal ileum and cecum - INVASIVE ADENOCARCINOMA EXTENDING INTO PERICECAL CONNECTIVE TISSUE AND INVOLVING SMALL INTESTINE. - THREE BENIGN LYMPH NODES (0/3). - SEE ONCOLOGY TABLE BELOW.  Microscopic Comment COLON AND RECTUM (INCLUDING TRANS-ANAL RESECTION): Specimen: Terminal ileum and cecum. Procedure: Resection. Tumor site: Cecum adjacent to appendix. Specimen integrity: Intact. Macroscopic intactness of mesorectum: Not applicable. Macroscopic tumor perforation: No. Invasive tumor: Maximum size: 4.0 cm. Histologic type(s): Colorectal adenocarcinoma. Histologic grade and differentiation: G2: moderately differentiated/low grade. Type of polyp in which invasive carcinoma arose: No residual polyp. Microscopic extension of invasive tumor: Into pericecal connective tissue and terminal ileum. Lymph-Vascular invasion: Not identified. Peri-neural invasion: Not identified. Tumor deposit(s) (discontinuous extramural extension): No. Resection margins: Proximal margin: Free of tumor. Distal margin: Free of tumor. Circumferential (radial) (posterior ascending, posterior descending; lateral and posterior mid-rectum; and entire lower 1/3 rectum): Free of tumor. Mesenteric margin (sigmoid and transverse): N/A. Distance closest margin (if all above margins negative): N/A. Treatment effect (neo-adjuvant therapy): No. Additional polyp(s): No. Non-neoplastic findings: N/A. Lymph nodes: number examined 3; number positive: 0. Pathologic Staging: pT4b, pN0, pMX. Ancillary studies: Mismatch repair  protein by immunohistochemistry pending. Comments: There is a colorectal-type adenocarcinoma arising in the cecum in the area of the appendiceal orifice. The tumor extends into the pericecal connective tissue, and involves the attached segment of terminal ileum. Immunohistochemistry shows the tumor is positive with CDX2 and cytokeratin 20, and negative with cytokeratin 7, estrogen receptor and WT-1 supporting a diagnosis of primary colorectal adenocarcinoma. ADDITIONAL INFORMATION: Mismatch Repair (MMR) Protein Immunohistochemistry (IHC) IHC Expression Result: MLH1: Preserved nuclear expression (greater 50% tumor expression) MSH2: Preserved nuclear expression (greater 50% tumor expression) MSH6: Preserved nuclear expression (greater 50% tumor expression) PMS2: Preserved nuclear expression (greater 50% tumor expression) * Internal control demonstrates intact nuclear expression Interpretation: NORMAL There is preserved expression of the major and minor MMR proteins. There is a very low probability that microsatellite instability (MSI) is present. However, certain clinically significant MMR protein mutations may result in preservation of nuclear expression. It is recommended that the preservation of protein expression be correlated with molecular based MSI testing.    RADIOGRAPHIC STUDIES: I have personally reviewed the radiological images as listed and agreed with the findings in the report.  Ct chest, Abdomen Pelvis Wo Contrast 08/04/2015 IMPRESSION: 1. Irregular mass in cyst-like appearance along the right adnexa with termination of considerable right hydroureter in this vicinity. The rectosigmoid junction might also be tethered in this vicinity although this is uncertain. Pelvic sonography recommended for further characterization of the right ovary. The obstruction of the ureter is causing prominent right hydronephrosis. 2. Bilateral renal cysts and hepatic cysts. These appear  stable. 3. Centrilobular emphysema in the lungs. The ground-glass nodule posteriorly in the right upper lobe has nearly resolved, and was probably inflammatory. 4. There new bands of sclerosis anteriorly in the left second and third ribs, and also new deformity in the upper sternal body, suspicious for healing fractures. Has the patient sustained recent trauma? 5. Flaccid and unusual appearance the urinary bladder, but stable. 6. Aortoiliac atherosclerotic vascular disease. 7. Postoperative findings in the vicinity of the proximal right colon. Borderline prominent lymph node in the sigmoid mesocolon. 8. Healed right pelvic fractures.  UKoreapelvis 08/14/2015 IMPRESSION: Asymmetric  enlargement of the right ovary which contains a cystic and solid lesion. This scratch set the size of the left ovary is considered abnormal for a postmenopausal female. Cannot rule out underlying benign or malignant cystic ovarian neoplasm. Consider further investigation with either surgical resection or contrast enhanced MRI.  PET 10/28/2015 IMPRESSION: 1. 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 2. Hypermetabolic thickening adjacent to the cystic RIGHT ovary. Favor peritoneal carcinomatosis over primary RIGHT ovarian carcinoma 3. New hypermetabolic HEPATIC METASTASIS in the RIGHT hepatic lobe. 4. Moderate metabolic activity at the enteric colonic anastomosis in the RIGHT abdomen. 5. Small pulmonary nodules are indeterminate. Recommend attention on follow-up. 6. Overall impression favors recurrence of colorectal carcinoma with peritoneal carcinomatosis over ovarian carcinoma with Carcinomatosis.  ASSESSMENT & PLAN: 75 year old African-American female, with past medical history of moderate dementia, independent with ADLs, hypertension, presents with small bowel obstruction and weight loss.  1. 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 -I personally reviewed her recent PET scan images, unfortunately the scan showed hypermetabolic peritoneal and liver metastases. Given her elevated CEA, normal CA125, this is most consistent with recurrent colon cancer -We discussed is that her prostatic colon cancer is not curable at this stage, and the treatment goal is palliative. -She has started Xeloda 1551m bid, 2 weeks on and one week off. -I reviewed her [facility] one genomic testing as always patient and her daughter, her tumor has K-ras mutation, she is not a candidate for EGFR inhibitor. The tumor has MSI stable, she is not a candidate for immunotherapy, although she may be a candidate [facility] clinical [facility] -I recommend her to consider an fasting in addition to Xeloda. Potential benefit and side effects, especially hypertension, proteinuria, bleeding or thrombosis, bowel perforation, etc. were discussed with patient and her daughter, she agrees to proceed. We'll start with next cycle Xeloda  2. Anemia -Resolved after she came off chemotherapy  3. CKD stage III, right hydroureter -Her Cr was around 1 in 08/2013, Cr increased to 2.4 on 01/20/2015 when she was diagnosed with colon cancer due to dehydration, improved some and has been around 1.2-1.6 lately.  -Her recent CT scan showed right hydroureter, which probably contributes to her renal insufficiency. -she is scheduled to have ureter stent placement again tomorrow  4. HTN, dementia -She will continue her medication and follow-up with her primary care physician.   Plan -I reviewed her [facility] one genomic testing results -she has started cycle 2 Xeloda 15045mbid, 2 weeks on and one  week off, today  -will add Avastin with next cycle xeloda  -I will see her back in 3 weeks  All questions were answered. The patient knows to call the clinic with any problems, questions or concerns.  I spent 25 minutes counseling the patient face to face. The total time spent in the appointment was 30 minutes and more than 50% was on counseling.     FeTruitt MerleMD 12/02/2015

## 2015-12-03 ENCOUNTER — Ambulatory Visit (HOSPITAL_BASED_OUTPATIENT_CLINIC_OR_DEPARTMENT_OTHER): Payer: Medicare Other | Admitting: Anesthesiology

## 2015-12-03 ENCOUNTER — Telehealth: Payer: Self-pay | Admitting: *Deleted

## 2015-12-03 ENCOUNTER — Encounter (HOSPITAL_BASED_OUTPATIENT_CLINIC_OR_DEPARTMENT_OTHER): Payer: Self-pay | Admitting: Anesthesiology

## 2015-12-03 ENCOUNTER — Encounter (HOSPITAL_BASED_OUTPATIENT_CLINIC_OR_DEPARTMENT_OTHER)
Admission: RE | Disposition: A | Discharge status: Home / Self Care | Payer: Self-pay | Source: Ambulatory Visit | Attending: Urology

## 2015-12-03 ENCOUNTER — Ambulatory Visit (HOSPITAL_BASED_OUTPATIENT_CLINIC_OR_DEPARTMENT_OTHER)
Admission: RE | Admit: 2015-12-03 | Discharge: 2015-12-03 | Disposition: A | Discharge status: Home / Self Care | Payer: Medicare Other | Source: Ambulatory Visit | Attending: Urology | Admitting: Urology

## 2015-12-03 DIAGNOSIS — Z888 Allergy status to other drugs, medicaments and biological substances status: Secondary | ICD-10-CM | POA: Diagnosis not present

## 2015-12-03 DIAGNOSIS — E785 Hyperlipidemia, unspecified: Secondary | ICD-10-CM | POA: Insufficient documentation

## 2015-12-03 DIAGNOSIS — F0390 Unspecified dementia without behavioral disturbance: Secondary | ICD-10-CM | POA: Insufficient documentation

## 2015-12-03 DIAGNOSIS — K219 Gastro-esophageal reflux disease without esophagitis: Secondary | ICD-10-CM | POA: Insufficient documentation

## 2015-12-03 DIAGNOSIS — Z8041 Family history of malignant neoplasm of ovary: Secondary | ICD-10-CM | POA: Insufficient documentation

## 2015-12-03 DIAGNOSIS — Z833 Family history of diabetes mellitus: Secondary | ICD-10-CM | POA: Insufficient documentation

## 2015-12-03 DIAGNOSIS — C786 Secondary malignant neoplasm of retroperitoneum and peritoneum: Secondary | ICD-10-CM | POA: Diagnosis not present

## 2015-12-03 DIAGNOSIS — E1122 Type 2 diabetes mellitus with diabetic chronic kidney disease: Secondary | ICD-10-CM | POA: Diagnosis not present

## 2015-12-03 DIAGNOSIS — G8929 Other chronic pain: Secondary | ICD-10-CM | POA: Diagnosis not present

## 2015-12-03 DIAGNOSIS — C787 Secondary malignant neoplasm of liver and intrahepatic bile duct: Secondary | ICD-10-CM | POA: Insufficient documentation

## 2015-12-03 DIAGNOSIS — M545 Low back pain: Secondary | ICD-10-CM | POA: Diagnosis not present

## 2015-12-03 DIAGNOSIS — Z9841 Cataract extraction status, right eye: Secondary | ICD-10-CM | POA: Diagnosis not present

## 2015-12-03 DIAGNOSIS — N83201 Unspecified ovarian cyst, right side: Secondary | ICD-10-CM | POA: Diagnosis not present

## 2015-12-03 DIAGNOSIS — I129 Hypertensive chronic kidney disease with stage 1 through stage 4 chronic kidney disease, or unspecified chronic kidney disease: Secondary | ICD-10-CM | POA: Insufficient documentation

## 2015-12-03 DIAGNOSIS — C189 Malignant neoplasm of colon, unspecified: Secondary | ICD-10-CM | POA: Insufficient documentation

## 2015-12-03 DIAGNOSIS — Z8049 Family history of malignant neoplasm of other genital organs: Secondary | ICD-10-CM | POA: Insufficient documentation

## 2015-12-03 DIAGNOSIS — N183 Chronic kidney disease, stage 3 (moderate): Secondary | ICD-10-CM | POA: Insufficient documentation

## 2015-12-03 DIAGNOSIS — Z9842 Cataract extraction status, left eye: Secondary | ICD-10-CM | POA: Insufficient documentation

## 2015-12-03 DIAGNOSIS — Z885 Allergy status to narcotic agent status: Secondary | ICD-10-CM | POA: Diagnosis not present

## 2015-12-03 DIAGNOSIS — R918 Other nonspecific abnormal finding of lung field: Secondary | ICD-10-CM | POA: Diagnosis not present

## 2015-12-03 DIAGNOSIS — Z87891 Personal history of nicotine dependence: Secondary | ICD-10-CM | POA: Diagnosis not present

## 2015-12-03 DIAGNOSIS — N131 Hydronephrosis with ureteral stricture, not elsewhere classified: Secondary | ICD-10-CM | POA: Diagnosis not present

## 2015-12-03 DIAGNOSIS — M1990 Unspecified osteoarthritis, unspecified site: Secondary | ICD-10-CM | POA: Diagnosis not present

## 2015-12-03 DIAGNOSIS — J432 Centrilobular emphysema: Secondary | ICD-10-CM | POA: Insufficient documentation

## 2015-12-03 HISTORY — DX: Unspecified ovarian cyst, right side: N83.201

## 2015-12-03 HISTORY — DX: Other nonspecific abnormal finding of lung field: R91.8

## 2015-12-03 HISTORY — DX: Secondary malignant neoplasm of liver and intrahepatic bile duct: C78.7

## 2015-12-03 HISTORY — PX: CYSTOSCOPY W/ URETERAL STENT PLACEMENT: SHX1429

## 2015-12-03 SURGERY — CYSTOSCOPY, WITH RETROGRADE PYELOGRAM AND URETERAL STENT INSERTION
Anesthesia: General | Site: Ureter | Laterality: Right

## 2015-12-03 MED ORDER — CEFAZOLIN SODIUM-DEXTROSE 2-4 GM/100ML-% IV SOLN
2.0000 g | INTRAVENOUS | Status: AC
  Administered 2015-12-03: 2 g via INTRAVENOUS
  Filled 2015-12-03: qty 100

## 2015-12-03 MED ORDER — ONDANSETRON HCL 4 MG/2ML IJ SOLN
INTRAMUSCULAR | Status: AC
  Filled 2015-12-03: qty 2

## 2015-12-03 MED ORDER — ARTIFICIAL TEARS OP OINT
TOPICAL_OINTMENT | OPHTHALMIC | Status: AC
  Filled 2015-12-03: qty 3.5

## 2015-12-03 MED ORDER — CEFAZOLIN IN D5W 1 GM/50ML IV SOLN
1.0000 g | INTRAVENOUS | Status: DC
  Filled 2015-12-03: qty 50

## 2015-12-03 MED ORDER — SODIUM CHLORIDE 0.9 % IV SOLN
INTRAVENOUS | Status: DC
  Administered 2015-12-03: 10:00:00 via INTRAVENOUS
  Filled 2015-12-03: qty 1000

## 2015-12-03 MED ORDER — DEXAMETHASONE SODIUM PHOSPHATE 10 MG/ML IJ SOLN
INTRAMUSCULAR | Status: AC
  Filled 2015-12-03: qty 1

## 2015-12-03 MED ORDER — LIDOCAINE 2% (20 MG/ML) 5 ML SYRINGE
INTRAMUSCULAR | Status: DC | PRN
  Administered 2015-12-03: 50 mg via INTRAVENOUS

## 2015-12-03 MED ORDER — CEFAZOLIN SODIUM-DEXTROSE 2-4 GM/100ML-% IV SOLN
INTRAVENOUS | Status: AC
  Filled 2015-12-03: qty 100

## 2015-12-03 MED ORDER — FENTANYL CITRATE (PF) 100 MCG/2ML IJ SOLN
INTRAMUSCULAR | Status: DC | PRN
  Administered 2015-12-03 (×2): 25 ug via INTRAVENOUS

## 2015-12-03 MED ORDER — FENTANYL CITRATE (PF) 100 MCG/2ML IJ SOLN
INTRAMUSCULAR | Status: AC
  Filled 2015-12-03: qty 2

## 2015-12-03 MED ORDER — STERILE WATER FOR IRRIGATION IR SOLN
Status: DC | PRN
  Administered 2015-12-03: 3000 mL

## 2015-12-03 MED ORDER — PROPOFOL 10 MG/ML IV BOLUS
INTRAVENOUS | Status: DC | PRN
  Administered 2015-12-03: 100 mg via INTRAVENOUS

## 2015-12-03 MED ORDER — ONDANSETRON HCL 4 MG/2ML IJ SOLN
INTRAMUSCULAR | Status: DC | PRN
Start: 2015-12-03 — End: 2015-12-03
  Administered 2015-12-03: 4 mg via INTRAVENOUS

## 2015-12-03 MED ORDER — PROPOFOL 10 MG/ML IV BOLUS
INTRAVENOUS | Status: AC
  Filled 2015-12-03: qty 20

## 2015-12-03 MED ORDER — LIDOCAINE 2% (20 MG/ML) 5 ML SYRINGE
INTRAMUSCULAR | Status: AC
  Filled 2015-12-03: qty 5

## 2015-12-03 MED ORDER — FENTANYL CITRATE (PF) 100 MCG/2ML IJ SOLN
25.0000 ug | INTRAMUSCULAR | Status: DC | PRN
  Filled 2015-12-03: qty 1

## 2015-12-03 MED ORDER — DEXAMETHASONE SODIUM PHOSPHATE 4 MG/ML IJ SOLN
INTRAMUSCULAR | Status: DC | PRN
Start: 2015-12-03 — End: 2015-12-03
  Administered 2015-12-03: 4 mg via INTRAVENOUS

## 2015-12-03 SURGICAL SUPPLY — 25 items
BAG DRAIN URO-CYSTO SKYTR STRL (DRAIN) ×3 IMPLANT
BASKET DAKOTA 1.9FR 11X120 (BASKET) IMPLANT
BASKET LASER NITINOL 1.9FR (BASKET) IMPLANT
BASKET ZERO TIP NITINOL 2.4FR (BASKET) IMPLANT
CATH INTERMIT  6FR 70CM (CATHETERS) ×3 IMPLANT
CLOTH BEACON ORANGE TIMEOUT ST (SAFETY) ×3 IMPLANT
FIBER LASER FLEXIVA 365 (UROLOGICAL SUPPLIES) IMPLANT
FIBER LASER TRAC TIP (UROLOGICAL SUPPLIES) IMPLANT
GLOVE BIO SURGEON STRL SZ8 (GLOVE) ×3 IMPLANT
GOWN STRL REUS W/ TWL LRG LVL3 (GOWN DISPOSABLE) ×1 IMPLANT
GOWN STRL REUS W/ TWL XL LVL3 (GOWN DISPOSABLE) ×1 IMPLANT
GOWN STRL REUS W/TWL LRG LVL3 (GOWN DISPOSABLE) ×2
GOWN STRL REUS W/TWL XL LVL3 (GOWN DISPOSABLE) ×2
GUIDEWIRE ANG ZIPWIRE 038X150 (WIRE) ×3 IMPLANT
GUIDEWIRE STR DUAL SENSOR (WIRE) IMPLANT
IV NS IRRIG 3000ML ARTHROMATIC (IV SOLUTION) ×3 IMPLANT
KIT ROOM TURNOVER WOR (KITS) ×3 IMPLANT
MANIFOLD NEPTUNE II (INSTRUMENTS) IMPLANT
PACK CYSTO (CUSTOM PROCEDURE TRAY) ×3 IMPLANT
STENT URET 6FRX24 CONTOUR (STENTS) ×3 IMPLANT
STENT URET 6FRX26 CONTOUR (STENTS) IMPLANT
SYRINGE 10CC LL (SYRINGE) ×3 IMPLANT
TUBE CONNECTING 12'X1/4 (SUCTIONS)
TUBE CONNECTING 12X1/4 (SUCTIONS) IMPLANT
TUBE FEEDING 8FR 16IN STR KANG (MISCELLANEOUS) IMPLANT

## 2015-12-03 NOTE — Op Note (Signed)
.  Preoperative diagnosis: malignant right ureteral obstruction  Postoperative diagnosis: Same  Procedure: 1 cystoscopy 2. right retrograde pyelography 3.  Intraoperative fluoroscopy, under one hour, with interpretation 4. right 6 x 24 JJ stent placement  Attending: [name]  Anesthesia: General  Estimated blood loss: None  Drains: Right 6 x 24 JJ ureteral stent without tether  Specimens: none  Antibiotics: Ancef  Findings: right severe hydronephrosis to the level of the mid ureter. No masses/lesions in the bladder. Ureteral orifices in normal anatomic location.  Indications: Patient is a 75 year old female with a history of right malignant obstruction.  After discussing treatment options, they decided proceed with right stent placement.  Procedure her in detail: The patient was brought to the operating room and a brief timeout was done to ensure correct patient, correct procedure, correct site.  General anesthesia was administered patient was placed in dorsal lithotomy position.  Their genitalia was then prepped and draped in usual sterile fashion.  A rigid 20 French cystoscope was passed in the urethra and the bladder.  Bladder was inspected free masses or lesions.  the ureteral orifices were in the normal orthotopic locations.  a 6 french ureteral catheter was then instilled into the right ureteral orifice.  a gentle retrograde was obtained and findings noted above.  we then placed a zip wire through the ureteral catheter and advanced up to the renal pelvis.    We then placed a 6 x 24 double-j ureteral stent over the original zip wire.  We then removed the wire and good coil was noted in the the renal pelvis under fluoroscopy and the bladder under direct vision.  A foley catheter was then placed. the bladder was then drained and this concluded the procedure which was well tolerated by patient.  Complications: None  Condition: Stable, extubated, transferred to PACU  Plan:  Patient is to be discharged home. She will followup in 2 weeks for a renal US

## 2015-12-03 NOTE — Transfer of Care (Signed)
  Last Vitals:  Vitals:   12/03/15 0917  BP: (!) 123/57  Pulse: 79  Resp: 16  Temp: 36.7 C    Last Pain:  Vitals:   12/03/15 0917  TempSrc: Oral      Patients Stated Pain Goal: 8 (12/03/15 0939)  Immediate Anesthesia Transfer of Care Note  Patient: Beth Wilkins  Procedure(s) Performed: Procedure(s) (LRB): CYSTOSCOPY WITH RETROGRADE PYELOGRAM/URETERAL STENT PLACEMENT (Right)  Patient Location: PACU  Anesthesia Type: General  Level of Consciousness: awake, alert  and oriented  Airway & Oxygen Therapy: Patient Spontanous Breathing and Patient connected to face mask oxygen  Post-op Assessment: Report given to PACU RN and Post -op Vital signs reviewed and stable  Post vital signs: Reviewed and stable  Complications: No apparent anesthesia complications

## 2015-12-03 NOTE — H&P (Signed)
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 Ureteralscopy revealed no tumor and a stent was placed. The stent was then removed and the hydronephrosis worsened  Past Medical History:  Diagnosis Date  . Arthritis   . Bilateral renal cysts   . Centrilobular emphysema ([place])   . Chronic low back pain   . CKD (chronic kidney disease), stage III   . 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 Complication of anesthesia    can be combative due to dementia  . Dementia    MODERATE  . Full dentures   . History of gastroesophageal reflux (GERD)    changes diet  . History of pelvic fracture   . Hydronephrosis, right   . Hyperlipidemia   . Hypertension   . Liver metastasis ([place])   . Pulmonary nodules   . Right ovarian cyst    Past Surgical History:  Procedure Laterality Date  . CATARACT EXTRACTION W/ INTRAOCULAR LENS  IMPLANT, BILATERAL  2005 approx  . CYSTOSCOPY WITH RETROGRADE PYELOGRAM, URETEROSCOPY AND STENT PLACEMENT Right 09/21/2015   Procedure: CYSTOSCOPY WITH BIL RETROGRADE PYELOGRAM, URETEROSCOPY, RIGHT STENT PLACEMENT ;  Surgeon: Cleon Gustin, MD;  Location: [facility];  Service: Urology;  Laterality: Right;  . LAPAROSCOPY N/A 01/23/2015   Procedure: DIAGNOSTIC LAPAROSCOPY, EXPLORATORY LAPAROTOMY  RIGHT HEMI-COLECTOMY FOR OBSTRUCTION OF RIGHT TERMINAL ILEUM;  Surgeon: [name], MD;  Location: [facility];  Service: General;  Laterality: N/A;    Home Medications:  Prescriptions Prior to  Admission  Medication Sig Dispense Refill Last Dose  . amLODipine (NORVASC) 5 MG tablet Take 5 mg by mouth every morning.    12/02/2015 at Unknown time  . antiseptic oral rinse (BIOTENE) LIQD 15 mLs by Mouth Rinse route daily. BIOTENE CLINICAL   12/02/2015 at Unknown time  . capecitabine (XELODA) 500 MG tablet Take 3 tablets (1,500 mg total) by mouth 2 (two) times daily after a meal. 126 tablet 0 12/02/2015 at Unknown time  . Cholecalciferol (VITAMIN D3) 2000 UNITS TABS Take by mouth daily.   Taking  . Coenzyme Q10 (CO Q 10) 100 MG CAPS Take by mouth daily.   12/02/2015 at Unknown time  . donepezil (ARICEPT) 10 MG tablet Take 1 tab daily (Patient taking differently: Take 10 mg by mouth every evening. Take 1 tab daily) 30 tablet 11 12/02/2015 at Unknown time  . Multiple Vitamins-Minerals (WOMENS MULTIVITAMIN PO) Take 1 tablet by mouth daily.   12/02/2015 at Unknown time  . simvastatin (ZOCOR) 20 MG tablet Take 20 mg by mouth daily at 6 PM.   12/02/2015 at Unknown time  . polyethylene glycol (MIRALAX / GLYCOLAX) packet Take 17 g by mouth daily. (Patient taking differently: Take 17 g by mouth daily as needed. ) 14 each 0 More than a month at Unknown time   Allergies:  Allergies  Allergen Reactions  . Namenda [Memantine Hcl] Other (See Comments)    hallucinations  . Oxycodone Other (See Comments)    Hallucinations     Family History  Problem Relation Age  of Onset  . Diabetes Mellitus II Father   . Cancer Mother 33    ovarian cancer   . Cervical cancer Other    Social History:  reports that she quit smoking about 10 months ago. Her smoking use included Cigarettes. She has a 12.50 pack-year smoking history. She has never used smokeless tobacco. She reports that she drinks about 1.8 oz of alcohol per week . She reports that she does not use drugs.  Review of Systems  All other systems reviewed and are negative.   Physical Exam:  Vital signs in last 24 hours: Temp:  [97.7 F (36.5 C)-98.1 F  (36.7 C)] 98.1 F (36.7 C) (09/28 0917) Pulse Rate:  [79] 79 (09/28 0917) Resp:  [16-18] 16 (09/28 0917) BP: (123-160)/(57-78) 123/57 (09/28 0917) SpO2:  [100 %] 100 % (09/28 0917) Weight:  [56.7 kg (125 lb)-58.1 kg (128 lb)] 56.7 kg (125 lb) (09/28 1610) Physical Exam  Constitutional: She is oriented to person, place, and time. She appears well-developed and well-nourished.  HENT:  Head: Normocephalic and atraumatic.  Eyes: EOM are normal. Pupils are equal, round, and reactive to light.  Neck: Normal range of motion. No thyromegaly present.  Cardiovascular: Normal rate and regular rhythm.   Respiratory: Effort normal. No respiratory distress.  GI: Soft. She exhibits no distension.  Musculoskeletal: Normal range of motion. She exhibits no edema.  Neurological: She is alert and oriented to person, place, and time.  Skin: Skin is warm and dry.  Psychiatric: She has a normal mood and affect. Her behavior is normal. Judgment and thought content normal.    Laboratory Data:  Results for orders placed or performed in visit on 12/02/15 (from the past 24 hour(s))  CBC with Differential     Status: Abnormal   Collection Time: 12/02/15 11:57 AM  Result Value Ref Range   WBC 6.1 3.9 - 10.3 10e3/uL   NEUT# 3.7 1.5 - 6.5 10e3/uL   HGB 12.2 11.6 - 15.9 g/dL   HCT 37.0 34.8 - 46.6 %   Platelets 279 145 - 400 10e3/uL   MCV 89.2 79.5 - 101.0 fL   MCH 29.3 25.1 - 34.0 pg   MCHC 32.9 31.5 - 36.0 g/dL   RBC 4.15 3.70 - 5.45 10e6/uL   RDW 17.0 (H) 11.2 - 14.5 %   lymph# 1.5 0.9 - 3.3 10e3/uL   MONO# 0.4 0.1 - 0.9 10e3/uL   Eosinophils Absolute 0.3 0.0 - 0.5 10e3/uL   Basophils Absolute 0.0 0.0 - 0.1 10e3/uL   NEUT% 61.7 38.4 - 76.8 %   LYMPH% 25.3 14.0 - 49.7 %   MONO% 6.9 0.0 - 14.0 %   EOS% 5.3 0.0 - 7.0 %   BASO% 0.8 0.0 - 2.0 %   Narrative   Performed At:  [facility]               [address]               [place], [place] [address]  Comprehensive metabolic panel      Status: Abnormal   Collection Time: 12/02/15 11:57 AM  Result Value Ref Range   Sodium 140 136 - 145 mEq/L   Potassium 3.8 3.5 - 5.1 mEq/L   Chloride 104 98 - 109 mEq/L   CO2 25 22 - 29 mEq/L   Glucose 78 70 - 140 mg/dl   BUN 15.3 7.0 - 26.0 mg/dL   Creatinine 1.4 (H) 0.6 - 1.1 mg/dL  Total Bilirubin 1.38 (H) 0.20 - 1.20 mg/dL   Alkaline Phosphatase 71 40 - 150 U/L   AST 17 5 - 34 U/L   ALT <9 0 - 55 U/L   Total Protein 7.5 6.4 - 8.3 g/dL   Albumin 3.8 3.5 - 5.0 g/dL   Calcium 9.3 8.4 - 10.4 mg/dL   Anion Gap 12 (H) 3 - 11 mEq/L   EGFR 42 (L) >90 ml/min/1.73 m2   Narrative   Performed At:  [facility] [address]               [place], [place] [address]   No results found for this or any previous visit (from the past 240 hour(s)). 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

## 2015-12-03 NOTE — Telephone Encounter (Signed)
Per LOS I have scheduled appt and notified the scheduler 

## 2015-12-03 NOTE — Anesthesia Procedure Notes (Signed)
Procedure Name: LMA Insertion Date/Time: 12/03/2015 11:18 AM Performed by: Franne Grip Pre-anesthesia Checklist: Patient identified, Emergency Drugs available, Suction available and Patient being monitored Patient Re-evaluated:Patient Re-evaluated prior to inductionOxygen Delivery Method: Circle system utilized Preoxygenation: Pre-oxygenation with 100% oxygen Intubation Type: IV induction Ventilation: Mask ventilation without difficulty LMA: LMA inserted LMA Size: 4.0 Number of attempts: 1 Airway Equipment and Method: Bite block Placement Confirmation: positive ETCO2 Tube secured with: Tape Dental Injury: Teeth and Oropharynx as per pre-operative assessment

## 2015-12-03 NOTE — Anesthesia Preprocedure Evaluation (Addendum)
Anesthesia Evaluation  Patient identified by MRN, date of birth, ID band Patient awake    Reviewed: Allergy & Precautions, NPO status , Patient's Chart, lab work & pertinent test results  History of Anesthesia Complications (+) Emergence Delirium and history of anesthetic complications  Airway Mallampati: II  TM Distance: >3 FB Neck ROM: Full    Dental  (+) Edentulous Upper, Edentulous Lower   Pulmonary COPD, former smoker,    Pulmonary exam normal breath sounds clear to auscultation       Cardiovascular Exercise Tolerance: Good hypertension, Pt. on medications Normal cardiovascular exam Rhythm:Regular Rate:Normal     Neuro/Psych PSYCHIATRIC DISORDERS H/O Dementia    GI/Hepatic Neg liver ROS, GERD  ,  Endo/Other  negative endocrine ROS  Renal/GU Renal disease  negative genitourinary   Musculoskeletal  (+) Arthritis ,   Abdominal   Peds negative pediatric ROS (+)  Hematology  (+) anemia ,   Anesthesia Other Findings   Reproductive/Obstetrics negative OB ROS                            Anesthesia Physical Anesthesia Plan  ASA: III  Anesthesia Plan: General   Post-op Pain Management:    Induction: Intravenous  Airway Management Planned: LMA  Additional Equipment:   Intra-op Plan:   Post-operative Plan: Extubation in OR  Informed Consent: I have reviewed the patients History and Physical, chart, labs and discussed the procedure including the risks, benefits and alternatives for the proposed anesthesia with the patient or authorized representative who has indicated his/her understanding and acceptance.   Dental advisory given  Plan Discussed with: CRNA  Anesthesia Plan Comments:         Anesthesia Quick Evaluation

## 2015-12-03 NOTE — Discharge Instructions (Signed)
Ureteral Stent Implantation, Care After Refer to this sheet in the next few weeks. These instructions provide you with information on caring for yourself after your procedure. Your health care provider may also give you more specific instructions. Your treatment has been planned according to current medical practices, but problems sometimes occur. Call your health care provider if you have any problems or questions after your procedure. WHAT TO EXPECT AFTER THE PROCEDURE You should be back to normal activity within 48 hours after the procedure. Nausea and vomiting may occur and are commonly the result of anesthesia. It is common to experience sharp pain in the back or lower abdomen and penis with voiding. This is caused by movement of the ends of the stent with the act of urinating.It usually goes away within minutes after you have stopped urinating. HOME CARE INSTRUCTIONS Make sure to drink plenty of fluids. You may have small amounts of bleeding, causing your urine to be red. This is normal. Certain movements may trigger pain or a feeling that you need to urinate. You may be given medicines to prevent infection or bladder spasms. Be sure to take all medicines as directed. Only take over-the-counter or prescription medicines for pain, discomfort, or fever as directed by your health care provider. Do not take aspirin, as this can make bleeding worse. Your stent will be left in until the blockage is resolved. This may take 2 weeks or longer, depending on the reason for stent implantation. You may have an X-ray exam to make sure your ureter is open and that the stent has not moved out of position (migrated). The stent can be removed by your health care provider in the office. Medicines may be given for comfort while the stent is being removed. Be sure to keep all follow-up appointments so your health care provider can check that you are healing properly. SEEK MEDICAL CARE IF:  You experience increasing  pain.  Your pain medicine is not working. SEEK IMMEDIATE MEDICAL CARE IF:  Your urine is dark red or has blood clots.  You are leaking urine (incontinent).  You have a fever, chills, feeling sick to your stomach (nausea), or vomiting.  Your pain is not relieved by pain medicine.  The end of the stent comes out of the urethra.  You are unable to urinate.   This information is not intended to replace advice given to you by your health care provider. Make sure you discuss any questions you have with your health care provider.   Document Released: 10/24/2012 Document Revised: 02/26/2013 Document Reviewed: 09/05/2014 Elsevier Interactive Patient Education 2016 [place] [facility] Home Care Instructions  Activity: Get plenty of rest for the remainder of the day. A responsible adult should stay with you for 24 hours following the procedure.  For the next 24 hours, DO NOT: -Drive a car -Paediatric nurse -Drink alcoholic beverages -Take any medication unless instructed by your physician -Make any legal decisions or sign important papers.  Meals: Start with liquid foods such as gelatin or soup. Progress to regular foods as tolerated. Avoid greasy, spicy, heavy foods. If nausea and/or vomiting occur, drink only clear liquids until the nausea and/or vomiting subsides. Call your physician if vomiting continues.  Special Instructions/Symptoms: Your throat may feel dry or sore from the anesthesia or the breathing tube placed in your throat during surgery. If this causes discomfort, gargle with warm salt water. The discomfort should disappear within 24 hours.  If you had a scopolamine patch  placed behind your ear for the management of post- operative nausea and/or vomiting:  1. The medication in the patch is effective for 72 hours, after which it should be removed.  Wrap patch in a tissue and discard in the trash. Wash hands thoroughly with soap and water. 2. You may remove  the patch earlier than 72 hours if you experience unpleasant side effects which may include dry mouth, dizziness or visual disturbances. 3. Avoid touching the patch. Wash your hands with soap and water after contact with the patch.

## 2015-12-03 NOTE — Anesthesia Postprocedure Evaluation (Signed)
Anesthesia Post Note  Patient: JAKYLEE LINBERG  Procedure(s) Performed: Procedure(s) (LRB): CYSTOSCOPY WITH RETROGRADE PYELOGRAM/URETERAL STENT PLACEMENT (Right)  Patient location during evaluation: PACU Anesthesia Type: General Level of consciousness: awake and alert Pain management: pain level controlled Vital Signs Assessment: post-procedure vital signs reviewed and stable Respiratory status: spontaneous breathing, nonlabored ventilation, respiratory function stable and patient connected to nasal cannula oxygen Cardiovascular status: blood pressure returned to baseline and stable Postop Assessment: no signs of nausea or vomiting Anesthetic complications: no    Last Vitals:  Vitals:   12/03/15 1215 12/03/15 1230  BP: (!) 148/75 (!) 149/64  Pulse: 64 62  Resp: 12 17  Temp:      Last Pain:  Vitals:   12/03/15 0917  TempSrc: Oral                 Effie Berkshire

## 2015-12-04 ENCOUNTER — Encounter (HOSPITAL_BASED_OUTPATIENT_CLINIC_OR_DEPARTMENT_OTHER): Payer: Self-pay | Admitting: Urology

## 2015-12-23 ENCOUNTER — Other Ambulatory Visit: Payer: Self-pay | Admitting: Medical Oncology

## 2015-12-23 ENCOUNTER — Ambulatory Visit (HOSPITAL_BASED_OUTPATIENT_CLINIC_OR_DEPARTMENT_OTHER): Payer: Medicare Other

## 2015-12-23 ENCOUNTER — Telehealth: Payer: Self-pay | Admitting: Hematology

## 2015-12-23 ENCOUNTER — Ambulatory Visit (HOSPITAL_BASED_OUTPATIENT_CLINIC_OR_DEPARTMENT_OTHER): Payer: Medicare Other | Admitting: Hematology

## 2015-12-23 ENCOUNTER — Other Ambulatory Visit (HOSPITAL_BASED_OUTPATIENT_CLINIC_OR_DEPARTMENT_OTHER): Payer: Medicare Other

## 2015-12-23 VITALS — BP 150/78 | HR 77 | Temp 98.3°F | Resp 16

## 2015-12-23 DIAGNOSIS — Z79899 Other long term (current) drug therapy: Secondary | ICD-10-CM

## 2015-12-23 DIAGNOSIS — C180 Malignant neoplasm of cecum: Secondary | ICD-10-CM | POA: Diagnosis not present

## 2015-12-23 DIAGNOSIS — N183 Chronic kidney disease, stage 3 (moderate): Secondary | ICD-10-CM

## 2015-12-23 DIAGNOSIS — Z5112 Encounter for antineoplastic immunotherapy: Secondary | ICD-10-CM

## 2015-12-23 DIAGNOSIS — C182 Malignant neoplasm of ascending colon: Secondary | ICD-10-CM

## 2015-12-23 DIAGNOSIS — I10 Essential (primary) hypertension: Secondary | ICD-10-CM

## 2015-12-23 LAB — CBC WITH DIFFERENTIAL/PLATELET
BASO%: 0.4 % (ref 0.0–2.0)
BASOS ABS: 0 10*3/uL (ref 0.0–0.1)
EOS ABS: 0.3 10*3/uL (ref 0.0–0.5)
EOS%: 3.7 % (ref 0.0–7.0)
HEMATOCRIT: 34.3 % — AB (ref 34.8–46.6)
HEMOGLOBIN: 11.9 g/dL (ref 11.6–15.9)
LYMPH#: 1.7 10*3/uL (ref 0.9–3.3)
LYMPH%: 22.1 % (ref 14.0–49.7)
MCH: 30.6 pg (ref 25.1–34.0)
MCHC: 34.7 g/dL (ref 31.5–36.0)
MCV: 88.2 fL (ref 79.5–101.0)
MONO#: 0.7 10*3/uL (ref 0.1–0.9)
MONO%: 8.8 % (ref 0.0–14.0)
NEUT%: 65 % (ref 38.4–76.8)
NEUTROS ABS: 5 10*3/uL (ref 1.5–6.5)
Platelets: 231 10*3/uL (ref 145–400)
RBC: 3.89 10*6/uL (ref 3.70–5.45)
RDW: 19.5 % — AB (ref 11.2–14.5)
WBC: 7.6 10*3/uL (ref 3.9–10.3)

## 2015-12-23 LAB — COMPREHENSIVE METABOLIC PANEL
ALBUMIN: 3.9 g/dL (ref 3.5–5.0)
ALK PHOS: 74 U/L (ref 40–150)
ALT: 8 U/L (ref 0–55)
AST: 20 U/L (ref 5–34)
Anion Gap: 10 mEq/L (ref 3–11)
BILIRUBIN TOTAL: 1.8 mg/dL — AB (ref 0.20–1.20)
BUN: 12.8 mg/dL (ref 7.0–26.0)
CALCIUM: 9.7 mg/dL (ref 8.4–10.4)
CO2: 26 mEq/L (ref 22–29)
Chloride: 108 mEq/L (ref 98–109)
Creatinine: 1.3 mg/dL — ABNORMAL HIGH (ref 0.6–1.1)
EGFR: 47 mL/min/{1.73_m2} — AB (ref >90)
Glucose: 97 mg/dl (ref 70–140)
POTASSIUM: 3.6 meq/L (ref 3.5–5.1)
Sodium: 143 mEq/L (ref 136–145)
TOTAL PROTEIN: 7.6 g/dL (ref 6.4–8.3)

## 2015-12-23 LAB — UA PROTEIN, DIPSTICK - CHCC: PROTEIN: 30 mg/dL

## 2015-12-23 MED ORDER — SODIUM CHLORIDE 0.9 % IV SOLN
Freq: Once | INTRAVENOUS | Status: AC
  Administered 2015-12-23: 16:00:00 via INTRAVENOUS

## 2015-12-23 MED ORDER — BEVACIZUMAB CHEMO INJECTION 400 MG/16ML
7.7000 mg/kg | Freq: Once | INTRAVENOUS | Status: AC
  Administered 2015-12-23: 450 mg via INTRAVENOUS
  Filled 2015-12-23: qty 14

## 2015-12-23 MED ORDER — SODIUM CHLORIDE 0.9 % IV SOLN: 15.5000 mg/kg | Freq: Once | INTRAVENOUS | Status: DC

## 2015-12-23 NOTE — Patient Instructions (Signed)
Bevacizumab injection What is this medicine? BEVACIZUMAB (be va SIZ yoo mab) is a monoclonal antibody. It is used to treat cervical cancer, colorectal cancer, glioblastoma multiforme, non-small cell lung cancer (NSCLC), ovarian cancer, and renal cell cancer. This medicine may be used for other purposes; ask your health care provider or pharmacist if you have questions. What should I tell my health care provider before I take this medicine? They need to know if you have any of these conditions: -blood clots -heart disease, including heart failure, heart attack, or chest pain (angina) -high blood pressure -infection (especially a virus infection such as chickenpox, cold sores, or herpes) -kidney disease -lung disease -prior chemotherapy with doxorubicin, daunorubicin, epirubicin, or other anthracycline type chemotherapy agents -recent or ongoing radiation therapy -recent surgery -stroke -an unusual or allergic reaction to bevacizumab, hamster proteins, mouse proteins, other medicines, foods, dyes, or preservatives -pregnant or trying to get pregnant -breast-feeding How should I use this medicine? This medicine is for infusion into a vein. It is given by a health care professional in a hospital or clinic setting. Talk to your pediatrician regarding the use of this medicine in children. Special care may be needed. Overdosage: If you think you have taken too much of this medicine contact a poison control center or emergency room at once. NOTE: This medicine is only for you. Do not share this medicine with others. What if I miss a dose? It is important not to miss your dose. Call your doctor or health care professional if you are unable to keep an appointment. What may interact with this medicine? Interactions are not expected. This list may not describe all possible interactions. Give your health care provider a list of all the medicines, herbs, non-prescription drugs, or dietary supplements  you use. Also tell them if you smoke, drink alcohol, or use illegal drugs. Some items may interact with your medicine. What should I watch for while using this medicine? Your condition will be monitored carefully while you are receiving this medicine. You will need important blood work and urine testing done while you are taking this medicine. During your treatment, let your health care professional know if you have any unusual symptoms, such as difficulty breathing. This medicine may rarely cause 'gastrointestinal perforation' (holes in the stomach, intestines or colon), a serious side effect requiring surgery to repair. This medicine should be started at least 28 days following major surgery and the site of the surgery should be totally healed. Check with your doctor before scheduling dental work or surgery while you are receiving this treatment. Talk to your doctor if you have recently had surgery or if you have a wound that has not healed. Do not become pregnant while taking this medicine or for 6 months after stopping it. Women should inform their doctor if they wish to become pregnant or think they might be pregnant. There is a potential for serious side effects to an unborn child. Talk to your health care professional or pharmacist for more information. Do not breast-feed an infant while taking this medicine. This medicine has caused ovarian failure in some women. This medicine may interfere with the ability to have a child. You should talk to your doctor or health care professional if you are concerned about your fertility. What side effects may I notice from receiving this medicine? Side effects that you should report to your doctor or health care professional as soon as possible: -allergic reactions like skin rash, itching or hives, swelling of the face,   lips, or tongue -signs of infection - fever or chills, cough, sore throat, pain or trouble passing urine -signs of decreased platelets or  bleeding - bruising, pinpoint red spots on the skin, black, tarry stools, nosebleeds, blood in the urine -breathing problems -changes in vision -chest pain -confusion -jaw pain, especially after dental work -mouth sores -seizures -severe abdominal pain -severe headache -sudden numbness or weakness of the face, arm or leg -swelling of legs or ankles -symptoms of a stroke: change in mental awareness, inability to talk or move one side of the body (especially in patients with lung cancer) -trouble passing urine or change in the amount of urine -trouble speaking or understanding -trouble walking, dizziness, loss of balance or coordination Side effects that usually do not require medical attention (report to your doctor or health care professional if they continue or are bothersome): -constipation -diarrhea -dry skin -headache -loss of appetite -nausea, vomiting This list may not describe all possible side effects. Call your doctor for medical advice about side effects. You may report side effects to FDA at 1-800-FDA-1088. Where should I keep my medicine? This drug is given in a hospital or clinic and will not be stored at home. NOTE: This sheet is a summary. It may not cover all possible information. If you have questions about this medicine, talk to your doctor, pharmacist, or health care provider.    2016, Elsevier/Gold Standard. (2014-04-22 16:58:44)[facility] Discharge Instructions for Patients Receiving Chemotherapy  Today you received the following chemotherapy agents:  Avastin.  To help prevent nausea and vomiting after your treatment, we encourage you to take your nausea medication as directed.   If you develop nausea and vomiting that is not controlled by your nausea medication, call the clinic.   BELOW ARE SYMPTOMS THAT SHOULD BE REPORTED IMMEDIATELY:  *FEVER GREATER THAN 100.5 F  *CHILLS WITH OR WITHOUT FEVER  NAUSEA AND VOMITING THAT IS NOT  CONTROLLED WITH YOUR NAUSEA MEDICATION  *UNUSUAL SHORTNESS OF BREATH  *UNUSUAL BRUISING OR BLEEDING  TENDERNESS IN MOUTH AND THROAT WITH OR WITHOUT PRESENCE OF ULCERS  *URINARY PROBLEMS  *BOWEL PROBLEMS  UNUSUAL RASH Items with * indicate a potential emergency and should be followed up as soon as possible.  Feel free to call the clinic you have any questions or concerns. The clinic phone number is [phone].  Please show the [facility] at check-in to the Emergency Department and triage nurse.

## 2015-12-23 NOTE — Telephone Encounter (Signed)
Gave patient avs report and appointments for November.  °

## 2015-12-23 NOTE — Progress Notes (Signed)
[place]  [phone] [phone]  Clinic Follow Up Note   Patient Care Team: [name], MD as PCP - General (Family Medicine) [name], MD as Consulting Physician (Medical Oncology) [name], MD as Consulting Physician (General Surgery) [name], MD as Consulting Physician (Orthopedic Surgery) Cleon Gustin, MD as Consulting Physician (Urology) 12/23/2015     CHIEF COMPLAINTS:  Follow up metastatic colon cancer  Oncology History   Cancer of right colon [facility])   Staging form: Colon and Rectum, AJCC 7th Edition     Pathologic stage from 01/23/2015: Stage IIC (T4b, N0, cM0) - Signed by [name], MD on 02/05/2015       Cancer of right colon ([place])   01/21/2015 Imaging     CT abdomen and pelvis showed high-grade small bowel obstruction,  right I [place] ureteral nephrosis,  multiple liver cysts.  CT chest was negative for metastatic lesion.      01/23/2015 Pathology Results     invasive adenocarcinoma extending into the pericecal connective tissue,  grade 2, LVI (-),  no perineural invasion, 3 lymph nodes were negative.      01/23/2015 Surgery      terminal ileum and cecum seegmental resection with anastomosis by Dr. Hassell Done      01/23/2015 Initial Diagnosis    Cancer of right colon ([place])      03/13/2015 - 07/06/2015 Chemotherapy    Xeloda 1500 mg bid X 14 days on-7 off, s/p 5 cycles, stopped early due to wrosening renal function      10/28/2015 Progression    PET scan showed multiple hypermetabolic peritoneal nodules, right ovarian cystic mass, and hypermetabolic liver lesion, highly suspicious for metastatic colon cancer. Small pulmonary nodules are indeterminate.        HISTORY OF PRESENTING ILLNESS (02/05/2015):  Beth Wilkins 75 y.o. female  with past medical history of moderate dementia, hypertension, is here because of recently diagnosed stage II colon cancer. She is accompanied to the clinic by her daughter.  History of manic is from the chart and her daughter.  She has had abdominal discomfort, nausea and vomiting, and norexia for  the past to 3 months along with 15 pounds weight loss, . Her nausea was mild and intermittent, she does not seek medical attention initially. She presented with  worsening symptoms for 2-3 weeks, and episodes of projectile vomiting and dehydration to emergency room, was admitted to [facility] on 01/20/2015. CT scan reviewed small bowel obstruction, and possible appendiceal rupture. She was brought to OR on 01/23/2015, underwent right hemicolectomy with anastomosis by Dr. Hassell Done. Surgical path reviewed adenocarcinoma at the cecum. She was discharged home on 01/26/2015.  She has recovered well, eating much better than prior to surgery, move around without restriction. No pain, she has loose BM 3 times a day, and she has backed off her mirealax.  she lives with her daughter. She states her appetite is moderate, sometime low, and she refuses to eat if she doesn't have appetite. No nausea, vomiting, abdominal bloating since her surgery.  Her last colonoscopy was 6-8 years ago, and her stool OB was negative this year.   CURRENT THERAPY: Xeloda 1535m bid, 2 weeks on and one week off, started on 11/11/2015  INTERIM HISTORY: Beth Wilkins for follow-up. She is accompanied by her daughter to clinic today. She has tolerating Xeloda well, without significant side effects. She denies any significant pain, abdominal bloating, or other new symptoms. No skin rash. Her weight is  stable.  MEDICAL HISTORY:  Past Medical History:  Diagnosis Date  . Arthritis   . Bilateral renal cysts   . Centrilobular emphysema ([place])   . Chronic low back pain   . CKD (chronic kidney disease), stage III   . Colon cancer [facility]) oncologist-  dr [name]   dx 01-23-2015  right colon Invasive adenocarcinoma into pericecal connective tissue and involving small intestine , Stage IIc, Grade 2, LVI(-),  (pT4b N0 M0) /   s/p resection termial ileum and cecum and chemotherapy 01-06-217 to 07-06-2015/   08/ 2017  per PET  recurrent w/ liver mets and peritoneal carcinomatosis  . Complication of anesthesia    can be combative due to dementia  . Dementia    MODERATE  . Full dentures   . History of gastroesophageal reflux (GERD)    changes diet  . History of pelvic fracture   . Hydronephrosis, right   . Hyperlipidemia   . Hypertension   . Liver metastasis ([place])   . Pulmonary nodules   . Right ovarian cyst     SURGICAL HISTORY: Past Surgical History:  Procedure Laterality Date  . CATARACT EXTRACTION W/ INTRAOCULAR LENS  IMPLANT, BILATERAL  2005 approx  . CYSTOSCOPY W/ URETERAL STENT PLACEMENT Right 12/03/2015   Procedure: CYSTOSCOPY WITH RETROGRADE PYELOGRAM/URETERAL STENT PLACEMENT;  Surgeon: Cleon Gustin, MD;  Location: [facility];  Service: Urology;  Laterality: Right;  . CYSTOSCOPY WITH RETROGRADE PYELOGRAM, URETEROSCOPY AND STENT PLACEMENT Right 09/21/2015   Procedure: CYSTOSCOPY WITH BIL RETROGRADE PYELOGRAM, URETEROSCOPY, RIGHT STENT PLACEMENT ;  Surgeon: Cleon Gustin, MD;  Location: [facility];  Service: Urology;  Laterality: Right;  . LAPAROSCOPY N/A 01/23/2015   Procedure: DIAGNOSTIC LAPAROSCOPY, EXPLORATORY LAPAROTOMY  RIGHT HEMI-COLECTOMY FOR OBSTRUCTION OF RIGHT TERMINAL ILEUM;  Surgeon: [name], MD;  Location: [facility];  Service: General;  Laterality: N/A;    SOCIAL HISTORY: Social History   Social History  . Marital status: Single    Spouse name: N/A  . Number of children: N/A  . Years of education: N/A   Occupational History  . Not on file.   Social History Main Topics  . Smoking status: Former Smoker    Packs/day: 0.25    Years: 50.00    Types: Cigarettes    Quit date: 01/25/2015  . Smokeless tobacco: Never Used  . Alcohol use 1.8 oz/week    2 Glasses of wine, 1 Cans of beer per week     Comment: occasional   . Drug use: No    . Sexual activity: Not on file   Other Topics Concern  . Not on file   Social History Narrative   Legally separated   Lives w/daughter, Tamma Brigandi   Has total of #2 daughters and #1 son   Dementia   Fall Risk-ambulates w/cane    FAMILY HISTORY: Family History  Problem Relation Age of Onset  . Diabetes Mellitus II Father   . Cancer Mother 72    ovarian cancer   . Cervical cancer Other     ALLERGIES:  is allergic to namenda [memantine hcl] and oxycodone.  MEDICATIONS:  Current Outpatient Prescriptions  Medication Sig Dispense Refill  . amLODipine (NORVASC) 5 MG tablet Take 5 mg by mouth every morning.     Marland Kitchen antiseptic oral rinse (BIOTENE) LIQD 15 mLs by Mouth Rinse route daily. BIOTENE CLINICAL    . capecitabine (XELODA) 500 MG tablet Take 3 tablets (1,500 mg total) by mouth  2 (two) times daily after a meal. 126 tablet 0  . Cholecalciferol (VITAMIN D3) 2000 UNITS TABS Take by mouth daily.    . Coenzyme Q10 (CO Q 10) 100 MG CAPS Take by mouth daily.    Marland Kitchen donepezil (ARICEPT) 10 MG tablet Take 1 tab daily (Patient taking differently: Take 10 mg by mouth every evening. Take 1 tab daily) 30 tablet 11  . Multiple Vitamins-Minerals (WOMENS MULTIVITAMIN PO) Take 1 tablet by mouth daily.    . polyethylene glycol (MIRALAX / GLYCOLAX) packet Take 17 g by mouth daily. (Patient taking differently: Take 17 g by mouth daily as needed. ) 14 each 0  . simvastatin (ZOCOR) 20 MG tablet Take 20 mg by mouth daily at 6 PM.     No current facility-administered medications for this visit.     REVIEW OF SYSTEMS:   Constitutional: Denies fevers, chills or abnormal night sweats Eyes: Denies blurriness of vision, double vision or watery eyes Ears, nose, mouth, throat, and face: Denies mucositis or sore throat Respiratory: Denies cough, dyspnea or wheezes Cardiovascular: Denies palpitation, chest discomfort or lower extremity swelling Gastrointestinal:  Denies nausea, heartburn or change in  bowel habits Skin: Denies abnormal skin rashes Lymphatics: Denies new lymphadenopathy or easy bruising Neurological:Denies numbness, tingling or new weaknesses Behavioral/Psych: Mood is stable, no new changes  All other systems were reviewed with the patient and are negative.  PHYSICAL EXAMINATION: ECOG PERFORMANCE STATUS: 1 - Symptomatic but completely ambulatory  Vitals:   12/23/15 1456  BP: (!) 148/77  Pulse: 84  Resp: 18  Temp: 97.6 F (36.4 C)   Filed Weights   12/23/15 1456  Weight: 127 lb 9.6 oz (57.9 kg)    GENERAL:alert, no distress and comfortable SKIN: skin color, texture, turgor are normal, no rashes or significant lesions EYES: normal, conjunctiva are pink and non-injected, sclera clear OROPHARYNX:no exudate, no erythema and lips, buccal mucosa, and tongue normal  NECK: supple, thyroid normal size, non-tender, without nodularity LYMPH:  no palpable lymphadenopathy in the cervical, axillary or inguinal LUNGS: clear to auscultation and percussion with normal breathing effort HEART: regular rate & rhythm and no murmurs and no lower extremity edema ABDOMEN:abdomen soft, non-tender and normal bowel sounds Musculoskeletal:no cyanosis of digits and no clubbing  PSYCH: alert & oriented x 3 with fluent speech NEURO: no focal motor/sensory deficits  LABORATORY DATA:  I have reviewed the data as listed CBC Latest Ref Rng & Units 12/23/2015 12/02/2015 11/04/2015  WBC 3.9 - 10.3 10e3/uL 7.6 6.1 6.7  Hemoglobin 11.6 - 15.9 g/dL 11.9 12.2 12.4  Hematocrit 34.8 - 46.6 % 34.3(L) 37.0 37.9  Platelets 145 - 400 10e3/uL 231 279 300    CMP Latest Ref Rng & Units 12/23/2015 12/02/2015 11/04/2015  Glucose 70 - 140 mg/dl 97 78 87  BUN 7.0 - 26.0 mg/dL 12.8 15.3 17.4  Creatinine 0.6 - 1.1 mg/dL 1.3(H) 1.4(H) 1.4(H)  Sodium 136 - 145 mEq/L 143 140 142  Potassium 3.5 - 5.1 mEq/L 3.6 3.8 4.1  Chloride 101 - 111 mmol/L - - -  CO2 22 - 29 mEq/L _0 Calcium 8.4 - 10.4 mg/dL 9.7  9.3 9.3  Total Protein 6.4 - 8.3 g/dL 7.6 7.5 7.9  Total Bilirubin 0.20 - 1.20 mg/dL 1.80(H) 1.38(H) 0.99  Alkaline Phos 40 - 150 U/L 74 71 69  AST 5 - 34 U/L _1 ALT 0 - 55 U/L 8 <9 <9   CEA  Results for Henckel, Bryer R (  MRN [id number]) as of 12/26/2015 17:46  Ref. Range 10/12/2015 16:02 11/04/2015 12:39  Cancer Antigen (CA) 125 Latest Ref Range: 0.0 - 38.1 U/mL 6.1   CEA Latest Ref Range: 0.0 - 4.7 ng/mL  14.0 (H)  CEA ([facility]) Latest Ref Range: 0.00 - 5.00 ng/mL  12.83 (H)     PATHOLOGY REPORT: Diagnosis 01/23/2015 Colon, segmental resection, with terminal ileum and cecum - INVASIVE ADENOCARCINOMA EXTENDING INTO PERICECAL CONNECTIVE TISSUE AND INVOLVING SMALL INTESTINE. - THREE BENIGN LYMPH NODES (0/3). - SEE ONCOLOGY TABLE BELOW.  Microscopic Comment COLON AND RECTUM (INCLUDING TRANS-ANAL RESECTION): Specimen: Terminal ileum and cecum. Procedure: Resection. Tumor site: Cecum adjacent to appendix. Specimen integrity: Intact. Macroscopic intactness of mesorectum: Not applicable. Macroscopic tumor perforation: No. Invasive tumor: Maximum size: 4.0 cm. Histologic type(s): Colorectal adenocarcinoma. Histologic grade and differentiation: G2: moderately differentiated/low grade. Type of polyp in which invasive carcinoma arose: No residual polyp. Microscopic extension of invasive tumor: Into pericecal connective tissue and terminal ileum. Lymph-Vascular invasion: Not identified. Peri-neural invasion: Not identified. Tumor deposit(s) (discontinuous extramural extension): No. Resection margins: Proximal margin: Free of tumor. Distal margin: Free of tumor. Circumferential (radial) (posterior ascending, posterior descending; lateral and posterior mid-rectum; and entire lower 1/3 rectum): Free of tumor. Mesenteric margin (sigmoid and transverse): N/A. Distance closest margin (if all above margins negative): N/A. Treatment effect (neo-adjuvant therapy):  No. Additional polyp(s): No. Non-neoplastic findings: N/A. Lymph nodes: number examined 3; number positive: 0. Pathologic Staging: pT4b, pN0, pMX. Ancillary studies: Mismatch repair protein by immunohistochemistry pending. Comments: There is a colorectal-type adenocarcinoma arising in the cecum in the area of the appendiceal orifice. The tumor extends into the pericecal connective tissue, and involves the attached segment of terminal ileum. Immunohistochemistry shows the tumor is positive with CDX2 and cytokeratin 20, and negative with cytokeratin 7, estrogen receptor and WT-1 supporting a diagnosis of primary colorectal adenocarcinoma. ADDITIONAL INFORMATION: Mismatch Repair (MMR) Protein Immunohistochemistry (IHC) IHC Expression Result: MLH1: Preserved nuclear expression (greater 50% tumor expression) MSH2: Preserved nuclear expression (greater 50% tumor expression) MSH6: Preserved nuclear expression (greater 50% tumor expression) PMS2: Preserved nuclear expression (greater 50% tumor expression) * Internal control demonstrates intact nuclear expression Interpretation: NORMAL There is preserved expression of the major and minor MMR proteins. There is a very low probability that microsatellite instability (MSI) is present. However, certain clinically significant MMR protein mutations may result in preservation of nuclear expression. It is recommended that the preservation of protein expression be correlated with molecular based MSI testing.    RADIOGRAPHIC STUDIES: I have personally reviewed the radiological images as listed and agreed with the findings in the report.  Ct chest, Abdomen Pelvis Wo Contrast 08/04/2015 IMPRESSION: 1. Irregular mass in cyst-like appearance along the right adnexa with termination of considerable right hydroureter in this vicinity. The rectosigmoid junction might also be tethered in this vicinity although this is uncertain. Pelvic sonography recommended  for further characterization of the right ovary. The obstruction of the ureter is causing prominent right hydronephrosis. 2. Bilateral renal cysts and hepatic cysts. These appear stable. 3. Centrilobular emphysema in the lungs. The ground-glass nodule posteriorly in the right upper lobe has nearly resolved, and was probably inflammatory. 4. There new bands of sclerosis anteriorly in the left second and third ribs, and also new deformity in the upper sternal body, suspicious for healing fractures. Has the patient sustained recent trauma? 5. Flaccid and unusual appearance the urinary bladder, but stable. 6. Aortoiliac atherosclerotic vascular disease. 7. Postoperative findings in the vicinity of the proximal right  colon. Borderline prominent lymph node in the sigmoid mesocolon. 8. Healed right pelvic fractures.  US pelvis 08/14/2015 IMPRESSION: Asymmetric enlargement of the right ovary which contains a cystic and solid lesion. This scratch set the size of the left ovary is considered abnormal for a postmenopausal female. Cannot rule out underlying benign or malignant cystic ovarian neoplasm. Consider further investigation with either surgical resection or contrast enhanced MRI.  PET 10/28/2015 IMPRESSION: 1. 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 2. Hypermetabolic thickening adjacent to the cystic RIGHT ovary. Favor peritoneal carcinomatosis over primary RIGHT ovarian carcinoma 3. New hypermetabolic HEPATIC METASTASIS in the RIGHT hepatic lobe. 4. Moderate metabolic activity at the enteric colonic anastomosis in the RIGHT abdomen. 5. Small pulmonary nodules are indeterminate. Recommend attention on follow-up. 6. Overall impression favors recurrence of colorectal carcinoma with peritoneal carcinomatosis over ovarian carcinoma with Carcinomatosis.  ASSESSMENT & PLAN: 75 year old African-American female, with past medical history of moderate  dementia, independent with ADLs, hypertension, presents with small bowel obstruction and weight loss.  1. 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 -I personally reviewed her recent PET scan images, unfortunately the scan showed hypermetabolic peritoneal and liver metastases. Given her elevated CEA, normal CA125, this is most consistent with recurrent colon cancer -We discussed is that her prostatic colon cancer is not curable at this stage, and the treatment goal is palliative. -She has started Xeloda 1570m bid, 2 weeks on and one week off. -I reviewed her [facility] one genomic testing as always patient and her daughter, her tumor has K-ras mutation, she is not a candidate for EGFR inhibitor. The tumor has MSI stable, she is not a candidate for immunotherapy, although she may be a candidate for clinical trial of immunotherapy [facility] [facility] -she is tolerating Xeloda well, we'll add on Avastin from this cycle -lab results reviewed, she has mildly elevated bilirubin, we'll continue monitoring. -plan to repeat CT scan in 3 weeks   2. Anemia -mild, stable, we'll continue monitoring  3. CKD stage III, right hydroureter -Her Cr was around 1 in 08/2013, Cr increased to 2.4 on 01/20/2015 when she was diagnosed with colon cancer due to dehydration, improved some and has been around 1.2-1.6 lately.  -Her recent CT scan showed right hydroureter, which probably contributes to her renal insufficiency. -she had ureter stent placement again in 12/2015  4. HTN, dementia -She will continue her medication and follow-up with her primary care physician.  5. Hyperbilirubinemia -Possibly related to Xeloda versus  liver metastasis -We'll continue monitoring closely. If it gets much worse, we'll hold on her chemotherapy and repeat scan   Plan -lab reviewed, will proceed cycle 3 Xeloda and start Avastin 7.565mkg every 3 weeks today  -I will see her back in 3 weeks, will order restaging CT on next visit   All questions were answered. The patient knows to call the clinic with any problems, questions or concerns.  I spent 20 minutes counseling the patient face to face. The total time spent in the appointment was 25 minutes and more than 50% was on counseling.     FeTruitt MerleMD 12/23/2015

## 2015-12-26 ENCOUNTER — Encounter: Payer: Self-pay | Admitting: Hematology

## 2015-12-28 ENCOUNTER — Ambulatory Visit: Payer: Medicare Other | Admitting: Neurology

## 2015-12-28 ENCOUNTER — Other Ambulatory Visit: Payer: Medicare Other

## 2015-12-28 ENCOUNTER — Ambulatory Visit (HOSPITAL_COMMUNITY): Payer: Medicare Other

## 2015-12-29 ENCOUNTER — Ambulatory Visit: Payer: Medicare Other | Admitting: Neurology

## 2016-01-04 ENCOUNTER — Ambulatory Visit: Payer: Medicare Other | Admitting: Hematology

## 2016-01-05 ENCOUNTER — Other Ambulatory Visit: Payer: Self-pay | Admitting: Urology

## 2016-01-08 ENCOUNTER — Ambulatory Visit (HOSPITAL_COMMUNITY)
Admission: RE | Admit: 2016-01-08 | Discharge: 2016-01-08 | Disposition: A | Discharge status: Home / Self Care | Payer: Medicare Other | Source: Ambulatory Visit | Attending: Hematology | Admitting: Hematology

## 2016-01-08 DIAGNOSIS — N281 Cyst of kidney, acquired: Secondary | ICD-10-CM | POA: Insufficient documentation

## 2016-01-08 DIAGNOSIS — M5136 Other intervertebral disc degeneration, lumbar region: Secondary | ICD-10-CM | POA: Diagnosis not present

## 2016-01-08 DIAGNOSIS — M47896 Other spondylosis, lumbar region: Secondary | ICD-10-CM | POA: Insufficient documentation

## 2016-01-08 DIAGNOSIS — K7689 Other specified diseases of liver: Secondary | ICD-10-CM | POA: Diagnosis not present

## 2016-01-08 DIAGNOSIS — C787 Secondary malignant neoplasm of liver and intrahepatic bile duct: Secondary | ICD-10-CM | POA: Diagnosis not present

## 2016-01-08 DIAGNOSIS — I708 Atherosclerosis of other arteries: Secondary | ICD-10-CM | POA: Diagnosis not present

## 2016-01-08 DIAGNOSIS — C786 Secondary malignant neoplasm of retroperitoneum and peritoneum: Secondary | ICD-10-CM | POA: Insufficient documentation

## 2016-01-08 DIAGNOSIS — I700 Atherosclerosis of aorta: Secondary | ICD-10-CM | POA: Insufficient documentation

## 2016-01-08 DIAGNOSIS — C182 Malignant neoplasm of ascending colon: Secondary | ICD-10-CM | POA: Insufficient documentation

## 2016-01-13 ENCOUNTER — Encounter: Payer: Self-pay | Admitting: Hematology

## 2016-01-13 ENCOUNTER — Telehealth: Payer: Self-pay | Admitting: Hematology

## 2016-01-13 ENCOUNTER — Other Ambulatory Visit (HOSPITAL_BASED_OUTPATIENT_CLINIC_OR_DEPARTMENT_OTHER): Payer: Medicare Other

## 2016-01-13 ENCOUNTER — Telehealth: Payer: Self-pay | Admitting: *Deleted

## 2016-01-13 ENCOUNTER — Ambulatory Visit (HOSPITAL_BASED_OUTPATIENT_CLINIC_OR_DEPARTMENT_OTHER): Payer: Medicare Other

## 2016-01-13 ENCOUNTER — Ambulatory Visit (HOSPITAL_BASED_OUTPATIENT_CLINIC_OR_DEPARTMENT_OTHER): Payer: Medicare Other | Admitting: Hematology

## 2016-01-13 VITALS — BP 129/85 | HR 80 | Resp 16

## 2016-01-13 VITALS — BP 169/85 | HR 90 | Temp 97.7°F | Resp 18 | Ht 66.0 in | Wt 127.7 lb

## 2016-01-13 DIAGNOSIS — D649 Anemia, unspecified: Secondary | ICD-10-CM

## 2016-01-13 DIAGNOSIS — C180 Malignant neoplasm of cecum: Secondary | ICD-10-CM

## 2016-01-13 DIAGNOSIS — Z5112 Encounter for antineoplastic immunotherapy: Secondary | ICD-10-CM | POA: Diagnosis not present

## 2016-01-13 DIAGNOSIS — N183 Chronic kidney disease, stage 3 (moderate): Secondary | ICD-10-CM

## 2016-01-13 DIAGNOSIS — D638 Anemia in other chronic diseases classified elsewhere: Secondary | ICD-10-CM

## 2016-01-13 DIAGNOSIS — I10 Essential (primary) hypertension: Secondary | ICD-10-CM | POA: Diagnosis not present

## 2016-01-13 DIAGNOSIS — C182 Malignant neoplasm of ascending colon: Secondary | ICD-10-CM

## 2016-01-13 DIAGNOSIS — F0390 Unspecified dementia without behavioral disturbance: Secondary | ICD-10-CM

## 2016-01-13 LAB — COMPREHENSIVE METABOLIC PANEL
ALBUMIN: 4 g/dL (ref 3.5–5.0)
ALK PHOS: 77 U/L (ref 40–150)
ALT: 9 U/L (ref 0–55)
AST: 20 U/L (ref 5–34)
Anion Gap: 13 mEq/L — ABNORMAL HIGH (ref 3–11)
BUN: 14.6 mg/dL (ref 7.0–26.0)
CALCIUM: 9.9 mg/dL (ref 8.4–10.4)
CO2: 23 mEq/L (ref 22–29)
Chloride: 108 mEq/L (ref 98–109)
Creatinine: 1.4 mg/dL — ABNORMAL HIGH (ref 0.6–1.1)
EGFR: 45 mL/min/{1.73_m2} — AB (ref >90)
GLUCOSE: 80 mg/dL (ref 70–140)
POTASSIUM: 3.5 meq/L (ref 3.5–5.1)
SODIUM: 143 meq/L (ref 136–145)
Total Bilirubin: 1.7 mg/dL — ABNORMAL HIGH (ref 0.20–1.20)
Total Protein: 8.1 g/dL (ref 6.4–8.3)

## 2016-01-13 LAB — CBC WITH DIFFERENTIAL/PLATELET
BASO%: 1 % (ref 0.0–2.0)
Basophils Absolute: 0.1 10*3/uL (ref 0.0–0.1)
EOS ABS: 0.3 10*3/uL (ref 0.0–0.5)
EOS%: 4.2 % (ref 0.0–7.0)
HCT: 36.7 % (ref 34.8–46.6)
HGB: 12.2 g/dL (ref 11.6–15.9)
LYMPH%: 19.1 % (ref 14.0–49.7)
MCH: 31 pg (ref 25.1–34.0)
MCHC: 33.3 g/dL (ref 31.5–36.0)
MCV: 92.9 fL (ref 79.5–101.0)
MONO#: 0.6 10*3/uL (ref 0.1–0.9)
MONO%: 7 % (ref 0.0–14.0)
NEUT%: 68.7 % (ref 38.4–76.8)
NEUTROS ABS: 5.6 10*3/uL (ref 1.5–6.5)
PLATELETS: 245 10*3/uL (ref 145–400)
RBC: 3.95 10*6/uL (ref 3.70–5.45)
RDW: 24.3 % — ABNORMAL HIGH (ref 11.2–14.5)
WBC: 8.1 10*3/uL (ref 3.9–10.3)
lymph#: 1.5 10*3/uL (ref 0.9–3.3)

## 2016-01-13 LAB — CEA (IN HOUSE-CHCC): CEA (CHCC-IN HOUSE): 8.57 ng/mL — AB (ref 0.00–5.00)

## 2016-01-13 MED ORDER — SODIUM CHLORIDE 0.9 % IV SOLN
7.8000 mg/kg | Freq: Once | INTRAVENOUS | Status: AC
  Administered 2016-01-13: 450 mg via INTRAVENOUS
  Filled 2016-01-13: qty 16

## 2016-01-13 MED ORDER — SODIUM CHLORIDE 0.9 % IV SOLN
Freq: Once | INTRAVENOUS | Status: AC
  Administered 2016-01-13: 14:00:00 via INTRAVENOUS

## 2016-01-13 NOTE — Patient Instructions (Signed)
[facility] Discharge Instructions for Patients Receiving Chemotherapy  Today you received the following chemotherapy agents Avastin   To help prevent nausea and vomiting after your treatment, we encourage you to take your nausea medication as directed.    If you develop nausea and vomiting that is not controlled by your nausea medication, call the clinic.   BELOW ARE SYMPTOMS THAT SHOULD BE REPORTED IMMEDIATELY:  *FEVER GREATER THAN 100.5 F  *CHILLS WITH OR WITHOUT FEVER  NAUSEA AND VOMITING THAT IS NOT CONTROLLED WITH YOUR NAUSEA MEDICATION  *UNUSUAL SHORTNESS OF BREATH  *UNUSUAL BRUISING OR BLEEDING  TENDERNESS IN MOUTH AND THROAT WITH OR WITHOUT PRESENCE OF ULCERS  *URINARY PROBLEMS  *BOWEL PROBLEMS  UNUSUAL RASH Items with * indicate a potential emergency and should be followed up as soon as possible.  Feel free to call the clinic you have any questions or concerns. The clinic phone number is [phone].  Please show the CHEMO ALERT CARD at check-in to the Emergency Department and triage nurse.   

## 2016-01-13 NOTE — Telephone Encounter (Signed)
Appointments scheduled per 11/8 LOS. Patient given AVS report and calendars of future scheduled appointments. °

## 2016-01-13 NOTE — Progress Notes (Signed)
[place]  Telephone:([phone]) [phone] Fax:(336) [phone]  Clinic Follow Up Note   Patient Care Team: [name], MD as PCP - General (Family Medicine) [name], MD as Consulting Physician (Medical Oncology) [name], MD as Consulting Physician (General Surgery) [name], MD as Consulting Physician (Orthopedic Surgery) Cleon Gustin, MD as Consulting Physician (Urology) 01/13/2016     CHIEF COMPLAINTS:  Follow up metastatic colon cancer  Oncology History   Cancer of right colon [facility])   Staging form: Colon and Rectum, AJCC 7th Edition     Pathologic stage from 01/23/2015: Stage IIC (T4b, N0, cM0) - Signed by [name], MD on 02/05/2015       Cancer of right colon ([place])   01/21/2015 Imaging     CT abdomen and pelvis showed high-grade small bowel obstruction,  right I [place] ureteral nephrosis,  multiple liver cysts.  CT chest was negative for metastatic lesion.      01/23/2015 Pathology Results     invasive adenocarcinoma extending into the pericecal connective tissue,  grade 2, LVI (-),  no perineural invasion, 3 lymph nodes were negative.      01/23/2015 Surgery      terminal ileum and cecum seegmental resection with anastomosis by Dr. Hassell Done      01/23/2015 Initial Diagnosis    Cancer of right colon ([place])      03/13/2015 - 07/06/2015 Chemotherapy    Xeloda 1500 mg bid X 14 days on-7 off, s/p 5 cycles, stopped early due to wrosening renal function      10/28/2015 Progression    PET scan showed multiple hypermetabolic peritoneal nodules, right ovarian cystic mass, and hypermetabolic liver lesion, highly suspicious for metastatic colon cancer. Small pulmonary nodules are indeterminate.       11/11/2015 -  Chemotherapy    Xeloda '1500mg'$  bid, 2 weeks on and one week off, Avastin was added on from cycle 3        HISTORY OF PRESENTING ILLNESS (02/05/2015):  Beth Wilkins 75 y.o. female  with past medical history of moderate dementia,  hypertension, is here because of recently diagnosed stage II colon cancer. She is accompanied to the clinic by her daughter. History of manic is from the chart and her daughter.  She has had abdominal discomfort, nausea and vomiting, and norexia for  the past to 3 months along with 15 pounds weight loss, . Her nausea was mild and intermittent, she does not seek medical attention initially. She presented with  worsening symptoms for 2-3 weeks, and episodes of projectile vomiting and dehydration to emergency room, was admitted to [facility] on 01/20/2015. CT scan reviewed small bowel obstruction, and possible appendiceal rupture. She was brought to OR on 01/23/2015, underwent right hemicolectomy with anastomosis by Dr. Hassell Done. Surgical path reviewed adenocarcinoma at the cecum. She was discharged home on 01/26/2015.  She has recovered well, eating much better than prior to surgery, move around without restriction. No pain, she has loose BM 3 times a day, and she has backed off her mirealax.  she lives with her daughter. She states her appetite is moderate, sometime low, and she refuses to eat if she doesn't have appetite. No nausea, vomiting, abdominal bloating since her surgery.  Her last colonoscopy was 6-8 years ago, and her stool OB was negative this year.   CURRENT THERAPY: Xeloda '1500mg'$  bid, 2 weeks on and one week off, started on 11/11/2015  INTERIM HISTORY: [name] returns for follow-up. She is accompanied by her  daughter to clinic today. She tolerated Avastin infusion 2 weeks ago very well, no noticeable side effects. She has been doing well with Xeloda also, no significant nausea, diarrhea, skin rash, or other noticeable side effects. Her energy level and appetite has been stable, and overall moderate, she is able to function well at home. She denies any pain, or other symptoms.  MEDICAL HISTORY:  Past Medical History:  Diagnosis Date  . Arthritis   . Bilateral renal cysts   . Centrilobular  emphysema ([place])   . Chronic low back pain   . CKD (chronic kidney disease), stage III   . Colon cancer [facility]) oncologist-  dr [name]   dx 01-23-2015  right colon Invasive adenocarcinoma into pericecal connective tissue and involving small intestine , Stage IIc, Grade 2, LVI(-),  (pT4b N0 M0) /  s/p resection termial ileum and cecum and chemotherapy 01-06-217 to 07-06-2015/   08/ 2017  per PET  recurrent w/ liver mets and peritoneal carcinomatosis  . Complication of anesthesia    can be combative due to dementia  . Dementia    MODERATE  . Full dentures   . History of gastroesophageal reflux (GERD)    changes diet  . History of pelvic fracture   . Hydronephrosis, right   . Hyperlipidemia   . Hypertension   . Liver metastasis ([place])   . Pulmonary nodules   . Right ovarian cyst     SURGICAL HISTORY: Past Surgical History:  Procedure Laterality Date  . CATARACT EXTRACTION W/ INTRAOCULAR LENS  IMPLANT, BILATERAL  2005 approx  . CYSTOSCOPY W/ URETERAL STENT PLACEMENT Right 12/03/2015   Procedure: CYSTOSCOPY WITH RETROGRADE PYELOGRAM/URETERAL STENT PLACEMENT;  Surgeon: Cleon Gustin, MD;  Location: [facility];  Service: Urology;  Laterality: Right;  . CYSTOSCOPY WITH RETROGRADE PYELOGRAM, URETEROSCOPY AND STENT PLACEMENT Right 09/21/2015   Procedure: CYSTOSCOPY WITH BIL RETROGRADE PYELOGRAM, URETEROSCOPY, RIGHT STENT PLACEMENT ;  Surgeon: Cleon Gustin, MD;  Location: [facility];  Service: Urology;  Laterality: Right;  . LAPAROSCOPY N/A 01/23/2015   Procedure: DIAGNOSTIC LAPAROSCOPY, EXPLORATORY LAPAROTOMY  RIGHT HEMI-COLECTOMY FOR OBSTRUCTION OF RIGHT TERMINAL ILEUM;  Surgeon: [name], MD;  Location: [facility];  Service: General;  Laterality: N/A;    SOCIAL HISTORY: Social History   Social History  . Marital status: Single    Spouse name: N/A  . Number of children: N/A  . Years of education: N/A   Occupational History  . Not on file.    Social History Main Topics  . Smoking status: Former Smoker    Packs/day: 0.25    Years: 50.00    Types: Cigarettes    Quit date: 01/25/2015  . Smokeless tobacco: Never Used  . Alcohol use 1.8 oz/week    2 Glasses of wine, 1 Cans of beer per week     Comment: occasional   . Drug use: No  . Sexual activity: Not on file   Other Topics Concern  . Not on file   Social History Narrative   Legally separated   Lives w/daughter, Beth Wilkins   Has total of #2 daughters and #1 son   Dementia   Fall Risk-ambulates w/cane    FAMILY HISTORY: Family History  Problem Relation Age of Onset  . Diabetes Mellitus II Father   . Cancer Mother 1    ovarian cancer   . Cervical cancer Other     ALLERGIES:  is allergic to namenda [memantine hcl] and oxycodone.  MEDICATIONS:  Current Outpatient Prescriptions  Medication Sig Dispense Refill  . amLODipine (NORVASC) 5 MG tablet Take 5 mg by mouth every morning.     Marland Kitchen antiseptic oral rinse (BIOTENE) LIQD 15 mLs by Mouth Rinse route daily. BIOTENE CLINICAL    . capecitabine (XELODA) 500 MG tablet Take 3 tablets (1,500 mg total) by mouth 2 (two) times daily after a meal. 126 tablet 0  . Cholecalciferol (VITAMIN D3) 2000 UNITS TABS Take by mouth daily.    . Coenzyme Q10 (CO Q 10) 100 MG CAPS Take by mouth daily.    Marland Kitchen donepezil (ARICEPT) 10 MG tablet Take 1 tab daily (Patient taking differently: Take 10 mg by mouth every evening. Take 1 tab daily) 30 tablet 11  . Multiple Vitamins-Minerals (WOMENS MULTIVITAMIN PO) Take 1 tablet by mouth daily.    . polyethylene glycol (MIRALAX / GLYCOLAX) packet Take 17 g by mouth daily. (Patient taking differently: Take 17 g by mouth daily as needed. ) 14 each 0  . simvastatin (ZOCOR) 20 MG tablet Take 20 mg by mouth daily at 6 PM.     No current facility-administered medications for this visit.     REVIEW OF SYSTEMS:   Constitutional: Denies fevers, chills or abnormal night sweats Eyes: Denies  blurriness of vision, double vision or watery eyes Ears, nose, mouth, throat, and face: Denies mucositis or sore throat Respiratory: Denies cough, dyspnea or wheezes Cardiovascular: Denies palpitation, chest discomfort or lower extremity swelling Gastrointestinal:  Denies nausea, heartburn or change in bowel habits Skin: Denies abnormal skin rashes Lymphatics: Denies new lymphadenopathy or easy bruising Neurological:Denies numbness, tingling or new weaknesses Behavioral/Psych: Mood is stable, no new changes  All other systems were reviewed with the patient and are negative.  PHYSICAL EXAMINATION: ECOG PERFORMANCE STATUS: 1 - Symptomatic but completely ambulatory  Vitals:   01/13/16 1330  BP: (!) 169/85  Pulse: 90  Resp: 18  Temp: 97.7 F (36.5 C)   Filed Weights   01/13/16 1330  Weight: 127 lb 11.2 oz (57.9 kg)    GENERAL:alert, no distress and comfortable SKIN: skin color, texture, turgor are normal, no rashes or significant lesions EYES: normal, conjunctiva are pink and non-injected, sclera clear OROPHARYNX:no exudate, no erythema and lips, buccal mucosa, and tongue normal  NECK: supple, thyroid normal size, non-tender, without nodularity LYMPH:  no palpable lymphadenopathy in the cervical, axillary or inguinal LUNGS: clear to auscultation and percussion with normal breathing effort HEART: regular rate & rhythm and no murmurs and no lower extremity edema ABDOMEN:abdomen soft, non-tender and normal bowel sounds Musculoskeletal:no cyanosis of digits and no clubbing  PSYCH: alert & oriented x 3 with fluent speech NEURO: no focal motor/sensory deficits  LABORATORY DATA:  I have reviewed the data as listed CBC Latest Ref Rng & Units 01/13/2016 12/23/2015 12/02/2015  WBC 3.9 - 10.3 10e3/uL 8.1 7.6 6.1  Hemoglobin 11.6 - 15.9 g/dL 12.2 11.9 12.2  Hematocrit 34.8 - 46.6 % 36.7 34.3(L) 37.0  Platelets 145 - 400 10e3/uL 245 231 279    CMP Latest Ref Rng & Units 01/13/2016  12/23/2015 12/02/2015  Glucose 70 - 140 mg/dl 80 97 78  BUN 7.0 - 26.0 mg/dL 14.6 12.8 15.3  Creatinine 0.6 - 1.1 mg/dL 1.4(H) 1.3(H) 1.4(H)  Sodium 136 - 145 mEq/L 143 143 140  Potassium 3.5 - 5.1 mEq/L 3.5 3.6 3.8  Chloride 101 - 111 mmol/L - - -  CO2 22 - 29 mEq/L '23 26 25  '$ Calcium 8.4 - 10.4 mg/dL  9.9 9.7 9.3  Total Protein 6.4 - 8.3 g/dL 8.1 7.6 7.5  Total Bilirubin 0.20 - 1.20 mg/dL 1.70(H) 1.80(H) 1.38(H)  Alkaline Phos 40 - 150 U/L 77 74 71  AST 5 - 34 U/L '20 20 17  '$ ALT 0 - 55 U/L 9 8 <9   CEA  01/23/2015: 5.7 06/05/2015: 6.1 08/04/2015: 10.3 10/12/2015: 13.29 (in-house)  11/04/2015: 12.83 01/13/2016: 8.57  PATHOLOGY REPORT: Diagnosis 01/23/2015 Colon, segmental resection, with terminal ileum and cecum - INVASIVE ADENOCARCINOMA EXTENDING INTO PERICECAL CONNECTIVE TISSUE AND INVOLVING SMALL INTESTINE. - THREE BENIGN LYMPH NODES (0/3). - SEE ONCOLOGY TABLE BELOW.  Microscopic Comment COLON AND RECTUM (INCLUDING TRANS-ANAL RESECTION): Specimen: Terminal ileum and cecum. Procedure: Resection. Tumor site: Cecum adjacent to appendix. Specimen integrity: Intact. Macroscopic intactness of mesorectum: Not applicable. Macroscopic tumor perforation: No. Invasive tumor: Maximum size: 4.0 cm. Histologic type(s): Colorectal adenocarcinoma. Histologic grade and differentiation: G2: moderately differentiated/low grade. Type of polyp in which invasive carcinoma arose: No residual polyp. Microscopic extension of invasive tumor: Into pericecal connective tissue and terminal ileum. Lymph-Vascular invasion: Not identified. Peri-neural invasion: Not identified. Tumor deposit(s) (discontinuous extramural extension): No. Resection margins: Proximal margin: Free of tumor. Distal margin: Free of tumor. Circumferential (radial) (posterior ascending, posterior descending; lateral and posterior mid-rectum; and entire lower 1/3 rectum): Free of tumor. Mesenteric margin (sigmoid and  transverse): N/A. Distance closest margin (if all above margins negative): N/A. Treatment effect (neo-adjuvant therapy): No. Additional polyp(s): No. Non-neoplastic findings: N/A. Lymph nodes: number examined 3; number positive: 0. Pathologic Staging: pT4b, pN0, pMX. Ancillary studies: Mismatch repair protein by immunohistochemistry pending. Comments: There is a colorectal-type adenocarcinoma arising in the cecum in the area of the appendiceal orifice. The tumor extends into the pericecal connective tissue, and involves the attached segment of terminal ileum. Immunohistochemistry shows the tumor is positive with CDX2 and cytokeratin 20, and negative with cytokeratin 7, estrogen receptor and WT-1 supporting a diagnosis of primary colorectal adenocarcinoma. ADDITIONAL INFORMATION: Mismatch Repair (MMR) Protein Immunohistochemistry (IHC) IHC Expression Result: MLH1: Preserved nuclear expression (greater 50% tumor expression) MSH2: Preserved nuclear expression (greater 50% tumor expression) MSH6: Preserved nuclear expression (greater 50% tumor expression) PMS2: Preserved nuclear expression (greater 50% tumor expression) * Internal control demonstrates intact nuclear expression Interpretation: NORMAL There is preserved expression of the major and minor MMR proteins. There is a very low probability that microsatellite instability (MSI) is present. However, certain clinically significant MMR protein mutations may result in preservation of nuclear expression. It is recommended that the preservation of protein expression be correlated with molecular based MSI testing.    RADIOGRAPHIC STUDIES: I have personally reviewed the radiological images as listed and agreed with the findings in the report.  Ct chest, Abdomen Pelvis Wo Contrast 01/08/2016  IMPRESSION: 1. 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 2. Hepatic and renal  cysts. 3.  Aortoiliac atherosclerotic vascular disease. 4. Lumbar spondylosis and degenerative disc disease with multilevel impingement.   ASSESSMENT & PLAN: 75 year old African-American female, with past medical history of moderate dementia, independent with ADLs, hypertension, presents with small bowel obstruction and weight loss.  1. 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 -I personally reviewed her recent PET scan images, unfortunately the  scan showed hypermetabolic peritoneal and liver metastases. Given her elevated CEA, normal CA125, this is most consistent with recurrent colon cancer -We discussed is that her prostatic colon cancer is not curable at this stage, and the treatment goal is palliative. -She has started Xeloda '1500mg'$  bid, 2 weeks on and one week off. -I reviewed her [facility] one genomic testing as always patient and her daughter, her tumor has K-ras mutation, she is not a candidate for EGFR inhibitor. The tumor has MSI stable, she is not a candidate for immunotherapy, although she may be a candidate for clinical trial of immunotherapy plus MEK inhibitor at [facility]. -she is tolerating Xeloda and Avastin well -I reviewed her restaging CT scan from 02/04/2016, which showed overall stable disease, no new lesions. We'll continue her chemotherapy. -lab results reviewed, she has mildly elevated bilirubin, overall stable, we'll continue monitoring.  2. Anemia -mild, stable, we'll continue monitoring  3. CKD stage III, right hydroureter -Her Cr was around 1 in 08/2013, Cr increased to 2.4 on 01/20/2015 when she was diagnosed with colon cancer due to dehydration, improved some and has been around  1.2-1.6 lately.  -Her recent CT scan showed right hydroureter, which probably contributes to her renal insufficiency. -she had ureter stent placement again in 12/2015  4. HTN, dementia -She will continue her medication and follow-up with her primary care physician.  5. Hyperbilirubinemia -probably related to liver metastasis, vs Xeloda  -We'll continue monitoring closely. It's been stable overall   Plan -lab reviewed, will proceed cycle 4 Xeloda and continue Avastin 7.'5mg'$ /kg every 3 weeks today  -I will see her back in 3 weeks before chemo   All questions were answered. The patient knows to call the clinic with any problems, questions or concerns.  I spent 20 minutes counseling the patient face to face. The total time spent in the appointment was 25 minutes and more than 50% was on counseling.     [name], MD 01/13/2016

## 2016-01-13 NOTE — Telephone Encounter (Signed)
Per LOS I have scheduled appts and notified the scheduler 

## 2016-01-21 ENCOUNTER — Telehealth: Payer: Self-pay | Admitting: Neurology

## 2016-01-21 NOTE — Telephone Encounter (Signed)
Left VM for patient's daughter that this would be taken into consideration for mother's OV . Advise she call back with any other questions or concerns.

## 2016-01-21 NOTE — Telephone Encounter (Signed)
That is fine, we will take that into consideration during her visit. Thanks

## 2016-01-21 NOTE — Telephone Encounter (Signed)
[facility] Apr 25, 2040. She has an appointment on 01/25/16 at 2:00 for a 1 year follow up. Her daughter Beth Wilkins called and lmom that her mom is taking Chemo and that her Oncologist said that she may have "Chemo Brain" and be confused and tired that day at her appointment. She wanted to give you a heads up just in case she should reschedule the appointment. Beth Wilkins's # is [phone]. Thank you

## 2016-01-21 NOTE — Telephone Encounter (Signed)
Patient's daughter called back to verify she should keep mother's appointment.

## 2016-01-21 NOTE — Telephone Encounter (Signed)
Please advise 

## 2016-01-25 ENCOUNTER — Ambulatory Visit (INDEPENDENT_AMBULATORY_CARE_PROVIDER_SITE_OTHER): Payer: Medicare Other | Admitting: Neurology

## 2016-01-25 ENCOUNTER — Encounter: Payer: Self-pay | Admitting: Neurology

## 2016-01-25 VITALS — BP 152/80 | HR 87 | Ht 66.0 in | Wt 127.2 lb

## 2016-01-25 DIAGNOSIS — F0390 Unspecified dementia without behavioral disturbance: Secondary | ICD-10-CM | POA: Diagnosis not present

## 2016-01-25 DIAGNOSIS — F03B0 Unspecified dementia, moderate, without behavioral disturbance, psychotic disturbance, mood disturbance, and anxiety: Secondary | ICD-10-CM

## 2016-01-25 NOTE — Progress Notes (Signed)
NEUROLOGY FOLLOW UP OFFICE NOTE  AUGUSTA KRAFT LY:[id number]  HISTORY OF PRESENT ILLNESS: I had the pleasure of seeing Beth Wilkins in follow-up in the neurology clinic on 01/25/2016. 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 MMSE in June 2016 was 13/30. (11/30 in December 2015). She is again accompanied by her daughter who helps supplement the history today. Since her last visit, she has been diagnosed with metastatic colon cancer and underwent surgery and chemotherapy. She gets Avastin infusion every 3 weeks and Xeloda after. When asked about her memory, she states "I don't think about it." She lives with her daughter, who reports her mother has been stable. She can dress herself but needs help in and out of the tub. Her daughter is in charge of bills and her medications. She reports frontal headaches since she started infusion treatments. She would either take an aspirin or Tylenol. She denies any paresthesias or hand weakness, but her daughter notices she shakes her hands a lot. When she gets nervous, she would report getting overheated or "loses her voice." She is noted to do this in the office today. Her daughter reports she does better with the home visiting physician and can do flash cards with her daughter. They deny any recent falls.   HPI: This is a 75 yo RH woman with a history of hypertension and hyperlipidemia with memory loss that her daughter started to notice in September 2013. Her daughter is a Catering manager and was away for 2 months in 2013, when she got home in September 2013, their house was "ridiculously hot" over 90 degrees. Her mother did not seem concerned about it and stated the [facility] was not working, however her daughter became concerned that this was not addressed by the patient. She had fallen in 01/2013 and sustained a pelvic fracture. Since then, her  daughter started noticing short-term memory problems, asking repeated questions, with focusing difficulties, worse with stress or when people are arguing around her, making her agitated. The patient reports that she would think of something then get tangled, taking a while to get her thoughts back. Her daughter has noticed that she can spell words but sometimes would not recognize them (she could not recognize if it was December or January written down). She reports that with prescription glasses, she was able to put words together. Her daughter is concerned that she has to practice writing a check beforehand, however the patient reports that she has always done this. She has only missed paying one bill, her daughter reports she has been very good with this. She occasionally forgets to take her medications and uses a pillbox. Her family has not allowed her to drive anymore.  Diagnostic Data: MRI brain does not show any evidence of focal lesions such as mass or infarct. There is moderate generalized atrophy that appears more significant over the left temporal and occipital lobes, however images are slightly tilted.   PAST MEDICAL HISTORY: Past Medical History:  Diagnosis Date  . Arthritis   . Bilateral renal cysts   . Centrilobular emphysema ([place])   . Chronic low back pain   . CKD (chronic kidney disease), stage III   . 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 Complication of anesthesia    can be combative due to dementia  . Dementia    MODERATE  . Full dentures   . History of gastroesophageal reflux (GERD)    changes diet  . History of pelvic fracture   . Hydronephrosis,  right   . Hyperlipidemia   . Hypertension   . Liver metastasis ([place])   . Pulmonary nodules   . Right ovarian cyst     MEDICATIONS: Current Outpatient Prescriptions on File Prior to Visit  Medication Sig Dispense Refill  . amLODipine (NORVASC) 5 MG tablet Take 5 mg by mouth every morning.     Marland Kitchen antiseptic oral rinse (BIOTENE) LIQD 15 mLs by Mouth Rinse route daily. BIOTENE CLINICAL    . capecitabine (XELODA) 500 MG tablet Take 3 tablets (1,500 mg total) by mouth 2 (two) times daily after a meal. 126 tablet 0  . Cholecalciferol (VITAMIN D3) 2000 UNITS TABS Take by mouth daily.    . Coenzyme Q10 (CO Q 10) 100 MG CAPS Take by mouth daily.    Marland Kitchen donepezil (ARICEPT) 10 MG tablet Take 1 tab daily (Patient taking differently: Take 10 mg by mouth every evening. Take 1 tab daily) 30 tablet 11  . Multiple Vitamins-Minerals (WOMENS MULTIVITAMIN PO) Take 1 tablet by mouth daily.    . polyethylene glycol (MIRALAX / GLYCOLAX) packet Take 17 g by mouth daily. (Patient taking differently: Take 17 g by mouth daily as needed. ) 14 each 0  . simvastatin (ZOCOR) 20 MG tablet Take 20 mg by mouth daily at 6 PM.     No current facility-administered medications on file prior to visit.     ALLERGIES: Allergies  Allergen Reactions  . Namenda [Memantine Hcl] Other (See Comments)    hallucinations  . Oxycodone Other (See Comments)    Hallucinations     FAMILY HISTORY: Family History  Problem Relation Age of Onset  . Diabetes Mellitus II Father   . Cancer Mother 58    ovarian cancer   . Cervical cancer Other     SOCIAL HISTORY: Social History   Social History  . Marital status: Single    Spouse name: N/A  . Number of children: N/A  . Years of education: N/A   Occupational History  . Not on file.   Social History Main Topics  . Smoking status: Former Smoker    Packs/day: 0.25    Years: 50.00    Types: Cigarettes    Quit date: 01/25/2015  . Smokeless tobacco: Never Used  . Alcohol use  1.8 oz/week    2 Glasses of wine, 1 Cans of beer per week     Comment: occasional   . Drug use: No  . Sexual activity: Not on file   Other Topics Concern  . Not on file   Social History Narrative   Legally separated   Lives w/daughter, Debroah Ashman   Has total of #2 daughters and #1 son   Dementia   Fall Risk-ambulates w/cane    REVIEW OF SYSTEMS: Constitutional: No fevers, chills, or sweats, no generalized fatigue, change in appetite Eyes: No visual changes, double vision, eye pain Ear, nose and throat: No hearing loss, ear pain, nasal congestion, sore throat Cardiovascular: No chest pain, palpitations Respiratory:  No shortness of breath at rest or with exertion, wheezes GastrointestinaI:  No nausea, vomiting, diarrhea, abdominal pain, fecal incontinence Genitourinary:  No dysuria, urinary retention or frequency Musculoskeletal:  No neck pain, back pain Integumentary: No rash, pruritus, skin lesions Neurological: as above Psychiatric: No depression, insomnia, anxiety Endocrine: No palpitations, fatigue, diaphoresis, mood swings, change in appetite, change in weight, increased thirst Hematologic/Lymphatic:  No anemia, purpura, petechiae. Allergic/Immunologic: no itchy/runny eyes, nasal congestion, recent allergic reactions, rashes  PHYSICAL EXAM: Vitals:   01/25/16 1412  BP: (!) 152/80  Pulse: 87   General: No acute distress Head:  Normocephalic/atraumatic Neck: supple, no paraspinal tenderness, full range of motion Heart:  Regular rate and rhythm Lungs:  Clear to auscultation bilaterally Back: No paraspinal tenderness Skin/Extremities: No rash, no edema Neurological Exam: alert and oriented to person, city/state. No aphasia or dysarthria. Fund of knowledge is appropriate. Remote and recent memory impaired. Attention reduced. Able to name "pen" but could not name "button," able to repeat phrases. She is unable to read, she would say each letter in a word but unable  to say the word (similar to prior). She started having dry heaves after asked to read and draw clock.   MMSE - Mini Mental State Exam 01/25/2016 09/02/2014 03/03/2014  Orientation to time 0 0 0  Orientation to Place 2 5 3   Registration 2 3 3   Attention/ Calculation 0 0 0  Recall 1 0 0  Language- name 2 objects 1 2 2   Language- repeat 1 1 1   Language- follow 3 step command 2 2 1   Language- read & follow direction 0 0 0  Write a sentence 0 0 1  Copy design 0 0 0  Total score 9 13 11    Cranial nerves: Pupils equal, round, reactive to light. Extraocular movements intact with no nystagmus. Visual fields full. Facial sensation intact. No facial asymmetry. Tongue, uvula, palate midline. Motor: Bulk and tone normal, muscle strength 5/5 throughout with no pronator drift. Sensation to light touch. No extinction to double simultaneous stimulation. Deep tendon reflexes 2+ throughout, toes downgoing. Finger to nose testing intact. Gait narrow-based and steady, able to tandem walk adequately. Romberg negative.  IMPRESSION: This is a 75 yo RH woman with a history of hypertension and hyperlipidemia, with memory loss and visual deficits (left neglect) and alexia, MMSE today 9/30 (13/30 in June 2016, 11/30 in December 2015, 19/30 in June 2015). 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 She did not tolerate addition of Namenda. Continue Aricept 10mg  daily. We discussed the anxiety noted in the office today, with patient starting to have dry heaving during MMSE testing. Her daughter will continue to monitor mood, we discussed potentially starting an SSRI if needed. We again discussed physical exercise and brain stimulation exercises for brain health. We again discussed the importance of long-term planning, home safety, 24/7 care. She is not driving. She will follow-up in 1 year or earlier if needed.   Thank you  for allowing me to participate in her care.  Please do not hesitate to call for any questions or concerns.  The duration of this appointment visit was 25 minutes of face-to-face time with the patient.  Greater than 50% of this time was spent in counseling, explanation of diagnosis, planning of further management, and coordination of care.   Ellouise Newer, M.D.   CC: Dr. Drema Dallas

## 2016-01-25 NOTE — Patient Instructions (Signed)
1. Continue all your medications 2. Continue to monitor mood and anxiety level, we can start medication if needed 3. Follow-up in 1 year, call for any changes

## 2016-02-01 ENCOUNTER — Telehealth: Payer: Self-pay | Admitting: Neurology

## 2016-02-01 NOTE — Telephone Encounter (Signed)
Patient daughter Beth Wilkins called and wants to talk to someone about her mother. This weekend she states her mother told someone that she is ready to die and if they would help end her life. They refused of course. She states that her mother was in the restroom alittle to long so she went to check on her and she washing her hands in the toilet with a Bowl movement in it. She thinks it is time for the depression medication  Please call her at [phone] you can leave her a message and she will call you back if the cant answer

## 2016-02-01 NOTE — Telephone Encounter (Signed)
Please advise 

## 2016-02-01 NOTE — Telephone Encounter (Signed)
Let's start Lexapro 10mg  daily, pls send Rx #30 with 6 refills. Pls let her know some people may feel dizzy or a little nausea when first starting medication, this usually improves. Since she is also talking about death this way, would recommend she start seeing a psychiatrist, pls send referral if daughter is agreeable. Thanks

## 2016-02-02 ENCOUNTER — Other Ambulatory Visit: Payer: Self-pay

## 2016-02-02 MED ORDER — ESCITALOPRAM OXALATE 10 MG PO TABS: 10.0000 mg | ORAL_TABLET | Freq: Every day | ORAL | 6 refills | Status: DC

## 2016-02-02 NOTE — Telephone Encounter (Signed)
Patient's daughter states to send Lexapro to [facility] on [address].   RX sent.

## 2016-02-02 NOTE — Telephone Encounter (Signed)
Contacted patient's daughter Lorriane Shire and she wants referral sent to psychiatry. Wants rx for Lexapro sent to pharmacy.  LVM to verify which pharmacy Lexapro needs sent to.

## 2016-02-03 ENCOUNTER — Encounter: Payer: Self-pay | Admitting: Neurology

## 2016-02-04 ENCOUNTER — Other Ambulatory Visit (HOSPITAL_BASED_OUTPATIENT_CLINIC_OR_DEPARTMENT_OTHER): Payer: Medicare Other

## 2016-02-04 ENCOUNTER — Ambulatory Visit (HOSPITAL_BASED_OUTPATIENT_CLINIC_OR_DEPARTMENT_OTHER): Payer: Medicare Other

## 2016-02-04 ENCOUNTER — Ambulatory Visit (HOSPITAL_BASED_OUTPATIENT_CLINIC_OR_DEPARTMENT_OTHER): Payer: Medicare Other | Admitting: Hematology

## 2016-02-04 ENCOUNTER — Encounter: Payer: Self-pay | Admitting: Hematology

## 2016-02-04 VITALS — BP 157/82 | HR 67

## 2016-02-04 VITALS — BP 140/77 | HR 71 | Temp 97.7°F | Resp 17 | Ht 66.0 in | Wt 125.4 lb

## 2016-02-04 DIAGNOSIS — C180 Malignant neoplasm of cecum: Secondary | ICD-10-CM

## 2016-02-04 DIAGNOSIS — Z5112 Encounter for antineoplastic immunotherapy: Secondary | ICD-10-CM | POA: Diagnosis not present

## 2016-02-04 DIAGNOSIS — I10 Essential (primary) hypertension: Secondary | ICD-10-CM

## 2016-02-04 DIAGNOSIS — C182 Malignant neoplasm of ascending colon: Secondary | ICD-10-CM

## 2016-02-04 DIAGNOSIS — F0390 Unspecified dementia without behavioral disturbance: Secondary | ICD-10-CM | POA: Diagnosis not present

## 2016-02-04 DIAGNOSIS — N183 Chronic kidney disease, stage 3 (moderate): Secondary | ICD-10-CM | POA: Diagnosis not present

## 2016-02-04 DIAGNOSIS — D649 Anemia, unspecified: Secondary | ICD-10-CM | POA: Diagnosis not present

## 2016-02-04 DIAGNOSIS — D638 Anemia in other chronic diseases classified elsewhere: Secondary | ICD-10-CM

## 2016-02-04 LAB — COMPREHENSIVE METABOLIC PANEL
ALBUMIN: 3.8 g/dL (ref 3.5–5.0)
ALT: 9 U/L (ref 0–55)
AST: 20 U/L (ref 5–34)
Alkaline Phosphatase: 66 U/L (ref 40–150)
Anion Gap: 12 mEq/L — ABNORMAL HIGH (ref 3–11)
BUN: 20.1 mg/dL (ref 7.0–26.0)
CHLORIDE: 108 meq/L (ref 98–109)
CO2: 22 meq/L (ref 22–29)
Calcium: 9.5 mg/dL (ref 8.4–10.4)
Creatinine: 1.4 mg/dL — ABNORMAL HIGH (ref 0.6–1.1)
EGFR: 42 mL/min/{1.73_m2} — AB (ref >90)
GLUCOSE: 86 mg/dL (ref 70–140)
POTASSIUM: 3.7 meq/L (ref 3.5–5.1)
SODIUM: 142 meq/L (ref 136–145)
Total Bilirubin: 2 mg/dL — ABNORMAL HIGH (ref 0.20–1.20)
Total Protein: 7.7 g/dL (ref 6.4–8.3)

## 2016-02-04 LAB — CBC WITH DIFFERENTIAL/PLATELET
BASO%: 0.9 % (ref 0.0–2.0)
Basophils Absolute: 0.1 10*3/uL (ref 0.0–0.1)
EOS ABS: 0.3 10*3/uL (ref 0.0–0.5)
EOS%: 3.8 % (ref 0.0–7.0)
HEMATOCRIT: 37.3 % (ref 34.8–46.6)
HGB: 12.5 g/dL (ref 11.6–15.9)
LYMPH#: 1.4 10*3/uL (ref 0.9–3.3)
LYMPH%: 16.3 % (ref 14.0–49.7)
MCH: 32.1 pg (ref 25.1–34.0)
MCHC: 33.6 g/dL (ref 31.5–36.0)
MCV: 95.7 fL (ref 79.5–101.0)
MONO#: 0.5 10*3/uL (ref 0.1–0.9)
MONO%: 6.2 % (ref 0.0–14.0)
NEUT%: 72.8 % (ref 38.4–76.8)
NEUTROS ABS: 6.2 10*3/uL (ref 1.5–6.5)
PLATELETS: 223 10*3/uL (ref 145–400)
RBC: 3.9 10*6/uL (ref 3.70–5.45)
RDW: 23.5 % — ABNORMAL HIGH (ref 11.2–14.5)
WBC: 8.6 10*3/uL (ref 3.9–10.3)

## 2016-02-04 MED ORDER — SODIUM CHLORIDE 0.9 % IV SOLN
Freq: Once | INTRAVENOUS | Status: AC
  Administered 2016-02-04: 15:00:00 via INTRAVENOUS

## 2016-02-04 MED ORDER — CAPECITABINE 500 MG PO TABS: 1000.0000 mg/m2 | ORAL_TABLET | Freq: Two times a day (BID) | ORAL | 0 refills | Status: DC

## 2016-02-04 MED ORDER — SODIUM CHLORIDE 0.9 % IV SOLN
7.5000 mg/kg | Freq: Once | INTRAVENOUS | Status: AC
  Administered 2016-02-04: 425 mg via INTRAVENOUS
  Filled 2016-02-04: qty 16

## 2016-02-04 MED FILL — CAPECITABINE 500 MG TABLET: 500 | 12 days supply | Qty: 70 | Fill #0

## 2016-02-04 NOTE — Progress Notes (Signed)
[place]  Telephone:([phone]) [phone] Fax:([phone]) [phone]  Clinic Follow Up Note   Patient Care Team: [name], MD as PCP - General (Family Medicine) [name], MD as Consulting Physician (Medical Oncology) [name], MD as Consulting Physician (General Surgery) [name], MD as Consulting Physician (Orthopedic Surgery) Cleon Gustin, MD as Consulting Physician (Urology) 02/04/2016     CHIEF COMPLAINTS:  Follow up metastatic colon cancer  Oncology History   Cancer of right colon [facility])   Staging form: Colon and Rectum, AJCC 7th Edition     Pathologic stage from 01/23/2015: Stage IIC (T4b, N0, cM0) - Signed by [name], MD on 02/05/2015       Cancer of right colon ([place])   01/21/2015 Imaging     CT abdomen and pelvis showed high-grade small bowel obstruction,  right I [place] ureteral nephrosis,  multiple liver cysts.  CT chest was negative for metastatic lesion.      01/23/2015 Pathology Results     invasive adenocarcinoma extending into the pericecal connective tissue,  grade 2, LVI (-),  no perineural invasion, 3 lymph nodes were negative.      01/23/2015 Surgery      terminal ileum and cecum seegmental resection with anastomosis by Dr. Hassell Done      01/23/2015 Initial Diagnosis    Cancer of right colon ([place])      03/13/2015 - 07/06/2015 Chemotherapy    Xeloda 1500 mg bid X 14 days on-7 off, s/p 5 cycles, stopped early due to wrosening renal function      10/28/2015 Progression    PET scan showed multiple hypermetabolic peritoneal nodules, right ovarian cystic mass, and hypermetabolic liver lesion, highly suspicious for metastatic colon cancer. Small pulmonary nodules are indeterminate.       11/11/2015 -  Chemotherapy    Xeloda '1500mg'$  bid, 2 weeks on and one week off, Avastin was added on from cycle 3        HISTORY OF PRESENTING ILLNESS (02/05/2015):  Beth Wilkins 75 y.o. female  with past medical history of moderate dementia,  hypertension, is here because of recently diagnosed stage II colon cancer. She is accompanied to the clinic by her daughter. History of manic is from the chart and her daughter.  She has had abdominal discomfort, nausea and vomiting, and norexia for  the past to 3 months along with 15 pounds weight loss, . Her nausea was mild and intermittent, she does not seek medical attention initially. She presented with  worsening symptoms for 2-3 weeks, and episodes of projectile vomiting and dehydration to emergency room, was admitted to [facility] on 01/20/2015. CT scan reviewed small bowel obstruction, and possible appendiceal rupture. She was brought to OR on 01/23/2015, underwent right hemicolectomy with anastomosis by Dr. Hassell Done. Surgical path reviewed adenocarcinoma at the cecum. She was discharged home on 01/26/2015.  She has recovered well, eating much better than prior to surgery, move around without restriction. No pain, she has loose BM 3 times a day, and she has backed off her mirealax.  she lives with her daughter. She states her appetite is moderate, sometime low, and she refuses to eat if she doesn't have appetite. No nausea, vomiting, abdominal bloating since her surgery.  Her last colonoscopy was 6-8 years ago, and her stool OB was negative this year.   CURRENT THERAPY: Xeloda '1500mg'$  bid, 2 weeks on and one week off, started on 11/11/2015  INTERIM HISTORY: Beth Wilkins returns for follow-up. She is accompanied by her  daughter to clinic today. She has been tolerating treatment well, no significant side effects. She has been able to drink fluids (water or tea) adequately at home. She followed up with her neurologist lately, Lexapro was prescribed, but she has not started. Lonnette denies depression or suicidal. Her daughter Beth Wilkins has noticed some behavioral issue (urinate on floor etc), but no significant agitation or hallucination.  MEDICAL HISTORY:  Past Medical History:  Diagnosis Date  . Arthritis     . Bilateral renal cysts   . Centrilobular emphysema (HCC)   . Chronic low back pain   . CKD (chronic kidney disease), stage III   . Colon cancer [facility]) oncologist-  dr Malachy Mood   dx 01-23-2015  right colon Invasive adenocarcinoma into pericecal connective tissue and involving small intestine , Stage IIc, Grade 2, LVI(-),  (pT4b N0 M0) /  s/p resection termial ileum and cecum and chemotherapy 01-06-217 to 07-06-2015/   08/ 2017  per PET  recurrent w/ liver mets and peritoneal carcinomatosis  . Complication of anesthesia    can be combative due to dementia  . Dementia    MODERATE  . Full dentures   . History of gastroesophageal reflux (GERD)    changes diet  . History of pelvic fracture   . Hydronephrosis, right   . Hyperlipidemia   . Hypertension   . Liver metastasis (HCC)   . Pulmonary nodules   . Right ovarian cyst     SURGICAL HISTORY: Past Surgical History:  Procedure Laterality Date  . CATARACT EXTRACTION W/ INTRAOCULAR LENS  IMPLANT, BILATERAL  2005 approx  . CYSTOSCOPY W/ URETERAL STENT PLACEMENT Right 12/03/2015   Procedure: CYSTOSCOPY WITH RETROGRADE PYELOGRAM/URETERAL STENT PLACEMENT;  Surgeon: Malen Gauze, MD;  Location[facility];  Service: Urology;  Laterality: Right;  . CYSTOSCOPY WITH RETROGRADE PYELOGRAM, URETEROSCOPY AND STENT PLACEMENT Right 09/21/2015   Procedure: CYSTOSCOPY WITH BIL RETROGRADE PYELOGRAM, URETEROSCOPY, RIGHT STENT PLACEMENT ;  Surgeon: Malen Gauze, MD;  Location[facility];  Service: Urology;  Laterality: Right;  . LAPAROSCOPY N/A 01/23/2015   Procedure: DIAGNOSTIC LAPAROSCOPY, EXPLORATORY LAPAROTOMY  RIGHT HEMI-COLECTOMY FOR OBSTRUCTION OF RIGHT TERMINAL ILEUM;  Surgeon: Luretha Murphy, MD;  Location: [facility];  Service: General;  Laterality: N/A;    SOCIAL HISTORY: Social History   Social History  . Marital status: Single    Spouse name: N/A  . Number of children: N/A  . Years of education: N/A    Occupational History  . Not on file.   Social History Main Topics  . Smoking status: Former Smoker    Packs/day: 0.25    Years: 50.00    Types: Cigarettes    Quit date: 01/25/2015  . Smokeless tobacco: Never Used  . Alcohol use 1.8 oz/week    2 Glasses of wine, 1 Cans of beer per week     Comment: occasional   . Drug use: No  . Sexual activity: Not on file   Other Topics Concern  . Not on file   Social History Narrative   Legally separated   Lives w/daughter, Kaydra Borgen   Has total of #2 daughters and #1 son   Dementia   Fall Risk-ambulates w/cane    FAMILY HISTORY: Family History  Problem Relation Age of Onset  . Diabetes Mellitus II Father   . Cancer Mother 47    ovarian cancer   . Cervical cancer Other     ALLERGIES:  is allergic to [employer]  hcl] and oxycodone.  MEDICATIONS:  Current Outpatient Prescriptions  Medication Sig Dispense Refill  . amLODipine (NORVASC) 5 MG tablet Take 5 mg by mouth every morning.     Marland Kitchen antiseptic oral rinse (BIOTENE) LIQD 15 mLs by Mouth Rinse route daily. BIOTENE CLINICAL    . bevacizumab (AVASTIN) 400 MG/16ML SOLN Inject into the vein once.    . capecitabine (XELODA) 500 MG tablet Take 3 tablets (1,500 mg total) by mouth 2 (two) times daily after a meal. 70 tablet 0  . Cholecalciferol (VITAMIN D3) 2000 UNITS TABS Take by mouth daily.    . Coenzyme Q10 (CO Q 10) 100 MG CAPS Take by mouth daily.    Marland Kitchen donepezil (ARICEPT) 10 MG tablet Take 1 tab daily (Patient taking differently: Take 10 mg by mouth every evening. Take 1 tab daily) 30 tablet 11  . Multiple Vitamins-Minerals (WOMENS MULTIVITAMIN PO) Take 1 tablet by mouth daily.    . polyethylene glycol (MIRALAX / GLYCOLAX) packet Take 17 g by mouth daily. (Patient taking differently: Take 17 g by mouth daily as needed. ) 14 each 0  . simvastatin (ZOCOR) 20 MG tablet Take 20 mg by mouth daily at 6 PM.    . escitalopram (LEXAPRO) 10 MG tablet Take 1 tablet (10 mg  total) by mouth daily. (Patient not taking: Reported on 02/04/2016) 30 tablet 6   No current facility-administered medications for this visit.     REVIEW OF SYSTEMS:   Constitutional: Denies fevers, chills or abnormal night sweats Eyes: Denies blurriness of vision, double vision or watery eyes Ears, nose, mouth, throat, and face: Denies mucositis or sore throat Respiratory: Denies cough, dyspnea or wheezes Cardiovascular: Denies palpitation, chest discomfort or lower extremity swelling Gastrointestinal:  Denies nausea, heartburn or change in bowel habits Skin: Denies abnormal skin rashes Lymphatics: Denies new lymphadenopathy or easy bruising Neurological:Denies numbness, tingling or new weaknesses Behavioral/Psych: Mood is stable, no new changes  All other systems were reviewed with the patient and are negative.  PHYSICAL EXAMINATION: ECOG PERFORMANCE STATUS: 1 - Symptomatic but completely ambulatory  Vitals:   02/04/16 1310  BP: 140/77  Pulse: 71  Resp: 17  Temp: 97.7 F (36.5 C)   Filed Weights   02/04/16 1310  Weight: 125 lb 6.4 oz (56.9 kg)    GENERAL:alert, no distress and comfortable SKIN: skin color, texture, turgor are normal, no rashes or significant lesions EYES: normal, conjunctiva are pink and non-injected, sclera clear OROPHARYNX:no exudate, no erythema and lips, buccal mucosa, and tongue normal  NECK: supple, thyroid normal size, non-tender, without nodularity LYMPH:  no palpable lymphadenopathy in the cervical, axillary or inguinal LUNGS: clear to auscultation and percussion with normal breathing effort HEART: regular rate & rhythm and no murmurs and no lower extremity edema ABDOMEN:abdomen soft, non-tender and normal bowel sounds Musculoskeletal:no cyanosis of digits and no clubbing  PSYCH: alert & oriented x 3 with fluent speech NEURO: no focal motor/sensory deficits  LABORATORY DATA:  I have reviewed the data as listed CBC Latest Ref Rng & Units  02/04/2016 01/13/2016 12/23/2015  WBC 3.9 - 10.3 10e3/uL 8.6 8.1 7.6  Hemoglobin 11.6 - 15.9 g/dL 35.6 68.5 39.8  Hematocrit 34.8 - 46.6 % 37.3 36.7 34.3(L)  Platelets 145 - 400 10e3/uL 223 245 231    CMP Latest Ref Rng & Units 02/04/2016 01/13/2016 12/23/2015  Glucose 70 - 140 mg/dl 86 80 97  BUN 7.0 - 95.3 mg/dL 73.7 35.4 61.4  Creatinine 0.6 - 1.1 mg/dL 9.9(H) 1.9(J) 6.5(S)  Sodium 136 - 145 mEq/L 142 143 143  Potassium 3.5 - 5.1 mEq/L 3.7 3.5 3.6  Chloride 101 - 111 mmol/L - - -  CO2 22 - 29 mEq/L _0 Calcium 8.4 - 10.4 mg/dL 9.5 9.9 9.7  Total Protein 6.4 - 8.3 g/dL 7.7 8.1 7.6  Total Bilirubin 0.20 - 1.20 mg/dL 2.00(H) 1.70(H) 1.80(H)  Alkaline Phos 40 - 150 U/L 66 77 74  AST 5 - 34 U/L _1 ALT 0 - 55 U/L _2 CEA  01/23/2015: 5.7 06/05/2015: 6.1 08/04/2015: 10.3 10/12/2015: 13.29 (in-house)  11/04/2015: 12.83 01/13/2016: 8.57  PATHOLOGY REPORT: Diagnosis 01/23/2015 Colon, segmental resection, with terminal ileum and cecum - INVASIVE ADENOCARCINOMA EXTENDING INTO PERICECAL CONNECTIVE TISSUE AND INVOLVING SMALL INTESTINE. - THREE BENIGN LYMPH NODES (0/3). - SEE ONCOLOGY TABLE BELOW.  Microscopic Comment COLON AND RECTUM (INCLUDING TRANS-ANAL RESECTION): Specimen: Terminal ileum and cecum. Procedure: Resection. Tumor site: Cecum adjacent to appendix. Specimen integrity: Intact. Macroscopic intactness of mesorectum: Not applicable. Macroscopic tumor perforation: No. Invasive tumor: Maximum size: 4.0 cm. Histologic type(s): Colorectal adenocarcinoma. Histologic grade and differentiation: G2: moderately differentiated/low grade. Type of polyp in which invasive carcinoma arose: No residual polyp. Microscopic extension of invasive tumor: Into pericecal connective tissue and terminal ileum. Lymph-Vascular invasion: Not identified. Peri-neural invasion: Not identified. Tumor deposit(s) (discontinuous extramural extension): No. Resection margins: Proximal  margin: Free of tumor. Distal margin: Free of tumor. Circumferential (radial) (posterior ascending, posterior descending; lateral and posterior mid-rectum; and entire lower 1/3 rectum): Free of tumor. Mesenteric margin (sigmoid and transverse): N/A. Distance closest margin (if all above margins negative): N/A. Treatment effect (neo-adjuvant therapy): No. Additional polyp(s): No. Non-neoplastic findings: N/A. Lymph nodes: number examined 3; number positive: 0. Pathologic Staging: pT4b, pN0, pMX. Ancillary studies: Mismatch repair protein by immunohistochemistry pending. Comments: There is a colorectal-type adenocarcinoma arising in the cecum in the area of the appendiceal orifice. The tumor extends into the pericecal connective tissue, and involves the attached segment of terminal ileum. Immunohistochemistry shows the tumor is positive with CDX2 and cytokeratin 20, and negative with cytokeratin 7, estrogen receptor and WT-1 supporting a diagnosis of primary colorectal adenocarcinoma. ADDITIONAL INFORMATION: Mismatch Repair (MMR) Protein Immunohistochemistry (IHC) IHC Expression Result: MLH1: Preserved nuclear expression (greater 50% tumor expression) MSH2: Preserved nuclear expression (greater 50% tumor expression) MSH6: Preserved nuclear expression (greater 50% tumor expression) PMS2: Preserved nuclear expression (greater 50% tumor expression) * Internal control demonstrates intact nuclear expression Interpretation: NORMAL There is preserved expression of the major and minor MMR proteins. There is a very low probability that microsatellite instability (MSI) is present. However, certain clinically significant MMR protein mutations may result in preservation of nuclear expression. It is recommended that the preservation of protein expression be correlated with molecular based MSI testing.    RADIOGRAPHIC STUDIES: I have personally reviewed the radiological images as listed and agreed  with the findings in the report.  Ct chest, Abdomen Pelvis Wo Contrast 01/08/2016  IMPRESSION: 1. 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 2. Hepatic and renal cysts. 3.  Aortoiliac atherosclerotic vascular disease. 4. Lumbar spondylosis and degenerative disc disease with multilevel impingement.   ASSESSMENT & PLAN: 75 year old African-American female, with past medical history of moderate dementia, independent with ADLs, hypertension, presents with small bowel obstruction and weight loss.  1. 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 -I personally reviewed her recent PET scan images, unfortunately the scan showed hypermetabolic peritoneal and liver metastases. Given her elevated CEA, normal CA125, this is most consistent with recurrent colon cancer -We discussed is that her prostatic colon cancer is not curable at this stage, and the treatment goal is palliative. -She has started Xeloda 1531m bid, 2 weeks on and one week off. -I reviewed her [facility] one genomic testing as always patient and her daughter, her tumor has K-ras mutation, she is not a candidate for EGFR inhibitor. The tumor has MSI stable, she is not a candidate for immunotherapy, although she may be a candidate for clinical [facility] -she is tolerating Xeloda and Avastin well -I reviewed her restaging CT scan from 02/04/2016, which showed overall stable disease, no new lesions. We'll continue her chemotherapy. -lab results reviewed, she has slightly worse elevated bilirubin, renal function stable, we'll continue monitoring.  2.  Anemia -mild, stable, we'll continue monitoring  3. CKD stage III, right hydroureter -Her Cr was around 1 in 08/2013, Cr increased to 2.4 on 01/20/2015 when she was diagnosed with colon cancer due to dehydration, improved some and has been around 1.2-1.6 lately.  -Her recent CT scan showed right hydroureter, which probably contributes to her renal insufficiency. -she had ureter stent placement again in 12/2015  4. HTN, dementia -She will continue her medication and follow-up with her primary care physician. -She is scheduled to see a psychiatrist to discuss if she needs antidepressant medication, although she does not appear to be depressed to me.  5. Hyperbilirubinemia -probably related to liver metastasis, vs Xeloda  -We'll continue monitoring closely. It's slightly worse today   Plan -lab reviewed, will proceed cycle 5 Xeloda and continue Avastin 7.538mkg every 3 weeks today  -I will see her back in 3 weeks before chemo   All questions were answered. The patient knows to call the clinic with any problems, questions or concerns.  I spent 20 minutes counseling the patient face to face. The total time spent in the appointment was 25 minutes and more than 50% was on counseling.     FeTruitt MerleMD 02/04/2016

## 2016-02-04 NOTE — Patient Instructions (Signed)
[facility] Discharge Instructions for Patients Receiving Chemotherapy  Today you received the following chemotherapy agents Avastin  To help prevent nausea and vomiting after your treatment, we encourage you to take your nausea medication as needed   If you develop nausea and vomiting that is not controlled by your nausea medication, call the clinic.   BELOW ARE SYMPTOMS THAT SHOULD BE REPORTED IMMEDIATELY:  *FEVER GREATER THAN 100.5 F  *CHILLS WITH OR WITHOUT FEVER  NAUSEA AND VOMITING THAT IS NOT CONTROLLED WITH YOUR NAUSEA MEDICATION  *UNUSUAL SHORTNESS OF BREATH  *UNUSUAL BRUISING OR BLEEDING  TENDERNESS IN MOUTH AND THROAT WITH OR WITHOUT PRESENCE OF ULCERS  *URINARY PROBLEMS  *BOWEL PROBLEMS  UNUSUAL RASH Items with * indicate a potential emergency and should be followed up as soon as possible.  Feel free to call the clinic you have any questions or concerns. The clinic phone number is [phone].  Please show the CHEMO ALERT CARD at check-in to the Emergency Department and triage nurse.   

## 2016-02-05 ENCOUNTER — Other Ambulatory Visit: Payer: Self-pay | Admitting: *Deleted

## 2016-02-05 ENCOUNTER — Telehealth: Payer: Self-pay | Admitting: *Deleted

## 2016-02-05 DIAGNOSIS — F411 Generalized anxiety disorder: Secondary | ICD-10-CM

## 2016-02-05 NOTE — Telephone Encounter (Signed)
Per LOS I have scheduled appt and notified the scheduler 

## 2016-02-11 ENCOUNTER — Encounter (HOSPITAL_BASED_OUTPATIENT_CLINIC_OR_DEPARTMENT_OTHER): Payer: Self-pay | Admitting: *Deleted

## 2016-02-12 ENCOUNTER — Encounter (HOSPITAL_BASED_OUTPATIENT_CLINIC_OR_DEPARTMENT_OTHER): Payer: Self-pay | Admitting: *Deleted

## 2016-02-12 NOTE — Progress Notes (Signed)
SPOKE W/ PT DAUGHTER, VANESSA (WHOM IS HCPOA).  NPO AFTER MN.  ARRIVE AT 0915.  CURRENT LAB RESULTS AND EKG IN CHART AND EKG.  PT HAS DEMENTIA.

## 2016-02-15 ENCOUNTER — Ambulatory Visit (HOSPITAL_BASED_OUTPATIENT_CLINIC_OR_DEPARTMENT_OTHER): Payer: Medicare Other | Admitting: Anesthesiology

## 2016-02-15 ENCOUNTER — Ambulatory Visit (HOSPITAL_BASED_OUTPATIENT_CLINIC_OR_DEPARTMENT_OTHER)
Admission: RE | Admit: 2016-02-15 | Discharge: 2016-02-15 | Disposition: A | Discharge status: Home / Self Care | Payer: Medicare Other | Source: Ambulatory Visit | Attending: Urology | Admitting: Urology

## 2016-02-15 ENCOUNTER — Encounter (HOSPITAL_BASED_OUTPATIENT_CLINIC_OR_DEPARTMENT_OTHER): Payer: Self-pay

## 2016-02-15 ENCOUNTER — Encounter (HOSPITAL_BASED_OUTPATIENT_CLINIC_OR_DEPARTMENT_OTHER)
Admission: RE | Disposition: A | Discharge status: Home / Self Care | Payer: Self-pay | Source: Ambulatory Visit | Attending: Urology

## 2016-02-15 DIAGNOSIS — Z87891 Personal history of nicotine dependence: Secondary | ICD-10-CM | POA: Insufficient documentation

## 2016-02-15 DIAGNOSIS — K219 Gastro-esophageal reflux disease without esophagitis: Secondary | ICD-10-CM | POA: Diagnosis not present

## 2016-02-15 DIAGNOSIS — F419 Anxiety disorder, unspecified: Secondary | ICD-10-CM | POA: Diagnosis not present

## 2016-02-15 DIAGNOSIS — N1339 Other hydronephrosis: Secondary | ICD-10-CM | POA: Diagnosis not present

## 2016-02-15 DIAGNOSIS — E785 Hyperlipidemia, unspecified: Secondary | ICD-10-CM | POA: Insufficient documentation

## 2016-02-15 DIAGNOSIS — Z85038 Personal history of other malignant neoplasm of large intestine: Secondary | ICD-10-CM | POA: Diagnosis not present

## 2016-02-15 DIAGNOSIS — I129 Hypertensive chronic kidney disease with stage 1 through stage 4 chronic kidney disease, or unspecified chronic kidney disease: Secondary | ICD-10-CM | POA: Insufficient documentation

## 2016-02-15 DIAGNOSIS — J449 Chronic obstructive pulmonary disease, unspecified: Secondary | ICD-10-CM | POA: Insufficient documentation

## 2016-02-15 DIAGNOSIS — F0390 Unspecified dementia without behavioral disturbance: Secondary | ICD-10-CM | POA: Insufficient documentation

## 2016-02-15 DIAGNOSIS — N1330 Unspecified hydronephrosis: Secondary | ICD-10-CM | POA: Diagnosis present

## 2016-02-15 DIAGNOSIS — C787 Secondary malignant neoplasm of liver and intrahepatic bile duct: Secondary | ICD-10-CM | POA: Diagnosis not present

## 2016-02-15 DIAGNOSIS — M1990 Unspecified osteoarthritis, unspecified site: Secondary | ICD-10-CM | POA: Insufficient documentation

## 2016-02-15 DIAGNOSIS — C786 Secondary malignant neoplasm of retroperitoneum and peritoneum: Secondary | ICD-10-CM | POA: Diagnosis not present

## 2016-02-15 DIAGNOSIS — N183 Chronic kidney disease, stage 3 (moderate): Secondary | ICD-10-CM | POA: Diagnosis not present

## 2016-02-15 HISTORY — DX: Unspecified dementia, moderate, without behavioral disturbance, psychotic disturbance, mood disturbance, and anxiety: F03.B0

## 2016-02-15 HISTORY — DX: Unspecified dementia without behavioral disturbance: F03.90

## 2016-02-15 HISTORY — DX: Anxiety disorder, unspecified: F41.9

## 2016-02-15 HISTORY — PX: CYSTOSCOPY W/ URETERAL STENT PLACEMENT: SHX1429

## 2016-02-15 SURGERY — CYSTOSCOPY, WITH RETROGRADE PYELOGRAM AND URETERAL STENT INSERTION
Anesthesia: General | Site: Ureter | Laterality: Right

## 2016-02-15 MED ORDER — CEFAZOLIN IN D5W 1 GM/50ML IV SOLN
1.0000 g | INTRAVENOUS | Status: DC
  Filled 2016-02-15: qty 50

## 2016-02-15 MED ORDER — ONDANSETRON HCL 4 MG/2ML IJ SOLN
INTRAMUSCULAR | Status: DC | PRN
  Administered 2016-02-15: 4 mg via INTRAVENOUS

## 2016-02-15 MED ORDER — FENTANYL CITRATE (PF) 100 MCG/2ML IJ SOLN
25.0000 ug | INTRAMUSCULAR | Status: DC | PRN
  Filled 2016-02-15: qty 1

## 2016-02-15 MED ORDER — ONDANSETRON HCL 4 MG/2ML IJ SOLN
INTRAMUSCULAR | Status: AC
  Filled 2016-02-15: qty 2

## 2016-02-15 MED ORDER — DEXAMETHASONE SODIUM PHOSPHATE 4 MG/ML IJ SOLN
INTRAMUSCULAR | Status: DC | PRN
  Administered 2016-02-15: 10 mg via INTRAVENOUS

## 2016-02-15 MED ORDER — DEXAMETHASONE SODIUM PHOSPHATE 10 MG/ML IJ SOLN
INTRAMUSCULAR | Status: AC
  Filled 2016-02-15: qty 1

## 2016-02-15 MED ORDER — MEPERIDINE HCL 25 MG/ML IJ SOLN
6.2500 mg | INTRAMUSCULAR | Status: DC | PRN
  Filled 2016-02-15: qty 1

## 2016-02-15 MED ORDER — STERILE WATER FOR IRRIGATION IR SOLN
Status: DC | PRN
  Administered 2016-02-15: 3000 mL

## 2016-02-15 MED ORDER — CEFAZOLIN SODIUM-DEXTROSE 2-4 GM/100ML-% IV SOLN
2.0000 g | INTRAVENOUS | Status: AC
  Administered 2016-02-15: 2 g via INTRAVENOUS
  Filled 2016-02-15: qty 100

## 2016-02-15 MED ORDER — LIDOCAINE 2% (20 MG/ML) 5 ML SYRINGE
INTRAMUSCULAR | Status: DC | PRN
  Administered 2016-02-15: 70 mg via INTRAVENOUS

## 2016-02-15 MED ORDER — CEFAZOLIN SODIUM-DEXTROSE 2-4 GM/100ML-% IV SOLN
INTRAVENOUS | Status: AC
  Filled 2016-02-15: qty 100

## 2016-02-15 MED ORDER — PROPOFOL 10 MG/ML IV BOLUS
INTRAVENOUS | Status: DC | PRN
  Administered 2016-02-15: 150 mg via INTRAVENOUS

## 2016-02-15 MED ORDER — PROMETHAZINE HCL 25 MG/ML IJ SOLN
6.2500 mg | INTRAMUSCULAR | Status: DC | PRN
  Filled 2016-02-15: qty 1

## 2016-02-15 MED ORDER — FENTANYL CITRATE (PF) 100 MCG/2ML IJ SOLN
INTRAMUSCULAR | Status: AC
  Filled 2016-02-15: qty 2

## 2016-02-15 MED ORDER — FENTANYL CITRATE (PF) 100 MCG/2ML IJ SOLN
INTRAMUSCULAR | Status: DC | PRN
  Administered 2016-02-15: 50 ug via INTRAVENOUS

## 2016-02-15 MED ORDER — PROPOFOL 10 MG/ML IV BOLUS
INTRAVENOUS | Status: AC
  Filled 2016-02-15: qty 20

## 2016-02-15 MED ORDER — TRAMADOL HCL 50 MG PO TABS: 50.0000 mg | ORAL_TABLET | Freq: Four times a day (QID) | ORAL | 0 refills | Status: DC | PRN

## 2016-02-15 MED ORDER — IOHEXOL 300 MG/ML  SOLN
INTRAMUSCULAR | Status: DC | PRN
  Administered 2016-02-15: 7 mL

## 2016-02-15 MED ORDER — SODIUM CHLORIDE 0.9 % IV SOLN
INTRAVENOUS | Status: DC
  Administered 2016-02-15: 10:00:00 via INTRAVENOUS
  Filled 2016-02-15: qty 1000

## 2016-02-15 SURGICAL SUPPLY — 21 items
BAG DRAIN URO-CYSTO SKYTR STRL (DRAIN) ×2 IMPLANT
CATH INTERMIT  6FR 70CM (CATHETERS) ×2 IMPLANT
CLOTH BEACON ORANGE TIMEOUT ST (SAFETY) ×2 IMPLANT
FIBER LASER FLEXIVA 365 (UROLOGICAL SUPPLIES) IMPLANT
FIBER LASER TRAC TIP (UROLOGICAL SUPPLIES) IMPLANT
GLOVE BIO SURGEON STRL SZ8 (GLOVE) ×2 IMPLANT
GOWN STRL REUS W/ TWL LRG LVL3 (GOWN DISPOSABLE) ×1 IMPLANT
GOWN STRL REUS W/ TWL XL LVL3 (GOWN DISPOSABLE) ×1 IMPLANT
GOWN STRL REUS W/TWL LRG LVL3 (GOWN DISPOSABLE) ×1
GOWN STRL REUS W/TWL XL LVL3 (GOWN DISPOSABLE) ×1
GUIDEWIRE ANG ZIPWIRE 038X150 (WIRE) ×2 IMPLANT
GUIDEWIRE STR DUAL SENSOR (WIRE) IMPLANT
KIT ROOM TURNOVER WOR (KITS) ×2 IMPLANT
MANIFOLD NEPTUNE II (INSTRUMENTS) ×2 IMPLANT
PACK CYSTO (CUSTOM PROCEDURE TRAY) ×2 IMPLANT
STENT URET 6FRX24 CONTOUR (STENTS) ×2 IMPLANT
STENT URET 6FRX26 CONTOUR (STENTS) IMPLANT
SYRINGE 10CC LL (SYRINGE) ×2 IMPLANT
TUBE CONNECTING 12X1/4 (SUCTIONS) ×2 IMPLANT
TUBE FEEDING 8FR 16IN STR KANG (MISCELLANEOUS) IMPLANT
WATER STERILE IRR 3000ML UROMA (IV SOLUTION) ×2 IMPLANT

## 2016-02-15 NOTE — H&P (Signed)
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 She is managed with a right indwelling stent  Past Medical History:  Diagnosis Date  . Anxiety   . Arthritis   . Bilateral renal cysts   . Centrilobular emphysema ([place])   . Chronic low back pain   . CKD (chronic kidney disease), stage III   . Colon cancer [facility]) oncologist-  dr [name]   dx 01-23-2015  right colon Invasive adenocarcinoma into pericecal connective tissue and involving small intestine , Stage IIc, Grade 2, LVI(-),  (pT4b N0 M0) /  s/p resection termial ileum and cecum and chemotherapy 01-06-217 to 07-06-2015/   08/ 2017  per PET  recurrent w/ liver mets and peritoneal carcinomatosis  . Complication of anesthesia    can be combative due to dementia  . Full dentures   . History of gastroesophageal reflux (GERD)    changes diet  . History of pelvic fracture   . Hydronephrosis, right   . Hyperlipidemia   . Hypertension   . Liver metastasis ([place])   . 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 Pulmonary nodules   . Right ovarian cyst    Past Surgical History:  Procedure Laterality Date  . CATARACT EXTRACTION W/ INTRAOCULAR LENS  IMPLANT, BILATERAL  2005 approx  . CYSTOSCOPY W/ URETERAL STENT PLACEMENT Right 12/03/2015   Procedure: CYSTOSCOPY WITH RETROGRADE PYELOGRAM/URETERAL STENT PLACEMENT;  Surgeon: Cleon Gustin, MD;  Location: [facility];  Service: Urology;  Laterality: Right;  . CYSTOSCOPY WITH RETROGRADE PYELOGRAM, URETEROSCOPY AND STENT PLACEMENT Right 09/21/2015   Procedure: CYSTOSCOPY WITH BIL RETROGRADE PYELOGRAM, URETEROSCOPY, RIGHT STENT PLACEMENT ;  Surgeon: Cleon Gustin, MD;  Location: [facility];  Service: Urology;  Laterality: Right;  . LAPAROSCOPY N/A 01/23/2015    Procedure: DIAGNOSTIC LAPAROSCOPY, EXPLORATORY LAPAROTOMY  RIGHT HEMI-COLECTOMY FOR OBSTRUCTION OF RIGHT TERMINAL ILEUM;  Surgeon: [name], MD;  Location: [facility];  Service: General;  Laterality: N/A;    Home Medications:  Prescriptions Prior to Admission  Medication Sig Dispense Refill Last Dose  . amLODipine (NORVASC) 5 MG tablet Take 5 mg by mouth every morning.    02/15/2016 at 0815  . antiseptic oral rinse (BIOTENE) LIQD 15 mLs by Mouth Rinse route daily. [place] CLINICAL   02/14/2016 at Unknown time  . capecitabine (XELODA) 500 MG tablet Take 3 tablets (1,500 mg total) by mouth 2 (two) times daily after a meal. 70 tablet 0 02/14/2016 at Unknown time  . Cholecalciferol (VITAMIN D3) 2000 UNITS TABS Take by mouth daily.   02/14/2016 at Unknown time  . Coenzyme Q10 (CO Q 10) 100 MG CAPS Take by mouth daily.   02/15/2016 at 0815  . donepezil (ARICEPT) 10 MG tablet Take 1 tab daily (Patient taking differently: Take 10 mg by mouth every evening. Take 1 tab daily) 30 tablet 11 02/14/2016 at Unknown time  . Multiple Vitamins-Minerals (WOMENS MULTIVITAMIN PO) Take 1 tablet by mouth daily.   02/14/2016 at Unknown time  . polyethylene glycol (MIRALAX / GLYCOLAX) packet Take 17 g by mouth daily. (Patient taking differently: Take 17 g by mouth daily as needed. ) 14 each 0 02/14/2016 at Unknown time  . simvastatin (ZOCOR) 20 MG tablet Take 20 mg by mouth daily at 6 PM.   02/14/2016 at Unknown time  Allergies:  Allergies  Allergen Reactions  . Namenda [Memantine Hcl] Other (See Comments)    hallucinations  . Oxycodone Other (See Comments)    Hallucinations     Family History  Problem Relation Age of Onset  . Diabetes Mellitus II Father   . Cancer Mother 39    ovarian cancer   . Cervical cancer Other    Social History:  reports that she quit smoking about 12 months ago. Her smoking use included Cigarettes. She has a 12.50 pack-year smoking history. She has never used smokeless  tobacco. She reports that she drinks about 1.8 oz of alcohol per week . She reports that she does not use drugs.  Review of Systems  All other systems reviewed and are negative.   Physical Exam:  Vital signs in last 24 hours: Temp:  [98.5 F (36.9 C)] 98.5 F (36.9 C) (12/11 0915) Pulse Rate:  [65] 65 (12/11 0915) Resp:  [16] 16 (12/11 0915) BP: (150)/(72) 150/72 (12/11 0915) SpO2:  [100 %] 100 % (12/11 0915) Weight:  [56.2 kg (124 lb)] 56.2 kg (124 lb) (12/11 0915) Physical Exam  Constitutional: She is oriented to person, place, and time. She appears well-developed and well-nourished.  HENT:  Head: Normocephalic and atraumatic.  Eyes: EOM are normal. Pupils are equal, round, and reactive to light.  Neck: Normal range of motion. No thyromegaly present.  Cardiovascular: Normal rate and regular rhythm.   Respiratory: Effort normal. No respiratory distress.  GI: Soft. She exhibits no distension.  Musculoskeletal: Normal range of motion. She exhibits no edema.  Neurological: She is alert and oriented to person, place, and time.  Skin: Skin is warm and dry.  Psychiatric: She has a normal mood and affect. Her behavior is normal. Judgment and thought content normal.    Laboratory Data:  No results found for this or any previous visit (from the past 24 hour(s)). No results found for this or any previous visit (from the past 240 hour(s)). Creatinine: No results for input(s): CREATININE in the last 168 hours. Baseline Creatinine: unknwon  Impression/Assessment:  75yo with malignant right ureteral obstruction  Plan:  The risks/benefts/alternatives to right ureterals tent exchange was explained to the patient and she understands and wishes to proceed with surgery  [name] 02/15/2016, 10:23 AM

## 2016-02-15 NOTE — Op Note (Signed)
.  Preoperative diagnosis: right malignant hydronephrosis Postoperative diagnosis: Same  Procedure: 1 cystoscopy 2. right retrograde pyelography 3.  Intraoperative fluoroscopy, under one hour, with interpretation 4. right 6 x 24 JJ stent exchange  Attending: [name]  Anesthesia: General  Estimated blood loss: None  Drains: Right 6 x 24 JJ ureteral stent without tether  Specimens: none  Antibiotics: Ancef  Findings: right mid ureteral compression mild hydronephrosis. No masses/lesions in the bladder. Ureteral orifices in normal anatomic location.  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  Procedure her in detail: The patient was brought to the operating room and a brief timeout was done to ensure correct patient, correct procedure, correct site.  General anesthesia was administered patient was placed in dorsal lithotomy position.  Their genitalia was then prepped and draped in usual sterile fashion.  A rigid 65 French cystoscope was passed in the urethra and the bladder.  Bladder was inspected free masses or lesions.  the ureteral orifices were in the normal orthotopic locations.  Using a grasper the right ureteral stetn was brought to the urethral meatus. A zipwire was advanced through the stent and up to the renal pelvis. a 6 french ureteral catheter was then instilled into the right ureteral orifice.  a gentle retrograde was obtained and findings noted above.   We then placed a 6 x 24 double-j ureteral stent over the original zip wire.  We then removed the wire and good coil was noted in the the renal pelvis under fluoroscopy and the bladder under direct vision.   the bladder was then drained and this concluded the procedure which was well tolerated by patient.  Complications:  None  Condition: Stable, extubated, transferred to PACU  Plan: Patient is to be discharged home. She will followup in 4 weeks with a renal US.

## 2016-02-15 NOTE — Anesthesia Preprocedure Evaluation (Addendum)
Anesthesia Evaluation  Patient identified by MRN, date of birth, ID band Patient awake    Reviewed: Allergy & Precautions, NPO status , Patient's Chart, lab work & pertinent test results  History of Anesthesia Complications (+) Emergence Delirium and history of anesthetic complications  Airway Mallampati: II  TM Distance: >3 FB Neck ROM: Full    Dental  (+) Edentulous Upper, Edentulous Lower   Pulmonary COPD, former smoker,    Pulmonary exam normal breath sounds clear to auscultation       Cardiovascular Exercise Tolerance: Good hypertension, Pt. on medications Normal cardiovascular exam Rhythm:Regular Rate:Normal     Neuro/Psych PSYCHIATRIC DISORDERS H/O Dementia    GI/Hepatic Neg liver ROS, GERD  ,  Endo/Other  negative endocrine ROS  Renal/GU Renal disease     Musculoskeletal  (+) Arthritis ,   Abdominal   Peds  Hematology  (+) anemia ,   Anesthesia Other Findings   Reproductive/Obstetrics negative OB ROS                             Anesthesia Physical  Anesthesia Plan  ASA: III  Anesthesia Plan: General   Post-op Pain Management:    Induction: Intravenous  Airway Management Planned: LMA  Additional Equipment:   Intra-op Plan:   Post-operative Plan: Extubation in OR  Informed Consent: I have reviewed the patients History and Physical, chart, labs and discussed the procedure including the risks, benefits and alternatives for the proposed anesthesia with the patient or authorized representative who has indicated his/her understanding and acceptance.   Dental advisory given  Plan Discussed with: CRNA  Anesthesia Plan Comments:         Anesthesia Quick Evaluation

## 2016-02-15 NOTE — Discharge Instructions (Signed)
Ureteral Stent Implantation, Care After Introduction Refer to this sheet in the next few weeks. These instructions provide you with information about caring for yourself after your procedure. Your health care provider may also give you more specific instructions. Your treatment has been planned according to current medical practices, but problems sometimes occur. Call your health care provider if you have any problems or questions after your procedure. What can I expect after the procedure? After the procedure, it is common to have:  Nausea.  Mild pain when you urinate. You may feel this pain in your lower back or lower abdomen. Pain should stop within a few minutes after you urinate. This may last for up to 1 week.  A small amount of blood in your urine for several days. Follow these instructions at home:   Medicines  Take over-the-counter and prescription medicines only as told by your health care provider.  If you were prescribed an antibiotic medicine, take it as told by your health care provider. Do not stop taking the antibiotic even if you start to feel better.  Do not drive for 24 hours if you received a sedative.  Do not drive or operate heavy machinery while taking prescription pain medicines. Activity  Return to your normal activities as told by your health care provider. Ask your health care provider what activities are safe for you.  Do not lift anything that is heavier than 10 lb (4.5 kg). Follow this limit for 1 week after your procedure, or for as long as told by your health care provider. General instructions  Watch for any blood in your urine. Call your health care provider if the amount of blood in your urine increases.  If you have a catheter:  Follow instructions from your health care provider about taking care of your catheter and collection bag.  Do not take baths, swim, or use a hot tub until your health care provider approves.  Drink enough fluid to keep  your urine clear or pale yellow.  Keep all follow-up visits as told by your health care provider. This is important. Contact a health care provider if:  You have pain that gets worse or does not get better with medicine, especially pain when you urinate.  You have difficulty urinating.  You feel nauseous or you vomit repeatedly during a period of more than 2 days after the procedure. Get help right away if:  Your urine is dark red or has blood clots in it.  You are leaking urine (have incontinence).  The end of the stent comes out of your urethra.  You cannot urinate.  You have sudden, sharp, or severe pain in your abdomen or lower back.  You have a fever. This information is not intended to replace advice given to you by your health care provider. Make sure you discuss any questions you have with your health care provider. Document Released: 10/24/2012 Document Revised: 07/30/2015 Document Reviewed: 09/05/2014  2017 [employer]    Post Anesthesia Home Care Instructions  Activity: Get plenty of rest for the remainder of the day. A responsible adult should stay with you for 24 hours following the procedure.  For the next 24 hours, DO NOT: -Drive a car -Paediatric nurse -Drink alcoholic beverages -Take any medication unless instructed by your physician -Make any legal decisions or sign important papers.  Meals: Start with liquid foods such as gelatin or soup. Progress to regular foods as tolerated. Avoid greasy, spicy, heavy foods. If nausea and/or vomiting occur,  drink only clear liquids until the nausea and/or vomiting subsides. Call your physician if vomiting continues.  Special Instructions/Symptoms: Your throat may feel dry or sore from the anesthesia or the breathing tube placed in your throat during surgery. If this causes discomfort, gargle with warm salt water. The discomfort should disappear within 24 hours.  If you had a scopolamine patch placed behind your ear  for the management of post- operative nausea and/or vomiting:  1. The medication in the patch is effective for 72 hours, after which it should be removed.  Wrap patch in a tissue and discard in the trash. Wash hands thoroughly with soap and water. 2. You may remove the patch earlier than 72 hours if you experience unpleasant side effects which may include dry mouth, dizziness or visual disturbances. 3. Avoid touching the patch. Wash your hands with soap and water after contact with the patch.

## 2016-02-15 NOTE — Transfer of Care (Signed)
Immediate Anesthesia Transfer of Care Note  Patient: Beth Wilkins  Procedure(s) Performed: Procedure(s) (LRB): CYSTOSCOPY WITH RETROGRADE PYELOGRAM/URETERAL STENT REPLACEMENT (Right)  Patient Location: PACU  Anesthesia Type: General  Level of Consciousness: awake, oriented, sedated and patient cooperative  Airway & Oxygen Therapy: Patient Spontanous Breathing and Patient connected to face mask oxygen  Post-op Assessment: Report given to PACU RN and Post -op Vital signs reviewed and stable  Post vital signs: Reviewed and stable  Complications: No apparent anesthesia complications   Last Vitals:  Vitals:   02/15/16 0915 02/15/16 1228  BP: (!) 150/72 (P) 136/68  Pulse: 65 (P) 74  Resp: 16 (!) (P) 8  Temp: 36.9 C (P) 36.3 C    Last Pain:  Vitals:   02/15/16 0930  TempSrc:   PainSc: 5

## 2016-02-15 NOTE — Anesthesia Procedure Notes (Signed)
Procedure Name: LMA Insertion Date/Time: 02/15/2016 12:01 PM Performed by: Wanita Chamberlain Pre-anesthesia Checklist: Patient identified, Timeout performed, Emergency Drugs available, Suction available and Patient being monitored Patient Re-evaluated:Patient Re-evaluated prior to inductionOxygen Delivery Method: Circle system utilized Preoxygenation: Pre-oxygenation with 100% oxygen Intubation Type: IV induction Ventilation: Mask ventilation without difficulty LMA: LMA inserted LMA Size: 4.0 Number of attempts: 1 Placement Confirmation: positive ETCO2 and breath sounds checked- equal and bilateral Tube secured with: Tape Dental Injury: Teeth and Oropharynx as per pre-operative assessment

## 2016-02-15 NOTE — Anesthesia Postprocedure Evaluation (Signed)
Anesthesia Post Note  Patient: Beth Wilkins  Procedure(s) Performed: Procedure(s) (LRB): CYSTOSCOPY WITH RETROGRADE PYELOGRAM/URETERAL STENT REPLACEMENT (Right)  Patient location during evaluation: PACU Anesthesia Type: General Level of consciousness: sedated and patient cooperative Pain management: pain level controlled Vital Signs Assessment: post-procedure vital signs reviewed and stable Respiratory status: spontaneous breathing Cardiovascular status: stable Anesthetic complications: no    Last Vitals:  Vitals:   02/15/16 0915 02/15/16 1228  BP: (!) 150/72 136/68  Pulse: 65 74  Resp: 16 (!) 8  Temp: 36.9 C 36.3 C    Last Pain:  Vitals:   02/15/16 1345  TempSrc:   PainSc: 0-No pain                 Nolon Nations

## 2016-02-15 NOTE — Transfer of Care (Deleted)
Immediate Anesthesia Transfer of Care Note  Patient: Beth Wilkins  Procedure(s) Performed: Procedure(s): CYSTOSCOPY WITH RETROGRADE PYELOGRAM/URETERAL STENT REPLACEMENT (Right)  Patient Location: PACU  Anesthesia Type:General  Level of Consciousness: awake, alert , oriented and patient cooperative  Airway & Oxygen Therapy: Patient Spontanous Breathing and Patient connected to nasal cannula oxygen  Post-op Assessment: Report given to RN and Post -op Vital signs reviewed and stable  Post vital signs: Reviewed and stable  Last Vitals:  Vitals:   02/15/16 0915  BP: (!) 150/72  Pulse: 65  Resp: 16  Temp: 36.9 C    Last Pain:  Vitals:   02/15/16 0930  TempSrc:   PainSc: 5       Patients Stated Pain Goal:  (unable to determine) ([id number])  Complications: No apparent anesthesia complications

## 2016-02-16 ENCOUNTER — Encounter (HOSPITAL_BASED_OUTPATIENT_CLINIC_OR_DEPARTMENT_OTHER): Payer: Self-pay | Admitting: Urology

## 2016-02-24 ENCOUNTER — Telehealth: Payer: Self-pay | Admitting: Hematology

## 2016-02-24 ENCOUNTER — Ambulatory Visit (HOSPITAL_BASED_OUTPATIENT_CLINIC_OR_DEPARTMENT_OTHER): Payer: Medicare Other | Admitting: Hematology

## 2016-02-24 ENCOUNTER — Encounter: Payer: Self-pay | Admitting: Hematology

## 2016-02-24 ENCOUNTER — Ambulatory Visit (HOSPITAL_BASED_OUTPATIENT_CLINIC_OR_DEPARTMENT_OTHER): Payer: Medicare Other

## 2016-02-24 ENCOUNTER — Other Ambulatory Visit (HOSPITAL_BASED_OUTPATIENT_CLINIC_OR_DEPARTMENT_OTHER): Payer: Medicare Other

## 2016-02-24 VITALS — BP 158/75 | HR 64 | Temp 98.0°F | Resp 18 | Ht 66.0 in | Wt 127.4 lb

## 2016-02-24 VITALS — BP 132/56

## 2016-02-24 DIAGNOSIS — I10 Essential (primary) hypertension: Secondary | ICD-10-CM

## 2016-02-24 DIAGNOSIS — Z5112 Encounter for antineoplastic immunotherapy: Secondary | ICD-10-CM

## 2016-02-24 DIAGNOSIS — C182 Malignant neoplasm of ascending colon: Secondary | ICD-10-CM | POA: Diagnosis not present

## 2016-02-24 DIAGNOSIS — N183 Chronic kidney disease, stage 3 (moderate): Secondary | ICD-10-CM

## 2016-02-24 DIAGNOSIS — C180 Malignant neoplasm of cecum: Secondary | ICD-10-CM | POA: Diagnosis not present

## 2016-02-24 DIAGNOSIS — Z79899 Other long term (current) drug therapy: Secondary | ICD-10-CM

## 2016-02-24 LAB — COMPREHENSIVE METABOLIC PANEL
ALT: 12 U/L (ref 0–55)
ANION GAP: 10 meq/L (ref 3–11)
AST: 22 U/L (ref 5–34)
Albumin: 4 g/dL (ref 3.5–5.0)
Alkaline Phosphatase: 71 U/L (ref 40–150)
BILIRUBIN TOTAL: 1.39 mg/dL — AB (ref 0.20–1.20)
BUN: 17.9 mg/dL (ref 7.0–26.0)
CHLORIDE: 108 meq/L (ref 98–109)
CO2: 24 meq/L (ref 22–29)
Calcium: 9.5 mg/dL (ref 8.4–10.4)
Creatinine: 1.3 mg/dL — ABNORMAL HIGH (ref 0.6–1.1)
EGFR: 46 mL/min/{1.73_m2} — ABNORMAL LOW (ref >90)
GLUCOSE: 63 mg/dL — AB (ref 70–140)
Potassium: 3.8 mEq/L (ref 3.5–5.1)
SODIUM: 142 meq/L (ref 136–145)
TOTAL PROTEIN: 7.7 g/dL (ref 6.4–8.3)

## 2016-02-24 LAB — CBC WITH DIFFERENTIAL/PLATELET
BASO%: 0.7 % (ref 0.0–2.0)
Basophils Absolute: 0 10*3/uL (ref 0.0–0.1)
EOS%: 4.6 % (ref 0.0–7.0)
Eosinophils Absolute: 0.3 10*3/uL (ref 0.0–0.5)
HCT: 37.6 % (ref 34.8–46.6)
HGB: 12.7 g/dL (ref 11.6–15.9)
LYMPH%: 23.8 % (ref 14.0–49.7)
MCH: 32.5 pg (ref 25.1–34.0)
MCHC: 33.8 g/dL (ref 31.5–36.0)
MCV: 96.2 fL (ref 79.5–101.0)
MONO#: 0.5 10*3/uL (ref 0.1–0.9)
MONO%: 7.7 % (ref 0.0–14.0)
NEUT%: 63.2 % (ref 38.4–76.8)
NEUTROS ABS: 3.9 10*3/uL (ref 1.5–6.5)
PLATELETS: 232 10*3/uL (ref 145–400)
RBC: 3.91 10*6/uL (ref 3.70–5.45)
RDW: 19.5 % — ABNORMAL HIGH (ref 11.2–14.5)
WBC: 6.1 10*3/uL (ref 3.9–10.3)
lymph#: 1.5 10*3/uL (ref 0.9–3.3)

## 2016-02-24 LAB — UA PROTEIN, DIPSTICK - CHCC: PROTEIN: 30 mg/dL

## 2016-02-24 LAB — CEA (IN HOUSE-CHCC): CEA (CHCC-In House): 8.41 ng/mL — ABNORMAL HIGH (ref 0.00–5.00)

## 2016-02-24 MED ORDER — SODIUM CHLORIDE 0.9 % IV SOLN
Freq: Once | INTRAVENOUS | Status: AC
  Administered 2016-02-24: 12:00:00 via INTRAVENOUS

## 2016-02-24 MED ORDER — CAPECITABINE 500 MG PO TABS: 1000.0000 mg/m2 | ORAL_TABLET | Freq: Two times a day (BID) | ORAL | 0 refills | Status: DC

## 2016-02-24 MED ORDER — SODIUM CHLORIDE 0.9 % IV SOLN
7.5000 mg/kg | Freq: Once | INTRAVENOUS | Status: AC
  Administered 2016-02-24: 425 mg via INTRAVENOUS
  Filled 2016-02-24: qty 16

## 2016-02-24 NOTE — Patient Instructions (Signed)
[facility] Discharge Instructions for Patients Receiving Chemotherapy  Today you received the following chemotherapy agents Avastin   To help prevent nausea and vomiting after your treatment, we encourage you to take your nausea medication as directed.    If you develop nausea and vomiting that is not controlled by your nausea medication, call the clinic.   BELOW ARE SYMPTOMS THAT SHOULD BE REPORTED IMMEDIATELY:  *FEVER GREATER THAN 100.5 F  *CHILLS WITH OR WITHOUT FEVER  NAUSEA AND VOMITING THAT IS NOT CONTROLLED WITH YOUR NAUSEA MEDICATION  *UNUSUAL SHORTNESS OF BREATH  *UNUSUAL BRUISING OR BLEEDING  TENDERNESS IN MOUTH AND THROAT WITH OR WITHOUT PRESENCE OF ULCERS  *URINARY PROBLEMS  *BOWEL PROBLEMS  UNUSUAL RASH Items with * indicate a potential emergency and should be followed up as soon as possible.  Feel free to call the clinic you have any questions or concerns. The clinic phone number is [phone].  Please show the CHEMO ALERT CARD at check-in to the Emergency Department and triage nurse.   

## 2016-02-24 NOTE — Telephone Encounter (Signed)
Gave patient appointment schedule for January. avs report printing issues.

## 2016-02-24 NOTE — Progress Notes (Signed)
B[facility]kins  [phone] [phone]  Clinic Follow Up Note   Patient Care Team: [name], MD as PCP - General (Family Medicine) [name], MD as Consulting Physician (Medical Oncology) [name], MD as Consulting Physician (General Surgery) [name], MD as Consulting Physician (Orthopedic Surgery) Cleon Gustin, MD as Consulting Physician (Urology) 02/24/2016     CHIEF COMPLAINTS:  Follow up metastatic colon cancer  Oncology History   Cancer of right colon [facility])   Staging form: Colon and Rectum, AJCC 7th Edition     Pathologic stage from 01/23/2015: Stage IIC (T4b, N0, cM0) - Signed by [name], MD on 02/05/2015       Cancer of right colon ([place])   01/21/2015 Imaging     CT abdomen and pelvis showed high-grade small bowel obstruction,  right I [place] ureteral nephrosis,  multiple liver cysts.  CT chest was negative for metastatic lesion.      01/23/2015 Pathology Results     invasive adenocarcinoma extending into the pericecal connective tissue,  grade 2, LVI (-),  no perineural invasion, 3 lymph nodes were negative.      01/23/2015 Surgery      terminal ileum and cecum seegmental resection with anastomosis by Dr. Hassell Done      01/23/2015 Initial Diagnosis    Cancer of right colon ([place])      03/13/2015 - 07/06/2015 Chemotherapy    Xeloda 1500 mg bid X 14 days on-7 off, s/p 5 cycles, stopped early due to wrosening renal function      10/28/2015 Progression    PET scan showed multiple hypermetabolic peritoneal nodules, right ovarian cystic mass, and hypermetabolic liver lesion, highly suspicious for metastatic colon cancer. Small pulmonary nodules are indeterminate.       11/11/2015 -  Chemotherapy    Xeloda 1539m bid, 2 weeks on and one week off, Avastin was added on from cycle 3        HISTORY OF PRESENTING ILLNESS (02/05/2015):  [name] female  with past medical history of moderate dementia,  hypertension, is here because of recently diagnosed stage II colon cancer. She is accompanied to the clinic by her daughter. History of manic is from the chart and her daughter.  She has had abdominal discomfort, nausea and vomiting, and norexia for  the past to 3 months along with 15 pounds weight loss, . Her nausea was mild and intermittent, she does not seek medical attention initially. She presented with  worsening symptoms for 2-3 weeks, and episodes of projectile vomiting and dehydration to emergency room, [facility] scan reviewed small bowel obstruction, and possible appendiceal rupture. She was brought to OR on 01/23/2015, underwent right hemicolectomy with anastomosis by Dr. MHassell Done Surgical path reviewed adenocarcinoma at the cecum. She was discharged home on 01/26/2015.  She has recovered well, eating much better than prior to surgery, move around without restriction. No pain, she has loose BM 3 times a day, and she has backed off her mirealax.  she lives with her daughter. She states her appetite is moderate, sometime low, and she refuses to eat if she doesn't have appetite. No nausea, vomiting, abdominal bloating since her surgery.  Her last colonoscopy was 6-8 years ago, and her stool OB was negative this year.   CURRENT THERAPY: Xeloda 15064mbid, 2 weeks on and one week off, started on 11/11/2015  INTERIM HISTORY: DoAngeleturns for follow-up. She is accompanied by her  daughter to clinic today. She is doing well overall. She reports left ear pain for the past 1 month, no discharge, headache, or fever. No hearing change. She otherwise doing well, denies any significant pain, nausea, issue with bowel movement, or other complaints. Her appetite and energy level are moderate, and stable. She is able to function at home, but needs supervision.  MEDICAL HISTORY:  Past Medical History:  Diagnosis Date  . Anxiety   . Arthritis   . Bilateral renal cysts   .  Centrilobular emphysema ([place])   . Chronic low back pain   . CKD (chronic kidney disease), stage III   . Colon cancer [facility]) oncologist-  dr [name]   dx 01-23-2015  right colon Invasive adenocarcinoma into pericecal connective tissue and involving small intestine , Stage IIc, Grade 2, LVI(-),  (pT4b N0 M0) /  s/p resection termial ileum and cecum and chemotherapy 01-06-217 to 07-06-2015/   08/ 2017  per PET  recurrent w/ liver mets and peritoneal carcinomatosis  . Complication of anesthesia    can be combative due to dementia  . Full dentures   . History of gastroesophageal reflux (GERD)    changes diet  . History of pelvic fracture   . Hydronephrosis, right   . Hyperlipidemia   . Hypertension   . Liver metastasis ([place])   . 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 Pulmonary nodules   . Right ovarian cyst     SURGICAL HISTORY: Past Surgical History:  Procedure Laterality Date  . CATARACT EXTRACTION W/ INTRAOCULAR LENS  IMPLANT, BILATERAL  2005 approx  . CYSTOSCOPY W/ URETERAL STENT PLACEMENT Right 12/03/2015   Procedure: CYSTOSCOPY WITH RETROGRADE PYELOGRAM/URETERAL STENT PLACEMENT;  Surgeon: Cleon Gustin, MD;  Location: [facility];  Service: Urology;  Laterality: Right;  . CYSTOSCOPY W/ URETERAL STENT PLACEMENT Right 02/15/2016   Procedure: CYSTOSCOPY WITH RETROGRADE PYELOGRAM/URETERAL STENT REPLACEMENT;  Surgeon: Cleon Gustin, MD;  Location: [facility];  Service: Urology;  Laterality: Right;  . CYSTOSCOPY WITH RETROGRADE PYELOGRAM, URETEROSCOPY AND STENT PLACEMENT Right 09/21/2015   Procedure: CYSTOSCOPY WITH BIL RETROGRADE PYELOGRAM, URETEROSCOPY, RIGHT STENT PLACEMENT ;  Surgeon: Cleon Gustin, MD;  Location: [facility];  Service: Urology;  Laterality: Right;  . LAPAROSCOPY N/A 01/23/2015   Procedure: DIAGNOSTIC LAPAROSCOPY, EXPLORATORY  LAPAROTOMY  RIGHT HEMI-COLECTOMY FOR OBSTRUCTION OF RIGHT TERMINAL ILEUM;  Surgeon: [name], MD;  Location: [facility];  Service: General;  Laterality: N/A;    SOCIAL HISTORY: Social History   Social History  . Marital status: Single    Spouse name: N/A  . Number of children: N/A  . Years of education: N/A   Occupational History  . Not on file.   Social History Main Topics  . Smoking status: Former Smoker    Packs/day: 0.25    Years: 50.00    Types: Cigarettes    Quit date: 01/25/2015  . Smokeless tobacco: Never Used  . Alcohol use 1.8 oz/week    2 Glasses of wine, 1 Cans of beer per week     Comment: occasional   . Drug use: No  . Sexual activity: Not on file   Other Topics Concern  . Not on file   Social History Narrative   Legally separated   Lives w/daughter, Candance Bohlman   Has total of #2 daughters and #1 son   Dementia  Fall Risk-ambulates w/cane    FAMILY HISTORY: Family History  Problem Relation Age of Onset  . Diabetes Mellitus II Father   . Cancer Mother 59    ovarian cancer   . Cervical cancer Other     ALLERGIES:  is allergic to namenda [memantine hcl] and oxycodone.  MEDICATIONS:  Current Outpatient Prescriptions  Medication Sig Dispense Refill  . amLODipine (NORVASC) 5 MG tablet Take 5 mg by mouth every morning.     Marland Kitchen antiseptic oral rinse (BIOTENE) LIQD 15 mLs by Mouth Rinse route daily. BIOTENE CLINICAL    . capecitabine (XELODA) 500 MG tablet Take 3 tablets (1,500 mg total) by mouth 2 (two) times daily after a meal. 70 tablet 0  . Cholecalciferol (VITAMIN D3) 2000 UNITS TABS Take by mouth daily.    . Coenzyme Q10 (CO Q 10) 100 MG CAPS Take by mouth daily.    Marland Kitchen donepezil (ARICEPT) 10 MG tablet Take 1 tab daily (Patient taking differently: Take 10 mg by mouth every evening. Take 1 tab daily) 30 tablet 11  . Multiple Vitamins-Minerals (WOMENS MULTIVITAMIN PO) Take 1 tablet by mouth daily.    . polyethylene glycol (MIRALAX / GLYCOLAX)  packet Take 17 g by mouth daily. (Patient taking differently: Take 17 g by mouth daily as needed. ) 14 each 0  . simvastatin (ZOCOR) 20 MG tablet Take 20 mg by mouth daily at 6 PM.    . traMADol (ULTRAM) 50 MG tablet Take 1 tablet (50 mg total) by mouth every 6 (six) hours as needed. 30 tablet 0   No current facility-administered medications for this visit.     REVIEW OF SYSTEMS:   Constitutional: Denies fevers, chills or abnormal night sweats Eyes: Denies blurriness of vision, double vision or watery eyes Ears, nose, mouth, throat, and face: Denies mucositis or sore throat Respiratory: Denies cough, dyspnea or wheezes Cardiovascular: Denies palpitation, chest discomfort or lower extremity swelling Gastrointestinal:  Denies nausea, heartburn or change in bowel habits Skin: Denies abnormal skin rashes Lymphatics: Denies new lymphadenopathy or easy bruising Neurological:Denies numbness, tingling or new weaknesses Behavioral/Psych: Mood is stable, no new changes  All other systems were reviewed with the patient and are negative.  PHYSICAL EXAMINATION: ECOG PERFORMANCE STATUS: 1 - Symptomatic but completely ambulatory  Vitals:   02/24/16 1045  BP: (!) 158/75  Pulse: 64  Resp: 18  Temp: 98 F (36.7 C)   Filed Weights   02/24/16 1045  Weight: 127 lb 6.4 oz (57.8 kg)   GENERAL:alert, no distress and comfortable SKIN: skin color, texture, turgor are normal, no rashes or significant lesions EYES: normal, conjunctiva are pink and non-injected, sclera clear OROPHARYNX:no exudate, no erythema and lips, buccal mucosa, and tongue normal  NECK: supple, thyroid normal size, non-tender, without nodularity LYMPH:  no palpable lymphadenopathy in the cervical, axillary or inguinal LUNGS: clear to auscultation and percussion with normal breathing effort HEART: regular rate & rhythm and no murmurs and no lower extremity edema ABDOMEN:abdomen soft, non-tender and normal bowel  sounds Musculoskeletal:no cyanosis of digits and no clubbing  PSYCH: alert & oriented x 3 with fluent speech NEURO: no focal motor/sensory deficits  LABORATORY DATA:  I have reviewed the data as listed CBC Latest Ref Rng & Units 02/24/2016 02/04/2016 01/13/2016  WBC 3.9 - 10.3 10e3/uL 6.1 8.6 8.1  Hemoglobin 11.6 - 15.9 g/dL 12.7 12.5 12.2  Hematocrit 34.8 - 46.6 % 37.6 37.3 36.7  Platelets 145 - 400 10e3/uL 232 223 245    CMP  Latest Ref Rng & Units 02/24/2016 02/04/2016 01/13/2016  Glucose 70 - 140 mg/dl 63(L) 86 80  BUN 7.0 - 26.0 mg/dL 17.9 20.1 14.6  Creatinine 0.6 - 1.1 mg/dL 1.3(H) 1.4(H) 1.4(H)  Sodium 136 - 145 mEq/L 142 142 143  Potassium 3.5 - 5.1 mEq/L 3.8 3.7 3.5  Chloride 101 - 111 mmol/L - - -  CO2 22 - 29 mEq/L _0 Calcium 8.4 - 10.4 mg/dL 9.5 9.5 9.9  Total Protein 6.4 - 8.3 g/dL 7.7 7.7 8.1  Total Bilirubin 0.20 - 1.20 mg/dL 1.39(H) 2.00(H) 1.70(H)  Alkaline Phos 40 - 150 U/L 71 66 77  AST 5 - 34 U/L _1 ALT 0 - 55 U/L _2 CEA  01/23/2015: 5.7 06/05/2015: 6.1 08/04/2015: 10.3 10/12/2015: 13.29 (in-house)  11/04/2015: 12.83 01/13/2016: 8.57 02/24/2016: 8.41  PATHOLOGY REPORT: Diagnosis 01/23/2015 Colon, segmental resection, with terminal ileum and cecum - INVASIVE ADENOCARCINOMA EXTENDING INTO PERICECAL CONNECTIVE TISSUE AND INVOLVING SMALL INTESTINE. - THREE BENIGN LYMPH NODES (0/3). - SEE ONCOLOGY TABLE BELOW.  Microscopic Comment COLON AND RECTUM (INCLUDING TRANS-ANAL RESECTION): Specimen: Terminal ileum and cecum. Procedure: Resection. Tumor site: Cecum adjacent to appendix. Specimen integrity: Intact. Macroscopic intactness of mesorectum: Not applicable. Macroscopic tumor perforation: No. Invasive tumor: Maximum size: 4.0 cm. Histologic type(s): Colorectal adenocarcinoma. Histologic grade and differentiation: G2: moderately differentiated/low grade. Type of polyp in which invasive carcinoma arose: No residual polyp. Microscopic  extension of invasive tumor: Into pericecal connective tissue and terminal ileum. Lymph-Vascular invasion: Not identified. Peri-neural invasion: Not identified. Tumor deposit(s) (discontinuous extramural extension): No. Resection margins: Proximal margin: Free of tumor. Distal margin: Free of tumor. Circumferential (radial) (posterior ascending, posterior descending; lateral and posterior mid-rectum; and entire lower 1/3 rectum): Free of tumor. Mesenteric margin (sigmoid and transverse): N/A. Distance closest margin (if all above margins negative): N/A. Treatment effect (neo-adjuvant therapy): No. Additional polyp(s): No. Non-neoplastic findings: N/A. Lymph nodes: number examined 3; number positive: 0. Pathologic Staging: pT4b, pN0, pMX. Ancillary studies: Mismatch repair protein by immunohistochemistry pending. Comments: There is a colorectal-type adenocarcinoma arising in the cecum in the area of the appendiceal orifice. The tumor extends into the pericecal connective tissue, and involves the attached segment of terminal ileum. Immunohistochemistry shows the tumor is positive with CDX2 and cytokeratin 20, and negative with cytokeratin 7, estrogen receptor and WT-1 supporting a diagnosis of primary colorectal adenocarcinoma. ADDITIONAL INFORMATION: Mismatch Repair (MMR) Protein Immunohistochemistry (IHC) IHC Expression Result: MLH1: Preserved nuclear expression (greater 50% tumor expression) MSH2: Preserved nuclear expression (greater 50% tumor expression) MSH6: Preserved nuclear expression (greater 50% tumor expression) PMS2: Preserved nuclear expression (greater 50% tumor expression) * Internal control demonstrates intact nuclear expression Interpretation: NORMAL There is preserved expression of the major and minor MMR proteins. There is a very low probability that microsatellite instability (MSI) is present. However, certain clinically significant MMR protein mutations may result  in preservation of nuclear expression. It is recommended that the preservation of protein expression be correlated with molecular based MSI testing.    RADIOGRAPHIC STUDIES: I have personally reviewed the radiological images as listed and agreed with the findings in the report.  Ct chest, Abdomen Pelvis Wo Contrast 01/08/2016  IMPRESSION: 1. 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 2. Hepatic and renal cysts. 3.  Aortoiliac atherosclerotic vascular disease. 4. Lumbar spondylosis and degenerative disc disease with multilevel impingement.   ASSESSMENT & PLAN: 75 y.o. African-American female, with past medical history of moderate  dementia, independent with ADLs, hypertension, presents with small bowel obstruction and weight loss.  1. 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 -I personally reviewed her recent PET scan images, unfortunately the scan showed hypermetabolic peritoneal and liver metastases. Given her elevated CEA, normal CA125, this is most consistent with recurrent colon cancer -We discussed is that her prostatic colon cancer is not curable at this stage, and the treatment goal is palliative. -She has started Xeloda 1566m bid, 2 weeks on and one week off. -I reviewed her [facility] one genomic testing as always patient and her daughter, her tumor has K-ras mutation, she is not a candidate for EGFR inhibitor. The tumor has MSI stable, she is not a candidate for immunotherapy, although she may be a candidate [facility] -she is tolerating Xeloda and Avastin well -I reviewed her  restaging CT scan from 02/04/2016, which showed overall stable disease, no new lesions. We'll continue her chemotherapy. -lab results reviewed, she has slightly worse elevated bilirubin, better today, renal function stable, we'll continue monitoring. -plan to repeat staging scan in early Feb if clinically stable   2. Anemia -resolved, Hb 12.7 today, we'll continue monitoring  3. CKD stage III, right hydroureter -Her Cr was around 1 in 08/2013, Cr increased to 2.4 on 01/20/2015 when she was diagnosed with colon cancer due to dehydration, improved some and has been around 1.3-1.4 lately.  -Her recent CT scan showed right hydroureter, which probably contributes to her renal insufficiency. -she had ureter stent placement again in 12/2015  4. HTN, dementia -She will continue her medication and follow-up with her primary care physician. -Her BP has been slightly elevated lately, we discussed that Avastin can cause hypertension, we'll continue monitoring closely.  5. Hyperbilirubinemia -probably related to liver metastasis, vs Xeloda  -better today, tbili 1.39 today  -We'll continue monitoring closely. It's slightly worse today   Plan -lab reviewed, will proceed cycle 5 Xeloda and continue Avastin 7.568mkg every 3 weeks today  -I will see her back in 3 weeks before chemo   All questions were answered. The patient knows to call the clinic with any problems, questions or concerns.  I spent 20 minutes counseling the patient face to face. The total time spent in the appointment was 25 minutes and more than 50% was on counseling.     FeTruitt MerleMD 02/24/2016

## 2016-02-27 ENCOUNTER — Other Ambulatory Visit: Payer: Self-pay | Admitting: Neurology

## 2016-02-29 ENCOUNTER — Encounter: Payer: Self-pay | Admitting: Hematology

## 2016-03-01 NOTE — Telephone Encounter (Signed)
RX sent to Pharmacy, per last OV note patient to continue.

## 2016-03-02 MED FILL — CAPECITABINE 500 MG TABLET: 500 | 7 days supply | Qty: 40 | Fill #0

## 2016-03-16 ENCOUNTER — Other Ambulatory Visit: Payer: Self-pay | Admitting: Hematology

## 2016-03-16 ENCOUNTER — Ambulatory Visit (HOSPITAL_BASED_OUTPATIENT_CLINIC_OR_DEPARTMENT_OTHER): Payer: Medicare Other | Admitting: Hematology

## 2016-03-16 ENCOUNTER — Other Ambulatory Visit (HOSPITAL_BASED_OUTPATIENT_CLINIC_OR_DEPARTMENT_OTHER): Payer: Medicare Other

## 2016-03-16 ENCOUNTER — Telehealth: Payer: Self-pay | Admitting: Hematology

## 2016-03-16 ENCOUNTER — Other Ambulatory Visit: Payer: Self-pay | Admitting: *Deleted

## 2016-03-16 ENCOUNTER — Ambulatory Visit (HOSPITAL_BASED_OUTPATIENT_CLINIC_OR_DEPARTMENT_OTHER): Payer: Medicare Other

## 2016-03-16 ENCOUNTER — Encounter: Payer: Self-pay | Admitting: Hematology

## 2016-03-16 VITALS — BP 148/74 | HR 91 | Temp 98.3°F | Resp 18 | Ht 66.0 in | Wt 126.6 lb

## 2016-03-16 DIAGNOSIS — Z5112 Encounter for antineoplastic immunotherapy: Secondary | ICD-10-CM

## 2016-03-16 DIAGNOSIS — C182 Malignant neoplasm of ascending colon: Secondary | ICD-10-CM

## 2016-03-16 DIAGNOSIS — D638 Anemia in other chronic diseases classified elsewhere: Secondary | ICD-10-CM | POA: Diagnosis not present

## 2016-03-16 DIAGNOSIS — Z79899 Other long term (current) drug therapy: Secondary | ICD-10-CM | POA: Diagnosis not present

## 2016-03-16 DIAGNOSIS — C180 Malignant neoplasm of cecum: Secondary | ICD-10-CM

## 2016-03-16 DIAGNOSIS — F0390 Unspecified dementia without behavioral disturbance: Secondary | ICD-10-CM

## 2016-03-16 DIAGNOSIS — N183 Chronic kidney disease, stage 3 (moderate): Secondary | ICD-10-CM

## 2016-03-16 DIAGNOSIS — I10 Essential (primary) hypertension: Secondary | ICD-10-CM

## 2016-03-16 DIAGNOSIS — C787 Secondary malignant neoplasm of liver and intrahepatic bile duct: Secondary | ICD-10-CM

## 2016-03-16 DIAGNOSIS — C7989 Secondary malignant neoplasm of other specified sites: Secondary | ICD-10-CM

## 2016-03-16 LAB — COMPREHENSIVE METABOLIC PANEL
ALT: 6 U/L (ref 0–55)
ANION GAP: 9 meq/L (ref 3–11)
AST: 14 U/L (ref 5–34)
Albumin: 3.5 g/dL (ref 3.5–5.0)
Alkaline Phosphatase: 87 U/L (ref 40–150)
BUN: 16.4 mg/dL (ref 7.0–26.0)
CHLORIDE: 105 meq/L (ref 98–109)
CO2: 26 meq/L (ref 22–29)
Calcium: 9.5 mg/dL (ref 8.4–10.4)
Creatinine: 1.5 mg/dL — ABNORMAL HIGH (ref 0.6–1.1)
EGFR: 41 mL/min/{1.73_m2} — AB (ref >90)
GLUCOSE: 89 mg/dL (ref 70–140)
POTASSIUM: 3.6 meq/L (ref 3.5–5.1)
SODIUM: 140 meq/L (ref 136–145)
TOTAL PROTEIN: 7.5 g/dL (ref 6.4–8.3)
Total Bilirubin: 1.21 mg/dL — ABNORMAL HIGH (ref 0.20–1.20)

## 2016-03-16 LAB — CBC WITH DIFFERENTIAL/PLATELET
BASO%: 0.6 % (ref 0.0–2.0)
Basophils Absolute: 0.1 10*3/uL (ref 0.0–0.1)
EOS ABS: 0.2 10*3/uL (ref 0.0–0.5)
EOS%: 1.7 % (ref 0.0–7.0)
HCT: 36.4 % (ref 34.8–46.6)
HGB: 12.1 g/dL (ref 11.6–15.9)
LYMPH%: 13.5 % — AB (ref 14.0–49.7)
MCH: 33.3 pg (ref 25.1–34.0)
MCHC: 33.1 g/dL (ref 31.5–36.0)
MCV: 100.5 fL (ref 79.5–101.0)
MONO#: 0.8 10*3/uL (ref 0.1–0.9)
MONO%: 5.8 % (ref 0.0–14.0)
NEUT%: 78.4 % — AB (ref 38.4–76.8)
NEUTROS ABS: 10.8 10*3/uL — AB (ref 1.5–6.5)
PLATELETS: 319 10*3/uL (ref 145–400)
RBC: 3.62 10*6/uL — AB (ref 3.70–5.45)
RDW: 17.6 % — ABNORMAL HIGH (ref 11.2–14.5)
WBC: 13.8 10*3/uL — AB (ref 3.9–10.3)
lymph#: 1.9 10*3/uL (ref 0.9–3.3)

## 2016-03-16 LAB — UA PROTEIN, DIPSTICK - CHCC: Protein, ur: 30 mg/dL

## 2016-03-16 MED ORDER — SODIUM CHLORIDE 0.9 % IV SOLN
Freq: Once | INTRAVENOUS | Status: AC
  Administered 2016-03-16: 13:00:00 via INTRAVENOUS

## 2016-03-16 MED ORDER — SODIUM CHLORIDE 0.9 % IV SOLN
7.5000 mg/kg | Freq: Once | INTRAVENOUS | Status: AC
  Administered 2016-03-16: 425 mg via INTRAVENOUS
  Filled 2016-03-16: qty 16

## 2016-03-16 NOTE — Telephone Encounter (Signed)
GAVE PATIENT AVS REPORT AND APPOINTMENTS FOR January  °

## 2016-03-16 NOTE — Patient Instructions (Signed)
[facility] Discharge Instructions for Patients Receiving Chemotherapy  Today you received the following chemotherapy agents Avastin   To help prevent nausea and vomiting after your treatment, we encourage you to take your nausea medication as directed.    If you develop nausea and vomiting that is not controlled by your nausea medication, call the clinic.   BELOW ARE SYMPTOMS THAT SHOULD BE REPORTED IMMEDIATELY:  *FEVER GREATER THAN 100.5 F  *CHILLS WITH OR WITHOUT FEVER  NAUSEA AND VOMITING THAT IS NOT CONTROLLED WITH YOUR NAUSEA MEDICATION  *UNUSUAL SHORTNESS OF BREATH  *UNUSUAL BRUISING OR BLEEDING  TENDERNESS IN MOUTH AND THROAT WITH OR WITHOUT PRESENCE OF ULCERS  *URINARY PROBLEMS  *BOWEL PROBLEMS  UNUSUAL RASH Items with * indicate a potential emergency and should be followed up as soon as possible.  Feel free to call the clinic you have any questions or concerns. The clinic phone number is [phone].  Please show the CHEMO ALERT CARD at check-in to the Emergency Department and triage nurse.   

## 2016-03-16 NOTE — Progress Notes (Signed)
[place]  [phone] [phone]  Clinic Follow Up Note   Patient Care Team: [name], MD as PCP - General (Family Medicine) [name], MD as Consulting Physician (Medical Oncology) [name], MD as Consulting Physician (General Surgery) [name], MD as Consulting Physician (Orthopedic Surgery) Cleon Gustin, MD as Consulting Physician (Urology) 03/16/2016     CHIEF COMPLAINTS:  Follow up metastatic colon cancer  Oncology History   Cancer of right colon [facility])   Staging form: Colon and Rectum, AJCC 7th Edition     Pathologic stage from 01/23/2015: Stage IIC (T4b, N0, cM0) - Signed by [name], MD on 02/05/2015       Cancer of right colon ([place])   01/21/2015 Imaging     CT abdomen and pelvis showed high-grade small bowel obstruction,  right I [place] ureteral nephrosis,  multiple liver cysts.  CT chest was negative for metastatic lesion.      01/23/2015 Pathology Results     invasive adenocarcinoma extending into the pericecal connective tissue,  grade 2, LVI (-),  no perineural invasion, 3 lymph nodes were negative.      01/23/2015 Surgery      terminal ileum and cecum seegmental resection with anastomosis by Dr. Hassell Done      01/23/2015 Initial Diagnosis    Cancer of right colon ([place])      03/13/2015 - 07/06/2015 Chemotherapy    Xeloda 1500 mg bid X 14 days on-7 off, s/p 5 cycles, stopped early due to wrosening renal function      10/28/2015 Progression    PET scan showed multiple hypermetabolic peritoneal nodules, right ovarian cystic mass, and hypermetabolic liver lesion, highly suspicious for metastatic colon cancer. Small pulmonary nodules are indeterminate.       11/11/2015 -  Chemotherapy    Xeloda '1500mg'$  bid, 2 weeks on and one week off, Avastin was added on from cycle 3        HISTORY OF PRESENTING ILLNESS (02/05/2015):  Beth Wilkins 76 y.o. female  with past medical history of moderate dementia,  hypertension, is here because of recently diagnosed stage II colon cancer. She is accompanied to the clinic by her daughter. History of manic is from the chart and her daughter.  She has had abdominal discomfort, nausea and vomiting, and norexia for  the past to 3 months along with 15 pounds weight loss, . Her nausea was mild and intermittent, she does not seek medical attention initially. She presented with  worsening symptoms for 2-3 weeks, and episodes of projectile vomiting and dehydration to emergency room, was admitted to [facility] on 01/20/2015. CT scan reviewed small bowel obstruction, and possible appendiceal rupture. She was brought to OR on 01/23/2015, underwent right hemicolectomy with anastomosis by Dr. Hassell Done. Surgical path reviewed adenocarcinoma at the cecum. She was discharged home on 01/26/2015.  She has recovered well, eating much better than prior to surgery, move around without restriction. No pain, she has loose BM 3 times a day, and she has backed off her mirealax.  she lives with her daughter. She states her appetite is moderate, sometime low, and she refuses to eat if she doesn't have appetite. No nausea, vomiting, abdominal bloating since her surgery.  Her last colonoscopy was 6-8 years ago, and her stool OB was negative this year.   CURRENT THERAPY: Xeloda '1500mg'$  bid, 2 weeks on and one week off, started on 11/11/2015  INTERIM HISTORY: Beth Wilkins returns for follow-up. She is accompanied by her  daughter to clinic today. She is doing well overall. She denies any significant pain, or other side effects from Xeloda. She has been drinking well. No other new complaints.  MEDICAL HISTORY:  Past Medical History:  Diagnosis Date  . Anxiety   . Arthritis   . Bilateral renal cysts   . Centrilobular emphysema ([place])   . Chronic low back pain   . CKD (chronic kidney disease), stage III   . Colon cancer [facility]) oncologist-  dr [name]   dx 01-23-2015  right colon Invasive adenocarcinoma  into pericecal connective tissue and involving small intestine , Stage IIc, Grade 2, LVI(-),  (pT4b N0 M0) /  s/p resection termial ileum and cecum and chemotherapy 01-06-217 to 07-06-2015/   08/ 2017  per PET  recurrent w/ liver mets and peritoneal carcinomatosis  . Complication of anesthesia    can be combative due to dementia  . Full dentures   . History of gastroesophageal reflux (GERD)    changes diet  . History of pelvic fracture   . Hydronephrosis, right   . Hyperlipidemia   . Hypertension   . Liver metastasis ([place])   . 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 Pulmonary nodules   . Right ovarian cyst     SURGICAL HISTORY: Past Surgical History:  Procedure Laterality Date  . CATARACT EXTRACTION W/ INTRAOCULAR LENS  IMPLANT, BILATERAL  2005 approx  . CYSTOSCOPY W/ URETERAL STENT PLACEMENT Right 12/03/2015   Procedure: CYSTOSCOPY WITH RETROGRADE PYELOGRAM/URETERAL STENT PLACEMENT;  Surgeon: Cleon Gustin, MD;  Location: [facility];  Service: Urology;  Laterality: Right;  . CYSTOSCOPY W/ URETERAL STENT PLACEMENT Right 02/15/2016   Procedure: CYSTOSCOPY WITH RETROGRADE PYELOGRAM/URETERAL STENT REPLACEMENT;  Surgeon: Cleon Gustin, MD;  Location: [facility];  Service: Urology;  Laterality: Right;  . CYSTOSCOPY WITH RETROGRADE PYELOGRAM, URETEROSCOPY AND STENT PLACEMENT Right 09/21/2015   Procedure: CYSTOSCOPY WITH BIL RETROGRADE PYELOGRAM, URETEROSCOPY, RIGHT STENT PLACEMENT ;  Surgeon: Cleon Gustin, MD;  Location: [facility];  Service: Urology;  Laterality: Right;  . LAPAROSCOPY N/A 01/23/2015   Procedure: DIAGNOSTIC LAPAROSCOPY, EXPLORATORY LAPAROTOMY  RIGHT HEMI-COLECTOMY FOR OBSTRUCTION OF RIGHT TERMINAL ILEUM;  Surgeon: [name], MD;  Location: [facility];  Service: General;  Laterality: N/A;    SOCIAL HISTORY: Social History   Social  History  . Marital status: Single    Spouse name: N/A  . Number of children: N/A  . Years of education: N/A   Occupational History  . Not on file.   Social History Main Topics  . Smoking status: Former Smoker    Packs/day: 0.25    Years: 50.00    Types: Cigarettes    Quit date: 01/25/2015  . Smokeless tobacco: Never Used  . Alcohol use 1.8 oz/week    2 Glasses of wine, 1 Cans of beer per week     Comment: occasional   . Drug use: No  . Sexual activity: Not on file   Other Topics Concern  . Not on file   Social History Narrative   Legally separated   Lives w/daughter, Beth Wilkins   Has total of #2 daughters and #1 son   Dementia   Fall Risk-ambulates w/cane    FAMILY HISTORY: Family History  Problem Relation Age of Onset  . Diabetes Mellitus II Father   . Cancer Mother 47    ovarian cancer   .  Cervical cancer Other     ALLERGIES:  is allergic to namenda [memantine hcl] and oxycodone.  MEDICATIONS:  Current Outpatient Prescriptions  Medication Sig Dispense Refill  . amLODipine (NORVASC) 5 MG tablet Take 5 mg by mouth every morning.     Marland Kitchen antiseptic oral rinse (BIOTENE) LIQD 15 mLs by Mouth Rinse route daily. BIOTENE CLINICAL    . capecitabine (XELODA) 500 MG tablet Take 3 tablets (1,500 mg total) by mouth 2 (two) times daily after a meal. 40 tablet 0  . Cholecalciferol (VITAMIN D3) 2000 UNITS TABS Take by mouth daily.    . Coenzyme Q10 (CO Q 10) 100 MG CAPS Take by mouth daily.    Marland Kitchen donepezil (ARICEPT) 10 MG tablet Take 1 tab daily (Patient taking differently: Take 10 mg by mouth every evening. Take 1 tab daily) 30 tablet 11  . donepezil (ARICEPT) 10 MG tablet TAKE 1 TABLET BY MOUTH EVERY DAY 30 tablet 11  . Multiple Vitamins-Minerals (WOMENS MULTIVITAMIN PO) Take 1 tablet by mouth daily.    . polyethylene glycol (MIRALAX / GLYCOLAX) packet Take 17 g by mouth daily. (Patient taking differently: Take 17 g by mouth daily as needed. ) 14 each 0  . simvastatin  (ZOCOR) 20 MG tablet Take 20 mg by mouth daily at 6 PM.    . traMADol (ULTRAM) 50 MG tablet Take 1 tablet (50 mg total) by mouth every 6 (six) hours as needed. 30 tablet 0   No current facility-administered medications for this visit.     REVIEW OF SYSTEMS:   Constitutional: Denies fevers, chills or abnormal night sweats Eyes: Denies blurriness of vision, double vision or watery eyes Ears, nose, mouth, throat, and face: Denies mucositis or sore throat Respiratory: Denies cough, dyspnea or wheezes Cardiovascular: Denies palpitation, chest discomfort or lower extremity swelling Gastrointestinal:  Denies nausea, heartburn or change in bowel habits Skin: Denies abnormal skin rashes Lymphatics: Denies new lymphadenopathy or easy bruising Neurological:Denies numbness, tingling or new weaknesses Behavioral/Psych: Mood is stable, no new changes  All other systems were reviewed with the patient and are negative.  PHYSICAL EXAMINATION: ECOG PERFORMANCE STATUS: 1 - Symptomatic but completely ambulatory  Vitals:   03/16/16 1215  BP: (!) 148/74  Pulse: 91  Resp: 18  Temp: 98.3 F (36.8 C)   Filed Weights   03/16/16 1215  Weight: 126 lb 9.6 oz (57.4 kg)   GENERAL:alert, no distress and comfortable SKIN: skin color, texture, turgor are normal, no rashes or significant lesions EYES: normal, conjunctiva are pink and non-injected, sclera clear OROPHARYNX:no exudate, no erythema and lips, buccal mucosa, and tongue normal  NECK: supple, thyroid normal size, non-tender, without nodularity LYMPH:  no palpable lymphadenopathy in the cervical, axillary or inguinal LUNGS: clear to auscultation and percussion with normal breathing effort HEART: regular rate & rhythm and no murmurs and no lower extremity edema ABDOMEN:abdomen soft, non-tender and normal bowel sounds Musculoskeletal:no cyanosis of digits and no clubbing  PSYCH: alert & oriented x 3 with fluent speech NEURO: no focal motor/sensory  deficits  LABORATORY DATA:  I have reviewed the data as listed CBC Latest Ref Rng & Units 03/16/2016 02/24/2016 02/04/2016  WBC 3.9 - 10.3 10e3/uL 13.8(H) 6.1 8.6  Hemoglobin 11.6 - 15.9 g/dL 12.1 12.7 12.5  Hematocrit 34.8 - 46.6 % 36.4 37.6 37.3  Platelets 145 - 400 10e3/uL 319 232 223    CMP Latest Ref Rng & Units 03/16/2016 02/24/2016 02/04/2016  Glucose 70 - 140 mg/dl 89 63(L) 86  BUN  7.0 - 26.0 mg/dL 16.4 17.9 20.1  Creatinine 0.6 - 1.1 mg/dL 1.5(H) 1.3(H) 1.4(H)  Sodium 136 - 145 mEq/L 140 142 142  Potassium 3.5 - 5.1 mEq/L 3.6 3.8 3.7  Chloride 101 - 111 mmol/L - - -  CO2 22 - 29 mEq/L '26 24 22  '$ Calcium 8.4 - 10.4 mg/dL 9.5 9.5 9.5  Total Protein 6.4 - 8.3 g/dL 7.5 7.7 7.7  Total Bilirubin 0.20 - 1.20 mg/dL 1.21(H) 1.39(H) 2.00(H)  Alkaline Phos 40 - 150 U/L 87 71 66  AST 5 - 34 U/L '14 22 20  '$ ALT 0 - 55 U/L '6 12 9   '$ CEA  01/23/2015: 5.7 06/05/2015: 6.1 08/04/2015: 10.3 10/12/2015: 13.29 (in-house)  11/04/2015: 12.83 01/13/2016: 8.57 02/24/2016: 8.41  PATHOLOGY REPORT: Diagnosis 01/23/2015 Colon, segmental resection, with terminal ileum and cecum - INVASIVE ADENOCARCINOMA EXTENDING INTO PERICECAL CONNECTIVE TISSUE AND INVOLVING SMALL INTESTINE. - THREE BENIGN LYMPH NODES (0/3). - SEE ONCOLOGY TABLE BELOW.  Microscopic Comment COLON AND RECTUM (INCLUDING TRANS-ANAL RESECTION): Specimen: Terminal ileum and cecum. Procedure: Resection. Tumor site: Cecum adjacent to appendix. Specimen integrity: Intact. Macroscopic intactness of mesorectum: Not applicable. Macroscopic tumor perforation: No. Invasive tumor: Maximum size: 4.0 cm. Histologic type(s): Colorectal adenocarcinoma. Histologic grade and differentiation: G2: moderately differentiated/low grade. Type of polyp in which invasive carcinoma arose: No residual polyp. Microscopic extension of invasive tumor: Into pericecal connective tissue and terminal ileum. Lymph-Vascular invasion: Not identified. Peri-neural  invasion: Not identified. Tumor deposit(s) (discontinuous extramural extension): No. Resection margins: Proximal margin: Free of tumor. Distal margin: Free of tumor. Circumferential (radial) (posterior ascending, posterior descending; lateral and posterior mid-rectum; and entire lower 1/3 rectum): Free of tumor. Mesenteric margin (sigmoid and transverse): N/A. Distance closest margin (if all above margins negative): N/A. Treatment effect (neo-adjuvant therapy): No. Additional polyp(s): No. Non-neoplastic findings: N/A. Lymph nodes: number examined 3; number positive: 0. Pathologic Staging: pT4b, pN0, pMX. Ancillary studies: Mismatch repair protein by immunohistochemistry pending. Comments: There is a colorectal-type adenocarcinoma arising in the cecum in the area of the appendiceal orifice. The tumor extends into the pericecal connective tissue, and involves the attached segment of terminal ileum. Immunohistochemistry shows the tumor is positive with CDX2 and cytokeratin 20, and negative with cytokeratin 7, estrogen receptor and WT-1 supporting a diagnosis of primary colorectal adenocarcinoma. ADDITIONAL INFORMATION: Mismatch Repair (MMR) Protein Immunohistochemistry (IHC) IHC Expression Result: MLH1: Preserved nuclear expression (greater 50% tumor expression) MSH2: Preserved nuclear expression (greater 50% tumor expression) MSH6: Preserved nuclear expression (greater 50% tumor expression) PMS2: Preserved nuclear expression (greater 50% tumor expression) * Internal control demonstrates intact nuclear expression Interpretation: NORMAL There is preserved expression of the major and minor MMR proteins. There is a very low probability that microsatellite instability (MSI) is present. However, certain clinically significant MMR protein mutations may result in preservation of nuclear expression. It is recommended that the preservation of protein expression be correlated with molecular  based MSI testing.    RADIOGRAPHIC STUDIES: I have personally reviewed the radiological images as listed and agreed with the findings in the report.  Ct chest, Abdomen Pelvis Wo Contrast 01/08/2016  IMPRESSION: 1. 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 2. Hepatic and renal cysts. 3.  Aortoiliac atherosclerotic vascular disease. 4. Lumbar spondylosis and degenerative disc disease with multilevel impingement.   ASSESSMENT & PLAN: 76 y.o. African-American female, with past medical history of moderate dementia, independent with ADLs, hypertension, presents with small bowel obstruction and weight loss.  1. Right colon adenocarcinoma from  cecum, pT4bN0M0, stage IIc, MMR normal, KRAS mutation (+), recurrent metastatic disease in 10/2015 - she is a high risk stage II colon cancer, I have recommended adjuvant chemotherapy Xeloda  -she completed a total of 5 cycles (out of 8 cycles) Xeloda, stopped early due to her worsening renal function. -I personally reviewed her recent PET scan images, unfortunately the scan showed hypermetabolic peritoneal and liver metastases. Given her elevated CEA, normal CA125, this is most consistent with recurrent colon cancer -We discussed is that her prostatic colon cancer is not curable at this stage, and the treatment goal is palliative. -She has started Xeloda '1500mg'$  bid, 2 weeks on and one week off. -I reviewed her [facility] one genomic testing as always patient and her daughter, her tumor has K-ras mutation, she is not a candidate for EGFR inhibitor. The tumor has MSI stable, she is not a candidate for immunotherapy, although she may be a candidate for clinical trial of immunotherapy plus MEK inhibitor at [facility]. -she is tolerating Xeloda and Avastin well -I reviewed her restaging CT scan from 02/04/2016, which showed overall stable disease, no new lesions. We'll continue her chemotherapy. -lab  results reviewed, she has slightly worse elevated Cr, 1.5 today total bilirubin has slightly dropped 1.2 today, CBC normal, urine protein minimal, we'll proceed next cycle Xeloda and Avastin today.   -plan to repeat staging scan in early Feb if clinically stable   2. Anemia -resolved, Hb 12.1 today, we'll continue monitoring  3. CKD stage III, right hydroureter -Her Cr was around 1 in 08/2013, Cr increased to 2.4 on 01/20/2015 when she was diagnosed with colon cancer due to dehydration, improved some and has been around 1.3-1.5 lately.  -Her recent CT scan showed right hydroureter, which probably contributes to her renal insufficiency. -she had ureter stent placement again in 12/2015  4. HTN, dementia -She will continue her medication and follow-up with her primary care physician. -Her BP has been slightly elevated lately, we discussed that Avastin can cause hypertension, we'll continue monitoring closely.  5. Hyperbilirubinemia -probably related to liver metastasis, vs Xeloda  -better today, tbili 1.2 today  -We'll continue monitoring closely. It's slightly worse today   Plan -lab reviewed, will proceed cycle 6 Xeloda and continue Avastin 7.'5mg'$ /kg every 3 weeks today  -I will see her back in 3 weeks before chemo   All questions were answered. The patient knows to call the clinic with any problems, questions or concerns.  I spent 20 minutes counseling the patient face to face. The total time spent in the appointment was 25 minutes and more than 50% was on counseling.     [name], MD 03/16/2016

## 2016-03-17 ENCOUNTER — Encounter: Payer: Self-pay | Admitting: Hematology

## 2016-03-18 ENCOUNTER — Other Ambulatory Visit: Payer: Self-pay | Admitting: *Deleted

## 2016-03-18 DIAGNOSIS — C182 Malignant neoplasm of ascending colon: Secondary | ICD-10-CM

## 2016-03-18 MED ORDER — CAPECITABINE 500 MG PO TABS: 1000.0000 mg/m2 | ORAL_TABLET | Freq: Two times a day (BID) | ORAL | 2 refills | Status: DC

## 2016-03-18 MED FILL — CAPECITABINE 500 MG TABLET: 500 | 14 days supply | Qty: 84 | Fill #0

## 2016-03-21 ENCOUNTER — Other Ambulatory Visit: Payer: Self-pay | Admitting: Urology

## 2016-03-24 ENCOUNTER — Encounter (HOSPITAL_BASED_OUTPATIENT_CLINIC_OR_DEPARTMENT_OTHER): Payer: Self-pay | Admitting: *Deleted

## 2016-03-25 ENCOUNTER — Encounter (HOSPITAL_BASED_OUTPATIENT_CLINIC_OR_DEPARTMENT_OTHER): Payer: Self-pay | Admitting: *Deleted

## 2016-03-25 NOTE — Progress Notes (Signed)
SPOKE W/ DAUGHTER, VANESSA (WHOM IS PT'S HPOA).  PT HAS DEMENTIA.  NPO AFTER MN WITH EXCEPTION CLEAR LIQUIDS UNTIL 0830 (NO CREAM / MILK PRODUCTS).  ARRIVE AT [id number].  CURRENT LAB RESULTS AND EKG IN CHART AND EPIC.  WILL TAKE NORVASC AM DOS W/ SIPS OF WATER.

## 2016-03-28 ENCOUNTER — Encounter (HOSPITAL_BASED_OUTPATIENT_CLINIC_OR_DEPARTMENT_OTHER)
Admission: RE | Disposition: A | Discharge status: Home / Self Care | Payer: Self-pay | Source: Ambulatory Visit | Attending: Urology

## 2016-03-28 ENCOUNTER — Ambulatory Visit (HOSPITAL_BASED_OUTPATIENT_CLINIC_OR_DEPARTMENT_OTHER)
Admission: RE | Admit: 2016-03-28 | Discharge: 2016-03-28 | Disposition: A | Discharge status: Home / Self Care | Payer: Medicare Other | Source: Ambulatory Visit | Attending: Urology | Admitting: Urology

## 2016-03-28 ENCOUNTER — Ambulatory Visit (HOSPITAL_BASED_OUTPATIENT_CLINIC_OR_DEPARTMENT_OTHER): Payer: Medicare Other | Admitting: Anesthesiology

## 2016-03-28 ENCOUNTER — Encounter (HOSPITAL_BASED_OUTPATIENT_CLINIC_OR_DEPARTMENT_OTHER): Payer: Self-pay | Admitting: Anesthesiology

## 2016-03-28 DIAGNOSIS — G8929 Other chronic pain: Secondary | ICD-10-CM | POA: Insufficient documentation

## 2016-03-28 DIAGNOSIS — Z85068 Personal history of other malignant neoplasm of small intestine: Secondary | ICD-10-CM | POA: Diagnosis not present

## 2016-03-28 DIAGNOSIS — K219 Gastro-esophageal reflux disease without esophagitis: Secondary | ICD-10-CM | POA: Diagnosis not present

## 2016-03-28 DIAGNOSIS — M545 Low back pain: Secondary | ICD-10-CM | POA: Insufficient documentation

## 2016-03-28 DIAGNOSIS — Z85038 Personal history of other malignant neoplasm of large intestine: Secondary | ICD-10-CM | POA: Insufficient documentation

## 2016-03-28 DIAGNOSIS — Y838 Other surgical procedures as the cause of abnormal reaction of the patient, or of later complication, without mention of misadventure at the time of the procedure: Secondary | ICD-10-CM | POA: Insufficient documentation

## 2016-03-28 DIAGNOSIS — J449 Chronic obstructive pulmonary disease, unspecified: Secondary | ICD-10-CM | POA: Diagnosis not present

## 2016-03-28 DIAGNOSIS — T8389XA Other specified complication of genitourinary prosthetic devices, implants and grafts, initial encounter: Secondary | ICD-10-CM | POA: Insufficient documentation

## 2016-03-28 DIAGNOSIS — Z87891 Personal history of nicotine dependence: Secondary | ICD-10-CM | POA: Diagnosis not present

## 2016-03-28 DIAGNOSIS — F0390 Unspecified dementia without behavioral disturbance: Secondary | ICD-10-CM | POA: Diagnosis not present

## 2016-03-28 DIAGNOSIS — N1330 Unspecified hydronephrosis: Secondary | ICD-10-CM | POA: Insufficient documentation

## 2016-03-28 DIAGNOSIS — F419 Anxiety disorder, unspecified: Secondary | ICD-10-CM | POA: Insufficient documentation

## 2016-03-28 DIAGNOSIS — I129 Hypertensive chronic kidney disease with stage 1 through stage 4 chronic kidney disease, or unspecified chronic kidney disease: Secondary | ICD-10-CM | POA: Diagnosis not present

## 2016-03-28 DIAGNOSIS — Z9221 Personal history of antineoplastic chemotherapy: Secondary | ICD-10-CM | POA: Insufficient documentation

## 2016-03-28 DIAGNOSIS — N183 Chronic kidney disease, stage 3 (moderate): Secondary | ICD-10-CM | POA: Insufficient documentation

## 2016-03-28 HISTORY — DX: Crossing vessel and stricture of ureter without hydronephrosis: N13.5

## 2016-03-28 HISTORY — PX: CYSTOSCOPY W/ URETERAL STENT PLACEMENT: SHX1429

## 2016-03-28 LAB — POCT HEMOGLOBIN-HEMACUE: HEMOGLOBIN: 12.7 g/dL (ref 12.0–15.0)

## 2016-03-28 SURGERY — CYSTOSCOPY, WITH RETROGRADE PYELOGRAM AND URETERAL STENT INSERTION
Anesthesia: Monitor Anesthesia Care | Site: Renal | Laterality: Right

## 2016-03-28 MED ORDER — FENTANYL CITRATE (PF) 100 MCG/2ML IJ SOLN
25.0000 ug | INTRAMUSCULAR | Status: DC | PRN
  Filled 2016-03-28: qty 1

## 2016-03-28 MED ORDER — IOHEXOL 300 MG/ML  SOLN
INTRAMUSCULAR | Status: DC | PRN
Start: 2016-03-28 — End: 2016-03-28
  Administered 2016-03-28: 5 mL via URETHRAL

## 2016-03-28 MED ORDER — FENTANYL CITRATE (PF) 100 MCG/2ML IJ SOLN
INTRAMUSCULAR | Status: DC | PRN
  Administered 2016-03-28: 25 ug via INTRAVENOUS

## 2016-03-28 MED ORDER — FENTANYL CITRATE (PF) 100 MCG/2ML IJ SOLN
INTRAMUSCULAR | Status: AC
  Filled 2016-03-28: qty 2

## 2016-03-28 MED ORDER — ONDANSETRON HCL 4 MG/2ML IJ SOLN
INTRAMUSCULAR | Status: AC
Start: 2016-03-28 — End: 2016-03-28
  Filled 2016-03-28: qty 2

## 2016-03-28 MED ORDER — ONDANSETRON HCL 4 MG/2ML IJ SOLN
4.0000 mg | Freq: Four times a day (QID) | INTRAMUSCULAR | Status: DC | PRN
  Filled 2016-03-28: qty 2

## 2016-03-28 MED ORDER — ONDANSETRON HCL 4 MG/2ML IJ SOLN
INTRAMUSCULAR | Status: DC | PRN
  Administered 2016-03-28: 4 mg via INTRAVENOUS

## 2016-03-28 MED ORDER — SODIUM CHLORIDE 0.9 % IV SOLN
INTRAVENOUS | Status: DC
  Administered 2016-03-28: 13:00:00 via INTRAVENOUS
  Filled 2016-03-28: qty 1000

## 2016-03-28 MED ORDER — SODIUM CHLORIDE 0.9 % IR SOLN
Status: DC | PRN
  Administered 2016-03-28: 1000 mL via INTRAVESICAL

## 2016-03-28 MED ORDER — LIDOCAINE 2% (20 MG/ML) 5 ML SYRINGE
INTRAMUSCULAR | Status: AC
  Filled 2016-03-28: qty 5

## 2016-03-28 MED ORDER — CEFTRIAXONE SODIUM 2 G IJ SOLR
INTRAMUSCULAR | Status: AC
  Filled 2016-03-28: qty 2

## 2016-03-28 MED ORDER — PROPOFOL 10 MG/ML IV BOLUS
INTRAVENOUS | Status: DC | PRN
  Administered 2016-03-28: 40 mg via INTRAVENOUS
  Administered 2016-03-28: 20 mg via INTRAVENOUS

## 2016-03-28 MED ORDER — DEXTROSE 5 % IV SOLN
INTRAVENOUS | Status: AC
  Filled 2016-03-28: qty 50

## 2016-03-28 MED ORDER — TRAMADOL HCL 50 MG PO TABS: 50.0000 mg | ORAL_TABLET | Freq: Four times a day (QID) | ORAL | 0 refills | Status: DC | PRN

## 2016-03-28 MED ORDER — DEXTROSE 5 % IV SOLN
2.0000 g | INTRAVENOUS | Status: AC
  Administered 2016-03-28: 2 g via INTRAVENOUS
  Filled 2016-03-28: qty 2

## 2016-03-28 MED ORDER — LIDOCAINE 2% (20 MG/ML) 5 ML SYRINGE
INTRAMUSCULAR | Status: DC | PRN
  Administered 2016-03-28: 50 mg via INTRAVENOUS

## 2016-03-28 MED ORDER — PROPOFOL 10 MG/ML IV BOLUS
INTRAVENOUS | Status: AC
  Filled 2016-03-28: qty 20

## 2016-03-28 SURGICAL SUPPLY — 15 items
BAG DRAIN URO-CYSTO SKYTR STRL (DRAIN) ×3 IMPLANT
CATH INTERMIT  6FR 70CM (CATHETERS) ×3 IMPLANT
CLOTH BEACON ORANGE TIMEOUT ST (SAFETY) ×3 IMPLANT
GLOVE BIO SURGEON STRL SZ8 (GLOVE) ×3 IMPLANT
GOWN STRL REUS W/TWL LRG LVL3 (GOWN DISPOSABLE) ×3 IMPLANT
GOWN STRL REUS W/TWL XL LVL3 (GOWN DISPOSABLE) ×3 IMPLANT
GUIDEWIRE ANG ZIPWIRE 038X150 (WIRE) ×3 IMPLANT
GUIDEWIRE STR DUAL SENSOR (WIRE) ×3 IMPLANT
IV NS 1000ML (IV SOLUTION) ×2
IV NS 1000ML BAXH (IV SOLUTION) ×1 IMPLANT
KIT ROOM TURNOVER WOR (KITS) ×3 IMPLANT
MANIFOLD NEPTUNE II (INSTRUMENTS) ×3 IMPLANT
PACK CYSTO (CUSTOM PROCEDURE TRAY) ×3 IMPLANT
STENT CONTOUR 8FR X 24 (STENTS) ×3 IMPLANT
SYRINGE 10CC LL (SYRINGE) ×3 IMPLANT

## 2016-03-28 NOTE — Brief Op Note (Signed)
03/28/2016  3:16 PM  PATIENT:  Beth Wilkins  76 y.o. female  PRE-OPERATIVE DIAGNOSIS:  RIGHT MALIGNANT OBSTRUCTION  POST-OPERATIVE DIAGNOSIS:  Right malignant obstruction  PROCEDURE:  Procedure(s): CYSTOSCOPY WITH RETROGRADE PYELOGRAM/URETERAL STENT EXCHANGE (Right)  SURGEON:  Surgeon(s) and Role:    * Cleon Gustin, MD - Primary  PHYSICIAN ASSISTANT:   ASSISTANTS: none   ANESTHESIA:   MAC  EBL:  Total I/O In: 400 [I.V.:400] Out: -   BLOOD ADMINISTERED:none  DRAINS: right 8x24 JJ ureteral stent   LOCAL MEDICATIONS USED:  NONE  SPECIMEN:  No Specimen  DISPOSITION OF SPECIMEN:  N/A  COUNTS:  YES  TOURNIQUET:  * No tourniquets in log *  DICTATION: .Note written in EPIC  PLAN OF CARE: Discharge to home after PACU  PATIENT DISPOSITION:  PACU - hemodynamically stable.   Delay start of Pharmacological VTE agent (>24hrs) due to surgical blood loss or risk of bleeding: not applicable

## 2016-03-28 NOTE — Anesthesia Preprocedure Evaluation (Addendum)
Anesthesia Evaluation  Patient identified by MRN, date of birth, ID band Patient awake    Reviewed: Allergy & Precautions, H&P , NPO status , Patient's Chart, lab work & pertinent test results  Airway Mallampati: II   Neck ROM: full    Dental   Pulmonary COPD, former smoker,    breath sounds clear to auscultation       Cardiovascular hypertension,  Rhythm:regular Rate:Normal     Neuro/Psych PSYCHIATRIC DISORDERS Anxiety Moderate dementia   GI/Hepatic GERD  ,Colon CA   Endo/Other    Renal/GU Renal InsufficiencyRenal disease     Musculoskeletal  (+) Arthritis ,   Abdominal   Peds  Hematology   Anesthesia Other Findings   Reproductive/Obstetrics                             Anesthesia Physical Anesthesia Plan  ASA: III  Anesthesia Plan: MAC   Post-op Pain Management:    Induction:   Airway Management Planned: Natural Airway  Additional Equipment:   Intra-op Plan:   Post-operative Plan:   Informed Consent: I have reviewed the patients History and Physical, chart, labs and discussed the procedure including the risks, benefits and alternatives for the proposed anesthesia with the patient or authorized representative who has indicated his/her understanding and acceptance.     Plan Discussed with: CRNA, Anesthesiologist and Surgeon  Anesthesia Plan Comments:        Anesthesia Quick Evaluation

## 2016-03-28 NOTE — Discharge Instructions (Signed)

## 2016-03-28 NOTE — H&P (Signed)
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 Her current stent is nonfunctioning and she is here for upsizing of her stent  Past Medical History:  Diagnosis Date  . Anxiety   . Arthritis   . Bilateral renal cysts   . Centrilobular emphysema ([place])   . Chronic low back pain   . CKD (chronic kidney disease), stage III   . 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 Complication of anesthesia    can be combative due to dementia  . Full dentures   . History of gastroesophageal reflux (GERD)    changes diet  . History of pelvic fracture   . Hyperlipidemia   . Hypertension   . Liver metastasis ([place])   . 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 Pulmonary nodules   . Right ovarian cyst   . 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 CATARACT EXTRACTION W/ INTRAOCULAR LENS  IMPLANT, BILATERAL  2005 approx  . CYSTOSCOPY W/ URETERAL STENT PLACEMENT Right 12/03/2015   Procedure: CYSTOSCOPY WITH RETROGRADE PYELOGRAM/URETERAL STENT PLACEMENT;  Surgeon: Cleon Gustin, MD;  Location[facility];  Service: Urology;  Laterality: Right;  . CYSTOSCOPY W/ URETERAL STENT PLACEMENT Right 02/15/2016   Procedure: CYSTOSCOPY WITH RETROGRADE PYELOGRAM/URETERAL STENT REPLACEMENT;  Surgeon: Cleon Gustin, MD;  Location: [facility];  Service: Urology;  Laterality: Right;  . CYSTOSCOPY WITH RETROGRADE PYELOGRAM, URETEROSCOPY AND STENT PLACEMENT Right 09/21/2015   Procedure: CYSTOSCOPY WITH BIL RETROGRADE PYELOGRAM, URETEROSCOPY, RIGHT STENT PLACEMENT ;  Surgeon: Cleon Gustin, MD;  Location: [facility];  Service: Urology;  Laterality: Right;  . LAPAROSCOPY N/A 01/23/2015   Procedure: DIAGNOSTIC LAPAROSCOPY, EXPLORATORY LAPAROTOMY  RIGHT HEMI-COLECTOMY FOR OBSTRUCTION OF RIGHT TERMINAL ILEUM;  Surgeon: [name], MD;  Location: [facility];  Service: General;  Laterality: N/A;    Home Medications:  Prescriptions Prior to Admission  Medication Sig Dispense Refill Last Dose  . amLODipine (NORVASC) 5 MG tablet Take 5 mg by mouth every morning.    03/28/2016 at 1100  . antiseptic oral rinse (BIOTENE) LIQD 15 mLs by Mouth Rinse route daily. [place] [facility]   03/27/2016 at Unknown time  . capecitabine (XELODA) 500 MG tablet Take 3 tablets (1,500 mg total) by mouth 2 (two) times daily after a meal. Take 1500 mg by mouth 2 times daily for  14 days on ;  7 days off. 84 tablet 2 03/27/2016 at Unknown time  . Cholecalciferol (VITAMIN D3) 2000 UNITS TABS Take by mouth daily.   03/27/2016 at Unknown time  . Coenzyme Q10 (CO Q 10) 100 MG CAPS Take by mouth daily.   03/27/2016 at Unknown time  . donepezil (ARICEPT) 10 MG tablet TAKE 1 TABLET BY MOUTH EVERY DAY 30 tablet 11 03/27/2016 at Unknown time  . Multiple  Vitamins-Minerals (WOMENS MULTIVITAMIN PO) Take 1 tablet by mouth daily.   03/27/2016 at Unknown time  . polyethylene glycol (MIRALAX / GLYCOLAX) packet Take 17 g by mouth daily. (Patient taking differently: Take 17 g by mouth daily as needed. ) 14 each 0 Taking  . simvastatin (ZOCOR) 20 MG tablet Take 20 mg by mouth daily at 6 PM.   03/27/2016 at Unknown time   Allergies:  Allergies  Allergen Reactions  . Namenda [Memantine Hcl] Other (See Comments)     hallucinations  . Oxycodone Other (See Comments)    Hallucinations     Family History  Problem Relation Age of Onset  . Diabetes Mellitus II Father   . Cancer Mother 38    ovarian cancer   . Cervical cancer Other    Social History:  reports that she quit smoking about 14 months ago. Her smoking use included Cigarettes. She has a 12.50 pack-year smoking history. She has never used smokeless tobacco. She reports that she drinks about 1.8 oz of alcohol per week . She reports that she does not use drugs.  Review of Systems  All other systems reviewed and are negative.   Physical Exam:  Vital signs in last 24 hours: Temp:  [98.2 F (36.8 C)] 98.2 F (36.8 C) (01/22 1230) Pulse Rate:  [68] 68 (01/22 1230) Resp:  [18] 18 (01/22 1230) BP: (172)/(76) 172/76 (01/22 1230) SpO2:  [100 %] 100 % (01/22 1230) Weight:  [55.8 kg (123 lb)] 55.8 kg (123 lb) (01/22 1230) Physical Exam  Constitutional: She is oriented to person, place, and time. She appears well-developed and well-nourished.  HENT:  Head: Normocephalic and atraumatic.  Eyes: EOM are normal. Pupils are equal, round, and reactive to light.  Neck: Normal range of motion. No thyromegaly present.  Cardiovascular: Normal rate and regular rhythm.   Respiratory: Effort normal. No respiratory distress.  GI: Soft. She exhibits no distension.  Musculoskeletal: Normal range of motion. She exhibits no edema.  Neurological: She is alert and oriented to person, place, and time.  Skin: Skin is warm and dry.  Psychiatric: She has a normal mood and affect. Her behavior is normal. Judgment and thought content normal.    Laboratory Data:  Results for orders placed or performed during the hospital encounter of 03/28/16 (from the past 24 hour(s))  Hemoglobin-hemacue, POC     Status: None   Collection Time: 03/28/16  1:31 PM  Result Value Ref Range   Hemoglobin 12.7 12.0 - 15.0 g/dL   No results found for this or any previous visit (from the  past 240 hour(s)). Creatinine: No results for input(s): CREATININE in the last 168 hours. Baseline Creatinine: 1.2  Impression/Assessment:  75yo with right malignant hydronephrosis and nonfunctioning right ureteral stent  Plan:  The risks/benefits/alternatives to right ureteral stent exchange was explained to the patient and daughter and they understand and wish to proceed with surgery  [name] 03/28/2016, 2:45 PM

## 2016-03-28 NOTE — Transfer of Care (Signed)
Immediate Anesthesia Transfer of Care Note  Patient: Beth Wilkins  Procedure(s) Performed: Procedure(s): CYSTOSCOPY WITH RETROGRADE PYELOGRAM/URETERAL STENT EXCHANGE (Right)  Patient Location: PACU  Anesthesia Type:MAC  Level of Consciousness: awake and alert   Airway & Oxygen Therapy: Patient Spontanous Breathing  Post-op Assessment: Report given to RN and Post -op Vital signs reviewed and stable  Post vital signs: Reviewed and stable  Last Vitals: 139/69, 80, 12, 100% Vitals:   03/28/16 1230  BP: (!) 172/76  Pulse: 68  Resp: 18  Temp: 36.8 C    Last Pain:  Vitals:   03/28/16 1230  TempSrc: Oral      Patients Stated Pain Goal: 5 (82/41/75 3010)  Complications: No apparent anesthesia complications

## 2016-03-28 NOTE — Anesthesia Procedure Notes (Signed)
Procedure Name: MAC Date/Time: 03/28/2016 3:08 PM Performed by: Bethena Roys T Oxygen Delivery Method: Simple face mask

## 2016-03-29 ENCOUNTER — Encounter (HOSPITAL_BASED_OUTPATIENT_CLINIC_OR_DEPARTMENT_OTHER): Payer: Self-pay | Admitting: Urology

## 2016-03-29 NOTE — Anesthesia Postprocedure Evaluation (Addendum)
Anesthesia Post Note  Patient: Beth Wilkins  Procedure(s) Performed: Procedure(s) (LRB): CYSTOSCOPY WITH RETROGRADE PYELOGRAM/URETERAL STENT EXCHANGE (Right)  Patient location during evaluation: PACU Anesthesia Type: MAC Level of consciousness: awake and alert Pain management: pain level controlled Vital Signs Assessment: post-procedure vital signs reviewed and stable Respiratory status: spontaneous breathing and respiratory function stable Cardiovascular status: stable Anesthetic complications: no       Last Vitals:  Vitals:   03/28/16 1600 03/28/16 1616  BP: (!) 159/86 (!) 167/86  Pulse: 71 84  Resp: 11 15  Temp:  (!) 35.7 C    Last Pain:  Vitals:   03/28/16 1616  TempSrc:   PainSc: 0-No pain                 [name] DANIEL

## 2016-03-31 NOTE — Op Note (Signed)
.  Preoperative diagnosis: right malignant obstruction, nonfunctioning stent  Postoperative diagnosis: Same  Procedure: 1 cystoscopy 2. right retrograde pyelography 3.  Intraoperative fluoroscopy, under one hour, with interpretation 4. right 8 x 24 JJ stent exchange  Attending: [name]  Anesthesia: General  Estimated blood loss: None  Drains: Right 8 x 24 JJ ureteral stent without tether  Specimens: none  Antibiotics: ancef  Findings:right mid ureteral narrowing. Severe hydronephrosis. No masses/lesions in the bladder. Ureteral orifices in normal anatomic location.  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 Her current stent is nonfunctioning. After discussing treatment options, they decided proceed with stent exchange.  Procedure her in detail: The patient was brought to the operating room and a brief timeout was done to ensure correct patient, correct procedure, correct site.  General anesthesia was administered patient was placed in dorsal lithotomy position.  Their genitalia was then prepped and draped in usual sterile fashion.  A rigid 21 French cystoscope was passed in the urethra and the bladder.  Bladder was inspected free masses or lesions.  the ureteral orifices were in the normal orthotopic locations. Using a grasper the right stent was brought to the urethral meatus. A sensor wire was advanced through the stent and up to the renal pelvis.  a 6 french ureteral catheter was then instilled into the right ureteral orifice.  a gentle retrograde was obtained and findings noted above.    We then placed a 8 x 24 double-j ureteral stent over the original sensor wire.  We then removed the wire and good coil was noted in the the renal pelvis under fluoroscopy and the bladder under direct vision.  the bladder was then drained and this concluded the procedure which was well tolerated by  patient.  Complications: None  Condition: Stable, extubated, transferred to PACU  Plan: The patient will be discharged home. She will followup in 2 weeks with a renal US

## 2016-04-06 ENCOUNTER — Other Ambulatory Visit (HOSPITAL_BASED_OUTPATIENT_CLINIC_OR_DEPARTMENT_OTHER): Payer: Medicare Other

## 2016-04-06 ENCOUNTER — Encounter: Payer: Self-pay | Admitting: Hematology

## 2016-04-06 ENCOUNTER — Ambulatory Visit (HOSPITAL_BASED_OUTPATIENT_CLINIC_OR_DEPARTMENT_OTHER): Payer: Medicare Other

## 2016-04-06 ENCOUNTER — Telehealth: Payer: Self-pay | Admitting: Hematology

## 2016-04-06 ENCOUNTER — Ambulatory Visit (HOSPITAL_BASED_OUTPATIENT_CLINIC_OR_DEPARTMENT_OTHER): Payer: Medicare Other | Admitting: Hematology

## 2016-04-06 VITALS — BP 131/78 | HR 83

## 2016-04-06 VITALS — BP 141/88 | HR 93 | Temp 97.7°F | Resp 17 | Ht 66.0 in | Wt 126.9 lb

## 2016-04-06 DIAGNOSIS — Z5112 Encounter for antineoplastic immunotherapy: Secondary | ICD-10-CM

## 2016-04-06 DIAGNOSIS — C180 Malignant neoplasm of cecum: Secondary | ICD-10-CM

## 2016-04-06 DIAGNOSIS — I10 Essential (primary) hypertension: Secondary | ICD-10-CM

## 2016-04-06 DIAGNOSIS — Z7189 Other specified counseling: Secondary | ICD-10-CM | POA: Insufficient documentation

## 2016-04-06 DIAGNOSIS — F0390 Unspecified dementia without behavioral disturbance: Secondary | ICD-10-CM

## 2016-04-06 DIAGNOSIS — D638 Anemia in other chronic diseases classified elsewhere: Secondary | ICD-10-CM

## 2016-04-06 DIAGNOSIS — C182 Malignant neoplasm of ascending colon: Secondary | ICD-10-CM

## 2016-04-06 DIAGNOSIS — Z79899 Other long term (current) drug therapy: Secondary | ICD-10-CM

## 2016-04-06 DIAGNOSIS — N183 Chronic kidney disease, stage 3 (moderate): Secondary | ICD-10-CM

## 2016-04-06 LAB — CBC WITH DIFFERENTIAL/PLATELET
BASO%: 1.1 % (ref 0.0–2.0)
BASOS ABS: 0.1 10*3/uL (ref 0.0–0.1)
EOS ABS: 0.4 10*3/uL (ref 0.0–0.5)
EOS%: 6 % (ref 0.0–7.0)
HEMATOCRIT: 39.5 % (ref 34.8–46.6)
HEMOGLOBIN: 13.1 g/dL (ref 11.6–15.9)
LYMPH#: 1.6 10*3/uL (ref 0.9–3.3)
LYMPH%: 22.9 % (ref 14.0–49.7)
MCH: 33.3 pg (ref 25.1–34.0)
MCHC: 33.2 g/dL (ref 31.5–36.0)
MCV: 100.2 fL (ref 79.5–101.0)
MONO#: 0.4 10*3/uL (ref 0.1–0.9)
MONO%: 5.8 % (ref 0.0–14.0)
NEUT#: 4.6 10*3/uL (ref 1.5–6.5)
NEUT%: 64.2 % (ref 38.4–76.8)
Platelets: 271 10*3/uL (ref 145–400)
RBC: 3.94 10*6/uL (ref 3.70–5.45)
RDW: 17.7 % — AB (ref 11.2–14.5)
WBC: 7.1 10*3/uL (ref 3.9–10.3)

## 2016-04-06 LAB — COMPREHENSIVE METABOLIC PANEL
ALBUMIN: 4.1 g/dL (ref 3.5–5.0)
ALK PHOS: 80 U/L (ref 40–150)
ALT: 9 U/L (ref 0–55)
ANION GAP: 12 meq/L — AB (ref 3–11)
AST: 20 U/L (ref 5–34)
BUN: 15.5 mg/dL (ref 7.0–26.0)
CALCIUM: 9.7 mg/dL (ref 8.4–10.4)
CO2: 24 mEq/L (ref 22–29)
Chloride: 105 mEq/L (ref 98–109)
Creatinine: 1.4 mg/dL — ABNORMAL HIGH (ref 0.6–1.1)
EGFR: 45 mL/min/{1.73_m2} — ABNORMAL LOW (ref >90)
Glucose: 67 mg/dl — ABNORMAL LOW (ref 70–140)
POTASSIUM: 3.8 meq/L (ref 3.5–5.1)
Sodium: 140 mEq/L (ref 136–145)
Total Bilirubin: 1.31 mg/dL — ABNORMAL HIGH (ref 0.20–1.20)
Total Protein: 8.5 g/dL — ABNORMAL HIGH (ref 6.4–8.3)

## 2016-04-06 LAB — CEA (IN HOUSE-CHCC): CEA (CHCC-IN HOUSE): 9.24 ng/mL — AB (ref 0.00–5.00)

## 2016-04-06 LAB — UA PROTEIN, DIPSTICK - CHCC: Protein, ur: 30 mg/dL

## 2016-04-06 MED ORDER — CAPECITABINE 500 MG PO TABS: 1000.0000 mg/m2 | ORAL_TABLET | Freq: Two times a day (BID) | ORAL | 2 refills | Status: DC

## 2016-04-06 MED ORDER — CAPECITABINE 500 MG PO TABS: 1000.0000 mg/m2 | ORAL_TABLET | Freq: Two times a day (BID) | ORAL | 0 refills | Status: DC

## 2016-04-06 MED ORDER — SODIUM CHLORIDE 0.9 % IV SOLN
Freq: Once | INTRAVENOUS | Status: AC
  Administered 2016-04-06: 14:00:00 via INTRAVENOUS

## 2016-04-06 MED ORDER — SODIUM CHLORIDE 0.9 % IV SOLN
7.5000 mg/kg | Freq: Once | INTRAVENOUS | Status: AC
  Administered 2016-04-06: 425 mg via INTRAVENOUS
  Filled 2016-04-06: qty 1

## 2016-04-06 MED FILL — CAPECITABINE 500 MG TABLET: 500 | 14 days supply | Qty: 75 | Fill #0

## 2016-04-06 NOTE — Patient Instructions (Signed)
[facility] Discharge Instructions for Patients Receiving Chemotherapy  Today you received the following chemotherapy agents Avastin   To help prevent nausea and vomiting after your treatment, we encourage you to take your nausea medication as directed.    If you develop nausea and vomiting that is not controlled by your nausea medication, call the clinic.   BELOW ARE SYMPTOMS THAT SHOULD BE REPORTED IMMEDIATELY:  *FEVER GREATER THAN 100.5 F  *CHILLS WITH OR WITHOUT FEVER  NAUSEA AND VOMITING THAT IS NOT CONTROLLED WITH YOUR NAUSEA MEDICATION  *UNUSUAL SHORTNESS OF BREATH  *UNUSUAL BRUISING OR BLEEDING  TENDERNESS IN MOUTH AND THROAT WITH OR WITHOUT PRESENCE OF ULCERS  *URINARY PROBLEMS  *BOWEL PROBLEMS  UNUSUAL RASH Items with * indicate a potential emergency and should be followed up as soon as possible.  Feel free to call the clinic you have any questions or concerns. The clinic phone number is [phone].  Please show the CHEMO ALERT CARD at check-in to the Emergency Department and triage nurse.   

## 2016-04-06 NOTE — Telephone Encounter (Signed)
Lab, follow up and chemo was scheduled in 3 weeks (04/27/16), per 04/06/16 los. Patient was given a copy of the AVS report and appointment schedule, per 04/06/16 los.

## 2016-04-06 NOTE — Progress Notes (Signed)
[place]  [phone] [phone]  Clinic Follow Up Note   Patient Care Team: [name], MD as PCP - General (Family Medicine) [name], MD as Consulting Physician (Medical Oncology) [name], MD as Consulting Physician (General Surgery) [name], MD as Consulting Physician (Orthopedic Surgery) Cleon Gustin, MD as Consulting Physician (Urology) 04/06/2016     CHIEF COMPLAINTS:  Follow up metastatic colon cancer  Oncology History   Cancer of right colon [facility])   Staging form: Colon and Rectum, AJCC 7th Edition     Pathologic stage from 01/23/2015: Stage IIC (T4b, N0, cM0) - Signed by [name], MD on 02/05/2015       Cancer of right colon ([place])   01/21/2015 Imaging     CT abdomen and pelvis showed high-grade small bowel obstruction,  right I [place] ureteral nephrosis,  multiple liver cysts.  CT chest was negative for metastatic lesion.      01/23/2015 Pathology Results     invasive adenocarcinoma extending into the pericecal connective tissue,  grade 2, LVI (-),  no perineural invasion, 3 lymph nodes were negative.      01/23/2015 Surgery      terminal ileum and cecum seegmental resection with anastomosis by Dr. Hassell Done      01/23/2015 Initial Diagnosis    Cancer of right colon ([place])      03/13/2015 - 07/06/2015 Chemotherapy    Xeloda 1500 mg bid X 14 days on-7 off, s/p 5 cycles, stopped early due to wrosening renal function      10/28/2015 Progression    PET scan showed multiple hypermetabolic peritoneal nodules, right ovarian cystic mass, and hypermetabolic liver lesion, highly suspicious for metastatic colon cancer. Small pulmonary nodules are indeterminate.       11/11/2015 -  Chemotherapy    Xeloda '1500mg'$  bid, 2 weeks on and one week off, Avastin was added on from cycle 3        HISTORY OF PRESENTING ILLNESS (02/05/2015):  Beth Wilkins 76 y.o. female  with past medical history of moderate dementia,  hypertension, is here because of recently diagnosed stage II colon cancer. She is accompanied to the clinic by her daughter. History of manic is from the chart and her daughter.  She has had abdominal discomfort, nausea and vomiting, and norexia for  the past to 3 months along with 15 pounds weight loss, . Her nausea was mild and intermittent, she does not seek medical attention initially. She presented with  worsening symptoms for 2-3 weeks, and episodes of projectile vomiting and dehydration to emergency room, was admitted to [facility] on 01/20/2015. CT scan reviewed small bowel obstruction, and possible appendiceal rupture. She was brought to OR on 01/23/2015, underwent right hemicolectomy with anastomosis by Dr. Hassell Done. Surgical path reviewed adenocarcinoma at the cecum. She was discharged home on 01/26/2015.  She has recovered well, eating much better than prior to surgery, move around without restriction. No pain, she has loose BM 3 times a day, and she has backed off her mirealax.  she lives with her daughter. She states her appetite is moderate, sometime low, and she refuses to eat if she doesn't have appetite. No nausea, vomiting, abdominal bloating since her surgery.  Her last colonoscopy was 6-8 years ago, and her stool OB was negative this year.   CURRENT THERAPY: Xeloda '1500mg'$  bid, 2 weeks on and one week off, and Avastin every 3 weeks, started on 11/11/2015  INTERIM HISTORY: [name] returns for follow-up.  She is accompanied by her daughter to clinic today. She is doing well overall. She underwent ureter stent placement by Dr. [name] last week, and tolerated the procedure well. She denies any significant pain, or other symptoms. She has been tolerating treatment very well.  MEDICAL HISTORY:  Past Medical History:  Diagnosis Date  . Anxiety   . Arthritis   . Bilateral renal cysts   . Centrilobular emphysema ([place])   . Chronic low back pain   . CKD (chronic kidney disease), stage III     . Colon cancer [facility]) oncologist-  dr [name]   dx 01-23-2015  right colon Invasive adenocarcinoma into pericecal connective tissue and involving small intestine , Stage IIc, Grade 2, LVI(-),  (pT4b N0 M0) /  s/p resection termial ileum and cecum and chemotherapy 01-06-217 to 07-06-2015/   08/ 2017  per PET  recurrent w/ liver mets and peritoneal carcinomatosis  . Complication of anesthesia    can be combative due to dementia  . Full dentures   . History of gastroesophageal reflux (GERD)    changes diet  . History of pelvic fracture   . Hyperlipidemia   . Hypertension   . Liver metastasis ([place])   . 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 Pulmonary nodules   . Right ovarian cyst   . Ureteral obstruction, right urologist-  dr [name]   malignant obstruction right ureter w/ colon cancer -- treated with ureteral stent placement    SURGICAL HISTORY: Past Surgical History:  Procedure Laterality Date  . CATARACT EXTRACTION W/ INTRAOCULAR LENS  IMPLANT, BILATERAL  2005 approx  . CYSTOSCOPY W/ URETERAL STENT PLACEMENT Right 12/03/2015   Procedure: CYSTOSCOPY WITH RETROGRADE PYELOGRAM/URETERAL STENT PLACEMENT;  Surgeon: Cleon Gustin, MD;  Location: [facility];  Service: Urology;  Laterality: Right;  . CYSTOSCOPY W/ URETERAL STENT PLACEMENT Right 02/15/2016   Procedure: CYSTOSCOPY WITH RETROGRADE PYELOGRAM/URETERAL STENT REPLACEMENT;  Surgeon: Cleon Gustin, MD;  Location: [facility];  Service: Urology;  Laterality: Right;  . CYSTOSCOPY W/ URETERAL STENT PLACEMENT Right 03/28/2016   Procedure: CYSTOSCOPY WITH RETROGRADE PYELOGRAM/URETERAL STENT EXCHANGE;  Surgeon: Cleon Gustin, MD;  [facility];  Service: Urology;  Laterality: Right;  . CYSTOSCOPY WITH RETROGRADE PYELOGRAM, URETEROSCOPY AND STENT PLACEMENT Right 09/21/2015   Procedure:  CYSTOSCOPY WITH BIL RETROGRADE PYELOGRAM, URETEROSCOPY, RIGHT STENT PLACEMENT ;  Surgeon: Cleon Gustin, MD;  Location: [facility];  Service: Urology;  Laterality: Right;  . LAPAROSCOPY N/A 01/23/2015   Procedure: DIAGNOSTIC LAPAROSCOPY, EXPLORATORY LAPAROTOMY  RIGHT HEMI-COLECTOMY FOR OBSTRUCTION OF RIGHT TERMINAL ILEUM;  Surgeon: [name], MD;  Location: [facility];  Service: General;  Laterality: N/A;    SOCIAL HISTORY: Social History   Social History  . Marital status: Single    Spouse name: N/A  . Number of children: N/A  . Years of education: N/A   Occupational History  . Not on file.   Social History Main Topics  . Smoking status: Former Smoker    Packs/day: 0.25    Years: 50.00    Types: Cigarettes    Quit date: 01/25/2015  . Smokeless tobacco: Never Used  . Alcohol use 1.8 oz/week    2 Glasses of wine, 1 Cans of beer per week     Comment: occasional   . Drug use: No  . Sexual activity: Not on file  Other Topics Concern  . Not on file   Social History Narrative   Legally separated   Lives w/daughter, Latana Colin   Has total of #2 daughters and #1 son   Dementia   Fall Risk-ambulates w/cane    FAMILY HISTORY: Family History  Problem Relation Age of Onset  . Diabetes Mellitus II Father   . Cancer Mother 3    ovarian cancer   . Cervical cancer Other     ALLERGIES:  is allergic to namenda [memantine hcl] and oxycodone.  MEDICATIONS:  Current Outpatient Prescriptions  Medication Sig Dispense Refill  . amLODipine (NORVASC) 5 MG tablet Take 5 mg by mouth every morning.     Marland Kitchen antiseptic oral rinse (BIOTENE) LIQD 15 mLs by Mouth Rinse route daily. BIOTENE CLINICAL    . capecitabine (XELODA) 500 MG tablet Take 3 tablets (1,500 mg total) by mouth 2 (two) times daily after a meal. Take 1500 mg by mouth 2 times daily for  14 days on ;  7 days off. 75 tablet 0  . Cholecalciferol (VITAMIN D3) 2000 UNITS TABS Take by mouth daily.    .  Coenzyme Q10 (CO Q 10) 100 MG CAPS Take by mouth daily.    Marland Kitchen donepezil (ARICEPT) 10 MG tablet TAKE 1 TABLET BY MOUTH EVERY DAY 30 tablet 11  . Multiple Vitamins-Minerals (WOMENS MULTIVITAMIN PO) Take 1 tablet by mouth daily.    . polyethylene glycol (MIRALAX / GLYCOLAX) packet Take 17 g by mouth daily. (Patient taking differently: Take 17 g by mouth daily as needed. ) 14 each 0  . simvastatin (ZOCOR) 20 MG tablet Take 20 mg by mouth daily at 6 PM.    . traMADol (ULTRAM) 50 MG tablet Take 1 tablet (50 mg total) by mouth every 6 (six) hours as needed. (Patient not taking: Reported on 04/06/2016) 30 tablet 0   No current facility-administered medications for this visit.     REVIEW OF SYSTEMS:   Constitutional: Denies fevers, chills or abnormal night sweats Eyes: Denies blurriness of vision, double vision or watery eyes Ears, nose, mouth, throat, and face: Denies mucositis or sore throat Respiratory: Denies cough, dyspnea or wheezes Cardiovascular: Denies palpitation, chest discomfort or lower extremity swelling Gastrointestinal:  Denies nausea, heartburn or change in bowel habits Skin: Denies abnormal skin rashes Lymphatics: Denies new lymphadenopathy or easy bruising Neurological:Denies numbness, tingling or new weaknesses Behavioral/Psych: Mood is stable, no new changes  All other systems were reviewed with the patient and are negative.  PHYSICAL EXAMINATION: ECOG PERFORMANCE STATUS: 1 - Symptomatic but completely ambulatory  Vitals:   04/06/16 1136  BP: (!) 141/88  Pulse: 93  Resp: 17  Temp: 97.7 F (36.5 C)   Filed Weights   04/06/16 1136  Weight: 126 lb 14.4 oz (57.6 kg)   GENERAL:alert, no distress and comfortable SKIN: skin color, texture, turgor are normal, no rashes or significant lesions EYES: normal, conjunctiva are pink and non-injected, sclera clear OROPHARYNX:no exudate, no erythema and lips, buccal mucosa, and tongue normal  NECK: supple, thyroid normal size,  non-tender, without nodularity LYMPH:  no palpable lymphadenopathy in the cervical, axillary or inguinal LUNGS: clear to auscultation and percussion with normal breathing effort HEART: regular rate & rhythm and no murmurs and no lower extremity edema ABDOMEN:abdomen soft, non-tender and normal bowel sounds Musculoskeletal:no cyanosis of digits and no clubbing  PSYCH: alert & oriented x 3 with fluent speech NEURO: no focal motor/sensory deficits  LABORATORY DATA:  I have reviewed the data  as listed CBC Latest Ref Rng & Units 04/06/2016 03/28/2016 03/16/2016  WBC 3.9 - 10.3 10e3/uL 7.1 - 13.8(H)  Hemoglobin 11.6 - 15.9 g/dL 13.1 12.7 12.1  Hematocrit 34.8 - 46.6 % 39.5 - 36.4  Platelets 145 - 400 10e3/uL 271 - 319    CMP Latest Ref Rng & Units 04/06/2016 03/16/2016 02/24/2016  Glucose 70 - 140 mg/dl 67(L) 89 63(L)  BUN 7.0 - 26.0 mg/dL 15.5 16.4 17.9  Creatinine 0.6 - 1.1 mg/dL 1.4(H) 1.5(H) 1.3(H)  Sodium 136 - 145 mEq/L 140 140 142  Potassium 3.5 - 5.1 mEq/L 3.8 3.6 3.8  Chloride 101 - 111 mmol/L - - -  CO2 22 - 29 mEq/L '24 26 24  '$ Calcium 8.4 - 10.4 mg/dL 9.7 9.5 9.5  Total Protein 6.4 - 8.3 g/dL 8.5(H) 7.5 7.7  Total Bilirubin 0.20 - 1.20 mg/dL 1.31(H) 1.21(H) 1.39(H)  Alkaline Phos 40 - 150 U/L 80 87 71  AST 5 - 34 U/L '20 14 22  '$ ALT 0 - 55 U/L '9 6 12   '$ CEA  01/23/2015: 5.7 06/05/2015: 6.1 08/04/2015: 10.3 10/12/2015: 13.29 (in-house)  11/04/2015: 12.83 01/13/2016: 8.57 02/24/2016: 8.41  PATHOLOGY REPORT: Diagnosis 01/23/2015 Colon, segmental resection, with terminal ileum and cecum - INVASIVE ADENOCARCINOMA EXTENDING INTO PERICECAL CONNECTIVE TISSUE AND INVOLVING SMALL INTESTINE. - THREE BENIGN LYMPH NODES (0/3). - SEE ONCOLOGY TABLE BELOW.  Microscopic Comment COLON AND RECTUM (INCLUDING TRANS-ANAL RESECTION): Specimen: Terminal ileum and cecum. Procedure: Resection. Tumor site: Cecum adjacent to appendix. Specimen integrity: Intact. Macroscopic intactness of  mesorectum: Not applicable. Macroscopic tumor perforation: No. Invasive tumor: Maximum size: 4.0 cm. Histologic type(s): Colorectal adenocarcinoma. Histologic grade and differentiation: G2: moderately differentiated/low grade. Type of polyp in which invasive carcinoma arose: No residual polyp. Microscopic extension of invasive tumor: Into pericecal connective tissue and terminal ileum. Lymph-Vascular invasion: Not identified. Peri-neural invasion: Not identified. Tumor deposit(s) (discontinuous extramural extension): No. Resection margins: Proximal margin: Free of tumor. Distal margin: Free of tumor. Circumferential (radial) (posterior ascending, posterior descending; lateral and posterior mid-rectum; and entire lower 1/3 rectum): Free of tumor. Mesenteric margin (sigmoid and transverse): N/A. Distance closest margin (if all above margins negative): N/A. Treatment effect (neo-adjuvant therapy): No. Additional polyp(s): No. Non-neoplastic findings: N/A. Lymph nodes: number examined 3; number positive: 0. Pathologic Staging: pT4b, pN0, pMX. Ancillary studies: Mismatch repair protein by immunohistochemistry pending. Comments: There is a colorectal-type adenocarcinoma arising in the cecum in the area of the appendiceal orifice. The tumor extends into the pericecal connective tissue, and involves the attached segment of terminal ileum. Immunohistochemistry shows the tumor is positive with CDX2 and cytokeratin 20, and negative with cytokeratin 7, estrogen receptor and WT-1 supporting a diagnosis of primary colorectal adenocarcinoma. ADDITIONAL INFORMATION: Mismatch Repair (MMR) Protein Immunohistochemistry (IHC) IHC Expression Result: MLH1: Preserved nuclear expression (greater 50% tumor expression) MSH2: Preserved nuclear expression (greater 50% tumor expression) MSH6: Preserved nuclear expression (greater 50% tumor expression) PMS2: Preserved nuclear expression (greater 50% tumor  expression) * Internal control demonstrates intact nuclear expression Interpretation: NORMAL There is preserved expression of the major and minor MMR proteins. There is a very low probability that microsatellite instability (MSI) is present. However, certain clinically significant MMR protein mutations may result in preservation of nuclear expression. It is recommended that the preservation of protein expression be correlated with molecular based MSI testing.    RADIOGRAPHIC STUDIES: I have personally reviewed the radiological images as listed and agreed with the findings in the report.  Ct chest, Abdomen Pelvis Wo Contrast 01/08/2016  IMPRESSION: 1. 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 2. Hepatic and renal cysts. 3.  Aortoiliac atherosclerotic vascular disease. 4. Lumbar spondylosis and degenerative disc disease with multilevel impingement.   ASSESSMENT & PLAN: 76 y.o. African-American female, with past medical history of moderate dementia, independent with ADLs, hypertension, presents with small bowel obstruction and weight loss.  1. 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 -I personally reviewed her recent PET scan images, unfortunately the scan showed hypermetabolic peritoneal and liver metastases. Given her elevated CEA, normal CA125, this is most consistent with recurrent colon cancer -We discussed is that her prostatic colon cancer is not curable at this stage, and the treatment goal is palliative. -She has started Xeloda '1500mg'$  bid, 2 weeks on and one week off. -I reviewed her [facility] one genomic testing as always  patient and her daughter, her tumor has K-ras mutation, she is not a candidate for EGFR inhibitor. The tumor has MSI stable, she is not a candidate for immunotherapy, although she may be a candidate for clinical trial of immunotherapy plus MEK inhibitor at [facility]. -she is tolerating Xeloda and Avastin well -I reviewed her restaging CT scan from 01/08/2016, which showed overall stable disease, no new lesions. We'll continue her chemotherapy. -lab results reviewed, she has slightly worse elevated Cr and tbil, overall stable, CBC normal, urine protein minimal, we'll proceed next cycle Xeloda and Avastin today.   -will order restaging CT scan on next visit   2. CKD stage III, right hydroureter -Her Cr was around 1 in 08/2013, Cr increased to 2.4 on 01/20/2015 when she was diagnosed with colon cancer due to dehydration, improved some and has been around 1.3-1.5 lately.  -Her recent CT scan showed right hydroureter, which probably contributes to her renal insufficiency. -she had ureter stent placement again in 12/2015 and 03/2016   3. HTN, dementia -She will continue her medication and follow-up with her primary care physician. -Her BP has been slightly elevated lately, we discussed that Avastin can cause hypertension, we'll continue monitoring closely.  4. Hyperbilirubinemia -probably related to liver metastasis, vs Xeloda  -better today, tbili 1.2 today  -We'll continue monitoring closely. It's stable overall   5. Goal of care discussion  -We again discussed the incurable nature of her cancer, and the overall poor prognosis, especially if she does not have good response to chemotherapy or progress on chemo -The patient understands the goal of care is palliative. -I recommend DNR/DNI, she will think about it    Plan -lab reviewed, will proceed cycle 7 Xeloda and continue Avastin 7.'5mg'$ /kg every 3 weeks today  -I will see her back in 3 weeks before chemo, will order restaging PET on next visit    All questions were answered. The patient knows to call the clinic with any problems, questions or concerns.  I spent 20 minutes counseling the patient face to face. The total time spent in the appointment was 25 minutes and more than 50% was on counseling.     [name], MD 04/06/2016

## 2016-04-25 ENCOUNTER — Other Ambulatory Visit: Payer: Self-pay | Admitting: Urology

## 2016-04-26 NOTE — Progress Notes (Signed)
[place]  [phone]) [phone] Fax:([phone]) [phone]  Clinic Follow Up Note   Patient Care Team: [name], MD as PCP - General (Family Medicine) [name], MD as Consulting Physician (Medical Oncology) [name], MD as Consulting Physician (General Surgery) [name], MD as Consulting Physician (Orthopedic Surgery) Cleon Gustin, MD as Consulting Physician (Urology) 04/27/2016     CHIEF COMPLAINTS:  Follow up metastatic colon cancer  Oncology History   Cancer of right colon [facility])   Staging form: Colon and Rectum, AJCC 7th Edition     Pathologic stage from 01/23/2015: Stage IIC (T4b, N0, cM0) - Signed by [name], MD on 02/05/2015       Cancer of right colon ([place])   01/21/2015 Imaging     CT abdomen and pelvis showed high-grade small bowel obstruction,  right I [place] ureteral nephrosis,  multiple liver cysts.  CT chest was negative for metastatic lesion.      01/23/2015 Pathology Results     invasive adenocarcinoma extending into the pericecal connective tissue,  grade 2, LVI (-),  no perineural invasion, 3 lymph nodes were negative.      01/23/2015 Surgery      terminal ileum and cecum seegmental resection with anastomosis by Dr. Hassell Done      01/23/2015 Initial Diagnosis    Cancer of right colon ([place])      03/13/2015 - 07/06/2015 Chemotherapy    Xeloda 1500 mg bid X 14 days on-7 off, s/p 5 cycles, stopped early due to wrosening renal function      10/28/2015 Progression    PET scan showed multiple hypermetabolic peritoneal nodules, right ovarian cystic mass, and hypermetabolic liver lesion, highly suspicious for metastatic colon cancer. Small pulmonary nodules are indeterminate.       11/11/2015 -  Chemotherapy    Xeloda '1500mg'$  bid, 2 weeks on and one week off, Avastin was added on from cycle 3        HISTORY OF PRESENTING ILLNESS (02/05/2015):  Beth Wilkins 76 y.o. female  with past medical history of moderate dementia,  hypertension, is here because of recently diagnosed stage II colon cancer. She is accompanied to the clinic by her daughter. History of manic is from the chart and her daughter.  She has had abdominal discomfort, nausea and vomiting, and norexia for  the past to 3 months along with 15 pounds weight loss, . Her nausea was mild and intermittent, she does not seek medical attention initially. She presented with  worsening symptoms for 2-3 weeks, and episodes of projectile vomiting and dehydration to emergency room, was admitted to [facility] on 01/20/2015. CT scan reviewed small bowel obstruction, and possible appendiceal rupture. She was brought to OR on 01/23/2015, underwent right hemicolectomy with anastomosis by Dr. Hassell Done. Surgical path reviewed adenocarcinoma at the cecum. She was discharged home on 01/26/2015.  She has recovered well, eating much better than prior to surgery, move around without restriction. No pain, she has loose BM 3 times a day, and she has backed off her mirealax.  she lives with her daughter. She states her appetite is moderate, sometime low, and she refuses to eat if she doesn't have appetite. No nausea, vomiting, abdominal bloating since her surgery.  Her last colonoscopy was 6-8 years ago, and her stool OB was negative this year.   CURRENT THERAPY: Xeloda '1500mg'$  bid, 2 weeks on and one week off, and Avastin every 3 weeks, started on 11/11/2015  INTERIM HISTORY: Ayriana returns for follow-up.  She is doing well overall, has no complains of pain or others. She had ureter stent replaced by her urologist a few weeks ago. She tolerated procedure well. She has been eating and drinking well at home. She has no significant side effects from Xeloda.  MEDICAL HISTORY:  Past Medical History:  Diagnosis Date  . Anxiety   . Arthritis   . Bilateral renal cysts   . Centrilobular emphysema ([place])   . Chronic low back pain   . CKD (chronic kidney disease), stage III   . Colon cancer [facility])  oncologist-  dr [name]   dx 01-23-2015  right colon Invasive adenocarcinoma into pericecal connective tissue and involving small intestine , Stage IIc, Grade 2, LVI(-),  (pT4b N0 M0) /  s/p resection termial ileum and cecum and chemotherapy 01-06-217 to 07-06-2015/   08/ 2017  per PET  recurrent w/ liver mets and peritoneal carcinomatosis  . Complication of anesthesia    can be combative due to dementia  . Full dentures   . History of gastroesophageal reflux (GERD)    changes diet  . History of pelvic fracture   . Hyperlipidemia   . Hypertension   . Liver metastasis ([place])   . 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 Pulmonary nodules   . Right ovarian cyst   . Ureteral obstruction, right urologist-  dr [name]   malignant obstruction right ureter w/ colon cancer -- treated with ureteral stent placement    SURGICAL HISTORY: Past Surgical History:  Procedure Laterality Date  . CATARACT EXTRACTION W/ INTRAOCULAR LENS  IMPLANT, BILATERAL  2005 approx  . CYSTOSCOPY W/ URETERAL STENT PLACEMENT Right 12/03/2015   Procedure: CYSTOSCOPY WITH RETROGRADE PYELOGRAM/URETERAL STENT PLACEMENT;  Surgeon: Cleon Gustin, MD;  Location: [facility];  Service: Urology;  Laterality: Right;  . CYSTOSCOPY W/ URETERAL STENT PLACEMENT Right 02/15/2016   Procedure: CYSTOSCOPY WITH RETROGRADE PYELOGRAM/URETERAL STENT REPLACEMENT;  Surgeon: Cleon Gustin, MD;  Location: [facility];  Service: Urology;  Laterality: Right;  . CYSTOSCOPY W/ URETERAL STENT PLACEMENT Right 03/28/2016   Procedure: CYSTOSCOPY WITH RETROGRADE PYELOGRAM/URETERAL STENT EXCHANGE;  Surgeon: Cleon Gustin, MD;  Location: [facility];  Service: Urology;  Laterality: Right;  . CYSTOSCOPY WITH RETROGRADE PYELOGRAM, URETEROSCOPY AND STENT PLACEMENT Right 09/21/2015   Procedure: CYSTOSCOPY WITH BIL RETROGRADE  PYELOGRAM, URETEROSCOPY, RIGHT STENT PLACEMENT ;  Surgeon: Cleon Gustin, MD;  Location: [facility];  Service: Urology;  Laterality: Right;  . LAPAROSCOPY N/A 01/23/2015   Procedure: DIAGNOSTIC LAPAROSCOPY, EXPLORATORY LAPAROTOMY  RIGHT HEMI-COLECTOMY FOR OBSTRUCTION OF RIGHT TERMINAL ILEUM;  Surgeon: [name], MD;  Location: [facility];  Service: General;  Laterality: N/A;    SOCIAL HISTORY: Social History   Social History  . Marital status: Single    Spouse name: N/A  . Number of children: N/A  . Years of education: N/A   Occupational History  . Not on file.   Social History Main Topics  . Smoking status: Former Smoker    Packs/day: 0.25    Years: 50.00    Types: Cigarettes    Quit date: 01/25/2015  . Smokeless tobacco: Never Used  . Alcohol use 1.8 oz/week    2 Glasses of wine, 1 Cans of beer per week     Comment: occasional   . Drug use: No  . Sexual activity: Not on file  Other Topics Concern  . Not on file   Social History Narrative   Legally separated   Lives w/daughter, Foy Mungia   Has total of #2 daughters and #1 son   Dementia   Fall Risk-ambulates w/cane    FAMILY HISTORY: Family History  Problem Relation Age of Onset  . Diabetes Mellitus II Father   . Cancer Mother 81    ovarian cancer   . Cervical cancer Other     ALLERGIES:  is allergic to namenda [memantine hcl] and oxycodone.  MEDICATIONS:  Current Outpatient Prescriptions  Medication Sig Dispense Refill  . amLODipine (NORVASC) 5 MG tablet Take 5 mg by mouth every morning.     Marland Kitchen antiseptic oral rinse (BIOTENE) LIQD 15 mLs by Mouth Rinse route daily. BIOTENE CLINICAL    . capecitabine (XELODA) 500 MG tablet Take 3 tablets (1,500 mg total) by mouth 2 (two) times daily after a meal. Take 1500 mg by mouth 2 times daily for  14 days on ;  7 days off. 75 tablet 0  . Cholecalciferol (VITAMIN D3) 2000 UNITS TABS Take by mouth daily.    . Coenzyme Q10 (CO Q 10) 100 MG  CAPS Take by mouth daily.    Marland Kitchen donepezil (ARICEPT) 10 MG tablet TAKE 1 TABLET BY MOUTH EVERY DAY 30 tablet 11  . Multiple Vitamins-Minerals (WOMENS MULTIVITAMIN PO) Take 1 tablet by mouth daily.    . polyethylene glycol (MIRALAX / GLYCOLAX) packet Take 17 g by mouth daily. (Patient taking differently: Take 17 g by mouth daily as needed. ) 14 each 0  . simvastatin (ZOCOR) 20 MG tablet Take 20 mg by mouth daily at 6 PM.    . traMADol (ULTRAM) 50 MG tablet Take 1 tablet (50 mg total) by mouth every 6 (six) hours as needed. 30 tablet 0   No current facility-administered medications for this visit.     REVIEW OF SYSTEMS:  Constitutional: Denies fevers, chills or abnormal night sweats Eyes: Denies blurriness of vision, double vision or watery eyes Ears, nose, mouth, throat, and face: Denies mucositis or sore throat Respiratory: Denies cough, dyspnea or wheezes Cardiovascular: Denies palpitation, chest discomfort or lower extremity swelling Gastrointestinal:  Denies nausea, heartburn or change in bowel habits Skin: Denies abnormal skin rashes Lymphatics: Denies new lymphadenopathy or easy bruising Neurological:Denies numbness, tingling or new weaknesses Behavioral/Psych: Mood is stable, no new changes  All other systems were reviewed with the patient and are negative.  PHYSICAL EXAMINATION: ECOG PERFORMANCE STATUS: 1 - Symptomatic but completely ambulatory  Vitals:   04/27/16 1230  BP: (!) 150/82  Pulse: 72  Resp: 18  Temp: 98.6 F (37 C)   Filed Weights   04/27/16 1230  Weight: 128 lb (58.1 kg)   GENERAL:alert, no distress and comfortable SKIN: skin color, texture, turgor are normal, no rashes or significant lesions EYES: normal, conjunctiva are pink and non-injected, sclera clear OROPHARYNX:no exudate, no erythema and lips, buccal mucosa, and tongue normal  NECK: supple, thyroid normal size, non-tender, without nodularity LYMPH:  no palpable lymphadenopathy in the cervical,  axillary or inguinal LUNGS: clear to auscultation and percussion with normal breathing effort HEART: regular rate & rhythm and no murmurs and no lower extremity edema ABDOMEN:abdomen soft, non-tender and normal bowel sounds Musculoskeletal:no cyanosis of digits and no clubbing  PSYCH: alert & oriented x 3 with fluent speech NEURO: no focal motor/sensory deficits  LABORATORY DATA:  I have reviewed the data as listed CBC Latest Ref Rng & Units 04/27/2016  04/06/2016 03/28/2016  WBC 3.9 - 10.3 10e3/uL 6.4 7.1 -  Hemoglobin 11.6 - 15.9 g/dL 12.6 13.1 12.7  Hematocrit 34.8 - 46.6 % 37.7 39.5 -  Platelets 145 - 400 10e3/uL 197 271 -    CMP Latest Ref Rng & Units 04/27/2016 04/06/2016 03/16/2016  Glucose 70 - 140 mg/dl 89 67(L) 89  BUN 7.0 - 26.0 mg/dL 13.9 15.5 16.4  Creatinine 0.6 - 1.1 mg/dL 1.4(H) 1.4(H) 1.5(H)  Sodium 136 - 145 mEq/L 141 140 140  Potassium 3.5 - 5.1 mEq/L 4.4 3.8 3.6  Chloride 101 - 111 mmol/L - - -  CO2 22 - 29 mEq/L '24 24 26  '$ Calcium 8.4 - 10.4 mg/dL 9.7 9.7 9.5  Total Protein 6.4 - 8.3 g/dL 7.5 8.5(H) 7.5  Total Bilirubin 0.20 - 1.20 mg/dL 0.71 1.31(H) 1.21(H)  Alkaline Phos 40 - 150 U/L 75 80 87  AST 5 - 34 U/L '18 20 14  '$ ALT 0 - 55 U/L '9 9 6   '$ CEA  01/23/2015: 5.7 06/05/2015: 6.1 08/04/2015: 10.3 10/12/2015: 13.29 (in-house)  11/04/2015: 12.83 01/13/2016: 8.57 02/24/2016: 8.41 04/27/2016: 9.22   PATHOLOGY REPORT: Diagnosis 01/23/2015 Colon, segmental resection, with terminal ileum and cecum - INVASIVE ADENOCARCINOMA EXTENDING INTO PERICECAL CONNECTIVE TISSUE AND INVOLVING SMALL INTESTINE. - THREE BENIGN LYMPH NODES (0/3). - SEE ONCOLOGY TABLE BELOW.  Microscopic Comment COLON AND RECTUM (INCLUDING TRANS-ANAL RESECTION): Specimen: Terminal ileum and cecum. Procedure: Resection. Tumor site: Cecum adjacent to appendix. Specimen integrity: Intact. Macroscopic intactness of mesorectum: Not applicable. Macroscopic tumor perforation: No. Invasive  tumor: Maximum size: 4.0 cm. Histologic type(s): Colorectal adenocarcinoma. Histologic grade and differentiation: G2: moderately differentiated/low grade. Type of polyp in which invasive carcinoma arose: No residual polyp. Microscopic extension of invasive tumor: Into pericecal connective tissue and terminal ileum. Lymph-Vascular invasion: Not identified. Peri-neural invasion: Not identified. Tumor deposit(s) (discontinuous extramural extension): No. Resection margins: Proximal margin: Free of tumor. Distal margin: Free of tumor. Circumferential (radial) (posterior ascending, posterior descending; lateral and posterior mid-rectum; and entire lower 1/3 rectum): Free of tumor. Mesenteric margin (sigmoid and transverse): N/A. Distance closest margin (if all above margins negative): N/A. Treatment effect (neo-adjuvant therapy): No. Additional polyp(s): No. Non-neoplastic findings: N/A. Lymph nodes: number examined 3; number positive: 0. Pathologic Staging: pT4b, pN0, pMX. Ancillary studies: Mismatch repair protein by immunohistochemistry pending. Comments: There is a colorectal-type adenocarcinoma arising in the cecum in the area of the appendiceal orifice. The tumor extends into the pericecal connective tissue, and involves the attached segment of terminal ileum. Immunohistochemistry shows the tumor is positive with CDX2 and cytokeratin 20, and negative with cytokeratin 7, estrogen receptor and WT-1 supporting a diagnosis of primary colorectal adenocarcinoma. ADDITIONAL INFORMATION: Mismatch Repair (MMR) Protein Immunohistochemistry (IHC) IHC Expression Result: MLH1: Preserved nuclear expression (greater 50% tumor expression) MSH2: Preserved nuclear expression (greater 50% tumor expression) MSH6: Preserved nuclear expression (greater 50% tumor expression) PMS2: Preserved nuclear expression (greater 50% tumor expression) * Internal control demonstrates intact nuclear  expression Interpretation: NORMAL There is preserved expression of the major and minor MMR proteins. There is a very low probability that microsatellite instability (MSI) is present. However, certain clinically significant MMR protein mutations may result in preservation of nuclear expression. It is recommended that the preservation of protein expression be correlated with molecular based MSI testing.    RADIOGRAPHIC STUDIES: I have personally reviewed the radiological images as listed and agreed with the findings in the report.  Ct chest, Abdomen Pelvis Wo Contrast 01/08/2016  IMPRESSION: 1. Essentially stable appearance of  the peritoneal carcinomatosis and right hepatic lobe metastatic lesion. 2. Hepatic and renal cysts. 3.  Aortoiliac atherosclerotic vascular disease. 4. Lumbar spondylosis and degenerative disc disease with multilevel impingement.   ASSESSMENT & PLAN: 76 y.o. African-American female, with past medical history of moderate dementia, independent with ADLs, hypertension, presents with small bowel obstruction and weight loss.  1. 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 -I personally reviewed her recent PET scan images, unfortunately the scan showed hypermetabolic peritoneal and liver metastases. 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 -She has started Xeloda '1500mg'$  bid, 2 weeks on and one week off. -I previously reviewed her [facility] one genomic testing as always patient and her daughter, her tumor has  K-ras mutation, she is not a candidate for EGFR inhibitor. The tumor has MSI stable, she is not a candidate for immunotherapy, although she may be a candidate for clinical trial of immunotherapy plus MEK inhibitor at [facility]. -she is tolerating Xeloda and Avastin well -I previously reviewed her restaging CT scan from 01/08/2016, which showed overall stable disease, no new lesions. We'll continue her chemotherapy. -lab results previously reviewed, she has slightly elevated Cr, overall stable, tbil has came down to normal, CBC normal, urine protein minimal, we'll proceed next cycle Xeloda and Avastin today.   -will order restaging CT scan in 3 weeks   2. CKD stage III, right hydroureter -Her Cr was around 1 in 08/2013, Cr increased to 2.4 on 01/20/2015 when she was diagnosed with colon cancer due to dehydration, improved some and has been around 1.3-1.5 lately.  -Her recent CT scan showed right hydroureter, which probably contributes to her renal insufficiency. -she had ureter stent placement again in 12/2015, 03/2016 and 04/2016  3. HTN, dementia -She will continue her medication and follow-up with her primary care physician. -Her BP has been better controlled lately, we discussed that Avastin can cause hypertension, we'll continue monitoring closely.  4. Hyperbilirubinemia -probably related to liver metastasis, vs Xeloda  -better today, tbili down to normal 0.7  -We'll continue monitoring closely. It's stable overall   5. Goal of care discussion  -We again discussed the incurable nature of her cancer, and the overall poor prognosis, especially if she does not have good response to chemotherapy or progress on chemo -The patient understands the goal of care is palliative. -I recommend DNR/DNI, she will think about it    Plan -lab reviewed, will proceed cycle 7 Xeloda and continue Avastin 7.'5mg'$ /kg every 3 weeks today  -I will see her back in 3 weeks before chemo, with restaging CT before next  visit, due to her CKD, we'll omit IV contrast  All questions were answered. The patient knows to call the clinic with any problems, questions or concerns.  I spent 20 minutes counseling the patient face to face. The total time spent in the appointment was 25 minutes and more than 50% was on counseling.  This document serves as a record of services personally performed by [name], MD. It was created on her behalf by Maryla Morrow, a trained medical  scribe. The creation of this record is based on the scribe's personal observations and the provider's statements to them. This document has been checked and approved by the attending provider.     [name], MD 04/27/2016

## 2016-04-27 ENCOUNTER — Ambulatory Visit (HOSPITAL_BASED_OUTPATIENT_CLINIC_OR_DEPARTMENT_OTHER): Payer: Medicare Other | Admitting: Hematology

## 2016-04-27 ENCOUNTER — Encounter: Payer: Self-pay | Admitting: Hematology

## 2016-04-27 ENCOUNTER — Other Ambulatory Visit (HOSPITAL_BASED_OUTPATIENT_CLINIC_OR_DEPARTMENT_OTHER): Payer: Medicare Other

## 2016-04-27 ENCOUNTER — Ambulatory Visit (HOSPITAL_BASED_OUTPATIENT_CLINIC_OR_DEPARTMENT_OTHER): Payer: Medicare Other

## 2016-04-27 ENCOUNTER — Telehealth: Payer: Self-pay | Admitting: Hematology

## 2016-04-27 VITALS — BP 150/82 | HR 72 | Temp 98.6°F | Resp 18 | Ht 66.0 in | Wt 128.0 lb

## 2016-04-27 DIAGNOSIS — D638 Anemia in other chronic diseases classified elsewhere: Secondary | ICD-10-CM | POA: Diagnosis not present

## 2016-04-27 DIAGNOSIS — C180 Malignant neoplasm of cecum: Secondary | ICD-10-CM

## 2016-04-27 DIAGNOSIS — N183 Chronic kidney disease, stage 3 (moderate): Secondary | ICD-10-CM | POA: Diagnosis not present

## 2016-04-27 DIAGNOSIS — Z5112 Encounter for antineoplastic immunotherapy: Secondary | ICD-10-CM | POA: Diagnosis not present

## 2016-04-27 DIAGNOSIS — C182 Malignant neoplasm of ascending colon: Secondary | ICD-10-CM

## 2016-04-27 DIAGNOSIS — Z79899 Other long term (current) drug therapy: Secondary | ICD-10-CM | POA: Diagnosis not present

## 2016-04-27 DIAGNOSIS — F0390 Unspecified dementia without behavioral disturbance: Secondary | ICD-10-CM | POA: Diagnosis not present

## 2016-04-27 DIAGNOSIS — I10 Essential (primary) hypertension: Secondary | ICD-10-CM | POA: Diagnosis not present

## 2016-04-27 LAB — COMPREHENSIVE METABOLIC PANEL
ALBUMIN: 3.8 g/dL (ref 3.5–5.0)
ALK PHOS: 75 U/L (ref 40–150)
ALT: 9 U/L (ref 0–55)
AST: 18 U/L (ref 5–34)
Anion Gap: 9 mEq/L (ref 3–11)
BILIRUBIN TOTAL: 0.71 mg/dL (ref 0.20–1.20)
BUN: 13.9 mg/dL (ref 7.0–26.0)
CO2: 24 meq/L (ref 22–29)
Calcium: 9.7 mg/dL (ref 8.4–10.4)
Chloride: 108 mEq/L (ref 98–109)
Creatinine: 1.4 mg/dL — ABNORMAL HIGH (ref 0.6–1.1)
EGFR: 43 mL/min/{1.73_m2} — ABNORMAL LOW (ref >90)
GLUCOSE: 89 mg/dL (ref 70–140)
Potassium: 4.4 mEq/L (ref 3.5–5.1)
SODIUM: 141 meq/L (ref 136–145)
TOTAL PROTEIN: 7.5 g/dL (ref 6.4–8.3)

## 2016-04-27 LAB — CBC WITH DIFFERENTIAL/PLATELET
BASO%: 0.6 % (ref 0.0–2.0)
Basophils Absolute: 0 10*3/uL (ref 0.0–0.1)
EOS ABS: 0.4 10*3/uL (ref 0.0–0.5)
EOS%: 5.6 % (ref 0.0–7.0)
HCT: 37.7 % (ref 34.8–46.6)
HEMOGLOBIN: 12.6 g/dL (ref 11.6–15.9)
LYMPH%: 26 % (ref 14.0–49.7)
MCH: 32.7 pg (ref 25.1–34.0)
MCHC: 33.4 g/dL (ref 31.5–36.0)
MCV: 97.9 fL (ref 79.5–101.0)
MONO#: 0.4 10*3/uL (ref 0.1–0.9)
MONO%: 6.1 % (ref 0.0–14.0)
NEUT%: 61.7 % (ref 38.4–76.8)
NEUTROS ABS: 3.9 10*3/uL (ref 1.5–6.5)
Platelets: 197 10*3/uL (ref 145–400)
RBC: 3.85 10*6/uL (ref 3.70–5.45)
RDW: 16.7 % — AB (ref 11.2–14.5)
WBC: 6.4 10*3/uL (ref 3.9–10.3)
lymph#: 1.7 10*3/uL (ref 0.9–3.3)

## 2016-04-27 LAB — UA PROTEIN, DIPSTICK - CHCC: PROTEIN: 30 mg/dL

## 2016-04-27 LAB — CEA (IN HOUSE-CHCC): CEA (CHCC-In House): 9.22 ng/mL — ABNORMAL HIGH (ref 0.00–5.00)

## 2016-04-27 MED ORDER — CAPECITABINE 500 MG PO TABS: 1000.0000 mg/m2 | ORAL_TABLET | Freq: Two times a day (BID) | ORAL | 2 refills | Status: DC

## 2016-04-27 MED ORDER — SODIUM CHLORIDE 0.9 % IV SOLN
7.5000 mg/kg | Freq: Once | INTRAVENOUS | Status: AC
  Administered 2016-04-27: 425 mg via INTRAVENOUS
  Filled 2016-04-27: qty 16

## 2016-04-27 MED ORDER — SODIUM CHLORIDE 0.9 % IV SOLN
Freq: Once | INTRAVENOUS | Status: AC
  Administered 2016-04-27: 14:00:00 via INTRAVENOUS

## 2016-04-27 MED FILL — CAPECITABINE 500 MG TABLET: 500 | 21 days supply | Qty: 84 | Fill #0

## 2016-04-27 NOTE — Patient Instructions (Signed)
[facility] Discharge Instructions for Patients Receiving Chemotherapy  Today you received the following chemotherapy agents Avastin To help prevent nausea and vomiting after your treatment, we encourage you to take your nausea medication as prescribed.   If you develop nausea and vomiting that is not controlled by your nausea medication, call the clinic.   BELOW ARE SYMPTOMS THAT SHOULD BE REPORTED IMMEDIATELY:  *FEVER GREATER THAN 100.5 F  *CHILLS WITH OR WITHOUT FEVER  NAUSEA AND VOMITING THAT IS NOT CONTROLLED WITH YOUR NAUSEA MEDICATION  *UNUSUAL SHORTNESS OF BREATH  *UNUSUAL BRUISING OR BLEEDING  TENDERNESS IN MOUTH AND THROAT WITH OR WITHOUT PRESENCE OF ULCERS  *URINARY PROBLEMS  *BOWEL PROBLEMS  UNUSUAL RASH Items with * indicate a potential emergency and should be followed up as soon as possible.  Feel free to call the clinic you have any questions or concerns. The clinic phone number is [phone].  Please show the CHEMO ALERT CARD at check-in to the Emergency Department and triage nurse.   

## 2016-04-27 NOTE — Telephone Encounter (Signed)
Gave patient dtr avs report and appointments for March. Central radiology will call re scan.

## 2016-05-16 ENCOUNTER — Ambulatory Visit (HOSPITAL_COMMUNITY): Payer: Medicare Other

## 2016-05-17 ENCOUNTER — Telehealth: Payer: Self-pay | Admitting: *Deleted

## 2016-05-17 NOTE — Telephone Encounter (Signed)
Spoke with daughter Lorriane Shire and informed her of change in appts for pt.  Pt to have CT scan on 3/15.  Gave her appts for lab at 0930 am, office visit with Dr. Burr Medico at 10 am on Friday 05/20/16.  Pt to have chemo after office visit on Friday.  Lorriane Shire understood that a scheduler will contact her with pt's appt for chemo on 05/20/16. Schedule message sent.

## 2016-05-18 ENCOUNTER — Ambulatory Visit: Payer: Medicare Other

## 2016-05-18 ENCOUNTER — Ambulatory Visit: Payer: Medicare Other | Admitting: Hematology

## 2016-05-18 ENCOUNTER — Other Ambulatory Visit: Payer: Medicare Other

## 2016-05-18 NOTE — Progress Notes (Signed)
[place]  [phone] Fax:(336) [phone]  Clinic Follow Up Note   Patient Care Team: [name], MD as PCP - General (Family Medicine) [name], MD as Consulting Physician (Medical Oncology) [name], MD as Consulting Physician (General Surgery) [name], MD as Consulting Physician (Orthopedic Surgery) Cleon Gustin, MD as Consulting Physician (Urology) 05/20/2016     CHIEF COMPLAINTS:  Follow up metastatic colon cancer  Oncology History   Cancer of right colon [facility])   Staging form: Colon and Rectum, AJCC 7th Edition     Pathologic stage from 01/23/2015: Stage IIC (T4b, N0, cM0) - Signed by [name], MD on 02/05/2015       Cancer of right colon ([place])   01/21/2015 Imaging     CT abdomen and pelvis showed high-grade small bowel obstruction,  right I [place] ureteral nephrosis,  multiple liver cysts.  CT chest was negative for metastatic lesion.      01/23/2015 Pathology Results     invasive adenocarcinoma extending into the pericecal connective tissue,  grade 2, LVI (-),  no perineural invasion, 3 lymph nodes were negative.      01/23/2015 Surgery      terminal ileum and cecum seegmental resection with anastomosis by Dr. Hassell Done      01/23/2015 Initial Diagnosis    Cancer of right colon ([place])      03/13/2015 - 07/06/2015 Chemotherapy    Xeloda 1500 mg bid X 14 days on-7 off, s/p 5 cycles, stopped early due to wrosening renal function      10/28/2015 Progression    PET scan showed multiple hypermetabolic peritoneal nodules, right ovarian cystic mass, and hypermetabolic liver lesion, highly suspicious for metastatic colon cancer. Small pulmonary nodules are indeterminate.       11/11/2015 -  Chemotherapy    Xeloda 1539m bid, 2 weeks on and one week off, Avastin was added on from cycle 3       01/08/2016 Imaging    Ct chest, Abdomen Pelvis Wo Contrast 01/08/2016  IMPRESSION: 1. Essentially stable appearance of the peritoneal  carcinomatosis and right hepatic lobe metastatic lesion. 2. Hepatic and renal cysts. 3.  Aortoiliac atherosclerotic vascular disease. 4. Lumbar spondylosis and degenerative disc disease with multilevel Impingement.      05/19/2016 Imaging    CT CAP w Contrast IMPRESSION: 1. Numerous small solid pulmonary nodules, one of which is new, and several of which have mildly increased in size since 10/28/2015, worrisome for mild progression of pulmonary metastases. 2. Solitary right liver lobe metastasis is mildly increased in size since 01/08/2016. 3. Peritoneal metastases have mildly increased in size since 01/08/2016, largest in the right pelvic sidewall. 4. Well-positioned right nephroureteral stent without overt right hydronephrosis. 5. Additional findings include aortic atherosclerosis, 1 vessel coronary atherosclerosis and mild to moderate emphysema.       HISTORY OF PRESENTING ILLNESS (02/05/2015):  Beth BURY76y.o. female  with past medical history of moderate dementia, hypertension, is here because of recently diagnosed stage II colon cancer. She is accompanied to the clinic by her daughter. History of manic is from the chart and her daughter.  She has had abdominal discomfort, nausea and vomiting, and norexia for  the past to 3 months along with 15 pounds weight loss, . Her nausea was mild and intermittent, she does not seek medical attention initially. She presented with  worsening symptoms for 2-3 weeks, and episodes of projectile vomiting and dehydration to emergency room, [facility]. CT scan reviewed small bowel obstruction, and possible appendiceal rupture. She was brought to OR on 01/23/2015, underwent right hemicolectomy with anastomosis by Dr. Hassell Done. Surgical path reviewed adenocarcinoma at the cecum. She was discharged home on 01/26/2015.  She has recovered well, eating much better than prior to surgery, move around without restriction.  No pain, she has loose BM 3 times a day, and she has backed off her mirealax.  she lives with her daughter. She states her appetite is moderate, sometime low, and she refuses to eat if she doesn't have appetite. No nausea, vomiting, abdominal bloating since her surgery.  Her last colonoscopy was 6-8 years ago, and her stool OB was negative this year.   CURRENT THERAPY: Xeloda 1569m bid, 2 weeks on and one week off, and Avastin every 3 weeks, started on 11/11/2015  INTERIM HISTORY: DMadysenreturns for follow-up. She has been doing well. When she had the contrast, she had some diarrhea and abdominal pain. Her daughter knows to give her an Imodium in the future. She has not started her Xeloda yet. Her daughter says she forgot to take her BP mediation this morning. Denies cough, chest pain, fatigue, headaches, dark urine, or any other concerns.   MEDICAL HISTORY:  Past Medical History:  Diagnosis Date  . Anxiety   . Arthritis   . Bilateral renal cysts   . Centrilobular emphysema ([place]   . Chronic low back pain   . CKD (chronic kidney disease), stage III   . Colon cancer ([facility] oncologist-  dr yTruitt Merle  dx 01-23-2015  right colon Invasive adenocarcinoma into pericecal connective tissue and involving small intestine , Stage IIc, Grade 2, LVI(-),  (pT4b N0 M0) /  s/p resection termial ileum and cecum and chemotherapy 01-06-217 to 07-06-2015/   08/ 2017  per PET  recurrent w/ liver mets and peritoneal carcinomatosis  . Complication of anesthesia    can be combative due to dementia  . Full dentures   . History of gastroesophageal reflux (GERD)    changes diet  . History of pelvic fracture   . Hyperlipidemia   . Hypertension   . Liver metastasis ([place]   . Moderate dementia without behavioral disturbance    moderate to severe dementia per neurologist note (dr yKrista Blue in epic  . Pulmonary nodules   . Right ovarian cyst   . Ureteral obstruction, right urologist-  dr mAlyson Ingles  malignant obstruction  right ureter w/ colon cancer -- treated with ureteral stent placement    SURGICAL HISTORY: Past Surgical History:  Procedure Laterality Date  . CATARACT EXTRACTION W/ INTRAOCULAR LENS  IMPLANT, BILATERAL  2005 approx  . CYSTOSCOPY W/ URETERAL STENT PLACEMENT Right 12/03/2015   Procedure: CYSTOSCOPY WITH RETROGRADE PYELOGRAM/URETERAL STENT PLACEMENT;  Surgeon: PCleon Gustin MD;  [facility]  Service: Urology;  Laterality: Right;  . CYSTOSCOPY W/ URETERAL STENT PLACEMENT Right 02/15/2016   Procedure: CYSTOSCOPY WITH RETROGRADE PYELOGRAM/URETERAL STENT REPLACEMENT;  Surgeon: PCleon Gustin MD;  [facility]  Service: Urology;  Laterality: Right;  . CYSTOSCOPY W/ URETERAL STENT PLACEMENT Right 03/28/2016   Procedure: CYSTOSCOPY WITH RETROGRADE PYELOGRAM/URETERAL STENT EXCHANGE;  Surgeon: PCleon Gustin MD;  [facility]  Service: Urology;  Laterality: Right;  . CYSTOSCOPY WITH RETROGRADE PYELOGRAM, URETEROSCOPY AND STENT PLACEMENT Right 09/21/2015   Procedure: CYSTOSCOPY WITH BIL RETROGRADE PYELOGRAM, URETEROSCOPY, RIGHT STENT PLACEMENT ;  Surgeon: PCleon Gustin MD;  Location:   SURGERY CENTER;  Service: Urology;  Laterality: Right;  . LAPAROSCOPY N/A 01/23/2015   Procedure: DIAGNOSTIC LAPAROSCOPY, EXPLORATORY LAPAROTOMY  RIGHT HEMI-COLECTOMY FOR OBSTRUCTION OF RIGHT TERMINAL ILEUM;  Surgeon: [name], MD;  Location: [facility];  Service: General;  Laterality: N/A;    SOCIAL HISTORY: Social History   Social History  . Marital status: Single    Spouse name: N/A  . Number of children: N/A  . Years of education: N/A   Occupational History  . Not on file.   Social History Main Topics  . Smoking status: Former Smoker    Packs/day: 0.25    Years: 50.00    Types: Cigarettes    Quit date: 01/25/2015  . Smokeless tobacco: Never Used  . Alcohol use 1.8 oz/week    2 Glasses of wine, 1  Cans of beer per week     Comment: occasional   . Drug use: No  . Sexual activity: Not on file   Other Topics Concern  . Not on file   Social History Narrative   Legally separated   Lives w/daughter, Nalani Andreen   Has total of #2 daughters and #1 son   Dementia   Fall Risk-ambulates w/cane    FAMILY HISTORY: Family History  Problem Relation Age of Onset  . Diabetes Mellitus II Father   . Cancer Mother 36    ovarian cancer   . Cervical cancer Other     ALLERGIES:  is allergic to namenda [memantine hcl] and oxycodone.  MEDICATIONS:  Current Outpatient Prescriptions  Medication Sig Dispense Refill  . amLODipine (NORVASC) 5 MG tablet Take 5 mg by mouth every morning.     Marland Kitchen antiseptic oral rinse (BIOTENE) LIQD 15 mLs by Mouth Rinse route daily. BIOTENE CLINICAL    . Cholecalciferol (VITAMIN D3) 2000 UNITS TABS Take by mouth daily.    . Coenzyme Q10 (CO Q 10) 100 MG CAPS Take by mouth daily.    Marland Kitchen donepezil (ARICEPT) 10 MG tablet TAKE 1 TABLET BY MOUTH EVERY DAY 30 tablet 11  . Multiple Vitamins-Minerals (WOMENS MULTIVITAMIN PO) Take 1 tablet by mouth daily.    . polyethylene glycol (MIRALAX / GLYCOLAX) packet Take 17 g by mouth daily. (Patient taking differently: Take 17 g by mouth daily as needed. ) 14 each 0  . simvastatin (ZOCOR) 20 MG tablet Take 20 mg by mouth daily at 6 PM.    . traMADol (ULTRAM) 50 MG tablet Take 1 tablet (50 mg total) by mouth every 6 (six) hours as needed. 30 tablet 0  . capecitabine (XELODA) 500 MG tablet Take 3 tablets (1,500 mg total) by mouth 2 (two) times daily after a meal. Take 1500 mg by mouth 2 times daily for  14 days on ;  7 days off. (Patient not taking: Reported on 05/20/2016) 84 tablet 2   No current facility-administered medications for this visit.     REVIEW OF SYSTEMS:  Constitutional: Denies fevers, chills or abnormal night sweats Eyes: Denies blurriness of vision, double vision or watery eyes Ears, nose, mouth, throat, and  face: Denies mucositis or sore throat Respiratory: Denies cough, dyspnea or wheezes Cardiovascular: Denies palpitation, chest discomfort or lower extremity swelling Gastrointestinal:  Denies nausea, heartburn or change in bowel habits (+) diarrhea, abdominal pain from contrast  Skin: Denies abnormal skin rashes Lymphatics: Denies new lymphadenopathy or easy bruising Neurological:Denies numbness, tingling or new weaknesses Behavioral/Psych: Mood is stable, no new changes  All other systems were reviewed with the patient and  are negative.  PHYSICAL EXAMINATION: ECOG PERFORMANCE STATUS: 1 - Symptomatic but completely ambulatory  Vitals:   05/20/16 1022  BP: (!) 150/90  Pulse: 90  Resp: 18  Temp: 98.2 F (36.8 C)   Filed Weights   05/20/16 1022  Weight: 128 lb 8 oz (58.3 kg)   GENERAL:alert, no distress and comfortable SKIN: skin color, texture, turgor are normal, no rashes or significant lesions EYES: normal, conjunctiva are pink and non-injected, sclera clear OROPHARYNX:no exudate, no erythema and lips, buccal mucosa, and tongue normal  NECK: supple, thyroid normal size, non-tender, without nodularity LYMPH:  no palpable lymphadenopathy in the cervical, axillary or inguinal LUNGS: clear to auscultation and percussion with normal breathing effort HEART: regular rate & rhythm and no murmurs and no lower extremity edema ABDOMEN:abdomen soft, non-tender and normal bowel sounds Musculoskeletal:no cyanosis of digits and no clubbing  PSYCH: alert & oriented x 3 with fluent speech NEURO: no focal motor/sensory deficits  LABORATORY DATA:  I have reviewed the data as listed CBC Latest Ref Rng & Units 05/20/2016 04/27/2016 04/06/2016  WBC 3.9 - 10.3 10e3/uL 9.1 6.4 7.1  Hemoglobin 11.6 - 15.9 g/dL 13.1 12.6 13.1  Hematocrit 34.8 - 46.6 % 38.7 37.7 39.5  Platelets 145 - 400 10e3/uL 187 197 271    CMP Latest Ref Rng & Units 05/20/2016 04/27/2016 04/06/2016  Glucose 70 - 140 mg/dl 93 89  67(L)  BUN 7.0 - 26.0 mg/dL 15.0 13.9 15.5  Creatinine 0.6 - 1.1 mg/dL 1.5(H) 1.4(H) 1.4(H)  Sodium 136 - 145 mEq/L 141 141 140  Potassium 3.5 - 5.1 mEq/L 3.2(L) 4.4 3.8  Chloride 101 - 111 mmol/L - - -  CO2 22 - 29 mEq/L _0 Calcium 8.4 - 10.4 mg/dL 9.4 9.7 9.7  Total Protein 6.4 - 8.3 g/dL 7.5 7.5 8.5(H)  Total Bilirubin 0.20 - 1.20 mg/dL 2.61(H) 0.71 1.31(H)  Alkaline Phos 40 - 150 U/L 72 75 80  AST 5 - 34 U/L _1 ALT 0 - 55 U/L _2 CEA  01/23/2015: 5.7 06/05/2015: 6.1 08/04/2015: 10.3 10/12/2015: 13.29 (in-house)  11/04/2015: 12.83 01/13/2016: 8.57 02/24/2016: 8.41 04/27/2016: 9.22 05/20/2016: PENDING  PATHOLOGY REPORT: Diagnosis 01/23/2015 Colon, segmental resection, with terminal ileum and cecum - INVASIVE ADENOCARCINOMA EXTENDING INTO PERICECAL CONNECTIVE TISSUE AND INVOLVING SMALL INTESTINE. - THREE BENIGN LYMPH NODES (0/3). - SEE ONCOLOGY TABLE BELOW.  Microscopic Comment COLON AND RECTUM (INCLUDING TRANS-ANAL RESECTION): Specimen: Terminal ileum and cecum. Procedure: Resection. Tumor site: Cecum adjacent to appendix. Specimen integrity: Intact. Macroscopic intactness of mesorectum: Not applicable. Macroscopic tumor perforation: No. Invasive tumor: Maximum size: 4.0 cm. Histologic type(s): Colorectal adenocarcinoma. Histologic grade and differentiation: G2: moderately differentiated/low grade. Type of polyp in which invasive carcinoma arose: No residual polyp. Microscopic extension of invasive tumor: Into pericecal connective tissue and terminal ileum. Lymph-Vascular invasion: Not identified. Peri-neural invasion: Not identified. Tumor deposit(s) (discontinuous extramural extension): No. Resection margins: Proximal margin: Free of tumor. Distal margin: Free of tumor. Circumferential (radial) (posterior ascending, posterior descending; lateral and posterior mid-rectum; and entire lower 1/3 rectum): Free of tumor. Mesenteric margin (sigmoid and  transverse): N/A. Distance closest margin (if all above margins negative): N/A. Treatment effect (neo-adjuvant therapy): No. Additional polyp(s): No. Non-neoplastic findings: N/A. Lymph nodes: number examined 3; number positive: 0. Pathologic Staging: pT4b, pN0, pMX. Ancillary studies: Mismatch repair protein by immunohistochemistry pending. Comments: There is a colorectal-type adenocarcinoma arising in the cecum in the area of the appendiceal orifice. The tumor extends  into the pericecal connective tissue, and involves the attached segment of terminal ileum. Immunohistochemistry shows the tumor is positive with CDX2 and cytokeratin 20, and negative with cytokeratin 7, estrogen receptor and WT-1 supporting a diagnosis of primary colorectal adenocarcinoma. ADDITIONAL INFORMATION: Mismatch Repair (MMR) Protein Immunohistochemistry (IHC) IHC Expression Result: MLH1: Preserved nuclear expression (greater 50% tumor expression) MSH2: Preserved nuclear expression (greater 50% tumor expression) MSH6: Preserved nuclear expression (greater 50% tumor expression) PMS2: Preserved nuclear expression (greater 50% tumor expression) * Internal control demonstrates intact nuclear expression Interpretation: NORMAL There is preserved expression of the major and minor MMR proteins. There is a very low probability that microsatellite instability (MSI) is present. However, certain clinically significant MMR protein mutations may result in preservation of nuclear expression. It is recommended that the preservation of protein expression be correlated with molecular based MSI testing.    RADIOGRAPHIC STUDIES: I have personally reviewed the radiological images as listed and agreed with the findings in the report.  CT CAP w Contrast 05/19/2016 IMPRESSION: 1. Numerous small solid pulmonary nodules, one of which is new, and several of which have mildly increased in size since 10/28/2015, worrisome for mild  progression of pulmonary metastases. 2. Solitary right liver lobe metastasis is mildly increased in size since 01/08/2016. 3. Peritoneal metastases have mildly increased in size since 01/08/2016, largest in the right pelvic sidewall. 4. Well-positioned right nephroureteral stent without overt right hydronephrosis. 5. Additional findings include aortic atherosclerosis, 1 vessel coronary atherosclerosis and mild to moderate emphysema.  Ct chest, Abdomen Pelvis Wo Contrast 01/08/2016  IMPRESSION: 1. 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 2. Hepatic and renal cysts. 3.  Aortoiliac atherosclerotic vascular disease. 4. Lumbar spondylosis and degenerative disc disease with multilevel impingement.  ASSESSMENT & PLAN: 76 y.o. African-American female, with past medical history of moderate dementia, independent with ADLs, hypertension, presents with small bowel obstruction and weight loss.  1. 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 -I personally reviewed her recent PET scan images, unfortunately the scan showed hypermetabolic peritoneal and liver metastases. 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 -She has started Xeloda 1517m bid, 2 weeks on and one week off. 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 The tumor has MSI stable, she is not a  candidate for immunotherapy, although she may be a [facility] -she is tolerating Xeloda and Avastin well -I previously reviewed her restaging CT scan from 01/08/2016, which showed overall stable disease, no new lesions. We'll continue her chemotherapy. -I reviewed the CT from 05/19/2016 with the patient and her daughter in detail. She has had a moderate disease progression in lungs, liver and peritoneum. I recommend her to change treatment. -We discussed net line treatment options, including FOLFOX, FOLFIRI and single agent irinotecan with Avastin. Due to her advanced age and dementia, I do not think that she can handle 5-FU pump well,  I recommend single agent irinotecan and Avastin. -I think a palliative care alone would be also reasonable alternative management options. After discussion, patient and her daughter wants to try chemotherapy for now. --Chemotherapy consent: Side effects including but does not not limited to, fatigue, nausea, vomiting, diarrhea, hair loss, neuropathy, fluid retention, renal and kidney dysfunction, neutropenic fever, needed for blood transfusion, bleeding, were discussed with patient in great detail. She agrees to proceed. -I'll start irinotecan at a reduced dose for first cycle, to see how she responds. If she has poor tolerance to chemotherapy, I will likely stop chemotherapy and change to palliative care alone. -Lab reviewed, her total bilirubin increased to 2.6 today, possible related to Xeloda, her liver metastasis tumor burden is not very high. I recommended her to hold Xeloda, we'll proceed Avastin today. -Start irinotecan in 2 weeks, and continue Avastin every 2 weeks  2. CKD stage III, right hydroureter -Her Cr was around 1 in 08/2013, Cr increased to 2.4 on 01/20/2015 when she was diagnosed with colon cancer due to dehydration, improved some and has been around 1.3-1.5 lately.  -Her previous CT scan  showed right hydroureter, which probably contributes to her renal insufficiency. -she had ureter stent placement again in 12/2015, 03/2016 and 04/2016  3. HTN, dementia -She will continue her medication and follow-up with her primary care physician. -Her BP has been better controlled lately, we discussed that Avastin can cause hypertension, we'll continue monitoring closely.  4. Hyperbilirubinemia -probably related to liver metastasis, vs Xeloda  -Her total bilirubin has increased to 2.6 today, we will hold her Xeloda. -We discussed the signs of jaundice, her daughter will watch her her at home and call us if she noticed jaundice.  5. Goal of care discussion  -We again discussed the incurable nature of her cancer, and the overall poor prognosis, especially if she has progress on chemo -The patient understands the goal of care is palliative. -Due to her advanced age and comorbidities, especially dementia, I have low threshold to switch her to palliative care alone if she does not tolerate chemo well  -I recommend DNR/DNI, she agrees.   Plan -Avastin today, hold Xeloda. Change treatment plan for next cycle.  -I will see her back in 2 weeks, before she starts Irinotecan    All questions were answered. The patient knows to call the clinic with any problems, questions or concerns.  I spent 25 minutes counseling the patient face to face. The total time spent in the appointment was 30 minutes and more than 50% was on counseling.  This document serves as a record of services personally performed by [name], MD. It was created on her behalf by Martinique Casey, a trained medical scribe. The creation of this record is based on the scribe's personal observations and the provider's statements to them. This document has been checked and approved by the attending provider.  I have reviewed the above documentation for accuracy and completeness and I agree with the above.   [name], MD 05/20/2016

## 2016-05-19 ENCOUNTER — Ambulatory Visit (HOSPITAL_COMMUNITY)
Admission: RE | Admit: 2016-05-19 | Discharge: 2016-05-19 | Disposition: A | Discharge status: Home / Self Care | Payer: Medicare Other | Source: Ambulatory Visit | Attending: Hematology | Admitting: Hematology

## 2016-05-19 DIAGNOSIS — R918 Other nonspecific abnormal finding of lung field: Secondary | ICD-10-CM | POA: Diagnosis not present

## 2016-05-19 DIAGNOSIS — C182 Malignant neoplasm of ascending colon: Secondary | ICD-10-CM | POA: Insufficient documentation

## 2016-05-19 DIAGNOSIS — C787 Secondary malignant neoplasm of liver and intrahepatic bile duct: Secondary | ICD-10-CM | POA: Diagnosis not present

## 2016-05-19 DIAGNOSIS — I700 Atherosclerosis of aorta: Secondary | ICD-10-CM | POA: Diagnosis not present

## 2016-05-19 DIAGNOSIS — J439 Emphysema, unspecified: Secondary | ICD-10-CM | POA: Diagnosis not present

## 2016-05-19 DIAGNOSIS — I2510 Atherosclerotic heart disease of native coronary artery without angina pectoris: Secondary | ICD-10-CM | POA: Diagnosis not present

## 2016-05-19 DIAGNOSIS — C786 Secondary malignant neoplasm of retroperitoneum and peritoneum: Secondary | ICD-10-CM | POA: Diagnosis not present

## 2016-05-20 ENCOUNTER — Other Ambulatory Visit (HOSPITAL_BASED_OUTPATIENT_CLINIC_OR_DEPARTMENT_OTHER): Payer: Medicare Other

## 2016-05-20 ENCOUNTER — Encounter: Payer: Self-pay | Admitting: Hematology

## 2016-05-20 ENCOUNTER — Telehealth: Payer: Self-pay | Admitting: Hematology

## 2016-05-20 ENCOUNTER — Ambulatory Visit (HOSPITAL_BASED_OUTPATIENT_CLINIC_OR_DEPARTMENT_OTHER): Payer: Medicare Other | Admitting: Hematology

## 2016-05-20 ENCOUNTER — Ambulatory Visit (HOSPITAL_BASED_OUTPATIENT_CLINIC_OR_DEPARTMENT_OTHER): Payer: Medicare Other

## 2016-05-20 VITALS — BP 150/90 | HR 90 | Temp 98.2°F | Resp 18 | Ht 66.0 in | Wt 128.5 lb

## 2016-05-20 DIAGNOSIS — Z79899 Other long term (current) drug therapy: Secondary | ICD-10-CM | POA: Diagnosis not present

## 2016-05-20 DIAGNOSIS — F03B0 Unspecified dementia, moderate, without behavioral disturbance, psychotic disturbance, mood disturbance, and anxiety: Secondary | ICD-10-CM

## 2016-05-20 DIAGNOSIS — C182 Malignant neoplasm of ascending colon: Secondary | ICD-10-CM

## 2016-05-20 DIAGNOSIS — R634 Abnormal weight loss: Secondary | ICD-10-CM | POA: Diagnosis not present

## 2016-05-20 DIAGNOSIS — R110 Nausea: Secondary | ICD-10-CM

## 2016-05-20 DIAGNOSIS — I10 Essential (primary) hypertension: Secondary | ICD-10-CM

## 2016-05-20 DIAGNOSIS — F0390 Unspecified dementia without behavioral disturbance: Secondary | ICD-10-CM

## 2016-05-20 DIAGNOSIS — C180 Malignant neoplasm of cecum: Secondary | ICD-10-CM | POA: Diagnosis not present

## 2016-05-20 DIAGNOSIS — Z5112 Encounter for antineoplastic immunotherapy: Secondary | ICD-10-CM | POA: Diagnosis not present

## 2016-05-20 DIAGNOSIS — N183 Chronic kidney disease, stage 3 (moderate): Secondary | ICD-10-CM | POA: Diagnosis not present

## 2016-05-20 LAB — COMPREHENSIVE METABOLIC PANEL
ALBUMIN: 3.7 g/dL (ref 3.5–5.0)
ALK PHOS: 72 U/L (ref 40–150)
ALT: 11 U/L (ref 0–55)
AST: 18 U/L (ref 5–34)
Anion Gap: 11 mEq/L (ref 3–11)
BUN: 15 mg/dL (ref 7.0–26.0)
CALCIUM: 9.4 mg/dL (ref 8.4–10.4)
CHLORIDE: 107 meq/L (ref 98–109)
CO2: 23 mEq/L (ref 22–29)
CREATININE: 1.5 mg/dL — AB (ref 0.6–1.1)
EGFR: 41 mL/min/{1.73_m2} — ABNORMAL LOW (ref >90)
GLUCOSE: 93 mg/dL (ref 70–140)
POTASSIUM: 3.2 meq/L — AB (ref 3.5–5.1)
SODIUM: 141 meq/L (ref 136–145)
Total Bilirubin: 2.61 mg/dL — ABNORMAL HIGH (ref 0.20–1.20)
Total Protein: 7.5 g/dL (ref 6.4–8.3)

## 2016-05-20 LAB — CBC WITH DIFFERENTIAL/PLATELET
BASO%: 0.4 % (ref 0.0–2.0)
Basophils Absolute: 0 10*3/uL (ref 0.0–0.1)
EOS%: 1.9 % (ref 0.0–7.0)
Eosinophils Absolute: 0.2 10*3/uL (ref 0.0–0.5)
HEMATOCRIT: 38.7 % (ref 34.8–46.6)
HEMOGLOBIN: 13.1 g/dL (ref 11.6–15.9)
LYMPH#: 0.9 10*3/uL (ref 0.9–3.3)
LYMPH%: 10.2 % — ABNORMAL LOW (ref 14.0–49.7)
MCH: 32.9 pg (ref 25.1–34.0)
MCHC: 33.9 g/dL (ref 31.5–36.0)
MCV: 97.2 fL (ref 79.5–101.0)
MONO#: 0.6 10*3/uL (ref 0.1–0.9)
MONO%: 6.4 % (ref 0.0–14.0)
NEUT%: 81.1 % — AB (ref 38.4–76.8)
NEUTROS ABS: 7.3 10*3/uL — AB (ref 1.5–6.5)
Platelets: 187 10*3/uL (ref 145–400)
RBC: 3.98 10*6/uL (ref 3.70–5.45)
RDW: 16.8 % — AB (ref 11.2–14.5)
WBC: 9.1 10*3/uL (ref 3.9–10.3)
nRBC: 0 % (ref 0–0)

## 2016-05-20 LAB — UA PROTEIN, DIPSTICK - CHCC: PROTEIN: 100 mg/dL

## 2016-05-20 LAB — CEA (IN HOUSE-CHCC): CEA (CHCC-In House): 8.32 ng/mL — ABNORMAL HIGH (ref 0.00–5.00)

## 2016-05-20 MED ORDER — SODIUM CHLORIDE 0.9 % IV SOLN
Freq: Once | INTRAVENOUS | Status: AC
  Administered 2016-05-20: 12:00:00 via INTRAVENOUS

## 2016-05-20 MED ORDER — SODIUM CHLORIDE 0.9 % IV SOLN
5.0000 mg/kg | Freq: Once | INTRAVENOUS | Status: AC
  Administered 2016-05-20: 300 mg via INTRAVENOUS
  Filled 2016-05-20: qty 12

## 2016-05-20 NOTE — Progress Notes (Signed)
START OFF PATHWAY REGIMEN - Colorectal   [id number]:Irinotecan 180 mg/m2 q14 Days:   A cycle is every 14 days:     Irinotecan   **Always confirm dose/schedule in your pharmacy ordering system**    Patient Characteristics: Metastatic Colorectal, Second Line, KRAS Mutation Positive/Unknown, BRAF Wild-Type/Unknown, Bevacizumab Eligible Current evidence of distant metastases? Yes AJCC T Category: [place] AJCC N Category: NX AJCC M Category: Staged < 8th Ed. AJCC 8 Stage Grouping: IVC BRAF Mutation Status: Wild Type (no mutation) KRAS/NRAS Mutation Status: Mutation Positive Line of therapy: Second Line Would you be surprised if this patient died  in the next year? I would NOT be surprised if this patient died in the next year  Intent of Therapy: Non-Curative / Palliative Intent, Discussed with Patient

## 2016-05-20 NOTE — Progress Notes (Signed)
OK to treat with Avastin with Bilirubin 2.61 , and  Crea 1.5 as per Dr. Burr Medico.

## 2016-05-20 NOTE — Patient Instructions (Signed)
[facility] Discharge Instructions for Patients Receiving Chemotherapy  Today you received the following chemotherapy agents: avastin   To help prevent nausea and vomiting after your treatment, we encourage you to take your nausea medication.  Take it as often as prescribed.     If you develop nausea and vomiting that is not controlled by your nausea medication, call the clinic. If it is after clinic hours your family physician or the after hours number for the clinic or go to the Emergency Department.   BELOW ARE SYMPTOMS THAT SHOULD BE REPORTED IMMEDIATELY:  *FEVER GREATER THAN 100.5 F  *CHILLS WITH OR WITHOUT FEVER  NAUSEA AND VOMITING THAT IS NOT CONTROLLED WITH YOUR NAUSEA MEDICATION  *UNUSUAL SHORTNESS OF BREATH  *UNUSUAL BRUISING OR BLEEDING  TENDERNESS IN MOUTH AND THROAT WITH OR WITHOUT PRESENCE OF ULCERS  *URINARY PROBLEMS  *BOWEL PROBLEMS  UNUSUAL RASH Items with * indicate a potential emergency and should be followed up as soon as possible.  Feel free to call the clinic you have any questions or concerns. The clinic phone number is [phone].   I have been informed and understand all the instructions given to me. I know to contact the clinic, my physician, or go to the Emergency Department if any problems should occur. I do not have any questions at this time, but understand that I may call the clinic during office hours   should I have any questions or need assistance in obtaining follow up care.    __________________________________________  _____________  __________ Signature of Patient or Authorized Representative            Date                   Time    __________________________________________ Nurse's Signature    

## 2016-05-20 NOTE — Progress Notes (Signed)
OK to treat with urine protein 100 per Dr Feng. 

## 2016-05-20 NOTE — Telephone Encounter (Signed)
Lab, Follow up and Chemo ( IRINOTECAN & AVASTIN  3.5 hrs ) was scheduled in 2 and 4 weeks, per 05/20/16 los. Also confirmed verbally with Dr Burr Medico. Patient was given a copy of the AVS report and appointment schedule, per 05/20/16 los.

## 2016-05-24 ENCOUNTER — Encounter: Payer: Self-pay | Admitting: Pharmacist

## 2016-05-25 ENCOUNTER — Telehealth: Payer: Self-pay | Admitting: Neurology

## 2016-05-25 NOTE — Telephone Encounter (Signed)
Patient daughter Lorriane Shire called and states that patient is talking to herself when she see herself in the mirror and is having insomnia please call [phone]

## 2016-05-26 NOTE — Telephone Encounter (Signed)
Pls have daughter stop the Lexapro and start mirtazapine 15mg  at bedtime to help with insomnia and hallucinations. Side effect of drowsiness and dizziness typically. Pls send Rx. When is her psychiatry appointment? Thanks

## 2016-05-26 NOTE — Telephone Encounter (Signed)
Returned daughter's cll -  Lorriane Shire states that she would like to ahead with having her mother seen by a psychiatrist or psychologist due to the following:  1- Since beginning Lexapro on Saturday, 05/21/16, pt has not beein sleeping well at all. Pt is staying awake most of the night.  2- Pt is talking to someone when she's looking at the bathroom mirror - becomes very hostile at times.  Advsd Lorriane Shire I would forward message to provider to advise next step.

## 2016-05-30 MED ORDER — MIRTAZAPINE 15 MG PO TABS: 15.0000 mg | ORAL_TABLET | Freq: Every day | ORAL | 3 refills | Status: DC

## 2016-05-30 MED FILL — MIRTAZAPINE 15 MG TABLET: 15 | 30 days supply | Qty: 30 | Fill #0

## 2016-05-30 NOTE — Telephone Encounter (Signed)
Clld pt's daughter, Lorriane Shire - advsd of change in medications from Lexapro to Mirtazapine 15 mg 1 tablet at bedtime.  Lorriane Shire stated her mom did well during the day on Lexapro - it was just the insomnia that was a concern.  Lorriane Shire stated she does want to go forward with psychiatry as well.   Lorriane Shire stated that pt did very well at the [place] she took her took on Friday. She stated the pt's mind was clear - she could count, responded to questions asked directly to her. Lorriane Shire stated she did find out that her mom married her father because she was pregnant with her. She feels that she was unwanted by her mom which is causing a lot of the hostility she gets from her mom. She states she is okay with that.   Advsd I would be electronically sending the Mirtazapine to the pharmacy.  Advsd I would look into the psychiatry referral.

## 2016-05-30 NOTE — Addendum Note (Signed)
Addended by: Tommas Olp B on: 05/30/2016 11:27 AM   Modules accepted: Orders

## 2016-06-02 ENCOUNTER — Other Ambulatory Visit: Payer: Self-pay | Admitting: Hematology

## 2016-06-03 ENCOUNTER — Ambulatory Visit (HOSPITAL_BASED_OUTPATIENT_CLINIC_OR_DEPARTMENT_OTHER): Payer: Medicare Other | Admitting: Nurse Practitioner

## 2016-06-03 ENCOUNTER — Telehealth: Payer: Self-pay

## 2016-06-03 ENCOUNTER — Telehealth: Payer: Self-pay | Admitting: *Deleted

## 2016-06-03 ENCOUNTER — Ambulatory Visit (HOSPITAL_BASED_OUTPATIENT_CLINIC_OR_DEPARTMENT_OTHER): Payer: Medicare Other

## 2016-06-03 ENCOUNTER — Other Ambulatory Visit (HOSPITAL_BASED_OUTPATIENT_CLINIC_OR_DEPARTMENT_OTHER): Payer: Medicare Other

## 2016-06-03 VITALS — BP 154/71 | HR 88 | Temp 98.0°F | Resp 18 | Ht 66.0 in | Wt 123.8 lb

## 2016-06-03 DIAGNOSIS — R300 Dysuria: Secondary | ICD-10-CM

## 2016-06-03 DIAGNOSIS — C182 Malignant neoplasm of ascending colon: Secondary | ICD-10-CM

## 2016-06-03 DIAGNOSIS — Z5111 Encounter for antineoplastic chemotherapy: Secondary | ICD-10-CM

## 2016-06-03 DIAGNOSIS — C180 Malignant neoplasm of cecum: Secondary | ICD-10-CM

## 2016-06-03 DIAGNOSIS — Z5112 Encounter for antineoplastic immunotherapy: Secondary | ICD-10-CM | POA: Diagnosis not present

## 2016-06-03 DIAGNOSIS — I10 Essential (primary) hypertension: Secondary | ICD-10-CM | POA: Diagnosis not present

## 2016-06-03 DIAGNOSIS — N183 Chronic kidney disease, stage 3 (moderate): Secondary | ICD-10-CM | POA: Diagnosis not present

## 2016-06-03 LAB — COMPREHENSIVE METABOLIC PANEL
ALBUMIN: 3.4 g/dL — AB (ref 3.5–5.0)
ALK PHOS: 91 U/L (ref 40–150)
ALT: 8 U/L (ref 0–55)
ANION GAP: 13 meq/L — AB (ref 3–11)
AST: 14 U/L (ref 5–34)
BILIRUBIN TOTAL: 0.7 mg/dL (ref 0.20–1.20)
BUN: 15.9 mg/dL (ref 7.0–26.0)
CO2: 24 mEq/L (ref 22–29)
CREATININE: 1.5 mg/dL — AB (ref 0.6–1.1)
Calcium: 9.8 mg/dL (ref 8.4–10.4)
Chloride: 103 mEq/L (ref 98–109)
EGFR: 40 mL/min/{1.73_m2} — AB (ref >90)
GLUCOSE: 94 mg/dL (ref 70–140)
Potassium: 3.8 mEq/L (ref 3.5–5.1)
SODIUM: 140 meq/L (ref 136–145)
TOTAL PROTEIN: 8.2 g/dL (ref 6.4–8.3)

## 2016-06-03 LAB — CBC WITH DIFFERENTIAL/PLATELET
BASO%: 0.5 % (ref 0.0–2.0)
Basophils Absolute: 0.1 10*3/uL (ref 0.0–0.1)
EOS ABS: 0.2 10*3/uL (ref 0.0–0.5)
EOS%: 1.9 % (ref 0.0–7.0)
HCT: 37.2 % (ref 34.8–46.6)
HGB: 12.5 g/dL (ref 11.6–15.9)
LYMPH%: 9.9 % — ABNORMAL LOW (ref 14.0–49.7)
MCH: 32.7 pg (ref 25.1–34.0)
MCHC: 33.5 g/dL (ref 31.5–36.0)
MCV: 97.7 fL (ref 79.5–101.0)
MONO#: 0.6 10*3/uL (ref 0.1–0.9)
MONO%: 4.2 % (ref 0.0–14.0)
NEUT%: 83.5 % — ABNORMAL HIGH (ref 38.4–76.8)
NEUTROS ABS: 11.3 10*3/uL — AB (ref 1.5–6.5)
PLATELETS: 418 10*3/uL — AB (ref 145–400)
RBC: 3.81 10*6/uL (ref 3.70–5.45)
RDW: 16.9 % — AB (ref 11.2–14.5)
WBC: 13.5 10*3/uL — ABNORMAL HIGH (ref 3.9–10.3)
lymph#: 1.3 10*3/uL (ref 0.9–3.3)

## 2016-06-03 LAB — URINALYSIS, MICROSCOPIC - CHCC
Bilirubin (Urine): NEGATIVE
Glucose: NEGATIVE mg/dL
KETONES: NEGATIVE mg/dL
Nitrite: NEGATIVE
Protein: 100 mg/dL
SPECIFIC GRAVITY, URINE: 1.01 (ref 1.003–1.035)
Urobilinogen, UR: 0.2 mg/dL (ref 0.2–1)
pH: 7 (ref 4.6–8.0)

## 2016-06-03 MED ORDER — DEXAMETHASONE SODIUM PHOSPHATE 10 MG/ML IJ SOLN
10.0000 mg | Freq: Once | INTRAMUSCULAR | Status: AC
  Administered 2016-06-03: 10 mg via INTRAVENOUS

## 2016-06-03 MED ORDER — IRINOTECAN HCL CHEMO INJECTION 100 MG/5ML
150.0000 mg/m2 | Freq: Once | INTRAVENOUS | Status: AC
  Administered 2016-06-03: 240 mg via INTRAVENOUS
  Filled 2016-06-03: qty 2

## 2016-06-03 MED ORDER — DEXAMETHASONE SODIUM PHOSPHATE 10 MG/ML IJ SOLN
INTRAMUSCULAR | Status: AC
  Filled 2016-06-03: qty 1

## 2016-06-03 MED ORDER — PROCHLORPERAZINE MALEATE 5 MG PO TABS: 5.0000 mg | ORAL_TABLET | Freq: Four times a day (QID) | ORAL | 0 refills | Status: DC | PRN

## 2016-06-03 MED ORDER — SODIUM CHLORIDE 0.9 % IV SOLN
Freq: Once | INTRAVENOUS | Status: AC
  Administered 2016-06-03: 13:00:00 via INTRAVENOUS

## 2016-06-03 MED ORDER — PALONOSETRON HCL INJECTION 0.25 MG/5ML
0.2500 mg | Freq: Once | INTRAVENOUS | Status: AC
  Administered 2016-06-03: 0.25 mg via INTRAVENOUS

## 2016-06-03 MED ORDER — SODIUM CHLORIDE 0.9 % IV SOLN
5.0000 mg/kg | Freq: Once | INTRAVENOUS | Status: AC
  Administered 2016-06-03: 300 mg via INTRAVENOUS
  Filled 2016-06-03: qty 12

## 2016-06-03 MED ORDER — ATROPINE SULFATE 1 MG/ML IJ SOLN: 0.5000 mg | Freq: Once | INTRAMUSCULAR | Status: DC | PRN

## 2016-06-03 MED ORDER — PALONOSETRON HCL INJECTION 0.25 MG/5ML
INTRAVENOUS | Status: AC
  Filled 2016-06-03: qty 5

## 2016-06-03 MED FILL — PROCHLORPERAZINE 5 MG TAB: 5 | 7 days supply | Qty: 30 | Fill #0

## 2016-06-03 NOTE — Telephone Encounter (Signed)
OK to treat with Urine Protein 2+ per Ned Card NP

## 2016-06-03 NOTE — Telephone Encounter (Signed)
On call urologist called to discuss results of U/A.  Dr. Collene Mares at [facility] stated patients with urinary stents do not have clean u/a's and should not be treated until culture results. Patient may treat symptoms with OTC AZO while awaiting those results. This should relieve the chronic urgency resulted by the stent.  Patient and daughter located in the infusion room and advised of conversation. Pt daughter will try AZO this weekend.

## 2016-06-03 NOTE — Patient Instructions (Signed)
[facility] Discharge Instructions for Patients Receiving Chemotherapy  Today you received the following chemotherapy agents:  Irinotecan and Avastin.  To help prevent nausea and vomiting after your treatment, we encourage you to take your nausea medication as directed.   If you develop nausea and vomiting that is not controlled by your nausea medication, call the clinic.   BELOW ARE SYMPTOMS THAT SHOULD BE REPORTED IMMEDIATELY:  *FEVER GREATER THAN 100.5 F  *CHILLS WITH OR WITHOUT FEVER  NAUSEA AND VOMITING THAT IS NOT CONTROLLED WITH YOUR NAUSEA MEDICATION  *UNUSUAL SHORTNESS OF BREATH  *UNUSUAL BRUISING OR BLEEDING  TENDERNESS IN MOUTH AND THROAT WITH OR WITHOUT PRESENCE OF ULCERS  *URINARY PROBLEMS  *BOWEL PROBLEMS  UNUSUAL RASH Items with * indicate a potential emergency and should be followed up as soon as possible.  Feel free to call the clinic you have any questions or concerns. The clinic phone number is [phone].  Please show the [facility] at check-in to the Emergency Department and triage nurse.  Irinotecan injection What is this medicine? IRINOTECAN (ir in oh TEE kan ) is a chemotherapy drug. It is used to treat colon and rectal cancer. This medicine may be used for other purposes; ask your health care provider or pharmacist if you have questions. COMMON BRAND NAME(S): Camptosar What should I tell my health care provider before I take this medicine? They need to know if you have any of these conditions: -blood disorders -dehydration -diarrhea -infection (especially a virus infection such as chickenpox, cold sores, or herpes) -liver disease -low blood counts, like low white cell, platelet, or red cell counts -recent or ongoing radiation therapy -an unusual or allergic reaction to irinotecan, sorbitol, other chemotherapy, other medicines, foods, dyes, or preservatives -pregnant or trying to get pregnant -breast-feeding How should  I use this medicine? This drug is given as an infusion into a vein. It is administered in a hospital or clinic by a specially trained health care professional. Talk to your pediatrician regarding the use of this medicine in children. Special care may be needed. Overdosage: If you think you have taken too much of this medicine contact a poison control center or emergency room at once. NOTE: This medicine is only for you. Do not share this medicine with others. What if I miss a dose? It is important not to miss your dose. Call your doctor or health care professional if you are unable to keep an appointment. What may interact with this medicine? Do not take this medicine with any of the following medications: -atazanavir -certain medicines for fungal infections like itraconazole and ketoconazole -St. John's Wort This medicine may also interact with the following medications: -dexamethasone -diuretics -laxatives -medicines for seizures like carbamazepine, mephobarbital, phenobarbital, phenytoin, primidone -medicines to increase blood counts like filgrastim, pegfilgrastim, sargramostim -prochlorperazine -vaccines This list may not describe all possible interactions. Give your health care provider a list of all the medicines, herbs, non-prescription drugs, or dietary supplements you use. Also tell them if you smoke, drink alcohol, or use illegal drugs. Some items may interact with your medicine. What should I watch for while using this medicine? Your condition will be monitored carefully while you are receiving this medicine. You will need important blood work done while you are taking this medicine. This drug may make you feel generally unwell. This is not uncommon, as chemotherapy can affect healthy cells as well as cancer cells. Report any side effects. Continue your course of treatment even though  you feel ill unless your doctor tells you to stop. In some cases, you may be given additional  medicines to help with side effects. Follow all directions for their use. You may get drowsy or dizzy. Do not drive, use machinery, or do anything that needs mental alertness until you know how this medicine affects you. Do not stand or sit up quickly, especially if you are an older patient. This reduces the risk of dizzy or fainting spells. Call your doctor or health care professional for advice if you get a fever, chills or sore throat, or other symptoms of a cold or flu. Do not treat yourself. This drug decreases your body's ability to fight infections. Try to avoid being around people who are sick. This medicine may increase your risk to bruise or bleed. Call your doctor or health care professional if you notice any unusual bleeding. Be careful brushing and flossing your teeth or using a toothpick because you may get an infection or bleed more easily. If you have any dental work done, tell your dentist you are receiving this medicine. Avoid taking products that contain aspirin, acetaminophen, ibuprofen, naproxen, or ketoprofen unless instructed by your doctor. These medicines may hide a fever. Do not become pregnant while taking this medicine. Women should inform their doctor if they wish to become pregnant or think they might be pregnant. There is a potential for serious side effects to an unborn child. Talk to your health care professional or pharmacist for more information. Do not breast-feed an infant while taking this medicine. What side effects may I notice from receiving this medicine? Side effects that you should report to your doctor or health care professional as soon as possible: -allergic reactions like skin rash, itching or hives, swelling of the face, lips, or tongue -low blood counts - this medicine may decrease the number of white blood cells, red blood cells and platelets. You may be at increased risk for infections and bleeding. -signs of infection - fever or chills, cough, sore  throat, pain or difficulty passing urine -signs of decreased platelets or bleeding - bruising, pinpoint red spots on the skin, black, tarry stools, blood in the urine -signs of decreased red blood cells - unusually weak or tired, fainting spells, lightheadedness -breathing problems -chest pain -diarrhea -feeling faint or lightheaded, falls -flushing, runny nose, sweating during infusion -mouth sores or pain -pain, swelling, redness or irritation where injected -pain, swelling, warmth in the leg -pain, tingling, numbness in the hands or feet -problems with balance, talking, walking -stomach cramps, pain -trouble passing urine or change in the amount of urine -vomiting as to be unable to hold down drinks or food -yellowing of the eyes or skin Side effects that usually do not require medical attention (report to your doctor or health care professional if they continue or are bothersome): -constipation -hair loss -headache -loss of appetite -nausea, vomiting -stomach upset This list may not describe all possible side effects. Call your doctor for medical advice about side effects. You may report side effects to FDA at 1-800-FDA-1088. Where should I keep my medicine? This drug is given in a hospital or clinic and will not be stored at home. NOTE: This sheet is a summary. It may not cover all possible information. If you have questions about this medicine, talk to your doctor, pharmacist, or health care provider.  2018 Elsevier/Gold Standard (2012-08-20 16:29:32)  Bevacizumab injection What is this medicine? BEVACIZUMAB (be va SIZ yoo mab) is a monoclonal antibody. It  is used to treat many types of cancer. This medicine may be used for other purposes; ask your health care provider or pharmacist if you have questions. COMMON BRAND NAME(S): Avastin What should I tell my health care provider before I take this medicine? They need to know if you have any of these  conditions: -diabetes -heart disease -high blood pressure -history of coughing up blood -prior anthracycline chemotherapy (e.g., doxorubicin, daunorubicin, epirubicin) -recent or ongoing radiation therapy -recent or planning to have surgery -stroke -an unusual or allergic reaction to bevacizumab, hamster proteins, mouse proteins, other medicines, foods, dyes, or preservatives -pregnant or trying to get pregnant -breast-feeding How should I use this medicine? This medicine is for infusion into a vein. It is given by a health care professional in a hospital or clinic setting. Talk to your pediatrician regarding the use of this medicine in children. Special care may be needed. Overdosage: If you think you have taken too much of this medicine contact a poison control center or emergency room at once. NOTE: This medicine is only for you. Do not share this medicine with others. What if I miss a dose? It is important not to miss your dose. Call your doctor or health care professional if you are unable to keep an appointment. What may interact with this medicine? Interactions are not expected. This list may not describe all possible interactions. Give your health care provider a list of all the medicines, herbs, non-prescription drugs, or dietary supplements you use. Also tell them if you smoke, drink alcohol, or use illegal drugs. Some items may interact with your medicine. What should I watch for while using this medicine? Your condition will be monitored carefully while you are receiving this medicine. You will need important blood work and urine testing done while you are taking this medicine. This medicine may increase your risk to bruise or bleed. Call your doctor or health care professional if you notice any unusual bleeding. This medicine should be started at least 28 days following major surgery and the site of the surgery should be totally healed. Check with your doctor before scheduling  dental work or surgery while you are receiving this treatment. Talk to your doctor if you have recently had surgery or if you have a wound that has not healed. Do not become pregnant while taking this medicine or for 6 months after stopping it. Women should inform their doctor if they wish to become pregnant or think they might be pregnant. There is a potential for serious side effects to an unborn child. Talk to your health care professional or pharmacist for more information. Do not breast-feed an infant while taking this medicine and for 6 months after the last dose. This medicine has caused ovarian failure in some women. This medicine may interfere with the ability to have a child. You should talk to your doctor or health care professional if you are concerned about your fertility. What side effects may I notice from receiving this medicine? Side effects that you should report to your doctor or health care professional as soon as possible: -allergic reactions like skin rash, itching or hives, swelling of the face, lips, or tongue -chest pain or chest tightness -chills -coughing up blood -high fever -seizures -severe constipation -signs and symptoms of bleeding such as bloody or black, tarry stools; red or dark-brown urine; spitting up blood or brown material that looks like coffee grounds; red spots on the skin; unusual bruising or bleeding from the eye, gums,  or nose -signs and symptoms of a blood clot such as breathing problems; chest pain; severe, sudden headache; pain, swelling, warmth in the leg -signs and symptoms of a stroke like changes in vision; confusion; trouble speaking or understanding; severe headaches; sudden numbness or weakness of the face, arm or leg; trouble walking; dizziness; loss of balance or coordination -stomach pain -sweating -swelling of legs or ankles -vomiting -weight gain Side effects that usually do not require medical attention (report to your doctor or health  care professional if they continue or are bothersome): -back pain -changes in taste -decreased appetite -dry skin -nausea -tiredness This list may not describe all possible side effects. Call your doctor for medical advice about side effects. You may report side effects to FDA at 1-800-FDA-1088. Where should I keep my medicine? This drug is given in a hospital or clinic and will not be stored at home. NOTE: This sheet is a summary. It may not cover all possible information. If you have questions about this medicine, talk to your doctor, pharmacist, or health care provider.  2018 Elsevier/Gold Standard (2016-02-19 14:33:29)

## 2016-06-03 NOTE — Progress Notes (Signed)
[facility] OFFICE PROGRESS NOTE   Diagnosis:  Metastatic colon cancer Oncology History   Cancer of right colon [facility])   Staging form: Colon and Rectum, AJCC 7th Edition     Pathologic stage from 01/23/2015: Stage IIC (T4b, N0, cM0) - Signed by Beth Merle, MD on 02/05/2015       Cancer of right colon ([place])   01/21/2015 Imaging     CT abdomen and pelvis showed high-grade small bowel obstruction,  right I [place] ureteral nephrosis,  multiple liver cysts.  CT chest was negative for metastatic lesion.      01/23/2015 Pathology Results     invasive adenocarcinoma extending into the pericecal connective tissue,  grade 2, LVI (-),  no perineural invasion, 3 lymph nodes were negative.      01/23/2015 Surgery      terminal ileum and cecum seegmental resection with anastomosis by Dr. Hassell Wilkins      01/23/2015 Initial Diagnosis    Cancer of right colon ([place])      03/13/2015 - 07/06/2015 Chemotherapy    Xeloda 1500 mg bid X 14 days on-7 off, s/p 5 cycles, stopped early due to wrosening renal function      10/28/2015 Progression    PET scan showed multiple hypermetabolic peritoneal nodules, right ovarian cystic mass, and hypermetabolic liver lesion, highly suspicious for metastatic colon cancer. Small pulmonary nodules are indeterminate.       11/11/2015 -  Chemotherapy    Xeloda '1500mg'$  bid, 2 weeks on and one week off, Avastin was added on from cycle 3       01/08/2016 Imaging    Ct chest, Abdomen Pelvis Wo Contrast 01/08/2016  IMPRESSION: 1. 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 2. Hepatic and renal cysts. 3. Aortoiliac atherosclerotic vascular disease. 4. Lumbar spondylosis and degenerative disc disease with multilevel Impingement.      05/19/2016 Imaging    CT CAP w Contrast IMPRESSION: 1. Numerous small solid pulmonary nodules, one of which is new,  and several of which have mildly increased in size since 10/28/2015, worrisome for mild progression of pulmonary metastases. 2. Solitary right liver lobe metastasis is mildly increased in size since 01/08/2016. 3. Peritoneal metastases have mildly increased in size since 01/08/2016, largest in the right pelvic sidewall. 4. Well-positioned right nephroureteral stent without overt right hydronephrosis. 5. Additional findings include aortic atherosclerosis, 1 vessel coronary atherosclerosis and mild to moderate emphysema.       INTERVAL HISTORY:   Beth Wilkins returns as scheduled. Following her last visit with Dr. Barnett Wilkins was discontinued due to disease progression. She received Avastin as planned on 05/20/2016. She is scheduled to begin irinotecan today.  She denies nausea/vomiting. No diarrhea. Appetite is unchanged. Stable mild dyspnea on exertion. No bleeding. No chest pain. Her daughter reports she has been complaining of pain with urination since the ureter stent was exchanged about 3 weeks ago. No fever.  Objective:  Vital signs in last 24 hours:  Blood pressure (!) 154/71, pulse 88, temperature 98 F (36.7 C), temperature source Oral, resp. rate 18, height '5\' 6"'$  (1.676 m), weight 123 lb 12.8 oz (56.2 kg), SpO2 100 %.    HEENT: No thrush or ulcers. Resp: Lungs clear bilaterally. Cardio: Regular rate and rhythm. GI: Abdomen soft and nontender. No hepatomegaly. Vascular: No leg edema. Neuro: Alert. Confused.    Lab Results:  Lab Results  Component Value Date   WBC 13.5 (H) 06/03/2016  HGB 12.5 06/03/2016   HCT 37.2 06/03/2016   MCV 97.7 06/03/2016   PLT 418 (H) 06/03/2016   NEUTROABS 11.3 (H) 06/03/2016    Imaging:  No results found.  Medications: I have reviewed the patient's current medications.  Assessment/Plan: 1. 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 2. Chronic kidney disease stage III, right hydroureter; ureter stent placement October 2017, January 2018 and February 2018. Creatinine is stable today. 3. Hypertension, dementia 4. Hyperbilirubinemia. Bilirubin normal today. 5. Goals of care discussion. Beth Wilkins has previously discussed the incurable nature of her cancer and the overall poor prognosis especially if she progresses on chemotherapy. She understands the goal of care is palliative.   Disposition: Beth Wilkins appears stable. Plan to proceed with irinotecan/Avastin today as scheduled. Potential toxicities again reviewed. I sent a prescription to her pharmacy for Compazine 5 mg every 6 hours as needed for nausea.  She has been experiencing dysuria since the ureter stent was exchanged. We will check a urinalysis and culture today. Her daughter understands the urinalysis can be difficult to interpret when someone has a ureter stent.  She will return for a follow-up visit and the next cycle of irinotecan/Avastin in 2 weeks. Her daughter will contact the office in the interim with any problems.    Ned Card ANP/GNP-BC   06/03/2016  12:07 PM

## 2016-06-04 LAB — URINE CULTURE: Organism ID, Bacteria: NO GROWTH

## 2016-06-17 ENCOUNTER — Telehealth: Payer: Self-pay | Admitting: Hematology

## 2016-06-17 ENCOUNTER — Ambulatory Visit (HOSPITAL_BASED_OUTPATIENT_CLINIC_OR_DEPARTMENT_OTHER): Payer: Medicare Other

## 2016-06-17 ENCOUNTER — Ambulatory Visit (HOSPITAL_BASED_OUTPATIENT_CLINIC_OR_DEPARTMENT_OTHER): Payer: Medicare Other | Admitting: Hematology

## 2016-06-17 ENCOUNTER — Other Ambulatory Visit (HOSPITAL_BASED_OUTPATIENT_CLINIC_OR_DEPARTMENT_OTHER): Payer: Medicare Other

## 2016-06-17 ENCOUNTER — Encounter: Payer: Self-pay | Admitting: Hematology

## 2016-06-17 VITALS — BP 167/73 | HR 56 | Temp 98.6°F | Resp 18 | Ht 66.0 in | Wt 125.7 lb

## 2016-06-17 VITALS — BP 145/76

## 2016-06-17 DIAGNOSIS — Z5112 Encounter for antineoplastic immunotherapy: Secondary | ICD-10-CM | POA: Diagnosis not present

## 2016-06-17 DIAGNOSIS — Z5111 Encounter for antineoplastic chemotherapy: Secondary | ICD-10-CM | POA: Diagnosis not present

## 2016-06-17 DIAGNOSIS — I10 Essential (primary) hypertension: Secondary | ICD-10-CM | POA: Diagnosis not present

## 2016-06-17 DIAGNOSIS — C182 Malignant neoplasm of ascending colon: Secondary | ICD-10-CM

## 2016-06-17 DIAGNOSIS — Z79899 Other long term (current) drug therapy: Secondary | ICD-10-CM

## 2016-06-17 DIAGNOSIS — N183 Chronic kidney disease, stage 3 (moderate): Secondary | ICD-10-CM | POA: Diagnosis not present

## 2016-06-17 DIAGNOSIS — D638 Anemia in other chronic diseases classified elsewhere: Secondary | ICD-10-CM

## 2016-06-17 DIAGNOSIS — C180 Malignant neoplasm of cecum: Secondary | ICD-10-CM | POA: Diagnosis not present

## 2016-06-17 DIAGNOSIS — F0390 Unspecified dementia without behavioral disturbance: Secondary | ICD-10-CM

## 2016-06-17 LAB — CBC WITH DIFFERENTIAL/PLATELET
BASO%: 1 % (ref 0.0–2.0)
BASOS ABS: 0.1 10*3/uL (ref 0.0–0.1)
EOS%: 5.1 % (ref 0.0–7.0)
Eosinophils Absolute: 0.3 10*3/uL (ref 0.0–0.5)
HCT: 36.4 % (ref 34.8–46.6)
HGB: 12.2 g/dL (ref 11.6–15.9)
LYMPH#: 1.5 10*3/uL (ref 0.9–3.3)
LYMPH%: 25.9 % (ref 14.0–49.7)
MCH: 31.9 pg (ref 25.1–34.0)
MCHC: 33.6 g/dL (ref 31.5–36.0)
MCV: 94.7 fL (ref 79.5–101.0)
MONO#: 0.6 10*3/uL (ref 0.1–0.9)
MONO%: 10.1 % (ref 0.0–14.0)
NEUT#: 3.4 10*3/uL (ref 1.5–6.5)
NEUT%: 57.9 % (ref 38.4–76.8)
PLATELETS: 447 10*3/uL — AB (ref 145–400)
RBC: 3.84 10*6/uL (ref 3.70–5.45)
RDW: 17.3 % — ABNORMAL HIGH (ref 11.2–14.5)
WBC: 5.8 10*3/uL (ref 3.9–10.3)

## 2016-06-17 LAB — COMPREHENSIVE METABOLIC PANEL
ALT: 10 U/L (ref 0–55)
ANION GAP: 9 meq/L (ref 3–11)
AST: 17 U/L (ref 5–34)
Albumin: 3.3 g/dL — ABNORMAL LOW (ref 3.5–5.0)
Alkaline Phosphatase: 81 U/L (ref 40–150)
BUN: 16.8 mg/dL (ref 7.0–26.0)
CALCIUM: 9.5 mg/dL (ref 8.4–10.4)
CHLORIDE: 106 meq/L (ref 98–109)
CO2: 24 mEq/L (ref 22–29)
Creatinine: 1.3 mg/dL — ABNORMAL HIGH (ref 0.6–1.1)
EGFR: 46 mL/min/{1.73_m2} — AB (ref >90)
Glucose: 69 mg/dl — ABNORMAL LOW (ref 70–140)
POTASSIUM: 4.3 meq/L (ref 3.5–5.1)
Sodium: 140 mEq/L (ref 136–145)
Total Bilirubin: 0.36 mg/dL (ref 0.20–1.20)
Total Protein: 7.9 g/dL (ref 6.4–8.3)

## 2016-06-17 LAB — CEA (IN HOUSE-CHCC): CEA (CHCC-IN HOUSE): 6.45 ng/mL — AB (ref 0.00–5.00)

## 2016-06-17 LAB — UA PROTEIN, DIPSTICK - CHCC: PROTEIN: 100 mg/dL

## 2016-06-17 MED ORDER — PALONOSETRON HCL INJECTION 0.25 MG/5ML
0.2500 mg | Freq: Once | INTRAVENOUS | Status: AC
  Administered 2016-06-17: 0.25 mg via INTRAVENOUS

## 2016-06-17 MED ORDER — DEXAMETHASONE SODIUM PHOSPHATE 10 MG/ML IJ SOLN
10.0000 mg | Freq: Once | INTRAMUSCULAR | Status: AC
  Administered 2016-06-17: 10 mg via INTRAVENOUS

## 2016-06-17 MED ORDER — SODIUM CHLORIDE 0.9 % IV SOLN
5.0000 mg/kg | Freq: Once | INTRAVENOUS | Status: AC
  Administered 2016-06-17: 300 mg via INTRAVENOUS
  Filled 2016-06-17: qty 12

## 2016-06-17 MED ORDER — IRINOTECAN HCL CHEMO INJECTION 100 MG/5ML
150.0000 mg/m2 | Freq: Once | INTRAVENOUS | Status: AC
  Administered 2016-06-17: 240 mg via INTRAVENOUS
  Filled 2016-06-17: qty 10

## 2016-06-17 MED ORDER — SODIUM CHLORIDE 0.9 % IV SOLN
Freq: Once | INTRAVENOUS | Status: DC
Start: 2016-06-17 — End: 2016-06-17

## 2016-06-17 MED ORDER — SODIUM CHLORIDE 0.9 % IV SOLN
Freq: Once | INTRAVENOUS | Status: AC
  Administered 2016-06-17: 16:00:00 via INTRAVENOUS

## 2016-06-17 MED ORDER — DEXAMETHASONE SODIUM PHOSPHATE 10 MG/ML IJ SOLN
INTRAMUSCULAR | Status: AC
  Filled 2016-06-17: qty 1

## 2016-06-17 MED ORDER — PALONOSETRON HCL INJECTION 0.25 MG/5ML
INTRAVENOUS | Status: AC
  Filled 2016-06-17: qty 5

## 2016-06-17 NOTE — Patient Instructions (Signed)
[place] Discharge Instructions for Patients Receiving Chemotherapy  Today you received the following chemotherapy agents:  Irinotecan and Avastin.  To help prevent nausea and vomiting after your treatment, we encourage you to take your nausea medication as directed.   If you develop nausea and vomiting that is not controlled by your nausea medication, call the clinic.   BELOW ARE SYMPTOMS THAT SHOULD BE REPORTED IMMEDIATELY:  *FEVER GREATER THAN 100.5 F  *CHILLS WITH OR WITHOUT FEVER  NAUSEA AND VOMITING THAT IS NOT CONTROLLED WITH YOUR NAUSEA MEDICATION  *UNUSUAL SHORTNESS OF BREATH  *UNUSUAL BRUISING OR BLEEDING  TENDERNESS IN MOUTH AND THROAT WITH OR WITHOUT PRESENCE OF ULCERS  *URINARY PROBLEMS  *BOWEL PROBLEMS  UNUSUAL RASH Items with * indicate a potential emergency and should be followed up as soon as possible.  Feel free to call the clinic you have any questions or concerns. The clinic phone number is [phone].  Please show the [place] at check-in to the Emergency Department and triage nurse.  Irinotecan injection What is this medicine? IRINOTECAN (ir in oh TEE kan ) is a chemotherapy drug. It is used to treat colon and rectal cancer. This medicine may be used for other purposes; ask your health care provider or pharmacist if you have questions. COMMON BRAND NAME(S): Camptosar What should I tell my health care provider before I take this medicine? They need to know if you have any of these conditions: -blood disorders -dehydration -diarrhea -infection (especially a virus infection such as chickenpox, cold sores, or herpes) -liver disease -low blood counts, like low white cell, platelet, or red cell counts -recent or ongoing radiation therapy -an unusual or allergic reaction to irinotecan, sorbitol, other chemotherapy, other medicines, foods, dyes, or preservatives -pregnant or trying to get pregnant -breast-feeding How should  I use this medicine? This drug is given as an infusion into a vein. It is administered in a hospital or clinic by a specially trained health care professional. Talk to your pediatrician regarding the use of this medicine in children. Special care may be needed. Overdosage: If you think you have taken too much of this medicine contact a poison control center or emergency room at once. NOTE: This medicine is only for you. Do not share this medicine with others. What if I miss a dose? It is important not to miss your dose. Call your doctor or health care professional if you are unable to keep an appointment. What may interact with this medicine? Do not take this medicine with any of the following medications: -atazanavir -certain medicines for fungal infections like itraconazole and ketoconazole -St. John's Wort This medicine may also interact with the following medications: -dexamethasone -diuretics -laxatives -medicines for seizures like carbamazepine, mephobarbital, phenobarbital, phenytoin, primidone -medicines to increase blood counts like filgrastim, pegfilgrastim, sargramostim -prochlorperazine -vaccines This list may not describe all possible interactions. Give your health care provider a list of all the medicines, herbs, non-prescription drugs, or dietary supplements you use. Also tell them if you smoke, drink alcohol, or use illegal drugs. Some items may interact with your medicine. What should I watch for while using this medicine? Your condition will be monitored carefully while you are receiving this medicine. You will need important blood work done while you are taking this medicine. This drug may make you feel generally unwell. This is not uncommon, as chemotherapy can affect healthy cells as well as cancer cells. Report any side effects. Continue your course of treatment even though  you feel ill unless your doctor tells you to stop. In some cases, you may be given additional  medicines to help with side effects. Follow all directions for their use. You may get drowsy or dizzy. Do not drive, use machinery, or do anything that needs mental alertness until you know how this medicine affects you. Do not stand or sit up quickly, especially if you are an older patient. This reduces the risk of dizzy or fainting spells. Call your doctor or health care professional for advice if you get a fever, chills or sore throat, or other symptoms of a cold or flu. Do not treat yourself. This drug decreases your body's ability to fight infections. Try to avoid being around people who are sick. This medicine may increase your risk to bruise or bleed. Call your doctor or health care professional if you notice any unusual bleeding. Be careful brushing and flossing your teeth or using a toothpick because you may get an infection or bleed more easily. If you have any dental work done, tell your dentist you are receiving this medicine. Avoid taking products that contain aspirin, acetaminophen, ibuprofen, naproxen, or ketoprofen unless instructed by your doctor. These medicines may hide a fever. Do not become pregnant while taking this medicine. Women should inform their doctor if they wish to become pregnant or think they might be pregnant. There is a potential for serious side effects to an unborn child. Talk to your health care professional or pharmacist for more information. Do not breast-feed an infant while taking this medicine. What side effects may I notice from receiving this medicine? Side effects that you should report to your doctor or health care professional as soon as possible: -allergic reactions like skin rash, itching or hives, swelling of the face, lips, or tongue -low blood counts - this medicine may decrease the number of white blood cells, red blood cells and platelets. You may be at increased risk for infections and bleeding. -signs of infection - fever or chills, cough, sore  throat, pain or difficulty passing urine -signs of decreased platelets or bleeding - bruising, pinpoint red spots on the skin, black, tarry stools, blood in the urine -signs of decreased red blood cells - unusually weak or tired, fainting spells, lightheadedness -breathing problems -chest pain -diarrhea -feeling faint or lightheaded, falls -flushing, runny nose, sweating during infusion -mouth sores or pain -pain, swelling, redness or irritation where injected -pain, swelling, warmth in the leg -pain, tingling, numbness in the hands or feet -problems with balance, talking, walking -stomach cramps, pain -trouble passing urine or change in the amount of urine -vomiting as to be unable to hold down drinks or food -yellowing of the eyes or skin Side effects that usually do not require medical attention (report to your doctor or health care professional if they continue or are bothersome): -constipation -hair loss -headache -loss of appetite -nausea, vomiting -stomach upset This list may not describe all possible side effects. Call your doctor for medical advice about side effects. You may report side effects to FDA at 1-800-FDA-1088. Where should I keep my medicine? This drug is given in a hospital or clinic and will not be stored at home. NOTE: This sheet is a summary. It may not cover all possible information. If you have questions about this medicine, talk to your doctor, pharmacist, or health care provider.  2018 [employer] Standard (2012-08-20 16:29:32)  Bevacizumab injection What is this medicine? BEVACIZUMAB (be va SIZ yoo mab) is a monoclonal antibody. It  is used to treat many types of cancer. This medicine may be used for other purposes; ask your health care provider or pharmacist if you have questions. COMMON BRAND NAME(S): Avastin What should I tell my health care provider before I take this medicine? They need to know if you have any of these  conditions: -diabetes -heart disease -high blood pressure -history of coughing up blood -prior anthracycline chemotherapy (e.g., doxorubicin, daunorubicin, epirubicin) -recent or ongoing radiation therapy -recent or planning to have surgery -stroke -an unusual or allergic reaction to bevacizumab, hamster proteins, mouse proteins, other medicines, foods, dyes, or preservatives -pregnant or trying to get pregnant -breast-feeding How should I use this medicine? This medicine is for infusion into a vein. It is given by a health care professional in a hospital or clinic setting. Talk to your pediatrician regarding the use of this medicine in children. Special care may be needed. Overdosage: If you think you have taken too much of this medicine contact a poison control center or emergency room at once. NOTE: This medicine is only for you. Do not share this medicine with others. What if I miss a dose? It is important not to miss your dose. Call your doctor or health care professional if you are unable to keep an appointment. What may interact with this medicine? Interactions are not expected. This list may not describe all possible interactions. Give your health care provider a list of all the medicines, herbs, non-prescription drugs, or dietary supplements you use. Also tell them if you smoke, drink alcohol, or use illegal drugs. Some items may interact with your medicine. What should I watch for while using this medicine? Your condition will be monitored carefully while you are receiving this medicine. You will need important blood work and urine testing done while you are taking this medicine. This medicine may increase your risk to bruise or bleed. Call your doctor or health care professional if you notice any unusual bleeding. This medicine should be started at least 28 days following major surgery and the site of the surgery should be totally healed. Check with your doctor before scheduling  dental work or surgery while you are receiving this treatment. Talk to your doctor if you have recently had surgery or if you have a wound that has not healed. Do not become pregnant while taking this medicine or for 6 months after stopping it. Women should inform their doctor if they wish to become pregnant or think they might be pregnant. There is a potential for serious side effects to an unborn child. Talk to your health care professional or pharmacist for more information. Do not breast-feed an infant while taking this medicine and for 6 months after the last dose. This medicine has caused ovarian failure in some women. This medicine may interfere with the ability to have a child. You should talk to your doctor or health care professional if you are concerned about your fertility. What side effects may I notice from receiving this medicine? Side effects that you should report to your doctor or health care professional as soon as possible: -allergic reactions like skin rash, itching or hives, swelling of the face, lips, or tongue -chest pain or chest tightness -chills -coughing up blood -high fever -seizures -severe constipation -signs and symptoms of bleeding such as bloody or black, tarry stools; red or dark-brown urine; spitting up blood or brown material that looks like coffee grounds; red spots on the skin; unusual bruising or bleeding from the eye, gums,  or nose -signs and symptoms of a blood clot such as breathing problems; chest pain; severe, sudden headache; pain, swelling, warmth in the leg -signs and symptoms of a stroke like changes in vision; confusion; trouble speaking or understanding; severe headaches; sudden numbness or weakness of the face, arm or leg; trouble walking; dizziness; loss of balance or coordination -stomach pain -sweating -swelling of legs or ankles -vomiting -weight gain Side effects that usually do not require medical attention (report to your doctor or health  care professional if they continue or are bothersome): -back pain -changes in taste -decreased appetite -dry skin -nausea -tiredness This list may not describe all possible side effects. Call your doctor for medical advice about side effects. You may report side effects to FDA at 1-800-FDA-1088. Where should I keep my medicine? This drug is given in a hospital or clinic and will not be stored at home. NOTE: This sheet is a summary. It may not cover all possible information. If you have questions about this medicine, talk to your doctor, pharmacist, or health care provider.  2018 [employer]/Gold Standard (2016-02-19 14:33:29)

## 2016-06-17 NOTE — Progress Notes (Signed)
[place]  [phone] [phone]  Clinic Follow Up Note   Patient Care Team: [name], MD as PCP - General (Family Medicine) [name], MD as Consulting Physician (Medical Oncology) [name], MD as Consulting Physician (General Surgery) [name], MD as Consulting Physician (Orthopedic Surgery) Cleon Gustin, MD as Consulting Physician (Urology) 06/17/2016     CHIEF COMPLAINTS:  Follow up metastatic colon cancer  Oncology History   Cancer of right colon [facility]   Staging form: Colon and Rectum, AJCC 7th Edition     Pathologic stage from 01/23/2015: Stage IIC (T4b, N0, cM0) - Signed by [name], MD on 02/05/2015       Cancer of right colon ([place])   01/21/2015 Imaging     CT abdomen and pelvis showed high-grade small bowel obstruction,  right I [place] ureteral nephrosis,  multiple liver cysts.  CT chest was negative for metastatic lesion.      01/23/2015 Pathology Results     invasive adenocarcinoma extending into the pericecal connective tissue,  grade 2, LVI (-),  no perineural invasion, 3 lymph nodes were negative.      01/23/2015 Surgery      terminal ileum and cecum seegmental resection with anastomosis by Dr. Hassell Done      01/23/2015 Initial Diagnosis    Cancer of right colon ([place])      03/13/2015 - 07/06/2015 Chemotherapy    Xeloda 1500 mg bid X 14 days on-7 off, s/p 5 cycles, stopped early due to wrosening renal function      10/28/2015 Progression    PET scan showed multiple hypermetabolic peritoneal nodules, right ovarian cystic mass, and hypermetabolic liver lesion, highly suspicious for metastatic colon cancer. Small pulmonary nodules are indeterminate.       11/11/2015 - 05/20/2016 Chemotherapy    Xeloda 1573m bid, 2 weeks on and one week off, Avastin was added on from cycle 3. Xeloda held from 05/20/16 due to progression on CT from 05/19/16 and total bilirubin increased to 2.6.      01/08/2016 Imaging    Ct  chest, Abdomen Pelvis Wo Contrast 01/08/2016  IMPRESSION: 1. 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 2. Hepatic and renal cysts. 3.  Aortoiliac atherosclerotic vascular disease. 4. Lumbar spondylosis and degenerative disc disease with multilevel Impingement.      05/19/2016 Imaging    CT CAP w Contrast IMPRESSION: 1. Numerous small solid pulmonary nodules, one of which is new, and several of which have mildly increased in size since 10/28/2015, worrisome for mild progression of pulmonary metastases. 2. Solitary right liver lobe metastasis is mildly increased in size since 01/08/2016. 3. Peritoneal metastases have mildly increased in size since 01/08/2016, largest in the right pelvic sidewall. 4. Well-positioned right nephroureteral stent without overt right hydronephrosis. 5. Additional findings include aortic atherosclerosis, 1 vessel coronary atherosclerosis and mild to moderate emphysema.      06/03/2016 -  Chemotherapy    Irinotecan and Avastin every 2 weeks starting 06/03/16       HISTORY OF PRESENTING ILLNESS (02/05/2015):  [name]89y.o. female  with past medical history of moderate dementia, hypertension, is here because of recently diagnosed stage II colon cancer. She is accompanied to the clinic by her daughter. History is mainly obtained from the chart and her daughter.  She has had abdominal discomfort, nausea and vomiting, and norexia for  the past to 3 months along with 15 pounds weight loss, .  Her nausea was mild and intermittent, she does not seek medical attention initially. She presented with  worsening symptoms for 2-3 weeks, and episodes of projectile vomiting and dehydration to emergency room, was admitted to [facility] on 01/20/2015. CT scan reviewed small bowel obstruction, and possible appendiceal rupture. She was brought to OR on 01/23/2015, underwent right hemicolectomy with anastomosis  by Dr. Hassell Done. Surgical path reviewed adenocarcinoma at the cecum. She was discharged home on 01/26/2015.  She has recovered well, eating much better than prior to surgery, move around without restriction. No pain, she has loose BM 3 times a day, and she has backed off her mirealax.  she lives with her daughter. She states her appetite is moderate, sometime low, and she refuses to eat if she doesn't have appetite. No nausea, vomiting, abdominal bloating since her surgery.  Her last colonoscopy was 6-8 years ago, and her stool OB was negative this year.   CURRENT THERAPY: Irinotecan and Avastin every 2 weeks starting 06/03/16  INTERIM HISTORY: Beth Wilkins returns for follow-up and 2nd cycle Irinotecan and Avastin with her daughter. She has been doing well. The patient had some diarrhea and nausea after chemotherapy. Imodium and nausea medication resolved these issues. The patient's daughter reports the patient finished taking antibiotics last Sunday for a UTI. She denies dysuria at this time. The patient's daughter also stated that her mother has a sore mouth.  MEDICAL HISTORY:  Past Medical History:  Diagnosis Date  . Anxiety   . Arthritis   . Bilateral renal cysts   . Centrilobular emphysema ([place])   . Chronic low back pain   . CKD (chronic kidney disease), stage III   . Colon cancer [facility]) oncologist-  dr [name]   dx 01-23-2015  right colon Invasive adenocarcinoma into pericecal connective tissue and involving small intestine , Stage IIc, Grade 2, LVI(-),  (pT4b N0 M0) /  s/p resection termial ileum and cecum and chemotherapy 01-06-217 to 07-06-2015/   08/ 2017  per PET  recurrent w/ liver mets and peritoneal carcinomatosis  . Complication of anesthesia    can be combative due to dementia  . Full dentures   . History of gastroesophageal reflux (GERD)    changes diet  . History of pelvic fracture   . Hyperlipidemia   . Hypertension   . Liver metastasis ([place])   . Moderate dementia without  behavioral disturbance    moderate to severe dementia per neurologist note (dr Krista Blue) in epic  . Pulmonary nodules   . Right ovarian cyst   . Ureteral obstruction, right urologist-  dr [name]   malignant obstruction right ureter w/ colon cancer -- treated with ureteral stent placement    SURGICAL HISTORY: Past Surgical History:  Procedure Laterality Date  . CATARACT EXTRACTION W/ INTRAOCULAR LENS  IMPLANT, BILATERAL  2005 approx  . CYSTOSCOPY W/ URETERAL STENT PLACEMENT Right 12/03/2015   Procedure: CYSTOSCOPY WITH RETROGRADE PYELOGRAM/URETERAL STENT PLACEMENT;  Surgeon: Cleon Gustin, MD;  Location: [facility];  Service: Urology;  Laterality: Right;  . CYSTOSCOPY W/ URETERAL STENT PLACEMENT Right 02/15/2016   Procedure: CYSTOSCOPY WITH RETROGRADE PYELOGRAM/URETERAL STENT REPLACEMENT;  Surgeon: Cleon Gustin, MD;  Location: [facility];  Service: Urology;  Laterality: Right;  . CYSTOSCOPY W/ URETERAL STENT PLACEMENT Right 03/28/2016   Procedure: CYSTOSCOPY WITH RETROGRADE PYELOGRAM/URETERAL STENT EXCHANGE;  Surgeon: Cleon Gustin, MD;  Location: [facility];  Service: Urology;  Laterality: Right;  . CYSTOSCOPY WITH RETROGRADE PYELOGRAM, URETEROSCOPY  AND STENT PLACEMENT Right 09/21/2015   Procedure: CYSTOSCOPY WITH BIL RETROGRADE PYELOGRAM, URETEROSCOPY, RIGHT STENT PLACEMENT ;  Surgeon: Cleon Gustin, MD;  Location: [facility];  Service: Urology;  Laterality: Right;  . LAPAROSCOPY N/A 01/23/2015   Procedure: DIAGNOSTIC LAPAROSCOPY, EXPLORATORY LAPAROTOMY  RIGHT HEMI-COLECTOMY FOR OBSTRUCTION OF RIGHT TERMINAL ILEUM;  Surgeon: [name], MD;  Location: [facility];  Service: General;  Laterality: N/A;    SOCIAL HISTORY: Social History   Social History  . Marital status: Single    Spouse name: N/A  . Number of children: N/A  . Years of education: N/A   Occupational History  . Not on file.   Social History  Main Topics  . Smoking status: Former Smoker    Packs/day: 0.25    Years: 50.00    Types: Cigarettes    Quit date: 01/25/2015  . Smokeless tobacco: Never Used  . Alcohol use 1.8 oz/week    2 Glasses of wine, 1 Cans of beer per week     Comment: occasional   . Drug use: No  . Sexual activity: Not on file   Other Topics Concern  . Not on file   Social History Narrative   Legally separated   Lives w/daughter, Beth Wilkins   Has total of #2 daughters and #1 son   Dementia   Fall Risk-ambulates w/cane    FAMILY HISTORY: Family History  Problem Relation Age of Onset  . Diabetes Mellitus II Father   . Cancer Mother 66    ovarian cancer   . Cervical cancer Other     ALLERGIES:  is allergic to namenda [memantine hcl] and oxycodone.  MEDICATIONS:  Current Outpatient Prescriptions  Medication Sig Dispense Refill  . amLODipine (NORVASC) 5 MG tablet Take 5 mg by mouth every morning.     Marland Kitchen antiseptic oral rinse (BIOTENE) LIQD 15 mLs by Mouth Rinse route daily. BIOTENE CLINICAL    . Cholecalciferol (VITAMIN D3) 2000 UNITS TABS Take by mouth daily.    . Coenzyme Q10 (CO Q 10) 100 MG CAPS Take by mouth daily.    Marland Kitchen donepezil (ARICEPT) 10 MG tablet TAKE 1 TABLET BY MOUTH EVERY DAY 30 tablet 11  . mirabegron ER (MYRBETRIQ) 50 MG TB24 tablet Take 50 mg by mouth daily.    . mirtazapine (REMERON) 15 MG tablet Take 1 tablet (15 mg total) by mouth at bedtime. 30 tablet 3  . Multiple Vitamins-Minerals (WOMENS MULTIVITAMIN PO) Take 1 tablet by mouth daily.    . polyethylene glycol (MIRALAX / GLYCOLAX) packet Take 17 g by mouth daily. (Patient taking differently: Take 17 g by mouth daily as needed. ) 14 each 0  . prochlorperazine (COMPAZINE) 5 MG tablet Take 1 tablet (5 mg total) by mouth every 6 (six) hours as needed for nausea or vomiting. 30 tablet 0  . simvastatin (ZOCOR) 20 MG tablet Take 20 mg by mouth daily at 6 PM.    . traMADol (ULTRAM) 50 MG tablet Take 1 tablet (50 mg total) by  mouth every 6 (six) hours as needed. 30 tablet 0   No current facility-administered medications for this visit.     REVIEW OF SYSTEMS:  Constitutional: Denies fevers, chills or abnormal night sweats (+) fatigue Eyes: Denies blurriness of vision, double vision or watery eyes Ears, nose, mouth, throat, and face: Denies mucositis or sore throat (+) Sore mouth Respiratory: Denies cough, dyspnea or wheezes Cardiovascular: Denies palpitation, chest discomfort or lower extremity swelling Gastrointestinal:  Denies  heartburn (+) diarrhea (+) Nausea Skin: Denies abnormal skin rashes Lymphatics: Denies new lymphadenopathy or easy bruising Neurological:Denies numbness, tingling or new weaknesses Behavioral/Psych: Mood is stable, no new changes  All other systems were reviewed with the patient and are negative.  PHYSICAL EXAMINATION: ECOG PERFORMANCE STATUS: 1 - Symptomatic but completely ambulatory  Vitals:   06/17/16 1336  BP: (!) 167/73  Pulse: (!) 56  Resp: 18  Temp: 98.6 F (37 C)   Filed Weights   06/17/16 1336  Weight: 125 lb 11.2 oz (57 kg)   GENERAL:alert, no distress and comfortable SKIN: skin color, texture, turgor are normal, no rashes or significant lesions EYES: normal, conjunctiva are pink and non-injected, sclera clear OROPHARYNX:no exudate and tongue normal (+) Upper and lower dentures (+) mild erythema of the mucous of the left side of her mouth NECK: supple, thyroid normal size, non-tender, without nodularity LYMPH:  no palpable lymphadenopathy in the cervical, axillary or inguinal LUNGS: clear to auscultation and percussion with normal breathing effort HEART: regular rate & rhythm and no murmurs and no lower extremity edema ABDOMEN:abdomen soft, non-tender and normal bowel sounds Musculoskeletal:no cyanosis of digits and no clubbing  PSYCH: alert & oriented x 3 with fluent speech NEURO: no focal motor/sensory deficits  LABORATORY DATA:  I have reviewed the data  as listed CBC Latest Ref Rng & Units 06/17/2016 06/03/2016 05/20/2016  WBC 3.9 - 10.3 10e3/uL 5.8 13.5(H) 9.1  Hemoglobin 11.6 - 15.9 g/dL 12.2 12.5 13.1  Hematocrit 34.8 - 46.6 % 36.4 37.2 38.7  Platelets 145 - 400 10e3/uL 447(H) 418(H) 187    CMP Latest Ref Rng & Units 06/17/2016 06/03/2016 05/20/2016  Glucose 70 - 140 mg/dl 69(L) 94 93  BUN 7.0 - 26.0 mg/dL 16.8 15.9 15.0  Creatinine 0.6 - 1.1 mg/dL 1.3(H) 1.5(H) 1.5(H)  Sodium 136 - 145 mEq/L 140 140 141  Potassium 3.5 - 5.1 mEq/L 4.3 3.8 3.2(L)  Chloride 101 - 111 mmol/L - - -  CO2 22 - 29 mEq/L _0 Calcium 8.4 - 10.4 mg/dL 9.5 9.8 9.4  Total Protein 6.4 - 8.3 g/dL 7.9 8.2 7.5  Total Bilirubin 0.20 - 1.20 mg/dL 0.36 0.70 2.61(H)  Alkaline Phos 40 - 150 U/L 81 91 72  AST 5 - 34 U/L _1 ALT 0 - 55 U/L _2 CEA  01/23/2015: 5.7 06/05/2015: 6.1 08/04/2015: 10.3 10/12/2015: 13.29 (in-house)  11/04/2015: 12.83 01/13/2016: 8.57 02/24/2016: 8.41 04/27/2016: 9.22 05/20/2016: 8.32 06/17/2016: 6.45  PATHOLOGY REPORT: Diagnosis 01/23/2015 Colon, segmental resection, with terminal ileum and cecum - INVASIVE ADENOCARCINOMA EXTENDING INTO PERICECAL CONNECTIVE TISSUE AND INVOLVING SMALL INTESTINE. - THREE BENIGN LYMPH NODES (0/3). - SEE ONCOLOGY TABLE BELOW.  Microscopic Comment COLON AND RECTUM (INCLUDING TRANS-ANAL RESECTION): Specimen: Terminal ileum and cecum. Procedure: Resection. Tumor site: Cecum adjacent to appendix. Specimen integrity: Intact. Macroscopic intactness of mesorectum: Not applicable. Macroscopic tumor perforation: No. Invasive tumor: Maximum size: 4.0 cm. Histologic type(s): Colorectal adenocarcinoma. Histologic grade and differentiation: G2: moderately differentiated/low grade. Type of polyp in which invasive carcinoma arose: No residual polyp. Microscopic extension of invasive tumor: Into pericecal connective tissue and terminal ileum. Lymph-Vascular invasion: Not identified. Peri-neural  invasion: Not identified. Tumor deposit(s) (discontinuous extramural extension): No. Resection margins: Proximal margin: Free of tumor. Distal margin: Free of tumor. Circumferential (radial) (posterior ascending, posterior descending; lateral and posterior mid-rectum; and entire lower 1/3 rectum): Free of tumor. Mesenteric margin (sigmoid and transverse): N/A. Distance closest margin (if all above margins  negative): N/A. Treatment effect (neo-adjuvant therapy): No. Additional polyp(s): No. Non-neoplastic findings: N/A. Lymph nodes: number examined 3; number positive: 0. Pathologic Staging: pT4b, pN0, pMX. Ancillary studies: Mismatch repair protein by immunohistochemistry pending. Comments: There is a colorectal-type adenocarcinoma arising in the cecum in the area of the appendiceal orifice. The tumor extends into the pericecal connective tissue, and involves the attached segment of terminal ileum. Immunohistochemistry shows the tumor is positive with CDX2 and cytokeratin 20, and negative with cytokeratin 7, estrogen receptor and WT-1 supporting a diagnosis of primary colorectal adenocarcinoma. ADDITIONAL INFORMATION: Mismatch Repair (MMR) Protein Immunohistochemistry (IHC) IHC Expression Result: MLH1: Preserved nuclear expression (greater 50% tumor expression) MSH2: Preserved nuclear expression (greater 50% tumor expression) MSH6: Preserved nuclear expression (greater 50% tumor expression) PMS2: Preserved nuclear expression (greater 50% tumor expression) * Internal control demonstrates intact nuclear expression Interpretation: NORMAL There is preserved expression of the major and minor MMR proteins. There is a very low probability that microsatellite instability (MSI) is present. However, certain clinically significant MMR protein mutations may result in preservation of nuclear expression. It is recommended that the preservation of protein expression be correlated with molecular  based MSI testing.    RADIOGRAPHIC STUDIES: I have personally reviewed the radiological images as listed and agreed with the findings in the report.  CT CAP w Contrast 05/19/2016 IMPRESSION: 1. Numerous small solid pulmonary nodules, one of which is new, and several of which have mildly increased in size since 10/28/2015, worrisome for mild progression of pulmonary metastases. 2. Solitary right liver lobe metastasis is mildly increased in size since 01/08/2016. 3. Peritoneal metastases have mildly increased in size since 01/08/2016, largest in the right pelvic sidewall. 4. Well-positioned right nephroureteral stent without overt right hydronephrosis. 5. Additional findings include aortic atherosclerosis, 1 vessel coronary atherosclerosis and mild to moderate emphysema.  Ct chest, Abdomen Pelvis Wo Contrast 01/08/2016  IMPRESSION: 1. 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 2. Hepatic and renal cysts. 3.  Aortoiliac atherosclerotic vascular disease. 4. Lumbar spondylosis and degenerative disc disease with multilevel impingement.  ASSESSMENT & PLAN: 76 y.o. African-American female, with past medical history of moderate dementia, independent with ADLs, hypertension, presents with small bowel obstruction and weight loss.  1. 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 -I personally reviewed her recent PET scan images, unfortunately the scan showed hypermetabolic peritoneal and liver metastases. Given her elevated CEA, normal CA125, this is most consistent with recurrent colon cancer -We previously discussed is that her prostatic  colon cancer is not curable at this stage, and the treatment goal is palliative. -She has started Xeloda 155m bid, 2 weeks on and one week off. 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 The tumor has MSI stable, she is not a candidate for immunotherapy, although she may be a candidate [facility] clinical [facility] -she tolerated Xeloda and Avastin well -I previously reviewed her restaging CT scan from 01/08/2016, which showed overall stable disease, no new lesions. We'll continue her chemotherapy. -I reviewed the CT from 05/19/2016 with the patient and her daughter  in detail. She has had a moderate disease progression in lungs, liver and peritoneum. I recommend her to change treatment. -she is now on second line single agent rinotecan at a reduced dose 143m/m2, tolerated well, will continue  -We'll continue Avastin -If she develops poor tolerance to chemotherapy, or further disease progression on next skin, I will likely stop chemotherapy and change to palliative care alone, due to her advanced age and comorbidities, especially dementia. -Lab reviewed, adequate for treatment, 2nd cycle Irinotecan and Avastin today.  2. CKD stage III, right hydroureter -Her Cr was around 1 in 08/2013, Cr increased to 2.4 on 01/20/2015 when she was diagnosed with colon cancer due to dehydration, improved some and has been around 1.3-1.5 lately.  -A previous CT scan showed right hydroureter, which probably contributes to her renal insufficiency. -she had ureter stent placement again in 12/2015, 03/2016 and 04/2016  3. HTN, dementia -She will continue her medication and follow-up with her primary care physician. -Her BP has been better controlled lately, we discussed that Avastin can cause hypertension, we'll continue monitoring closely.  4.  Hyperbilirubinemia -probably related to liver metastasis, vs Xeloda  -Her total bilirubin has increased to 2.6 on 05/20/16, we held her Xeloda. -We discussed the signs of jaundice, her daughter will watch her her at home and call u[place]oreaif she noticed jaundice. -Improved.  5. Nausea and diarrhea -Secondary to chemotherapy, overall very mild  -She is to take Imodium PRN and her nausea medications.  6. Mucositis -Secondary to chemotherapy -I advised the patient to swish her mouth with a salt and baking soda mix. -The patient could also used Biotene to rinse her mouth.  7. Goal of care discussion  -We previously discussed the incurable nature of her cancer, and the overall poor prognosis, especially if she has progress on chemo -The patient understands the goal of care is palliative. -Due to her advanced age and comorbidities, especially dementia, I have low threshold to switch her to palliative care alone if she does not tolerate chemo well  -I recommend DNR/DNI, she agrees.   Plan -F/u in 2 and 6 weeks. -Lab, flush, and irinotecan and avastin in 2, 4, and 6 weeks. OK to postpone for one week if no infusion slot in 2 weeks.  All questions were answered. The patient knows to call the clinic with any problems, questions or concerns.  I spent 25 minutes counseling the patient face to face. The total time spent in the appointment was 30 minutes and more than 50% was on counseling.   FTruitt Merle MD 06/17/2016    This document serves as a record of services personally performed by YTruitt Merle MD. It was created on her behalf by JDarcus Austin a trained medical scribe. The creation of this record is based on the scribe's personal observations and the provider's statements to them. This document has been checked and approved by the attending provider.

## 2016-06-17 NOTE — Telephone Encounter (Signed)
Gave patient AVS and calender per 4/13 los.  

## 2016-06-17 NOTE — Progress Notes (Signed)
Per MD ok to treat with urine protein of 100

## 2016-06-21 ENCOUNTER — Telehealth: Payer: Self-pay | Admitting: Medical Oncology

## 2016-06-21 MED FILL — SIMVASTATIN 40 MG TABLET: 40 | 30 days supply | Qty: 30 | Fill #0

## 2016-06-21 NOTE — Telephone Encounter (Signed)
Diarrhea -day 7 post Irinotecan and avastin .  Reports watery stools started this am of a large amount " equivalent  to a normal BM" . She gave pt imodium 2 tablets ,then she had another stool. I instructed her to give pt another imodium after each subsequent stool up to 8/day. She is keeping her mother hydrated, no fever , no nausea. Mouth sores - Just one she can see and she is doing salt water rinses. I told her to call back if symptoms worsen.

## 2016-06-21 NOTE — Telephone Encounter (Signed)
I agree with you, Diane, thanks.   [name] MD

## 2016-06-28 MED FILL — MIRTAZAPINE 15 MG TABLET: 15 | 30 days supply | Qty: 30 | Fill #1

## 2016-06-30 NOTE — Progress Notes (Signed)
[place]  [phone] [phone]  Clinic Follow Up Note   Patient Care Team: [name], MD as PCP - General (Family Medicine) [name], MD as Consulting Physician (Medical Oncology) [name], MD as Consulting Physician (General Surgery) [name], MD as Consulting Physician (Orthopedic Surgery) Cleon Gustin, MD as Consulting Physician (Urology) 07/01/2016     CHIEF COMPLAINTS:  Follow up metastatic colon cancer  Oncology History   Cancer of right colon [facility])   Staging form: Colon and Rectum, AJCC 7th Edition     Pathologic stage from 01/23/2015: Stage IIC (T4b, N0, cM0) - Signed by [name], MD on 02/05/2015       Cancer of right colon ([place])   01/21/2015 Imaging     CT abdomen and pelvis showed high-grade small bowel obstruction,  right I [place] ureteral nephrosis,  multiple liver cysts.  CT chest was negative for metastatic lesion.      01/23/2015 Pathology Results     invasive adenocarcinoma extending into the pericecal connective tissue,  grade 2, LVI (-),  no perineural invasion, 3 lymph nodes were negative.      01/23/2015 Surgery      terminal ileum and cecum seegmental resection with anastomosis by Dr. Hassell Done      01/23/2015 Initial Diagnosis    Cancer of right colon ([place])      03/13/2015 - 07/06/2015 Chemotherapy    Xeloda 1500 mg bid X 14 days on-7 off, s/p 5 cycles, stopped early due to wrosening renal function      10/28/2015 Progression    PET scan showed multiple hypermetabolic peritoneal nodules, right ovarian cystic mass, and hypermetabolic liver lesion, highly suspicious for metastatic colon cancer. Small pulmonary nodules are indeterminate.       11/11/2015 - 05/20/2016 Chemotherapy    Xeloda '1500mg'$  bid, 2 weeks on and one week off, Avastin was added on from cycle 3. Xeloda held from 05/20/16 due to progression on CT from 05/19/16 and total bilirubin increased to 2.6.      01/08/2016 Imaging    Ct  chest, Abdomen Pelvis Wo Contrast 01/08/2016  IMPRESSION: 1. 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 2. Hepatic and renal cysts. 3.  Aortoiliac atherosclerotic vascular disease. 4. Lumbar spondylosis and degenerative disc disease with multilevel Impingement.      05/19/2016 Imaging    CT CAP w Contrast IMPRESSION: 1. Numerous small solid pulmonary nodules, one of which is new, and several of which have mildly increased in size since 10/28/2015, worrisome for mild progression of pulmonary metastases. 2. Solitary right liver lobe metastasis is mildly increased in size since 01/08/2016. 3. Peritoneal metastases have mildly increased in size since 01/08/2016, largest in the right pelvic sidewall. 4. Well-positioned right nephroureteral stent without overt right hydronephrosis. 5. Additional findings include aortic atherosclerosis, 1 vessel coronary atherosclerosis and mild to moderate emphysema.      06/03/2016 -  Chemotherapy    Irinotecan and Avastin every 2 weeks starting 06/03/16       HISTORY OF PRESENTING ILLNESS (02/05/2015):  Benetta Spar 76 y.o. female  with past medical history of moderate dementia, hypertension, is here because of recently diagnosed stage II colon cancer. She is accompanied to the clinic by her daughter. History is mainly obtained from the chart and her daughter.  She has had abdominal discomfort, nausea and vomiting, and norexia for  the past to 3 months along with 15 pounds weight loss, .  Her nausea was mild and intermittent, she does not seek medical attention initially. She presented with  worsening symptoms for 2-3 weeks, and episodes of projectile vomiting and dehydration to emergency room, was admitted to [facility] on 01/20/2015. CT scan reviewed small bowel obstruction, and possible appendiceal rupture. She was brought to OR on 01/23/2015, underwent right hemicolectomy with anastomosis  by Dr. Hassell Done. Surgical path reviewed adenocarcinoma at the cecum. She was discharged home on 01/26/2015.  She has recovered well, eating much better than prior to surgery, move around without restriction. No pain, she has loose BM 3 times a day, and she has backed off her mirealax.  she lives with her daughter. She states her appetite is moderate, sometime low, and she refuses to eat if she doesn't have appetite. No nausea, vomiting, abdominal bloating since her surgery.  Her last colonoscopy was 6-8 years ago, and her stool OB was negative this year.   CURRENT THERAPY: Irinotecan and Avastin every 2 weeks starting 06/03/16  INTERIM HISTORY: Desi presents to the clinic today with her daughter for follow up and cycle 3 Irinotecan and Avastin. Her daughter reports [place] has diarrhea x4-6. Occurred 2-3 days after infusion. Imodium helped. She reports that she feels good. She recently went to dentist and they modified denture above mouth ulcer. The patient's daughter also stated that her mother has a sore mouth. Deandrea reports the pain is no longer there. Her daughter reports some weight loss and denies fever. The right uretal stent that will be replaced on 07/18/16 by Dr. [name].  Daughter reports [place]'s right eye may experience inconsistent depth perception but Damisha denies blurry vision. Daughter says she tries to test Deaisha's vision. Her October eye check-up was reported by daughter to be good. Daughter reports Giannah waking up at 2-4am and not getting much rest. She is on mirtazapine for sleep. The patient is not a good historian due to dementia.   MEDICAL HISTORY:  Past Medical History:  Diagnosis Date  . Anxiety   . Arthritis   . Bilateral renal cysts   . Centrilobular emphysema ([place])   . Chronic low back pain   . CKD (chronic kidney disease), stage III   . Colon cancer [facility]) oncologist-  dr [name]   dx 01-23-2015  right colon Invasive adenocarcinoma into pericecal connective tissue and  involving small intestine , Stage IIc, Grade 2, LVI(-),  (pT4b N0 M0) /  s/p resection termial ileum and cecum and chemotherapy 01-06-217 to 07-06-2015/   08/ 2017  per PET  recurrent w/ liver mets and peritoneal carcinomatosis  . Complication of anesthesia    can be combative due to dementia  . Full dentures   . History of gastroesophageal reflux (GERD)    changes diet  . History of pelvic fracture   . Hyperlipidemia   . Hypertension   . Liver metastasis ([place])   . 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 Pulmonary nodules   . Right ovarian cyst   . Ureteral obstruction, right urologist-  dr [name]   malignant obstruction right ureter w/ colon cancer -- treated with ureteral stent placement    SURGICAL HISTORY: Past Surgical History:  Procedure Laterality Date  . CATARACT EXTRACTION W/ INTRAOCULAR LENS  IMPLANT, BILATERAL  2005 approx  . CYSTOSCOPY W/ URETERAL STENT PLACEMENT Right 12/03/2015   Procedure: CYSTOSCOPY WITH RETROGRADE PYELOGRAM/URETERAL STENT PLACEMENT;  Surgeon: Cleon Gustin, MD;  Location: [place];  Service: Urology;  Laterality: Right;  . CYSTOSCOPY W/ URETERAL STENT PLACEMENT Right 02/15/2016   Procedure: CYSTOSCOPY WITH RETROGRADE PYELOGRAM/URETERAL STENT REPLACEMENT;  Surgeon: Cleon Gustin, MD;  Location[facility];  Service: Urology;  Laterality: Right;  . CYSTOSCOPY W/ URETERAL STENT PLACEMENT Right 03/28/2016   Procedure: CYSTOSCOPY WITH RETROGRADE PYELOGRAM/URETERAL STENT EXCHANGE;  Surgeon: Cleon Gustin, MD;  Location[facility];  Service: Urology;  Laterality: Right;  . CYSTOSCOPY WITH RETROGRADE PYELOGRAM, URETEROSCOPY AND STENT PLACEMENT Right 09/21/2015   Procedure: CYSTOSCOPY WITH BIL RETROGRADE PYELOGRAM, URETEROSCOPY, RIGHT STENT PLACEMENT ;  Surgeon: Cleon Gustin, MD;  Location: [facility];  Service: Urology;  Laterality: Right;  . LAPAROSCOPY N/A 01/23/2015   Procedure: DIAGNOSTIC LAPAROSCOPY, EXPLORATORY LAPAROTOMY  RIGHT HEMI-COLECTOMY FOR OBSTRUCTION OF RIGHT TERMINAL ILEUM;  Surgeon: [name], MD;  Location: [facility];  Service: General;  Laterality: N/A;    SOCIAL HISTORY: Social History   Social History  . Marital status: Single    Spouse name: N/A  . Number of children: N/A  . Years of education: N/A   Occupational History  . Not on file.   Social History Main Topics  . Smoking status: Former Smoker    Packs/day: 0.25    Years: 50.00    Types: Cigarettes    Quit date: 01/25/2015  . Smokeless tobacco: Never Used  . Alcohol use 1.8 oz/week    2 Glasses of wine, 1 Cans of beer per week     Comment: occasional   . Drug use: No  . Sexual activity: Not on file   Other Topics Concern  . Not on file   Social History Narrative   Legally separated   Lives w/daughter, Lucille Witts   Has total of #2 daughters and #1 son   Dementia   Fall Risk-ambulates w/cane    FAMILY HISTORY: Family History  Problem Relation Age of Onset  . Diabetes Mellitus II Father   . Cancer Mother 35    ovarian cancer   . Cervical cancer Other     ALLERGIES:  is allergic to namenda [memantine hcl] and oxycodone.  MEDICATIONS:  Current Outpatient Prescriptions  Medication Sig Dispense Refill  . amLODipine (NORVASC) 5 MG tablet Take 5 mg by mouth every morning.     . Cholecalciferol (VITAMIN D3) 2000 UNITS TABS Take by mouth daily.    . Coenzyme Q10 (CO Q 10) 100 MG CAPS Take by mouth daily.    Marland Kitchen donepezil (ARICEPT) 10 MG tablet TAKE 1 TABLET BY MOUTH EVERY DAY 30 tablet 11  . mirabegron ER (MYRBETRIQ) 50 MG TB24 tablet Take 50 mg by mouth daily.    . mirtazapine (REMERON) 15 MG tablet Take 1 tablet (15 mg total) by mouth at bedtime. 30 tablet 3  . Multiple Vitamins-Minerals (OCUVITE-LUTEIN PO) Take 1 tablet by mouth daily.    . Multiple Vitamins-Minerals  (WOMENS MULTIVITAMIN PO) Take 1 tablet by mouth daily.    . polyethylene glycol (MIRALAX / GLYCOLAX) packet Take 17 g by mouth daily. (Patient taking differently: Take 17 g by mouth daily as needed. ) 14 each 0  . prochlorperazine (COMPAZINE) 5 MG tablet Take 1 tablet (5 mg total) by mouth every 6 (six) hours as needed for nausea or vomiting. 30 tablet 0  . simvastatin (ZOCOR) 20 MG tablet Take 20 mg by mouth daily at 6 PM.    . traMADol (ULTRAM) 50 MG tablet  Take 1 tablet (50 mg total) by mouth every 6 (six) hours as needed. 30 tablet 0  . UNABLE TO FIND Med Name: Closys Mouth Rinse two-three times daily for ulcer.    . vitamin B-12 (CYANOCOBALAMIN) 1000 MCG tablet Take 1,000 mcg by mouth daily.    Marland Kitchen antiseptic oral rinse (BIOTENE) LIQD 15 mLs by Mouth Rinse route daily. BIOTENE CLINICAL     No current facility-administered medications for this visit.     REVIEW OF SYSTEMS:  Constitutional: Denies fevers, chills or abnormal night sweats (+) fatigue (+) small weight loss (+) less sleep and irregular sleep cycle Eyes: Denies blurriness of vision, double vision or watery eyes (+) possible depth perception issues in right eye Ears, nose, mouth, throat, and face: Denies mucositis or sore throat (+) Sore mouth Respiratory: Denies cough, dyspnea or wheezes Cardiovascular: Denies palpitation, chest discomfort or lower extremity swelling Gastrointestinal:  Denies heartburn (+) diarrhea (+) Nausea Skin: Denies abnormal skin rashes Lymphatics: Denies new lymphadenopathy or easy bruising Neurological:Denies numbness, tingling or new weaknesses Behavioral/Psych: Mood is stable, no new changes (+) Dementia All other systems were reviewed with the patient and are negative.  PHYSICAL EXAMINATION: ECOG PERFORMANCE STATUS: 1 - Symptomatic but completely ambulatory  Vitals:   07/01/16 1136  BP: 130/67  Pulse: 93  Resp: 18  Temp: 98.2 F (36.8 C)   Filed Weights   07/01/16 1136  Weight: 122 lb  (55.3 kg)   GENERAL:alert, no distress and comfortable SKIN: skin color, texture, turgor are normal, no rashes or significant lesions EYES: normal, conjunctiva are pink and non-injected, sclera clear (+) difficult to evaluate due to her dementia OROPHARYNX:no exudate and tongue normal (+) Upper and lower dentures (+) mild erythema of the mucous of the left side of her mouth NECK: supple, thyroid normal size, non-tender, without nodularity LYMPH:  no palpable lymphadenopathy in the cervical, axillary or inguinal LUNGS: clear to auscultation and percussion with normal breathing effort HEART: regular rate & rhythm and no murmurs and no lower extremity edema ABDOMEN:abdomen soft, non-tender and normal bowel sounds Musculoskeletal:no cyanosis of digits and no clubbing  PSYCH: alert & oriented x 3 with fluent speech (+) dementia worsening NEURO: no focal motor/sensory deficits  LABORATORY DATA:  I have reviewed the data as listed CBC Latest Ref Rng & Units 07/01/2016 06/17/2016 06/03/2016  WBC 3.9 - 10.3 10e3/uL 8.5 5.8 13.5(H)  Hemoglobin 11.6 - 15.9 g/dL 11.2(L) 12.2 12.5  Hematocrit 34.8 - 46.6 % 34.1(L) 36.4 37.2  Platelets 145 - 400 10e3/uL 396 447(H) 418(H)    CMP Latest Ref Rng & Units 07/01/2016 06/17/2016 06/03/2016  Glucose 70 - 140 mg/dl 74 69(L) 94  BUN 7.0 - 26.0 mg/dL 11.6 16.8 15.9  Creatinine 0.6 - 1.1 mg/dL 1.4(H) 1.3(H) 1.5(H)  Sodium 136 - 145 mEq/L 143 140 140  Potassium 3.5 - 5.1 mEq/L 4.0 4.3 3.8  Chloride 101 - 111 mmol/L - - -  CO2 22 - 29 mEq/L _0 Calcium 8.4 - 10.4 mg/dL 9.5 9.5 9.8  Total Protein 6.4 - 8.3 g/dL 7.5 7.9 8.2  Total Bilirubin 0.20 - 1.20 mg/dL 0.31 0.36 0.70  Alkaline Phos 40 - 150 U/L 82 81 91  AST 5 - 34 U/L _1 ALT 0 - 55 U/L _2 CEA  01/23/2015: 5.7 06/05/2015: 6.1 08/04/2015: 10.3 10/12/2015: 13.29 (in-house)  11/04/2015: 12.83 01/13/2016: 8.57 02/24/2016: 8.41 04/27/2016: 9.22 05/20/2016: 8.32 06/17/2016: 6.45  PATHOLOGY  REPORT: Diagnosis 01/23/2015  Colon, segmental resection, with terminal ileum and cecum - INVASIVE ADENOCARCINOMA EXTENDING INTO PERICECAL CONNECTIVE TISSUE AND INVOLVING SMALL INTESTINE. - THREE BENIGN LYMPH NODES (0/3). - SEE ONCOLOGY TABLE BELOW.  Microscopic Comment COLON AND RECTUM (INCLUDING TRANS-ANAL RESECTION): Specimen: Terminal ileum and cecum. Procedure: Resection. Tumor site: Cecum adjacent to appendix. Specimen integrity: Intact. Macroscopic intactness of mesorectum: Not applicable. Macroscopic tumor perforation: No. Invasive tumor: Maximum size: 4.0 cm. Histologic type(s): Colorectal adenocarcinoma. Histologic grade and differentiation: G2: moderately differentiated/low grade. Type of polyp in which invasive carcinoma arose: No residual polyp. Microscopic extension of invasive tumor: Into pericecal connective tissue and terminal ileum. Lymph-Vascular invasion: Not identified. Peri-neural invasion: Not identified. Tumor deposit(s) (discontinuous extramural extension): No. Resection margins: Proximal margin: Free of tumor. Distal margin: Free of tumor. Circumferential (radial) (posterior ascending, posterior descending; lateral and posterior mid-rectum; and entire lower 1/3 rectum): Free of tumor. Mesenteric margin (sigmoid and transverse): N/A. Distance closest margin (if all above margins negative): N/A. Treatment effect (neo-adjuvant therapy): No. Additional polyp(s): No. Non-neoplastic findings: N/A. Lymph nodes: number examined 3; number positive: 0. Pathologic Staging: pT4b, pN0, pMX. Ancillary studies: Mismatch repair protein by immunohistochemistry pending. Comments: There is a colorectal-type adenocarcinoma arising in the cecum in the area of the appendiceal orifice. The tumor extends into the pericecal connective tissue, and involves the attached segment of terminal ileum. Immunohistochemistry shows the tumor is positive with CDX2 and cytokeratin 20, and  negative with cytokeratin 7, estrogen receptor and WT-1 supporting a diagnosis of primary colorectal adenocarcinoma. ADDITIONAL INFORMATION: Mismatch Repair (MMR) Protein Immunohistochemistry (IHC) IHC Expression Result: MLH1: Preserved nuclear expression (greater 50% tumor expression) MSH2: Preserved nuclear expression (greater 50% tumor expression) MSH6: Preserved nuclear expression (greater 50% tumor expression) PMS2: Preserved nuclear expression (greater 50% tumor expression) * Internal control demonstrates intact nuclear expression Interpretation: NORMAL There is preserved expression of the major and minor MMR proteins. There is a very low probability that microsatellite instability (MSI) is present. However, certain clinically significant MMR protein mutations may result in preservation of nuclear expression. It is recommended that the preservation of protein expression be correlated with molecular based MSI testing.    RADIOGRAPHIC STUDIES: I have personally reviewed the radiological images as listed and agreed with the findings in the report.  CT CAP w Contrast 05/19/2016 IMPRESSION: 1. Numerous small solid pulmonary nodules, one of which is new, and several of which have mildly increased in size since 10/28/2015, worrisome for mild progression of pulmonary metastases. 2. Solitary right liver lobe metastasis is mildly increased in size since 01/08/2016. 3. Peritoneal metastases have mildly increased in size since 01/08/2016, largest in the right pelvic sidewall. 4. Well-positioned right nephroureteral stent without overt right hydronephrosis. 5. Additional findings include aortic atherosclerosis, 1 vessel coronary atherosclerosis and mild to moderate emphysema.  Ct chest, Abdomen Pelvis Wo Contrast 01/08/2016  IMPRESSION: 1. 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 2. Hepatic and renal cysts. 3.  Aortoiliac  atherosclerotic vascular disease. 4. Lumbar spondylosis and degenerative disc disease with multilevel impingement.  ASSESSMENT & PLAN: 76 y.o. African-American female, with past medical history of moderate dementia, independent with ADLs, hypertension, presents with small bowel obstruction and weight loss.  1. 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 -I personally reviewed her recent PET scan images, unfortunately the scan showed hypermetabolic peritoneal and liver metastases. 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 -She started Xeloda 1559m bid, 2 weeks on and one week off. 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 The tumor has MSI stable, she is not a candidate for immunotherapy, although she may be a [facility] -she tolerated Xeloda and Avastin well -I previously reviewed her restaging CT scan from 01/08/2016, which showed overall stable disease, no new lesions. We'll continue her chemotherapy. -I previously reviewed the CT from 05/19/2016 with the patient and her daughter in detail. She has had a moderate disease progression in lungs, liver and peritoneum. I recommended her to change treatment. -she is now on second line single agent rinotecan at a reduced dose 1588mm2, tolerated well, will continue. -We'll continue Avastin -The goal of  therapy is palliative, mainly to improve her quality of life. -she is actually doing well on chemo, no significant side effects from chemotherapy or cancer-related symptoms. Will continue chemo for now  -If she develops poor tolerance to chemotherapy, or further disease progression on next scan, I will likely stop chemotherapy and change to palliative care alone, due to her advanced age and comorbidities, especially dementia. -Lab reviewed, adequate for treatment, 3rd cycle Irinotecan and Avastin today.  2. CKD stage III, right hydroureter -Her Cr was around 1 in 08/2013, Cr increased to 2.4 on 01/20/2015 when she was diagnosed with colon cancer due to dehydration, improved some and has been around 1.3-1.5 lately.  -A previous CT scan showed right hydroureter, which probably contributes to her renal insufficiency. -she had ureter stent placement again in 12/2015, 03/2016, and 04/2016 -She is scheduled to have a stent replacement on 07/18/16.  3. HTN, dementia -She will continue her medication and follow-up with her primary care physician. -Her BP has been better controlled lately, we discussed that Avastin can cause hypertension, we'll continue monitoring closely. -Her dementia has been getting worse gradually, which has increased her care buren on her daughter. I discussed burn-out prevention with her daughter, she appreciated and has planned to have a care-giver for her mother   4.35Hyperbilirubinemia -probably related to liver metastasis, vs Xeloda  -Her total bilirubin has increased to 2.6 on 05/20/16, we held her Xeloda. -We discussed the signs of jaundice, her daughter will watch her her at home and call usKoreaf she noticed jaundice. -Improved.  5. Nausea and diarrhea -Secondary to chemotherapy, overall very mild  -She is to take Imodium PRN and her nausea medications.  6. Mucositis -Secondary to chemotherapy -I advised the patient to swish her mouth with a salt and baking soda mix. -The  patient could also used Biotene to rinse her mouth.  7. Goal of care discussion  -We previously discussed the incurable nature of her cancer, and the overall poor prognosis, especially if she has progress on chemo -The patient understands the goal of care is palliative. -Due to her advanced age and comorbidities, especially dementia, I have low threshold to switch her to palliative care alone if she does not tolerate chemo well or dementia gets worse, her daughter  is on -board  -I recommend DNR/DNI, she agrees.    Plan -Lab reviewed, adequate for treatment, we'll proceed to cycle 3 Irinotecan and Avastin today and continue every 2 weeks -F/u in 4 weeks. -I spoke with Dr. [name] today who does not think she needs to hold chemo or Avstin for her ureter stent exchange on 5/14  All questions were answered. The patient knows to call the clinic with any problems, questions or concerns.  I spent 25 minutes counseling the patient face to face. The total time spent in the appointment was 30 minutes and more than 50% was on counseling.   [name], MD 07/01/2016    This document serves as a record of services personally performed by [name], MD. It was created on her behalf by Darcus Austin, a trained medical scribe. The creation of this record is based on the scribe's personal observations and the provider's statements to them. This document has been checked and approved by the attending provider.

## 2016-07-01 ENCOUNTER — Encounter: Payer: Self-pay | Admitting: Hematology

## 2016-07-01 ENCOUNTER — Other Ambulatory Visit: Payer: Medicare Other

## 2016-07-01 ENCOUNTER — Ambulatory Visit (HOSPITAL_BASED_OUTPATIENT_CLINIC_OR_DEPARTMENT_OTHER): Payer: Medicare Other

## 2016-07-01 ENCOUNTER — Ambulatory Visit (HOSPITAL_BASED_OUTPATIENT_CLINIC_OR_DEPARTMENT_OTHER): Payer: Medicare Other | Admitting: Hematology

## 2016-07-01 VITALS — BP 130/67 | HR 93 | Temp 98.2°F | Resp 18 | Ht 66.0 in | Wt 122.0 lb

## 2016-07-01 DIAGNOSIS — I10 Essential (primary) hypertension: Secondary | ICD-10-CM

## 2016-07-01 DIAGNOSIS — C182 Malignant neoplasm of ascending colon: Secondary | ICD-10-CM

## 2016-07-01 DIAGNOSIS — F0390 Unspecified dementia without behavioral disturbance: Secondary | ICD-10-CM

## 2016-07-01 DIAGNOSIS — Z5112 Encounter for antineoplastic immunotherapy: Secondary | ICD-10-CM | POA: Diagnosis not present

## 2016-07-01 DIAGNOSIS — D638 Anemia in other chronic diseases classified elsewhere: Secondary | ICD-10-CM | POA: Diagnosis not present

## 2016-07-01 DIAGNOSIS — C180 Malignant neoplasm of cecum: Secondary | ICD-10-CM

## 2016-07-01 DIAGNOSIS — Z5111 Encounter for antineoplastic chemotherapy: Secondary | ICD-10-CM | POA: Diagnosis not present

## 2016-07-01 LAB — COMPREHENSIVE METABOLIC PANEL
ALK PHOS: 82 U/L (ref 40–150)
ALT: 8 U/L (ref 0–55)
AST: 14 U/L (ref 5–34)
Albumin: 3 g/dL — ABNORMAL LOW (ref 3.5–5.0)
Anion Gap: 11 mEq/L (ref 3–11)
BUN: 11.6 mg/dL (ref 7.0–26.0)
CALCIUM: 9.5 mg/dL (ref 8.4–10.4)
CHLORIDE: 107 meq/L (ref 98–109)
CO2: 24 meq/L (ref 22–29)
Creatinine: 1.4 mg/dL — ABNORMAL HIGH (ref 0.6–1.1)
EGFR: 43 mL/min/{1.73_m2} — ABNORMAL LOW (ref >90)
GLUCOSE: 74 mg/dL (ref 70–140)
Potassium: 4 mEq/L (ref 3.5–5.1)
Sodium: 143 mEq/L (ref 136–145)
TOTAL PROTEIN: 7.5 g/dL (ref 6.4–8.3)
Total Bilirubin: 0.31 mg/dL (ref 0.20–1.20)

## 2016-07-01 LAB — CBC WITH DIFFERENTIAL/PLATELET
BASO%: 1.1 % (ref 0.0–2.0)
Basophils Absolute: 0.1 10*3/uL (ref 0.0–0.1)
EOS ABS: 0.2 10*3/uL (ref 0.0–0.5)
EOS%: 2 % (ref 0.0–7.0)
HEMATOCRIT: 34.1 % — AB (ref 34.8–46.6)
HGB: 11.2 g/dL — ABNORMAL LOW (ref 11.6–15.9)
LYMPH#: 1.6 10*3/uL (ref 0.9–3.3)
LYMPH%: 18.8 % (ref 14.0–49.7)
MCH: 30.4 pg (ref 25.1–34.0)
MCHC: 32.8 g/dL (ref 31.5–36.0)
MCV: 92.4 fL (ref 79.5–101.0)
MONO#: 1.1 10*3/uL — AB (ref 0.1–0.9)
MONO%: 12.5 % (ref 0.0–14.0)
NEUT%: 65.6 % (ref 38.4–76.8)
NEUTROS ABS: 5.6 10*3/uL (ref 1.5–6.5)
NRBC: 0 % (ref 0–0)
Platelets: 396 10*3/uL (ref 145–400)
RBC: 3.69 10*6/uL — ABNORMAL LOW (ref 3.70–5.45)
RDW: 16.3 % — ABNORMAL HIGH (ref 11.2–14.5)
WBC: 8.5 10*3/uL (ref 3.9–10.3)

## 2016-07-01 LAB — TECHNOLOGIST REVIEW

## 2016-07-01 MED ORDER — SODIUM CHLORIDE 0.9 % IV SOLN
Freq: Once | INTRAVENOUS | Status: AC
  Administered 2016-07-01: 14:00:00 via INTRAVENOUS

## 2016-07-01 MED ORDER — DEXAMETHASONE SODIUM PHOSPHATE 10 MG/ML IJ SOLN
INTRAMUSCULAR | Status: AC
  Filled 2016-07-01: qty 1

## 2016-07-01 MED ORDER — ATROPINE SULFATE 1 MG/ML IJ SOLN: 0.5000 mg | Freq: Once | INTRAMUSCULAR | Status: DC | PRN

## 2016-07-01 MED ORDER — PALONOSETRON HCL INJECTION 0.25 MG/5ML
0.2500 mg | Freq: Once | INTRAVENOUS | Status: AC
  Administered 2016-07-01: 0.25 mg via INTRAVENOUS

## 2016-07-01 MED ORDER — PALONOSETRON HCL INJECTION 0.25 MG/5ML
INTRAVENOUS | Status: AC
  Filled 2016-07-01: qty 5

## 2016-07-01 MED ORDER — IRINOTECAN HCL CHEMO INJECTION 100 MG/5ML
150.0000 mg/m2 | Freq: Once | INTRAVENOUS | Status: AC
  Administered 2016-07-01: 240 mg via INTRAVENOUS
  Filled 2016-07-01: qty 10

## 2016-07-01 MED ORDER — SODIUM CHLORIDE 0.9 % IV SOLN
5.0000 mg/kg | Freq: Once | INTRAVENOUS | Status: AC
  Administered 2016-07-01: 300 mg via INTRAVENOUS
  Filled 2016-07-01: qty 12

## 2016-07-01 MED ORDER — DEXAMETHASONE SODIUM PHOSPHATE 10 MG/ML IJ SOLN
10.0000 mg | Freq: Once | INTRAMUSCULAR | Status: AC
  Administered 2016-07-01: 10 mg via INTRAVENOUS

## 2016-07-01 NOTE — Progress Notes (Signed)
Per note on 06/17/16. Dr. Burr Medico okay to tx with UA of 100.

## 2016-07-01 NOTE — Patient Instructions (Addendum)
[place] Discharge Instructions for Patients Receiving Chemotherapy  Today you received the following chemotherapy agents:  bevacizumab (Avastin) and irinotecan (Camptosar).  To help prevent nausea and vomiting after your treatment, we encourage you to take your nausea medication as directed.   If you develop nausea and vomiting that is not controlled by your nausea medication, call the clinic.   BELOW ARE SYMPTOMS THAT SHOULD BE REPORTED IMMEDIATELY:  *FEVER GREATER THAN 100.5 F  *CHILLS WITH OR WITHOUT FEVER  NAUSEA AND VOMITING THAT IS NOT CONTROLLED WITH YOUR NAUSEA MEDICATION  *UNUSUAL SHORTNESS OF BREATH  *UNUSUAL BRUISING OR BLEEDING  TENDERNESS IN MOUTH AND THROAT WITH OR WITHOUT PRESENCE OF ULCERS  *URINARY PROBLEMS  *BOWEL PROBLEMS  UNUSUAL RASH Items with * indicate a potential emergency and should be followed up as soon as possible.  Feel free to call the clinic you have any questions or concerns. The clinic phone number is [phone].  Please show the [facility] at check-in to the Emergency Department and triage nurse.

## 2016-07-06 ENCOUNTER — Encounter (HOSPITAL_BASED_OUTPATIENT_CLINIC_OR_DEPARTMENT_OTHER): Payer: Self-pay | Admitting: *Deleted

## 2016-07-06 ENCOUNTER — Telehealth: Payer: Self-pay | Admitting: Hematology

## 2016-07-06 NOTE — Telephone Encounter (Signed)
No los per 07/01/16 visit.

## 2016-07-06 NOTE — Progress Notes (Signed)
To [facility] at 0930-Spoke with daughter Lorriane Shire (pt's HPOA) Pt  has dementia.Npo after Mn-will take amlodipine with water-lab work ,Ekg with chart.

## 2016-07-15 ENCOUNTER — Other Ambulatory Visit (HOSPITAL_BASED_OUTPATIENT_CLINIC_OR_DEPARTMENT_OTHER): Payer: Medicare Other

## 2016-07-15 ENCOUNTER — Ambulatory Visit (HOSPITAL_BASED_OUTPATIENT_CLINIC_OR_DEPARTMENT_OTHER): Payer: Medicare Other

## 2016-07-15 VITALS — BP 144/85 | HR 90 | Temp 98.5°F | Resp 17

## 2016-07-15 DIAGNOSIS — Z5112 Encounter for antineoplastic immunotherapy: Secondary | ICD-10-CM

## 2016-07-15 DIAGNOSIS — Z5111 Encounter for antineoplastic chemotherapy: Secondary | ICD-10-CM | POA: Diagnosis not present

## 2016-07-15 DIAGNOSIS — C180 Malignant neoplasm of cecum: Secondary | ICD-10-CM | POA: Diagnosis not present

## 2016-07-15 DIAGNOSIS — C182 Malignant neoplasm of ascending colon: Secondary | ICD-10-CM

## 2016-07-15 DIAGNOSIS — Z79899 Other long term (current) drug therapy: Secondary | ICD-10-CM

## 2016-07-15 LAB — COMPREHENSIVE METABOLIC PANEL
ALBUMIN: 3.3 g/dL — AB (ref 3.5–5.0)
ALK PHOS: 80 U/L (ref 40–150)
ALT: 13 U/L (ref 0–55)
AST: 17 U/L (ref 5–34)
Anion Gap: 14 mEq/L — ABNORMAL HIGH (ref 3–11)
BILIRUBIN TOTAL: 0.27 mg/dL (ref 0.20–1.20)
BUN: 13.8 mg/dL (ref 7.0–26.0)
CALCIUM: 9.9 mg/dL (ref 8.4–10.4)
CO2: 22 mEq/L (ref 22–29)
CREATININE: 1.2 mg/dL — AB (ref 0.6–1.1)
Chloride: 107 mEq/L (ref 98–109)
EGFR: 51 mL/min/{1.73_m2} — ABNORMAL LOW (ref >90)
Glucose: 85 mg/dl (ref 70–140)
POTASSIUM: 4.2 meq/L (ref 3.5–5.1)
Sodium: 143 mEq/L (ref 136–145)
TOTAL PROTEIN: 7.9 g/dL (ref 6.4–8.3)

## 2016-07-15 LAB — CBC WITH DIFFERENTIAL/PLATELET
BASO%: 0.3 % (ref 0.0–2.0)
Basophils Absolute: 0 10*3/uL (ref 0.0–0.1)
EOS%: 1.5 % (ref 0.0–7.0)
Eosinophils Absolute: 0.2 10*3/uL (ref 0.0–0.5)
HEMATOCRIT: 32 % — AB (ref 34.8–46.6)
HEMOGLOBIN: 10.5 g/dL — AB (ref 11.6–15.9)
LYMPH#: 1.7 10*3/uL (ref 0.9–3.3)
LYMPH%: 14.9 % (ref 14.0–49.7)
MCH: 30 pg (ref 25.1–34.0)
MCHC: 32.8 g/dL (ref 31.5–36.0)
MCV: 91.4 fL (ref 79.5–101.0)
MONO#: 0.9 10*3/uL (ref 0.1–0.9)
MONO%: 8 % (ref 0.0–14.0)
NEUT#: 8.6 10*3/uL — ABNORMAL HIGH (ref 1.5–6.5)
NEUT%: 75.3 % (ref 38.4–76.8)
NRBC: 0 % (ref 0–0)
Platelets: 623 10*3/uL — ABNORMAL HIGH (ref 145–400)
RBC: 3.5 10*6/uL — ABNORMAL LOW (ref 3.70–5.45)
RDW: 17 % — ABNORMAL HIGH (ref 11.2–14.5)
WBC: 11.4 10*3/uL — ABNORMAL HIGH (ref 3.9–10.3)

## 2016-07-15 LAB — CEA (IN HOUSE-CHCC): CEA (CHCC-IN HOUSE): 5.07 ng/mL — AB (ref 0.00–5.00)

## 2016-07-15 LAB — UA PROTEIN, DIPSTICK - CHCC: PROTEIN: 100 mg/dL

## 2016-07-15 MED ORDER — PALONOSETRON HCL INJECTION 0.25 MG/5ML
INTRAVENOUS | Status: AC
  Filled 2016-07-15: qty 5

## 2016-07-15 MED ORDER — DEXAMETHASONE SODIUM PHOSPHATE 10 MG/ML IJ SOLN
INTRAMUSCULAR | Status: AC
  Filled 2016-07-15: qty 1

## 2016-07-15 MED ORDER — SODIUM CHLORIDE 0.9 % IV SOLN
Freq: Once | INTRAVENOUS | Status: AC
  Administered 2016-07-15: 15:00:00 via INTRAVENOUS

## 2016-07-15 MED ORDER — SODIUM CHLORIDE 0.9 % IV SOLN
5.0000 mg/kg | Freq: Once | INTRAVENOUS | Status: AC
  Administered 2016-07-15: 300 mg via INTRAVENOUS
  Filled 2016-07-15: qty 12

## 2016-07-15 MED ORDER — DEXAMETHASONE SODIUM PHOSPHATE 10 MG/ML IJ SOLN
10.0000 mg | Freq: Once | INTRAMUSCULAR | Status: AC
  Administered 2016-07-15: 10 mg via INTRAVENOUS

## 2016-07-15 MED ORDER — PALONOSETRON HCL INJECTION 0.25 MG/5ML
0.2500 mg | Freq: Once | INTRAVENOUS | Status: AC
  Administered 2016-07-15: 0.25 mg via INTRAVENOUS

## 2016-07-15 MED ORDER — ATROPINE SULFATE 1 MG/ML IJ SOLN: 0.5000 mg | Freq: Once | INTRAMUSCULAR | Status: DC | PRN

## 2016-07-15 MED ORDER — IRINOTECAN HCL CHEMO INJECTION 100 MG/5ML
150.0000 mg/m2 | Freq: Once | INTRAVENOUS | Status: AC
  Administered 2016-07-15: 240 mg via INTRAVENOUS
  Filled 2016-07-15: qty 10

## 2016-07-15 NOTE — Patient Instructions (Signed)
[facility] Discharge Instructions for Patients Receiving Chemotherapy  Today you received the following chemotherapy agents:  Avastin, Irinotecan  To help prevent nausea and vomiting after your treatment, we encourage you to take your nausea medication as prescribed.   If you develop nausea and vomiting that is not controlled by your nausea medication, call the clinic.   BELOW ARE SYMPTOMS THAT SHOULD BE REPORTED IMMEDIATELY:  *FEVER GREATER THAN 100.5 F  *CHILLS WITH OR WITHOUT FEVER  NAUSEA AND VOMITING THAT IS NOT CONTROLLED WITH YOUR NAUSEA MEDICATION  *UNUSUAL SHORTNESS OF BREATH  *UNUSUAL BRUISING OR BLEEDING  TENDERNESS IN MOUTH AND THROAT WITH OR WITHOUT PRESENCE OF ULCERS  *URINARY PROBLEMS  *BOWEL PROBLEMS  UNUSUAL RASH Items with * indicate a potential emergency and should be followed up as soon as possible.  Feel free to call the clinic you have any questions or concerns. The clinic phone number is [phone].  Please show the [place] at check-in to the Emergency Department and triage nurse.

## 2016-07-18 ENCOUNTER — Encounter (HOSPITAL_BASED_OUTPATIENT_CLINIC_OR_DEPARTMENT_OTHER): Payer: Self-pay | Admitting: *Deleted

## 2016-07-18 ENCOUNTER — Ambulatory Visit (HOSPITAL_BASED_OUTPATIENT_CLINIC_OR_DEPARTMENT_OTHER)
Admission: RE | Admit: 2016-07-18 | Discharge: 2016-07-18 | Disposition: A | Discharge status: Home / Self Care | Payer: Medicare Other | Source: Ambulatory Visit | Attending: Urology | Admitting: Urology

## 2016-07-18 ENCOUNTER — Encounter (HOSPITAL_BASED_OUTPATIENT_CLINIC_OR_DEPARTMENT_OTHER)
Admission: RE | Disposition: A | Discharge status: Home / Self Care | Payer: Self-pay | Source: Ambulatory Visit | Attending: Urology

## 2016-07-18 ENCOUNTER — Ambulatory Visit (HOSPITAL_BASED_OUTPATIENT_CLINIC_OR_DEPARTMENT_OTHER): Payer: Medicare Other | Admitting: Anesthesiology

## 2016-07-18 DIAGNOSIS — Z87891 Personal history of nicotine dependence: Secondary | ICD-10-CM | POA: Diagnosis not present

## 2016-07-18 DIAGNOSIS — M545 Low back pain: Secondary | ICD-10-CM | POA: Insufficient documentation

## 2016-07-18 DIAGNOSIS — E785 Hyperlipidemia, unspecified: Secondary | ICD-10-CM | POA: Diagnosis not present

## 2016-07-18 DIAGNOSIS — M1990 Unspecified osteoarthritis, unspecified site: Secondary | ICD-10-CM | POA: Insufficient documentation

## 2016-07-18 DIAGNOSIS — F419 Anxiety disorder, unspecified: Secondary | ICD-10-CM | POA: Insufficient documentation

## 2016-07-18 DIAGNOSIS — F0390 Unspecified dementia without behavioral disturbance: Secondary | ICD-10-CM | POA: Diagnosis not present

## 2016-07-18 DIAGNOSIS — K219 Gastro-esophageal reflux disease without esophagitis: Secondary | ICD-10-CM | POA: Diagnosis not present

## 2016-07-18 DIAGNOSIS — N183 Chronic kidney disease, stage 3 (moderate): Secondary | ICD-10-CM | POA: Diagnosis not present

## 2016-07-18 DIAGNOSIS — I129 Hypertensive chronic kidney disease with stage 1 through stage 4 chronic kidney disease, or unspecified chronic kidney disease: Secondary | ICD-10-CM | POA: Diagnosis not present

## 2016-07-18 DIAGNOSIS — Z85038 Personal history of other malignant neoplasm of large intestine: Secondary | ICD-10-CM | POA: Insufficient documentation

## 2016-07-18 DIAGNOSIS — G8929 Other chronic pain: Secondary | ICD-10-CM | POA: Insufficient documentation

## 2016-07-18 DIAGNOSIS — Z8505 Personal history of malignant neoplasm of liver: Secondary | ICD-10-CM | POA: Insufficient documentation

## 2016-07-18 DIAGNOSIS — N1330 Unspecified hydronephrosis: Secondary | ICD-10-CM | POA: Diagnosis present

## 2016-07-18 DIAGNOSIS — C786 Secondary malignant neoplasm of retroperitoneum and peritoneum: Secondary | ICD-10-CM | POA: Diagnosis not present

## 2016-07-18 DIAGNOSIS — J449 Chronic obstructive pulmonary disease, unspecified: Secondary | ICD-10-CM | POA: Diagnosis not present

## 2016-07-18 DIAGNOSIS — N1339 Other hydronephrosis: Secondary | ICD-10-CM | POA: Diagnosis not present

## 2016-07-18 HISTORY — PX: CYSTOSCOPY W/ URETERAL STENT PLACEMENT: SHX1429

## 2016-07-18 SURGERY — CYSTOSCOPY, WITH RETROGRADE PYELOGRAM AND URETERAL STENT INSERTION
Anesthesia: General | Site: Renal | Laterality: Right

## 2016-07-18 MED ORDER — HYDROCODONE-ACETAMINOPHEN 7.5-325 MG PO TABS
1.0000 | ORAL_TABLET | Freq: Once | ORAL | Status: DC | PRN
  Filled 2016-07-18: qty 1

## 2016-07-18 MED ORDER — FENTANYL CITRATE (PF) 100 MCG/2ML IJ SOLN: INTRAMUSCULAR | Status: DC | PRN

## 2016-07-18 MED ORDER — FENTANYL CITRATE (PF) 100 MCG/2ML IJ SOLN
INTRAMUSCULAR | Status: DC | PRN
  Administered 2016-07-18: 25 ug via INTRAVENOUS

## 2016-07-18 MED ORDER — DEXAMETHASONE SODIUM PHOSPHATE 10 MG/ML IJ SOLN
INTRAMUSCULAR | Status: AC
  Filled 2016-07-18: qty 1

## 2016-07-18 MED ORDER — SODIUM CHLORIDE 0.9 % IV SOLN
INTRAVENOUS | Status: DC
  Administered 2016-07-18: 10:00:00 via INTRAVENOUS
  Filled 2016-07-18: qty 1000

## 2016-07-18 MED ORDER — CEFAZOLIN SODIUM-DEXTROSE 2-4 GM/100ML-% IV SOLN
INTRAVENOUS | Status: AC
  Filled 2016-07-18: qty 100

## 2016-07-18 MED ORDER — LIDOCAINE 2% (20 MG/ML) 5 ML SYRINGE
INTRAMUSCULAR | Status: AC
  Filled 2016-07-18: qty 5

## 2016-07-18 MED ORDER — CEFAZOLIN SODIUM-DEXTROSE 2-4 GM/100ML-% IV SOLN
2.0000 g | INTRAVENOUS | Status: AC
  Administered 2016-07-18: 2 g via INTRAVENOUS
  Filled 2016-07-18: qty 100

## 2016-07-18 MED ORDER — ONDANSETRON HCL 4 MG/2ML IJ SOLN
INTRAMUSCULAR | Status: AC
  Filled 2016-07-18: qty 2

## 2016-07-18 MED ORDER — FENTANYL CITRATE (PF) 100 MCG/2ML IJ SOLN
25.0000 ug | INTRAMUSCULAR | Status: DC | PRN
  Filled 2016-07-18: qty 1

## 2016-07-18 MED ORDER — IOHEXOL 300 MG/ML  SOLN
INTRAMUSCULAR | Status: DC | PRN
  Administered 2016-07-18: 3 mL via URETHRAL

## 2016-07-18 MED ORDER — SODIUM CHLORIDE 0.9 % IR SOLN
Status: DC | PRN
  Administered 2016-07-18: 1000 mL via INTRAVESICAL

## 2016-07-18 MED ORDER — DEXAMETHASONE SODIUM PHOSPHATE 4 MG/ML IJ SOLN
INTRAMUSCULAR | Status: DC | PRN
  Administered 2016-07-18: 4 mg via INTRAVENOUS

## 2016-07-18 MED ORDER — LIDOCAINE 2% (20 MG/ML) 5 ML SYRINGE
INTRAMUSCULAR | Status: DC | PRN
  Administered 2016-07-18: 70 mg via INTRAVENOUS

## 2016-07-18 MED ORDER — FENTANYL CITRATE (PF) 100 MCG/2ML IJ SOLN
INTRAMUSCULAR | Status: AC
  Filled 2016-07-18: qty 2

## 2016-07-18 MED ORDER — PROPOFOL 10 MG/ML IV BOLUS
INTRAVENOUS | Status: DC | PRN
  Administered 2016-07-18: 120 mg via INTRAVENOUS

## 2016-07-18 MED ORDER — PHENYLEPHRINE 40 MCG/ML (10ML) SYRINGE FOR IV PUSH (FOR BLOOD PRESSURE SUPPORT)
PREFILLED_SYRINGE | INTRAVENOUS | Status: DC | PRN
  Administered 2016-07-18: 160 ug via INTRAVENOUS

## 2016-07-18 MED ORDER — PROPOFOL 10 MG/ML IV BOLUS
INTRAVENOUS | Status: AC
  Filled 2016-07-18: qty 20

## 2016-07-18 MED ORDER — ONDANSETRON HCL 4 MG/2ML IJ SOLN
INTRAMUSCULAR | Status: DC | PRN
  Administered 2016-07-18: 4 mg via INTRAVENOUS

## 2016-07-18 MED ORDER — CEFAZOLIN SODIUM-DEXTROSE 1-4 GM/50ML-% IV SOLN
1.0000 g | INTRAVENOUS | Status: DC
  Filled 2016-07-18: qty 50

## 2016-07-18 MED ORDER — PHENYLEPHRINE 40 MCG/ML (10ML) SYRINGE FOR IV PUSH (FOR BLOOD PRESSURE SUPPORT)
PREFILLED_SYRINGE | INTRAVENOUS | Status: AC
  Filled 2016-07-18: qty 10

## 2016-07-18 MED ORDER — URIBEL 118 MG PO CAPS: 1.0000 | ORAL_CAPSULE | Freq: Two times a day (BID) | ORAL | 3 refills | Status: DC | PRN

## 2016-07-18 MED FILL — URO-MP CAPSULE: 118 | 15 days supply | Qty: 30 | Fill #0

## 2016-07-18 SURGICAL SUPPLY — 13 items
BAG DRAIN URO-CYSTO SKYTR STRL (DRAIN) ×3 IMPLANT
CATH INTERMIT  6FR 70CM (CATHETERS) ×3 IMPLANT
CLOTH BEACON ORANGE TIMEOUT ST (SAFETY) ×3 IMPLANT
GLOVE BIO SURGEON STRL SZ8 (GLOVE) ×3 IMPLANT
GOWN STRL REUS W/TWL LRG LVL3 (GOWN DISPOSABLE) ×3 IMPLANT
GOWN STRL REUS W/TWL XL LVL3 (GOWN DISPOSABLE) ×3 IMPLANT
GUIDEWIRE STR DUAL SENSOR (WIRE) ×3 IMPLANT
KIT RM TURNOVER CYSTO AR (KITS) ×3 IMPLANT
MANIFOLD NEPTUNE II (INSTRUMENTS) ×3 IMPLANT
PACK CYSTO (CUSTOM PROCEDURE TRAY) ×3 IMPLANT
STENT POLARIS LOOP 8FR X 24 CM (STENTS) ×3 IMPLANT
TUBE CONNECTING 12'X1/4 (SUCTIONS) ×1
TUBE CONNECTING 12X1/4 (SUCTIONS) ×2 IMPLANT

## 2016-07-18 NOTE — Anesthesia Postprocedure Evaluation (Addendum)
Anesthesia Post Note  Patient: Beth Wilkins  Procedure(s) Performed: Procedure(s) (LRB): CYSTOSCOPY WITH RETROGRADE PYELOGRAM/ STENT EXCHANGE (Right)  Patient location during evaluation: PACU Anesthesia Type: General Level of consciousness: awake and alert Pain management: pain level controlled Vital Signs Assessment: post-procedure vital signs reviewed and stable Respiratory status: spontaneous breathing, nonlabored ventilation, respiratory function stable and patient connected to nasal cannula oxygen Cardiovascular status: blood pressure returned to baseline and stable Postop Assessment: no signs of nausea or vomiting Anesthetic complications: no       Last Vitals:  Vitals:   07/18/16 1300 07/18/16 1330  BP: 123/74 135/73  Pulse: 96 97  Resp: 11 16  Temp:  37.2 C    Last Pain:  Vitals:   07/18/16 1214  TempSrc:   PainSc: Asleep                 [name]

## 2016-07-18 NOTE — Op Note (Signed)
Preoperative diagnosis: right malignant obstruction  Postoperative diagnosis: Same  Procedure: 1 cystoscopy 2. right retrograde pyelography 3.  Intraoperative fluoroscopy, under one hour, with interpretation 4. right 8 x 24 JJ stent exchange  Attending: [name]  Anesthesia: General  Estimated blood loss: None  Drains: Right 8 x 24 JJ polaris ureteral stent without tether  Specimens: none  Antibiotics: ancef  Findings: right mid ureteral narrowing. no hydronephrosis. No masses/lesions in the bladder. Ureteral orifices in normal anatomic location.  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  After discussing treatment options, they decided proceed with stent exchange.  Procedure her in detail: The patient was brought to the operating room and a brief timeout was done to ensure correct patient, correct procedure, correct site.  General anesthesia was administered patient was placed in dorsal lithotomy position.  Their genitalia was then prepped and draped in usual sterile fashion.  A rigid 5 French cystoscope was passed in the urethra and the bladder.  Bladder was inspected free masses or lesions.  the ureteral orifices were in the normal orthotopic locations. Using a grasper the right stent was brought to the urethral meatus. A sensor wire was advanced through the stent and up to the renal pelvis.  a 6 french ureteral catheter was then instilled into the right ureteral orifice.  a gentle retrograde was obtained and findings noted above.    We then placed a 8 x 24 double-j ureteral stent over the original sensor wire.  We then removed the wire and good coil was noted in the the renal pelvis under fluoroscopy and the bladder under direct vision.  the bladder was then drained and this concluded the procedure which was well tolerated by patient.  Complications: None  Condition: Stable,  extubated, transferred to PACU  Plan: The patient will be discharged home. She will followup in 4 weeks with a renal US

## 2016-07-18 NOTE — Anesthesia Preprocedure Evaluation (Addendum)
Anesthesia Evaluation  Patient identified by MRN, date of birth, ID band Patient awake    Reviewed: Allergy & Precautions, H&P , NPO status , Patient's Chart, lab work & pertinent test results  History of Anesthesia Complications Negative for: history of anesthetic complications  Airway Mallampati: II  TM Distance: >3 FB Neck ROM: full    Dental  (+) Upper Dentures, Lower Dentures   Pulmonary COPD, former smoker,    breath sounds clear to auscultation       Cardiovascular hypertension, Pt. on medications (-) angina(-) Past MI and (-) CHF  Rhythm:regular Rate:Normal     Neuro/Psych PSYCHIATRIC DISORDERS Anxiety Moderate dementia   GI/Hepatic GERD  ,Colon CA   Endo/Other    Renal/GU Renal InsufficiencyRenal disease     Musculoskeletal  (+) Arthritis ,   Abdominal   Peds  Hematology  (+) anemia ,   Anesthesia Other Findings   Reproductive/Obstetrics                            Anesthesia Physical Anesthesia Plan  ASA: II  Anesthesia Plan: General   Post-op Pain Management:    Induction:   Airway Management Planned: LMA  Additional Equipment: None  Intra-op Plan:   Post-operative Plan: Extubation in OR  Informed Consent: I have reviewed the patients History and Physical, chart, labs and discussed the procedure including the risks, benefits and alternatives for the proposed anesthesia with the patient or authorized representative who has indicated his/her understanding and acceptance.   Dental advisory given  Plan Discussed with: CRNA and Surgeon  Anesthesia Plan Comments:         Anesthesia Quick Evaluation

## 2016-07-18 NOTE — Discharge Instructions (Signed)
Ureteral Stent Implantation, Care After Refer to this sheet in the next few weeks. These instructions provide you with information about caring for yourself after your procedure. Your health care provider may also give you more specific instructions. Your treatment has been planned according to current medical practices, but problems sometimes occur. Call your health care provider if you have any problems or questions after your procedure. What can I expect after the procedure? After the procedure, it is common to have:  Nausea.  Mild pain when you urinate. You may feel this pain in your lower back or lower abdomen. Pain should stop within a few minutes after you urinate. This may last for up to 1 week.  A small amount of blood in your urine for several days. Follow these instructions at home:   Medicines   Take over-the-counter and prescription medicines only as told by your health care provider.  If you were prescribed an antibiotic medicine, take it as told by your health care provider. Do not stop taking the antibiotic even if you start to feel better.  Do not drive for 24 hours if you received a sedative.  Do not drive or operate heavy machinery while taking prescription pain medicines. Activity   Return to your normal activities as told by your health care provider. Ask your health care provider what activities are safe for you.  Do not lift anything that is heavier than 10 lb (4.5 kg). Follow this limit for 1 week after your procedure, or for as long as told by your health care provider. General instructions   Watch for any blood in your urine. Call your health care provider if the amount of blood in your urine increases.  If you have a catheter:  Follow instructions from your health care provider about taking care of your catheter and collection bag.  Do not take baths, swim, or use a hot tub until your health care provider approves.  Drink enough fluid to keep your urine  clear or pale yellow.  Keep all follow-up visits as told by your health care provider. This is important. Contact a health care provider if:  You have pain that gets worse or does not get better with medicine, especially pain when you urinate.  You have difficulty urinating.  You feel nauseous or you vomit repeatedly during a period of more than 2 days after the procedure. Get help right away if:  Your urine is dark red or has blood clots in it.  You are leaking urine (have incontinence).  The end of the stent comes out of your urethra.  You cannot urinate.  You have sudden, sharp, or severe pain in your abdomen or lower back.  You have a fever. This information is not intended to replace advice given to you by your health care provider. Make sure you discuss any questions you have with your health care provider. Document Released: 10/24/2012 Document Revised: 07/30/2015 Document Reviewed: 09/05/2014 Elsevier Interactive Patient Education  2017 [place] [facility] Care Instructions  Activity: Get plenty of rest for the remainder of the day. A responsible individual must stay with you for 24 hours following the procedure.  For the next 24 hours, DO NOT: -Drive a car -Paediatric nurse -Drink alcoholic beverages -Take any medication unless instructed by your physician -Make any legal decisions or sign important papers.  Meals: Start with liquid foods such as gelatin or soup. Progress to regular foods as tolerated. Avoid greasy, spicy, heavy foods.  If nausea and/or vomiting occur, drink only clear liquids until the nausea and/or vomiting subsides. Call your physician if vomiting continues.  Special Instructions/Symptoms: Your throat may feel dry or sore from the anesthesia or the breathing tube placed in your throat during surgery. If this causes discomfort, gargle with warm salt water. The discomfort should disappear within 24 hours.  If you had a  scopolamine patch placed behind your ear for the management of post- operative nausea and/or vomiting:  1. The medication in the patch is effective for 72 hours, after which it should be removed.  Wrap patch in a tissue and discard in the trash. Wash hands thoroughly with soap and water. 2. You may remove the patch earlier than 72 hours if you experience unpleasant side effects which may include dry mouth, dizziness or visual disturbances. 3. Avoid touching the patch. Wash your hands with soap and water after contact with the patch.

## 2016-07-18 NOTE — H&P (Signed)
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  Past Medical History:  Diagnosis Date  . Anxiety   . Arthritis   . Bilateral renal cysts   . Centrilobular emphysema ([place])   . Chronic low back pain   . CKD (chronic kidney disease), stage III   . 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 Complication of anesthesia    can be combative due to dementia  . Full dentures   . History of gastroesophageal reflux (GERD)    changes diet  . History of pelvic fracture   . Hyperlipidemia   . Hypertension   . Liver metastasis ([place])   . 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 Pulmonary nodules   . Right ovarian cyst   . 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 CATARACT EXTRACTION W/ INTRAOCULAR LENS  IMPLANT, BILATERAL  2005 approx  . CYSTOSCOPY W/ URETERAL STENT PLACEMENT Right 12/03/2015   Procedure: CYSTOSCOPY WITH RETROGRADE PYELOGRAM/URETERAL STENT PLACEMENT;  Surgeon: Cleon Gustin, MD;  Location: [facility];  Service: Urology;  Laterality: Right;  . CYSTOSCOPY W/ URETERAL STENT PLACEMENT Right 02/15/2016   Procedure: CYSTOSCOPY WITH RETROGRADE PYELOGRAM/URETERAL STENT REPLACEMENT;  Surgeon: Cleon Gustin, MD;  Location: [facility];  Service: Urology;   Laterality: Right;  . CYSTOSCOPY W/ URETERAL STENT PLACEMENT Right 03/28/2016   Procedure: CYSTOSCOPY WITH RETROGRADE PYELOGRAM/URETERAL STENT EXCHANGE;  Surgeon: Cleon Gustin, MD;  Location: [facility];  Service: Urology;  Laterality: Right;  . CYSTOSCOPY WITH RETROGRADE PYELOGRAM, URETEROSCOPY AND STENT PLACEMENT Right 09/21/2015   Procedure: CYSTOSCOPY WITH BIL RETROGRADE PYELOGRAM, URETEROSCOPY, RIGHT STENT PLACEMENT ;  Surgeon: Cleon Gustin, MD;  Location: [facility];  Service: Urology;  Laterality: Right;  . LAPAROSCOPY N/A 01/23/2015   Procedure: DIAGNOSTIC LAPAROSCOPY, EXPLORATORY LAPAROTOMY  RIGHT HEMI-COLECTOMY FOR OBSTRUCTION OF RIGHT TERMINAL ILEUM;  Surgeon: [name], MD;  Location: [facility];  Service: General;  Laterality: N/A;    Home Medications:  Prescriptions Prior to Admission  Medication Sig Dispense Refill Last Dose  . amLODipine (NORVASC) 5 MG tablet Take 5 mg by mouth every morning.    07/18/2016 at 0730  . antiseptic oral rinse (BIOTENE) LIQD 15 mLs by Mouth Rinse route daily. [place] [facility]   07/17/2016 at Unknown time  . Cholecalciferol (VITAMIN D3) 2000 UNITS TABS Take by mouth daily.   07/17/2016 at Unknown time  . Coenzyme Q10 (CO Q 10) 100 MG CAPS Take by mouth daily.   07/17/2016 at Unknown time  . mirabegron ER (MYRBETRIQ) 50 MG TB24 tablet Take 50 mg by mouth daily.   07/17/2016 at Unknown time  . mirtazapine (REMERON) 15 MG tablet Take 1 tablet (15 mg total) by mouth at bedtime. 30 tablet 3 07/17/2016 at 2100  . Multiple Vitamins-Minerals (OCUVITE-LUTEIN  PO) Take 1 tablet by mouth daily.   07/17/2016 at Unknown time  . Multiple Vitamins-Minerals (WOMENS MULTIVITAMIN PO) Take 1 tablet by mouth daily.   07/17/2016 at Unknown time  . simvastatin (ZOCOR) 20 MG tablet Take 20 mg by mouth daily at 6 PM. Not taking -on hold to evaluate muscle weekness   Past Month at Unknown time  . UNABLE TO FIND Med Name: Closys Mouth Rinse  two-three times daily for ulcer.   07/18/2016 at 0800  . vitamin B-12 (CYANOCOBALAMIN) 1000 MCG tablet Take 1,000 mcg by mouth daily.   07/17/2016 at Unknown time  . donepezil (ARICEPT) 10 MG tablet TAKE 1 TABLET BY MOUTH EVERY DAY 30 tablet 11 Unknown at Unknown time  . polyethylene glycol (MIRALAX / GLYCOLAX) packet Take 17 g by mouth daily. (Patient taking differently: Take 17 g by mouth daily as needed. ) 14 each 0 More than a month at Unknown time  . prochlorperazine (COMPAZINE) 5 MG tablet Take 1 tablet (5 mg total) by mouth every 6 (six) hours as needed for nausea or vomiting. 30 tablet 0 never  . traMADol (ULTRAM) 50 MG tablet Take 1 tablet (50 mg total) by mouth every 6 (six) hours as needed. 30 tablet 0 More than a month at Unknown time   Allergies:  Allergies  Allergen Reactions  . Namenda [Memantine Hcl] Other (See Comments)    hallucinations  . Oxycodone Other (See Comments)    Hallucinations     Family History  Problem Relation Age of Onset  . Diabetes Mellitus II Father   . Cancer Mother 69       ovarian cancer   . Cervical cancer Other    Social History:  reports that she quit smoking about 17 months ago. Her smoking use included Cigarettes. She has a 12.50 pack-year smoking history. She has never used smokeless tobacco. She reports that she drinks about 1.8 oz of alcohol per week . She reports that she does not use drugs.  Review of Systems  Genitourinary: Positive for dysuria, flank pain, frequency and urgency.  All other systems reviewed and are negative.   Physical Exam:  Vital signs in last 24 hours: Temp:  [99.4 F (37.4 C)] 99.4 F (37.4 C) (05/14 0916) Pulse Rate:  [96] 96 (05/14 0916) Resp:  [18] 18 (05/14 0916) BP: (125)/(60) 125/60 (05/14 0916) SpO2:  [100 %] 100 % (05/14 0916) Weight:  [53.5 kg (118 lb)] 53.5 kg (118 lb) (05/14 1006) Physical Exam  Constitutional: She is oriented to person, place, and time. She appears well-developed and  well-nourished.  HENT:  Head: Normocephalic and atraumatic.  Eyes: EOM are normal. Pupils are equal, round, and reactive to light.  Neck: Normal range of motion. No thyromegaly present.  Cardiovascular: Normal rate and regular rhythm.   Respiratory: Effort normal. No respiratory distress.  GI: Soft. She exhibits no distension.  Musculoskeletal: Normal range of motion. She exhibits no edema.  Neurological: She is alert and oriented to person, place, and time.  Skin: Skin is warm and dry.  Psychiatric: She has a normal mood and affect. Her behavior is normal. Judgment and thought content normal.    Laboratory Data:  No results found for this or any previous visit (from the past 24 hour(s)). No results found for this or any previous visit (from the past 240 hour(s)). Creatinine:  Recent Labs  07/15/16 1237  CREATININE 1.2*   Baseline Creatinine: 1.2  Impression/Assessment:  75yo with peritoneal carcinomatosis and right  malignant hydronephrosis  Plan:  The risks/benefits/alternatives to right stent exchange was explaiend to the patient and daughter and they understand and wishes to proceed with surgery  [name] 07/18/2016, 11:28 AM

## 2016-07-18 NOTE — Transfer of Care (Signed)
  Last Vitals:  Vitals:   07/18/16 0916  BP: 125/60  Pulse: 96  Resp: 18  Temp: 37.4 C    Last Pain:  Vitals:   07/18/16 0916  TempSrc: Oral      Patients Stated Pain Goal: 7 (07/18/16 0925)  Immediate Anesthesia Transfer of Care Note  Patient: Benetta Spar  Procedure(s) Performed: Procedure(s) (LRB): CYSTOSCOPY WITH RETROGRADE PYELOGRAM/ STENT EXCHANGE (Right)  Patient Location: PACU  Anesthesia Type: General  Level of Consciousness: awake, alert  and oriented  Airway & Oxygen Therapy: Patient Spontanous Breathing and Patient connected to nasal cannula oxygen  Post-op Assessment: Report given to PACU RN and Post -op Vital signs reviewed and stable  Post vital signs: Reviewed and stable  Complications: No apparent anesthesia complications

## 2016-07-18 NOTE — Anesthesia Procedure Notes (Signed)
Procedure Name: LMA Insertion Date/Time: 07/18/2016 11:44 AM Performed by: [name] Mouse Pre-anesthesia Checklist: Patient identified, Emergency Drugs available, Suction available and Patient being monitored Patient Re-evaluated:Patient Re-evaluated prior to inductionOxygen Delivery Method: Circle system utilized Preoxygenation: Pre-oxygenation with 100% oxygen Intubation Type: IV induction Ventilation: Mask ventilation without difficulty LMA: LMA inserted LMA Size: 4.0 Number of attempts: 1 Airway Equipment and Method: Bite block Placement Confirmation: positive ETCO2 Tube secured with: Tape Dental Injury: Teeth and Oropharynx as per pre-operative assessment

## 2016-07-19 ENCOUNTER — Encounter (HOSPITAL_BASED_OUTPATIENT_CLINIC_OR_DEPARTMENT_OTHER): Payer: Self-pay | Admitting: Urology

## 2016-07-21 MED FILL — MYRBETRIQ ER 25 MG TABLET: 25 | 30 days supply | Qty: 30 | Fill #0

## 2016-07-26 MED FILL — MIRTAZAPINE 15 MG TABLET: 15 | 30 days supply | Qty: 30 | Fill #2

## 2016-07-28 NOTE — Progress Notes (Signed)
[place]  Telephone:(336) [phone] [phone]) [phone]  Clinic Follow Up Note   Patient Care Team: [name], MD as PCP - General (Family Medicine) [name], MD as Consulting Physician (Medical Oncology) [name], MD as Consulting Physician (General Surgery) [name], MD as Consulting Physician (Orthopedic Surgery) [name] [name], MD as Consulting Physician (Urology) 07/29/2016     CHIEF COMPLAINTS:  Follow up metastatic colon cancer  Oncology History   Cancer of right colon [facility])   Staging form: Colon and Rectum, AJCC 7th Edition     Pathologic stage from 01/23/2015: Stage IIC (T4b, N0, cM0) - Signed by [name], MD on 02/05/2015       Cancer of right colon ([place])   01/21/2015 Imaging     CT abdomen and pelvis showed high-grade small bowel obstruction,  right I [place] ureteral nephrosis,  multiple liver cysts.  CT chest was negative for metastatic lesion.      01/23/2015 Pathology Results     invasive adenocarcinoma extending into the pericecal connective tissue,  grade 2, LVI (-),  no perineural invasion, 3 lymph nodes were negative.      01/23/2015 Surgery      terminal ileum and cecum seegmental resection with anastomosis by Dr. Hassell Done      01/23/2015 Initial Diagnosis    Cancer of right colon ([place])      03/13/2015 - 07/06/2015 Chemotherapy    Xeloda 1500 mg bid X 14 days on-7 off, s/p 5 cycles, stopped early due to wrosening renal function      10/28/2015 Progression    PET scan showed multiple hypermetabolic peritoneal nodules, right ovarian cystic mass, and hypermetabolic liver lesion, highly suspicious for metastatic colon cancer. Small pulmonary nodules are indeterminate.       11/11/2015 - 05/20/2016 Chemotherapy    Xeloda '1500mg'$  bid, 2 weeks on and one week off, Avastin was added on from cycle 3. Xeloda held from 05/20/16 due to progression on CT from 05/19/16 and total bilirubin increased to 2.6.      01/08/2016 Imaging    Ct chest, Abdomen Pelvis Wo Contrast 01/08/2016  IMPRESSION: 1. 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 2. Hepatic and renal cysts. 3.  Aortoiliac atherosclerotic vascular disease. 4. Lumbar spondylosis and degenerative disc disease with multilevel Impingement.      05/19/2016 Imaging    CT CAP w Contrast IMPRESSION: 1. Numerous small solid pulmonary nodules, one of which is new, and several of which have mildly increased in size since 10/28/2015, worrisome for mild progression of pulmonary metastases. 2. Solitary right liver lobe metastasis is mildly increased in size since 01/08/2016. 3. Peritoneal metastases have mildly increased in size since 01/08/2016, largest in the right pelvic sidewall. 4. Well-positioned right nephroureteral stent without overt right hydronephrosis. 5. Additional findings include aortic atherosclerosis, 1 vessel coronary atherosclerosis and mild to moderate emphysema.      06/03/2016 -  Chemotherapy    Irinotecan and Avastin every 2 weeks starting 06/03/16       HISTORY OF PRESENTING ILLNESS (02/05/2015):  Beth Wilkins 76 y.o. female  with past medical history of moderate dementia, hypertension, is here because of recently diagnosed stage II colon cancer. She is accompanied to the clinic by her daughter. History is mainly obtained from the chart and her daughter.  She has had abdominal discomfort, nausea and vomiting, and norexia for  the past to 3 months along with 15 pounds weight loss, . Her  nausea was mild and intermittent, she does not seek medical attention initially. She presented with  worsening symptoms for 2-3 weeks, and episodes of projectile vomiting and dehydration to emergency room, was admitted to [facility] on 01/20/2015. CT scan reviewed small bowel obstruction, and possible appendiceal rupture. She was brought to OR on 01/23/2015, underwent right hemicolectomy with  anastomosis by Dr. Hassell Done. Surgical path reviewed adenocarcinoma at the cecum. She was discharged home on 01/26/2015.  She has recovered well, eating much better than prior to surgery, move around without restriction. No pain, she has loose BM 3 times a day, and she has backed off her mirealax.  she lives with her daughter. She states her appetite is moderate, sometime low, and she refuses to eat if she doesn't have appetite. No nausea, vomiting, abdominal bloating since her surgery.  Her last colonoscopy was 6-8 years ago, and her stool OB was negative this year.   CURRENT THERAPY: Irinotecan and Avastin every 2 weeks starting 06/03/16  INTERIM HISTORY: Beth Wilkins presents to the clinic today for follow up and cycle 5 treatment with her daughter who is providing most of the history. She has been feeling tired today. She ate breakfast this morning along with a boost. She denies nausea. She experiences diarrhea about 3 days after chemo which she treats with Imodium.   She reports to feeling "whoozy."    MEDICAL HISTORY:  Past Medical History:  Diagnosis Date  . Anxiety   . Arthritis   . Bilateral renal cysts   . Centrilobular emphysema ([place])   . Chronic low back pain   . CKD (chronic kidney disease), stage III   . Colon cancer [facility]) oncologist-  dr [name]   dx 01-23-2015  right colon Invasive adenocarcinoma into pericecal connective tissue and involving small intestine , Stage IIc, Grade 2, LVI(-),  (pT4b N0 M0) /  s/p resection termial ileum and cecum and chemotherapy 01-06-217 to 07-06-2015/   08/ 2017  per PET  recurrent w/ liver mets and peritoneal carcinomatosis  . Complication of anesthesia    can be combative due to dementia  . Full dentures   . History of gastroesophageal reflux (GERD)    changes diet  . History of pelvic fracture   . Hyperlipidemia   . Hypertension   . Liver metastasis ([place])   . Moderate dementia without behavioral disturbance    moderate to severe dementia  per neurologist note (dr Krista Blue) in epic  . Pulmonary nodules   . Right ovarian cyst   . Ureteral obstruction, right urologist-  dr [name]   malignant obstruction right ureter w/ colon cancer -- treated with ureteral stent placement    SURGICAL HISTORY: Past Surgical History:  Procedure Laterality Date  . CATARACT EXTRACTION W/ INTRAOCULAR LENS  IMPLANT, BILATERAL  2005 approx  . CYSTOSCOPY W/ URETERAL STENT PLACEMENT Right 12/03/2015   Procedure: CYSTOSCOPY WITH RETROGRADE PYELOGRAM/URETERAL STENT PLACEMENT;  Surgeon: Cleon Gustin, MD;  Location: [facility];  Service: Urology;  Laterality: Right;  . CYSTOSCOPY W/ URETERAL STENT PLACEMENT Right 02/15/2016   Procedure: CYSTOSCOPY WITH RETROGRADE PYELOGRAM/URETERAL STENT REPLACEMENT;  Surgeon: Cleon Gustin, MD;  Location: [facility];  Service: Urology;  Laterality: Right;  . CYSTOSCOPY W/ URETERAL STENT PLACEMENT Right 03/28/2016   Procedure: CYSTOSCOPY WITH RETROGRADE PYELOGRAM/URETERAL STENT EXCHANGE;  Surgeon: Cleon Gustin, MD;  Location: [facility];  Service: Urology;  Laterality: Right;  . CYSTOSCOPY W/ URETERAL STENT PLACEMENT Right 07/18/2016  Procedure: CYSTOSCOPY WITH RETROGRADE PYELOGRAM/ STENT EXCHANGE;  Surgeon: Cleon Gustin, MD;  Location: [facility];  Service: Urology;  Laterality: Right;  . CYSTOSCOPY WITH RETROGRADE PYELOGRAM, URETEROSCOPY AND STENT PLACEMENT Right 09/21/2015   Procedure: CYSTOSCOPY WITH BIL RETROGRADE PYELOGRAM, URETEROSCOPY, RIGHT STENT PLACEMENT ;  Surgeon: Cleon Gustin, MD;  Location: [facility];  Service: Urology;  Laterality: Right;  . LAPAROSCOPY N/A 01/23/2015   Procedure: DIAGNOSTIC LAPAROSCOPY, EXPLORATORY LAPAROTOMY  RIGHT HEMI-COLECTOMY FOR OBSTRUCTION OF RIGHT TERMINAL ILEUM;  Surgeon: [name], MD;  Location: [facility];  Service: General;  Laterality: N/A;    SOCIAL HISTORY: Social  History   Social History  . Marital status: Single    Spouse name: N/A  . Number of children: N/A  . Years of education: N/A   Occupational History  . Not on file.   Social History Main Topics  . Smoking status: Former Smoker    Packs/day: 0.25    Years: 50.00    Types: Cigarettes    Quit date: 01/25/2015  . Smokeless tobacco: Never Used  . Alcohol use 1.8 oz/week    2 Glasses of wine, 1 Cans of beer per week     Comment: occasional   . Drug use: No  . Sexual activity: Not on file   Other Topics Concern  . Not on file   Social History Narrative   Legally separated   Lives w/daughter, Tashauna Caisse   Has total of #2 daughters and #1 son   Dementia   Fall Risk-ambulates w/cane    FAMILY HISTORY: Family History  Problem Relation Age of Onset  . Diabetes Mellitus II Father   . Cancer Mother 79       ovarian cancer   . Cervical cancer Other     ALLERGIES:  is allergic to namenda [memantine hcl] and oxycodone.  MEDICATIONS:  Current Outpatient Prescriptions  Medication Sig Dispense Refill  . amLODipine (NORVASC) 5 MG tablet Take 5 mg by mouth every morning.     Marland Kitchen antiseptic oral rinse (BIOTENE) LIQD 15 mLs by Mouth Rinse route daily. BIOTENE CLINICAL    . Cholecalciferol (VITAMIN D3) 2000 UNITS TABS Take by mouth daily.    . Coenzyme Q10 (CO Q 10) 100 MG CAPS Take by mouth daily.    . Meth-Hyo-M Bl-Na Phos-Ph Sal (URIBEL) 118 MG CAPS Take 1 capsule (118 mg total) by mouth 2 (two) times daily as needed. 30 capsule 3  . mirabegron ER (MYRBETRIQ) 50 MG TB24 tablet Take 50 mg by mouth daily.    . mirtazapine (REMERON) 15 MG tablet Take 1 tablet (15 mg total) by mouth at bedtime. 30 tablet 3  . Multiple Vitamins-Minerals (OCUVITE-LUTEIN PO) Take 1 tablet by mouth daily.    . Multiple Vitamins-Minerals (WOMENS MULTIVITAMIN PO) Take 1 tablet by mouth daily.    . polyethylene glycol (MIRALAX / GLYCOLAX) packet Take 17 g by mouth daily. (Patient taking differently: Take  17 g by mouth daily as needed. ) 14 each 0  . UNABLE TO FIND Med Name: Closys Mouth Rinse two-three times daily for ulcer.    . vitamin B-12 (CYANOCOBALAMIN) 1000 MCG tablet Take 1,000 mcg by mouth daily.    Marland Kitchen donepezil (ARICEPT) 10 MG tablet TAKE 1 TABLET BY MOUTH EVERY DAY (Patient not taking: Reported on 07/29/2016) 30 tablet 11  . prochlorperazine (COMPAZINE) 5 MG tablet Take 1 tablet (5 mg total) by mouth every 6 (six) hours as needed for nausea or  vomiting. (Patient not taking: Reported on 07/29/2016) 30 tablet 0   No current facility-administered medications for this visit.     REVIEW OF SYSTEMS:  Constitutional: Denies fevers, chills or abnormal night sweats (+) fatigue  Eyes: Denies blurriness of vision, double vision or watery eyes  Ears, nose, mouth, throat, and face: Denies mucositis or sore throat Respiratory: Denies cough, dyspnea or wheezes Cardiovascular: Denies palpitation, chest discomfort or lower extremity swelling Gastrointestinal:  Denies heartburn (+) diarrhea (+) Nausea Skin: Denies abnormal skin rashes Lymphatics: Denies new lymphadenopathy or easy bruising Neurological:Denies numbness, tingling or new weaknesses Behavioral/Psych: Mood is stable, no new changes (+) Dementia All other systems were reviewed with the patient and are negative.  PHYSICAL EXAMINATION:  ECOG PERFORMANCE STATUS: 1 - Symptomatic but completely ambulatory  Vitals:   07/29/16 1150  BP: (!) 147/87  Pulse: 95  Resp: 18  Temp: 97.7 F (36.5 C)   Filed Weights   07/29/16 1150  Weight: 122 lb 6.4 oz (55.5 kg)   GENERAL:alert, no distress and comfortable SKIN: skin color, texture, turgor are normal, no rashes or significant lesions EYES: normal, conjunctiva are pink and non-injected, sclera clear (+) difficult to evaluate due to her dementia OROPHARYNX:no exudate and tongue normal (+) Upper and lower dentures  NECK: supple, thyroid normal size, non-tender, without nodularity LYMPH:  no  palpable lymphadenopathy in the cervical, axillary or inguinal LUNGS: clear to auscultation and percussion with normal breathing effort HEART: regular rate & rhythm and no murmurs and no lower extremity edema ABDOMEN:abdomen soft, non-tender and normal bowel sounds  Musculoskeletal:no cyanosis of digits and no clubbing  PSYCH: alert & oriented x 3 with fluent speech (+) dementia worsening NEURO: no focal motor/sensory deficits  LABORATORY DATA:  I have reviewed the data as listed CBC Latest Ref Rng & Units 07/29/2016 07/15/2016 07/01/2016  WBC 3.9 - 10.3 10e3/uL 8.5 11.4(H) 8.5  Hemoglobin 11.6 - 15.9 g/dL 11.0(L) 10.5(L) 11.2(L)  Hematocrit 34.8 - 46.6 % 33.3(L) 32.0(L) 34.1(L)  Platelets 145 - 400 10e3/uL 442(H) 623(H) 396    CMP Latest Ref Rng & Units 07/29/2016 07/15/2016 07/01/2016  Glucose 70 - 140 mg/dl 81 85 74  BUN 7.0 - 26.0 mg/dL 13.7 13.8 11.6  Creatinine 0.6 - 1.1 mg/dL 1.1 1.2(H) 1.4(H)  Sodium 136 - 145 mEq/L 142 143 143  Potassium 3.5 - 5.1 mEq/L 3.8 4.2 4.0  Chloride 101 - 111 mmol/L - - -  CO2 22 - 29 mEq/L '25 22 24  '$ Calcium 8.4 - 10.4 mg/dL 9.4 9.9 9.5  Total Protein 6.4 - 8.3 g/dL 7.6 7.9 7.5  Total Bilirubin 0.20 - 1.20 mg/dL 0.32 0.27 0.31  Alkaline Phos 40 - 150 U/L 81 80 82  AST 5 - 34 U/L '13 17 14  '$ ALT 0 - 55 U/L '10 13 8   '$ CEA  01/23/2015: 5.7 06/05/2015: 6.1 08/04/2015: 10.3 10/12/2015: 13.29 (in-house)  11/04/2015: 12.83 01/13/2016: 8.57 02/24/2016: 8.41 04/27/2016: 9.22 05/20/2016: 8.32 06/17/2016: 6.45 07/15/2016: 5.07  PATHOLOGY REPORT: Diagnosis 01/23/2015 Colon, segmental resection, with terminal ileum and cecum - INVASIVE ADENOCARCINOMA EXTENDING INTO PERICECAL CONNECTIVE TISSUE AND INVOLVING SMALL INTESTINE. - THREE BENIGN LYMPH NODES (0/3). - SEE ONCOLOGY TABLE BELOW.  Microscopic Comment COLON AND RECTUM (INCLUDING TRANS-ANAL RESECTION): Specimen: Terminal ileum and cecum. Procedure: Resection. Tumor site: Cecum adjacent to  appendix. Specimen integrity: Intact. Macroscopic intactness of mesorectum: Not applicable. Macroscopic tumor perforation: No. Invasive tumor: Maximum size: 4.0 cm. Histologic type(s): Colorectal adenocarcinoma. Histologic grade and differentiation: G2: moderately differentiated/low  grade. Type of polyp in which invasive carcinoma arose: No residual polyp. Microscopic extension of invasive tumor: Into pericecal connective tissue and terminal ileum. Lymph-Vascular invasion: Not identified. Peri-neural invasion: Not identified. Tumor deposit(s) (discontinuous extramural extension): No. Resection margins: Proximal margin: Free of tumor. Distal margin: Free of tumor. Circumferential (radial) (posterior ascending, posterior descending; lateral and posterior mid-rectum; and entire lower 1/3 rectum): Free of tumor. Mesenteric margin (sigmoid and transverse): N/A. Distance closest margin (if all above margins negative): N/A. Treatment effect (neo-adjuvant therapy): No. Additional polyp(s): No. Non-neoplastic findings: N/A. Lymph nodes: number examined 3; number positive: 0. Pathologic Staging: pT4b, pN0, pMX. Ancillary studies: Mismatch repair protein by immunohistochemistry pending. Comments: There is a colorectal-type adenocarcinoma arising in the cecum in the area of the appendiceal orifice. The tumor extends into the pericecal connective tissue, and involves the attached segment of terminal ileum. Immunohistochemistry shows the tumor is positive with CDX2 and cytokeratin 20, and negative with cytokeratin 7, estrogen receptor and WT-1 supporting a diagnosis of primary colorectal adenocarcinoma. ADDITIONAL INFORMATION: Mismatch Repair (MMR) Protein Immunohistochemistry (IHC) IHC Expression Result: MLH1: Preserved nuclear expression (greater 50% tumor expression) MSH2: Preserved nuclear expression (greater 50% tumor expression) MSH6: Preserved nuclear expression (greater 50% tumor  expression) PMS2: Preserved nuclear expression (greater 50% tumor expression) * Internal control demonstrates intact nuclear expression Interpretation: NORMAL There is preserved expression of the major and minor MMR proteins. There is a very low probability that microsatellite instability (MSI) is present. However, certain clinically significant MMR protein mutations may result in preservation of nuclear expression. It is recommended that the preservation of protein expression be correlated with molecular based MSI testing.    RADIOGRAPHIC STUDIES: I have personally reviewed the radiological images as listed and agreed with the findings in the report.  CT CAP w Contrast 05/19/2016 IMPRESSION: 1. Numerous small solid pulmonary nodules, one of which is new, and several of which have mildly increased in size since 10/28/2015, worrisome for mild progression of pulmonary metastases. 2. Solitary right liver lobe metastasis is mildly increased in size since 01/08/2016. 3. Peritoneal metastases have mildly increased in size since 01/08/2016, largest in the right pelvic sidewall. 4. Well-positioned right nephroureteral stent without overt right hydronephrosis. 5. Additional findings include aortic atherosclerosis, 1 vessel coronary atherosclerosis and mild to moderate emphysema.  Ct chest, Abdomen Pelvis Wo Contrast 01/08/2016  IMPRESSION: 1. 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 2. Hepatic and renal cysts. 3.  Aortoiliac atherosclerotic vascular disease. 4. Lumbar spondylosis and degenerative disc disease with multilevel impingement.  ASSESSMENT & PLAN: 76 y.o. African-American female, with past medical history of moderate dementia, independent with ADLs, hypertension, presents with small bowel obstruction and weight loss.  1. 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 -I personally reviewed her recent PET scan images, unfortunately the scan showed hypermetabolic peritoneal and liver metastases. 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 -She started Xeloda '1500mg'$  bid, 2 weeks on and one week off. -I previously reviewed her [facility] one genomic testing as always patient and her daughter,  her tumor has K-ras mutation, she is not a candidate for EGFR inhibitor. The tumor has MSI stable, she is not a candidate for immunotherapy, although she may be a candidate for clinical trial of immunotherapy plus MEK inhibitor at [facility]. -she tolerated Xeloda and Avastin well -I previously reviewed her restaging CT scan from 01/08/2016, which showed overall stable disease, no new lesions. We'll continue her chemotherapy. -I previously reviewed the CT from 05/19/2016 with the patient and her daughter in detail. She has had a moderate disease progression in lungs, liver and peritoneum. I recommended her to change treatment. -she is now on second line single agent rinotecan at a reduced dose '150mg'$ /m2, tolerated well, will continue. -We'll continue Avastin -The goal of therapy is palliative, mainly to improve her quality of life. -she is actually doing well on chemo, no significant side effects from chemotherapy or cancer-related symptoms. Will continue chemo for now  -If she develops poor tolerance to chemotherapy, or further disease progression on next scan, I will likely stop chemotherapy and change to palliative care alone, due to her advanced age and  comorbidities, especially dementia. -Lab previously reviewed, adequate for treatment, 5th cycle Irinotecan and Avastin today (5/25) -  repeat PET scan before next cycle  2. CKD stage III, right hydroureter -Her Cr was around 1 in 08/2013, Cr increased to 2.4 on 01/20/2015 when she was diagnosed with colon cancer due to dehydration, improved some and has been around 1.3-1.5 lately.  -A previous CT scan showed right hydroureter, which probably contributes to her renal insufficiency. -she had ureter stent placement again in 12/2015, 03/2016, and 04/2016 -She is scheduled to have a stent replacement on 07/18/16. - her renal function has improved lately, Cr is 1.1 today   3. HTN, dementia -She will continue her medication and follow-up with her primary care physician. -Her BP has been better controlled lately, we discussed that Avastin can cause hypertension, we'll continue monitoring closely. -Her dementia has been getting worse gradually, which has increased her care buren on her daughter. I previously discussed burn-out prevention with her daughter, she appreciated and has planned to have a care-giver for her mother   6. Nausea and diarrhea -Secondary to chemotherapy, overall very mild and manageable  -She is to take Imodium PRN and her nausea medications.  8. Goal of care discussion  -We previously discussed the incurable nature of her cancer, and the overall poor prognosis, especially if she has progress on chemo -The patient understands the goal of care is palliative. -Due to her advanced age and comorbidities, especially dementia, I have low threshold to switch her to palliative care alone if she does not tolerate chemo well or dementia gets worse, her daughter is on -board  -I recommend DNR/DNI, pt and her daughter agrees.    Plan -Lab reviewed, adequate for treatment, we'll proceed to cycle 5 Irinotecan and Avastin today and continue every 2 weeks -F/u in 2 weeks. - schedule PET scan  before next cycle   All questions were answered. The patient knows to call the clinic with any problems, questions or concerns.  I spent 25 minutes counseling the patient face to face. The total time spent in the appointment was 30 minutes and more than 50% was on counseling.   [name], MD 07/29/2016    This document serves as a record of services personally performed by [name], MD. It was created on her behalf by Brandt Loosen, a trained medical scribe. The creation of this record is  based on the scribe's personal observations and the provider's statements to them. This document has been checked and approved by the attending provider.

## 2016-07-29 ENCOUNTER — Other Ambulatory Visit (HOSPITAL_BASED_OUTPATIENT_CLINIC_OR_DEPARTMENT_OTHER): Payer: Medicare Other

## 2016-07-29 ENCOUNTER — Telehealth: Payer: Self-pay | Admitting: Hematology

## 2016-07-29 ENCOUNTER — Ambulatory Visit (HOSPITAL_BASED_OUTPATIENT_CLINIC_OR_DEPARTMENT_OTHER): Payer: Medicare Other | Admitting: Hematology

## 2016-07-29 ENCOUNTER — Ambulatory Visit (HOSPITAL_BASED_OUTPATIENT_CLINIC_OR_DEPARTMENT_OTHER): Payer: Medicare Other

## 2016-07-29 VITALS — BP 147/87 | HR 95 | Temp 97.7°F | Resp 18 | Ht 66.0 in | Wt 122.4 lb

## 2016-07-29 VITALS — BP 144/80

## 2016-07-29 DIAGNOSIS — N183 Chronic kidney disease, stage 3 (moderate): Secondary | ICD-10-CM | POA: Diagnosis not present

## 2016-07-29 DIAGNOSIS — Z5112 Encounter for antineoplastic immunotherapy: Secondary | ICD-10-CM

## 2016-07-29 DIAGNOSIS — C182 Malignant neoplasm of ascending colon: Secondary | ICD-10-CM

## 2016-07-29 DIAGNOSIS — Z5111 Encounter for antineoplastic chemotherapy: Secondary | ICD-10-CM | POA: Diagnosis not present

## 2016-07-29 DIAGNOSIS — I10 Essential (primary) hypertension: Secondary | ICD-10-CM

## 2016-07-29 DIAGNOSIS — C786 Secondary malignant neoplasm of retroperitoneum and peritoneum: Secondary | ICD-10-CM | POA: Diagnosis not present

## 2016-07-29 DIAGNOSIS — F0390 Unspecified dementia without behavioral disturbance: Secondary | ICD-10-CM

## 2016-07-29 DIAGNOSIS — C180 Malignant neoplasm of cecum: Secondary | ICD-10-CM

## 2016-07-29 DIAGNOSIS — D638 Anemia in other chronic diseases classified elsewhere: Secondary | ICD-10-CM

## 2016-07-29 LAB — COMPREHENSIVE METABOLIC PANEL
ALBUMIN: 3.3 g/dL — AB (ref 3.5–5.0)
ALK PHOS: 81 U/L (ref 40–150)
ALT: 10 U/L (ref 0–55)
ANION GAP: 10 meq/L (ref 3–11)
AST: 13 U/L (ref 5–34)
BILIRUBIN TOTAL: 0.32 mg/dL (ref 0.20–1.20)
BUN: 13.7 mg/dL (ref 7.0–26.0)
CALCIUM: 9.4 mg/dL (ref 8.4–10.4)
CO2: 25 mEq/L (ref 22–29)
CREATININE: 1.1 mg/dL (ref 0.6–1.1)
Chloride: 107 mEq/L (ref 98–109)
EGFR: 55 mL/min/{1.73_m2} — ABNORMAL LOW (ref >90)
Glucose: 81 mg/dl (ref 70–140)
Potassium: 3.8 mEq/L (ref 3.5–5.1)
Sodium: 142 mEq/L (ref 136–145)
TOTAL PROTEIN: 7.6 g/dL (ref 6.4–8.3)

## 2016-07-29 LAB — CBC WITH DIFFERENTIAL/PLATELET
BASO%: 1.1 % (ref 0.0–2.0)
Basophils Absolute: 0.1 10*3/uL (ref 0.0–0.1)
EOS ABS: 0.1 10*3/uL (ref 0.0–0.5)
EOS%: 1.6 % (ref 0.0–7.0)
HEMATOCRIT: 33.3 % — AB (ref 34.8–46.6)
HGB: 11 g/dL — ABNORMAL LOW (ref 11.6–15.9)
LYMPH#: 1.4 10*3/uL (ref 0.9–3.3)
LYMPH%: 16.6 % (ref 14.0–49.7)
MCH: 30.2 pg (ref 25.1–34.0)
MCHC: 33.1 g/dL (ref 31.5–36.0)
MCV: 91 fL (ref 79.5–101.0)
MONO#: 0.6 10*3/uL (ref 0.1–0.9)
MONO%: 6.8 % (ref 0.0–14.0)
NEUT%: 73.9 % (ref 38.4–76.8)
NEUTROS ABS: 6.3 10*3/uL (ref 1.5–6.5)
PLATELETS: 442 10*3/uL — AB (ref 145–400)
RBC: 3.66 10*6/uL — ABNORMAL LOW (ref 3.70–5.45)
RDW: 18.7 % — ABNORMAL HIGH (ref 11.2–14.5)
WBC: 8.5 10*3/uL (ref 3.9–10.3)

## 2016-07-29 LAB — CEA (IN HOUSE-CHCC): CEA (CHCC-IN HOUSE): 6.51 ng/mL — AB (ref 0.00–5.00)

## 2016-07-29 MED ORDER — IRINOTECAN HCL CHEMO INJECTION 100 MG/5ML
150.0000 mg/m2 | Freq: Once | INTRAVENOUS | Status: AC
  Administered 2016-07-29: 240 mg via INTRAVENOUS
  Filled 2016-07-29: qty 10

## 2016-07-29 MED ORDER — DEXAMETHASONE SODIUM PHOSPHATE 10 MG/ML IJ SOLN
10.0000 mg | Freq: Once | INTRAMUSCULAR | Status: AC
  Administered 2016-07-29: 10 mg via INTRAVENOUS

## 2016-07-29 MED ORDER — PALONOSETRON HCL INJECTION 0.25 MG/5ML
INTRAVENOUS | Status: AC
  Filled 2016-07-29: qty 5

## 2016-07-29 MED ORDER — PALONOSETRON HCL INJECTION 0.25 MG/5ML
0.2500 mg | Freq: Once | INTRAVENOUS | Status: AC
  Administered 2016-07-29: 0.25 mg via INTRAVENOUS

## 2016-07-29 MED ORDER — SODIUM CHLORIDE 0.9 % IV SOLN
5.0000 mg/kg | Freq: Once | INTRAVENOUS | Status: AC
  Administered 2016-07-29: 300 mg via INTRAVENOUS
  Filled 2016-07-29: qty 12

## 2016-07-29 MED ORDER — SODIUM CHLORIDE 0.9 % IV SOLN
Freq: Once | INTRAVENOUS | Status: AC
  Administered 2016-07-29: 14:00:00 via INTRAVENOUS

## 2016-07-29 MED ORDER — DEXAMETHASONE SODIUM PHOSPHATE 10 MG/ML IJ SOLN
INTRAMUSCULAR | Status: AC
  Filled 2016-07-29: qty 1

## 2016-07-29 NOTE — Patient Instructions (Signed)
[facility] Discharge Instructions for Patients Receiving Chemotherapy  Today you received the following chemotherapy agents:  Avastin, Irinotecan  To help prevent nausea and vomiting after your treatment, we encourage you to take your nausea medication as prescribed.   If you develop nausea and vomiting that is not controlled by your nausea medication, call the clinic.   BELOW ARE SYMPTOMS THAT SHOULD BE REPORTED IMMEDIATELY:  *FEVER GREATER THAN 100.5 F  *CHILLS WITH OR WITHOUT FEVER  NAUSEA AND VOMITING THAT IS NOT CONTROLLED WITH YOUR NAUSEA MEDICATION  *UNUSUAL SHORTNESS OF BREATH  *UNUSUAL BRUISING OR BLEEDING  TENDERNESS IN MOUTH AND THROAT WITH OR WITHOUT PRESENCE OF ULCERS  *URINARY PROBLEMS  *BOWEL PROBLEMS  UNUSUAL RASH Items with * indicate a potential emergency and should be followed up as soon as possible.  Feel free to call the clinic you have any questions or concerns. The clinic phone number is [phone].  Please show the [place] at check-in to the Emergency Department and triage nurse.

## 2016-07-29 NOTE — Telephone Encounter (Signed)
Gave patient AVS and calender per 5/25 LOS.  

## 2016-07-30 ENCOUNTER — Encounter: Payer: Self-pay | Admitting: Hematology

## 2016-08-04 MED FILL — AMLODIPINE BESYLATE 5 MG TA: 5 | 30 days supply | Qty: 30 | Fill #0

## 2016-08-06 NOTE — Addendum Note (Signed)
Addendum  created 08/06/16 0830 by Duane Boston, MD   Sign clinical note

## 2016-08-08 NOTE — Addendum Note (Signed)
Addendum  created 08/08/16 1515 by [name] Mouse, MD   Sign clinical note

## 2016-08-09 ENCOUNTER — Ambulatory Visit (HOSPITAL_COMMUNITY)
Admission: RE | Admit: 2016-08-09 | Discharge: 2016-08-09 | Disposition: A | Discharge status: Home / Self Care | Payer: Medicare Other | Source: Ambulatory Visit | Attending: Hematology | Admitting: Hematology

## 2016-08-09 DIAGNOSIS — C182 Malignant neoplasm of ascending colon: Secondary | ICD-10-CM | POA: Diagnosis present

## 2016-08-09 DIAGNOSIS — R918 Other nonspecific abnormal finding of lung field: Secondary | ICD-10-CM | POA: Diagnosis not present

## 2016-08-09 LAB — GLUCOSE, CAPILLARY: GLUCOSE-CAPILLARY: 95 mg/dL (ref 65–99)

## 2016-08-09 MED ORDER — FLUDEOXYGLUCOSE F - 18 (FDG) INJECTION
6.0000 | Freq: Once | INTRAVENOUS | Status: AC | PRN
  Administered 2016-08-09: 6 via INTRAVENOUS

## 2016-08-11 NOTE — Progress Notes (Signed)
[place]  [phone] [phone]  Clinic Follow Up Note   Patient Care Team: [name], MD as PCP - General (Family Medicine) [name], MD as Consulting Physician (Medical Oncology) [name], MD as Consulting Physician (General Surgery) [name], MD as Consulting Physician (Orthopedic Surgery) [name] [name], MD as Consulting Physician (Urology) 08/12/2016     CHIEF COMPLAINTS:  Follow up metastatic colon cancer  Oncology History   Cancer of right colon [facility])   Staging form: Colon and Rectum, AJCC 7th Edition     Pathologic stage from 01/23/2015: Stage IIC (T4b, N0, cM0) - Signed by [name], MD on 02/05/2015       Cancer of right colon ([place])   01/21/2015 Imaging     CT abdomen and pelvis showed high-grade small bowel obstruction,  right I [place] ureteral nephrosis,  multiple liver cysts.  CT chest was negative for metastatic lesion.      01/23/2015 Pathology Results     invasive adenocarcinoma extending into the pericecal connective tissue,  grade 2, LVI (-),  no perineural invasion, 3 lymph nodes were negative.      01/23/2015 Surgery      terminal ileum and cecum seegmental resection with anastomosis by Dr. Hassell Done      01/23/2015 Initial Diagnosis    Cancer of right colon ([place])      03/13/2015 - 07/06/2015 Chemotherapy    Xeloda 1500 mg bid X 14 days on-7 off, s/p 5 cycles, stopped early due to wrosening renal function      10/28/2015 Progression    PET scan showed multiple hypermetabolic peritoneal nodules, right ovarian cystic mass, and hypermetabolic liver lesion, highly suspicious for metastatic colon cancer. Small pulmonary nodules are indeterminate.       11/11/2015 - 05/20/2016 Chemotherapy    Xeloda '1500mg'$  bid, 2 weeks on and one week off, Avastin was added on from cycle 3. Xeloda held from 05/20/16 due to progression on CT from 05/19/16 and total bilirubin increased to 2.6.      01/08/2016 Imaging   Ct chest, Abdomen Pelvis Wo Contrast 01/08/2016  IMPRESSION: 1. 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 2. Hepatic and renal cysts. 3.  Aortoiliac atherosclerotic vascular disease. 4. Lumbar spondylosis and degenerative disc disease with multilevel Impingement.      05/19/2016 Imaging    CT CAP w Contrast IMPRESSION: 1. Numerous small solid pulmonary nodules, one of which is new, and several of which have mildly increased in size since 10/28/2015, worrisome for mild progression of pulmonary metastases. 2. Solitary right liver lobe metastasis is mildly increased in size since 01/08/2016. 3. Peritoneal metastases have mildly increased in size since 01/08/2016, largest in the right pelvic sidewall. 4. Well-positioned right nephroureteral stent without overt right hydronephrosis. 5. Additional findings include aortic atherosclerosis, 1 vessel coronary atherosclerosis and mild to moderate emphysema.      06/03/2016 -  Chemotherapy    Irinotecan and Avastin every 2 weeks starting 06/03/16      07/29/2016 Tumor Marker    CEA 6.51       08/09/2016 Imaging    PET  IMPRESSION: 1. Overall mild improvement compared to FDG PET scan of 10/28/2015. 2. Pulmonary nodules are increased in size from PET-CT scan and similar size to most recent CT scan. Largest lesion RIGHT lower lobe does have associated faint metabolic activity consistent with pulmonary metastasis. 3. Decrease in metabolic activity of RIGHT hepatic lobe lesion. Potential second  lesion LEFT hepatic lobe with slightly lower metabolic activity. 4. Decreased metabolic activity at the enteric colonic anastomosis in the RIGHT lower quadrant. 5. Interval decrease in metabolic activity of peritoneal nodular metastasis. 6. Nodular implant implant along ventral abdominal wall may be within the musculature, but also improved.       HISTORY OF PRESENTING ILLNESS  (02/05/2015):  Beth Wilkins 76 y.o. female  with past medical history of moderate dementia, hypertension, is here because of recently diagnosed stage II colon cancer. She is accompanied to the clinic by her daughter. History is mainly obtained from the chart and her daughter.  She has had abdominal discomfort, nausea and vomiting, and norexia for  the past to 3 months along with 15 pounds weight loss, . Her nausea was mild and intermittent, she does not seek medical attention initially. She presented with  worsening symptoms for 2-3 weeks, and episodes of projectile vomiting and dehydration to emergency room, was admitted to [facility] on 01/20/2015. CT scan reviewed small bowel obstruction, and possible appendiceal rupture. She was brought to OR on 01/23/2015, underwent right hemicolectomy with anastomosis by Dr. Hassell Done. Surgical path reviewed adenocarcinoma at the cecum. She was discharged home on 01/26/2015.  She has recovered well, eating much better than prior to surgery, move around without restriction. No pain, she has loose BM 3 times a day, and she has backed off her mirealax.  she lives with her daughter. She states her appetite is moderate, sometime low, and she refuses to eat if she doesn't have appetite. No nausea, vomiting, abdominal bloating since her surgery.  Her last colonoscopy was 6-8 years ago, and her stool OB was negative this year.   CURRENT THERAPY: Irinotecan and Avastin every 2 weeks started 06/03/16  INTERIM HISTORY: Beth Wilkins presents to the clinic today for follow up and cycle treatment with her daughter who is providing most of the history. She has been doing well and had no diarrhea from previous chemo cycle. Her appetite has been improving but she only mostly eats salads. Denies any nausea or pain.    MEDICAL HISTORY:  Past Medical History:  Diagnosis Date  . Anxiety   . Arthritis   . Bilateral renal cysts   . Centrilobular emphysema ([place])   . Chronic low back  pain   . CKD (chronic kidney disease), stage III   . Colon cancer [facility]) oncologist-  dr [name]   dx 01-23-2015  right colon Invasive adenocarcinoma into pericecal connective tissue and involving small intestine , Stage IIc, Grade 2, LVI(-),  (pT4b N0 M0) /  s/p resection termial ileum and cecum and chemotherapy 01-06-217 to 07-06-2015/   08/ 2017  per PET  recurrent w/ liver mets and peritoneal carcinomatosis  . Complication of anesthesia    can be combative due to dementia  . Full dentures   . History of gastroesophageal reflux (GERD)    changes diet  . History of pelvic fracture   . Hyperlipidemia   . Hypertension   . Liver metastasis ([place])   . 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 Pulmonary nodules   . Right ovarian cyst   . Ureteral obstruction, right urologist-  dr [name]   malignant obstruction right ureter w/ colon cancer -- treated with ureteral stent placement    SURGICAL HISTORY: Past Surgical History:  Procedure Laterality Date  . CATARACT EXTRACTION W/ INTRAOCULAR LENS  IMPLANT, BILATERAL  2005 approx  . CYSTOSCOPY W/ URETERAL STENT PLACEMENT Right 12/03/2015   Procedure: CYSTOSCOPY WITH RETROGRADE PYELOGRAM/URETERAL STENT PLACEMENT;  Surgeon: Cleon Gustin, MD;  Location: [facility];  Service: Urology;  Laterality: Right;  . CYSTOSCOPY W/ URETERAL STENT PLACEMENT Right 02/15/2016   Procedure: CYSTOSCOPY WITH RETROGRADE PYELOGRAM/URETERAL STENT REPLACEMENT;  Surgeon: Cleon Gustin, MD;  Location: [facility];  Service: Urology;  Laterality: Right;  . CYSTOSCOPY W/ URETERAL STENT PLACEMENT Right 03/28/2016   Procedure: CYSTOSCOPY WITH RETROGRADE PYELOGRAM/URETERAL STENT EXCHANGE;  Surgeon: Cleon Gustin, MD;  Location: [facility];  Service: Urology;  Laterality: Right;  . CYSTOSCOPY W/ URETERAL STENT PLACEMENT Right 07/18/2016    Procedure: CYSTOSCOPY WITH RETROGRADE PYELOGRAM/ STENT EXCHANGE;  Surgeon: Cleon Gustin, MD;  Location: [facility];  Service: Urology;  Laterality: Right;  . CYSTOSCOPY WITH RETROGRADE PYELOGRAM, URETEROSCOPY AND STENT PLACEMENT Right 09/21/2015   Procedure: CYSTOSCOPY WITH BIL RETROGRADE PYELOGRAM, URETEROSCOPY, RIGHT STENT PLACEMENT ;  Surgeon: Cleon Gustin, MD;  Location: [facility];  Service: Urology;  Laterality: Right;  . LAPAROSCOPY N/A 01/23/2015   Procedure: DIAGNOSTIC LAPAROSCOPY, EXPLORATORY LAPAROTOMY  RIGHT HEMI-COLECTOMY FOR OBSTRUCTION OF RIGHT TERMINAL ILEUM;  Surgeon: [name], MD;  Location: [facility];  Service: General;  Laterality: N/A;    SOCIAL HISTORY: Social History   Social History  . Marital status: Single    Spouse name: N/A  . Number of children: N/A  . Years of education: N/A   Occupational History  . Not on file.   Social History Main Topics  . Smoking status: Former Smoker    Packs/day: 0.25    Years: 50.00    Types: Cigarettes    Quit date: 01/25/2015  . Smokeless tobacco: Never Used  . Alcohol use 1.8 oz/week    2 Glasses of wine, 1 Cans of beer per week     Comment: occasional   . Drug use: No  . Sexual activity: Not on file   Other Topics Concern  . Not on file   Social History Narrative   Legally separated   Lives w/daughter, Jaylenn Baiza   Has total of #2 daughters and #1 son   Dementia   Fall Risk-ambulates w/cane    FAMILY HISTORY: Family History  Problem Relation Age of Onset  . Diabetes Mellitus II Father   . Cancer Mother 67       ovarian cancer   . Cervical cancer Other     ALLERGIES:  is allergic to namenda [memantine hcl] and oxycodone.  MEDICATIONS:  Current Outpatient Prescriptions  Medication Sig Dispense Refill  . amLODipine (NORVASC) 5 MG tablet Take 5 mg by mouth every morning.     . Cholecalciferol (VITAMIN D3) 2000 UNITS TABS Take by mouth daily.    .  Coenzyme Q10 (CO Q 10) 100 MG CAPS Take by mouth daily.    Marland Kitchen donepezil (ARICEPT) 10 MG tablet TAKE 1 TABLET BY MOUTH EVERY DAY 30 tablet 11  . Meth-Hyo-M Bl-Na Phos-Ph Sal (URIBEL) 118 MG CAPS Take 1 capsule (118 mg total) by mouth 2 (two) times daily as needed. 30 capsule 3  . mirabegron ER (MYRBETRIQ) 50 MG TB24 tablet Take 50 mg by mouth daily.    . mirtazapine (REMERON) 15 MG tablet Take 1 tablet (15 mg total) by mouth at bedtime. 30 tablet 3  . Multiple Vitamins-Minerals (OCUVITE-LUTEIN PO) Take 1 tablet by mouth daily.    . Multiple Vitamins-Minerals (  WOMENS MULTIVITAMIN PO) Take 1 tablet by mouth daily.    . polyethylene glycol (MIRALAX / GLYCOLAX) packet Take 17 g by mouth daily. (Patient taking differently: Take 17 g by mouth daily as needed. ) 14 each 0  . prochlorperazine (COMPAZINE) 5 MG tablet Take 1 tablet (5 mg total) by mouth every 6 (six) hours as needed for nausea or vomiting. 30 tablet 0  . UNABLE TO FIND Med Name: Closys Mouth Rinse two-three times daily for ulcer.    . vitamin B-12 (CYANOCOBALAMIN) 1000 MCG tablet Take 1,000 mcg by mouth daily.     No current facility-administered medications for this visit.     REVIEW OF SYSTEMS:  Constitutional: Denies fevers, chills or abnormal night sweats (+) fatigue (+)lost 3 pounds Eyes: Denies blurriness of vision, double vision or watery eyes  Ears, nose, mouth, throat, and face: Denies mucositis or sore throat Respiratory: Denies cough, dyspnea or wheezes Cardiovascular: Denies palpitation, chest discomfort or lower extremity swelling Gastrointestinal:  Denies heartburn  Skin: Denies abnormal skin rashes Lymphatics: Denies new lymphadenopathy or easy bruising Neurological:Denies numbness, tingling or new weaknesses Behavioral/Psych: Mood is stable, no new changes (+) Dementia All other systems were reviewed with the patient and are negative.  PHYSICAL EXAMINATION:  ECOG PERFORMANCE STATUS: 1 - Symptomatic but completely  ambulatory  Vitals:   08/12/16 0837  BP: (!) 109/59  Pulse: 94  Resp: 18  Temp: 97.7 F (36.5 C)   Filed Weights   08/12/16 0837  Weight: 119 lb 4.8 oz (54.1 kg)   GENERAL:alert, no distress and comfortable SKIN: skin color, texture, turgor are normal, no rashes or significant lesions EYES: normal, conjunctiva are pink and non-injected, sclera clear (+) difficult to evaluate due to her dementia OROPHARYNX:no exudate and tongue normal (+) Upper and lower dentures  NECK: supple, thyroid normal size, non-tender, without nodularity LYMPH:  no palpable lymphadenopathy in the cervical, axillary or inguinal LUNGS: clear to auscultation and percussion with normal breathing effort HEART: regular rate & rhythm and no murmurs and no lower extremity edema ABDOMEN:abdomen soft, non-tender and normal bowel sounds  Musculoskeletal:no cyanosis of digits and no clubbing  PSYCH: alert & oriented x 3 with fluent speech (+) dementia  NEURO: no focal motor/sensory deficits  LABORATORY DATA:  I have reviewed the data as listed CBC Latest Ref Rng & Units 08/12/2016 07/29/2016 07/15/2016  WBC 3.9 - 10.3 10e3/uL 6.1 8.5 11.4(H)  Hemoglobin 11.6 - 15.9 g/dL 10.8(L) 11.0(L) 10.5(L)  Hematocrit 34.8 - 46.6 % 33.9(L) 33.3(L) 32.0(L)  Platelets 145 - 400 10e3/uL 480 few platelet clumps(H) 442(H) 623(H)    CMP Latest Ref Rng & Units 08/12/2016 07/29/2016 07/15/2016  Glucose 70 - 140 mg/dl 84 81 85  BUN 7.0 - 26.0 mg/dL 16.2 13.7 13.8  Creatinine 0.6 - 1.1 mg/dL 1.3(H) 1.1 1.2(H)  Sodium 136 - 145 mEq/L 146(H) 142 143  Potassium 3.5 - 5.1 mEq/L 4.1 3.8 4.2  Chloride 101 - 111 mmol/L - - -  CO2 22 - 29 mEq/L '22 25 22  '$ Calcium 8.4 - 10.4 mg/dL 9.6 9.4 9.9  Total Protein 6.4 - 8.3 g/dL 7.5 7.6 7.9  Total Bilirubin 0.20 - 1.20 mg/dL 0.33 0.32 0.27  Alkaline Phos 40 - 150 U/L 77 81 80  AST 5 - 34 U/L '15 13 17  '$ ALT 0 - 55 U/L '12 10 13   '$ CEA  01/23/2015: 5.7 06/05/2015: 6.1 08/04/2015: 10.3 10/12/2015: 13.29  (in-house)  11/04/2015: 12.83 01/13/2016: 8.57 02/24/2016: 8.41 04/27/2016: 9.22 05/20/2016:  8.32 06/17/2016: 6.45 07/15/2016: 5.07 07/29/2016: 6.51  PATHOLOGY REPORT: Diagnosis 01/23/2015 Colon, segmental resection, with terminal ileum and cecum - INVASIVE ADENOCARCINOMA EXTENDING INTO PERICECAL CONNECTIVE TISSUE AND INVOLVING SMALL INTESTINE. - THREE BENIGN LYMPH NODES (0/3). - SEE ONCOLOGY TABLE BELOW.  Microscopic Comment COLON AND RECTUM (INCLUDING TRANS-ANAL RESECTION): Specimen: Terminal ileum and cecum. Procedure: Resection. Tumor site: Cecum adjacent to appendix. Specimen integrity: Intact. Macroscopic intactness of mesorectum: Not applicable. Macroscopic tumor perforation: No. Invasive tumor: Maximum size: 4.0 cm. Histologic type(s): Colorectal adenocarcinoma. Histologic grade and differentiation: G2: moderately differentiated/low grade. Type of polyp in which invasive carcinoma arose: No residual polyp. Microscopic extension of invasive tumor: Into pericecal connective tissue and terminal ileum. Lymph-Vascular invasion: Not identified. Peri-neural invasion: Not identified. Tumor deposit(s) (discontinuous extramural extension): No. Resection margins: Proximal margin: Free of tumor. Distal margin: Free of tumor. Circumferential (radial) (posterior ascending, posterior descending; lateral and posterior mid-rectum; and entire lower 1/3 rectum): Free of tumor. Mesenteric margin (sigmoid and transverse): N/A. Distance closest margin (if all above margins negative): N/A. Treatment effect (neo-adjuvant therapy): No. Additional polyp(s): No. Non-neoplastic findings: N/A. Lymph nodes: number examined 3; number positive: 0. Pathologic Staging: pT4b, pN0, pMX. Ancillary studies: Mismatch repair protein by immunohistochemistry pending. Comments: There is a colorectal-type adenocarcinoma arising in the cecum in the area of the appendiceal orifice. The tumor extends into the  pericecal connective tissue, and involves the attached segment of terminal ileum. Immunohistochemistry shows the tumor is positive with CDX2 and cytokeratin 20, and negative with cytokeratin 7, estrogen receptor and WT-1 supporting a diagnosis of primary colorectal adenocarcinoma. ADDITIONAL INFORMATION: Mismatch Repair (MMR) Protein Immunohistochemistry (IHC) IHC Expression Result: MLH1: Preserved nuclear expression (greater 50% tumor expression) MSH2: Preserved nuclear expression (greater 50% tumor expression) MSH6: Preserved nuclear expression (greater 50% tumor expression) PMS2: Preserved nuclear expression (greater 50% tumor expression) * Internal control demonstrates intact nuclear expression Interpretation: NORMAL There is preserved expression of the major and minor MMR proteins. There is a very low probability that microsatellite instability (MSI) is present. However, certain clinically significant MMR protein mutations may result in preservation of nuclear expression. It is recommended that the preservation of protein expression be correlated with molecular based MSI testing.    RADIOGRAPHIC STUDIES: I have personally reviewed the radiological images as listed and agreed with the findings in the report.  PET 08/09/2016 IMPRESSION: 1. Overall mild improvement compared to FDG PET scan of 10/28/2015. 2. Pulmonary nodules are increased in size from PET-CT scan and similar size to most recent CT scan. Largest lesion RIGHT lower lobe does have associated faint metabolic activity consistent with pulmonary metastasis. 3. Decrease in metabolic activity of RIGHT hepatic lobe lesion. Potential second lesion LEFT hepatic lobe with slightly lower metabolic activity. 4. Decreased metabolic activity at the enteric colonic anastomosis in the RIGHT lower quadrant. 5. Interval decrease in metabolic activity of peritoneal nodular metastasis. 6. Nodular implant implant along ventral abdominal  wall may be within the musculature, but also improved.  CT CAP w Contrast 05/19/2016 IMPRESSION: 1. Numerous small solid pulmonary nodules, one of which is new, and several of which have mildly increased in size since 10/28/2015, worrisome for mild progression of pulmonary metastases. 2. Solitary right liver lobe metastasis is mildly increased in size since 01/08/2016. 3. Peritoneal metastases have mildly increased in size since 01/08/2016, largest in the right pelvic sidewall. 4. Well-positioned right nephroureteral stent without overt right hydronephrosis. 5. Additional findings include aortic atherosclerosis, 1 vessel coronary atherosclerosis and mild to moderate emphysema.  Ct chest,  Abdomen Pelvis Wo Contrast 01/08/2016  IMPRESSION: 1. 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 2. Hepatic and renal cysts. 3.  Aortoiliac atherosclerotic vascular disease. 4. Lumbar spondylosis and degenerative disc disease with multilevel impingement.  ASSESSMENT & PLAN: 76 y.o. African-American female, with past medical history of moderate dementia, independent with ADLs, hypertension, presents with small bowel obstruction and weight loss.  1. 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 -I personally reviewed her recent PET scan images, unfortunately the scan showed hypermetabolic peritoneal and liver metastases. 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 -She started  Xeloda '1500mg'$  bid, 2 weeks on and one week off. 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 The tumor has MSI stable, she is not a candidate for immunotherapy, although she may be a candidate for clinical trial of immunotherapy plus MEK inhibitor at [facility]. -I previously reviewed her restaging CT scan from 01/08/2016, which showed overall stable disease, no new lesions. We'll continue her chemotherapy. -I previously reviewed the CT from 05/19/2016 with the patient and her daughter in detail. She has had a moderate disease progression in lungs, liver and peritoneum. I recommended her to change treatment. -she is now on second line single agent rinotecan at a reduced dose '150mg'$ /m2, tolerated well, will continue. -The goal of therapy is palliative, mainly to improve her quality of life. -If she develops poor tolerance to chemotherapy, or further disease progression on next scan, I will likely stop chemotherapy and change to palliative care alone, due to her advanced age and comorbidities, especially dementia. - PET Scan image reviewed in person, and I given her a copy of the report. She has partial response to treatment. We will continue irinotecan and Avastin. - Patient is tolerating chemo very well, adequate for treatment today  - follow up in 4 weeks  2. CKD stage III, right hydroureter -Her Cr was around 1 in 08/2013, Cr increased to 2.4 on 01/20/2015 when she was diagnosed with colon cancer due to dehydration, improved some and moved around1.3-1.5 .  -A previous CT scan showed right hydroureter, which probably contributes to her renal insufficiency. -she had ureter stent placement again in 12/2015, 03/2016, and 04/2016 -Patient had a stent replacement on 07/19/2015 - Cr 1.3 today ( 6/8)  3. HTN, dementia -She will continue her medication and follow-up with her primary care  physician. -Her BP has been better controlled lately, we discussed that Avastin can cause hypertension, we'll continue monitoring closely. -Her dementia has been getting worse gradually, which has increased her care buren on her daughter. I previously discussed burn-out prevention with her daughter, she appreciated and has planned to have a care-giver for her mother   51. Nausea and diarrhea -Secondary to chemotherapy, overall very mild and manageable  -She is to take Imodium PRN and her nausea medications.  8. Goal of care discussion  -We previously discussed the incurable nature of her cancer, and the  overall poor prognosis, especially if she has progress on chemo -The patient understands the goal of care is palliative. -Due to her advanced age and comorbidities, especially dementia, I have low threshold to switch her to palliative care alone if she does not tolerate chemo well or dementia gets worse, her daughter is on -board  -I recommend DNR/DNI, pt and her daughter agrees.    Plan -PET scan reviewed in person, good partial response  -Lab reviewed, adequate for treatment, we'll proceed to cycle 6 Irinotecan at '150mg'$ /m2 and Avastin today and continue every 2 weeks -Follow up with APP Lattie Haw in 2 week and me in 4 weeks.   All questions were answered. The patient knows to call the clinic with any problems, questions or concerns.  I spent 25 minutes counseling the patient face to face. The total time spent in the appointment was 30 minutes and more than 50% was on counseling.   [name], MD 08/12/2016    This document serves as a record of services personally performed by [name], MD. It was created on her behalf by Brandt Loosen, a trained medical scribe. The creation of this record is based on the scribe's personal observations and the provider's statements to them. This document has been checked and approved by the attending provider.

## 2016-08-12 ENCOUNTER — Ambulatory Visit (HOSPITAL_BASED_OUTPATIENT_CLINIC_OR_DEPARTMENT_OTHER): Payer: Medicare Other

## 2016-08-12 ENCOUNTER — Telehealth: Payer: Self-pay | Admitting: Hematology

## 2016-08-12 ENCOUNTER — Encounter: Payer: Self-pay | Admitting: Hematology

## 2016-08-12 ENCOUNTER — Ambulatory Visit (HOSPITAL_BASED_OUTPATIENT_CLINIC_OR_DEPARTMENT_OTHER): Payer: Medicare Other | Admitting: Hematology

## 2016-08-12 ENCOUNTER — Other Ambulatory Visit (HOSPITAL_BASED_OUTPATIENT_CLINIC_OR_DEPARTMENT_OTHER): Payer: Medicare Other

## 2016-08-12 VITALS — BP 109/59 | HR 94 | Temp 97.7°F | Resp 18 | Ht 66.0 in | Wt 119.3 lb

## 2016-08-12 DIAGNOSIS — C180 Malignant neoplasm of cecum: Secondary | ICD-10-CM | POA: Diagnosis not present

## 2016-08-12 DIAGNOSIS — F0390 Unspecified dementia without behavioral disturbance: Secondary | ICD-10-CM

## 2016-08-12 DIAGNOSIS — Z79899 Other long term (current) drug therapy: Secondary | ICD-10-CM

## 2016-08-12 DIAGNOSIS — Z5111 Encounter for antineoplastic chemotherapy: Secondary | ICD-10-CM | POA: Diagnosis not present

## 2016-08-12 DIAGNOSIS — I10 Essential (primary) hypertension: Secondary | ICD-10-CM | POA: Diagnosis not present

## 2016-08-12 DIAGNOSIS — Z5112 Encounter for antineoplastic immunotherapy: Secondary | ICD-10-CM | POA: Diagnosis not present

## 2016-08-12 DIAGNOSIS — C786 Secondary malignant neoplasm of retroperitoneum and peritoneum: Secondary | ICD-10-CM

## 2016-08-12 DIAGNOSIS — N183 Chronic kidney disease, stage 3 (moderate): Secondary | ICD-10-CM | POA: Diagnosis not present

## 2016-08-12 DIAGNOSIS — C182 Malignant neoplasm of ascending colon: Secondary | ICD-10-CM

## 2016-08-12 DIAGNOSIS — D638 Anemia in other chronic diseases classified elsewhere: Secondary | ICD-10-CM

## 2016-08-12 LAB — CBC WITH DIFFERENTIAL/PLATELET
BASO%: 0.8 % (ref 0.0–2.0)
BASOS ABS: 0.1 10*3/uL (ref 0.0–0.1)
EOS ABS: 0.2 10*3/uL (ref 0.0–0.5)
EOS%: 3.3 % (ref 0.0–7.0)
HEMATOCRIT: 33.9 % — AB (ref 34.8–46.6)
HEMOGLOBIN: 10.8 g/dL — AB (ref 11.6–15.9)
LYMPH#: 1.4 10*3/uL (ref 0.9–3.3)
LYMPH%: 23.1 % (ref 14.0–49.7)
MCH: 29.3 pg (ref 25.1–34.0)
MCHC: 31.9 g/dL (ref 31.5–36.0)
MCV: 91.9 fL (ref 79.5–101.0)
MONO#: 0.3 10*3/uL (ref 0.1–0.9)
MONO%: 5.4 % (ref 0.0–14.0)
NEUT#: 4.1 10*3/uL (ref 1.5–6.5)
NEUT%: 67.4 % (ref 38.4–76.8)
RBC: 3.69 10*6/uL — ABNORMAL LOW (ref 3.70–5.45)
RDW: 17.9 % — ABNORMAL HIGH (ref 11.2–14.5)
WBC: 6.1 10*3/uL (ref 3.9–10.3)
nRBC: 0 % (ref 0–0)

## 2016-08-12 LAB — COMPREHENSIVE METABOLIC PANEL
ALBUMIN: 3.2 g/dL — AB (ref 3.5–5.0)
ALK PHOS: 77 U/L (ref 40–150)
ALT: 12 U/L (ref 0–55)
ANION GAP: 13 meq/L — AB (ref 3–11)
AST: 15 U/L (ref 5–34)
BUN: 16.2 mg/dL (ref 7.0–26.0)
CALCIUM: 9.6 mg/dL (ref 8.4–10.4)
CO2: 22 mEq/L (ref 22–29)
Chloride: 111 mEq/L — ABNORMAL HIGH (ref 98–109)
Creatinine: 1.3 mg/dL — ABNORMAL HIGH (ref 0.6–1.1)
EGFR: 48 mL/min/{1.73_m2} — ABNORMAL LOW (ref >90)
Glucose: 84 mg/dl (ref 70–140)
POTASSIUM: 4.1 meq/L (ref 3.5–5.1)
Sodium: 146 mEq/L — ABNORMAL HIGH (ref 136–145)
Total Bilirubin: 0.33 mg/dL (ref 0.20–1.20)
Total Protein: 7.5 g/dL (ref 6.4–8.3)

## 2016-08-12 LAB — UA PROTEIN, DIPSTICK - CHCC: Protein, ur: 30 mg/dL

## 2016-08-12 MED ORDER — PALONOSETRON HCL INJECTION 0.25 MG/5ML
INTRAVENOUS | Status: AC
  Filled 2016-08-12: qty 5

## 2016-08-12 MED ORDER — BEVACIZUMAB CHEMO INJECTION 400 MG/16ML
5.0000 mg/kg | Freq: Once | INTRAVENOUS | Status: AC
  Administered 2016-08-12: 300 mg via INTRAVENOUS
  Filled 2016-08-12: qty 12

## 2016-08-12 MED ORDER — DEXAMETHASONE SODIUM PHOSPHATE 10 MG/ML IJ SOLN
10.0000 mg | Freq: Once | INTRAMUSCULAR | Status: AC
  Administered 2016-08-12: 10 mg via INTRAVENOUS

## 2016-08-12 MED ORDER — PALONOSETRON HCL INJECTION 0.25 MG/5ML
0.2500 mg | Freq: Once | INTRAVENOUS | Status: AC
  Administered 2016-08-12: 0.25 mg via INTRAVENOUS

## 2016-08-12 MED ORDER — IRINOTECAN HCL CHEMO INJECTION 100 MG/5ML
150.0000 mg/m2 | Freq: Once | INTRAVENOUS | Status: AC
  Administered 2016-08-12: 240 mg via INTRAVENOUS
  Filled 2016-08-12: qty 10

## 2016-08-12 MED ORDER — ATROPINE SULFATE 1 MG/ML IJ SOLN
INTRAMUSCULAR | Status: AC
  Filled 2016-08-12: qty 1

## 2016-08-12 MED ORDER — ATROPINE SULFATE 1 MG/ML IJ SOLN
0.5000 mg | Freq: Once | INTRAMUSCULAR | Status: AC | PRN
  Administered 2016-08-12: 0.5 mg via INTRAVENOUS

## 2016-08-12 MED ORDER — DEXAMETHASONE SODIUM PHOSPHATE 10 MG/ML IJ SOLN
INTRAMUSCULAR | Status: AC
Start: 2016-08-12 — End: 2016-08-12
  Filled 2016-08-12: qty 1

## 2016-08-12 MED ORDER — SODIUM CHLORIDE 0.9 % IV SOLN
Freq: Once | INTRAVENOUS | Status: AC
  Administered 2016-08-12: 10:00:00 via INTRAVENOUS

## 2016-08-12 NOTE — Addendum Note (Signed)
Addended by: [name] on: 08/12/2016 09:49 AM   Modules accepted: Orders

## 2016-08-12 NOTE — Patient Instructions (Signed)
[facility] Discharge Instructions for Patients Receiving Chemotherapy  Today you received the following chemotherapy agents:  Avastin, Irinotecan  To help prevent nausea and vomiting after your treatment, we encourage you to take your nausea medication as prescribed.   If you develop nausea and vomiting that is not controlled by your nausea medication, call the clinic.   BELOW ARE SYMPTOMS THAT SHOULD BE REPORTED IMMEDIATELY:  *FEVER GREATER THAN 100.5 F  *CHILLS WITH OR WITHOUT FEVER  NAUSEA AND VOMITING THAT IS NOT CONTROLLED WITH YOUR NAUSEA MEDICATION  *UNUSUAL SHORTNESS OF BREATH  *UNUSUAL BRUISING OR BLEEDING  TENDERNESS IN MOUTH AND THROAT WITH OR WITHOUT PRESENCE OF ULCERS  *URINARY PROBLEMS  *BOWEL PROBLEMS  UNUSUAL RASH Items with * indicate a potential emergency and should be followed up as soon as possible.  Feel free to call the clinic you have any questions or concerns. The clinic phone number is [phone].  Please show the [place] at check-in to the Emergency Department and triage nurse.

## 2016-08-12 NOTE — Telephone Encounter (Signed)
Gave patient AVS and calender per 6/8 los .

## 2016-08-16 MED FILL — URO-MP CAPSULE: 118 | 15 days supply | Qty: 30 | Fill #1

## 2016-08-19 MED FILL — MYRBETRIQ ER 25 MG TABLET: 25 | 30 days supply | Qty: 30 | Fill #1

## 2016-08-23 MED FILL — MIRTAZAPINE 15 MG TABLET: 15 | 30 days supply | Qty: 30 | Fill #3

## 2016-08-26 ENCOUNTER — Ambulatory Visit (HOSPITAL_BASED_OUTPATIENT_CLINIC_OR_DEPARTMENT_OTHER): Payer: Medicare Other

## 2016-08-26 ENCOUNTER — Ambulatory Visit (HOSPITAL_BASED_OUTPATIENT_CLINIC_OR_DEPARTMENT_OTHER): Payer: Medicare Other | Admitting: Nurse Practitioner

## 2016-08-26 ENCOUNTER — Other Ambulatory Visit (HOSPITAL_BASED_OUTPATIENT_CLINIC_OR_DEPARTMENT_OTHER): Payer: Medicare Other | Admitting: Lab

## 2016-08-26 VITALS — BP 147/90 | HR 97 | Temp 97.8°F | Resp 17 | Ht 66.0 in | Wt 121.8 lb

## 2016-08-26 VITALS — BP 120/73

## 2016-08-26 DIAGNOSIS — N183 Chronic kidney disease, stage 3 (moderate): Secondary | ICD-10-CM | POA: Diagnosis not present

## 2016-08-26 DIAGNOSIS — C180 Malignant neoplasm of cecum: Secondary | ICD-10-CM

## 2016-08-26 DIAGNOSIS — I10 Essential (primary) hypertension: Secondary | ICD-10-CM | POA: Diagnosis not present

## 2016-08-26 DIAGNOSIS — Z5111 Encounter for antineoplastic chemotherapy: Secondary | ICD-10-CM

## 2016-08-26 DIAGNOSIS — Z5112 Encounter for antineoplastic immunotherapy: Secondary | ICD-10-CM

## 2016-08-26 DIAGNOSIS — C182 Malignant neoplasm of ascending colon: Secondary | ICD-10-CM

## 2016-08-26 LAB — CBC WITH DIFFERENTIAL/PLATELET
BASO%: 0.3 % (ref 0.0–2.0)
Basophils Absolute: 0 10e3/uL (ref 0.0–0.1)
EOS%: 2.6 % (ref 0.0–7.0)
Eosinophils Absolute: 0.2 10e3/uL (ref 0.0–0.5)
HCT: 33.9 % — ABNORMAL LOW (ref 34.8–46.6)
HGB: 10.8 g/dL — ABNORMAL LOW (ref 11.6–15.9)
LYMPH%: 18.1 % (ref 14.0–49.7)
MCH: 29.3 pg (ref 25.1–34.0)
MCHC: 31.9 g/dL (ref 31.5–36.0)
MCV: 91.9 fL (ref 79.5–101.0)
MONO#: 0.5 10e3/uL (ref 0.1–0.9)
MONO%: 4.9 % (ref 0.0–14.0)
NEUT#: 6.9 10e3/uL — ABNORMAL HIGH (ref 1.5–6.5)
NEUT%: 74.1 % (ref 38.4–76.8)
Platelets: 362 10e3/uL (ref 145–400)
RBC: 3.69 10e6/uL — ABNORMAL LOW (ref 3.70–5.45)
RDW: 18.9 % — ABNORMAL HIGH (ref 11.2–14.5)
WBC: 9.3 10e3/uL (ref 3.9–10.3)
lymph#: 1.7 10e3/uL (ref 0.9–3.3)

## 2016-08-26 LAB — COMPREHENSIVE METABOLIC PANEL WITH GFR
ALT: 12 U/L (ref 0–55)
AST: 18 U/L (ref 5–34)
Albumin: 3.6 g/dL (ref 3.5–5.0)
Alkaline Phosphatase: 81 U/L (ref 40–150)
Anion Gap: 14 meq/L — ABNORMAL HIGH (ref 3–11)
BUN: 16.5 mg/dL (ref 7.0–26.0)
CO2: 23 meq/L (ref 22–29)
Calcium: 9.9 mg/dL (ref 8.4–10.4)
Chloride: 106 meq/L (ref 98–109)
Creatinine: 1.3 mg/dL — ABNORMAL HIGH (ref 0.6–1.1)
EGFR: 48 ml/min/1.73 m2 — ABNORMAL LOW
Glucose: 79 mg/dL (ref 70–140)
Potassium: 4 meq/L (ref 3.5–5.1)
Sodium: 142 meq/L (ref 136–145)
Total Bilirubin: 0.55 mg/dL (ref 0.20–1.20)
Total Protein: 8 g/dL (ref 6.4–8.3)

## 2016-08-26 MED ORDER — PALONOSETRON HCL INJECTION 0.25 MG/5ML
0.2500 mg | Freq: Once | INTRAVENOUS | Status: AC
  Administered 2016-08-26: 0.25 mg via INTRAVENOUS

## 2016-08-26 MED ORDER — DEXAMETHASONE SODIUM PHOSPHATE 10 MG/ML IJ SOLN
10.0000 mg | Freq: Once | INTRAMUSCULAR | Status: AC
  Administered 2016-08-26: 10 mg via INTRAVENOUS

## 2016-08-26 MED ORDER — ATROPINE SULFATE 1 MG/ML IJ SOLN
INTRAMUSCULAR | Status: AC
  Filled 2016-08-26: qty 1

## 2016-08-26 MED ORDER — SODIUM CHLORIDE 0.9% FLUSH
10.0000 mL | INTRAVENOUS | Status: DC | PRN
  Filled 2016-08-26: qty 10

## 2016-08-26 MED ORDER — SODIUM CHLORIDE 0.9 % IV SOLN: Freq: Once | INTRAVENOUS | Status: DC

## 2016-08-26 MED ORDER — ATROPINE SULFATE 1 MG/ML IJ SOLN
0.5000 mg | Freq: Once | INTRAMUSCULAR | Status: AC | PRN
  Administered 2016-08-26: 0.5 mg via INTRAVENOUS

## 2016-08-26 MED ORDER — DEXAMETHASONE SODIUM PHOSPHATE 10 MG/ML IJ SOLN
INTRAMUSCULAR | Status: AC
  Filled 2016-08-26: qty 1

## 2016-08-26 MED ORDER — IRINOTECAN HCL CHEMO INJECTION 100 MG/5ML
150.0000 mg/m2 | Freq: Once | INTRAVENOUS | Status: AC
  Administered 2016-08-26: 240 mg via INTRAVENOUS
  Filled 2016-08-26: qty 10

## 2016-08-26 MED ORDER — PALONOSETRON HCL INJECTION 0.25 MG/5ML
INTRAVENOUS | Status: AC
  Filled 2016-08-26: qty 5

## 2016-08-26 MED ORDER — SODIUM CHLORIDE 0.9 % IV SOLN
Freq: Once | INTRAVENOUS | Status: AC
  Administered 2016-08-26: 13:00:00 via INTRAVENOUS

## 2016-08-26 MED ORDER — SODIUM CHLORIDE 0.9 % IV SOLN
5.0000 mg/kg | Freq: Once | INTRAVENOUS | Status: AC
  Administered 2016-08-26: 300 mg via INTRAVENOUS
  Filled 2016-08-26: qty 12

## 2016-08-26 NOTE — Progress Notes (Signed)
  [facility] OFFICE PROGRESS NOTE   Diagnosis:  Metastatic colon cancer  INTERVAL HISTORY:   Beth Wilkins returns as scheduled. She completed cycle 6 irinotecan/Avastin 08/12/2016. She is accompanied by her daughter. They report overall she is doing well. No nausea or vomiting. No mouth sores. No diarrhea. No shortness of breath or chest pain. No leg swelling or calf pain. No bleeding.  Objective:  Vital signs in last 24 hours:  Blood pressure (!) 147/90, pulse 97, temperature 97.8 F (36.6 C), temperature source Oral, resp. rate 17, height '5\' 6"'$  (1.676 m), weight 121 lb 12.8 oz (55.2 kg), SpO2 100 %.    HEENT: No thrush or ulcers. Resp: Lungs clear bilaterally. Cardio: Regular rate and rhythm. GI: Abdomen soft and nontender. No hepatomegaly. Vascular: No leg edema. Calves soft and nontender. Neuro: Alert. Follows commands.  Skin: No rash.    Lab Results:  Lab Results  Component Value Date   WBC 6.1 08/12/2016   HGB 10.8 (L) 08/12/2016   HCT 33.9 (L) 08/12/2016   MCV 91.9 08/12/2016   PLT 480 few platelet clumps  (H) 08/12/2016   NEUTROABS 4.1 08/12/2016    Imaging:  No results found.  Medications: I have reviewed the patient's current medications.  Assessment/Plan: 1. 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 Restaging PET scan 08/09/2016 showed some improvement. She has completed 6 cycles of Avastin/irinotecan. 2. Chronic kidney disease stage III, right hydroureter; ureter stent placement October 2017, January 2018 and February 2018.  3. Hypertension, dementia 4. Hyperbilirubinemia.  5. Goals of care discussion. Dr. Burr Medico has previously discussed the incurable nature of her cancer and the overall poor prognosis especially if she progresses on chemotherapy. She  understands the goal of care is palliative.   Disposition: Beth Wilkins appears stable. She has completed 6 cycles of irinotecan/Avastin. She is tolerating treatment well. The plan is to proceed with cycle 7 today as scheduled pending adequate labs. She is returning to the lab now.  She is scheduled to return for follow-up and cycle 8 in 2 weeks. She will contact the office in the interim with any problems.    Ned Card ANP/GNP-BC   08/26/2016  11:05 AM

## 2016-08-26 NOTE — Patient Instructions (Signed)
[facility] Discharge Instructions for Patients Receiving Chemotherapy  Today you received the following chemotherapy agents:  Irinotecan and Avastin.  To help prevent nausea and vomiting after your treatment, we encourage you to take your nausea medication as directed.   If you develop nausea and vomiting that is not controlled by your nausea medication, call the clinic.   BELOW ARE SYMPTOMS THAT SHOULD BE REPORTED IMMEDIATELY:  *FEVER GREATER THAN 100.5 F  *CHILLS WITH OR WITHOUT FEVER  NAUSEA AND VOMITING THAT IS NOT CONTROLLED WITH YOUR NAUSEA MEDICATION  *UNUSUAL SHORTNESS OF BREATH  *UNUSUAL BRUISING OR BLEEDING  TENDERNESS IN MOUTH AND THROAT WITH OR WITHOUT PRESENCE OF ULCERS  *URINARY PROBLEMS  *BOWEL PROBLEMS  UNUSUAL RASH Items with * indicate a potential emergency and should be followed up as soon as possible.  Feel free to call the clinic you have any questions or concerns. The clinic phone number is [phone].  Please show the [place] at check-in to the Emergency Department and triage nurse.

## 2016-09-02 MED FILL — AMLODIPINE BESYLATE 5 MG TA: 5 | 30 days supply | Qty: 30 | Fill #1

## 2016-09-06 NOTE — Progress Notes (Signed)
Beth Wilkins  [phone]) [phone] [phone]  Clinic Follow Up Note   Patient Care Team: [name], MD as PCP - General (Family Medicine) [name], MD as Consulting Physician (Medical Oncology) [name], MD as Consulting Physician (General Surgery) [name], MD as Consulting Physician (Orthopedic Surgery) [name] [name], MD as Consulting Physician (Urology) 09/09/2016     CHIEF COMPLAINTS:  Follow up metastatic colon cancer  Oncology History   Cancer of right colon [facility])   Staging form: Colon and Rectum, AJCC 7th Edition     Pathologic stage from 01/23/2015: Stage IIC (T4b, N0, cM0) - Signed by [name], MD on 02/05/2015       Cancer of right colon ([place])   01/21/2015 Imaging     CT abdomen and pelvis showed high-grade small bowel obstruction,  right I [place] ureteral nephrosis,  multiple liver cysts.  CT chest was negative for metastatic lesion.      01/23/2015 Pathology Results     invasive adenocarcinoma extending into the pericecal connective tissue,  grade 2, LVI (-),  no perineural invasion, 3 lymph nodes were negative.      01/23/2015 Surgery      terminal ileum and cecum seegmental resection with anastomosis by Dr. Hassell Done      01/23/2015 Initial Diagnosis    Cancer of right colon ([place])      03/13/2015 - 07/06/2015 Chemotherapy    Xeloda 1500 mg bid X 14 days on-7 off, s/p 5 cycles, stopped early due to wrosening renal function      10/28/2015 Progression    PET scan showed multiple hypermetabolic peritoneal nodules, right ovarian cystic mass, and hypermetabolic liver lesion, highly suspicious for metastatic colon cancer. Small pulmonary nodules are indeterminate.       11/11/2015 - 05/20/2016 Chemotherapy    Xeloda '1500mg'$  bid, 2 weeks on and one week off, Avastin was added on from cycle 3. Xeloda held from 05/20/16 due to progression on CT from 05/19/16 and total bilirubin increased to 2.6.      01/08/2016 Imaging   Ct chest, Abdomen Pelvis Wo Contrast 01/08/2016  IMPRESSION: 1. 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 2. Hepatic and renal cysts. 3.  Aortoiliac atherosclerotic vascular disease. 4. Lumbar spondylosis and degenerative disc disease with multilevel Impingement.      05/19/2016 Imaging    CT CAP w Contrast IMPRESSION: 1. Numerous small solid pulmonary nodules, one of which is new, and several of which have mildly increased in size since 10/28/2015, worrisome for mild progression of pulmonary metastases. 2. Solitary right liver lobe metastasis is mildly increased in size since 01/08/2016. 3. Peritoneal metastases have mildly increased in size since 01/08/2016, largest in the right pelvic sidewall. 4. Well-positioned right nephroureteral stent without overt right hydronephrosis. 5. Additional findings include aortic atherosclerosis, 1 vessel coronary atherosclerosis and mild to moderate emphysema.      06/03/2016 -  Chemotherapy    Irinotecan and Avastin every 2 weeks starting 06/03/16      07/29/2016 Tumor Marker    CEA 6.51       08/09/2016 Imaging    PET  IMPRESSION: 1. Overall mild improvement compared to FDG PET scan of 10/28/2015. 2. Pulmonary nodules are increased in size from PET-CT scan and similar size to most recent CT scan. Largest lesion RIGHT lower lobe does have associated faint metabolic activity consistent with pulmonary metastasis. 3. Decrease in metabolic activity of RIGHT hepatic lobe lesion. Potential second  lesion LEFT hepatic lobe with slightly lower metabolic activity. 4. Decreased metabolic activity at the enteric colonic anastomosis in the RIGHT lower quadrant. 5. Interval decrease in metabolic activity of peritoneal nodular metastasis. 6. Nodular implant implant along ventral abdominal wall may be within the musculature, but also improved.       HISTORY OF PRESENTING ILLNESS  (02/05/2015):  Beth Wilkins 76 y.o. female  with past medical history of moderate dementia, hypertension, is here because of recently diagnosed stage II colon cancer. She is accompanied to the clinic by her daughter. History is mainly obtained from the chart and her daughter.  She has had abdominal discomfort, nausea and vomiting, and norexia for  the past to 3 months along with 15 pounds weight loss, . Her nausea was mild and intermittent, she does not seek medical attention initially. She presented with  worsening symptoms for 2-3 weeks, and episodes of projectile vomiting and dehydration to emergency room, was admitted to [facility] on 01/20/2015. CT scan reviewed small bowel obstruction, and possible appendiceal rupture. She was brought to OR on 01/23/2015, underwent right hemicolectomy with anastomosis by Dr. Daphine Deutscher. Surgical path reviewed adenocarcinoma at the cecum. She was discharged home on 01/26/2015.  She has recovered well, eating much better than prior to surgery, move around without restriction. No pain, she has loose BM 3 times a day, and she has backed off her mirealax.  she lives with her daughter. She states her appetite is moderate, sometime low, and she refuses to eat if she doesn't have appetite. No nausea, vomiting, abdominal bloating since her surgery.  Her last colonoscopy was 6-8 years ago, and her stool OB was negative this year.   CURRENT THERAPY: Irinotecan and Avastin every 2 weeks started 06/03/16  INTERIM HISTORY:  Beth Wilkins presents to the clinic today for follow up and cycle treatment. She presents to the clinic today with her daughter. She reports to doing well at home. She eats well. And she denies pain. Her daughter says her abdominal discomfort was resolved with hot prune juice. She says Dr. Ronne Binning and her stent removed and will get another that will last for 6 months.  Her daughter says the pt drinks LeBlue water. She will eat cheeseburgers and steak and cheese  sandwiches is what she will eat.  Last chemo cycle did not make her sick according to daughter.  Daughter would like to go to [facility] with mother for her chemo. She will let us know when that is.    MEDICAL HISTORY:  Past Medical History:  Diagnosis Date  . Anxiety   . Arthritis   . Bilateral renal cysts   . Centrilobular emphysema (HCC)   . Chronic low back pain   . CKD (chronic kidney disease), stage III   . Colon cancer [facility]) oncologist-  dr Malachy Mood   dx 01-23-2015  right colon Invasive adenocarcinoma into pericecal connective tissue and involving small intestine , Stage IIc, Grade 2, LVI(-),  (pT4b N0 M0) /  s/p resection termial ileum and cecum and chemotherapy 01-06-217 to 07-06-2015/   08/ 2017  per PET  recurrent w/ liver mets and peritoneal carcinomatosis  . Complication of anesthesia    can be combative due to dementia  . Full dentures   . History of gastroesophageal reflux (GERD)    changes diet  . History of pelvic fracture   . Hyperlipidemia   . Hypertension   . Liver metastasis (HCC)   . Moderate dementia without behavioral disturbance  moderate to severe dementia per neurologist note (dr Krista Blue) in epic  . Pulmonary nodules   . Right ovarian cyst   . Ureteral obstruction, right urologist-  dr [name]   malignant obstruction right ureter w/ colon cancer -- treated with ureteral stent placement    SURGICAL HISTORY: Past Surgical History:  Procedure Laterality Date  . CATARACT EXTRACTION W/ INTRAOCULAR LENS  IMPLANT, BILATERAL  2005 approx  . CYSTOSCOPY W/ URETERAL STENT PLACEMENT Right 12/03/2015   Procedure: CYSTOSCOPY WITH RETROGRADE PYELOGRAM/URETERAL STENT PLACEMENT;  Surgeon: Cleon Gustin, MD;  Location: [facility];  Service: Urology;  Laterality: Right;  . CYSTOSCOPY W/ URETERAL STENT PLACEMENT Right 02/15/2016   Procedure: CYSTOSCOPY WITH RETROGRADE PYELOGRAM/URETERAL STENT REPLACEMENT;  Surgeon: Cleon Gustin, MD;  Location:  [facility];  Service: Urology;  Laterality: Right;  . CYSTOSCOPY W/ URETERAL STENT PLACEMENT Right 03/28/2016   Procedure: CYSTOSCOPY WITH RETROGRADE PYELOGRAM/URETERAL STENT EXCHANGE;  Surgeon: Cleon Gustin, MD;  Location: [facility];  Service: Urology;  Laterality: Right;  . CYSTOSCOPY W/ URETERAL STENT PLACEMENT Right 07/18/2016   Procedure: CYSTOSCOPY WITH RETROGRADE PYELOGRAM/ STENT EXCHANGE;  Surgeon: Cleon Gustin, MD;  Location: [facility];  Service: Urology;  Laterality: Right;  . CYSTOSCOPY WITH RETROGRADE PYELOGRAM, URETEROSCOPY AND STENT PLACEMENT Right 09/21/2015   Procedure: CYSTOSCOPY WITH BIL RETROGRADE PYELOGRAM, URETEROSCOPY, RIGHT STENT PLACEMENT ;  Surgeon: Cleon Gustin, MD;  Location[facility];  Service: Urology;  Laterality: Right;  . LAPAROSCOPY N/A 01/23/2015   Procedure: DIAGNOSTIC LAPAROSCOPY, EXPLORATORY LAPAROTOMY  RIGHT HEMI-COLECTOMY FOR OBSTRUCTION OF RIGHT TERMINAL ILEUM;  Surgeon: [name], MD;  Location: [facility];  Service: General;  Laterality: N/A;    SOCIAL HISTORY: Social History   Social History  . Marital status: Single    Spouse name: N/A  . Number of children: N/A  . Years of education: N/A   Occupational History  . Not on file.   Social History Main Topics  . Smoking status: Former Smoker    Packs/day: 0.25    Years: 50.00    Types: Cigarettes    Quit date: 01/25/2015  . Smokeless tobacco: Never Used  . Alcohol use 1.8 oz/week    2 Glasses of wine, 1 Cans of beer per week     Comment: occasional   . Drug use: No  . Sexual activity: Not on file   Other Topics Concern  . Not on file   Social History Narrative   Legally separated   Lives w/daughter, Renly Guedes   Has total of #2 daughters and #1 son   Dementia   Fall Risk-ambulates w/cane    FAMILY HISTORY: Family History  Problem Relation Age of Onset  . Diabetes Mellitus II Father   .  Cancer Mother 48       ovarian cancer   . Cervical cancer Other     ALLERGIES:  is allergic to namenda [memantine hcl] and oxycodone.  MEDICATIONS:  Current Outpatient Prescriptions  Medication Sig Dispense Refill  . amLODipine (NORVASC) 5 MG tablet Take 5 mg by mouth every morning.     . Cholecalciferol (VITAMIN D3) 2000 UNITS TABS Take by mouth daily.    . Coenzyme Q10 (CO Q 10) 100 MG CAPS Take by mouth daily.    Marland Kitchen donepezil (ARICEPT) 10 MG tablet TAKE 1 TABLET BY MOUTH EVERY DAY 30 tablet 11  . Meth-Hyo-M Bl-Na Phos-Ph Sal (URIBEL) 118 MG CAPS Take 1 capsule (  118 mg total) by mouth 2 (two) times daily as needed. 30 capsule 3  . mirabegron ER (MYRBETRIQ) 50 MG TB24 tablet Take 50 mg by mouth daily.    . mirtazapine (REMERON) 15 MG tablet Take 1 tablet (15 mg total) by mouth at bedtime. 30 tablet 3  . Multiple Vitamins-Minerals (OCUVITE-LUTEIN PO) Take 1 tablet by mouth daily.    . Multiple Vitamins-Minerals (WOMENS MULTIVITAMIN PO) Take 1 tablet by mouth daily.    . polyethylene glycol (MIRALAX / GLYCOLAX) packet Take 17 g by mouth daily. (Patient taking differently: Take 17 g by mouth daily as needed. ) 14 each 0  . prochlorperazine (COMPAZINE) 5 MG tablet Take 1 tablet (5 mg total) by mouth every 6 (six) hours as needed for nausea or vomiting. 30 tablet 0  . UNABLE TO FIND Med Name: Closys Mouth Rinse two-three times daily for ulcer.    . vitamin B-12 (CYANOCOBALAMIN) 1000 MCG tablet Take 1,000 mcg by mouth daily.     No current facility-administered medications for this visit.     REVIEW OF SYSTEMS:  Constitutional: Denies fevers, chills or abnormal night sweats (+) fatigue (+) gained weight purposely (+) improved appetite Eyes: Denies blurriness of vision, double vision or watery eyes  Ears, nose, mouth, throat, and face: Denies mucositis or sore throat Respiratory: Denies cough, dyspnea or wheezes Cardiovascular: Denies palpitation, chest discomfort or lower extremity  swelling Gastrointestinal:  Denies heartburn  Skin: Denies abnormal skin rashes Lymphatics: Denies new lymphadenopathy or easy bruising Neurological:Denies numbness, tingling or new weaknesses Behavioral/Psych: Mood is stable, no new changes (+) Dementia All other systems were reviewed with the patient and are negative.  PHYSICAL EXAMINATION:  ECOG PERFORMANCE STATUS: 1 - Symptomatic but completely ambulatory  Vitals:   09/09/16 0834  BP: (!) 158/79  Pulse: 99  Resp: 17  Temp: 98.5 F (36.9 C)   Filed Weights   09/09/16 0834  Weight: 122 lb 4.8 oz (55.5 kg)     GENERAL:alert, no distress and comfortable SKIN: skin color, texture, turgor are normal, no rashes or significant lesions EYES: normal, conjunctiva are pink and non-injected, sclera clear (+) difficult to evaluate due to her dementia OROPHARYNX:no exudate and tongue normal (+) Upper and lower dentures  NECK: supple, thyroid normal size, non-tender, without nodularity LYMPH:  no palpable lymphadenopathy in the cervical, axillary or inguinal LUNGS: clear to auscultation and percussion with normal breathing effort HEART: regular rate & rhythm and no murmurs and no lower extremity edema ABDOMEN:abdomen soft, non-tender and normal bowel sounds  Musculoskeletal:no cyanosis of digits and no clubbing  PSYCH: alert & oriented x 3 with fluent speech (+) dementia  NEURO: no focal motor/sensory deficits  LABORATORY DATA:  I have reviewed the data as listed CBC Latest Ref Rng & Units 09/09/2016 08/26/2016 08/12/2016  WBC 3.9 - 10.3 10e3/uL 6.7 9.3 6.1  Hemoglobin 11.6 - 15.9 g/dL 11.4(L) 10.8(L) 10.8(L)  Hematocrit 34.8 - 46.6 % 36.3 33.9(L) 33.9(L)  Platelets 145 - 400 10e3/uL 414(H) 362 480 few platelet clumps(H)    CMP Latest Ref Rng & Units 09/09/2016 08/26/2016 08/12/2016  Glucose 70 - 140 mg/dl 72 79 84  BUN 7.0 - 26.0 mg/dL 15.1 16.5 16.2  Creatinine 0.6 - 1.1 mg/dL 1.2(H) 1.3(H) 1.3(H)  Sodium 136 - 145 mEq/L 143 142 146(H)   Potassium 3.5 - 5.1 mEq/L 4.0 4.0 4.1  Chloride 101 - 111 mmol/L - - -  CO2 22 - 29 mEq/L '22 23 22  '$ Calcium 8.4 - 10.4  mg/dL 9.7 9.9 9.6  Total Protein 6.4 - 8.3 g/dL 8.0 8.0 7.5  Total Bilirubin 0.20 - 1.20 mg/dL 0.37 0.55 0.33  Alkaline Phos 40 - 150 U/L 84 81 77  AST 5 - 34 U/L '18 18 15  '$ ALT 0 - 55 U/L '13 12 12    '$ CEA  01/23/2015: 5.7 06/05/2015: 6.1 08/04/2015: 10.3 10/12/2015: 13.29 (in-house)  11/04/2015: 12.83 01/13/2016: 8.57 02/24/2016: 8.41 04/27/2016: 9.22 05/20/2016: 8.32 06/17/2016: 6.45 07/15/2016: 5.07 07/29/2016: 6.51 09/09/16: PENDING  PATHOLOGY REPORT: Diagnosis 01/23/2015 Colon, segmental resection, with terminal ileum and cecum - INVASIVE ADENOCARCINOMA EXTENDING INTO PERICECAL CONNECTIVE TISSUE AND INVOLVING SMALL INTESTINE. - THREE BENIGN LYMPH NODES (0/3). - SEE ONCOLOGY TABLE BELOW.  Microscopic Comment COLON AND RECTUM (INCLUDING TRANS-ANAL RESECTION): Specimen: Terminal ileum and cecum. Procedure: Resection. Tumor site: Cecum adjacent to appendix. Specimen integrity: Intact. Macroscopic intactness of mesorectum: Not applicable. Macroscopic tumor perforation: No. Invasive tumor: Maximum size: 4.0 cm. Histologic type(s): Colorectal adenocarcinoma. Histologic grade and differentiation: G2: moderately differentiated/low grade. Type of polyp in which invasive carcinoma arose: No residual polyp. Microscopic extension of invasive tumor: Into pericecal connective tissue and terminal ileum. Lymph-Vascular invasion: Not identified. Peri-neural invasion: Not identified. Tumor deposit(s) (discontinuous extramural extension): No. Resection margins: Proximal margin: Free of tumor. Distal margin: Free of tumor. Circumferential (radial) (posterior ascending, posterior descending; lateral and posterior mid-rectum; and entire lower 1/3 rectum): Free of tumor. Mesenteric margin (sigmoid and transverse): N/A. Distance closest margin (if all above margins negative):  N/A. Treatment effect (neo-adjuvant therapy): No. Additional polyp(s): No. Non-neoplastic findings: N/A. Lymph nodes: number examined 3; number positive: 0. Pathologic Staging: pT4b, pN0, pMX. Ancillary studies: Mismatch repair protein by immunohistochemistry pending. Comments: There is a colorectal-type adenocarcinoma arising in the cecum in the area of the appendiceal orifice. The tumor extends into the pericecal connective tissue, and involves the attached segment of terminal ileum. Immunohistochemistry shows the tumor is positive with CDX2 and cytokeratin 20, and negative with cytokeratin 7, estrogen receptor and WT-1 supporting a diagnosis of primary colorectal adenocarcinoma. ADDITIONAL INFORMATION: Mismatch Repair (MMR) Protein Immunohistochemistry (IHC) IHC Expression Result: MLH1: Preserved nuclear expression (greater 50% tumor expression) MSH2: Preserved nuclear expression (greater 50% tumor expression) MSH6: Preserved nuclear expression (greater 50% tumor expression) PMS2: Preserved nuclear expression (greater 50% tumor expression) * Internal control demonstrates intact nuclear expression Interpretation: NORMAL There is preserved expression of the major and minor MMR proteins. There is a very low probability that microsatellite instability (MSI) is present. However, certain clinically significant MMR protein mutations may result in preservation of nuclear expression. It is recommended that the preservation of protein expression be correlated with molecular based MSI testing.    RADIOGRAPHIC STUDIES: I have personally reviewed the radiological images as listed and agreed with the findings in the report.  PET 08/09/2016 IMPRESSION: 1. Overall mild improvement compared to FDG PET scan of 10/28/2015. 2. Pulmonary nodules are increased in size from PET-CT scan and similar size to most recent CT scan. Largest lesion RIGHT lower lobe does have associated faint metabolic activity  consistent with pulmonary metastasis. 3. Decrease in metabolic activity of RIGHT hepatic lobe lesion. Potential second lesion LEFT hepatic lobe with slightly lower metabolic activity. 4. Decreased metabolic activity at the enteric colonic anastomosis in the RIGHT lower quadrant. 5. Interval decrease in metabolic activity of peritoneal nodular metastasis. 6. Nodular implant implant along ventral abdominal wall may be within the musculature, but also improved.  CT CAP w Contrast 05/19/2016 IMPRESSION: 1. Numerous small solid pulmonary nodules, one  of which is new, and several of which have mildly increased in size since 10/28/2015, worrisome for mild progression of pulmonary metastases. 2. Solitary right liver lobe metastasis is mildly increased in size since 01/08/2016. 3. Peritoneal metastases have mildly increased in size since 01/08/2016, largest in the right pelvic sidewall. 4. Well-positioned right nephroureteral stent without overt right hydronephrosis. 5. Additional findings include aortic atherosclerosis, 1 vessel coronary atherosclerosis and mild to moderate emphysema.  Ct chest, Abdomen Pelvis Wo Contrast 01/08/2016  IMPRESSION: 1. 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 2. Hepatic and renal cysts. 3.  Aortoiliac atherosclerotic vascular disease. 4. Lumbar spondylosis and degenerative disc disease with multilevel impingement.  ASSESSMENT & PLAN: 76 y.o. African-American female, with past medical history of moderate dementia, independent with ADLs, hypertension, presents with small bowel obstruction and weight loss.  1. 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 -I personally reviewed her recent PET scan images, unfortunately the scan showed hypermetabolic peritoneal and liver metastases. 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 -She started Xeloda '1500mg'$  bid, 2 weeks on and one week off. 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 The tumor has MSI stable, she is not a candidate for immunotherapy, although she may be a candidate for clinical trial of immunotherapy plus MEK inhibitor at [facility]. -I previously reviewed her restaging CT scan from 01/08/2016, which showed overall stable disease, no new lesions. We'll continue her chemotherapy. -I previously reviewed the CT from 05/19/2016 with the patient and her daughter in detail. She has had a moderate disease progression in lungs, liver and peritoneum. I recommended her to change treatment. -she is now on second line single agent rinotecan at a reduced dose '150mg'$ /m2, tolerated well, will continue. -The goal of therapy is palliative, mainly to improve her quality of life. -If she develops poor tolerance to chemotherapy, or further disease progression on next scan, I will likely stop chemotherapy and change to palliative care alone, due to her advanced age and comorbidities, especially dementia. - PET Scan image from 08/09/16 reviewed in person, and I gave her a copy of the report. She has good partial response to treatment. We will continue irinotecan and Avastin. -We discussed Pt being able to take chemo break and she will notify us if needed  -Labs reviewed and tolerating chemo well, adequate for treatment today and will continue every 2 weeks - follow up in 4  weeks   2. CKD stage III, right hydroureter -Her Cr was around 1 in 08/2013, Cr increased to 2.4 on 01/20/2015 when she was diagnosed with colon cancer due to dehydration, improved some and moved around1.3-1.5 .  -A previous CT scan showed right hydroureter, which probably contributes to her renal insufficiency. -she had ureter stent placement again in 12/2015, 03/2016, and 04/2016 -Patient had a stent replacement on 07/19/2015 - Cr has improved some lately  3. HTN, dementia -She will continue her medication and  follow-up with her primary care physician. -Her BP has been better controlled lately, we discussed that Avastin can cause hypertension, we'll continue monitoring closely. -Her dementia has been getting worse gradually, which has increased her care buren on her daughter. I previously discussed burn-out prevention with her daughter, she appreciated and has planned to have a care-giver for her mother   46. Nausea and diarrhea -Secondary to chemotherapy, overall very mild and manageable  -She is to take Imodium PRN and her nausea medications.  8. Goal of care discussion  -We previously discussed the incurable nature of her cancer, and the overall poor prognosis, especially if she has progress on chemo -The patient understands the goal of care is palliative. -Due to her advanced age and comorbidities, especially dementia, I have low threshold to switch her to palliative care alone if she does not tolerate chemo well or dementia gets worse, her daughter is on -board  -I recommend DNR/DNI, pt and her daughter agrees.    Plan -Reviewed, adequate for treatment, we'll continue irinotecan and Avastin every 2 weeks  -We'll schedule Lab, flush and chemo irinotecan in 4 and 6 weeks -F/u in 4 weeks    All questions were answered. The patient knows to call the clinic with any problems, questions or concerns.  I spent 25 minutes counseling the patient face to face. The total time spent in the  appointment was 30 minutes and more than 50% was on counseling.   [name], MD 09/09/2016    This document serves as a record of services personally performed by [name], MD. It was created on her behalf by Joslyn Devon, a trained medical scribe. The creation of this record is based on the scribe's personal observations and the provider's statements to them. This document has been checked and approved by the attending provider.

## 2016-09-09 ENCOUNTER — Ambulatory Visit (HOSPITAL_BASED_OUTPATIENT_CLINIC_OR_DEPARTMENT_OTHER): Payer: Medicare Other

## 2016-09-09 ENCOUNTER — Telehealth: Payer: Self-pay | Admitting: Hematology

## 2016-09-09 ENCOUNTER — Ambulatory Visit (HOSPITAL_BASED_OUTPATIENT_CLINIC_OR_DEPARTMENT_OTHER): Payer: Medicare Other | Admitting: Hematology

## 2016-09-09 ENCOUNTER — Other Ambulatory Visit: Payer: Self-pay | Admitting: Urology

## 2016-09-09 ENCOUNTER — Other Ambulatory Visit (HOSPITAL_BASED_OUTPATIENT_CLINIC_OR_DEPARTMENT_OTHER): Payer: Medicare Other

## 2016-09-09 VITALS — BP 156/73 | HR 98

## 2016-09-09 VITALS — BP 158/79 | HR 99 | Temp 98.5°F | Resp 17 | Ht 66.0 in | Wt 122.3 lb

## 2016-09-09 DIAGNOSIS — C182 Malignant neoplasm of ascending colon: Secondary | ICD-10-CM

## 2016-09-09 DIAGNOSIS — Z5111 Encounter for antineoplastic chemotherapy: Secondary | ICD-10-CM | POA: Diagnosis not present

## 2016-09-09 DIAGNOSIS — C787 Secondary malignant neoplasm of liver and intrahepatic bile duct: Secondary | ICD-10-CM | POA: Diagnosis not present

## 2016-09-09 DIAGNOSIS — Z5112 Encounter for antineoplastic immunotherapy: Secondary | ICD-10-CM

## 2016-09-09 DIAGNOSIS — F0390 Unspecified dementia without behavioral disturbance: Secondary | ICD-10-CM | POA: Diagnosis not present

## 2016-09-09 DIAGNOSIS — C180 Malignant neoplasm of cecum: Secondary | ICD-10-CM

## 2016-09-09 DIAGNOSIS — D638 Anemia in other chronic diseases classified elsewhere: Secondary | ICD-10-CM

## 2016-09-09 DIAGNOSIS — I10 Essential (primary) hypertension: Secondary | ICD-10-CM

## 2016-09-09 DIAGNOSIS — Z66 Do not resuscitate: Secondary | ICD-10-CM

## 2016-09-09 DIAGNOSIS — C786 Secondary malignant neoplasm of retroperitoneum and peritoneum: Secondary | ICD-10-CM

## 2016-09-09 DIAGNOSIS — Z79899 Other long term (current) drug therapy: Secondary | ICD-10-CM

## 2016-09-09 DIAGNOSIS — N183 Chronic kidney disease, stage 3 (moderate): Secondary | ICD-10-CM

## 2016-09-09 LAB — COMPREHENSIVE METABOLIC PANEL
ALBUMIN: 3.5 g/dL (ref 3.5–5.0)
ALT: 13 U/L (ref 0–55)
AST: 18 U/L (ref 5–34)
Alkaline Phosphatase: 84 U/L (ref 40–150)
Anion Gap: 12 mEq/L — ABNORMAL HIGH (ref 3–11)
BUN: 15.1 mg/dL (ref 7.0–26.0)
CALCIUM: 9.7 mg/dL (ref 8.4–10.4)
CHLORIDE: 108 meq/L (ref 98–109)
CO2: 22 mEq/L (ref 22–29)
Creatinine: 1.2 mg/dL — ABNORMAL HIGH (ref 0.6–1.1)
EGFR: 51 mL/min/{1.73_m2} — ABNORMAL LOW (ref >90)
GLUCOSE: 72 mg/dL (ref 70–140)
POTASSIUM: 4 meq/L (ref 3.5–5.1)
SODIUM: 143 meq/L (ref 136–145)
Total Bilirubin: 0.37 mg/dL (ref 0.20–1.20)
Total Protein: 8 g/dL (ref 6.4–8.3)

## 2016-09-09 LAB — CBC WITH DIFFERENTIAL/PLATELET
BASO%: 0.7 % (ref 0.0–2.0)
Basophils Absolute: 0.1 10*3/uL (ref 0.0–0.1)
EOS%: 4 % (ref 0.0–7.0)
Eosinophils Absolute: 0.3 10*3/uL (ref 0.0–0.5)
HEMATOCRIT: 36.3 % (ref 34.8–46.6)
HEMOGLOBIN: 11.4 g/dL — AB (ref 11.6–15.9)
LYMPH#: 1.1 10*3/uL (ref 0.9–3.3)
LYMPH%: 15.6 % (ref 14.0–49.7)
MCH: 28.9 pg (ref 25.1–34.0)
MCHC: 31.4 g/dL — ABNORMAL LOW (ref 31.5–36.0)
MCV: 91.9 fL (ref 79.5–101.0)
MONO#: 0.3 10*3/uL (ref 0.1–0.9)
MONO%: 4.6 % (ref 0.0–14.0)
NEUT#: 5.1 10*3/uL (ref 1.5–6.5)
NEUT%: 75.1 % (ref 38.4–76.8)
Platelets: 414 10*3/uL — ABNORMAL HIGH (ref 145–400)
RBC: 3.95 10*6/uL (ref 3.70–5.45)
RDW: 19 % — AB (ref 11.2–14.5)
WBC: 6.7 10*3/uL (ref 3.9–10.3)
nRBC: 0 % (ref 0–0)

## 2016-09-09 LAB — CEA (IN HOUSE-CHCC): CEA (CHCC-IN HOUSE): 7.47 ng/mL — AB (ref 0.00–5.00)

## 2016-09-09 LAB — UA PROTEIN, DIPSTICK - CHCC: PROTEIN: 100 mg/dL

## 2016-09-09 MED ORDER — DEXAMETHASONE SODIUM PHOSPHATE 10 MG/ML IJ SOLN
INTRAMUSCULAR | Status: AC
  Filled 2016-09-09: qty 1

## 2016-09-09 MED ORDER — DEXAMETHASONE SODIUM PHOSPHATE 10 MG/ML IJ SOLN
10.0000 mg | Freq: Once | INTRAMUSCULAR | Status: AC
  Administered 2016-09-09: 10 mg via INTRAVENOUS

## 2016-09-09 MED ORDER — IRINOTECAN HCL CHEMO INJECTION 100 MG/5ML
150.0000 mg/m2 | Freq: Once | INTRAVENOUS | Status: AC
  Administered 2016-09-09: 240 mg via INTRAVENOUS
  Filled 2016-09-09: qty 2

## 2016-09-09 MED ORDER — ATROPINE SULFATE 1 MG/ML IJ SOLN: 0.5000 mg | Freq: Once | INTRAMUSCULAR | Status: DC | PRN

## 2016-09-09 MED ORDER — SODIUM CHLORIDE 0.9 % IV SOLN
5.0000 mg/kg | Freq: Once | INTRAVENOUS | Status: AC
  Administered 2016-09-09: 300 mg via INTRAVENOUS
  Filled 2016-09-09: qty 12

## 2016-09-09 MED ORDER — PALONOSETRON HCL INJECTION 0.25 MG/5ML
0.2500 mg | Freq: Once | INTRAVENOUS | Status: AC
Start: 2016-09-09 — End: 2016-09-09
  Administered 2016-09-09: 0.25 mg via INTRAVENOUS

## 2016-09-09 MED ORDER — ATROPINE SULFATE 1 MG/ML IJ SOLN
INTRAMUSCULAR | Status: AC
  Filled 2016-09-09: qty 1

## 2016-09-09 MED ORDER — ACETAMINOPHEN 500 MG PO TABS
ORAL_TABLET | ORAL | Status: AC
  Filled 2016-09-09: qty 2

## 2016-09-09 MED ORDER — PALONOSETRON HCL INJECTION 0.25 MG/5ML
INTRAVENOUS | Status: AC
  Filled 2016-09-09: qty 5

## 2016-09-09 MED ORDER — ACETAMINOPHEN 500 MG PO TABS
1000.0000 mg | ORAL_TABLET | Freq: Once | ORAL | Status: AC
  Administered 2016-09-09: 1000 mg via ORAL

## 2016-09-09 MED ORDER — SODIUM CHLORIDE 0.9 % IV SOLN
Freq: Once | INTRAVENOUS | Status: AC
  Administered 2016-09-09: 10:00:00 via INTRAVENOUS

## 2016-09-09 NOTE — Telephone Encounter (Signed)
09/09/16 LOS has Regimen as IRINOTECAN. Treatment plan and appointment history has Regimen as IRINOTECAN (Campostar) & AVASTIN. Desk nurse/Myrtle, confirmed Regimen as IRINOTECAN/AVASTIN. Patient has no Port/flush not scheduled. Lab and Chemo scheduled in 4 and 6 weeks, per 09/09/16 los. Follow up with Dr Burr Medico scheduled in 4 weeks per 09/09/16 los. Patient was given a copy of the AVS report and appointment schedule, per 09/09/16 los.

## 2016-09-09 NOTE — Patient Instructions (Signed)
[facility] Discharge Instructions for Patients Receiving Chemotherapy  Today you received the following chemotherapy agents: Avastin and Irinotecan   To help prevent nausea and vomiting after your treatment, we encourage you to take your nausea medication as directed.    If you develop nausea and vomiting that is not controlled by your nausea medication, call the clinic.   BELOW ARE SYMPTOMS THAT SHOULD BE REPORTED IMMEDIATELY:  *FEVER GREATER THAN 100.5 F  *CHILLS WITH OR WITHOUT FEVER  NAUSEA AND VOMITING THAT IS NOT CONTROLLED WITH YOUR NAUSEA MEDICATION  *UNUSUAL SHORTNESS OF BREATH  *UNUSUAL BRUISING OR BLEEDING  TENDERNESS IN MOUTH AND THROAT WITH OR WITHOUT PRESENCE OF ULCERS  *URINARY PROBLEMS  *BOWEL PROBLEMS  UNUSUAL RASH Items with * indicate a potential emergency and should be followed up as soon as possible.  Feel free to call the clinic you have any questions or concerns. The clinic phone number is [phone].  Please show the [facility] at check-in to the Emergency Department and triage nurse.

## 2016-09-09 NOTE — Progress Notes (Signed)
Urine protein 100, b/p 158/79, per Dr. Burr Medico okay to proceed with treatment (Avastin).

## 2016-09-10 ENCOUNTER — Encounter: Payer: Self-pay | Admitting: Hematology

## 2016-09-10 DIAGNOSIS — Z66 Do not resuscitate: Secondary | ICD-10-CM | POA: Insufficient documentation

## 2016-09-21 ENCOUNTER — Other Ambulatory Visit: Payer: Self-pay | Admitting: Neurology

## 2016-09-21 MED FILL — MIRTAZAPINE 15 MG TABLET: 15 | 30 days supply | Qty: 30 | Fill #0

## 2016-09-22 MED FILL — CEPHALEXIN 500 MG CAPSULE: 500 | 5 days supply | Qty: 10 | Fill #0

## 2016-09-23 ENCOUNTER — Other Ambulatory Visit (HOSPITAL_BASED_OUTPATIENT_CLINIC_OR_DEPARTMENT_OTHER): Payer: Medicare Other

## 2016-09-23 ENCOUNTER — Ambulatory Visit (HOSPITAL_BASED_OUTPATIENT_CLINIC_OR_DEPARTMENT_OTHER): Payer: Medicare Other

## 2016-09-23 VITALS — BP 129/66 | HR 93 | Temp 98.2°F | Resp 17

## 2016-09-23 DIAGNOSIS — C180 Malignant neoplasm of cecum: Secondary | ICD-10-CM

## 2016-09-23 DIAGNOSIS — Z79899 Other long term (current) drug therapy: Secondary | ICD-10-CM

## 2016-09-23 DIAGNOSIS — Z5111 Encounter for antineoplastic chemotherapy: Secondary | ICD-10-CM | POA: Diagnosis not present

## 2016-09-23 DIAGNOSIS — Z5112 Encounter for antineoplastic immunotherapy: Secondary | ICD-10-CM | POA: Diagnosis not present

## 2016-09-23 DIAGNOSIS — C182 Malignant neoplasm of ascending colon: Secondary | ICD-10-CM

## 2016-09-23 DIAGNOSIS — C786 Secondary malignant neoplasm of retroperitoneum and peritoneum: Secondary | ICD-10-CM | POA: Diagnosis not present

## 2016-09-23 LAB — CBC WITH DIFFERENTIAL/PLATELET
BASO%: 0.6 % (ref 0.0–2.0)
BASOS ABS: 0.1 10*3/uL (ref 0.0–0.1)
EOS%: 3 % (ref 0.0–7.0)
Eosinophils Absolute: 0.2 10*3/uL (ref 0.0–0.5)
HEMATOCRIT: 36.6 % (ref 34.8–46.6)
HEMOGLOBIN: 11.4 g/dL — AB (ref 11.6–15.9)
LYMPH#: 1 10*3/uL (ref 0.9–3.3)
LYMPH%: 12.7 % — ABNORMAL LOW (ref 14.0–49.7)
MCH: 28.6 pg (ref 25.1–34.0)
MCHC: 31.1 g/dL — AB (ref 31.5–36.0)
MCV: 92 fL (ref 79.5–101.0)
MONO#: 0.4 10*3/uL (ref 0.1–0.9)
MONO%: 5.4 % (ref 0.0–14.0)
NEUT#: 6.3 10*3/uL (ref 1.5–6.5)
NEUT%: 78.3 % — AB (ref 38.4–76.8)
PLATELETS: 340 10*3/uL (ref 145–400)
RBC: 3.98 10*6/uL (ref 3.70–5.45)
RDW: 19.1 % — AB (ref 11.2–14.5)
WBC: 8.1 10*3/uL (ref 3.9–10.3)

## 2016-09-23 LAB — COMPREHENSIVE METABOLIC PANEL
ALBUMIN: 3.4 g/dL — AB (ref 3.5–5.0)
ALK PHOS: 79 U/L (ref 40–150)
ALT: 10 U/L (ref 0–55)
ANION GAP: 11 meq/L (ref 3–11)
AST: 16 U/L (ref 5–34)
BUN: 21.1 mg/dL (ref 7.0–26.0)
CALCIUM: 9.8 mg/dL (ref 8.4–10.4)
CHLORIDE: 109 meq/L (ref 98–109)
CO2: 23 mEq/L (ref 22–29)
CREATININE: 1.2 mg/dL — AB (ref 0.6–1.1)
EGFR: 51 mL/min/{1.73_m2} — ABNORMAL LOW (ref >90)
Glucose: 66 mg/dl — ABNORMAL LOW (ref 70–140)
POTASSIUM: 3.9 meq/L (ref 3.5–5.1)
Sodium: 143 mEq/L (ref 136–145)
Total Bilirubin: 0.39 mg/dL (ref 0.20–1.20)
Total Protein: 7.7 g/dL (ref 6.4–8.3)

## 2016-09-23 LAB — UA PROTEIN, DIPSTICK - CHCC: PROTEIN: 30 mg/dL

## 2016-09-23 MED ORDER — DEXAMETHASONE SODIUM PHOSPHATE 10 MG/ML IJ SOLN
10.0000 mg | Freq: Once | INTRAMUSCULAR | Status: AC
  Administered 2016-09-23: 10 mg via INTRAVENOUS

## 2016-09-23 MED ORDER — SODIUM CHLORIDE 0.9 % IV SOLN
5.0000 mg/kg | Freq: Once | INTRAVENOUS | Status: AC
  Administered 2016-09-23: 300 mg via INTRAVENOUS
  Filled 2016-09-23: qty 12

## 2016-09-23 MED ORDER — PALONOSETRON HCL INJECTION 0.25 MG/5ML
INTRAVENOUS | Status: AC
  Filled 2016-09-23: qty 5

## 2016-09-23 MED ORDER — ATROPINE SULFATE 1 MG/ML IJ SOLN: 0.5000 mg | Freq: Once | INTRAMUSCULAR | Status: DC | PRN

## 2016-09-23 MED ORDER — SODIUM CHLORIDE 0.9 % IV SOLN
Freq: Once | INTRAVENOUS | Status: AC
  Administered 2016-09-23: 10:00:00 via INTRAVENOUS

## 2016-09-23 MED ORDER — PALONOSETRON HCL INJECTION 0.25 MG/5ML
0.2500 mg | Freq: Once | INTRAVENOUS | Status: AC
  Administered 2016-09-23: 0.25 mg via INTRAVENOUS

## 2016-09-23 MED ORDER — DEXAMETHASONE SODIUM PHOSPHATE 10 MG/ML IJ SOLN
INTRAMUSCULAR | Status: AC
  Filled 2016-09-23: qty 1

## 2016-09-23 MED ORDER — IRINOTECAN HCL CHEMO INJECTION 100 MG/5ML
150.0000 mg/m2 | Freq: Once | INTRAVENOUS | Status: AC
  Administered 2016-09-23: 240 mg via INTRAVENOUS
  Filled 2016-09-23: qty 10

## 2016-09-23 NOTE — Progress Notes (Signed)
Pt reports that right pinky finger is tender to touch rating 10 out of 10 pain when touched. Patient's daughter states that pt described pain as " it feels like its been burned"  And that pt c/o that "my feet don't feel right" in the morning. Glucose 66. Dr. Burr Medico aware and per Dr. Burr Medico okay to treat, pt eating and drinking. Pt to notify clinic if symptoms worsens.

## 2016-09-23 NOTE — Patient Instructions (Signed)
[facility] Discharge Instructions for Patients Receiving Chemotherapy  Today you received the following chemotherapy agents: Avastin and Irinotecan   To help prevent nausea and vomiting after your treatment, we encourage you to take your nausea medication as directed.    If you develop nausea and vomiting that is not controlled by your nausea medication, call the clinic.   BELOW ARE SYMPTOMS THAT SHOULD BE REPORTED IMMEDIATELY:  *FEVER GREATER THAN 100.5 F  *CHILLS WITH OR WITHOUT FEVER  NAUSEA AND VOMITING THAT IS NOT CONTROLLED WITH YOUR NAUSEA MEDICATION  *UNUSUAL SHORTNESS OF BREATH  *UNUSUAL BRUISING OR BLEEDING  TENDERNESS IN MOUTH AND THROAT WITH OR WITHOUT PRESENCE OF ULCERS  *URINARY PROBLEMS  *BOWEL PROBLEMS  UNUSUAL RASH Items with * indicate a potential emergency and should be followed up as soon as possible.  Feel free to call the clinic you have any questions or concerns. The clinic phone number is [phone].  Please show the [facility] at check-in to the Emergency Department and triage nurse.

## 2016-09-26 ENCOUNTER — Telehealth: Payer: Self-pay | Admitting: Neurology

## 2016-09-26 DIAGNOSIS — N390 Urinary tract infection, site not specified: Secondary | ICD-10-CM

## 2016-09-26 HISTORY — DX: Urinary tract infection, site not specified: N39.0

## 2016-09-26 MED FILL — MYRBETRIQ ER 25 MG TABLET: 25 | 30 days supply | Qty: 30 | Fill #0

## 2016-09-26 NOTE — Telephone Encounter (Signed)
Caller: Daughter  Urgent? No   Reason for the call: She said things are getting worse now. She is having hallucinations and she would like to speak with you. Please call. Thanks

## 2016-09-26 NOTE — Telephone Encounter (Signed)
Called patient's daughter back and left message for her to call me.

## 2016-09-27 ENCOUNTER — Encounter (HOSPITAL_BASED_OUTPATIENT_CLINIC_OR_DEPARTMENT_OTHER): Payer: Self-pay | Admitting: *Deleted

## 2016-09-27 MED FILL — AMOX TR-K CLV 875-125 MG TA: 875-125 | 5 days supply | Qty: 10 | Fill #0

## 2016-09-27 NOTE — Telephone Encounter (Signed)
Caryl Pina tried to call patient's daughter with no return call.  [facility] please follow up.

## 2016-09-27 NOTE — Progress Notes (Signed)
Spoke with daughter Vergia Alberts has dementia To [facility] at 1215- [place],Ekg on arrival-Npo solids after Mn-clear liquids until 0800-then Npo.will take amlodipine in am.

## 2016-10-04 MED FILL — AMLODIPINE BESYLATE 5 MG TA: 5 | 30 days supply | Qty: 30 | Fill #2

## 2016-10-06 NOTE — Progress Notes (Signed)
[place]  [phone] [phone]  Clinic Follow Up Note   Patient Care Team: [name], MD as PCP - General (Internal Medicine) [name], MD as Consulting Physician (Medical Oncology) [name], MD as Consulting Physician (General Surgery) [name], MD as Consulting Physician (Orthopedic Surgery) [name] [name], MD as Consulting Physician (Urology) 10/07/2016     CHIEF COMPLAINTS:  Follow up metastatic colon cancer  Oncology History   Cancer of right colon [facility])   Staging form: Colon and Rectum, AJCC 7th Edition     Pathologic stage from 01/23/2015: Stage IIC (T4b, N0, cM0) - Signed by [name], MD on 02/05/2015       Cancer of right colon ([place])   01/21/2015 Imaging     CT abdomen and pelvis showed high-grade small bowel obstruction,  right I [place] ureteral nephrosis,  multiple liver cysts.  CT chest was negative for metastatic lesion.      01/23/2015 Pathology Results     invasive adenocarcinoma extending into the pericecal connective tissue,  grade 2, LVI (-),  no perineural invasion, 3 lymph nodes were negative.      01/23/2015 Surgery      terminal ileum and cecum seegmental resection with anastomosis by Dr. Hassell Done      01/23/2015 Initial Diagnosis    Cancer of right colon ([place])      03/13/2015 - 07/06/2015 Chemotherapy    Xeloda 1500 mg bid X 14 days on-7 off, s/p 5 cycles, stopped early due to wrosening renal function      10/28/2015 Progression    PET scan showed multiple hypermetabolic peritoneal nodules, right ovarian cystic mass, and hypermetabolic liver lesion, highly suspicious for metastatic colon cancer. Small pulmonary nodules are indeterminate.       11/11/2015 - 05/20/2016 Chemotherapy    Xeloda '1500mg'$  bid, 2 weeks on and one week off, Avastin was added on from cycle 3. Xeloda held from 05/20/16 due to progression on CT from 05/19/16 and total bilirubin increased to 2.6.      01/08/2016 Imaging   Ct chest, Abdomen Pelvis Wo Contrast 01/08/2016  IMPRESSION: 1. 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 2. Hepatic and renal cysts. 3.  Aortoiliac atherosclerotic vascular disease. 4. Lumbar spondylosis and degenerative disc disease with multilevel Impingement.      05/19/2016 Imaging    CT CAP w Contrast IMPRESSION: 1. Numerous small solid pulmonary nodules, one of which is new, and several of which have mildly increased in size since 10/28/2015, worrisome for mild progression of pulmonary metastases. 2. Solitary right liver lobe metastasis is mildly increased in size since 01/08/2016. 3. Peritoneal metastases have mildly increased in size since 01/08/2016, largest in the right pelvic sidewall. 4. Well-positioned right nephroureteral stent without overt right hydronephrosis. 5. Additional findings include aortic atherosclerosis, 1 vessel coronary atherosclerosis and mild to moderate emphysema.      06/03/2016 -  Chemotherapy    Irinotecan and Avastin every 2 weeks starting 06/03/16      07/29/2016 Tumor Marker    CEA 6.51       08/09/2016 Imaging    PET  IMPRESSION: 1. Overall mild improvement compared to FDG PET scan of 10/28/2015. 2. Pulmonary nodules are increased in size from PET-CT scan and similar size to most recent CT scan. Largest lesion RIGHT lower lobe does have associated faint metabolic activity consistent with pulmonary metastasis. 3. Decrease in metabolic activity of RIGHT hepatic lobe lesion. Potential second  lesion LEFT hepatic lobe with slightly lower metabolic activity. 4. Decreased metabolic activity at the enteric colonic anastomosis in the RIGHT lower quadrant. 5. Interval decrease in metabolic activity of peritoneal nodular metastasis. 6. Nodular implant implant along ventral abdominal wall may be within the musculature, but also improved.       HISTORY OF PRESENTING ILLNESS  (02/05/2015):  Beth Wilkins 76 y.o. female  with past medical history of moderate dementia, hypertension, is here because of recently diagnosed stage II colon cancer. She is accompanied to the clinic by her daughter. History is mainly obtained from the chart and her daughter.  She has had abdominal discomfort, nausea and vomiting, and norexia for  the past to 3 months along with 15 pounds weight loss, . Her nausea was mild and intermittent, she does not seek medical attention initially. She presented with  worsening symptoms for 2-3 weeks, and episodes of projectile vomiting and dehydration to emergency room, was admitted to [facility] on 01/20/2015. CT scan reviewed small bowel obstruction, and possible appendiceal rupture. She was brought to OR on 01/23/2015, underwent right hemicolectomy with anastomosis by Dr. Hassell Done. Surgical path reviewed adenocarcinoma at the cecum. She was discharged home on 01/26/2015.  She has recovered well, eating much better than prior to surgery, move around without restriction. No pain, she has loose BM 3 times a day, and she has backed off her mirealax.  she lives with her daughter. She states her appetite is moderate, sometime low, and she refuses to eat if she doesn't have appetite. No nausea, vomiting, abdominal bloating since her surgery.  Her last colonoscopy was 6-8 years ago, and her stool OB was negative this year.   CURRENT THERAPY: Irinotecan and Avastin every 2 weeks started 06/03/16  INTERIM HISTORY:  Beth Wilkins presents to the clinic today accompanied by her daughter  for follow up and cycle treatment. Per her daughter, she has been doing ok this week despite her dementia. She has had low appetite, nausea and diarrhea that has happened about 2 days following her last treatment. Patient admits to having a headache and some soreness on her lip. She denies any associated bleeding. Her daughter has been supplementing her with Boost whenever her mother does not eat.  She denies any numbness or tingling or bowel movement problems. Overall, she has been doing well with her chemo treatments.   She is worried that chemotherapy along with her antidepressant medication could be causing her fatigue.    MEDICAL HISTORY:  Past Medical History:  Diagnosis Date  . Anxiety   . Arthritis   . Bilateral renal cysts   . Centrilobular emphysema ([place])   . Chronic low back pain   . CKD (chronic kidney disease), stage III   . Colon cancer [facility]) oncologist-  dr [name]   dx 01-23-2015  right colon Invasive adenocarcinoma into pericecal connective tissue and involving small intestine , Stage IIc, Grade 2, LVI(-),  (pT4b N0 M0) /  s/p resection termial ileum and cecum and chemotherapy 01-06-217 to 07-06-2015/   08/ 2017  per PET  recurrent w/ liver mets and peritoneal carcinomatosis  . Complication of anesthesia    can be combative due to dementia  . Full dentures   . History of gastroesophageal reflux (GERD)    changes diet  . History of pelvic fracture   . Hyperlipidemia   . Hypertension   . Liver metastasis ([place])   . Moderate dementia without behavioral disturbance    moderate to severe  dementia per neurologist note (dr Krista Blue) in epic  . Pulmonary nodules   . Right ovarian cyst   . 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 UTI (urinary tract infection) 09/26/2016   5 day antibiotic treatment    SURGICAL HISTORY: Past Surgical History:  Procedure Laterality Date  . CATARACT EXTRACTION W/ INTRAOCULAR LENS  IMPLANT, BILATERAL  2005 approx  . CYSTOSCOPY W/ URETERAL STENT PLACEMENT Right 12/03/2015   Procedure: CYSTOSCOPY WITH RETROGRADE PYELOGRAM/URETERAL STENT PLACEMENT;  Surgeon: Cleon Gustin, MD;  Location: [facility];  Service: Urology;  Laterality: Right;  . CYSTOSCOPY W/ URETERAL STENT PLACEMENT Right 02/15/2016   Procedure:  CYSTOSCOPY WITH RETROGRADE PYELOGRAM/URETERAL STENT REPLACEMENT;  Surgeon: Cleon Gustin, MD;  Location[facility];  Service: Urology;  Laterality: Right;  . CYSTOSCOPY W/ URETERAL STENT PLACEMENT Right 03/28/2016   Procedure: CYSTOSCOPY WITH RETROGRADE PYELOGRAM/URETERAL STENT EXCHANGE;  Surgeon: Cleon Gustin, MD;  Location: [facility];  Service: Urology;  Laterality: Right;  . CYSTOSCOPY W/ URETERAL STENT PLACEMENT Right 07/18/2016   Procedure: CYSTOSCOPY WITH RETROGRADE PYELOGRAM/ STENT EXCHANGE;  Surgeon: Cleon Gustin, MD;  Location: [facility];  Service: Urology;  Laterality: Right;  . CYSTOSCOPY WITH RETROGRADE PYELOGRAM, URETEROSCOPY AND STENT PLACEMENT Right 09/21/2015   Procedure: CYSTOSCOPY WITH BIL RETROGRADE PYELOGRAM, URETEROSCOPY, RIGHT STENT PLACEMENT ;  Surgeon: Cleon Gustin, MD;  Location: [facility];  Service: Urology;  Laterality: Right;  . LAPAROSCOPY N/A 01/23/2015   Procedure: DIAGNOSTIC LAPAROSCOPY, EXPLORATORY LAPAROTOMY  RIGHT HEMI-COLECTOMY FOR OBSTRUCTION OF RIGHT TERMINAL ILEUM;  Surgeon: [name], MD;  Location: [facility];  Service: General;  Laterality: N/A;    SOCIAL HISTORY: Social History   Social History  . Marital status: Single    Spouse name: N/A  . Number of children: N/A  . Years of education: N/A   Occupational History  . Not on file.   Social History Main Topics  . Smoking status: Former Smoker    Packs/day: 0.25    Years: 50.00    Types: Cigarettes    Quit date: 01/25/2015  . Smokeless tobacco: Never Used  . Alcohol use 1.8 oz/week    2 Glasses of wine, 1 Cans of beer per week     Comment: occasional   . Drug use: No  . Sexual activity: Not on file   Other Topics Concern  . Not on file   Social History Narrative   Legally separated   Lives w/daughter, Ceira Hoeschen   Has total of #2 daughters and #1 son   Dementia   Fall Risk-ambulates  w/cane    FAMILY HISTORY: Family History  Problem Relation Age of Onset  . Diabetes Mellitus II Father   . Cancer Mother 5       ovarian cancer   . Cervical cancer Other     ALLERGIES:  is allergic to namenda [memantine hcl] and oxycodone.  MEDICATIONS:  Current Outpatient Prescriptions  Medication Sig Dispense Refill  . amLODipine (NORVASC) 5 MG tablet Take 5 mg by mouth every morning.     . Cholecalciferol (VITAMIN D3) 2000 UNITS TABS Take by mouth daily.    . Coenzyme Q10 (CO Q 10) 100 MG CAPS Take by mouth daily.    Marland Kitchen donepezil (ARICEPT) 10 MG tablet TAKE 1 TABLET BY MOUTH EVERY DAY 30 tablet 11  . Meth-Hyo-M  Bl-Na Phos-Ph Sal (URIBEL) 118 MG CAPS Take 1 capsule (118 mg total) by mouth 2 (two) times daily as needed. 30 capsule 3  . mirabegron ER (MYRBETRIQ) 50 MG TB24 tablet Take 50 mg by mouth daily.    . mirtazapine (REMERON) 15 MG tablet TAKE 1 TABLET BY MOUTH AT BEDTIME 30 tablet 3  . Multiple Vitamins-Minerals (OCUVITE-LUTEIN PO) Take 1 tablet by mouth daily.    . Multiple Vitamins-Minerals (WOMENS MULTIVITAMIN PO) Take 1 tablet by mouth daily.    . polyethylene glycol (MIRALAX / GLYCOLAX) packet Take 17 g by mouth daily. (Patient taking differently: Take 17 g by mouth daily as needed. ) 14 each 0  . prochlorperazine (COMPAZINE) 5 MG tablet Take 1 tablet (5 mg total) by mouth every 6 (six) hours as needed for nausea or vomiting. 30 tablet 0  . UNABLE TO FIND Med Name: Closys Mouth Rinse two-three times daily for ulcer.    . vitamin B-12 (CYANOCOBALAMIN) 1000 MCG tablet Take 1,000 mcg by mouth daily.     No current facility-administered medications for this visit.     REVIEW OF SYSTEMS: Constitutional: Denies fevers, chills or abnormal night sweats (+) fatigue (+) lost 3 pounds  Eyes: Denies blurriness of vision, double vision or watery eyes  Ears, nose, mouth, throat, and face: Denies mucositis or sore throat Respiratory: Denies cough, dyspnea or  wheezes Cardiovascular: Denies palpitation, chest discomfort or lower extremity swelling Gastrointestinal:  Denies heartburn  Skin: Denies abnormal skin rashes Lymphatics: Denies new lymphadenopathy or easy bruising Neurological:Denies numbness, tingling or new weaknesses Behavioral/Psych: Mood is stable, no new changes (+) Dementia All other systems were reviewed with the patient and are negative.  PHYSICAL EXAMINATION:  ECOG PERFORMANCE STATUS: 1 - Symptomatic but completely ambulatory  Vitals:   10/07/16 0856  BP: 137/65  Pulse: 97  Resp: 20  Temp: 98.5 F (36.9 C)   Filed Weights   10/07/16 0856  Weight: 119 lb (54 kg)     GENERAL:alert, no distress and comfortable SKIN: skin color, texture, turgor are normal, no rashes or significant lesions EYES: normal, conjunctiva are pink and non-injected, sclera clear (+) difficult to evaluate due to her dementia OROPHARYNX:no exudate and tongue normal (+) Upper and lower dentures  NECK: supple, thyroid normal size, non-tender, without nodularity LYMPH:  no palpable lymphadenopathy in the cervical, axillary or inguinal LUNGS: clear to auscultation and percussion with normal breathing effort HEART: regular rate & rhythm and no murmurs and no lower extremity edema ABDOMEN:abdomen soft, non-tender and normal bowel sounds  Musculoskeletal:no cyanosis of digits and no clubbing  PSYCH: alert & oriented x 3 with fluent speech (+) dementia  NEURO: no focal motor/sensory deficits  LABORATORY DATA:  I have reviewed the data as listed CBC Latest Ref Rng & Units 10/07/2016 09/23/2016 09/09/2016  WBC 3.9 - 10.3 10e3/uL 6.9 8.1 6.7  Hemoglobin 11.6 - 15.9 g/dL 11.7 11.4(L) 11.4(L)  Hematocrit 34.8 - 46.6 % 35.9 36.6 36.3  Platelets 145 - 400 10e3/uL 346 340 414(H)    CMP Latest Ref Rng & Units 10/07/2016 09/23/2016 09/09/2016  Glucose 70 - 140 mg/dl 94 66(L) 72  BUN 7.0 - 26.0 mg/dL 25.6 21.1 15.1  Creatinine 0.6 - 1.1 mg/dL 1.4(H) 1.2(H) 1.2(H)   Sodium 136 - 145 mEq/L 143 143 143  Potassium 3.5 - 5.1 mEq/L 3.7 3.9 4.0  Chloride 101 - 111 mmol/L - - -  CO2 22 - 29 mEq/L '23 23 22  '$ Calcium 8.4 - 10.4 mg/dL 9.8  9.8 9.7  Total Protein 6.4 - 8.3 g/dL 8.1 7.7 8.0  Total Bilirubin 0.20 - 1.20 mg/dL 0.49 0.39 0.37  Alkaline Phos 40 - 150 U/L 73 79 84  AST 5 - 34 U/L '18 16 18  '$ ALT 0 - 55 U/L '9 10 13    '$ CEA  01/23/2015: 5.7 06/05/2015: 6.1 08/04/2015: 10.3 10/12/2015: 13.29 (in-house)  11/04/2015: 12.83 01/13/2016: 8.57 02/24/2016: 8.41 04/27/2016: 9.22 05/20/2016: 8.32 06/17/2016: 6.45 07/15/2016: 5.07 07/29/2016: 6.51 09/09/16: 7.47  PATHOLOGY REPORT: Diagnosis 01/23/2015 Colon, segmental resection, with terminal ileum and cecum - INVASIVE ADENOCARCINOMA EXTENDING INTO PERICECAL CONNECTIVE TISSUE AND INVOLVING SMALL INTESTINE. - THREE BENIGN LYMPH NODES (0/3). - SEE ONCOLOGY TABLE BELOW.  Microscopic Comment COLON AND RECTUM (INCLUDING TRANS-ANAL RESECTION): Specimen: Terminal ileum and cecum. Procedure: Resection. Tumor site: Cecum adjacent to appendix. Specimen integrity: Intact. Macroscopic intactness of mesorectum: Not applicable. Macroscopic tumor perforation: No. Invasive tumor: Maximum size: 4.0 cm. Histologic type(s): Colorectal adenocarcinoma. Histologic grade and differentiation: G2: moderately differentiated/low grade. Type of polyp in which invasive carcinoma arose: No residual polyp. Microscopic extension of invasive tumor: Into pericecal connective tissue and terminal ileum. Lymph-Vascular invasion: Not identified. Peri-neural invasion: Not identified. Tumor deposit(s) (discontinuous extramural extension): No. Resection margins: Proximal margin: Free of tumor. Distal margin: Free of tumor. Circumferential (radial) (posterior ascending, posterior descending; lateral and posterior mid-rectum; and entire lower 1/3 rectum): Free of tumor. Mesenteric margin (sigmoid and transverse): N/A. Distance closest margin  (if all above margins negative): N/A. Treatment effect (neo-adjuvant therapy): No. Additional polyp(s): No. Non-neoplastic findings: N/A. Lymph nodes: number examined 3; number positive: 0. Pathologic Staging: pT4b, pN0, pMX. Ancillary studies: Mismatch repair protein by immunohistochemistry pending. Comments: There is a colorectal-type adenocarcinoma arising in the cecum in the area of the appendiceal orifice. The tumor extends into the pericecal connective tissue, and involves the attached segment of terminal ileum. Immunohistochemistry shows the tumor is positive with CDX2 and cytokeratin 20, and negative with cytokeratin 7, estrogen receptor and WT-1 supporting a diagnosis of primary colorectal adenocarcinoma. ADDITIONAL INFORMATION: Mismatch Repair (MMR) Protein Immunohistochemistry (IHC) IHC Expression Result: MLH1: Preserved nuclear expression (greater 50% tumor expression) MSH2: Preserved nuclear expression (greater 50% tumor expression) MSH6: Preserved nuclear expression (greater 50% tumor expression) PMS2: Preserved nuclear expression (greater 50% tumor expression) * Internal control demonstrates intact nuclear expression Interpretation: NORMAL There is preserved expression of the major and minor MMR proteins. There is a very low probability that microsatellite instability (MSI) is present. However, certain clinically significant MMR protein mutations may result in preservation of nuclear expression. It is recommended that the preservation of protein expression be correlated with molecular based MSI testing.    RADIOGRAPHIC STUDIES: I have personally reviewed the radiological images as listed and agreed with the findings in the report.  PET 08/09/2016 IMPRESSION: 1. Overall mild improvement compared to FDG PET scan of 10/28/2015. 2. Pulmonary nodules are increased in size from PET-CT scan and similar size to most recent CT scan. Largest lesion RIGHT lower lobe does have  associated faint metabolic activity consistent with pulmonary metastasis. 3. Decrease in metabolic activity of RIGHT hepatic lobe lesion. Potential second lesion LEFT hepatic lobe with slightly lower metabolic activity. 4. Decreased metabolic activity at the enteric colonic anastomosis in the RIGHT lower quadrant. 5. Interval decrease in metabolic activity of peritoneal nodular metastasis. 6. Nodular implant implant along ventral abdominal wall may be within the musculature, but also improved.  CT CAP w Contrast 05/19/2016 IMPRESSION: 1. Numerous small solid pulmonary nodules, one of which  is new, and several of which have mildly increased in size since 10/28/2015, worrisome for mild progression of pulmonary metastases. 2. Solitary right liver lobe metastasis is mildly increased in size since 01/08/2016. 3. Peritoneal metastases have mildly increased in size since 01/08/2016, largest in the right pelvic sidewall. 4. Well-positioned right nephroureteral stent without overt right hydronephrosis. 5. Additional findings include aortic atherosclerosis, 1 vessel coronary atherosclerosis and mild to moderate emphysema.  Ct chest, Abdomen Pelvis Wo Contrast 01/08/2016  IMPRESSION: 1. 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 2. Hepatic and renal cysts. 3.  Aortoiliac atherosclerotic vascular disease. 4. Lumbar spondylosis and degenerative disc disease with multilevel impingement.  ASSESSMENT & PLAN: 76 y.o. African-American female, with past medical history of moderate dementia, independent with ADLs, hypertension, presents with small bowel obstruction and weight loss.  1. 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 -I personally reviewed her recent PET scan images, unfortunately the scan showed hypermetabolic peritoneal and liver metastases. 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 -She started Xeloda '1500mg'$  bid, 2 weeks on and one week off. 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 The tumor has MSI stable, she is not a candidate for immunotherapy, although she may be a candidate for clinical trial of immunotherapy plus MEK inhibitor at [facility]. -I previously reviewed her restaging CT scan from 01/08/2016, which showed overall stable disease, no new lesions. We'll continue her chemotherapy. -I previously reviewed the CT from 05/19/2016 with the patient and her daughter in detail. She has had a moderate disease progression in lungs, liver and peritoneum. I recommended her to change treatment. -she is now on second line single agent rinotecan at a reduced dose '150mg'$ /m2, tolerated well, will continue. -The goal of therapy is palliative, mainly to improve her quality of life. -If she develops poor tolerance to chemotherapy, or further disease progression on next scan, I will likely stop chemotherapy and change to palliative care alone, due to her advanced age and comorbidities, especially dementia. - PET Scan image from 08/09/16 reviewed in person, and I gave her a copy of the report. She has good partial response to treatment. We will continue irinotecan and Avastin. -Wepreviously  discussed Pt being able to take chemo break and she will notify us if needed  -Labs reviewed and tolerating chemo well. Her hemoglobin is 11.7 today. Her kidney  function is stable. She is adequate for treatment today and will continue every 2 weeks - Due to her daughter's wedding on Sept 29, I will postpone her sept 28 appointment - follow up in 4 weeks -Repeat scan in early October - I strongly encouraged her to eat more to obtain more energy  2. CKD stage III, right hydroureter -Her Cr was around 1 in 08/2013, Cr increased to 2.4 on 01/20/2015 when she was diagnosed with colon cancer due to dehydration, improved some and moved around1.3-1.5 .  -A previous CT scan showed  right hydroureter, which probably contributes to her renal insufficiency. -she had ureter stent placement again in 12/2015, 03/2016, and 04/2016 -Patient had a stent replacement on 07/19/2015 - Cr has improved some lately -she is scheduled for ureter stent exchange  next week  3. HTN, dementia -She will continue her medication and follow-up with her primary care physician. -Her BP has been better controlled lately, we discussed that Avastin can cause hypertension, we'll continue monitoring closely. -Her dementia has been getting worse gradually, which has increased her care buren on her daughter. I previously discussed burn-out prevention with her daughter, she appreciated and has planned to have a care-giver for her mother   28. Nausea and diarrhea -Secondary to chemotherapy, overall very mild and manageable  -She is to take Imodium PRN and her nausea medications.  8. Goal of care discussion  -We previously discussed the incurable nature of her cancer, and the overall poor prognosis, especially if she has progress on chemo -The patient understands the goal of care is palliative. -Due to her advanced age and comorbidities, especially dementia, I have low threshold to switch her to palliative care alone if she does not tolerate chemo well or dementia gets worse, her daughter is on -board  -I recommend DNR/DNI, pt and her daughter agrees.    Plan -Labs reviewed, adequate for  treatment, we'll continue irinotecan and Avastin today and every 2 weeks  -We'll schedule Lab, flush and chemo irinotecan in 4 and 6 weeks -F/u in 4 weeks  - repeat scan early October  All questions were answered. The patient knows to call the clinic with any problems, questions or concerns.  I spent 25 minutes counseling the patient face to face. The total time spent in the appointment was 30 minutes and more than 50% was on counseling.  This document serves as a record of services personally performed by [name], MD. It was created on her behalf by Brandt Loosen, a trained medical scribe. The creation of this record is based on the scribe's personal observations and the provider's statements to them. This document has been checked and approved by the attending provider.

## 2016-10-07 ENCOUNTER — Telehealth: Payer: Self-pay | Admitting: Hematology

## 2016-10-07 ENCOUNTER — Other Ambulatory Visit (HOSPITAL_BASED_OUTPATIENT_CLINIC_OR_DEPARTMENT_OTHER): Payer: Medicare Other

## 2016-10-07 ENCOUNTER — Ambulatory Visit (HOSPITAL_BASED_OUTPATIENT_CLINIC_OR_DEPARTMENT_OTHER): Payer: Medicare Other

## 2016-10-07 ENCOUNTER — Encounter: Payer: Self-pay | Admitting: Hematology

## 2016-10-07 ENCOUNTER — Ambulatory Visit (HOSPITAL_BASED_OUTPATIENT_CLINIC_OR_DEPARTMENT_OTHER): Payer: Medicare Other | Admitting: Hematology

## 2016-10-07 VITALS — BP 137/65 | HR 97 | Temp 98.5°F | Resp 20 | Ht 66.0 in | Wt 119.0 lb

## 2016-10-07 VITALS — BP 131/81 | HR 88

## 2016-10-07 DIAGNOSIS — D638 Anemia in other chronic diseases classified elsewhere: Secondary | ICD-10-CM

## 2016-10-07 DIAGNOSIS — C182 Malignant neoplasm of ascending colon: Secondary | ICD-10-CM

## 2016-10-07 DIAGNOSIS — C786 Secondary malignant neoplasm of retroperitoneum and peritoneum: Secondary | ICD-10-CM | POA: Diagnosis not present

## 2016-10-07 DIAGNOSIS — F0390 Unspecified dementia without behavioral disturbance: Secondary | ICD-10-CM | POA: Diagnosis not present

## 2016-10-07 DIAGNOSIS — I10 Essential (primary) hypertension: Secondary | ICD-10-CM

## 2016-10-07 DIAGNOSIS — C180 Malignant neoplasm of cecum: Secondary | ICD-10-CM

## 2016-10-07 DIAGNOSIS — Z5111 Encounter for antineoplastic chemotherapy: Secondary | ICD-10-CM | POA: Diagnosis not present

## 2016-10-07 DIAGNOSIS — Z5112 Encounter for antineoplastic immunotherapy: Secondary | ICD-10-CM | POA: Diagnosis not present

## 2016-10-07 LAB — COMPREHENSIVE METABOLIC PANEL
ALT: 9 U/L (ref 0–55)
ANION GAP: 13 meq/L — AB (ref 3–11)
AST: 18 U/L (ref 5–34)
Albumin: 3.4 g/dL — ABNORMAL LOW (ref 3.5–5.0)
Alkaline Phosphatase: 73 U/L (ref 40–150)
BUN: 25.6 mg/dL (ref 7.0–26.0)
CHLORIDE: 108 meq/L (ref 98–109)
CO2: 23 meq/L (ref 22–29)
CREATININE: 1.4 mg/dL — AB (ref 0.6–1.1)
Calcium: 9.8 mg/dL (ref 8.4–10.4)
EGFR: 43 mL/min/{1.73_m2} — AB (ref >90)
Glucose: 94 mg/dl (ref 70–140)
Potassium: 3.7 mEq/L (ref 3.5–5.1)
Sodium: 143 mEq/L (ref 136–145)
Total Bilirubin: 0.49 mg/dL (ref 0.20–1.20)
Total Protein: 8.1 g/dL (ref 6.4–8.3)

## 2016-10-07 LAB — CBC WITH DIFFERENTIAL/PLATELET
BASO%: 0.8 % (ref 0.0–2.0)
Basophils Absolute: 0.1 10*3/uL (ref 0.0–0.1)
EOS%: 4.3 % (ref 0.0–7.0)
Eosinophils Absolute: 0.3 10*3/uL (ref 0.0–0.5)
HCT: 35.9 % (ref 34.8–46.6)
HGB: 11.7 g/dL (ref 11.6–15.9)
LYMPH%: 15.2 % (ref 14.0–49.7)
MCH: 29 pg (ref 25.1–34.0)
MCHC: 32.5 g/dL (ref 31.5–36.0)
MCV: 89.3 fL (ref 79.5–101.0)
MONO#: 0.5 10*3/uL (ref 0.1–0.9)
MONO%: 7.7 % (ref 0.0–14.0)
NEUT#: 5 10*3/uL (ref 1.5–6.5)
NEUT%: 72 % (ref 38.4–76.8)
PLATELETS: 346 10*3/uL (ref 145–400)
RBC: 4.02 10*6/uL (ref 3.70–5.45)
RDW: 18.9 % — ABNORMAL HIGH (ref 11.2–14.5)
WBC: 6.9 10*3/uL (ref 3.9–10.3)
lymph#: 1 10*3/uL (ref 0.9–3.3)

## 2016-10-07 LAB — UA PROTEIN, DIPSTICK - CHCC: PROTEIN: 30 mg/dL

## 2016-10-07 LAB — CEA (IN HOUSE-CHCC): CEA (CHCC-In House): 7.85 ng/mL — ABNORMAL HIGH (ref 0.00–5.00)

## 2016-10-07 MED ORDER — DEXTROSE 5 % IV SOLN
150.0000 mg/m2 | Freq: Once | INTRAVENOUS | Status: AC
  Administered 2016-10-07: 240 mg via INTRAVENOUS
  Filled 2016-10-07: qty 10

## 2016-10-07 MED ORDER — SODIUM CHLORIDE 0.9 % IV SOLN
Freq: Once | INTRAVENOUS | Status: AC
  Administered 2016-10-07: 11:00:00 via INTRAVENOUS

## 2016-10-07 MED ORDER — PALONOSETRON HCL INJECTION 0.25 MG/5ML
INTRAVENOUS | Status: AC
  Filled 2016-10-07: qty 5

## 2016-10-07 MED ORDER — DEXAMETHASONE SODIUM PHOSPHATE 10 MG/ML IJ SOLN
10.0000 mg | Freq: Once | INTRAMUSCULAR | Status: AC
  Administered 2016-10-07: 10 mg via INTRAVENOUS

## 2016-10-07 MED ORDER — DEXAMETHASONE SODIUM PHOSPHATE 10 MG/ML IJ SOLN
INTRAMUSCULAR | Status: AC
  Filled 2016-10-07: qty 1

## 2016-10-07 MED ORDER — ATROPINE SULFATE 1 MG/ML IJ SOLN
INTRAMUSCULAR | Status: AC
  Filled 2016-10-07: qty 1

## 2016-10-07 MED ORDER — SODIUM CHLORIDE 0.9 % IV SOLN
5.0000 mg/kg | Freq: Once | INTRAVENOUS | Status: AC
  Administered 2016-10-07: 300 mg via INTRAVENOUS
  Filled 2016-10-07: qty 12

## 2016-10-07 MED ORDER — PALONOSETRON HCL INJECTION 0.25 MG/5ML
0.2500 mg | Freq: Once | INTRAVENOUS | Status: AC
  Administered 2016-10-07: 0.25 mg via INTRAVENOUS

## 2016-10-07 MED ORDER — ATROPINE SULFATE 1 MG/ML IJ SOLN
0.5000 mg | Freq: Once | INTRAMUSCULAR | Status: AC | PRN
  Administered 2016-10-07: 0.5 mg via INTRAVENOUS

## 2016-10-07 NOTE — Telephone Encounter (Signed)
Scheduled appt per 8/3 los - Gave patient AVS and calender per los.  

## 2016-10-07 NOTE — Patient Instructions (Signed)
[facility] [facility] Discharge Instructions for Patients Receiving Chemotherapy  Today you received the following chemotherapy agents: Irinotecan and Avastin   To help prevent nausea and vomiting after your treatment, we encourage you to take your nausea medication as directed.  If you develop nausea and vomiting that is not controlled by your nausea medication, call the clinic.   BELOW ARE SYMPTOMS THAT SHOULD BE REPORTED IMMEDIATELY:  *FEVER GREATER THAN 100.5 F  *CHILLS WITH OR WITHOUT FEVER  NAUSEA AND VOMITING THAT IS NOT CONTROLLED WITH YOUR NAUSEA MEDICATION  *UNUSUAL SHORTNESS OF BREATH  *UNUSUAL BRUISING OR BLEEDING  TENDERNESS IN MOUTH AND THROAT WITH OR WITHOUT PRESENCE OF ULCERS  *URINARY PROBLEMS  *BOWEL PROBLEMS  UNUSUAL RASH Items with * indicate a potential emergency and should be followed up as soon as possible.  Feel free to call the clinic you have any questions or concerns. The clinic phone number is [phone].  Please show the [place] at check-in to the Emergency Department and triage nurse.

## 2016-10-10 ENCOUNTER — Ambulatory Visit (HOSPITAL_BASED_OUTPATIENT_CLINIC_OR_DEPARTMENT_OTHER)
Admission: RE | Admit: 2016-10-10 | Discharge: 2016-10-10 | Disposition: A | Discharge status: Home / Self Care | Payer: Medicare Other | Source: Ambulatory Visit | Attending: Urology | Admitting: Urology

## 2016-10-10 ENCOUNTER — Ambulatory Visit (HOSPITAL_BASED_OUTPATIENT_CLINIC_OR_DEPARTMENT_OTHER): Payer: Medicare Other | Admitting: Anesthesiology

## 2016-10-10 ENCOUNTER — Encounter (HOSPITAL_BASED_OUTPATIENT_CLINIC_OR_DEPARTMENT_OTHER): Payer: Self-pay | Admitting: *Deleted

## 2016-10-10 ENCOUNTER — Encounter (HOSPITAL_BASED_OUTPATIENT_CLINIC_OR_DEPARTMENT_OTHER)
Admission: RE | Disposition: A | Discharge status: Home / Self Care | Payer: Self-pay | Source: Ambulatory Visit | Attending: Urology

## 2016-10-10 DIAGNOSIS — E785 Hyperlipidemia, unspecified: Secondary | ICD-10-CM | POA: Diagnosis not present

## 2016-10-10 DIAGNOSIS — K219 Gastro-esophageal reflux disease without esophagitis: Secondary | ICD-10-CM | POA: Diagnosis not present

## 2016-10-10 DIAGNOSIS — I129 Hypertensive chronic kidney disease with stage 1 through stage 4 chronic kidney disease, or unspecified chronic kidney disease: Secondary | ICD-10-CM | POA: Diagnosis not present

## 2016-10-10 DIAGNOSIS — F0390 Unspecified dementia without behavioral disturbance: Secondary | ICD-10-CM | POA: Insufficient documentation

## 2016-10-10 DIAGNOSIS — Z85038 Personal history of other malignant neoplasm of large intestine: Secondary | ICD-10-CM | POA: Insufficient documentation

## 2016-10-10 DIAGNOSIS — F419 Anxiety disorder, unspecified: Secondary | ICD-10-CM | POA: Diagnosis not present

## 2016-10-10 DIAGNOSIS — N183 Chronic kidney disease, stage 3 (moderate): Secondary | ICD-10-CM | POA: Insufficient documentation

## 2016-10-10 DIAGNOSIS — C786 Secondary malignant neoplasm of retroperitoneum and peritoneum: Secondary | ICD-10-CM | POA: Insufficient documentation

## 2016-10-10 DIAGNOSIS — Z87891 Personal history of nicotine dependence: Secondary | ICD-10-CM | POA: Insufficient documentation

## 2016-10-10 HISTORY — PX: CYSTOSCOPY W/ URETERAL STENT PLACEMENT: SHX1429

## 2016-10-10 HISTORY — DX: Urinary tract infection, site not specified: N39.0

## 2016-10-10 LAB — POCT I-STAT 4, (NA,K, GLUC, HGB,HCT)
GLUCOSE: 97 mg/dL (ref 65–99)
HEMATOCRIT: 34 % — AB (ref 36.0–46.0)
HEMOGLOBIN: 11.6 g/dL — AB (ref 12.0–15.0)
POTASSIUM: 4.1 mmol/L (ref 3.5–5.1)
Sodium: 142 mmol/L (ref 135–145)

## 2016-10-10 SURGERY — CYSTOSCOPY, WITH RETROGRADE PYELOGRAM AND URETERAL STENT INSERTION
Anesthesia: Monitor Anesthesia Care | Laterality: Right

## 2016-10-10 MED ORDER — FENTANYL CITRATE (PF) 100 MCG/2ML IJ SOLN
25.0000 ug | INTRAMUSCULAR | Status: DC | PRN
  Filled 2016-10-10: qty 1

## 2016-10-10 MED ORDER — FENTANYL CITRATE (PF) 100 MCG/2ML IJ SOLN
INTRAMUSCULAR | Status: DC | PRN
Start: 2016-10-10 — End: 2016-10-10
  Administered 2016-10-10: 25 ug via INTRAVENOUS

## 2016-10-10 MED ORDER — LACTATED RINGERS IV SOLN
INTRAVENOUS | Status: DC
  Administered 2016-10-10: 13:00:00 via INTRAVENOUS
  Filled 2016-10-10: qty 1000

## 2016-10-10 MED ORDER — CEFAZOLIN SODIUM-DEXTROSE 2-4 GM/100ML-% IV SOLN
INTRAVENOUS | Status: AC
  Filled 2016-10-10: qty 100

## 2016-10-10 MED ORDER — TRAMADOL HCL 50 MG PO TABS: 50.0000 mg | ORAL_TABLET | Freq: Four times a day (QID) | ORAL | 0 refills | Status: DC | PRN

## 2016-10-10 MED ORDER — PROPOFOL 10 MG/ML IV BOLUS
INTRAVENOUS | Status: DC | PRN
  Administered 2016-10-10 (×2): 20 mg via INTRAVENOUS
  Administered 2016-10-10: 40 mg via INTRAVENOUS

## 2016-10-10 MED ORDER — URIBEL 118 MG PO CAPS: 1.0000 | ORAL_CAPSULE | Freq: Two times a day (BID) | ORAL | 3 refills | Status: DC | PRN

## 2016-10-10 MED ORDER — PROPOFOL 10 MG/ML IV BOLUS
INTRAVENOUS | Status: AC
  Filled 2016-10-10: qty 20

## 2016-10-10 MED ORDER — FENTANYL CITRATE (PF) 100 MCG/2ML IJ SOLN
INTRAMUSCULAR | Status: AC
  Filled 2016-10-10: qty 2

## 2016-10-10 MED ORDER — CEFAZOLIN SODIUM-DEXTROSE 2-4 GM/100ML-% IV SOLN
2.0000 g | INTRAVENOUS | Status: AC
  Administered 2016-10-10: 2 g via INTRAVENOUS
  Filled 2016-10-10: qty 100

## 2016-10-10 MED ORDER — LIDOCAINE 2% (20 MG/ML) 5 ML SYRINGE
INTRAMUSCULAR | Status: DC | PRN
  Administered 2016-10-10: 40 mg via INTRAVENOUS

## 2016-10-10 MED ORDER — LIDOCAINE 2% (20 MG/ML) 5 ML SYRINGE
INTRAMUSCULAR | Status: AC
  Filled 2016-10-10: qty 5

## 2016-10-10 SURGICAL SUPPLY — 17 items
BAG DRAIN URO-CYSTO SKYTR STRL (DRAIN) ×6 IMPLANT
CATH INTERMIT  6FR 70CM (CATHETERS) IMPLANT
CLOTH BEACON ORANGE TIMEOUT ST (SAFETY) ×3 IMPLANT
GLOVE BIO SURGEON STRL SZ8 (GLOVE) ×3 IMPLANT
GOWN STRL REUS W/TWL LRG LVL3 (GOWN DISPOSABLE) ×3 IMPLANT
GOWN STRL REUS W/TWL XL LVL3 (GOWN DISPOSABLE) ×3 IMPLANT
GUIDEWIRE ANG ZIPWIRE 038X150 (WIRE) ×3 IMPLANT
GUIDEWIRE STR DUAL SENSOR (WIRE) IMPLANT
INFUSOR MANOMETER BAG 3000ML (MISCELLANEOUS) ×3 IMPLANT
IV NS IRRIG 3000ML ARTHROMATIC (IV SOLUTION) ×3 IMPLANT
KIT RM TURNOVER CYSTO AR (KITS) ×3 IMPLANT
MANIFOLD NEPTUNE II (INSTRUMENTS) IMPLANT
PACK CYSTO (CUSTOM PROCEDURE TRAY) ×3 IMPLANT
STENT POLARIS LOOP 8FR X 24 CM (STENTS) ×3 IMPLANT
SYRINGE 10CC LL (SYRINGE) ×3 IMPLANT
TUBE CONNECTING 12'X1/4 (SUCTIONS)
TUBE CONNECTING 12X1/4 (SUCTIONS) IMPLANT

## 2016-10-10 NOTE — Progress Notes (Signed)
Reported to Dr. Dwyane Luo that patient had some runs of bigeminy, once with movement then they resolve. VS remained stable. Okay per Dr. Royce Macadamia.

## 2016-10-10 NOTE — Anesthesia Preprocedure Evaluation (Addendum)
Anesthesia Evaluation  Patient identified by MRN, date of birth, ID band Patient awake    Reviewed: Allergy & Precautions, NPO status , Patient's Chart, lab work & pertinent test results  Airway Mallampati: II  TM Distance: >3 FB Neck ROM: Full    Dental no notable dental hx.    Pulmonary COPD, former smoker,    breath sounds clear to auscultation       Cardiovascular hypertension,  Rhythm:Regular Rate:Normal     Neuro/Psych PSYCHIATRIC DISORDERS Dementia    GI/Hepatic GERD  ,  Endo/Other    Renal/GU      Musculoskeletal  (+) Arthritis ,   Abdominal   Peds  Hematology   Anesthesia Other Findings   Reproductive/Obstetrics                           Anesthesia Physical Anesthesia Plan  ASA: III  Anesthesia Plan: MAC   Post-op Pain Management:    Induction: Intravenous  PONV Risk Score and Plan: 3 and Ondansetron, Dexamethasone and Propofol infusion  Airway Management Planned: Natural Airway and Simple Face Mask  Additional Equipment:   Intra-op Plan:   Post-operative Plan:   Informed Consent: I have reviewed the patients History and Physical, chart, labs and discussed the procedure including the risks, benefits and alternatives for the proposed anesthesia with the patient or authorized representative who has indicated his/her understanding and acceptance.   Dental advisory given  Plan Discussed with: CRNA  Anesthesia Plan Comments: (And daughter)       Anesthesia Quick Evaluation

## 2016-10-10 NOTE — Anesthesia Procedure Notes (Signed)
Procedure Name: MAC Date/Time: 10/10/2016 3:00 PM Performed by: Bethena Roys T Pre-anesthesia Checklist: Patient identified, Timeout performed, Emergency Drugs available, Suction available and Patient being monitored Patient Re-evaluated:Patient Re-evaluated prior to induction Oxygen Delivery Method: Simple face mask Placement Confirmation: positive ETCO2

## 2016-10-10 NOTE — Op Note (Signed)
Preoperative diagnosis: right malignant obstruction  Postoperative diagnosis: Same  Procedure: 1 cystoscopy 2. right retrograde pyelography 3. Intraoperative fluoroscopy, under one hour, with interpretation 4. right 8x 24JJ stent exchange  Attending: [name]  Anesthesia: General  Estimated blood loss: None  Drains: Right 8x 24JJ polaris ureteral stent without tether  Specimens: none  Antibiotics: ancef  Findings: right mid ureteral narrowing. nohydronephrosis. No masses/lesions in the bladder. Ureteral orifices in normal anatomic location.  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 After discussing treatment options, they decided proceed with stent exchange.  Procedure her in detail: The patient was brought to the operating room and a brief timeout was done to ensure correct patient, correct procedure, correct site. General anesthesia was administered patient was placed in dorsal lithotomy position. Their genitalia was then prepped and draped in usual sterile fashion. A rigid 27 French cystoscope was passed in the urethra and the bladder. Bladder was inspected free masses or lesions. the ureteral orifices were in the normal orthotopic locations. Using a grasper the right stent was brought to the urethral meatus. A sensor wire was advanced through the stent and up to the renal pelvis.a 6 french ureteral catheter was then instilled into the right ureteral orifice. a gentle retrograde was obtained and findings noted above. We then placed a 8x 24double-j ureteral stent over the original sensor wire. We then removed the wire and good coil was noted in the the renal pelvis under fluoroscopy and the bladder under direct vision. the bladder was then drained and this concluded the procedure which was well tolerated by patient.  Complications: None  Condition: Stable,  extubated, transferred to PACU  Plan: The patient will be discharged home. She will followup in 4 weeks with a renal US

## 2016-10-10 NOTE — Anesthesia Postprocedure Evaluation (Signed)
Anesthesia Post Note  Patient: Beth Wilkins  Procedure(s) Performed: Procedure(s) (LRB): CYSTOSCOPY WITH RETROGRADE PYELOGRAM/URETERAL STENT REPLACEMENT (Right)     Patient location during evaluation: PACU Anesthesia Type: MAC Level of consciousness: awake and alert and oriented Pain management: pain level controlled Vital Signs Assessment: post-procedure vital signs reviewed and stable Respiratory status: spontaneous breathing, nonlabored ventilation and respiratory function stable Cardiovascular status: stable and blood pressure returned to baseline Postop Assessment: no signs of nausea or vomiting Anesthetic complications: no    Last Vitals:  Vitals:   10/10/16 1545 10/10/16 1600  BP: 129/65   Pulse: 82 74  Resp: 12 16  Temp: 36.9 C     Last Pain:  Vitals:   10/10/16 1251  TempSrc:   PainSc: 5                  Coden Franchi A.

## 2016-10-10 NOTE — Transfer of Care (Signed)
Immediate Anesthesia Transfer of Care Note  Patient: Beth Wilkins  Procedure(s) Performed: Procedure(s): CYSTOSCOPY WITH RETROGRADE PYELOGRAM/URETERAL STENT REPLACEMENT (Right)  Patient Location: PACU  Anesthesia Type:MAC  Level of Consciousness: drowsy  Airway & Oxygen Therapy: Patient Spontanous Breathing  Post-op Assessment: Report given to RN  Post vital signs: Reviewed and stable  Last Vitals: 127/75, 87, 17, 100, 98.2 Vitals:   10/10/16 1215  BP: 113/74  Pulse: 95  Resp: 17  Temp: 36.8 C    Last Pain:  Vitals:   10/10/16 1251  TempSrc:   PainSc: 5          Complications: No apparent anesthesia complications

## 2016-10-10 NOTE — H&P (Signed)
Urology Admission H&P  Chief Complaint: rigth malignant obstruction  History of Present Illness: Ms Mealor is a 76yo with right malignant ureteral obstruction here for stent exchange  Past Medical History:  Diagnosis Date  . Anxiety   . Arthritis   . Bilateral renal cysts   . Centrilobular emphysema ([place])   . Chronic low back pain   . CKD (chronic kidney disease), stage III   . 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 Complication of anesthesia    can be combative due to dementia  . Full dentures   . History of gastroesophageal reflux (GERD)    changes diet  . History of pelvic fracture   . Hyperlipidemia   . Hypertension   . Liver metastasis ([place])   . 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 Pulmonary nodules   . Right ovarian cyst   . 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 UTI (urinary tract infection) 09/26/2016   5 day antibiotic treatment   Past Surgical History:  Procedure Laterality Date  . CATARACT EXTRACTION W/ INTRAOCULAR LENS  IMPLANT, BILATERAL  2005 approx  . CYSTOSCOPY W/ URETERAL STENT PLACEMENT Right 12/03/2015   Procedure: CYSTOSCOPY WITH RETROGRADE PYELOGRAM/URETERAL STENT PLACEMENT;  Surgeon: Cleon Gustin, MD;  Location: [facility];  Service: Urology;  Laterality: Right;  . CYSTOSCOPY W/ URETERAL STENT PLACEMENT Right 02/15/2016   Procedure: CYSTOSCOPY WITH RETROGRADE PYELOGRAM/URETERAL STENT REPLACEMENT;  Surgeon: Cleon Gustin, MD;  Location: [facility];  Service: Urology;  Laterality: Right;  . CYSTOSCOPY W/ URETERAL STENT PLACEMENT Right 03/28/2016   Procedure: CYSTOSCOPY WITH RETROGRADE PYELOGRAM/URETERAL STENT EXCHANGE;  Surgeon: Cleon Gustin, MD;  Location: [facility];  Service: Urology;  Laterality: Right;  . CYSTOSCOPY W/ URETERAL STENT PLACEMENT Right 07/18/2016   Procedure: CYSTOSCOPY WITH RETROGRADE PYELOGRAM/ STENT EXCHANGE;  Surgeon: Cleon Gustin, MD;  Location: [facility];  Service: Urology;  Laterality: Right;  . CYSTOSCOPY WITH RETROGRADE PYELOGRAM, URETEROSCOPY AND STENT PLACEMENT Right 09/21/2015   Procedure: CYSTOSCOPY WITH BIL RETROGRADE PYELOGRAM, URETEROSCOPY, RIGHT STENT PLACEMENT ;  Surgeon: Cleon Gustin, MD;  Location[facility];  Service: Urology;  Laterality: Right;  . LAPAROSCOPY N/A 01/23/2015   Procedure: DIAGNOSTIC LAPAROSCOPY, EXPLORATORY LAPAROTOMY  RIGHT HEMI-COLECTOMY FOR OBSTRUCTION OF RIGHT TERMINAL ILEUM;  Surgeon: [name], MD;  Location: [facility];  Service: General;  Laterality: N/A;    Home Medications:  Prescriptions Prior to Admission  Medication Sig Dispense Refill Last Dose  . amLODipine (NORVASC) 5 MG tablet Take 5 mg by mouth every morning.    10/10/2016 at 0830  . Cholecalciferol (VITAMIN D3) 2000 UNITS TABS Take by mouth daily.   10/09/2016 at Unknown time  . Coenzyme Q10 (CO Q 10) 100 MG CAPS Take by mouth daily.   10/09/2016 at Unknown time  . donepezil (ARICEPT) 10 MG tablet TAKE 1 TABLET BY MOUTH EVERY DAY 30 tablet 11 10/09/2016 at Unknown time  .  mirabegron ER (MYRBETRIQ) 50 MG TB24 tablet Take 50 mg by mouth daily.   10/10/2016 at 0830  . Multiple Vitamins-Minerals (OCUVITE-LUTEIN PO) Take 1 tablet by mouth daily.   10/09/2016 at Unknown time  . Multiple Vitamins-Minerals (WOMENS MULTIVITAMIN PO) Take 1 tablet by mouth daily.   10/09/2016 at Unknown time  . polyethylene glycol (MIRALAX  / GLYCOLAX) packet Take 17 g by mouth daily. (Patient taking differently: Take 17 g by mouth daily as needed. ) 14 each 0 Past Week at Unknown time  . UNABLE TO FIND Med Name: Closys Mouth Rinse two-three times daily for ulcer.   10/09/2016 at Unknown time  . vitamin B-12 (CYANOCOBALAMIN) 1000 MCG tablet Take 1,000 mcg by mouth daily.   10/09/2016 at Unknown time  . Meth-Hyo-M Bl-Na Phos-Ph Sal (URIBEL) 118 MG CAPS Take 1 capsule (118 mg total) by mouth 2 (two) times daily as needed. 30 capsule 3 10/03/2016  . mirtazapine (REMERON) 15 MG tablet TAKE 1 TABLET BY MOUTH AT BEDTIME 30 tablet 3 Taking  . prochlorperazine (COMPAZINE) 5 MG tablet Take 1 tablet (5 mg total) by mouth every 6 (six) hours as needed for nausea or vomiting. 30 tablet 0 Taking   Allergies:  Allergies  Allergen Reactions  . Namenda [Memantine Hcl] Other (See Comments)    hallucinations  . Oxycodone Other (See Comments)    Hallucinations     Family History  Problem Relation Age of Onset  . Diabetes Mellitus II Father   . Cancer Mother 67       ovarian cancer   . Cervical cancer Other    Social History:  reports that she quit smoking about 20 months ago. Her smoking use included Cigarettes. She has a 12.50 pack-year smoking history. She has never used smokeless tobacco. She reports that she drinks about 1.8 oz of alcohol per week . She reports that she does not use drugs.  Review of Systems  All other systems reviewed and are negative.   Physical Exam:  Vital signs in last 24 hours: Temp:  [98.2 F (36.8 C)] 98.2 F (36.8 C) (08/06 1215) Pulse Rate:  [95] 95 (08/06 1215) Resp:  [17] 17 (08/06 1215) BP: (113)/(74) 113/74 (08/06 1215) SpO2:  [100 %] 100 % (08/06 1215) Weight:  [52.8 kg (116 lb 8 oz)] 52.8 kg (116 lb 8 oz) (08/06 1215) Physical Exam  Constitutional: She is oriented to person, place, and time. She appears well-developed and well-nourished.  HENT:  Head: Normocephalic and atraumatic.  Eyes: Pupils  are equal, round, and reactive to light. EOM are normal.  Neck: Normal range of motion. No thyromegaly present.  Cardiovascular: Normal rate and regular rhythm.   Respiratory: Effort normal. No respiratory distress.  GI: Soft. She exhibits no distension.  Musculoskeletal: Normal range of motion. She exhibits no edema.  Neurological: She is alert and oriented to person, place, and time.  Skin: Skin is warm and dry.  Psychiatric: She has a normal mood and affect. Her behavior is normal. Judgment and thought content normal.    Laboratory Data:  Results for orders placed or performed during the hospital encounter of 10/10/16 (from the past 24 hour(s))  I-STAT 4, (NA,K, GLUC, HGB,HCT)     Status: Abnormal   Collection Time: 10/10/16  1:16 PM  Result Value Ref Range   Sodium 142 135 - 145 mmol/L   Potassium 4.1 3.5 - 5.1 mmol/L   Glucose, Bld 97 65 - 99 mg/dL   HCT 34.0 (L) 36.0 -  46.0 %   Hemoglobin 11.6 (L) 12.0 - 15.0 g/dL   No results found for this or any previous visit (from the past 240 hour(s)). Creatinine:  Recent Labs  10/07/16 0821  CREATININE 1.4*   Baseline Creatinine: 1.4  Impression/Assessment:  75yo with right malignant ureteral obstruction  Plan:  The risks/benefits/alternatives to R Ureteral stent exchange was explained to the patient and she understands and wishes to proceed with surgery  [name] 10/10/2016, 1:48 PM

## 2016-10-10 NOTE — Discharge Instructions (Signed)
°Ureteral Stent Implantation, Care After °Refer to this sheet in the next few weeks. These instructions provide you with information about caring for yourself after your procedure. Your health care provider may also give you more specific instructions. Your treatment has been planned according to current medical practices, but problems sometimes occur. Call your health care provider if you have any problems or questions after your procedure. °What can I expect after the procedure? °After the procedure, it is common to have: °· Nausea. °· Mild pain when you urinate. You may feel this pain in your lower back or lower abdomen. Pain should stop within a few minutes after you urinate. This may last for up to 1 week. °· A small amount of blood in your urine for several days. ° °Follow these instructions at home: ° °Medicines °· Take over-the-counter and prescription medicines only as told by your health care provider. °· If you were prescribed an antibiotic medicine, take it as told by your health care provider. Do not stop taking the antibiotic even if you start to feel better. °· Do not drive for 24 hours if you received a sedative. °· Do not drive or operate heavy machinery while taking prescription pain medicines. °Activity °· Return to your normal activities as told by your health care provider. Ask your health care provider what activities are safe for you. °· Do not lift anything that is heavier than 10 lb (4.5 kg). Follow this limit for 1 week after your procedure, or for as long as told by your health care provider. °General instructions °· Watch for any blood in your urine. Call your health care provider if the amount of blood in your urine increases. °· If you have a catheter: °? Follow instructions from your health care provider about taking care of your catheter and collection bag. °? Do not take baths, swim, or use a hot tub until your health care provider approves. °· Drink enough fluid to keep your urine  clear or pale yellow. °· Keep all follow-up visits as told by your health care provider. This is important. °Contact a health care provider if: °· You have pain that gets worse or does not get better with medicine, especially pain when you urinate. °· You have difficulty urinating. °· You feel nauseous or you vomit repeatedly during a period of more than 2 days after the procedure. °Get help right away if: °· Your urine is dark red or has blood clots in it. °· You are leaking urine (have incontinence). °· The end of the stent comes out of your urethra. °· You cannot urinate. °· You have sudden, sharp, or severe pain in your abdomen or lower back. °· You have a fever. °This information is not intended to replace advice given to you by your health care provider. Make sure you discuss any questions you have with your health care provider. °Document Released: 10/24/2012 Document Revised: 07/30/2015 Document Reviewed: 09/05/2014 °Elsevier Interactive Patient Education © 2018 [employer] ° ° ° °Post Anesthesia Home Care Instructions ° °Activity: °Get plenty of rest for the remainder of the day. A responsible individual must stay with you for 24 hours following the procedure.  °For the next 24 hours, DO NOT: °-Drive a car °-Operate machinery °-Drink alcoholic beverages °-Take any medication unless instructed by your physician °-Make any legal decisions or sign important papers. ° °Meals: °Start with liquid foods such as gelatin or soup. Progress to regular foods as tolerated. Avoid greasy, spicy, heavy foods. If   nausea and/or vomiting occur, drink only clear liquids until the nausea and/or vomiting subsides. Call your physician if vomiting continues. ° °Special Instructions/Symptoms: °Your throat may feel dry or sore from the anesthesia or the breathing tube placed in your throat during surgery. If this causes discomfort, gargle with warm salt water. The discomfort should disappear within 24 hours. ° °If you had a  scopolamine patch placed behind your ear for the management of post- operative nausea and/or vomiting: ° °1. The medication in the patch is effective for 72 hours, after which it should be removed.  Wrap patch in a tissue and discard in the trash. Wash hands thoroughly with soap and water. °2. You may remove the patch earlier than 72 hours if you experience unpleasant side effects which may include dry mouth, dizziness or visual disturbances. °3. Avoid touching the patch. Wash your hands with soap and water after contact with the patch. °  ° °Post Anesthesia Home Care Instructions ° °Activity: °Get plenty of rest for the remainder of the day. A responsible individual must stay with you for 24 hours following the procedure.  °For the next 24 hours, DO NOT: °-Drive a car °-Operate machinery °-Drink alcoholic beverages °-Take any medication unless instructed by your physician °-Make any legal decisions or sign important papers. ° °Meals: °Start with liquid foods such as gelatin or soup. Progress to regular foods as tolerated. Avoid greasy, spicy, heavy foods. If nausea and/or vomiting occur, drink only clear liquids until the nausea and/or vomiting subsides. Call your physician if vomiting continues. ° °Special Instructions/Symptoms: °Your throat may feel dry or sore from the anesthesia or the breathing tube placed in your throat during surgery. If this causes discomfort, gargle with warm salt water. The discomfort should disappear within 24 hours. ° °If you had a scopolamine patch placed behind your ear for the management of post- operative nausea and/or vomiting: ° °1. The medication in the patch is effective for 72 hours, after which it should be removed.  Wrap patch in a tissue and discard in the trash. Wash hands thoroughly with soap and water. °2. You may remove the patch earlier than 72 hours if you experience unpleasant side effects which may include dry mouth, dizziness or visual disturbances. °3. Avoid  touching the patch. Wash your hands with soap and water after contact with the patch. °  ° ° °

## 2016-10-11 ENCOUNTER — Encounter (HOSPITAL_BASED_OUTPATIENT_CLINIC_OR_DEPARTMENT_OTHER): Payer: Self-pay | Admitting: Urology

## 2016-10-18 MED FILL — URO-MP CAPSULE: 118 | 15 days supply | Qty: 30 | Fill #2

## 2016-10-20 MED FILL — MIRTAZAPINE 15 MG TABLET: 15 | 30 days supply | Qty: 30 | Fill #1

## 2016-10-21 ENCOUNTER — Ambulatory Visit (HOSPITAL_BASED_OUTPATIENT_CLINIC_OR_DEPARTMENT_OTHER): Payer: Medicare Other

## 2016-10-21 ENCOUNTER — Telehealth: Payer: Self-pay | Admitting: *Deleted

## 2016-10-21 ENCOUNTER — Other Ambulatory Visit (HOSPITAL_BASED_OUTPATIENT_CLINIC_OR_DEPARTMENT_OTHER): Payer: Medicare Other

## 2016-10-21 VITALS — BP 111/65 | HR 94 | Temp 98.7°F | Resp 16 | Wt 117.7 lb

## 2016-10-21 DIAGNOSIS — C180 Malignant neoplasm of cecum: Secondary | ICD-10-CM

## 2016-10-21 DIAGNOSIS — C182 Malignant neoplasm of ascending colon: Secondary | ICD-10-CM

## 2016-10-21 DIAGNOSIS — C786 Secondary malignant neoplasm of retroperitoneum and peritoneum: Secondary | ICD-10-CM | POA: Diagnosis not present

## 2016-10-21 DIAGNOSIS — Z5112 Encounter for antineoplastic immunotherapy: Secondary | ICD-10-CM | POA: Diagnosis not present

## 2016-10-21 DIAGNOSIS — Z5111 Encounter for antineoplastic chemotherapy: Secondary | ICD-10-CM | POA: Diagnosis not present

## 2016-10-21 LAB — CBC WITH DIFFERENTIAL/PLATELET
BASO%: 0.8 % (ref 0.0–2.0)
BASOS ABS: 0.1 10*3/uL (ref 0.0–0.1)
EOS ABS: 0.1 10*3/uL (ref 0.0–0.5)
EOS%: 1.7 % (ref 0.0–7.0)
HCT: 36.1 % (ref 34.8–46.6)
HGB: 11.4 g/dL — ABNORMAL LOW (ref 11.6–15.9)
LYMPH%: 11.9 % — AB (ref 14.0–49.7)
MCH: 28.4 pg (ref 25.1–34.0)
MCHC: 31.7 g/dL (ref 31.5–36.0)
MCV: 89.5 fL (ref 79.5–101.0)
MONO#: 0.5 10*3/uL (ref 0.1–0.9)
MONO%: 6.9 % (ref 0.0–14.0)
NEUT#: 6.1 10*3/uL (ref 1.5–6.5)
NEUT%: 78.7 % — AB (ref 38.4–76.8)
Platelets: 400 10*3/uL (ref 145–400)
RBC: 4.03 10*6/uL (ref 3.70–5.45)
RDW: 19.1 % — ABNORMAL HIGH (ref 11.2–14.5)
WBC: 7.8 10*3/uL (ref 3.9–10.3)
lymph#: 0.9 10*3/uL (ref 0.9–3.3)

## 2016-10-21 LAB — COMPREHENSIVE METABOLIC PANEL
ALT: 10 U/L (ref 0–55)
AST: 17 U/L (ref 5–34)
Albumin: 3.3 g/dL — ABNORMAL LOW (ref 3.5–5.0)
Alkaline Phosphatase: 75 U/L (ref 40–150)
Anion Gap: 8 mEq/L (ref 3–11)
BUN: 15.9 mg/dL (ref 7.0–26.0)
CO2: 27 meq/L (ref 22–29)
Calcium: 9.6 mg/dL (ref 8.4–10.4)
Chloride: 108 mEq/L (ref 98–109)
Creatinine: 1.1 mg/dL (ref 0.6–1.1)
EGFR: 56 mL/min/{1.73_m2} — AB (ref >90)
GLUCOSE: 75 mg/dL (ref 70–140)
POTASSIUM: 4.4 meq/L (ref 3.5–5.1)
SODIUM: 143 meq/L (ref 136–145)
Total Bilirubin: 0.25 mg/dL (ref 0.20–1.20)
Total Protein: 7.6 g/dL (ref 6.4–8.3)

## 2016-10-21 MED ORDER — DEXAMETHASONE SODIUM PHOSPHATE 10 MG/ML IJ SOLN
INTRAMUSCULAR | Status: AC
  Filled 2016-10-21: qty 1

## 2016-10-21 MED ORDER — SODIUM CHLORIDE 0.9 % IV SOLN
Freq: Once | INTRAVENOUS | Status: AC
  Administered 2016-10-21: 12:00:00 via INTRAVENOUS

## 2016-10-21 MED ORDER — BEVACIZUMAB CHEMO INJECTION 400 MG/16ML
5.0000 mg/kg | Freq: Once | INTRAVENOUS | Status: AC
  Administered 2016-10-21: 275 mg via INTRAVENOUS
  Filled 2016-10-21: qty 11

## 2016-10-21 MED ORDER — DEXAMETHASONE SODIUM PHOSPHATE 10 MG/ML IJ SOLN
10.0000 mg | Freq: Once | INTRAMUSCULAR | Status: AC
  Administered 2016-10-21: 10 mg via INTRAVENOUS

## 2016-10-21 MED ORDER — PALONOSETRON HCL INJECTION 0.25 MG/5ML
0.2500 mg | Freq: Once | INTRAVENOUS | Status: AC
  Administered 2016-10-21: 0.25 mg via INTRAVENOUS

## 2016-10-21 MED ORDER — ATROPINE SULFATE 1 MG/ML IJ SOLN: 0.5000 mg | Freq: Once | INTRAMUSCULAR | Status: DC | PRN

## 2016-10-21 MED ORDER — PALONOSETRON HCL INJECTION 0.25 MG/5ML
INTRAVENOUS | Status: AC
Start: 2016-10-21 — End: 2016-10-21
  Filled 2016-10-21: qty 5

## 2016-10-21 MED ORDER — IRINOTECAN HCL CHEMO INJECTION 100 MG/5ML
150.0000 mg/m2 | Freq: Once | INTRAVENOUS | Status: AC
  Administered 2016-10-21: 240 mg via INTRAVENOUS
  Filled 2016-10-21: qty 10

## 2016-10-21 NOTE — Telephone Encounter (Signed)
Received call from daughter Lorriane Shire wanting to inform re:  Pt has chemo appt today.  Pt has dementia.  Lorriane Shire will try her best to get pt in for lab and chemo appt today; however, pt has hallucinations, will not get dressed, will not get in the car due to her dementia.  Lorriane Shire will let nurse know whether pt can come in or not. [facility]   Phone     [phone]    OR     [phone].

## 2016-10-21 NOTE — Patient Instructions (Signed)
[place] Discharge Instructions for Patients Receiving Chemotherapy  Today you received the following chemotherapy agents: Irinotecan and Avastin   To help prevent nausea and vomiting after your treatment, we encourage you to take your nausea medication as directed.  If you develop nausea and vomiting that is not controlled by your nausea medication, call the clinic.   BELOW ARE SYMPTOMS THAT SHOULD BE REPORTED IMMEDIATELY:  *FEVER GREATER THAN 100.5 F  *CHILLS WITH OR WITHOUT FEVER  NAUSEA AND VOMITING THAT IS NOT CONTROLLED WITH YOUR NAUSEA MEDICATION  *UNUSUAL SHORTNESS OF BREATH  *UNUSUAL BRUISING OR BLEEDING  TENDERNESS IN MOUTH AND THROAT WITH OR WITHOUT PRESENCE OF ULCERS  *URINARY PROBLEMS  *BOWEL PROBLEMS  UNUSUAL RASH Items with * indicate a potential emergency and should be followed up as soon as possible.  Feel free to call the clinic you have any questions or concerns. The clinic phone number is [phone].  Please show the [place] at check-in to the Emergency Department and triage nurse.

## 2016-10-26 MED FILL — MYRBETRIQ ER 25 MG TABLET: 25 | 30 days supply | Qty: 30 | Fill #1

## 2016-10-28 NOTE — Progress Notes (Signed)
Beth Wilkins  [phone] [phone]) [phone]  Clinic Follow Up Note   Patient Care Team: [name], MD as PCP - General (Internal Medicine) [name], MD as Consulting Physician (Medical Oncology) [name], MD as Consulting Physician (General Surgery) [name], MD as Consulting Physician (Orthopedic Surgery) [name] [name], MD as Consulting Physician (Urology) 11/04/2016    CHIEF COMPLAINTS:  Follow up metastatic colon cancer  Oncology History   Cancer of right colon [facility])   Staging form: Colon and Rectum, AJCC 7th Edition     Pathologic stage from 01/23/2015: Stage IIC (T4b, N0, cM0) - Signed by [name], MD on 02/05/2015       Cancer of right colon ([place])   01/21/2015 Imaging     CT abdomen and pelvis showed high-grade small bowel obstruction,  right I [place] ureteral nephrosis,  multiple liver cysts.  CT chest was negative for metastatic lesion.      01/23/2015 Pathology Results     invasive adenocarcinoma extending into the pericecal connective tissue,  grade 2, LVI (-),  no perineural invasion, 3 lymph nodes were negative.      01/23/2015 Surgery      terminal ileum and cecum seegmental resection with anastomosis by Dr. Hassell Done      01/23/2015 Initial Diagnosis    Cancer of right colon ([place])      03/13/2015 - 07/06/2015 Chemotherapy    Xeloda 1500 mg bid X 14 days on-7 off, s/p 5 cycles, stopped early due to wrosening renal function      10/28/2015 Progression    PET scan showed multiple hypermetabolic peritoneal nodules, right ovarian cystic mass, and hypermetabolic liver lesion, highly suspicious for metastatic colon cancer. Small pulmonary nodules are indeterminate.       11/11/2015 - 05/20/2016 Chemotherapy    Xeloda 152m bid, 2 weeks on and one week off, Avastin was added on from cycle 3. Xeloda held from 05/20/16 due to progression on CT from 05/19/16 and total bilirubin increased to 2.6.      01/08/2016 Imaging   Ct chest, Abdomen Pelvis Wo Contrast 01/08/2016  IMPRESSION: 1. 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 2. Hepatic and renal cysts. 3.  Aortoiliac atherosclerotic vascular disease. 4. Lumbar spondylosis and degenerative disc disease with multilevel Impingement.      05/19/2016 Imaging    CT CAP w Contrast IMPRESSION: 1. Numerous small solid pulmonary nodules, one of which is new, and several of which have mildly increased in size since 10/28/2015, worrisome for mild progression of pulmonary metastases. 2. Solitary right liver lobe metastasis is mildly increased in size since 01/08/2016. 3. Peritoneal metastases have mildly increased in size since 01/08/2016, largest in the right pelvic sidewall. 4. Well-positioned right nephroureteral stent without overt right hydronephrosis. 5. Additional findings include aortic atherosclerosis, 1 vessel coronary atherosclerosis and mild to moderate emphysema.      06/03/2016 -  Chemotherapy    Irinotecan and Avastin every 2 weeks starting 06/03/16      07/29/2016 Tumor Marker    CEA 6.51       08/09/2016 Imaging    PET  IMPRESSION: 1. Overall mild improvement compared to FDG PET scan of 10/28/2015. 2. Pulmonary nodules are increased in size from PET-CT scan and similar size to most recent CT scan. Largest lesion RIGHT lower lobe does have associated faint metabolic activity consistent with pulmonary metastasis. 3. Decrease in metabolic activity of RIGHT hepatic lobe lesion. Potential second lesion  LEFT hepatic lobe with slightly lower metabolic activity. 4. Decreased metabolic activity at the enteric colonic anastomosis in the RIGHT lower quadrant. 5. Interval decrease in metabolic activity of peritoneal nodular metastasis. 6. Nodular implant implant along ventral abdominal wall may be within the musculature, but also improved.       HISTORY OF PRESENTING ILLNESS  (02/05/2015):  Beth Wilkins 76 y.o. female  with past medical history of moderate dementia, hypertension, is here because of recently diagnosed stage II colon cancer. She is accompanied to the clinic by her daughter. History is mainly obtained from the chart and her daughter.  She has had abdominal discomfort, nausea and vomiting, and norexia for  the past to 3 months along with 15 pounds weight loss, . Her nausea was mild and intermittent, she does not seek medical attention initially. She presented with  worsening symptoms for 2-3 weeks, and episodes of projectile vomiting and dehydration to emergency room, was admitted to [facility] on 01/20/2015. CT scan reviewed small bowel obstruction, and possible appendiceal rupture. She was brought to OR on 01/23/2015, underwent right hemicolectomy with anastomosis by Dr. Hassell Done. Surgical path reviewed adenocarcinoma at the cecum. She was discharged home on 01/26/2015.  She has recovered well, eating much better than prior to surgery, move around without restriction. No pain, she has loose BM 3 times a day, and she has backed off her mirealax.  she lives with her daughter. She states her appetite is moderate, sometime low, and she refuses to eat if she doesn't have appetite. No nausea, vomiting, abdominal bloating since her surgery.  Her last colonoscopy was 6-8 years ago, and her stool OB was negative this year.   CURRENT THERAPY: Irinotecan and Avastin every 2 weeks started 06/03/16  INTERIM HISTORY:  Beth Wilkins presents to the clinic today accompanied by her daughter for follow up and cycle treatment. Overall things are going well for her and she recovered from her last treatment well. Her daughter reports that her appetite has been improving and she has been eating well. Her daughter has also been encouraging her to drink more water, stating that she has been drinking more teas and she has been drinking 3 bottles of water a day. Her daughter does note that  following her treatments she will have short bouts of diarrhea, which will resolve by her daughter increasing her fiber consumption. Her daughter denies any constipation, changes in bowel movements otherwise, or any other associated symptoms.   MEDICAL HISTORY:  Past Medical History:  Diagnosis Date  . Anxiety   . Arthritis   . Bilateral renal cysts   . Centrilobular emphysema ([place])   . Chronic low back pain   . CKD (chronic kidney disease), stage III   . Colon cancer [facility] oncologist-  dr [name]   dx 01-23-2015  right colon Invasive adenocarcinoma into pericecal connective tissue and involving small intestine , Stage IIc, Grade 2, LVI(-),  (pT4b N0 M0) /  s/p resection termial ileum and cecum and chemotherapy 01-06-217 to 07-06-2015/   08/ 2017  per PET  recurrent w/ liver mets and peritoneal carcinomatosis  . Complication of anesthesia    can be combative due to dementia  . Full dentures   . History of gastroesophageal reflux (GERD)    changes diet  . History of pelvic fracture   . Hyperlipidemia   . Hypertension   . Liver metastasis ([place])   . Moderate dementia without behavioral disturbance    moderate to severe dementia per  neurologist note (dr Krista Blue) in epic  . Pulmonary nodules   . Right ovarian cyst   . 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 UTI (urinary tract infection) 09/26/2016   5 day antibiotic treatment    SURGICAL HISTORY: Past Surgical History:  Procedure Laterality Date  . CATARACT EXTRACTION W/ INTRAOCULAR LENS  IMPLANT, BILATERAL  2005 approx  . CYSTOSCOPY W/ URETERAL STENT PLACEMENT Right 12/03/2015   Procedure: CYSTOSCOPY WITH RETROGRADE PYELOGRAM/URETERAL STENT PLACEMENT;  Surgeon: Cleon Gustin, MD;  Location: [facility];  Service: Urology;  Laterality: Right;  . CYSTOSCOPY W/ URETERAL STENT PLACEMENT Right 02/15/2016    Procedure: CYSTOSCOPY WITH RETROGRADE PYELOGRAM/URETERAL STENT REPLACEMENT;  Surgeon: Cleon Gustin, MD;  Location: [facility];  Service: Urology;  Laterality: Right;  . CYSTOSCOPY W/ URETERAL STENT PLACEMENT Right 03/28/2016   Procedure: CYSTOSCOPY WITH RETROGRADE PYELOGRAM/URETERAL STENT EXCHANGE;  Surgeon: Cleon Gustin, MD;  Location: [facility];  Service: Urology;  Laterality: Right;  . CYSTOSCOPY W/ URETERAL STENT PLACEMENT Right 07/18/2016   Procedure: CYSTOSCOPY WITH RETROGRADE PYELOGRAM/ STENT EXCHANGE;  Surgeon: Cleon Gustin, MD;  Location: [facility];  Service: Urology;  Laterality: Right;  . CYSTOSCOPY W/ URETERAL STENT PLACEMENT Right 10/10/2016   Procedure: CYSTOSCOPY WITH RETROGRADE PYELOGRAM/URETERAL STENT REPLACEMENT;  Surgeon: Cleon Gustin, MD;  Location: [facility];  Service: Urology;  Laterality: Right;  . CYSTOSCOPY WITH RETROGRADE PYELOGRAM, URETEROSCOPY AND STENT PLACEMENT Right 09/21/2015   Procedure: CYSTOSCOPY WITH BIL RETROGRADE PYELOGRAM, URETEROSCOPY, RIGHT STENT PLACEMENT ;  Surgeon: Cleon Gustin, MD;  Location: [facility];  Service: Urology;  Laterality: Right;  . LAPAROSCOPY N/A 01/23/2015   Procedure: DIAGNOSTIC LAPAROSCOPY, EXPLORATORY LAPAROTOMY  RIGHT HEMI-COLECTOMY FOR OBSTRUCTION OF RIGHT TERMINAL ILEUM;  Surgeon: [name], MD;  Location: [facility];  Service: General;  Laterality: N/A;    SOCIAL HISTORY: Social History   Social History  . Marital status: Single    Spouse name: N/A  . Number of children: N/A  . Years of education: N/A   Occupational History  . Not on file.   Social History Main Topics  . Smoking status: Former Smoker    Packs/day: 0.25    Years: 50.00    Types: Cigarettes    Quit date: 01/25/2015  . Smokeless tobacco: Never Used  . Alcohol use 1.8 oz/week    2 Glasses of wine, 1 Cans of beer per week     Comment: occasional    . Drug use: No  . Sexual activity: Not on file   Other Topics Concern  . Not on file   Social History Narrative   Legally separated   Lives w/daughter, Hazelene Doten   Has total of #2 daughters and #1 son   Dementia   Fall Risk-ambulates w/cane    FAMILY HISTORY: Family History  Problem Relation Age of Onset  . Diabetes Mellitus II Father   . Cancer Mother 31       ovarian cancer   . Cervical cancer Other     ALLERGIES:  is allergic to namenda [memantine hcl] and oxycodone.  MEDICATIONS:  Current Outpatient Prescriptions  Medication Sig Dispense Refill  . amLODipine (NORVASC) 5 MG tablet Take 5 mg by mouth every morning.     . Cholecalciferol (VITAMIN D3) 2000 UNITS TABS Take by mouth daily.    Marland Kitchen  Coenzyme Q10 (CO Q 10) 100 MG CAPS Take by mouth daily.    Marland Kitchen donepezil (ARICEPT) 10 MG tablet TAKE 1 TABLET BY MOUTH EVERY DAY 30 tablet 11  . Meth-Hyo-M Bl-Na Phos-Ph Sal (URIBEL) 118 MG CAPS Take 1 capsule (118 mg total) by mouth 2 (two) times daily as needed. 30 capsule 3  . Meth-Hyo-M Bl-Na Phos-Ph Sal (URIBEL) 118 MG CAPS Take 1 capsule (118 mg total) by mouth 2 (two) times daily as needed. 30 capsule 3  . mirabegron ER (MYRBETRIQ) 50 MG TB24 tablet Take 50 mg by mouth daily.    . mirtazapine (REMERON) 15 MG tablet TAKE 1 TABLET BY MOUTH AT BEDTIME 30 tablet 3  . Multiple Vitamins-Minerals (OCUVITE-LUTEIN PO) Take 1 tablet by mouth daily.    . Multiple Vitamins-Minerals (WOMENS MULTIVITAMIN PO) Take 1 tablet by mouth daily.    . polyethylene glycol (MIRALAX / GLYCOLAX) packet Take 17 g by mouth daily. (Patient taking differently: Take 17 g by mouth daily as needed. ) 14 each 0  . prochlorperazine (COMPAZINE) 5 MG tablet Take 1 tablet (5 mg total) by mouth every 6 (six) hours as needed for nausea or vomiting. 30 tablet 0  . traMADol (ULTRAM) 50 MG tablet Take 1 tablet (50 mg total) by mouth every 6 (six) hours as needed. 30 tablet 0  . UNABLE TO FIND Med Name: Closys Mouth  Rinse two-three times daily for ulcer.    . vitamin B-12 (CYANOCOBALAMIN) 1000 MCG tablet Take 1,000 mcg by mouth daily.     No current facility-administered medications for this visit.     REVIEW OF SYSTEMS: Constitutional: Denies fevers, chills or abnormal night sweats  Eyes: Denies blurriness of vision, double vision or watery eyes  Ears, nose, mouth, throat, and face: Denies mucositis or sore throat Respiratory: Denies cough, dyspnea or wheezes Cardiovascular: Denies palpitation, chest discomfort or lower extremity swelling Gastrointestinal:  Denies heartburn  Skin: Denies abnormal skin rashes Lymphatics: Denies new lymphadenopathy or easy bruising Neurological:Denies numbness, tingling or new weaknesses Behavioral/Psych: Mood is stable, no new changes (+) Dementia All other systems were reviewed with the patient and are negative.  PHYSICAL EXAMINATION:  ECOG PERFORMANCE STATUS: 1 - Symptomatic but completely ambulatory  Vitals:   11/04/16 0941  BP: (!) 159/78  Pulse: 77  Resp: 18  Temp: 97.8 F (36.6 C)  SpO2: 100%   Filed Weights   11/04/16 0941  Weight: 120 lb 8 oz (54.7 kg)     GENERAL:alert, no distress and comfortable SKIN: skin color, texture, turgor are normal, no rashes or significant lesions EYES: normal, conjunctiva are pink and non-injected, sclera clear (+) difficult to evaluate due to her dementia OROPHARYNX:no exudate and tongue normal (+) Upper and lower dentures  NECK: supple, thyroid normal size, non-tender, without nodularity LYMPH:  no palpable lymphadenopathy in the cervical, axillary or inguinal LUNGS: clear to auscultation and percussion with normal breathing effort HEART: regular rate & rhythm and no murmurs and no lower extremity edema ABDOMEN:abdomen soft, non-tender and normal bowel sounds  Musculoskeletal:no cyanosis of digits and no clubbing  PSYCH: alert and with fluent speech, (+) dementia  NEURO: no focal motor/sensory  deficits  LABORATORY DATA:  I have reviewed the data as listed CBC Latest Ref Rng & Units 11/04/2016 10/21/2016 10/10/2016  WBC 3.9 - 10.3 10e3/uL 6.7 7.8 -  Hemoglobin 11.6 - 15.9 g/dL 10.9(L) 11.4(L) 11.6(L)  Hematocrit 34.8 - 46.6 % 34.3(L) 36.1 34.0(L)  Platelets 145 - 400 10e3/uL 345 400 -  CMP Latest Ref Rng & Units 11/04/2016 10/21/2016 10/10/2016  Glucose 70 - 140 mg/dl 83 75 97  BUN 7.0 - 26.0 mg/dL 12.7 15.9 -  Creatinine 0.6 - 1.1 mg/dL 1.1 1.1 -  Sodium 136 - 145 mEq/L 140 143 142  Potassium 3.5 - 5.1 mEq/L 3.9 4.4 4.1  Chloride 101 - 111 mmol/L - - -  CO2 22 - 29 mEq/L 22 27 -  Calcium 8.4 - 10.4 mg/dL 9.2 9.6 -  Total Protein 6.4 - 8.3 g/dL 7.2 7.6 -  Total Bilirubin 0.20 - 1.20 mg/dL 0.54 0.25 -  Alkaline Phos 40 - 150 U/L 69 75 -  AST 5 - 34 U/L 18 17 -  ALT 0 - 55 U/L 13 10 -    CEA  01/23/2015: 5.7 06/05/2015: 6.1 08/04/2015: 10.3 10/12/2015: 13.29 (in-house)  11/04/2015: 12.83 01/13/2016: 8.57 02/24/2016: 8.41 04/27/2016: 9.22 05/20/2016: 8.32 06/17/2016: 6.45 07/15/2016: 5.07 07/29/2016: 6.51 09/09/16: 7.47 10/07/16: 7.85 11/04/16: PENDING  PATHOLOGY REPORT: Diagnosis 01/23/2015 Colon, segmental resection, with terminal ileum and cecum - INVASIVE ADENOCARCINOMA EXTENDING INTO PERICECAL CONNECTIVE TISSUE AND INVOLVING SMALL INTESTINE. - THREE BENIGN LYMPH NODES (0/3). - SEE ONCOLOGY TABLE BELOW.  Microscopic Comment COLON AND RECTUM (INCLUDING TRANS-ANAL RESECTION): Specimen: Terminal ileum and cecum. Procedure: Resection. Tumor site: Cecum adjacent to appendix. Specimen integrity: Intact. Macroscopic intactness of mesorectum: Not applicable. Macroscopic tumor perforation: No. Invasive tumor: Maximum size: 4.0 cm. Histologic type(s): Colorectal adenocarcinoma. Histologic grade and differentiation: G2: moderately differentiated/low grade. Type of polyp in which invasive carcinoma arose: No residual polyp. Microscopic extension of invasive tumor: Into  pericecal connective tissue and terminal ileum. Lymph-Vascular invasion: Not identified. Peri-neural invasion: Not identified. Tumor deposit(s) (discontinuous extramural extension): No. Resection margins: Proximal margin: Free of tumor. Distal margin: Free of tumor. Circumferential (radial) (posterior ascending, posterior descending; lateral and posterior mid-rectum; and entire lower 1/3 rectum): Free of tumor. Mesenteric margin (sigmoid and transverse): N/A. Distance closest margin (if all above margins negative): N/A. Treatment effect (neo-adjuvant therapy): No. Additional polyp(s): No. Non-neoplastic findings: N/A. Lymph nodes: number examined 3; number positive: 0. Pathologic Staging: pT4b, pN0, pMX. Ancillary studies: Mismatch repair protein by immunohistochemistry pending. Comments: There is a colorectal-type adenocarcinoma arising in the cecum in the area of the appendiceal orifice. The tumor extends into the pericecal connective tissue, and involves the attached segment of terminal ileum. Immunohistochemistry shows the tumor is positive with CDX2 and cytokeratin 20, and negative with cytokeratin 7, estrogen receptor and WT-1 supporting a diagnosis of primary colorectal adenocarcinoma. ADDITIONAL INFORMATION: Mismatch Repair (MMR) Protein Immunohistochemistry (IHC) IHC Expression Result: MLH1: Preserved nuclear expression (greater 50% tumor expression) MSH2: Preserved nuclear expression (greater 50% tumor expression) MSH6: Preserved nuclear expression (greater 50% tumor expression) PMS2: Preserved nuclear expression (greater 50% tumor expression) * Internal control demonstrates intact nuclear expression Interpretation: NORMAL There is preserved expression of the major and minor MMR proteins. There is a very low probability that microsatellite instability (MSI) is present. However, certain clinically significant MMR protein mutations may result in preservation of nuclear  expression. It is recommended that the preservation of protein expression be correlated with molecular based MSI testing.    RADIOGRAPHIC STUDIES: I have personally reviewed the radiological images as listed and agreed with the findings in the report.  PET 08/09/2016 IMPRESSION: 1. Overall mild improvement compared to FDG PET scan of 10/28/2015. 2. Pulmonary nodules are increased in size from PET-CT scan and similar size to most recent CT scan. Largest lesion RIGHT lower lobe does have associated faint  metabolic activity consistent with pulmonary metastasis. 3. Decrease in metabolic activity of RIGHT hepatic lobe lesion. Potential second lesion LEFT hepatic lobe with slightly lower metabolic activity. 4. Decreased metabolic activity at the enteric colonic anastomosis in the RIGHT lower quadrant. 5. Interval decrease in metabolic activity of peritoneal nodular metastasis. 6. Nodular implant implant along ventral abdominal wall may be within the musculature, but also improved.  CT CAP w Contrast 05/19/2016 IMPRESSION: 1. Numerous small solid pulmonary nodules, one of which is new, and several of which have mildly increased in size since 10/28/2015, worrisome for mild progression of pulmonary metastases. 2. Solitary right liver lobe metastasis is mildly increased in size since 01/08/2016. 3. Peritoneal metastases have mildly increased in size since 01/08/2016, largest in the right pelvic sidewall. 4. Well-positioned right nephroureteral stent without overt right hydronephrosis. 5. Additional findings include aortic atherosclerosis, 1 vessel coronary atherosclerosis and mild to moderate emphysema.  Ct chest, Abdomen Pelvis Wo Contrast 01/08/2016  IMPRESSION: 1. 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 2. Hepatic and renal cysts. 3.  Aortoiliac atherosclerotic vascular disease. 4. Lumbar spondylosis and degenerative  disc disease with multilevel impingement.  ASSESSMENT & PLAN: 76 y.o. African-American female, with past medical history of moderate dementia, independent with ADLs, hypertension, presents with small bowel obstruction and weight loss.  1. 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 -I personally reviewed her recent PET scan images, unfortunately the scan showed hypermetabolic peritoneal and liver metastases. 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 -She started Xeloda '1500mg'$  bid, 2 weeks on and one week off. 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 The tumor has MSI stable, she is not a candidate for immunotherapy, although she may be a candidate for clinical trial of immunotherapy plus MEK inhibitor at [facility]. -I previously reviewed her restaging CT scan from 01/08/2016, which showed overall stable disease, no new lesions. We'll continue her chemotherapy. -I previously reviewed the CT from 05/19/2016 with the patient and her daughter in detail. She has had a moderate disease progression in lungs, liver and peritoneum. I recommended her to change treatment. -she is now on second line single agent rinotecan at a reduced dose '150mg'$ /m2, tolerated well, will continue. -The goal of therapy is palliative, mainly to improve her quality of life. -If she develops poor tolerance to chemotherapy, or further disease  progression on next scan, I will likely stop chemotherapy and change to palliative care alone, due to her advanced age and comorbidities, especially dementia. - PET Scan image from 08/09/16 reviewed in person, and I gave her a copy of the report. She has good partial response to treatment. We will continue irinotecan and Avastin. -We previously  discussed pt being able to take chemo break and she will notify us if needed  - Due to her daughter's wedding on Sept 29, I will postpone her sept 28 appointment -Labs reviewed today 11/04/16, all stable. Renal function has  improved. We will continue with her treatment today and she will have a scans performed before her next f/u next month.   2. CKD stage III, right hydroureter -Her Cr was around 1 in 08/2013, Cr increased to 2.4 on 01/20/2015 when she was diagnosed with colon cancer due to dehydration, improved some and moved around1.3-1.5 .  -A previous CT scan showed right hydroureter, which probably contributes to her renal insufficiency. -she had ureter stent placement again in 12/2015, 03/2016, and 04/2016 -Patient had a stent replacement on 07/19/2015 - Cr has improved some lately -Previously underwent renal stent exchange.   3. HTN, dementia -She will continue her medication and follow-up with her primary care physician. -Her BP has been better controlled lately, we discussed that Avastin can cause hypertension, we'll continue monitoring closely. -Her dementia has been getting worse gradually, which has increased her care buren on her daughter. I previously discussed burn-out prevention with her daughter, she appreciated and has planned to have a care-giver for her mother   55. Nausea and diarrhea -Secondary to chemotherapy, overall very mild and manageable  -She is to take Imodium PRN and her nausea medications. -Overall improved.   8. Goal of care discussion  -We previously discussed the incurable nature of her cancer, and the overall poor  prognosis, especially if she has progress on chemo -The patient understands the goal of care is palliative. -Due to her advanced age and comorbidities, especially dementia, I have low threshold to switch her to palliative care alone if she does not tolerate chemo well or dementia gets worse, her daughter is on -board  -I recommend DNR/DNI, pt and her daughter agrees.    Plan Labs reviewed, stable for treatment today, will proceed.  -Labs, flush, and chemo irinotecan and avatin in 2, 4 and 6 weeks.  -F/u in 4wks with scan prior.   All questions were answered. The patient knows to call the clinic with any problems, questions or concerns.  I spent 20 minutes counseling the patient face to face. The total time spent in the appointment was 25 minutes and more than 50% was on counseling.  This document serves as a record of services personally performed by [name], MD. It was created on her behalf by Reola Mosher, a trained medical scribe. The creation of this record is based on the scribe's personal observations and the provider's statements to them. This document has been checked and approved by the attending provider.  [name]  11/04/2016

## 2016-11-02 MED FILL — AMLODIPINE BESYLATE 5 MG TA: 5 | 30 days supply | Qty: 30 | Fill #0

## 2016-11-04 ENCOUNTER — Ambulatory Visit (HOSPITAL_BASED_OUTPATIENT_CLINIC_OR_DEPARTMENT_OTHER): Payer: Medicare Other

## 2016-11-04 ENCOUNTER — Telehealth: Payer: Self-pay | Admitting: Hematology

## 2016-11-04 ENCOUNTER — Other Ambulatory Visit (HOSPITAL_BASED_OUTPATIENT_CLINIC_OR_DEPARTMENT_OTHER): Payer: Medicare Other

## 2016-11-04 ENCOUNTER — Ambulatory Visit (HOSPITAL_BASED_OUTPATIENT_CLINIC_OR_DEPARTMENT_OTHER): Payer: Medicare Other | Admitting: Hematology

## 2016-11-04 VITALS — BP 159/78 | HR 77 | Temp 97.8°F | Resp 18 | Ht 66.0 in | Wt 120.5 lb

## 2016-11-04 DIAGNOSIS — F0390 Unspecified dementia without behavioral disturbance: Secondary | ICD-10-CM

## 2016-11-04 DIAGNOSIS — C786 Secondary malignant neoplasm of retroperitoneum and peritoneum: Secondary | ICD-10-CM

## 2016-11-04 DIAGNOSIS — Z5111 Encounter for antineoplastic chemotherapy: Secondary | ICD-10-CM

## 2016-11-04 DIAGNOSIS — C180 Malignant neoplasm of cecum: Secondary | ICD-10-CM

## 2016-11-04 DIAGNOSIS — D638 Anemia in other chronic diseases classified elsewhere: Secondary | ICD-10-CM

## 2016-11-04 DIAGNOSIS — C182 Malignant neoplasm of ascending colon: Secondary | ICD-10-CM

## 2016-11-04 DIAGNOSIS — Z79899 Other long term (current) drug therapy: Secondary | ICD-10-CM | POA: Diagnosis not present

## 2016-11-04 DIAGNOSIS — I10 Essential (primary) hypertension: Secondary | ICD-10-CM

## 2016-11-04 DIAGNOSIS — N183 Chronic kidney disease, stage 3 (moderate): Secondary | ICD-10-CM

## 2016-11-04 LAB — COMPREHENSIVE METABOLIC PANEL
ALK PHOS: 69 U/L (ref 40–150)
ALT: 13 U/L (ref 0–55)
AST: 18 U/L (ref 5–34)
Albumin: 3.3 g/dL — ABNORMAL LOW (ref 3.5–5.0)
Anion Gap: 8 mEq/L (ref 3–11)
BILIRUBIN TOTAL: 0.54 mg/dL (ref 0.20–1.20)
BUN: 12.7 mg/dL (ref 7.0–26.0)
CALCIUM: 9.2 mg/dL (ref 8.4–10.4)
CO2: 22 mEq/L (ref 22–29)
Chloride: 110 mEq/L — ABNORMAL HIGH (ref 98–109)
Creatinine: 1.1 mg/dL (ref 0.6–1.1)
EGFR: 58 mL/min/{1.73_m2} — AB (ref >90)
Glucose: 83 mg/dl (ref 70–140)
POTASSIUM: 3.9 meq/L (ref 3.5–5.1)
SODIUM: 140 meq/L (ref 136–145)
TOTAL PROTEIN: 7.2 g/dL (ref 6.4–8.3)

## 2016-11-04 LAB — CBC WITH DIFFERENTIAL/PLATELET
BASO%: 0.6 % (ref 0.0–2.0)
Basophils Absolute: 0 10*3/uL (ref 0.0–0.1)
EOS%: 3.3 % (ref 0.0–7.0)
Eosinophils Absolute: 0.2 10*3/uL (ref 0.0–0.5)
HCT: 34.3 % — ABNORMAL LOW (ref 34.8–46.6)
HGB: 10.9 g/dL — ABNORMAL LOW (ref 11.6–15.9)
LYMPH%: 15.1 % (ref 14.0–49.7)
MCH: 28.3 pg (ref 25.1–34.0)
MCHC: 31.8 g/dL (ref 31.5–36.0)
MCV: 89.1 fL (ref 79.5–101.0)
MONO#: 0.5 10*3/uL (ref 0.1–0.9)
MONO%: 6.8 % (ref 0.0–14.0)
NEUT%: 74.2 % (ref 38.4–76.8)
NEUTROS ABS: 5 10*3/uL (ref 1.5–6.5)
PLATELETS: 345 10*3/uL (ref 145–400)
RBC: 3.85 10*6/uL (ref 3.70–5.45)
RDW: 18 % — ABNORMAL HIGH (ref 11.2–14.5)
WBC: 6.7 10*3/uL (ref 3.9–10.3)
lymph#: 1 10*3/uL (ref 0.9–3.3)

## 2016-11-04 LAB — UA PROTEIN, DIPSTICK - CHCC: PROTEIN: 100 mg/dL

## 2016-11-04 LAB — CEA (IN HOUSE-CHCC): CEA (CHCC-In House): 7.75 ng/mL — ABNORMAL HIGH (ref 0.00–5.00)

## 2016-11-04 MED ORDER — DEXAMETHASONE SODIUM PHOSPHATE 10 MG/ML IJ SOLN
INTRAMUSCULAR | Status: AC
Start: 2016-11-04 — End: 2016-11-04
  Filled 2016-11-04: qty 1

## 2016-11-04 MED ORDER — ATROPINE SULFATE 1 MG/ML IJ SOLN
INTRAMUSCULAR | Status: AC
  Filled 2016-11-04: qty 1

## 2016-11-04 MED ORDER — DEXAMETHASONE SODIUM PHOSPHATE 10 MG/ML IJ SOLN
10.0000 mg | Freq: Once | INTRAMUSCULAR | Status: AC
  Administered 2016-11-04: 10 mg via INTRAVENOUS

## 2016-11-04 MED ORDER — PALONOSETRON HCL INJECTION 0.25 MG/5ML
INTRAVENOUS | Status: AC
  Filled 2016-11-04: qty 5

## 2016-11-04 MED ORDER — SODIUM CHLORIDE 0.9 % IV SOLN
Freq: Once | INTRAVENOUS | Status: AC
  Administered 2016-11-04: 11:00:00 via INTRAVENOUS

## 2016-11-04 MED ORDER — IRINOTECAN HCL CHEMO INJECTION 100 MG/5ML
150.0000 mg/m2 | Freq: Once | INTRAVENOUS | Status: AC
  Administered 2016-11-04: 240 mg via INTRAVENOUS
  Filled 2016-11-04: qty 10

## 2016-11-04 MED ORDER — PALONOSETRON HCL INJECTION 0.25 MG/5ML
0.2500 mg | Freq: Once | INTRAVENOUS | Status: AC
  Administered 2016-11-04: 0.25 mg via INTRAVENOUS

## 2016-11-04 MED ORDER — ATROPINE SULFATE 1 MG/ML IJ SOLN
0.5000 mg | Freq: Once | INTRAMUSCULAR | Status: AC | PRN
  Administered 2016-11-04: 0.5 mg via INTRAVENOUS

## 2016-11-04 NOTE — Telephone Encounter (Signed)
Scheduled appt per 8/31 los - patient to get new schedule in the treatment area. -

## 2016-11-04 NOTE — Patient Instructions (Addendum)
[facility] Discharge Instructions for Patients Receiving Chemotherapy  Today you received the following chemotherapy agents IRINOTECAN To help prevent nausea and vomiting after your treatment, we encourage you to take your nausea medication  prochlorperazine (COMPAZINE) 5 MG tablet 30 tablet 0 06/03/2016    Sig - Route: Take 1 tablet (5 mg total) by mouth every 6 (six) hours as needed for nausea or vomiting. - Oral   Sent to pharmacy as: prochlorperazine (COMPAZINE) 5 MG tablet       If you develop nausea and vomiting that is not controlled by your nausea medication, call the clinic.   BELOW ARE SYMPTOMS THAT SHOULD BE REPORTED IMMEDIATELY:  *FEVER GREATER THAN 100.5 F  *CHILLS WITH OR WITHOUT FEVER  NAUSEA AND VOMITING THAT IS NOT CONTROLLED WITH YOUR NAUSEA MEDICATION  *UNUSUAL SHORTNESS OF BREATH  *UNUSUAL BRUISING OR BLEEDING  TENDERNESS IN MOUTH AND THROAT WITH OR WITHOUT PRESENCE OF ULCERS  *URINARY PROBLEMS  *BOWEL PROBLEMS  UNUSUAL RASH Items with * indicate a potential emergency and should be followed up as soon as possible.  Feel free to call the clinic you have any questions or concerns. The clinic phone number is [phone].  Please show the [facility] at check-in to the Emergency Department and triage nurse.

## 2016-11-06 ENCOUNTER — Encounter: Payer: Self-pay | Admitting: Hematology

## 2016-11-16 NOTE — Progress Notes (Signed)
Beth Wilkins  [phone] [phone]) [phone]  Clinic Follow Up Note   Patient Care Team: [name], MD as PCP - General (Internal Medicine) [name], MD as Consulting Physician (Medical Oncology) [name], MD as Consulting Physician (General Surgery) [name], MD as Consulting Physician (Orthopedic Surgery) [name] [name], MD as Consulting Physician (Urology) 11/18/2016     CHIEF COMPLAINTS:  Follow up metastatic colon cancer  Oncology History   Cancer of right colon [facility])   Staging form: Colon and Rectum, AJCC 7th Edition     Pathologic stage from 01/23/2015: Stage IIC (T4b, N0, cM0) - Signed by [name], MD on 02/05/2015       Cancer of right colon ([place])   01/21/2015 Imaging     CT abdomen and pelvis showed high-grade small bowel obstruction,  right I [place] ureteral nephrosis,  multiple liver cysts.  CT chest was negative for metastatic lesion.      01/23/2015 Pathology Results     invasive adenocarcinoma extending into the pericecal connective tissue,  grade 2, LVI (-),  no perineural invasion, 3 lymph nodes were negative.      01/23/2015 Surgery      terminal ileum and cecum seegmental resection with anastomosis by Dr. Hassell Done      01/23/2015 Initial Diagnosis    Cancer of right colon ([place])      03/13/2015 - 07/06/2015 Chemotherapy    Xeloda 1500 mg bid X 14 days on-7 off, s/p 5 cycles, stopped early due to wrosening renal function      10/28/2015 Progression    PET scan showed multiple hypermetabolic peritoneal nodules, right ovarian cystic mass, and hypermetabolic liver lesion, highly suspicious for metastatic colon cancer. Small pulmonary nodules are indeterminate.       11/11/2015 - 05/20/2016 Chemotherapy    Xeloda 1546m bid, 2 weeks on and one week off, Avastin was added on from cycle 3. Xeloda held from 05/20/16 due to progression on CT from 05/19/16 and total bilirubin increased to 2.6.      01/08/2016 Imaging    Ct chest, Abdomen Pelvis Wo Contrast 01/08/2016  IMPRESSION: 1. 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 2. Hepatic and renal cysts. 3.  Aortoiliac atherosclerotic vascular disease. 4. Lumbar spondylosis and degenerative disc disease with multilevel Impingement.      05/19/2016 Imaging    CT CAP w Contrast IMPRESSION: 1. Numerous small solid pulmonary nodules, one of which is new, and several of which have mildly increased in size since 10/28/2015, worrisome for mild progression of pulmonary metastases. 2. Solitary right liver lobe metastasis is mildly increased in size since 01/08/2016. 3. Peritoneal metastases have mildly increased in size since 01/08/2016, largest in the right pelvic sidewall. 4. Well-positioned right nephroureteral stent without overt right hydronephrosis. 5. Additional findings include aortic atherosclerosis, 1 vessel coronary atherosclerosis and mild to moderate emphysema.      06/03/2016 -  Chemotherapy    Irinotecan and Avastin every 2 weeks starting 06/03/16      07/29/2016 Tumor Marker    CEA 6.51       08/09/2016 Imaging    PET  IMPRESSION: 1. Overall mild improvement compared to FDG PET scan of 10/28/2015. 2. Pulmonary nodules are increased in size from PET-CT scan and similar size to most recent CT scan. Largest lesion RIGHT lower lobe does have associated faint metabolic activity consistent with pulmonary metastasis. 3. Decrease in metabolic activity of RIGHT hepatic lobe lesion. Potential  second lesion LEFT hepatic lobe with slightly lower metabolic activity. 4. Decreased metabolic activity at the enteric colonic anastomosis in the RIGHT lower quadrant. 5. Interval decrease in metabolic activity of peritoneal nodular metastasis. 6. Nodular implant implant along ventral abdominal wall may be within the musculature, but also improved.       HISTORY OF PRESENTING ILLNESS  (02/05/2015):  Beth Wilkins 76 y.o. female  with past medical history of moderate dementia, hypertension, is here because of recently diagnosed stage II colon cancer. She is accompanied to the clinic by her daughter. History is mainly obtained from the chart and her daughter.  She has had abdominal discomfort, nausea and vomiting, and norexia for  the past to 3 months along with 15 pounds weight loss, . Her nausea was mild and intermittent, she does not seek medical attention initially. She presented with  worsening symptoms for 2-3 weeks, and episodes of projectile vomiting and dehydration to emergency room, was admitted to [facility] on 01/20/2015. CT scan reviewed small bowel obstruction, and possible appendiceal rupture. She was brought to OR on 01/23/2015, underwent right hemicolectomy with anastomosis by Dr. Hassell Done. Surgical path reviewed adenocarcinoma at the cecum. She was discharged home on 01/26/2015.  She has recovered well, eating much better than prior to surgery, move around without restriction. No pain, she has loose BM 3 times a day, and she has backed off her mirealax.  she lives with her daughter. She states her appetite is moderate, sometime low, and she refuses to eat if she doesn't have appetite. No nausea, vomiting, abdominal bloating since her surgery.  Her last colonoscopy was 6-8 years ago, and her stool OB was negative this year.   CURRENT THERAPY: Irinotecan and Avastin every 2 weeks started 06/03/16  INTERIM HISTORY:  Beth Wilkins is here for a follow up. She presents to the clinic today accompanied by her daughter.  She reports to drinking enough and her stomach is doing well and has slight nausea that comes and goes. She does not need anti-nausea medication. Her daughter reports to her not having diarrhea. He daughter reports her mother is not sleeping well. She goes go bed at 7 and will wake up in the middle of the night.     MEDICAL HISTORY:  Past Medical  History:  Diagnosis Date  . Anxiety   . Arthritis   . Bilateral renal cysts   . Centrilobular emphysema ([place])   . Chronic low back pain   . CKD (chronic kidney disease), stage III   . Colon cancer [facility]) oncologist-  dr [name]   dx 01-23-2015  right colon Invasive adenocarcinoma into pericecal connective tissue and involving small intestine , Stage IIc, Grade 2, LVI(-),  (pT4b N0 M0) /  s/p resection termial ileum and cecum and chemotherapy 01-06-217 to 07-06-2015/   08/ 2017  per PET  recurrent w/ liver mets and peritoneal carcinomatosis  . Complication of anesthesia    can be combative due to dementia  . Full dentures   . History of gastroesophageal reflux (GERD)    changes diet  . History of pelvic fracture   . Hyperlipidemia   . Hypertension   . Liver metastasis ([place])   . 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 Pulmonary nodules   . Right ovarian cyst   . 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 UTI (urinary tract infection) 09/26/2016   5 day antibiotic treatment    SURGICAL HISTORY: Past Surgical History:  Procedure Laterality Date  . CATARACT EXTRACTION W/ INTRAOCULAR LENS  IMPLANT, BILATERAL  2005 approx  . CYSTOSCOPY W/ URETERAL STENT PLACEMENT Right 12/03/2015   Procedure: CYSTOSCOPY WITH RETROGRADE PYELOGRAM/URETERAL STENT PLACEMENT;  Surgeon: Cleon Gustin, MD;  Location: [facility];  Service: Urology;  Laterality: Right;  . CYSTOSCOPY W/ URETERAL STENT PLACEMENT Right 02/15/2016   Procedure: CYSTOSCOPY WITH RETROGRADE PYELOGRAM/URETERAL STENT REPLACEMENT;  Surgeon: Cleon Gustin, MD;  Location: [facility];  Service: Urology;  Laterality: Right;  . CYSTOSCOPY W/ URETERAL STENT PLACEMENT Right 03/28/2016   Procedure: CYSTOSCOPY WITH  RETROGRADE PYELOGRAM/URETERAL STENT EXCHANGE;  Surgeon: Cleon Gustin, MD;  Location[facility];  Service: Urology;  Laterality: Right;  . CYSTOSCOPY W/ URETERAL STENT PLACEMENT Right 07/18/2016   Procedure: CYSTOSCOPY WITH RETROGRADE PYELOGRAM/ STENT EXCHANGE;  Surgeon: Cleon Gustin, MD;  Location: [facility];  Service: Urology;  Laterality: Right;  . CYSTOSCOPY W/ URETERAL STENT PLACEMENT Right 10/10/2016   Procedure: CYSTOSCOPY WITH RETROGRADE PYELOGRAM/URETERAL STENT REPLACEMENT;  Surgeon: Cleon Gustin, MD;  Location: [facility];  Service: Urology;  Laterality: Right;  . CYSTOSCOPY WITH RETROGRADE PYELOGRAM, URETEROSCOPY AND STENT PLACEMENT Right 09/21/2015   Procedure: CYSTOSCOPY WITH BIL RETROGRADE PYELOGRAM, URETEROSCOPY, RIGHT STENT PLACEMENT ;  Surgeon: Cleon Gustin, MD;  Location: [facility];  Service: Urology;  Laterality: Right;  . LAPAROSCOPY N/A 01/23/2015   Procedure: DIAGNOSTIC LAPAROSCOPY, EXPLORATORY LAPAROTOMY  RIGHT HEMI-COLECTOMY FOR OBSTRUCTION OF RIGHT TERMINAL ILEUM;  Surgeon: [name], MD;  Location: [facility];  Service: General;  Laterality: N/A;    SOCIAL HISTORY: Social History   Social History  . Marital status: Single    Spouse name: N/A  . Number of children: N/A  . Years of education: N/A   Occupational History  . Not on file.   Social History Main Topics  . Smoking status: Former Smoker    Packs/day: 0.25    Years: 50.00    Types: Cigarettes    Quit date: 01/25/2015  . Smokeless tobacco: Never Used  . Alcohol use 1.8 oz/week    2 Glasses of wine, 1 Cans of beer per week     Comment: occasional   . Drug use: No  . Sexual activity: Not on file   Other Topics Concern  . Not on file   Social History Narrative   Legally separated   Lives w/daughter, Briyanna Billingham   Has total of #2 daughters and #1 son   Dementia   Fall Risk-ambulates w/cane    FAMILY  HISTORY: Family History  Problem Relation Age of Onset  . Diabetes Mellitus II Father   . Cancer Mother 17       ovarian cancer   . Cervical cancer Other     ALLERGIES:  is allergic to namenda [memantine hcl] and oxycodone.  MEDICATIONS:  Current Outpatient Prescriptions  Medication Sig Dispense Refill  . amLODipine (NORVASC) 5 MG tablet Take 5 mg by mouth every morning.     . Cholecalciferol (VITAMIN D3) 2000 UNITS TABS Take by mouth daily.    . Coenzyme Q10 (CO Q 10) 100 MG CAPS Take by mouth daily.    Marland Kitchen donepezil (ARICEPT) 10 MG tablet TAKE 1 TABLET BY MOUTH EVERY DAY 30 tablet 11  . Meth-Hyo-M  Bl-Na Phos-Ph Sal (URIBEL) 118 MG CAPS Take 1 capsule (118 mg total) by mouth 2 (two) times daily as needed. 30 capsule 3  . Meth-Hyo-M Bl-Na Phos-Ph Sal (URIBEL) 118 MG CAPS Take 1 capsule (118 mg total) by mouth 2 (two) times daily as needed. 30 capsule 3  . mirabegron ER (MYRBETRIQ) 50 MG TB24 tablet Take 50 mg by mouth daily.    . mirtazapine (REMERON) 15 MG tablet TAKE 1 TABLET BY MOUTH AT BEDTIME 30 tablet 3  . Multiple Vitamins-Minerals (OCUVITE-LUTEIN PO) Take 1 tablet by mouth daily.    . Multiple Vitamins-Minerals (WOMENS MULTIVITAMIN PO) Take 1 tablet by mouth daily.    . polyethylene glycol (MIRALAX / GLYCOLAX) packet Take 17 g by mouth daily. (Patient taking differently: Take 17 g by mouth daily as needed. ) 14 each 0  . prochlorperazine (COMPAZINE) 5 MG tablet Take 1 tablet (5 mg total) by mouth every 6 (six) hours as needed for nausea or vomiting. 30 tablet 0  . traMADol (ULTRAM) 50 MG tablet Take 1 tablet (50 mg total) by mouth every 6 (six) hours as needed. 30 tablet 0  . UNABLE TO FIND Med Name: Closys Mouth Rinse two-three times daily for ulcer.    . vitamin B-12 (CYANOCOBALAMIN) 1000 MCG tablet Take 1,000 mcg by mouth daily.     No current facility-administered medications for this visit.     REVIEW OF SYSTEMS:  Constitutional: Denies fevers, chills or abnormal night  sweats (+) Change in sleep cycell  Eyes: Denies blurriness of vision, double vision or watery eyes  Ears, nose, mouth, throat, and face: Denies mucositis or sore throat Respiratory: Denies cough, dyspnea or wheezes Cardiovascular: Denies palpitation, chest discomfort or lower extremity swelling Gastrointestinal:  Denies heartburn  Skin: Denies abnormal skin rashes Lymphatics: Denies new lymphadenopathy or easy bruising Neurological:Denies numbness, tingling or new weaknesses Behavioral/Psych: Mood is stable, no new changes (+) Dementia All other systems were reviewed with the patient and are negative.  PHYSICAL EXAMINATION:  ECOG PERFORMANCE STATUS: 1 - Symptomatic but completely ambulatory  Vitals:   11/18/16 0916  BP: (!) 146/89  Pulse: 79  Resp: 17  Temp: 98.7 F (37.1 C)   Filed Weights   11/18/16 0916  Weight: 120 lb 9.6 oz (54.7 kg)     GENERAL:alert, no distress and comfortable SKIN: skin color, texture, turgor are normal, no rashes or significant lesions EYES: normal, conjunctiva are pink and non-injected, sclera clear (+) difficult to evaluate due to her dementia OROPHARYNX:no exudate and tongue normal (+) Upper and lower dentures  NECK: supple, thyroid normal size, non-tender, without nodularity LYMPH:  no palpable lymphadenopathy in the cervical, axillary or inguinal LUNGS: clear to auscultation and percussion with normal breathing effort HEART: regular rate & rhythm and no murmurs and no lower extremity edema ABDOMEN:abdomen soft, non-tender and normal bowel sounds  Musculoskeletal:no cyanosis of digits and no clubbing  PSYCH: alert and with fluent speech, (+) dementia  NEURO: no focal motor/sensory deficits  LABORATORY DATA:  I have reviewed the data as listed CBC Latest Ref Rng & Units 11/18/2016 11/04/2016 10/21/2016  WBC 3.9 - 10.3 10e3/uL 7.7 6.7 7.8  Hemoglobin 11.6 - 15.9 g/dL 12.1 10.9(L) 11.4(L)  Hematocrit 34.8 - 46.6 % 37.1 34.3(L) 36.1  Platelets  145 - 400 10e3/uL 322 345 400    CMP Latest Ref Rng & Units 11/18/2016 11/04/2016 10/21/2016  Glucose 70 - 140 mg/dl 75 83 75  BUN 7.0 - 26.0 mg/dL 18.5 12.7 15.9  Creatinine 0.6 - 1.1 mg/dL 1.2(H) 1.1 1.1  Sodium 136 - 145 mEq/L 143 140 143  Potassium 3.5 - 5.1 mEq/L 3.8 3.9 4.4  Chloride 101 - 111 mmol/L - - -  CO2 22 - 29 mEq/L _0 Calcium 8.4 - 10.4 mg/dL 9.8 9.2 9.6  Total Protein 6.4 - 8.3 g/dL 7.9 7.2 7.6  Total Bilirubin 0.20 - 1.20 mg/dL 0.53 0.54 0.25  Alkaline Phos 40 - 150 U/L 75 69 75  AST 5 - 34 U/L _1 ALT 0 - 55 U/L _2 CEA  01/23/2015: 5.7 06/05/2015: 6.1 08/04/2015: 10.3 10/12/2015: 13.29 (in-house)  11/04/2015: 12.83 01/13/2016: 8.57 02/24/2016: 8.41 04/27/2016: 9.22 05/20/2016: 8.32 06/17/2016: 6.45 07/15/2016: 5.07 07/29/2016: 6.51 09/09/16: 7.47 10/07/16: 7.85 11/04/16: 7.75  PATHOLOGY REPORT: Diagnosis 01/23/2015 Colon, segmental resection, with terminal ileum and cecum - INVASIVE ADENOCARCINOMA EXTENDING INTO PERICECAL CONNECTIVE TISSUE AND INVOLVING SMALL INTESTINE. - THREE BENIGN LYMPH NODES (0/3). - SEE ONCOLOGY TABLE BELOW.  Microscopic Comment COLON AND RECTUM (INCLUDING TRANS-ANAL RESECTION): Specimen: Terminal ileum and cecum. Procedure: Resection. Tumor site: Cecum adjacent to appendix. Specimen integrity: Intact. Macroscopic intactness of mesorectum: Not applicable. Macroscopic tumor perforation: No. Invasive tumor: Maximum size: 4.0 cm. Histologic type(s): Colorectal adenocarcinoma. Histologic grade and differentiation: G2: moderately differentiated/low grade. Type of polyp in which invasive carcinoma arose: No residual polyp. Microscopic extension of invasive tumor: Into pericecal connective tissue and terminal ileum. Lymph-Vascular invasion: Not identified. Peri-neural invasion: Not identified. Tumor deposit(s) (discontinuous extramural extension): No. Resection margins: Proximal margin: Free of tumor. Distal  margin: Free of tumor. Circumferential (radial) (posterior ascending, posterior descending; lateral and posterior mid-rectum; and entire lower 1/3 rectum): Free of tumor. Mesenteric margin (sigmoid and transverse): N/A. Distance closest margin (if all above margins negative): N/A. Treatment effect (neo-adjuvant therapy): No. Additional polyp(s): No. Non-neoplastic findings: N/A. Lymph nodes: number examined 3; number positive: 0. Pathologic Staging: pT4b, pN0, pMX. Ancillary studies: Mismatch repair protein by immunohistochemistry pending. Comments: There is a colorectal-type adenocarcinoma arising in the cecum in the area of the appendiceal orifice. The tumor extends into the pericecal connective tissue, and involves the attached segment of terminal ileum. Immunohistochemistry shows the tumor is positive with CDX2 and cytokeratin 20, and negative with cytokeratin 7, estrogen receptor and WT-1 supporting a diagnosis of primary colorectal adenocarcinoma. ADDITIONAL INFORMATION: Mismatch Repair (MMR) Protein Immunohistochemistry (IHC) IHC Expression Result: MLH1: Preserved nuclear expression (greater 50% tumor expression) MSH2: Preserved nuclear expression (greater 50% tumor expression) MSH6: Preserved nuclear expression (greater 50% tumor expression) PMS2: Preserved nuclear expression (greater 50% tumor expression) * Internal control demonstrates intact nuclear expression Interpretation: NORMAL There is preserved expression of the major and minor MMR proteins. There is a very low probability that microsatellite instability (MSI) is present. However, certain clinically significant MMR protein mutations may result in preservation of nuclear expression. It is recommended that the preservation of protein expression be correlated with molecular based MSI testing.    RADIOGRAPHIC STUDIES: I have personally reviewed the radiological images as listed and agreed with the findings in the  report.  PET 08/09/2016 IMPRESSION: 1. Overall mild improvement compared to FDG PET scan of 10/28/2015. 2. Pulmonary nodules are increased in size from PET-CT scan and similar size to most recent CT scan. Largest lesion RIGHT lower lobe does have associated faint metabolic activity consistent with pulmonary metastasis. 3. Decrease in metabolic activity of RIGHT hepatic lobe lesion. Potential second lesion LEFT hepatic lobe with slightly lower metabolic activity. 4.  Decreased metabolic activity at the enteric colonic anastomosis in the RIGHT lower quadrant. 5. Interval decrease in metabolic activity of peritoneal nodular metastasis. 6. Nodular implant implant along ventral abdominal wall may be within the musculature, but also improved.  CT CAP w Contrast 05/19/2016 IMPRESSION: 1. Numerous small solid pulmonary nodules, one of which is new, and several of which have mildly increased in size since 10/28/2015, worrisome for mild progression of pulmonary metastases. 2. Solitary right liver lobe metastasis is mildly increased in size since 01/08/2016. 3. Peritoneal metastases have mildly increased in size since 01/08/2016, largest in the right pelvic sidewall. 4. Well-positioned right nephroureteral stent without overt right hydronephrosis. 5. Additional findings include aortic atherosclerosis, 1 vessel coronary atherosclerosis and mild to moderate emphysema.  Ct chest, Abdomen Pelvis Wo Contrast 01/08/2016  IMPRESSION: 1. 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 2. Hepatic and renal cysts. 3.  Aortoiliac atherosclerotic vascular disease. 4. Lumbar spondylosis and degenerative disc disease with multilevel impingement.  ASSESSMENT & PLAN: 76 y.o. African-American female, with past medical history of moderate dementia, independent with ADLs, hypertension, presents with small bowel obstruction and weight loss.  1. Right  colon adenocarcinoma from cecum, pT4bN0M0, stage IIc, MMR normal, KRAS mutation (+), recurrent metastatic disease in 10/2015 - she initially had high risk stage II colon cancer, I have recommended adjuvant chemotherapy Xeloda  -she completed a total of 5 cycles (out of 8 cycles) Xeloda, stopped early due to her worsening renal function. -I personally reviewed her recent PET scan images, unfortunately the scan showed hypermetabolic peritoneal and liver metastases. 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 -She started Xeloda 1553m bid, 2 weeks on and one week off. 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 The tumor has MSI stable, she is not a candidate for immunotherapy, although she may be a candidate [facility] -I previously reviewed her restaging CT scan from 01/08/2016, which showed overall stable disease, no new lesions. We'll continue her chemotherapy. -I previously reviewed the CT from 05/19/2016 with the patient and her daughter in detail. She has had a moderate disease progression in lungs, liver and peritoneum. I recommended her to change treatment. -she is now on second line single agent rinotecan at a reduced dose 1547mm2, tolerated well, will continue. -The goal of therapy is palliative, mainly to improve her quality of life. -If she develops poor tolerance to chemotherapy, or further disease progression on next scan, I will likely stop chemotherapy and change to palliative care alone, due to her advanced age and comorbidities, especially dementia. - PET Scan image from 08/09/16 showed good partial response to treatment. We will continue  irinotecan and Avastin. -We previously  discussed pt being able to take chemo break and she will notify usKoreaf needed  -Labs reviewed and her CBC and CMP overall normal. Her CEA is still slightly elevated but stable.  -Her last urine stick test showed protein 100, so we will get urine sample today to repeat her urine protein to see if we need to hold avastin. I will have her check her urine every 2 weeks.  -I will hold Avastin if her urine protein 300  or above  -PET scan is scheduled for 11/25/16   2. CKD stage III, right hydroureter -Her Cr was around 1 in 08/2013, Cr increased to 2.4 on 01/20/2015 when she was diagnosed with colon cancer due to dehydration, improved some and moved around1.3-1.5 .  -A previous CT scan showed right hydroureter, which probably contributes to her renal insufficiency. -she had ureter stent placement again in 12/2015, 03/2016, and 04/2016 -Patient had a stent replacement on 07/19/2015 -Previously underwent renal stent exchange.  -Cr is overall stable  3. HTN, dementia -She will continue her medication and follow-up with her primary care physician. -Her BP has been better controlled lately, we discussed that Avastin can cause hypertension, we'll continue monitoring closely. -Her dementia has been getting worse gradually, which has increased her care burden on her daughter. I previously discussed burn-out prevention with her daughter, she appreciated and has planned to have a care-giver for her mother  -Her BP was elevated at 146/89 today (11/18/16), we will repeat in infusion room today. I suggest they check her BP at home often.  5. Nausea and diarrhea -Secondary to chemotherapy, overall very mild and manageable  -She is to take Imodium PRN and her nausea medications. -Nausea has improved and diarrhea has resolved.   8. Goal of care discussion  -We previously discussed the incurable nature of her cancer, and the overall poor prognosis, especially if she has  progress on chemo -The patient understands the goal of care is palliative. -Due to her advanced age and comorbidities, especially dementia, I have low threshold to switch her to palliative care alone if she does not tolerate chemo well or dementia gets worse, her daughter is on -board  -I recommend DNR/DNI, pt and her daughter agrees.   9. Insomnia  -I first suggest melatonin over-the counter and to check with her neurologist.  -If that does not help she can get use prescription medication.   Plan -Labs reviewed, stable for treatment today, will proceed irinotecan and Avastin -Labs, flush, and chemo irinotecan and avatin in 2 weeks  -F/u in 2 wks with scan prior.   All questions were answered. The patient knows to call the clinic with any problems, questions or concerns.  I spent 20 minutes counseling the patient face to face. The total time spent in the appointment was 25 minutes and more than 50% was on counseling.  This document serves as a record of services personally performed by [name], MD. It was created on her behalf by Joslyn Devon, a trained medical scribe. The creation of this record is based on the scribe's personal observations and the provider's statements to them. This document has been checked and approved by the attending provider.   [name]  11/18/2016 5:47 PM

## 2016-11-18 ENCOUNTER — Encounter: Payer: Self-pay | Admitting: Hematology

## 2016-11-18 ENCOUNTER — Ambulatory Visit (HOSPITAL_BASED_OUTPATIENT_CLINIC_OR_DEPARTMENT_OTHER): Payer: Medicare Other

## 2016-11-18 ENCOUNTER — Other Ambulatory Visit (HOSPITAL_BASED_OUTPATIENT_CLINIC_OR_DEPARTMENT_OTHER): Payer: Medicare Other

## 2016-11-18 ENCOUNTER — Ambulatory Visit (HOSPITAL_BASED_OUTPATIENT_CLINIC_OR_DEPARTMENT_OTHER): Payer: Medicare Other | Admitting: Hematology

## 2016-11-18 VITALS — BP 143/89 | HR 89

## 2016-11-18 VITALS — BP 146/89 | HR 79 | Temp 98.7°F | Resp 17 | Ht 66.0 in | Wt 120.6 lb

## 2016-11-18 DIAGNOSIS — C182 Malignant neoplasm of ascending colon: Secondary | ICD-10-CM

## 2016-11-18 DIAGNOSIS — Z5111 Encounter for antineoplastic chemotherapy: Secondary | ICD-10-CM | POA: Diagnosis not present

## 2016-11-18 DIAGNOSIS — R634 Abnormal weight loss: Secondary | ICD-10-CM

## 2016-11-18 DIAGNOSIS — N183 Chronic kidney disease, stage 3 (moderate): Secondary | ICD-10-CM

## 2016-11-18 DIAGNOSIS — I10 Essential (primary) hypertension: Secondary | ICD-10-CM

## 2016-11-18 DIAGNOSIS — F0390 Unspecified dementia without behavioral disturbance: Secondary | ICD-10-CM | POA: Diagnosis not present

## 2016-11-18 DIAGNOSIS — R110 Nausea: Secondary | ICD-10-CM

## 2016-11-18 DIAGNOSIS — C180 Malignant neoplasm of cecum: Secondary | ICD-10-CM | POA: Diagnosis not present

## 2016-11-18 DIAGNOSIS — D638 Anemia in other chronic diseases classified elsewhere: Secondary | ICD-10-CM

## 2016-11-18 DIAGNOSIS — Z5112 Encounter for antineoplastic immunotherapy: Secondary | ICD-10-CM

## 2016-11-18 DIAGNOSIS — C786 Secondary malignant neoplasm of retroperitoneum and peritoneum: Secondary | ICD-10-CM

## 2016-11-18 LAB — COMPREHENSIVE METABOLIC PANEL
ALBUMIN: 3.5 g/dL (ref 3.5–5.0)
ALT: 11 U/L (ref 0–55)
ANION GAP: 13 meq/L — AB (ref 3–11)
AST: 18 U/L (ref 5–34)
Alkaline Phosphatase: 75 U/L (ref 40–150)
BUN: 18.5 mg/dL (ref 7.0–26.0)
CO2: 22 mEq/L (ref 22–29)
Calcium: 9.8 mg/dL (ref 8.4–10.4)
Chloride: 108 mEq/L (ref 98–109)
Creatinine: 1.2 mg/dL — ABNORMAL HIGH (ref 0.6–1.1)
EGFR: 54 mL/min/{1.73_m2} — ABNORMAL LOW (ref >90)
Glucose: 75 mg/dl (ref 70–140)
Potassium: 3.8 mEq/L (ref 3.5–5.1)
Sodium: 143 mEq/L (ref 136–145)
TOTAL PROTEIN: 7.9 g/dL (ref 6.4–8.3)
Total Bilirubin: 0.53 mg/dL (ref 0.20–1.20)

## 2016-11-18 LAB — CBC WITH DIFFERENTIAL/PLATELET
BASO%: 1.1 % (ref 0.0–2.0)
Basophils Absolute: 0.1 10*3/uL (ref 0.0–0.1)
EOS%: 4.6 % (ref 0.0–7.0)
Eosinophils Absolute: 0.4 10*3/uL (ref 0.0–0.5)
HEMATOCRIT: 37.1 % (ref 34.8–46.6)
HGB: 12.1 g/dL (ref 11.6–15.9)
LYMPH#: 1.3 10*3/uL (ref 0.9–3.3)
LYMPH%: 16.6 % (ref 14.0–49.7)
MCH: 28.7 pg (ref 25.1–34.0)
MCHC: 32.7 g/dL (ref 31.5–36.0)
MCV: 87.7 fL (ref 79.5–101.0)
MONO#: 0.5 10*3/uL (ref 0.1–0.9)
MONO%: 6.8 % (ref 0.0–14.0)
NEUT%: 70.9 % (ref 38.4–76.8)
NEUTROS ABS: 5.5 10*3/uL (ref 1.5–6.5)
PLATELETS: 322 10*3/uL (ref 145–400)
RBC: 4.23 10*6/uL (ref 3.70–5.45)
RDW: 19.3 % — ABNORMAL HIGH (ref 11.2–14.5)
WBC: 7.7 10*3/uL (ref 3.9–10.3)

## 2016-11-18 LAB — UA PROTEIN, DIPSTICK - CHCC: Protein, ur: 100 mg/dL

## 2016-11-18 MED ORDER — SODIUM CHLORIDE 0.9 % IV SOLN
Freq: Once | INTRAVENOUS | Status: AC
  Administered 2016-11-18: 10:00:00 via INTRAVENOUS

## 2016-11-18 MED ORDER — ATROPINE SULFATE 1 MG/ML IJ SOLN
0.5000 mg | Freq: Once | INTRAMUSCULAR | Status: AC | PRN
  Administered 2016-11-18: 0.5 mg via INTRAVENOUS

## 2016-11-18 MED ORDER — DEXAMETHASONE SODIUM PHOSPHATE 10 MG/ML IJ SOLN
10.0000 mg | Freq: Once | INTRAMUSCULAR | Status: AC
  Administered 2016-11-18: 10 mg via INTRAVENOUS

## 2016-11-18 MED ORDER — SODIUM CHLORIDE 0.9 % IV SOLN
300.0000 mg | Freq: Once | INTRAVENOUS | Status: AC
  Administered 2016-11-18: 300 mg via INTRAVENOUS
  Filled 2016-11-18: qty 12

## 2016-11-18 MED ORDER — ATROPINE SULFATE 1 MG/ML IJ SOLN
INTRAMUSCULAR | Status: AC
  Filled 2016-11-18: qty 1

## 2016-11-18 MED ORDER — DEXAMETHASONE SODIUM PHOSPHATE 10 MG/ML IJ SOLN
INTRAMUSCULAR | Status: AC
Start: 2016-11-18 — End: 2016-11-18
  Filled 2016-11-18: qty 1

## 2016-11-18 MED ORDER — PALONOSETRON HCL INJECTION 0.25 MG/5ML
0.2500 mg | Freq: Once | INTRAVENOUS | Status: AC
  Administered 2016-11-18: 0.25 mg via INTRAVENOUS

## 2016-11-18 MED ORDER — IRINOTECAN HCL CHEMO INJECTION 100 MG/5ML
150.0000 mg/m2 | Freq: Once | INTRAVENOUS | Status: AC
  Administered 2016-11-18: 240 mg via INTRAVENOUS
  Filled 2016-11-18: qty 2

## 2016-11-18 MED ORDER — PALONOSETRON HCL INJECTION 0.25 MG/5ML
INTRAVENOUS | Status: AC
  Filled 2016-11-18: qty 5

## 2016-11-18 NOTE — Patient Instructions (Signed)
[facility] Discharge Instructions for Patients Receiving Chemotherapy  Today you received the following chemotherapy agents irinotecan/avastin   To help prevent nausea and vomiting after your treatment, we encourage you to take your nausea medication as directed.  If you develop nausea and vomiting that is not controlled by your nausea medication, call the clinic.   BELOW ARE SYMPTOMS THAT SHOULD BE REPORTED IMMEDIATELY:  *FEVER GREATER THAN 100.5 F  *CHILLS WITH OR WITHOUT FEVER  NAUSEA AND VOMITING THAT IS NOT CONTROLLED WITH YOUR NAUSEA MEDICATION  *UNUSUAL SHORTNESS OF BREATH  *UNUSUAL BRUISING OR BLEEDING  TENDERNESS IN MOUTH AND THROAT WITH OR WITHOUT PRESENCE OF ULCERS  *URINARY PROBLEMS  *BOWEL PROBLEMS  UNUSUAL RASH Items with * indicate a potential emergency and should be followed up as soon as possible.  Feel free to call the clinic you have any questions or concerns. The clinic phone number is [phone].

## 2016-11-22 MED FILL — MIRTAZAPINE 15 MG TAB: 15 | 30 days supply | Qty: 30 | Fill #2

## 2016-11-25 ENCOUNTER — Ambulatory Visit (HOSPITAL_COMMUNITY)
Admission: RE | Admit: 2016-11-25 | Discharge: 2016-11-25 | Disposition: A | Discharge status: Home / Self Care | Payer: Medicare Other | Source: Ambulatory Visit | Attending: Hematology | Admitting: Hematology

## 2016-11-25 DIAGNOSIS — C182 Malignant neoplasm of ascending colon: Secondary | ICD-10-CM

## 2016-11-25 DIAGNOSIS — R918 Other nonspecific abnormal finding of lung field: Secondary | ICD-10-CM | POA: Insufficient documentation

## 2016-11-25 DIAGNOSIS — C786 Secondary malignant neoplasm of retroperitoneum and peritoneum: Secondary | ICD-10-CM | POA: Insufficient documentation

## 2016-11-25 LAB — GLUCOSE, CAPILLARY: Glucose-Capillary: 111 mg/dL — ABNORMAL HIGH (ref 65–99)

## 2016-11-25 MED ORDER — FLUDEOXYGLUCOSE F - 18 (FDG) INJECTION
6.3000 | Freq: Once | INTRAVENOUS | Status: AC | PRN
  Administered 2016-11-25: 6.3 via INTRAVENOUS

## 2016-11-30 NOTE — Progress Notes (Signed)
[place]  [phone]) [phone] [phone]  Clinic Follow Up Note   Patient Care Team: [name], MD as PCP - General (Internal Medicine) [name], MD as Consulting Physician (Medical Oncology) [name], MD as Consulting Physician (General Surgery) [name], MD as Consulting Physician (Orthopedic Surgery) [name] [name], MD as Consulting Physician (Urology) 12/02/2016     CHIEF COMPLAINTS:  Follow up metastatic colon cancer  Oncology History   Cancer of right colon [facility])   Staging form: Colon and Rectum, AJCC 7th Edition     Pathologic stage from 01/23/2015: Stage IIC (T4b, N0, cM0) - Signed by [name], MD on 02/05/2015       Cancer of right colon ([place])   01/21/2015 Imaging     CT abdomen and pelvis showed high-grade small bowel obstruction,  right I [place] ureteral nephrosis,  multiple liver cysts.  CT chest was negative for metastatic lesion.      01/23/2015 Pathology Results     invasive adenocarcinoma extending into the pericecal connective tissue,  grade 2, LVI (-),  no perineural invasion, 3 lymph nodes were negative.      01/23/2015 Surgery      terminal ileum and cecum seegmental resection with anastomosis by Dr. Hassell Done      01/23/2015 Initial Diagnosis    Cancer of right colon ([place])      03/13/2015 - 07/06/2015 Chemotherapy    Xeloda 1500 mg bid X 14 days on-7 off, s/p 5 cycles, stopped early due to wrosening renal function      10/28/2015 Progression    PET scan showed multiple hypermetabolic peritoneal nodules, right ovarian cystic mass, and hypermetabolic liver lesion, highly suspicious for metastatic colon cancer. Small pulmonary nodules are indeterminate.       11/11/2015 - 05/20/2016 Chemotherapy    Xeloda 1572m bid, 2 weeks on and one week off, Avastin was added on from cycle 3. Xeloda held from 05/20/16 due to progression on CT from 05/19/16 and total bilirubin increased to 2.6.      01/08/2016 Imaging    Ct chest, Abdomen Pelvis Wo Contrast 01/08/2016  IMPRESSION: 1. 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 2. Hepatic and renal cysts. 3.  Aortoiliac atherosclerotic vascular disease. 4. Lumbar spondylosis and degenerative disc disease with multilevel Impingement.      05/19/2016 Imaging    CT CAP w Contrast IMPRESSION: 1. Numerous small solid pulmonary nodules, one of which is new, and several of which have mildly increased in size since 10/28/2015, worrisome for mild progression of pulmonary metastases. 2. Solitary right liver lobe metastasis is mildly increased in size since 01/08/2016. 3. Peritoneal metastases have mildly increased in size since 01/08/2016, largest in the right pelvic sidewall. 4. Well-positioned right nephroureteral stent without overt right hydronephrosis. 5. Additional findings include aortic atherosclerosis, 1 vessel coronary atherosclerosis and mild to moderate emphysema.      06/03/2016 -  Chemotherapy    Irinotecan and Avastin every 2 weeks starting 06/03/16      07/29/2016 Tumor Marker    CEA 6.51       08/09/2016 Imaging    PET  IMPRESSION: 1. Overall mild improvement compared to FDG PET scan of 10/28/2015. 2. Pulmonary nodules are increased in size from PET-CT scan and similar size to most recent CT scan. Largest lesion RIGHT lower lobe does have associated faint metabolic activity consistent with pulmonary metastasis. 3. Decrease in metabolic activity of RIGHT hepatic lobe lesion. Potential  second lesion LEFT hepatic lobe with slightly lower metabolic activity. 4. Decreased metabolic activity at the enteric colonic anastomosis in the RIGHT lower quadrant. 5. Interval decrease in metabolic activity of peritoneal nodular metastasis. 6. Nodular implant implant along ventral abdominal wall may be within the musculature, but also improved.      11/25/2016 PET scan    PET  11/25/16 IMPRESSION: 1. Interval worsening in the interval. Numerous pulmonary nodules are again seen. While multiple nodules are stable, there are several nodules which are a little larger in the interval. They remain too small to completely characterize with PET-CT. The 2 hepatic metastases demonstrate increased FDG uptake on today's study compared to the previous study. Abdominal wall and peritoneal metastases are mildly worsened.       HISTORY OF PRESENTING ILLNESS (02/05/2015):  Beth Wilkins 76 y.o. female  with past medical history of moderate dementia, hypertension, is here because of recently diagnosed stage II colon cancer. She is accompanied to the clinic by her daughter. History is mainly obtained from the chart and her daughter.  She has had abdominal discomfort, nausea and vomiting, and norexia for  the past to 3 months along with 15 pounds weight loss, . Her nausea was mild and intermittent, she does not seek medical attention initially. She presented with  worsening symptoms for 2-3 weeks, and episodes of projectile vomiting and dehydration to emergency room, was admitted to [facility] on 01/20/2015. CT scan reviewed small bowel obstruction, and possible appendiceal rupture. She was brought to OR on 01/23/2015, underwent right hemicolectomy with anastomosis by Dr. Hassell Done. Surgical path reviewed adenocarcinoma at the cecum. She was discharged home on 01/26/2015.  She has recovered well, eating much better than prior to surgery, move around without restriction. No pain, she has loose BM 3 times a day, and she has backed off her mirealax.  she lives with her daughter. She states her appetite is moderate, sometime low, and she refuses to eat if she doesn't have appetite. No nausea, vomiting, abdominal bloating since her surgery.  Her last colonoscopy was 6-8 years ago, and her stool OB was negative this year.   CURRENT THERAPY: Irinotecan and Avastin every 2 weeks started  06/03/16  INTERIM HISTORY:  Beth Wilkins is here for a follow up. She presents to the clinic today accompanied by her daughter.   Her daughter reports the patient fell on September 10th from a triple carpeted floor and fell on her buttocks. She did not want to go to the doctor afterward. She has been getting no more than 4 tylenol no everyday as well as tramadol for her stent. She has been alternating that with a hot pack and Bengay. Daughter is able to position her mother in bed so she is able to sleep at night with her stent for less pain.   Her duaghter notes being able to do more with more for herself like walking better and getting around more. Her daughter alternates her meals so she will eat more.   Patient notes she has stomach and throat pain today. Tea usually helps clear her throat pain along with mouth wash use. Her Bm are good with occasional "pebbly" stool but normally soft.   Daughter reports her mother wants to fight her cancer. So she will continue to monitor how her mother responds to treatment. She will no when she needs to stop treatment.     MEDICAL HISTORY:  Past Medical History:  Diagnosis Date  . Anxiety   .  Arthritis   . Bilateral renal cysts   . Centrilobular emphysema ([place])   . Chronic low back pain   . CKD (chronic kidney disease), stage III   . Colon cancer [facility]) oncologist-  dr [name]   dx 01-23-2015  right colon Invasive adenocarcinoma into pericecal connective tissue and involving small intestine , Stage IIc, Grade 2, LVI(-),  (pT4b N0 M0) /  s/p resection termial ileum and cecum and chemotherapy 01-06-217 to 07-06-2015/   08/ 2017  per PET  recurrent w/ liver mets and peritoneal carcinomatosis  . Complication of anesthesia    can be combative due to dementia  . Full dentures   . History of gastroesophageal reflux (GERD)    changes diet  . History of pelvic fracture   . Hyperlipidemia   . Hypertension   . Liver metastasis ([place])   . Moderate  dementia without behavioral disturbance    moderate to severe dementia per neurologist note (dr Krista Blue) in epic  . Pulmonary nodules   . Right ovarian cyst   . 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 UTI (urinary tract infection) 09/26/2016   5 day antibiotic treatment    SURGICAL HISTORY: Past Surgical History:  Procedure Laterality Date  . CATARACT EXTRACTION W/ INTRAOCULAR LENS  IMPLANT, BILATERAL  2005 approx  . CYSTOSCOPY W/ URETERAL STENT PLACEMENT Right 12/03/2015   Procedure: CYSTOSCOPY WITH RETROGRADE PYELOGRAM/URETERAL STENT PLACEMENT;  Surgeon: Cleon Gustin, MD;  Location: [facility];  Service: Urology;  Laterality: Right;  . CYSTOSCOPY W/ URETERAL STENT PLACEMENT Right 02/15/2016   Procedure: CYSTOSCOPY WITH RETROGRADE PYELOGRAM/URETERAL STENT REPLACEMENT;  Surgeon: Cleon Gustin, MD;  Location: [facility];  Service: Urology;  Laterality: Right;  . CYSTOSCOPY W/ URETERAL STENT PLACEMENT Right 03/28/2016   Procedure: CYSTOSCOPY WITH RETROGRADE PYELOGRAM/URETERAL STENT EXCHANGE;  Surgeon: Cleon Gustin, MD;  Location: [facility];  Service: Urology;  Laterality: Right;  . CYSTOSCOPY W/ URETERAL STENT PLACEMENT Right 07/18/2016   Procedure: CYSTOSCOPY WITH RETROGRADE PYELOGRAM/ STENT EXCHANGE;  Surgeon: Cleon Gustin, MD;  Location: [facility];  Service: Urology;  Laterality: Right;  . CYSTOSCOPY W/ URETERAL STENT PLACEMENT Right 10/10/2016   Procedure: CYSTOSCOPY WITH RETROGRADE PYELOGRAM/URETERAL STENT REPLACEMENT;  Surgeon: Cleon Gustin, MD;  Location: [facility];  Service: Urology;  Laterality: Right;  . CYSTOSCOPY WITH RETROGRADE PYELOGRAM, URETEROSCOPY AND STENT PLACEMENT Right 09/21/2015   Procedure: CYSTOSCOPY WITH BIL RETROGRADE PYELOGRAM, URETEROSCOPY, RIGHT STENT  PLACEMENT ;  Surgeon: Cleon Gustin, MD;  Location: [facility];  Service: Urology;  Laterality: Right;  . LAPAROSCOPY N/A 01/23/2015   Procedure: DIAGNOSTIC LAPAROSCOPY, EXPLORATORY LAPAROTOMY  RIGHT HEMI-COLECTOMY FOR OBSTRUCTION OF RIGHT TERMINAL ILEUM;  Surgeon: [name], MD;  Location: [facility];  Service: General;  Laterality: N/A;    SOCIAL HISTORY: Social History   Social History  . Marital status: Single    Spouse name: N/A  . Number of children: N/A  . Years of education: N/A   Occupational History  . Not on file.   Social History Main Topics  . Smoking status: Former Smoker    Packs/day: 0.25    Years: 50.00    Types: Cigarettes    Quit date: 01/25/2015  . Smokeless tobacco: Never Used  . Alcohol use 1.8 oz/week    2 Glasses of wine, 1 Cans of  beer per week     Comment: occasional   . Drug use: No  . Sexual activity: Not on file   Other Topics Concern  . Not on file   Social History Narrative   Legally separated   Lives w/daughter, Kalista Laguardia   Has total of #2 daughters and #1 son   Dementia   Fall Risk-ambulates w/cane    FAMILY HISTORY: Family History  Problem Relation Age of Onset  . Diabetes Mellitus II Father   . Cancer Mother 74       ovarian cancer   . Cervical cancer Other     ALLERGIES:  is allergic to namenda [memantine hcl] and oxycodone.  MEDICATIONS:  Current Outpatient Prescriptions  Medication Sig Dispense Refill  . amLODipine (NORVASC) 5 MG tablet Take 5 mg by mouth every morning.     . Cholecalciferol (VITAMIN D3) 2000 UNITS TABS Take by mouth daily.    . Coenzyme Q10 (CO Q 10) 100 MG CAPS Take by mouth daily.    . Lutein-Zeaxanthin 25-5 MG CAPS Take by mouth.    . Meth-Hyo-M Bl-Na Phos-Ph Sal (URIBEL) 118 MG CAPS Take 1 capsule (118 mg total) by mouth 2 (two) times daily as needed. 30 capsule 3  . Meth-Hyo-M Bl-Na Phos-Ph Sal (URIBEL) 118 MG CAPS Take 1 capsule (118 mg total) by mouth 2 (two) times  daily as needed. 30 capsule 3  . Meth-Hyo-M Bl-Na Phos-Ph Sal (URO-MP) 118 MG CAPS     . mirabegron ER (MYRBETRIQ) 50 MG TB24 tablet Take 50 mg by mouth daily.    . mirtazapine (REMERON) 15 MG tablet TAKE 1 TABLET BY MOUTH AT BEDTIME 30 tablet 3  . mirtazapine (REMERON) 15 MG tablet     . Multiple Vitamins-Minerals (OCUVITE-LUTEIN PO) Take 1 tablet by mouth daily.    . Multiple Vitamins-Minerals (WOMENS MULTIVITAMIN PO) Take 1 tablet by mouth daily.    Marland Kitchen MYRBETRIQ 25 MG TB24 tablet   11  . polyethylene glycol (MIRALAX / GLYCOLAX) packet Take 17 g by mouth daily. (Patient taking differently: Take 17 g by mouth daily as needed. ) 14 each 0  . prochlorperazine (COMPAZINE) 5 MG tablet Take 1 tablet (5 mg total) by mouth every 6 (six) hours as needed for nausea or vomiting. 30 tablet 0  . traMADol (ULTRAM) 50 MG tablet Take 1 tablet (50 mg total) by mouth every 6 (six) hours as needed. 30 tablet 0  . UNABLE TO FIND Med Name: Closys Mouth Rinse two-three times daily for ulcer.    . vitamin B-12 (CYANOCOBALAMIN) 1000 MCG tablet Take 1,000 mcg by mouth daily.     No current facility-administered medications for this visit.    Facility-Administered Medications Ordered in Other Visits  Medication Dose Route Frequency Provider Last Rate Last Dose  . atropine injection 0.5 mg  0.5 mg Intravenous Once PRN [name], MD      . irinotecan (CAMPTOSAR) 240 mg in dextrose 5 % 500 mL chemo infusion  150 mg/m2 (Treatment Plan Recorded) Intravenous Once [name], MD        REVIEW OF SYSTEMS:  Constitutional: Denies fevers, chills or abnormal night sweats (+) Change in sleep cycell  Eyes: Denies blurriness of vision, double vision or watery eyes  Ears, nose, mouth, throat, and face: Denies mucositis or sore throat (+) throat pin  Respiratory: Denies cough, dyspnea or wheezes Cardiovascular: Denies palpitation, chest discomfort or lower extremity swelling Gastrointestinal:  Denies heartburn (+) abdominal pain   Skin:  Denies abnormal skin rashes Lymphatics: Denies new lymphadenopathy or easy bruising Neurological:Denies numbness, tingling or new weaknesses Behavioral/Psych: Mood is stable, no new changes (+) Dementia All other systems were reviewed with the patient and are negative.  PHYSICAL EXAMINATION:  ECOG PERFORMANCE STATUS: 1 - Symptomatic but completely ambulatory  Vitals:   12/02/16 0837  BP: (!) 173/93  Pulse: 86  Resp: 18  Temp: 97.7 F (36.5 C)  SpO2: 100%   Filed Weights   12/02/16 0837  Weight: 149 lb (67.6 kg)      GENERAL:alert, no distress and comfortable SKIN: skin color, texture, turgor are normal, no rashes or significant lesions EYES: normal, conjunctiva are pink and non-injected, sclera clear (+) difficult to evaluate due to her dementia OROPHARYNX:no exudate and tongue normal (+) Upper and lower dentures  NECK: supple, thyroid normal size, non-tender, without nodularity LYMPH:  no palpable lymphadenopathy in the cervical, axillary or inguinal LUNGS: clear to auscultation and percussion with normal breathing effort HEART: regular rate & rhythm and no murmurs and no lower extremity edema ABDOMEN:abdomen soft, non-tender and normal bowel sounds  Musculoskeletal:no cyanosis of digits and no clubbing  PSYCH: alert and with fluent speech, (+) dementia  NEURO: no focal motor/sensory deficits  LABORATORY DATA:  I have reviewed the data as listed CBC Latest Ref Rng & Units 12/02/2016 11/18/2016 11/04/2016  WBC 3.9 - 10.3 10e3/uL 5.9 7.7 6.7  Hemoglobin 11.6 - 15.9 g/dL 11.5(L) 12.1 10.9(L)  Hematocrit 34.8 - 46.6 % 35.8 37.1 34.3(L)  Platelets 145 - 400 10e3/uL 358 322 345    CMP Latest Ref Rng & Units 12/02/2016 11/18/2016 11/04/2016  Glucose 70 - 140 mg/dl 66(L) 75 83  BUN 7.0 - 26.0 mg/dL 23.4 18.5 12.7  Creatinine 0.6 - 1.1 mg/dL 1.2(H) 1.2(H) 1.1  Sodium 136 - 145 mEq/L 144 143 140  Potassium 3.5 - 5.1 mEq/L 3.5 3.8 3.9  Chloride 101 - 111 mmol/L - - -  CO2  22 - 29 mEq/L _0 Calcium 8.4 - 10.4 mg/dL 9.6 9.8 9.2  Total Protein 6.4 - 8.3 g/dL 7.6 7.9 7.2  Total Bilirubin 0.20 - 1.20 mg/dL 0.49 0.53 0.54  Alkaline Phos 40 - 150 U/L 88 75 69  AST 5 - 34 U/L _1 ALT 0 - 55 U/L _2 CEA  01/23/2015: 5.7 06/05/2015: 6.1 08/04/2015: 10.3 10/12/2015: 13.29 (in-house)  11/04/2015: 12.83 01/13/2016: 8.57 02/24/2016: 8.41 04/27/2016: 9.22 05/20/2016: 8.32 06/17/2016: 6.45 07/15/2016: 5.07 07/29/2016: 6.51 09/09/16: 7.47 10/07/16: 7.85 11/04/16: 7.75 12/02/16: PENDING   PATHOLOGY REPORT: Diagnosis 01/23/2015 Colon, segmental resection, with terminal ileum and cecum - INVASIVE ADENOCARCINOMA EXTENDING INTO PERICECAL CONNECTIVE TISSUE AND INVOLVING SMALL INTESTINE. - THREE BENIGN LYMPH NODES (0/3). - SEE ONCOLOGY TABLE BELOW.  Microscopic Comment COLON AND RECTUM (INCLUDING TRANS-ANAL RESECTION): Specimen: Terminal ileum and cecum. Procedure: Resection. Tumor site: Cecum adjacent to appendix. Specimen integrity: Intact. Macroscopic intactness of mesorectum: Not applicable. Macroscopic tumor perforation: No. Invasive tumor: Maximum size: 4.0 cm. Histologic type(s): Colorectal adenocarcinoma. Histologic grade and differentiation: G2: moderately differentiated/low grade. Type of polyp in which invasive carcinoma arose: No residual polyp. Microscopic extension of invasive tumor: Into pericecal connective tissue and terminal ileum. Lymph-Vascular invasion: Not identified. Peri-neural invasion: Not identified. Tumor deposit(s) (discontinuous extramural extension): No. Resection margins: Proximal margin: Free of tumor. Distal margin: Free of tumor. Circumferential (radial) (posterior ascending, posterior descending; lateral and posterior mid-rectum; and entire lower 1/3 rectum): Free of tumor. Mesenteric margin (sigmoid and  transverse): N/A. Distance closest margin (if all above margins negative): N/A. Treatment effect  (neo-adjuvant therapy): No. Additional polyp(s): No. Non-neoplastic findings: N/A. Lymph nodes: number examined 3; number positive: 0. Pathologic Staging: pT4b, pN0, pMX. Ancillary studies: Mismatch repair protein by immunohistochemistry pending. Comments: There is a colorectal-type adenocarcinoma arising in the cecum in the area of the appendiceal orifice. The tumor extends into the pericecal connective tissue, and involves the attached segment of terminal ileum. Immunohistochemistry shows the tumor is positive with CDX2 and cytokeratin 20, and negative with cytokeratin 7, estrogen receptor and WT-1 supporting a diagnosis of primary colorectal adenocarcinoma. ADDITIONAL INFORMATION: Mismatch Repair (MMR) Protein Immunohistochemistry (IHC) IHC Expression Result: MLH1: Preserved nuclear expression (greater 50% tumor expression) MSH2: Preserved nuclear expression (greater 50% tumor expression) MSH6: Preserved nuclear expression (greater 50% tumor expression) PMS2: Preserved nuclear expression (greater 50% tumor expression) * Internal control demonstrates intact nuclear expression Interpretation: NORMAL There is preserved expression of the major and minor MMR proteins. There is a very low probability that microsatellite instability (MSI) is present. However, certain clinically significant MMR protein mutations may result in preservation of nuclear expression. It is recommended that the preservation of protein expression be correlated with molecular based MSI testing.    RADIOGRAPHIC STUDIES: I have personally reviewed the radiological images as listed and agreed with the findings in the report.  PET 11/25/16 IMPRESSION: 1. Interval worsening in the interval. Numerous pulmonary nodules are again seen. While multiple nodules are stable, there are several nodules which are a little larger in the interval. They remain too small to completely characterize with PET-CT. The 2  hepatic metastases demonstrate increased FDG uptake on today's study compared to the previous study. Abdominal wall and peritoneal metastases are mildly worsened.  PET 08/09/2016 IMPRESSION: 1. Overall mild improvement compared to FDG PET scan of 10/28/2015. 2. Pulmonary nodules are increased in size from PET-CT scan and similar size to most recent CT scan. Largest lesion RIGHT lower lobe does have associated faint metabolic activity consistent with pulmonary metastasis. 3. Decrease in metabolic activity of RIGHT hepatic lobe lesion. Potential second lesion LEFT hepatic lobe with slightly lower metabolic activity. 4. Decreased metabolic activity at the enteric colonic anastomosis in the RIGHT lower quadrant. 5. Interval decrease in metabolic activity of peritoneal nodular metastasis. 6. Nodular implant implant along ventral abdominal wall may be within the musculature, but also improved.  CT CAP w Contrast 05/19/2016 IMPRESSION: 1. Numerous small solid pulmonary nodules, one of which is new, and several of which have mildly increased in size since 10/28/2015, worrisome for mild progression of pulmonary metastases. 2. Solitary right liver lobe metastasis is mildly increased in size since 01/08/2016. 3. Peritoneal metastases have mildly increased in size since 01/08/2016, largest in the right pelvic sidewall. 4. Well-positioned right nephroureteral stent without overt right hydronephrosis. 5. Additional findings include aortic atherosclerosis, 1 vessel coronary atherosclerosis and mild to moderate emphysema.  Ct chest, Abdomen Pelvis Wo Contrast 01/08/2016  IMPRESSION: 1. 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 2. Hepatic and renal cysts. 3.  Aortoiliac atherosclerotic vascular disease. 4. Lumbar spondylosis and degenerative disc disease with multilevel impingement.  ASSESSMENT & PLAN: 76 y.o. African-American  female, with past medical history of moderate dementia, independent with ADLs, hypertension, presents with small bowel obstruction and weight loss.  1. 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 -I personally reviewed her recent PET scan images, unfortunately the scan showed hypermetabolic peritoneal and liver metastases. 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 -She started Xeloda 1524m bid, 2 weeks on and one week off. 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 The tumor has MSI stable, she is not a candidate for immunotherapy, although she may be a candidate [facility] -I previously reviewed her restaging CT scan from 01/08/2016, which showed overall stable disease, no new lesions. We'll continue her chemotherapy. -I previously reviewed the CT from 05/19/2016 with the patient and her daughter in detail. She has had a moderate disease progression in lungs, liver and peritoneum. I recommended her to change treatment. -she is now on second line single agent rinotecan at a reduced dose 1574mm2, tolerated well, will continue. -The goal of therapy is palliative, mainly to improve her quality of life. -If she develops poor tolerance to chemotherapy, or further disease progression on next scan, I will likely stop chemotherapy and change to palliative care alone, due  to her advanced age and comorbidities, especially dementia. - PET Scan image from 08/09/16 showed good partial response to treatment. We will continue irinotecan and Avastin. -We previously  discussed pt being able to take chemo break and she will notify usKoreaf needed  -I will hold Avastin if her urine protein 300 or above  -We discussed her 11/25/16 PET scan. She has small increase in size of some of her small lung nodules, and mild increased FDG of her liver mets, but overall stable disease. I think the benefit of current treatment is getting smaller. We may need to change treatment in 2 months after a repeat scan. Will continue Irinotecan and avastin for now. She agrees.  -We will consider changing her treatment to 5-fu weekly in the future if she has disease progression   -I have low threshold to stop her chemotherapy due to her comorbidities, especially dementia, if she develops more symptoms or intolerance to chemotherapy treatment. -Labs reviewed and her CBC and CMP overall normal. Her CEA is still slightly elevated but stable.  -I encouraged her to eat whatever she would like to eat to help her appetite. Cholesterol is not an immediate concern at this time.   -F/u in 5 weeks with me.    2. CKD stage III, right hydroureter  -Her Cr was around 1 in 08/2013, Cr increased to 2.4 on 01/20/2015 when she was diagnosed with colon cancer due to dehydration, improved some and moved around1.3-1.5 .  -A previous CT scan showed right hydroureter, which probably contributes to her renal insufficiency. -she had ureter stent placement again in 12/2015, 03/2016, and 04/2016 -Patient had a stent replacement on 07/19/2015 -Previously underwent renal stent exchange.  -Cr is overall stable  3. HTN, dementia  -She will continue her medication and follow-up with her primary care physician. -Her BP has been better controlled lately, we discussed that Avastin can cause hypertension, we'll continue monitoring  closely. -Her dementia has been getting worse gradually, which has increased her  care burden on her daughter. I previously discussed burn-out prevention with her daughter, she appreciated and has planned to have a care-giver for her mother  -Her BP was elevated at 146/89 (11/18/16), we repeated in infusion room. I suggest they check her BP at home often. -Her BP remains elevated so will repeat in clinic today  5. Nausea and diarrhea -Secondary to chemotherapy, overall very mild and manageable  -She is to take Imodium PRN and her nausea medications. -Nausea has improved and diarrhea has resolved.   8. Goal of care discussion  -We previously discussed the incurable nature of her cancer, and the overall poor prognosis, especially if she has progress on chemo -The patient understands the goal of care is palliative. -Due to her advanced age and comorbidities, especially dementia, I have low threshold to switch her to palliative care alone if she does not tolerate chemo well or dementia gets worse, her daughter is on -board  -I recommend DNR/DNI, pt and her daughter agrees.   9. Insomnia  -I first suggest melatonin over-the counter and to check with her neurologist.  -If that does not help she can use prescription medication.   Plan -Labs reviewed, stable for treatment today, will proceed irinotecan and Avastin -Move her next appointments from 10/12 to 10/19 (her daughter's request), and add f/u with lacie, and schedule lab, flush, f/u and chemo irinotecan in 5 weeks  -Will receive flu shot today   All questions were answered. The patient knows to call the clinic with any problems, questions or concerns.  I spent 20 minutes counseling the patient face to face. The total time spent in the appointment was 25 minutes and more than 50% was on counseling.  This document serves as a record of services personally performed by [name], MD. It was created on her behalf by Joslyn Devon, a trained  medical scribe. The creation of this record is based on the scribe's personal observations and the provider's statements to them. This document has been checked and approved by the attending provider.   [name]  12/02/2016 10:34 AM

## 2016-12-02 ENCOUNTER — Telehealth: Payer: Self-pay | Admitting: Hematology

## 2016-12-02 ENCOUNTER — Other Ambulatory Visit (HOSPITAL_BASED_OUTPATIENT_CLINIC_OR_DEPARTMENT_OTHER): Payer: Medicare Other

## 2016-12-02 ENCOUNTER — Ambulatory Visit (HOSPITAL_BASED_OUTPATIENT_CLINIC_OR_DEPARTMENT_OTHER): Payer: Medicare Other | Admitting: Hematology

## 2016-12-02 ENCOUNTER — Ambulatory Visit (HOSPITAL_BASED_OUTPATIENT_CLINIC_OR_DEPARTMENT_OTHER): Payer: Medicare Other

## 2016-12-02 ENCOUNTER — Encounter: Payer: Self-pay | Admitting: Hematology

## 2016-12-02 VITALS — BP 173/93 | HR 86 | Temp 97.7°F | Resp 18 | Ht 66.0 in | Wt 149.0 lb

## 2016-12-02 DIAGNOSIS — I10 Essential (primary) hypertension: Secondary | ICD-10-CM | POA: Diagnosis not present

## 2016-12-02 DIAGNOSIS — C182 Malignant neoplasm of ascending colon: Secondary | ICD-10-CM

## 2016-12-02 DIAGNOSIS — C180 Malignant neoplasm of cecum: Secondary | ICD-10-CM

## 2016-12-02 DIAGNOSIS — C786 Secondary malignant neoplasm of retroperitoneum and peritoneum: Secondary | ICD-10-CM | POA: Diagnosis not present

## 2016-12-02 DIAGNOSIS — D638 Anemia in other chronic diseases classified elsewhere: Secondary | ICD-10-CM | POA: Diagnosis not present

## 2016-12-02 DIAGNOSIS — F0390 Unspecified dementia without behavioral disturbance: Secondary | ICD-10-CM

## 2016-12-02 DIAGNOSIS — Z79899 Other long term (current) drug therapy: Secondary | ICD-10-CM

## 2016-12-02 DIAGNOSIS — Z5111 Encounter for antineoplastic chemotherapy: Secondary | ICD-10-CM | POA: Diagnosis not present

## 2016-12-02 DIAGNOSIS — Z5112 Encounter for antineoplastic immunotherapy: Secondary | ICD-10-CM | POA: Diagnosis not present

## 2016-12-02 LAB — COMPREHENSIVE METABOLIC PANEL
ALBUMIN: 3.5 g/dL (ref 3.5–5.0)
ALK PHOS: 88 U/L (ref 40–150)
ALT: 9 U/L (ref 0–55)
AST: 18 U/L (ref 5–34)
Anion Gap: 11 mEq/L (ref 3–11)
BILIRUBIN TOTAL: 0.49 mg/dL (ref 0.20–1.20)
BUN: 23.4 mg/dL (ref 7.0–26.0)
CO2: 23 mEq/L (ref 22–29)
Calcium: 9.6 mg/dL (ref 8.4–10.4)
Chloride: 110 mEq/L — ABNORMAL HIGH (ref 98–109)
Creatinine: 1.2 mg/dL — ABNORMAL HIGH (ref 0.6–1.1)
EGFR: 51 mL/min/{1.73_m2} — AB (ref >90)
GLUCOSE: 66 mg/dL — AB (ref 70–140)
Potassium: 3.5 mEq/L (ref 3.5–5.1)
SODIUM: 144 meq/L (ref 136–145)
TOTAL PROTEIN: 7.6 g/dL (ref 6.4–8.3)

## 2016-12-02 LAB — CBC WITH DIFFERENTIAL/PLATELET
BASO%: 1.1 % (ref 0.0–2.0)
Basophils Absolute: 0.1 10*3/uL (ref 0.0–0.1)
EOS ABS: 0.2 10*3/uL (ref 0.0–0.5)
EOS%: 4.1 % (ref 0.0–7.0)
HCT: 35.8 % (ref 34.8–46.6)
HEMOGLOBIN: 11.5 g/dL — AB (ref 11.6–15.9)
LYMPH%: 14.3 % (ref 14.0–49.7)
MCH: 28.5 pg (ref 25.1–34.0)
MCHC: 32.1 g/dL (ref 31.5–36.0)
MCV: 88.6 fL (ref 79.5–101.0)
MONO#: 0.4 10*3/uL (ref 0.1–0.9)
MONO%: 6.1 % (ref 0.0–14.0)
NEUT%: 74.4 % (ref 38.4–76.8)
NEUTROS ABS: 4.4 10*3/uL (ref 1.5–6.5)
Platelets: 358 10*3/uL (ref 145–400)
RBC: 4.04 10*6/uL (ref 3.70–5.45)
RDW: 19.8 % — AB (ref 11.2–14.5)
WBC: 5.9 10*3/uL (ref 3.9–10.3)
lymph#: 0.8 10*3/uL — ABNORMAL LOW (ref 0.9–3.3)

## 2016-12-02 LAB — UA PROTEIN, DIPSTICK - CHCC: Protein, ur: 100 mg/dL

## 2016-12-02 LAB — CEA (IN HOUSE-CHCC): CEA (CHCC-In House): 8.13 ng/mL — ABNORMAL HIGH (ref 0.00–5.00)

## 2016-12-02 MED ORDER — PALONOSETRON HCL INJECTION 0.25 MG/5ML
INTRAVENOUS | Status: AC
  Filled 2016-12-02: qty 5

## 2016-12-02 MED ORDER — SODIUM CHLORIDE 0.9 % IV SOLN
Freq: Once | INTRAVENOUS | Status: AC
  Administered 2016-12-02: 10:00:00 via INTRAVENOUS

## 2016-12-02 MED ORDER — DEXAMETHASONE SODIUM PHOSPHATE 10 MG/ML IJ SOLN
10.0000 mg | Freq: Once | INTRAMUSCULAR | Status: AC
  Administered 2016-12-02: 10 mg via INTRAVENOUS

## 2016-12-02 MED ORDER — SODIUM CHLORIDE 0.9 % IV SOLN
5.8000 mg/kg | Freq: Once | INTRAVENOUS | Status: AC
  Administered 2016-12-02: 300 mg via INTRAVENOUS
  Filled 2016-12-02: qty 12

## 2016-12-02 MED ORDER — IRINOTECAN HCL CHEMO INJECTION 100 MG/5ML
150.0000 mg/m2 | Freq: Once | INTRAVENOUS | Status: AC
  Administered 2016-12-02: 240 mg via INTRAVENOUS
  Filled 2016-12-02: qty 2

## 2016-12-02 MED ORDER — DEXAMETHASONE SODIUM PHOSPHATE 10 MG/ML IJ SOLN
INTRAMUSCULAR | Status: AC
  Filled 2016-12-02: qty 1

## 2016-12-02 MED ORDER — ATROPINE SULFATE 1 MG/ML IJ SOLN
0.5000 mg | Freq: Once | INTRAMUSCULAR | Status: AC | PRN
  Administered 2016-12-02: 0.5 mg via INTRAVENOUS

## 2016-12-02 MED ORDER — ATROPINE SULFATE 1 MG/ML IJ SOLN
INTRAMUSCULAR | Status: AC
Start: 2016-12-02 — End: 2016-12-02
  Filled 2016-12-02: qty 1

## 2016-12-02 MED ORDER — SODIUM CHLORIDE 0.9 % IV SOLN: Freq: Once | INTRAVENOUS | Status: DC

## 2016-12-02 MED ORDER — PALONOSETRON HCL INJECTION 0.25 MG/5ML
0.2500 mg | Freq: Once | INTRAVENOUS | Status: AC
  Administered 2016-12-02: 0.25 mg via INTRAVENOUS

## 2016-12-02 NOTE — Progress Notes (Signed)
Per Dr Burr Medico OK to proceed with Avastin with BP 173/93.  Pt dtr states she gave pt her BP med "about 15 minutes before we got back here".

## 2016-12-02 NOTE — Patient Instructions (Signed)
[facility] Discharge Instructions for Patients Receiving Chemotherapy  Today you received the following chemotherapy agents:  Irinotecan  To help prevent nausea and vomiting after your treatment, we encourage you to take your nausea medication as prescribed.   If you develop nausea and vomiting that is not controlled by your nausea medication, call the clinic.   BELOW ARE SYMPTOMS THAT SHOULD BE REPORTED IMMEDIATELY:  *FEVER GREATER THAN 100.5 F  *CHILLS WITH OR WITHOUT FEVER  NAUSEA AND VOMITING THAT IS NOT CONTROLLED WITH YOUR NAUSEA MEDICATION  *UNUSUAL SHORTNESS OF BREATH  *UNUSUAL BRUISING OR BLEEDING  TENDERNESS IN MOUTH AND THROAT WITH OR WITHOUT PRESENCE OF ULCERS  *URINARY PROBLEMS  *BOWEL PROBLEMS  UNUSUAL RASH Items with * indicate a potential emergency and should be followed up as soon as possible.  Feel free to call the clinic should you have any questions or concerns. The clinic phone number is [phone].  Please show the CHEMO ALERT CARD at check-in to the Emergency Department and triage nurse.   

## 2016-12-02 NOTE — Telephone Encounter (Signed)
Gave patients daughter AVS and calendar of upcoming October appointments.  °

## 2016-12-05 MED FILL — AMLODIPINE BESYLATE 5 MG TA: 5 | 30 days supply | Qty: 30 | Fill #1

## 2016-12-14 MED FILL — MYRBETRIQ ER 25 MG TABLET: 25 | 30 days supply | Qty: 30 | Fill #2

## 2016-12-16 ENCOUNTER — Ambulatory Visit: Payer: Medicare Other

## 2016-12-16 ENCOUNTER — Other Ambulatory Visit: Payer: Medicare Other

## 2016-12-20 MED FILL — URO-MP CAPSULE: 118 | 15 days supply | Qty: 30 | Fill #3

## 2016-12-20 MED FILL — MIRTAZAPINE 15 MG TAB: 15 | 30 days supply | Qty: 30 | Fill #3

## 2016-12-23 ENCOUNTER — Telehealth: Payer: Self-pay | Admitting: Nurse Practitioner

## 2016-12-23 ENCOUNTER — Other Ambulatory Visit (HOSPITAL_BASED_OUTPATIENT_CLINIC_OR_DEPARTMENT_OTHER): Payer: Medicare Other

## 2016-12-23 ENCOUNTER — Ambulatory Visit (HOSPITAL_BASED_OUTPATIENT_CLINIC_OR_DEPARTMENT_OTHER): Payer: Medicare Other | Admitting: Nurse Practitioner

## 2016-12-23 ENCOUNTER — Encounter: Payer: Self-pay | Admitting: Nurse Practitioner

## 2016-12-23 ENCOUNTER — Ambulatory Visit (HOSPITAL_BASED_OUTPATIENT_CLINIC_OR_DEPARTMENT_OTHER): Payer: Medicare Other

## 2016-12-23 VITALS — BP 160/76 | HR 80 | Temp 97.6°F | Resp 18 | Ht 66.0 in | Wt 117.9 lb

## 2016-12-23 DIAGNOSIS — I10 Essential (primary) hypertension: Secondary | ICD-10-CM | POA: Diagnosis not present

## 2016-12-23 DIAGNOSIS — N183 Chronic kidney disease, stage 3 (moderate): Secondary | ICD-10-CM | POA: Diagnosis not present

## 2016-12-23 DIAGNOSIS — Z5112 Encounter for antineoplastic immunotherapy: Secondary | ICD-10-CM | POA: Diagnosis not present

## 2016-12-23 DIAGNOSIS — Z23 Encounter for immunization: Secondary | ICD-10-CM

## 2016-12-23 DIAGNOSIS — C786 Secondary malignant neoplasm of retroperitoneum and peritoneum: Secondary | ICD-10-CM | POA: Diagnosis not present

## 2016-12-23 DIAGNOSIS — Z5111 Encounter for antineoplastic chemotherapy: Secondary | ICD-10-CM

## 2016-12-23 DIAGNOSIS — C182 Malignant neoplasm of ascending colon: Secondary | ICD-10-CM

## 2016-12-23 DIAGNOSIS — C180 Malignant neoplasm of cecum: Secondary | ICD-10-CM

## 2016-12-23 LAB — CBC WITH DIFFERENTIAL/PLATELET
BASO%: 1.2 % (ref 0.0–2.0)
BASOS ABS: 0.1 10*3/uL (ref 0.0–0.1)
EOS%: 4.6 % (ref 0.0–7.0)
Eosinophils Absolute: 0.3 10*3/uL (ref 0.0–0.5)
HCT: 38.7 % (ref 34.8–46.6)
HEMOGLOBIN: 12.4 g/dL (ref 11.6–15.9)
LYMPH%: 16.5 % (ref 14.0–49.7)
MCH: 28.5 pg (ref 25.1–34.0)
MCHC: 32.1 g/dL (ref 31.5–36.0)
MCV: 88.9 fL (ref 79.5–101.0)
MONO#: 0.4 10*3/uL (ref 0.1–0.9)
MONO%: 6.1 % (ref 0.0–14.0)
NEUT%: 71.6 % (ref 38.4–76.8)
NEUTROS ABS: 4.5 10*3/uL (ref 1.5–6.5)
Platelets: 276 10*3/uL (ref 145–400)
RBC: 4.36 10*6/uL (ref 3.70–5.45)
RDW: 21.3 % — AB (ref 11.2–14.5)
WBC: 6.3 10*3/uL (ref 3.9–10.3)
lymph#: 1 10*3/uL (ref 0.9–3.3)

## 2016-12-23 LAB — COMPREHENSIVE METABOLIC PANEL
ALBUMIN: 3.5 g/dL (ref 3.5–5.0)
ALK PHOS: 76 U/L (ref 40–150)
ALT: 14 U/L (ref 0–55)
AST: 19 U/L (ref 5–34)
Anion Gap: 11 mEq/L (ref 3–11)
BUN: 18.4 mg/dL (ref 7.0–26.0)
CO2: 23 meq/L (ref 22–29)
Calcium: 9.2 mg/dL (ref 8.4–10.4)
Chloride: 109 mEq/L (ref 98–109)
Creatinine: 1.2 mg/dL — ABNORMAL HIGH (ref 0.6–1.1)
EGFR: 51 mL/min/{1.73_m2} — AB (ref >60)
GLUCOSE: 55 mg/dL — AB (ref 70–140)
POTASSIUM: 3.7 meq/L (ref 3.5–5.1)
SODIUM: 143 meq/L (ref 136–145)
Total Bilirubin: 0.5 mg/dL (ref 0.20–1.20)
Total Protein: 7.4 g/dL (ref 6.4–8.3)

## 2016-12-23 LAB — UA PROTEIN, DIPSTICK - CHCC: Protein, ur: 100 mg/dL

## 2016-12-23 MED ORDER — SODIUM CHLORIDE 0.9 % IV SOLN
5.5000 mg/kg | Freq: Once | INTRAVENOUS | Status: AC
  Administered 2016-12-23: 300 mg via INTRAVENOUS
  Filled 2016-12-23: qty 12

## 2016-12-23 MED ORDER — DEXAMETHASONE SODIUM PHOSPHATE 10 MG/ML IJ SOLN
INTRAMUSCULAR | Status: AC
  Filled 2016-12-23: qty 1

## 2016-12-23 MED ORDER — PALONOSETRON HCL INJECTION 0.25 MG/5ML
INTRAVENOUS | Status: AC
  Filled 2016-12-23: qty 5

## 2016-12-23 MED ORDER — ATROPINE SULFATE 1 MG/ML IJ SOLN
INTRAMUSCULAR | Status: AC
  Filled 2016-12-23: qty 1

## 2016-12-23 MED ORDER — ATROPINE SULFATE 1 MG/ML IJ SOLN
0.5000 mg | Freq: Once | INTRAMUSCULAR | Status: AC | PRN
  Administered 2016-12-23: 0.5 mg via INTRAVENOUS

## 2016-12-23 MED ORDER — INFLUENZA VAC SPLIT QUAD 0.5 ML IM SUSY
0.5000 mL | PREFILLED_SYRINGE | Freq: Once | INTRAMUSCULAR | Status: AC
Start: 2016-12-23 — End: 2016-12-23
  Administered 2016-12-23: 0.5 mL via INTRAMUSCULAR
  Filled 2016-12-23: qty 0.5

## 2016-12-23 MED ORDER — PALONOSETRON HCL INJECTION 0.25 MG/5ML
0.2500 mg | Freq: Once | INTRAVENOUS | Status: AC
  Administered 2016-12-23: 0.25 mg via INTRAVENOUS

## 2016-12-23 MED ORDER — IRINOTECAN HCL CHEMO INJECTION 100 MG/5ML
150.0000 mg/m2 | Freq: Once | INTRAVENOUS | Status: AC
  Administered 2016-12-23: 240 mg via INTRAVENOUS
  Filled 2016-12-23: qty 12

## 2016-12-23 MED ORDER — SODIUM CHLORIDE 0.9 % IV SOLN
Freq: Once | INTRAVENOUS | Status: AC
  Administered 2016-12-23: 11:00:00 via INTRAVENOUS

## 2016-12-23 MED ORDER — DEXAMETHASONE SODIUM PHOSPHATE 10 MG/ML IJ SOLN
10.0000 mg | Freq: Once | INTRAMUSCULAR | Status: AC
  Administered 2016-12-23: 10 mg via INTRAVENOUS

## 2016-12-23 NOTE — Progress Notes (Signed)
Per Dr Irene Limbo, [place] to get Avastin with urine protein of 100.

## 2016-12-23 NOTE — Patient Instructions (Signed)
[facility] Discharge Instructions for Patients Receiving Chemotherapy  Today you received the following chemotherapy agents:  Avastin, Irinoteca  To help prevent nausea and vomiting after your treatment, we encourage you to take your nausea medication as prescribed.   If you develop nausea and vomiting that is not controlled by your nausea medication, call the clinic.   BELOW ARE SYMPTOMS THAT SHOULD BE REPORTED IMMEDIATELY:  *FEVER GREATER THAN 100.5 F  *CHILLS WITH OR WITHOUT FEVER  NAUSEA AND VOMITING THAT IS NOT CONTROLLED WITH YOUR NAUSEA MEDICATION  *UNUSUAL SHORTNESS OF BREATH  *UNUSUAL BRUISING OR BLEEDING  TENDERNESS IN MOUTH AND THROAT WITH OR WITHOUT PRESENCE OF ULCERS  *URINARY PROBLEMS  *BOWEL PROBLEMS  UNUSUAL RASH Items with * indicate a potential emergency and should be followed up as soon as possible.  Feel free to call the clinic should you have any questions or concerns. The clinic phone number is [phone].  Please show the [place] at check-in to the Emergency Department and triage nurse.

## 2016-12-23 NOTE — Progress Notes (Signed)
[facility]  [phone] [phone]  Clinic Follow up Note   Patient Care Team: [name], MD as PCP - General (Internal Medicine) [name], MD as Consulting Physician (Medical Oncology) [name], MD as Consulting Physician (General Surgery) [name], MD as Consulting Physician (Orthopedic Surgery) [name] [name], MD as Consulting Physician (Urology) 12/23/2016  SUMMARY OF ONCOLOGIC HISTORY: Oncology History   Cancer of right colon [facility])   Staging form: Colon and Rectum, AJCC 7th Edition     Pathologic stage from 01/23/2015: Stage IIC (T4b, N0, cM0) - Signed by [name], MD on 02/05/2015       Cancer of right colon ([place])   01/21/2015 Imaging     CT abdomen and pelvis showed high-grade small bowel obstruction,  right I [place] ureteral nephrosis,  multiple liver cysts.  CT chest was negative for metastatic lesion.      01/23/2015 Pathology Results     invasive adenocarcinoma extending into the pericecal connective tissue,  grade 2, LVI (-),  no perineural invasion, 3 lymph nodes were negative.      01/23/2015 Surgery      terminal ileum and cecum seegmental resection with anastomosis by Dr. Hassell Done      01/23/2015 Initial Diagnosis    Cancer of right colon ([place])      03/13/2015 - 07/06/2015 Chemotherapy    Xeloda 1500 mg bid X 14 days on-7 off, s/p 5 cycles, stopped early due to wrosening renal function      10/28/2015 Progression    PET scan showed multiple hypermetabolic peritoneal nodules, right ovarian cystic mass, and hypermetabolic liver lesion, highly suspicious for metastatic colon cancer. Small pulmonary nodules are indeterminate.       11/11/2015 - 05/20/2016 Chemotherapy    Xeloda '1500mg'$  bid, 2 weeks on and one week off, Avastin was added on from cycle 3. Xeloda held from 05/20/16 due to progression on CT from 05/19/16 and total bilirubin increased to 2.6.      01/08/2016 Imaging    Ct chest, Abdomen Pelvis  Wo Contrast 01/08/2016  IMPRESSION: 1. 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 2. Hepatic and renal cysts. 3.  Aortoiliac atherosclerotic vascular disease. 4. Lumbar spondylosis and degenerative disc disease with multilevel Impingement.      05/19/2016 Imaging    CT CAP w Contrast IMPRESSION: 1. Numerous small solid pulmonary nodules, one of which is new, and several of which have mildly increased in size since 10/28/2015, worrisome for mild progression of pulmonary metastases. 2. Solitary right liver lobe metastasis is mildly increased in size since 01/08/2016. 3. Peritoneal metastases have mildly increased in size since 01/08/2016, largest in the right pelvic sidewall. 4. Well-positioned right nephroureteral stent without overt right hydronephrosis. 5. Additional findings include aortic atherosclerosis, 1 vessel coronary atherosclerosis and mild to moderate emphysema.      06/03/2016 -  Chemotherapy    Irinotecan and Avastin every 2 weeks starting 06/03/16      07/29/2016 Tumor Marker    CEA 6.51       08/09/2016 Imaging    PET  IMPRESSION: 1. Overall mild improvement compared to FDG PET scan of 10/28/2015. 2. Pulmonary nodules are increased in size from PET-CT scan and similar size to most recent CT scan. Largest lesion RIGHT lower lobe does have associated faint metabolic activity consistent with pulmonary metastasis. 3. Decrease in metabolic activity of RIGHT hepatic lobe lesion. Potential second lesion LEFT hepatic lobe with slightly  lower metabolic activity. 4. Decreased metabolic activity at the enteric colonic anastomosis in the RIGHT lower quadrant. 5. Interval decrease in metabolic activity of peritoneal nodular metastasis. 6. Nodular implant implant along ventral abdominal wall may be within the musculature, but also improved.      11/25/2016 PET scan    PET 11/25/16 IMPRESSION: 1. Interval  worsening in the interval. Numerous pulmonary nodules are again seen. While multiple nodules are stable, there are several nodules which are a little larger in the interval. They remain too small to completely characterize with PET-CT. The 2 hepatic metastases demonstrate increased FDG uptake on today's study compared to the previous study. Abdominal wall and peritoneal metastases are mildly worsened.      CURRENT THERAPY: Irinotecan and Avastin every 2 weeks started 06/03/16  INTERVAL HISTORY: Mrs. Spadoni returns for follow-up today as scheduled with her daughter. She tolerated her last infusion of irinotecan and Avastin well on 12/02/2016. Today she feels well overall with good appetite. Mild fatigue over baseline but continues activities as normal. She had one extra week off chemotherapy which was good for the patient. She has mild constipation, controlled with miralax. She gets atropine with chemo and does not experience diarrhea. Daughter manages insomnia with melatonin, benadryl, and remeron. Her dementia is worse when she is sleep deprived, sees neurologist soon. She denies fever, chills, cough, shortness of breath, bleeding, numbness or tingling, nausea, or vomiting.   REVIEW OF SYSTEMS:   Constitutional: Denies fevers, chills. (+) mild fatigue (+) stable weight (last weight on 12/02/2016 of 149 is likely an error) Eyes: Denies blurriness of vision Ears, nose, mouth, throat, and face: Denies mucositis or sore throat Respiratory: Denies cough, dyspnea or wheezes Cardiovascular: Denies palpitation, chest discomfort or lower extremity swelling Gastrointestinal:  Denies nausea, vomiting, diarrhea, heartburn or change in bowel habits (+) constipation controlled with MiraLAX Skin: Denies abnormal skin rashes Lymphatics: Denies new lymphadenopathy or easy bruising. Denies bleeding Neurological:Denies numbness, tingling or new weaknesses (+) dementia, stable Behavioral/Psych: Mood is  stable, no new changes  All other systems were reviewed with the patient and are negative.  MEDICAL HISTORY:  Past Medical History:  Diagnosis Date  . Anxiety   . Arthritis   . Bilateral renal cysts   . Centrilobular emphysema ([place])   . Chronic low back pain   . CKD (chronic kidney disease), stage III   . Colon cancer [facility]) oncologist-  dr [name]   dx 01-23-2015  right colon Invasive adenocarcinoma into pericecal connective tissue and involving small intestine , Stage IIc, Grade 2, LVI(-),  (pT4b N0 M0) /  s/p resection termial ileum and cecum and chemotherapy 01-06-217 to 07-06-2015/   08/ 2017  per PET  recurrent w/ liver mets and peritoneal carcinomatosis  . Complication of anesthesia    can be combative due to dementia  . Full dentures   . History of gastroesophageal reflux (GERD)    changes diet  . History of pelvic fracture   . Hyperlipidemia   . Hypertension   . Liver metastasis ([place])   . 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 Pulmonary nodules   . Right ovarian cyst   . 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 UTI (urinary tract infection) 09/26/2016   5 day antibiotic treatment    SURGICAL HISTORY: Past Surgical  History:  Procedure Laterality Date  . CATARACT EXTRACTION W/ INTRAOCULAR LENS  IMPLANT, BILATERAL  2005 approx  . CYSTOSCOPY W/ URETERAL STENT PLACEMENT Right 12/03/2015   Procedure: CYSTOSCOPY WITH RETROGRADE PYELOGRAM/URETERAL STENT PLACEMENT;  Surgeon: Cleon Gustin, MD;  Location: [facility];  Service: Urology;  Laterality: Right;  . CYSTOSCOPY W/ URETERAL STENT PLACEMENT Right 02/15/2016   Procedure: CYSTOSCOPY WITH RETROGRADE PYELOGRAM/URETERAL STENT REPLACEMENT;  Surgeon: Cleon Gustin, MD;  Location: [facility];  Service:  Urology;  Laterality: Right;  . CYSTOSCOPY W/ URETERAL STENT PLACEMENT Right 03/28/2016   Procedure: CYSTOSCOPY WITH RETROGRADE PYELOGRAM/URETERAL STENT EXCHANGE;  Surgeon: Cleon Gustin, MD;  Location[facility];  Service: Urology;  Laterality: Right;  . CYSTOSCOPY W/ URETERAL STENT PLACEMENT Right 07/18/2016   Procedure: CYSTOSCOPY WITH RETROGRADE PYELOGRAM/ STENT EXCHANGE;  Surgeon: Cleon Gustin, MD;  Location: [facility];  Service: Urology;  Laterality: Right;  . CYSTOSCOPY W/ URETERAL STENT PLACEMENT Right 10/10/2016   Procedure: CYSTOSCOPY WITH RETROGRADE PYELOGRAM/URETERAL STENT REPLACEMENT;  Surgeon: Cleon Gustin, MD;  Location: [facility];  Service: Urology;  Laterality: Right;  . CYSTOSCOPY WITH RETROGRADE PYELOGRAM, URETEROSCOPY AND STENT PLACEMENT Right 09/21/2015   Procedure: CYSTOSCOPY WITH BIL RETROGRADE PYELOGRAM, URETEROSCOPY, RIGHT STENT PLACEMENT ;  Surgeon: Cleon Gustin, MD;  Location[facility];  Service: Urology;  Laterality: Right;  . LAPAROSCOPY N/A 01/23/2015   Procedure: DIAGNOSTIC LAPAROSCOPY, EXPLORATORY LAPAROTOMY  RIGHT HEMI-COLECTOMY FOR OBSTRUCTION OF RIGHT TERMINAL ILEUM;  Surgeon: [name], MD;  Location: [facility];  Service: General;  Laterality: N/A;    I have reviewed the social history and family history with the patient and they are unchanged from previous note.  ALLERGIES:  is allergic to namenda [memantine hcl] and oxycodone.  MEDICATIONS:  Current Outpatient Prescriptions  Medication Sig Dispense Refill  . amLODipine (NORVASC) 5 MG tablet Take 5 mg by mouth every morning.     . Cholecalciferol (VITAMIN D3) 2000 UNITS TABS Take by mouth daily.    . Coenzyme Q10 (CO Q 10) 100 MG CAPS Take by mouth daily.    . Lutein-Zeaxanthin 25-5 MG CAPS Take by mouth.    . Meth-Hyo-M Bl-Na Phos-Ph Sal (URIBEL) 118 MG CAPS Take 1 capsule (118 mg total) by mouth 2 (two) times daily  as needed. 30 capsule 3  . Meth-Hyo-M Bl-Na Phos-Ph Sal (URIBEL) 118 MG CAPS Take 1 capsule (118 mg total) by mouth 2 (two) times daily as needed. 30 capsule 3  . Meth-Hyo-M Bl-Na Phos-Ph Sal (URO-MP) 118 MG CAPS     . mirabegron ER (MYRBETRIQ) 50 MG TB24 tablet Take 50 mg by mouth daily.    . mirtazapine (REMERON) 15 MG tablet TAKE 1 TABLET BY MOUTH AT BEDTIME 30 tablet 3  . Multiple Vitamins-Minerals (OCUVITE-LUTEIN PO) Take 1 tablet by mouth daily.    . Multiple Vitamins-Minerals (WOMENS MULTIVITAMIN PO) Take 1 tablet by mouth daily.    Marland Kitchen MYRBETRIQ 25 MG TB24 tablet   11  . polyethylene glycol (MIRALAX / GLYCOLAX) packet Take 17 g by mouth daily. (Patient taking differently: Take 17 g by mouth daily as needed. ) 14 each 0  . traMADol (ULTRAM) 50 MG tablet Take 1 tablet (50 mg total) by mouth every 6 (six) hours as needed. 30 tablet 0  . UNABLE TO FIND Med Name: Closys Mouth Rinse two-three times daily for ulcer.    . vitamin B-12 (CYANOCOBALAMIN) 1000 MCG tablet Take  1,000 mcg by mouth daily.    . prochlorperazine (COMPAZINE) 5 MG tablet Take 1 tablet (5 mg total) by mouth every 6 (six) hours as needed for nausea or vomiting. (Patient not taking: Reported on 12/23/2016) 30 tablet 0   No current facility-administered medications for this visit.    Facility-Administered Medications Ordered in Other Visits  Medication Dose Route Frequency Provider Last Rate Last Dose  . atropine injection 0.5 mg  0.5 mg Intravenous Once PRN [name], MD      . Influenza vac split quadrivalent PF (FLUARIX) injection 0.5 mL  0.5 mL Intramuscular Once [name], MD      . irinotecan (CAMPTOSAR) 240 mg in dextrose 5 % 500 mL chemo infusion  150 mg/m2 (Treatment Plan Recorded) Intravenous Once [name], MD        PHYSICAL EXAMINATION: ECOG PERFORMANCE STATUS: 2 - Symptomatic, <50% confined to bed  Vitals:   12/23/16 0859  BP: (!) 160/76  Pulse: 80  Resp: 18  Temp: 97.6 F (36.4 C)  SpO2: 100%   Filed  Weights   12/23/16 0859  Weight: 117 lb 14.4 oz (53.5 kg)    GENERAL:alert, no distress and comfortable SKIN: skin color, texture, turgor are normal, no rashes or significant lesions EYES: normal, Conjunctiva are pink and non-injected, sclera clear OROPHARYNX:no exudate, no erythema and lips, buccal mucosa, and tongue normal  NECK: supple, thyroid normal size, non-tender, without nodularity LYMPH:  no palpable cervical or supraclavicular lymphadenopathy  LUNGS: clear to auscultation bilaterally with normal breathing effort HEART: regular rate & rhythm, S1 and S2 normal, no murmurs and no lower extremity edema ABDOMEN:abdomen soft, flat, non-tender and normal bowel sounds. No palpable organomegaly or masses. Musculoskeletal:no cyanosis of digits and no clubbing  NEURO: alert & oriented x 3 with fluent speech, no focal motor/sensory deficits   LABORATORY DATA:  I have reviewed the data as listed CBC Latest Ref Rng & Units 12/23/2016 12/02/2016 11/18/2016  WBC 3.9 - 10.3 10e3/uL 6.3 5.9 7.7  Hemoglobin 11.6 - 15.9 g/dL 12.4 11.5(L) 12.1  Hematocrit 34.8 - 46.6 % 38.7 35.8 37.1  Platelets 145 - 400 10e3/uL 276 358 322     CMP Latest Ref Rng & Units 12/23/2016 12/02/2016 11/18/2016  Glucose 70 - 140 mg/dl 55(L) 66(L) 75  BUN 7.0 - 26.0 mg/dL 18.4 23.4 18.5  Creatinine 0.6 - 1.1 mg/dL 1.2(H) 1.2(H) 1.2(H)  Sodium 136 - 145 mEq/L 143 144 143  Potassium 3.5 - 5.1 mEq/L 3.7 3.5 3.8  Chloride 101 - 111 mmol/L - - -  CO2 22 - 29 mEq/L '23 23 22  '$ Calcium 8.4 - 10.4 mg/dL 9.2 9.6 9.8  Total Protein 6.4 - 8.3 g/dL 7.4 7.6 7.9  Total Bilirubin 0.20 - 1.20 mg/dL 0.50 0.49 0.53  Alkaline Phos 40 - 150 U/L 76 88 75  AST 5 - 34 U/L '19 18 18  '$ ALT 0 - 55 U/L '14 9 11    '$ RADIOGRAPHIC STUDIES: I have personally reviewed the radiological images as listed and agreed with the findings in the report. No results found.   ASSESSMENT & PLAN: 76 y.o. African-American female, with past medical history of  moderate dementia, independent with ADLs, hypertension, presents with small bowel obstruction and weight loss.  1. 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 CKD stage III, right hydroureter  3. HTN, dementia  5. Nausea and diarrhea 8. Goal of care discussion  9.  Insomnia   Ms. Beth Wilkins appears stable today, tolerating irinotecan and avastin well overall. CBC is unremarkable, Cmet shows chronic stable creatinine elevation. Urine with mild protein. Overall adequate to proceed with treatment today. She will return in 2 weeks for f/u with Dr. Burr Medico and next cycle. Her daughter will call if the patient prefers treatment delay due to worsening comorbidities or chemo side effects.  PLAN:  -labs reviewed, proceed with next cycle irinotecan and avastin today -return in 2 weeks for MD f/u and next cycle   All questions were answered. The patient knows to call the clinic with any problems, questions or concerns. No barriers to learning was detected.    Alla Feeling, NP 12/23/16

## 2016-12-23 NOTE — Telephone Encounter (Signed)
Scheduled appt per 10/19 los - Gave patient AVS and calender per los.  

## 2017-01-02 MED FILL — AMLODIPINE BESYLATE 5 MG TA: 5 | 30 days supply | Qty: 30 | Fill #2

## 2017-01-03 NOTE — Progress Notes (Addendum)
[place]  [phone] [phone]  Clinic Follow Up Note   Patient Care Team: [name], MD as PCP - General (Internal Medicine) [name], MD as Consulting Physician (Medical Oncology) [name], MD as Consulting Physician (General Surgery) [name], MD as Consulting Physician (Orthopedic Surgery) [name] [name], MD as Consulting Physician (Urology) 01/06/2017     CHIEF COMPLAINTS:  Follow up metastatic colon cancer  Oncology History   Cancer of right colon [facility])   Staging form: Colon and Rectum, AJCC 7th Edition     Pathologic stage from 01/23/2015: Stage IIC (T4b, N0, cM0) - Signed by [name], MD on 02/05/2015       Cancer of right colon ([place])   01/21/2015 Imaging     CT abdomen and pelvis showed high-grade small bowel obstruction,  right I [place] ureteral nephrosis,  multiple liver cysts.  CT chest was negative for metastatic lesion.      01/23/2015 Pathology Results     invasive adenocarcinoma extending into the pericecal connective tissue,  grade 2, LVI (-),  no perineural invasion, 3 lymph nodes were negative.      01/23/2015 Surgery      terminal ileum and cecum seegmental resection with anastomosis by Dr. Hassell Done      01/23/2015 Initial Diagnosis    Cancer of right colon ([place])      03/13/2015 - 07/06/2015 Chemotherapy    Xeloda 1500 mg bid X 14 days on-7 off, s/p 5 cycles, stopped early due to wrosening renal function      10/28/2015 Progression    PET scan showed multiple hypermetabolic peritoneal nodules, right ovarian cystic mass, and hypermetabolic liver lesion, highly suspicious for metastatic colon cancer. Small pulmonary nodules are indeterminate.       11/11/2015 - 05/20/2016 Chemotherapy    Xeloda '1500mg'$  bid, 2 weeks on and one week off, Avastin was added on from cycle 3. Xeloda held from 05/20/16 due to progression on CT from 05/19/16 and total bilirubin increased to 2.6.      01/08/2016 Imaging    Ct chest, Abdomen Pelvis Wo Contrast 01/08/2016  IMPRESSION: 1. 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 2. Hepatic and renal cysts. 3.  Aortoiliac atherosclerotic vascular disease. 4. Lumbar spondylosis and degenerative disc disease with multilevel Impingement.      05/19/2016 Imaging    CT CAP w Contrast IMPRESSION: 1. Numerous small solid pulmonary nodules, one of which is new, and several of which have mildly increased in size since 10/28/2015, worrisome for mild progression of pulmonary metastases. 2. Solitary right liver lobe metastasis is mildly increased in size since 01/08/2016. 3. Peritoneal metastases have mildly increased in size since 01/08/2016, largest in the right pelvic sidewall. 4. Well-positioned right nephroureteral stent without overt right hydronephrosis. 5. Additional findings include aortic atherosclerosis, 1 vessel coronary atherosclerosis and mild to moderate emphysema.      06/03/2016 -  Chemotherapy    Second-Line Irinotecan and Avastin every 2 weeks starting 06/03/16      07/29/2016 Tumor Marker    CEA 6.51       08/09/2016 Imaging    PET  IMPRESSION: 1. Overall mild improvement compared to FDG PET scan of 10/28/2015. 2. Pulmonary nodules are increased in size from PET-CT scan and similar size to most recent CT scan. Largest lesion RIGHT lower lobe does have associated faint metabolic activity consistent with pulmonary metastasis. 3. Decrease in metabolic activity of RIGHT hepatic lobe lesion.  Potential second lesion LEFT hepatic lobe with slightly lower metabolic activity. 4. Decreased metabolic activity at the enteric colonic anastomosis in the RIGHT lower quadrant. 5. Interval decrease in metabolic activity of peritoneal nodular metastasis. 6. Nodular implant implant along ventral abdominal wall may be within the musculature, but also improved.      11/25/2016 PET scan    PET  11/25/16 IMPRESSION: 1. Interval worsening in the interval. Numerous pulmonary nodules are again seen. While multiple nodules are stable, there are several nodules which are a little larger in the interval. They remain too small to completely characterize with PET-CT. The 2 hepatic metastases demonstrate increased FDG uptake on today's study compared to the previous study. Abdominal wall and peritoneal metastases are mildly worsened.       HISTORY OF PRESENTING ILLNESS (02/05/2015):  Beth Wilkins 76 y.o. female  with past medical history of moderate dementia, hypertension, is here because of recently diagnosed stage II colon cancer. She is accompanied to the clinic by her daughter. History is mainly obtained from the chart and her daughter.  She has had abdominal discomfort, nausea and vomiting, and norexia for  the past to 3 months along with 15 pounds weight loss, . Her nausea was mild and intermittent, she does not seek medical attention initially. She presented with  worsening symptoms for 2-3 weeks, and episodes of projectile vomiting and dehydration to emergency room, was admitted to [facility] on 01/20/2015. CT scan reviewed small bowel obstruction, and possible appendiceal rupture. She was brought to OR on 01/23/2015, underwent right hemicolectomy with anastomosis by Dr. Hassell Done. Surgical path reviewed adenocarcinoma at the cecum. She was discharged home on 01/26/2015.  She has recovered well, eating much better than prior to surgery, move around without restriction. No pain, she has loose BM 3 times a day, and she has backed off her mirealax.  she lives with her daughter. She states her appetite is moderate, sometime low, and she refuses to eat if she doesn't have appetite. No nausea, vomiting, abdominal bloating since her surgery.  Her last colonoscopy was 6-8 years ago, and her stool OB was negative this year.   CURRENT THERAPY: Second-Line Irinotecan and Avastin every 2 weeks  started 06/03/16  INTERIM HISTORY:  Beth Wilkins is here for a follow up. She presents to the clinic today accompanied by her daughter.  She notes she is doing well. She saws she drinks and her daughter says she tricks 4-5 bottles of water plus cranberry juice. She thought the was eating well but loss 3 pounds. Her daughter thinks she needs more help at home.  The daughter mentioned trying to get in touch with home aid care for help with ADLs such as bathing. She tries to let her do as much as she can so her muscles do not atrophy. The daughter has to instruct her mother on what to do as well. The daughter reports she does take her mother with her to work or to her clients at times. Her work is very flexible. 32 hours a week with home aid care will help her daughter and the patient. The daughter would like her to seek occupational therapy to help her brain function. She tried adult daycare and the pt did not want to return. The patient thinks staying in a lot will effect her fatigue and she will end up sleeping all day and staying up at night. Her daughter thinks pt would benefit from talking with someone like a Education officer, museum.  MEDICAL HISTORY:  Past Medical History:  Diagnosis Date  . Anxiety   . Arthritis   . Bilateral renal cysts   . Centrilobular emphysema ([place])   . Chronic low back pain   . CKD (chronic kidney disease), stage III ([place])   . Colon cancer [facility]) oncologist-  dr [name]   dx 01-23-2015  right colon Invasive adenocarcinoma into pericecal connective tissue and involving small intestine , Stage IIc, Grade 2, LVI(-),  (pT4b N0 M0) /  s/p resection termial ileum and cecum and chemotherapy 01-06-217 to 07-06-2015/   08/ 2017  per PET  recurrent w/ liver mets and peritoneal carcinomatosis  . Complication of anesthesia    can be combative due to dementia  . Full dentures   . History of gastroesophageal reflux (GERD)    changes diet  . History of pelvic fracture   .  Hyperlipidemia   . Hypertension   . Liver metastasis ([place])   . 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 Pulmonary nodules   . Right ovarian cyst   . 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 UTI (urinary tract infection) 09/26/2016   5 day antibiotic treatment    SURGICAL HISTORY: Past Surgical History:  Procedure Laterality Date  . CATARACT EXTRACTION W/ INTRAOCULAR LENS  IMPLANT, BILATERAL  2005 approx  . CYSTOSCOPY W/ URETERAL STENT PLACEMENT Right 12/03/2015   Procedure: CYSTOSCOPY WITH RETROGRADE PYELOGRAM/URETERAL STENT PLACEMENT;  Surgeon: Cleon Gustin, MD;  Location: [facility];  Service: Urology;  Laterality: Right;  . CYSTOSCOPY W/ URETERAL STENT PLACEMENT Right 02/15/2016   Procedure: CYSTOSCOPY WITH RETROGRADE PYELOGRAM/URETERAL STENT REPLACEMENT;  Surgeon: Cleon Gustin, MD;  Location: [facility];  Service: Urology;  Laterality: Right;  . CYSTOSCOPY W/ URETERAL STENT PLACEMENT Right 03/28/2016   Procedure: CYSTOSCOPY WITH RETROGRADE PYELOGRAM/URETERAL STENT EXCHANGE;  Surgeon: Cleon Gustin, MD;  Location: [facility];  Service: Urology;  Laterality: Right;  . CYSTOSCOPY W/ URETERAL STENT PLACEMENT Right 07/18/2016   Procedure: CYSTOSCOPY WITH RETROGRADE PYELOGRAM/ STENT EXCHANGE;  Surgeon: Cleon Gustin, MD;  Location: [facility];  Service: Urology;  Laterality: Right;  . CYSTOSCOPY W/ URETERAL STENT PLACEMENT Right 10/10/2016   Procedure: CYSTOSCOPY WITH RETROGRADE PYELOGRAM/URETERAL STENT REPLACEMENT;  Surgeon: Cleon Gustin, MD;  Location[facility];  Service: Urology;  Laterality: Right;  . CYSTOSCOPY WITH RETROGRADE PYELOGRAM, URETEROSCOPY AND STENT PLACEMENT Right 09/21/2015    Procedure: CYSTOSCOPY WITH BIL RETROGRADE PYELOGRAM, URETEROSCOPY, RIGHT STENT PLACEMENT ;  Surgeon: Cleon Gustin, MD;  Location[facility];  Service: Urology;  Laterality: Right;  . LAPAROSCOPY N/A 01/23/2015   Procedure: DIAGNOSTIC LAPAROSCOPY, EXPLORATORY LAPAROTOMY  RIGHT HEMI-COLECTOMY FOR OBSTRUCTION OF RIGHT TERMINAL ILEUM;  Surgeon: [name], MD;  Location: [facility];  Service: General;  Laterality: N/A;    SOCIAL HISTORY: Social History   Social History  . Marital status: Single    Spouse name: N/A  . Number of children: N/A  . Years of education: N/A   Occupational History  . Not on file.   Social History Main Topics  . Smoking status: Former Smoker    Packs/day: 0.25    Years: 50.00    Types: Cigarettes    Quit date: 01/25/2015  . Smokeless tobacco: Never Used  .  Alcohol use 1.8 oz/week    2 Glasses of wine, 1 Cans of beer per week     Comment: occasional   . Drug use: No  . Sexual activity: Not on file   Other Topics Concern  . Not on file   Social History Narrative   Legally separated   Lives w/daughter, Britlyn Martine   Has total of #2 daughters and #1 son   Dementia   Fall Risk-ambulates w/cane    FAMILY HISTORY: Family History  Problem Relation Age of Onset  . Diabetes Mellitus II Father   . Cancer Mother 33       ovarian cancer   . Cervical cancer Other     ALLERGIES:  is allergic to namenda [memantine hcl] and oxycodone.  MEDICATIONS:  Current Outpatient Prescriptions  Medication Sig Dispense Refill  . amLODipine (NORVASC) 5 MG tablet Take 5 mg by mouth every morning.     . Cholecalciferol (VITAMIN D3) 2000 UNITS TABS Take by mouth daily.    . Coenzyme Q10 (CO Q 10) 100 MG CAPS Take by mouth daily.    . Lutein-Zeaxanthin 25-5 MG CAPS Take by mouth.    . Meth-Hyo-M Bl-Na Phos-Ph Sal (URIBEL) 118 MG CAPS Take 1 capsule (118 mg total) by mouth 2 (two) times daily as needed. 30 capsule 3  . Meth-Hyo-M Bl-Na Phos-Ph  Sal (URIBEL) 118 MG CAPS Take 1 capsule (118 mg total) by mouth 2 (two) times daily as needed. 30 capsule 3  . Meth-Hyo-M Bl-Na Phos-Ph Sal (URO-MP) 118 MG CAPS     . mirabegron ER (MYRBETRIQ) 50 MG TB24 tablet Take 50 mg by mouth daily.    . mirtazapine (REMERON) 15 MG tablet TAKE 1 TABLET BY MOUTH AT BEDTIME 30 tablet 3  . Multiple Vitamins-Minerals (OCUVITE-LUTEIN PO) Take 1 tablet by mouth daily.    . Multiple Vitamins-Minerals (WOMENS MULTIVITAMIN PO) Take 1 tablet by mouth daily.    Marland Kitchen MYRBETRIQ 25 MG TB24 tablet   11  . polyethylene glycol (MIRALAX / GLYCOLAX) packet Take 17 g by mouth daily. (Patient taking differently: Take 17 g by mouth daily as needed. ) 14 each 0  . prochlorperazine (COMPAZINE) 5 MG tablet Take 1 tablet (5 mg total) by mouth every 6 (six) hours as needed for nausea or vomiting. (Patient not taking: Reported on 12/23/2016) 30 tablet 0  . traMADol (ULTRAM) 50 MG tablet Take 1 tablet (50 mg total) by mouth every 6 (six) hours as needed. 30 tablet 0  . UNABLE TO FIND Med Name: Closys Mouth Rinse two-three times daily for ulcer.    . vitamin B-12 (CYANOCOBALAMIN) 1000 MCG tablet Take 1,000 mcg by mouth daily.     No current facility-administered medications for this visit.    Facility-Administered Medications Ordered in Other Visits  Medication Dose Route Frequency Provider Last Rate Last Dose  . irinotecan (CAMPTOSAR) 240 mg in dextrose 5 % 500 mL chemo infusion  150 mg/m2 (Treatment Plan Recorded) Intravenous Once [name], MD 341 mL/hr at 01/06/17 1626 240 mg at 01/06/17 1626    REVIEW OF SYSTEMS:  Constitutional: Denies fevers, chills or abnormal night sweats (+) Change in sleep cycle (+) improved appetite Eyes: Denies blurriness of vision, double vision or watery eyes  Ears, nose, mouth, throat, and face: Denies mucositis or sore throat (+) throat pin  Respiratory: Denies cough, dyspnea or wheezes Cardiovascular: Denies palpitation, chest discomfort or lower  extremity swelling Gastrointestinal:  Denies heartburn  Skin: Denies abnormal skin rashes  Lymphatics: Denies new lymphadenopathy or easy bruising Neurological:Denies numbness, tingling or new weaknesses Behavioral/Psych: Mood is stable, no new changes (+) Dementia All other systems were reviewed with the patient and are negative.  PHYSICAL EXAMINATION:  ECOG PERFORMANCE STATUS: 1 - Symptomatic but completely ambulatory  Vitals:   01/06/17 1343 01/06/17 1407  BP: (!) 147/82 (!) 147/82  Pulse: 93 93  Resp: 18 18  Temp: (!) 97.5 F (36.4 C) (!) 97.5 F (36.4 C)  SpO2: 100% 100%   Filed Weights   01/06/17 1343 01/06/17 1407  Weight: 115 lb 1.6 oz (52.2 kg) 115 lb 1.6 oz (52.2 kg)     GENERAL:alert, no distress and comfortable SKIN: skin color, texture, turgor are normal, no rashes or significant lesions EYES: normal, conjunctiva are pink and non-injected, sclera clear (+) difficult to evaluate due to her dementia OROPHARYNX:no exudate and tongue normal (+) Upper and lower dentures  NECK: supple, thyroid normal size, non-tender, without nodularity LYMPH:  no palpable lymphadenopathy in the cervical, axillary or inguinal LUNGS: clear to auscultation and percussion with normal breathing effort HEART: regular rate & rhythm and no murmurs and no lower extremity edema ABDOMEN:abdomen soft, non-tender and normal bowel sounds  Musculoskeletal:no cyanosis of digits and no clubbing  PSYCH: alert and with fluent speech, (+) dementia  NEURO: no focal motor/sensory deficits  LABORATORY DATA:  I have reviewed the data as listed CBC Latest Ref Rng & Units 01/06/2017 12/23/2016 12/02/2016  WBC 3.9 - 10.3 10e3/uL 6.0 6.3 5.9  Hemoglobin 11.6 - 15.9 g/dL 11.2(L) 12.4 11.5(L)  Hematocrit 34.8 - 46.6 % 34.4(L) 38.7 35.8  Platelets 145 - 400 10e3/uL 323 276 358    CMP Latest Ref Rng & Units 01/06/2017 12/23/2016 12/02/2016  Glucose 70 - 140 mg/dl 57(L) 55(L) 66(L)  BUN 7.0 - 26.0 mg/dL 15.4 18.4  23.4  Creatinine 0.6 - 1.1 mg/dL 1.0 1.2(H) 1.2(H)  Sodium 136 - 145 mEq/L 142 143 144  Potassium 3.5 - 5.1 mEq/L 3.6 3.7 3.5  Chloride 101 - 111 mmol/L - - -  CO2 22 - 29 mEq/L '25 23 23  '$ Calcium 8.4 - 10.4 mg/dL 9.1 9.2 9.6  Total Protein 6.4 - 8.3 g/dL 7.3 7.4 7.6  Total Bilirubin 0.20 - 1.20 mg/dL 0.49 0.50 0.49  Alkaline Phos 40 - 150 U/L 69 76 88  AST 5 - 34 U/L '16 19 18  '$ ALT 0 - 55 U/L '11 14 9    '$ CEA  01/23/2015: 5.7 06/05/2015: 6.1 08/04/2015: 10.3 10/12/2015: 13.29 (in-house)  11/04/2015: 12.83 01/13/2016: 8.57 02/24/2016: 8.41 04/27/2016: 9.22 05/20/2016: 8.32 06/17/2016: 6.45 07/15/2016: 5.07 07/29/2016: 6.51 09/09/16: 7.47 10/07/16: 7.85 11/04/16: 7.75 12/02/16: 8.13 01/06/17: PENDING    PATHOLOGY REPORT: Diagnosis 01/23/2015 Colon, segmental resection, with terminal ileum and cecum - INVASIVE ADENOCARCINOMA EXTENDING INTO PERICECAL CONNECTIVE TISSUE AND INVOLVING SMALL INTESTINE. - THREE BENIGN LYMPH NODES (0/3). - SEE ONCOLOGY TABLE BELOW.  Microscopic Comment COLON AND RECTUM (INCLUDING TRANS-ANAL RESECTION): Specimen: Terminal ileum and cecum. Procedure: Resection. Tumor site: Cecum adjacent to appendix. Specimen integrity: Intact. Macroscopic intactness of mesorectum: Not applicable. Macroscopic tumor perforation: No. Invasive tumor: Maximum size: 4.0 cm. Histologic type(s): Colorectal adenocarcinoma. Histologic grade and differentiation: G2: moderately differentiated/low grade. Type of polyp in which invasive carcinoma arose: No residual polyp. Microscopic extension of invasive tumor: Into pericecal connective tissue and terminal ileum. Lymph-Vascular invasion: Not identified. Peri-neural invasion: Not identified. Tumor deposit(s) (discontinuous extramural extension): No. Resection margins: Proximal margin: Free of tumor. Distal margin: Free of tumor. Circumferential (  radial) (posterior ascending, posterior descending; lateral and posterior mid-rectum;  and entire lower 1/3 rectum): Free of tumor. Mesenteric margin (sigmoid and transverse): N/A. Distance closest margin (if all above margins negative): N/A. Treatment effect (neo-adjuvant therapy): No. Additional polyp(s): No. Non-neoplastic findings: N/A. Lymph nodes: number examined 3; number positive: 0. Pathologic Staging: pT4b, pN0, pMX. Ancillary studies: Mismatch repair protein by immunohistochemistry pending. Comments: There is a colorectal-type adenocarcinoma arising in the cecum in the area of the appendiceal orifice. The tumor extends into the pericecal connective tissue, and involves the attached segment of terminal ileum. Immunohistochemistry shows the tumor is positive with CDX2 and cytokeratin 20, and negative with cytokeratin 7, estrogen receptor and WT-1 supporting a diagnosis of primary colorectal adenocarcinoma. ADDITIONAL INFORMATION: Mismatch Repair (MMR) Protein Immunohistochemistry (IHC) IHC Expression Result: MLH1: Preserved nuclear expression (greater 50% tumor expression) MSH2: Preserved nuclear expression (greater 50% tumor expression) MSH6: Preserved nuclear expression (greater 50% tumor expression) PMS2: Preserved nuclear expression (greater 50% tumor expression) * Internal control demonstrates intact nuclear expression Interpretation: NORMAL There is preserved expression of the major and minor MMR proteins. There is a very low probability that microsatellite instability (MSI) is present. However, certain clinically significant MMR protein mutations may result in preservation of nuclear expression. It is recommended that the preservation of protein expression be correlated with molecular based MSI testing.    RADIOGRAPHIC STUDIES: I have personally reviewed the radiological images as listed and agreed with the findings in the report.  PET 11/25/16 IMPRESSION: 1. Interval worsening in the interval. Numerous pulmonary nodules are again seen. While multiple  nodules are stable, there are several nodules which are a little larger in the interval. They remain too small to completely characterize with PET-CT. The 2 hepatic metastases demonstrate increased FDG uptake on today's study compared to the previous study. Abdominal wall and peritoneal metastases are mildly worsened.  PET 08/09/2016 IMPRESSION: 1. Overall mild improvement compared to FDG PET scan of 10/28/2015. 2. Pulmonary nodules are increased in size from PET-CT scan and similar size to most recent CT scan. Largest lesion RIGHT lower lobe does have associated faint metabolic activity consistent with pulmonary metastasis. 3. Decrease in metabolic activity of RIGHT hepatic lobe lesion. Potential second lesion LEFT hepatic lobe with slightly lower metabolic activity. 4. Decreased metabolic activity at the enteric colonic anastomosis in the RIGHT lower quadrant. 5. Interval decrease in metabolic activity of peritoneal nodular metastasis. 6. Nodular implant implant along ventral abdominal wall may be within the musculature, but also improved.  CT CAP w Contrast 05/19/2016 IMPRESSION: 1. Numerous small solid pulmonary nodules, one of which is new, and several of which have mildly increased in size since 10/28/2015, worrisome for mild progression of pulmonary metastases. 2. Solitary right liver lobe metastasis is mildly increased in size since 01/08/2016. 3. Peritoneal metastases have mildly increased in size since 01/08/2016, largest in the right pelvic sidewall. 4. Well-positioned right nephroureteral stent without overt right hydronephrosis. 5. Additional findings include aortic atherosclerosis, 1 vessel coronary atherosclerosis and mild to moderate emphysema.  Ct chest, Abdomen Pelvis Wo Contrast 01/08/2016  IMPRESSION: 1. 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 2. Hepatic and renal cysts. 3.  Aortoiliac  atherosclerotic vascular disease. 4. Lumbar spondylosis and degenerative disc disease with multilevel impingement.  ASSESSMENT & PLAN: 76 y.o. African-American female, with past medical history of moderate dementia, independent with ADLs, hypertension, presents with small bowel obstruction and weight loss.  1. Right colon adenocarcinoma from cecum, pT4bN0M0, stage IIc,  MMR normal, KRAS mutation (+), recurrent metastatic disease in 10/2015 - she initially had high risk stage II colon cancer, I have recommended adjuvant chemotherapy Xeloda  -she completed a total of 5 cycles (out of 8 cycles) Xeloda, stopped early due to her worsening renal function. -I personally reviewed her recent PET scan images, unfortunately the scan showed hypermetabolic peritoneal and liver metastases. 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 The tumor has MSI stable, she is not a candidate for immunotherapy, although she may be a candidate for clinical trial of immunotherapy plus MEK inhibitor at [facility]. -She started Xeloda '1500mg'$  bid, 2 weeks on and one week off on 11/11/15 and stopped 05/20/16 due to disease progression.  -I previously reviewed her restaging CT scan from 01/08/2016, which showed overall stable disease, no new lesions. We'll continue her chemotherapy. -I previously reviewed the CT from 05/19/2016 with the patient and her daughter in detail. She has had a moderate disease progression in lungs, liver and peritoneum. I recommended her to change treatment. -She started second line single agent irinotecan on 06/03/16 at a reduced dose '150mg'$ /m2, tolerated well. -The goal of therapy is  palliative, mainly to improve her quality of life. - PET Scan image from 08/09/16 showed good partial response to treatment. We will continue irinotecan and Avastin. -We previously discussed pt being able to take chemo break and she will notify us if needed  -I will hold Avastin if her urine protein 300 or above  -We discussed her 11/25/16 PET scan. She has small increase in size of some of her small lung nodules, and mild increased FDG of her liver mets, but overall stable disease. I think the benefit of current treatment is getting smaller. We may need to change treatment in 2 months after a repeat scan. Will continue Irinotecan and avastin for now. She agrees.  -We will consider changing her treatment to 5-fu weekly in the future if she has disease progression   -I have low threshold to stop her chemotherapy due to her comorbidities, especially dementia, if she develops more symptoms or intolerance to chemotherapy treatment. -The daughter and I discussed trying to get advanced home care to help pt and what type of help she is seeking. I also mentioned the options of physical therapy to help with her fatigue.  -I discussed she is tolerating treatment well and can continue for as long as she can tolerate. Her daughter and family agree.  -Daughter suggest pt sees Education officer, museum to have someone to talk to, I agree and will send a referral.  -Repeat scan in 02/2017 for restaging  -F/u in 4 weeks with me.    2. CKD stage III, right hydroureter  -Her Cr was around 1 in 08/2013, Cr increased to 2.4 on 01/20/2015 when she was diagnosed with colon cancer due to dehydration, improved some and moved around1.3-1.5 .  -A previous CT scan showed right hydroureter, which probably contributes to her renal insufficiency. -she had ureter stent placement again in 12/2015, 03/2016, and 04/2016 -Patient had a stent replacement on 07/19/2015 -Previously underwent renal stent exchange.  -Cr is overall stable  3. HTN,  dementia  -She will continue her medication  and follow-up with her primary care physician. -Her BP has been better controlled lately, we discussed that Avastin can cause hypertension, we'll continue monitoring closely. -Her dementia has been getting worse gradually, which has increased her care burden on her daughter. I previously discussed burn-out prevention with her daughter, she appreciated and has planned to have a care-giver for her mother  -Her BP was elevated at 146/89 (11/18/16), we repeated in infusion room. I suggest they check her BP at home often. -Her BP remains elevated so will repeat in clinic today  4. Nausea and diarrhea -Secondary to chemotherapy, overall very mild and manageable  -She is to take Imodium PRN and her nausea medications. -Nausea has improved and diarrhea has resolved.   5. Goal of care discussion  -We previously discussed the incurable nature of her cancer, and the overall poor prognosis, especially if she has progress on chemo -The patient understands the goal of care is palliative. -Due to her advanced age and comorbidities, especially dementia, I have low threshold to switch her to palliative care alone if she does not tolerate chemo well or dementia gets worse, her daughter is on -board  -I recommend DNR/DNI, pt and her daughter agrees.  -We reviewed what help she could get in home aid service or hospice. They plan to continue with chemotherapy as long as she can tolerate it. I will contact home care service and see if her insurance will cover it.   6. Insomnia  -I first suggest melatonin over-the counter and to check with her neurologist.  -If that does not help she can use prescription medication.   7. Social support -Due to her dementia and chemotherapy, she requires 24/7 care at home.  Her daughter is her only caregiver. -I will refer her to advanced home care for nursing, physical therapy, occupational therapy and home aids for bathing etc, referral  was made today  -Social worker referral to discuss additional social services for pt   Plan -Lab reviewed, adequate to proceed with treatment with irinotecan and Avastin today  -lab, flush, f/u and chemo irinotecan and avastin in 2 and 4 weeks.  She will see NP Lacie in 2 weeks and see me in 4 weeks -SW referral in 2 or 4 weeks with her treatment  -Plan to order her restaging scan in 4 weeks   All questions were answered. The patient knows to call the clinic with any problems, questions or concerns.  I spent 20 minutes counseling the patient face to face. The total time spent in the appointment was 25 minutes and more than 50% was on counseling.  This document serves as a record of services personally performed by [name], MD. It was created on her behalf by Joslyn Devon, a trained medical scribe. The creation of this record is based on the scribe's personal observations and the provider's statements to them. This document has been checked and approved by the attending provider.   [name]  01/06/2017 5:53 PM

## 2017-01-06 ENCOUNTER — Telehealth: Payer: Self-pay | Admitting: Hematology

## 2017-01-06 ENCOUNTER — Encounter: Payer: Self-pay | Admitting: Hematology

## 2017-01-06 ENCOUNTER — Other Ambulatory Visit (HOSPITAL_BASED_OUTPATIENT_CLINIC_OR_DEPARTMENT_OTHER): Payer: Medicare Other

## 2017-01-06 ENCOUNTER — Ambulatory Visit (HOSPITAL_BASED_OUTPATIENT_CLINIC_OR_DEPARTMENT_OTHER): Payer: Medicare Other | Admitting: Hematology

## 2017-01-06 ENCOUNTER — Other Ambulatory Visit: Payer: Self-pay | Admitting: *Deleted

## 2017-01-06 ENCOUNTER — Ambulatory Visit (HOSPITAL_BASED_OUTPATIENT_CLINIC_OR_DEPARTMENT_OTHER): Payer: Medicare Other

## 2017-01-06 ENCOUNTER — Telehealth: Payer: Self-pay | Admitting: *Deleted

## 2017-01-06 VITALS — BP 143/71 | HR 98

## 2017-01-06 VITALS — BP 147/82 | HR 93 | Temp 97.5°F | Resp 18 | Ht 66.0 in | Wt 115.1 lb

## 2017-01-06 DIAGNOSIS — F0151 Vascular dementia with behavioral disturbance: Secondary | ICD-10-CM

## 2017-01-06 DIAGNOSIS — N183 Chronic kidney disease, stage 3 (moderate): Secondary | ICD-10-CM | POA: Diagnosis not present

## 2017-01-06 DIAGNOSIS — C180 Malignant neoplasm of cecum: Secondary | ICD-10-CM

## 2017-01-06 DIAGNOSIS — Z5111 Encounter for antineoplastic chemotherapy: Secondary | ICD-10-CM | POA: Diagnosis not present

## 2017-01-06 DIAGNOSIS — I10 Essential (primary) hypertension: Secondary | ICD-10-CM

## 2017-01-06 DIAGNOSIS — Z5112 Encounter for antineoplastic immunotherapy: Secondary | ICD-10-CM | POA: Diagnosis not present

## 2017-01-06 DIAGNOSIS — C182 Malignant neoplasm of ascending colon: Secondary | ICD-10-CM

## 2017-01-06 DIAGNOSIS — C786 Secondary malignant neoplasm of retroperitoneum and peritoneum: Secondary | ICD-10-CM | POA: Diagnosis not present

## 2017-01-06 DIAGNOSIS — F0390 Unspecified dementia without behavioral disturbance: Secondary | ICD-10-CM | POA: Diagnosis not present

## 2017-01-06 DIAGNOSIS — F01518 Vascular dementia, unspecified severity, with other behavioral disturbance: Secondary | ICD-10-CM

## 2017-01-06 LAB — COMPREHENSIVE METABOLIC PANEL
ALT: 11 U/L (ref 0–55)
AST: 16 U/L (ref 5–34)
Albumin: 3.3 g/dL — ABNORMAL LOW (ref 3.5–5.0)
Alkaline Phosphatase: 69 U/L (ref 40–150)
Anion Gap: 8 mEq/L (ref 3–11)
BILIRUBIN TOTAL: 0.49 mg/dL (ref 0.20–1.20)
BUN: 15.4 mg/dL (ref 7.0–26.0)
CO2: 25 mEq/L (ref 22–29)
Calcium: 9.1 mg/dL (ref 8.4–10.4)
Chloride: 109 mEq/L (ref 98–109)
Creatinine: 1 mg/dL (ref 0.6–1.1)
GLUCOSE: 57 mg/dL — AB (ref 70–140)
Potassium: 3.6 mEq/L (ref 3.5–5.1)
SODIUM: 142 meq/L (ref 136–145)
TOTAL PROTEIN: 7.3 g/dL (ref 6.4–8.3)

## 2017-01-06 LAB — CBC WITH DIFFERENTIAL/PLATELET
BASO%: 1.1 % (ref 0.0–2.0)
Basophils Absolute: 0.1 10*3/uL (ref 0.0–0.1)
EOS ABS: 0.3 10*3/uL (ref 0.0–0.5)
EOS%: 5.4 % (ref 0.0–7.0)
HCT: 34.4 % — ABNORMAL LOW (ref 34.8–46.6)
HGB: 11.2 g/dL — ABNORMAL LOW (ref 11.6–15.9)
LYMPH%: 20 % (ref 14.0–49.7)
MCH: 28.6 pg (ref 25.1–34.0)
MCHC: 32.5 g/dL (ref 31.5–36.0)
MCV: 87.9 fL (ref 79.5–101.0)
MONO#: 0.5 10*3/uL (ref 0.1–0.9)
MONO%: 7.9 % (ref 0.0–14.0)
NEUT%: 65.6 % (ref 38.4–76.8)
NEUTROS ABS: 3.9 10*3/uL (ref 1.5–6.5)
Platelets: 323 10*3/uL (ref 145–400)
RBC: 3.91 10*6/uL (ref 3.70–5.45)
RDW: 20.5 % — ABNORMAL HIGH (ref 11.2–14.5)
WBC: 6 10*3/uL (ref 3.9–10.3)
lymph#: 1.2 10*3/uL (ref 0.9–3.3)

## 2017-01-06 LAB — UA PROTEIN, DIPSTICK - CHCC: Protein, ur: 30 mg/dL

## 2017-01-06 LAB — CEA (IN HOUSE-CHCC): CEA (CHCC-In House): 10.36 ng/mL — ABNORMAL HIGH (ref 0.00–5.00)

## 2017-01-06 MED ORDER — SODIUM CHLORIDE 0.9 % IV SOLN
5.0000 mg/kg | Freq: Once | INTRAVENOUS | Status: AC
  Administered 2017-01-06: 275 mg via INTRAVENOUS
  Filled 2017-01-06: qty 11

## 2017-01-06 MED ORDER — HEPARIN SOD (PORK) LOCK FLUSH 100 UNIT/ML IV SOLN
500.0000 [IU] | Freq: Once | INTRAVENOUS | Status: AC
  Filled 2017-01-06: qty 5

## 2017-01-06 MED ORDER — ATROPINE SULFATE 1 MG/ML IJ SOLN
0.5000 mg | Freq: Once | INTRAMUSCULAR | Status: AC | PRN
  Administered 2017-01-06: 0.5 mg via INTRAVENOUS

## 2017-01-06 MED ORDER — ATROPINE SULFATE 1 MG/ML IJ SOLN
INTRAMUSCULAR | Status: AC
  Filled 2017-01-06: qty 1

## 2017-01-06 MED ORDER — DEXAMETHASONE SODIUM PHOSPHATE 10 MG/ML IJ SOLN
INTRAMUSCULAR | Status: AC
  Filled 2017-01-06: qty 1

## 2017-01-06 MED ORDER — PALONOSETRON HCL INJECTION 0.25 MG/5ML
INTRAVENOUS | Status: AC
  Filled 2017-01-06: qty 5

## 2017-01-06 MED ORDER — PALONOSETRON HCL INJECTION 0.25 MG/5ML
0.2500 mg | Freq: Once | INTRAVENOUS | Status: AC
  Administered 2017-01-06: 0.25 mg via INTRAVENOUS

## 2017-01-06 MED ORDER — SODIUM CHLORIDE 0.9 % IV SOLN
Freq: Once | INTRAVENOUS | Status: AC
  Administered 2017-01-06: 15:00:00 via INTRAVENOUS

## 2017-01-06 MED ORDER — IRINOTECAN HCL CHEMO INJECTION 100 MG/5ML
150.0000 mg/m2 | Freq: Once | INTRAVENOUS | Status: AC
  Administered 2017-01-06: 240 mg via INTRAVENOUS
  Filled 2017-01-06: qty 2

## 2017-01-06 MED ORDER — DEXAMETHASONE SODIUM PHOSPHATE 10 MG/ML IJ SOLN
10.0000 mg | Freq: Once | INTRAMUSCULAR | Status: AC
  Administered 2017-01-06: 10 mg via INTRAVENOUS

## 2017-01-06 NOTE — Telephone Encounter (Signed)
Called SW and left a message

## 2017-01-06 NOTE — Telephone Encounter (Signed)
Gave avs and calendar for November  °

## 2017-01-06 NOTE — Patient Instructions (Signed)
[facility] Discharge Instructions for Patients Receiving Chemotherapy  Today you received the following chemotherapy agents :  Avastin,  Irinotecan.  To help prevent nausea and vomiting after your treatment, we encourage you to take your nausea medication as prescribed.   If you develop nausea and vomiting that is not controlled by your nausea medication, call the clinic.   BELOW ARE SYMPTOMS THAT SHOULD BE REPORTED IMMEDIATELY:  *FEVER GREATER THAN 100.5 F  *CHILLS WITH OR WITHOUT FEVER  NAUSEA AND VOMITING THAT IS NOT CONTROLLED WITH YOUR NAUSEA MEDICATION  *UNUSUAL SHORTNESS OF BREATH  *UNUSUAL BRUISING OR BLEEDING  TENDERNESS IN MOUTH AND THROAT WITH OR WITHOUT PRESENCE OF ULCERS  *URINARY PROBLEMS  *BOWEL PROBLEMS  UNUSUAL RASH Items with * indicate a potential emergency and should be followed up as soon as possible.  Feel free to call the clinic should you have any questions or concerns. The clinic phone number is [phone].  Please show the CHEMO ALERT CARD at check-in to the Emergency Department and triage nurse.   

## 2017-01-06 NOTE — Telephone Encounter (Signed)
Referral made to [facility] for OT/PP/HH aide in pt with met colon cancer & mod dementia.-Eval & treat.  Message left on vm for Ms Nussbaum/RN/[facility] regarding referral.

## 2017-01-07 ENCOUNTER — Other Ambulatory Visit: Payer: Self-pay | Admitting: Hematology

## 2017-01-12 ENCOUNTER — Telehealth: Payer: Self-pay

## 2017-01-12 ENCOUNTER — Encounter: Payer: Self-pay | Admitting: *Deleted

## 2017-01-12 ENCOUNTER — Encounter: Payer: Self-pay | Admitting: Neurology

## 2017-01-12 NOTE — Progress Notes (Signed)
[facility]  Clinical Social Work was referred by Futures trader for assessment of psychosocial needs; specifically home care needs.  Clinical Social Worker contacted patient's daughter by phone to offer support and assess for needs.  Beth Wilkins reported she has spent several hours on the phone with [name] learning how to get home care services covered.  She shared she needs RN visit weekly, OT/PT evals, and SW eval from [name].  Dr. Burr Medico has already placed order for [name] needs.  Patient's daughter reports after evals completed, she may need additional "prescription" from Dr. Burr Medico for home care aid 32 hours/week due to patient's needs.  Patient's daughter may follow up with CSW/medical team to get prescription and follow up on progress with [name].   [name], MSW, LCSW, OSW-C Clinical Social Worker [facility] [phone]

## 2017-01-12 NOTE — Telephone Encounter (Signed)
Holly with [facility] called to let Dr Burr Medico know pt deferred her start of care to Monday.

## 2017-01-16 ENCOUNTER — Telehealth: Payer: Self-pay | Admitting: *Deleted

## 2017-01-16 NOTE — Telephone Encounter (Signed)
TC received from [place], [place] @ [facility].  She states she made a visit to pt's home today as a new referral. Beth Wilkins states that daughter is really more interested in [facility] (personal care services) so that she can go back to work. PCS can stay in the home longer than traditional home health aide. Beth Wilkins has contacted [employer] (they do provide PCS) regarding this. Beth Wilkins is currently processing insurance but they will need a referral fax'd to them for RN/PT/OT and SW as well as PCS. Fax # is [phone]

## 2017-01-16 NOTE — Telephone Encounter (Signed)
Myrtle, could you fax them the referral orders? Thanks.   [name] MD

## 2017-01-18 ENCOUNTER — Other Ambulatory Visit: Payer: Self-pay | Admitting: Neurology

## 2017-01-18 MED FILL — MIRTAZAPINE 15 MG TAB: 15 | 30 days supply | Qty: 30 | Fill #0

## 2017-01-18 MED FILL — MYRBETRIQ ER 25 MG TABLET: 25 | 30 days supply | Qty: 30 | Fill #3

## 2017-01-19 ENCOUNTER — Other Ambulatory Visit: Payer: Self-pay | Admitting: Hematology

## 2017-01-20 ENCOUNTER — Ambulatory Visit (HOSPITAL_BASED_OUTPATIENT_CLINIC_OR_DEPARTMENT_OTHER): Payer: Medicare Other

## 2017-01-20 ENCOUNTER — Ambulatory Visit (HOSPITAL_BASED_OUTPATIENT_CLINIC_OR_DEPARTMENT_OTHER): Payer: Medicare Other | Admitting: Nurse Practitioner

## 2017-01-20 ENCOUNTER — Telehealth: Payer: Self-pay | Admitting: Nurse Practitioner

## 2017-01-20 ENCOUNTER — Encounter: Payer: Self-pay | Admitting: Nurse Practitioner

## 2017-01-20 ENCOUNTER — Other Ambulatory Visit (HOSPITAL_BASED_OUTPATIENT_CLINIC_OR_DEPARTMENT_OTHER): Payer: Medicare Other

## 2017-01-20 VITALS — BP 153/84 | HR 86 | Temp 97.8°F | Resp 18 | Ht 66.0 in | Wt 116.6 lb

## 2017-01-20 VITALS — BP 148/76

## 2017-01-20 DIAGNOSIS — Z5112 Encounter for antineoplastic immunotherapy: Secondary | ICD-10-CM | POA: Diagnosis not present

## 2017-01-20 DIAGNOSIS — C182 Malignant neoplasm of ascending colon: Secondary | ICD-10-CM

## 2017-01-20 DIAGNOSIS — C786 Secondary malignant neoplasm of retroperitoneum and peritoneum: Secondary | ICD-10-CM

## 2017-01-20 DIAGNOSIS — D638 Anemia in other chronic diseases classified elsewhere: Secondary | ICD-10-CM

## 2017-01-20 DIAGNOSIS — Z79899 Other long term (current) drug therapy: Secondary | ICD-10-CM

## 2017-01-20 DIAGNOSIS — C180 Malignant neoplasm of cecum: Secondary | ICD-10-CM

## 2017-01-20 DIAGNOSIS — F0390 Unspecified dementia without behavioral disturbance: Secondary | ICD-10-CM | POA: Diagnosis not present

## 2017-01-20 DIAGNOSIS — I10 Essential (primary) hypertension: Secondary | ICD-10-CM

## 2017-01-20 DIAGNOSIS — Z5111 Encounter for antineoplastic chemotherapy: Secondary | ICD-10-CM | POA: Diagnosis not present

## 2017-01-20 DIAGNOSIS — N183 Chronic kidney disease, stage 3 (moderate): Secondary | ICD-10-CM | POA: Diagnosis not present

## 2017-01-20 LAB — COMPREHENSIVE METABOLIC PANEL
ALBUMIN: 3.4 g/dL — AB (ref 3.5–5.0)
ALK PHOS: 71 U/L (ref 40–150)
ALT: 13 U/L (ref 0–55)
AST: 17 U/L (ref 5–34)
Anion Gap: 11 mEq/L (ref 3–11)
BILIRUBIN TOTAL: 0.46 mg/dL (ref 0.20–1.20)
BUN: 15.1 mg/dL (ref 7.0–26.0)
CALCIUM: 9.1 mg/dL (ref 8.4–10.4)
CO2: 21 mEq/L — ABNORMAL LOW (ref 22–29)
CREATININE: 1.2 mg/dL — AB (ref 0.6–1.1)
Chloride: 108 mEq/L (ref 98–109)
EGFR: 52 mL/min/{1.73_m2} — ABNORMAL LOW (ref >60)
GLUCOSE: 86 mg/dL (ref 70–140)
Potassium: 3.6 mEq/L (ref 3.5–5.1)
Sodium: 139 mEq/L (ref 136–145)
TOTAL PROTEIN: 7.4 g/dL (ref 6.4–8.3)

## 2017-01-20 LAB — CBC WITH DIFFERENTIAL/PLATELET
BASO%: 0.5 % (ref 0.0–2.0)
Basophils Absolute: 0 10*3/uL (ref 0.0–0.1)
EOS%: 2 % (ref 0.0–7.0)
Eosinophils Absolute: 0.1 10*3/uL (ref 0.0–0.5)
HCT: 37.2 % (ref 34.8–46.6)
HGB: 12 g/dL (ref 11.6–15.9)
LYMPH%: 21.1 % (ref 14.0–49.7)
MCH: 28.9 pg (ref 25.1–34.0)
MCHC: 32.3 g/dL (ref 31.5–36.0)
MCV: 89.6 fL (ref 79.5–101.0)
MONO#: 0.5 10*3/uL (ref 0.1–0.9)
MONO%: 7.1 % (ref 0.0–14.0)
NEUT%: 69.3 % (ref 38.4–76.8)
NEUTROS ABS: 4.6 10*3/uL (ref 1.5–6.5)
Platelets: 366 10*3/uL (ref 145–400)
RBC: 4.15 10*6/uL (ref 3.70–5.45)
RDW: 19 % — ABNORMAL HIGH (ref 11.2–14.5)
WBC: 6.6 10*3/uL (ref 3.9–10.3)
lymph#: 1.4 10*3/uL (ref 0.9–3.3)
nRBC: 0 % (ref 0–0)

## 2017-01-20 LAB — UA PROTEIN, DIPSTICK - CHCC: Protein, ur: 100 mg/dL

## 2017-01-20 MED ORDER — SODIUM CHLORIDE 0.9 % IV SOLN
Freq: Once | INTRAVENOUS | Status: AC
  Administered 2017-01-20: 11:00:00 via INTRAVENOUS

## 2017-01-20 MED ORDER — PALONOSETRON HCL INJECTION 0.25 MG/5ML
0.2500 mg | Freq: Once | INTRAVENOUS | Status: AC
  Administered 2017-01-20: 0.25 mg via INTRAVENOUS

## 2017-01-20 MED ORDER — DEXAMETHASONE SODIUM PHOSPHATE 10 MG/ML IJ SOLN
INTRAMUSCULAR | Status: AC
  Filled 2017-01-20: qty 1

## 2017-01-20 MED ORDER — PALONOSETRON HCL INJECTION 0.25 MG/5ML
INTRAVENOUS | Status: AC
  Filled 2017-01-20: qty 5

## 2017-01-20 MED ORDER — ATROPINE SULFATE 1 MG/ML IJ SOLN
0.5000 mg | Freq: Once | INTRAMUSCULAR | Status: AC | PRN
  Administered 2017-01-20: 0.5 mg via INTRAVENOUS

## 2017-01-20 MED ORDER — SODIUM CHLORIDE 0.9 % IV SOLN
5.0000 mg/kg | Freq: Once | INTRAVENOUS | Status: AC
  Administered 2017-01-20: 275 mg via INTRAVENOUS
  Filled 2017-01-20: qty 11

## 2017-01-20 MED ORDER — IRINOTECAN HCL CHEMO INJECTION 100 MG/5ML
150.0000 mg/m2 | Freq: Once | INTRAVENOUS | Status: AC
  Administered 2017-01-20: 240 mg via INTRAVENOUS
  Filled 2017-01-20: qty 12

## 2017-01-20 MED ORDER — DEXAMETHASONE SODIUM PHOSPHATE 10 MG/ML IJ SOLN
10.0000 mg | Freq: Once | INTRAMUSCULAR | Status: AC
  Administered 2017-01-20: 10 mg via INTRAVENOUS

## 2017-01-20 MED ORDER — ATROPINE SULFATE 1 MG/ML IJ SOLN
INTRAMUSCULAR | Status: AC
  Filled 2017-01-20: qty 1

## 2017-01-20 NOTE — Patient Instructions (Signed)
[facility] Discharge Instructions for Patients Receiving Chemotherapy  Today you received the following chemotherapy agents :  Avastin,  Irinotecan.  To help prevent nausea and vomiting after your treatment, we encourage you to take your nausea medication as prescribed.   If you develop nausea and vomiting that is not controlled by your nausea medication, call the clinic.   BELOW ARE SYMPTOMS THAT SHOULD BE REPORTED IMMEDIATELY:  *FEVER GREATER THAN 100.5 F  *CHILLS WITH OR WITHOUT FEVER  NAUSEA AND VOMITING THAT IS NOT CONTROLLED WITH YOUR NAUSEA MEDICATION  *UNUSUAL SHORTNESS OF BREATH  *UNUSUAL BRUISING OR BLEEDING  TENDERNESS IN MOUTH AND THROAT WITH OR WITHOUT PRESENCE OF ULCERS  *URINARY PROBLEMS  *BOWEL PROBLEMS  UNUSUAL RASH Items with * indicate a potential emergency and should be followed up as soon as possible.  Feel free to call the clinic should you have any questions or concerns. The clinic phone number is [phone].  Please show the CHEMO ALERT CARD at check-in to the Emergency Department and triage nurse.   

## 2017-01-20 NOTE — Progress Notes (Signed)
[place]  [phone] [phone]  Clinic Follow up Note   Patient Care Team: [name], MD as PCP - General (Internal Medicine) [name], MD as Consulting Physician (Medical Oncology) [name], MD as Consulting Physician (General Surgery) [name], MD as Consulting Physician (Orthopedic Surgery) [name] [name], MD as Consulting Physician (Urology) 01/20/2017  SUMMARY OF ONCOLOGIC HISTORY: Oncology History   Cancer of right colon [facility])   Staging form: Colon and Rectum, AJCC 7th Edition     Pathologic stage from 01/23/2015: Stage IIC (T4b, N0, cM0) - Signed by [name], MD on 02/05/2015       Cancer of right colon ([place])   01/21/2015 Imaging     CT abdomen and pelvis showed high-grade small bowel obstruction,  right I [place] ureteral nephrosis,  multiple liver cysts.  CT chest was negative for metastatic lesion.      01/23/2015 Pathology Results     invasive adenocarcinoma extending into the pericecal connective tissue,  grade 2, LVI (-),  no perineural invasion, 3 lymph nodes were negative.      01/23/2015 Surgery      terminal ileum and cecum seegmental resection with anastomosis by Dr. Hassell Done      01/23/2015 Initial Diagnosis    Cancer of right colon ([place])      03/13/2015 - 07/06/2015 Chemotherapy    Xeloda 1500 mg bid X 14 days on-7 off, s/p 5 cycles, stopped early due to wrosening renal function      10/28/2015 Progression    PET scan showed multiple hypermetabolic peritoneal nodules, right ovarian cystic mass, and hypermetabolic liver lesion, highly suspicious for metastatic colon cancer. Small pulmonary nodules are indeterminate.       11/11/2015 - 05/20/2016 Chemotherapy    Xeloda '1500mg'$  bid, 2 weeks on and one week off, Avastin was added on from cycle 3. Xeloda held from 05/20/16 due to progression on CT from 05/19/16 and total bilirubin increased to 2.6.      01/08/2016 Imaging    Ct chest, Abdomen Pelvis  Wo Contrast 01/08/2016  IMPRESSION: 1. 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 2. Hepatic and renal cysts. 3.  Aortoiliac atherosclerotic vascular disease. 4. Lumbar spondylosis and degenerative disc disease with multilevel Impingement.      05/19/2016 Imaging    CT CAP w Contrast IMPRESSION: 1. Numerous small solid pulmonary nodules, one of which is new, and several of which have mildly increased in size since 10/28/2015, worrisome for mild progression of pulmonary metastases. 2. Solitary right liver lobe metastasis is mildly increased in size since 01/08/2016. 3. Peritoneal metastases have mildly increased in size since 01/08/2016, largest in the right pelvic sidewall. 4. Well-positioned right nephroureteral stent without overt right hydronephrosis. 5. Additional findings include aortic atherosclerosis, 1 vessel coronary atherosclerosis and mild to moderate emphysema.      06/03/2016 -  Chemotherapy    Second-Line Irinotecan and Avastin every 2 weeks starting 06/03/16      07/29/2016 Tumor Marker    CEA 6.51       08/09/2016 Imaging    PET  IMPRESSION: 1. Overall mild improvement compared to FDG PET scan of 10/28/2015. 2. Pulmonary nodules are increased in size from PET-CT scan and similar size to most recent CT scan. Largest lesion RIGHT lower lobe does have associated faint metabolic activity consistent with pulmonary metastasis. 3. Decrease in metabolic activity of RIGHT hepatic lobe lesion. Potential second lesion LEFT hepatic lobe with  slightly lower metabolic activity. 4. Decreased metabolic activity at the enteric colonic anastomosis in the RIGHT lower quadrant. 5. Interval decrease in metabolic activity of peritoneal nodular metastasis. 6. Nodular implant implant along ventral abdominal wall may be within the musculature, but also improved.      11/25/2016 PET scan    PET 11/25/16 IMPRESSION: 1.  Interval worsening in the interval. Numerous pulmonary nodules are again seen. While multiple nodules are stable, there are several nodules which are a little larger in the interval. They remain too small to completely characterize with PET-CT. The 2 hepatic metastases demonstrate increased FDG uptake on today's study compared to the previous study. Abdominal wall and peritoneal metastases are mildly worsened.     CURRENT THERAPY: Second-Line Irinotecan and Avastin every 2 weeks started 06/03/16  INTERVAL HISTORY: Ms. Chow returns for follow-up as scheduled with her daughter for next cycle of chemotherapy.  Tolerated last cycle on 01/06/17 without difficulty.  Mild constipation managed with MiraLAX, last bowel movement 01/19/2017, no nausea, vomiting, diarrhea.  Denies bleeding or abdominal pain.  Mild exertional dyspnea, stable and resolves quickly with rest.  Patient continues to have poor sleep, despite mirtazapine, melatonin, Benadryl, lavender essential oil, and warm heated mattress.  She denies functional decline or neurologic changes; will follow up with neurologist Dr. Delice Lesch on 01/24/2017.  Daughter is having difficulty getting home health approved by insurance, she will follow up with social worker today during infusion appointment.  REVIEW OF SYSTEMS:   Constitutional: Denies fatigue, fevers, chills or abnormal weight loss (+) appetite Eyes: Denies blurriness of vision Ears, nose, mouth, throat, and face: Denies mucositis or sore throat Respiratory: Denies cough or wheezes (+) mild exertional dyspnea, resolves quickly with rest Cardiovascular: Denies palpitation, chest discomfort or lower extremity swelling Gastrointestinal:  Denies nausea, vomiting, diarrhea, heartburn, abdominal pain, or change in bowel habits (+) mild constipation, controlled with MiraLAX; last BM 01/19/2017 Skin: Denies abnormal skin rashes Lymphatics: Denies new lymphadenopathy or easy  bruising Neurological:Denies numbness, tingling, new weaknesses, or neurological changes (+) insomnia (+) ambulates with cane Behavioral/Psych: Mood is stable, no new changes.  Denies irritability All other systems were reviewed with the patient and are negative.  MEDICAL HISTORY:  Past Medical History:  Diagnosis Date  . Anxiety   . Arthritis   . Bilateral renal cysts   . Centrilobular emphysema ([place])   . Chronic low back pain   . CKD (chronic kidney disease), stage III ([place])   . Colon cancer [facility]) oncologist-  dr [name]   dx 01-23-2015  right colon Invasive adenocarcinoma into pericecal connective tissue and involving small intestine , Stage IIc, Grade 2, LVI(-),  (pT4b N0 M0) /  s/p resection termial ileum and cecum and chemotherapy 01-06-217 to 07-06-2015/   08/ 2017  per PET  recurrent w/ liver mets and peritoneal carcinomatosis  . Complication of anesthesia    can be combative due to dementia  . Full dentures   . History of gastroesophageal reflux (GERD)    changes diet  . History of pelvic fracture   . Hyperlipidemia   . Hypertension   . Liver metastasis ([place])   . 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 Pulmonary nodules   . Right ovarian cyst   . 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 UTI (urinary tract  infection) 09/26/2016   5 day antibiotic treatment    SURGICAL HISTORY: Past Surgical History:  Procedure Laterality Date  . CATARACT EXTRACTION W/ INTRAOCULAR LENS  IMPLANT, BILATERAL  2005 approx  . CYSTOSCOPY WITH BIL RETROGRADE PYELOGRAM, URETEROSCOPY, RIGHT STENT PLACEMENT Right 09/21/2015   Performed by Cleon Gustin, MD at [facility]  . CYSTOSCOPY WITH RETROGRADE PYELOGRAM/ STENT EXCHANGE Right 07/18/2016   Performed by Cleon Gustin, MD at  [facility]  . CYSTOSCOPY WITH RETROGRADE PYELOGRAM/URETERAL STENT EXCHANGE Right 03/28/2016   Performed by Cleon Gustin, MD at [facility]  . CYSTOSCOPY WITH RETROGRADE PYELOGRAM/URETERAL STENT PLACEMENT Right 12/03/2015   Performed by Cleon Gustin, MD at [facility]  . CYSTOSCOPY WITH RETROGRADE PYELOGRAM/URETERAL STENT REPLACEMENT Right 10/10/2016   Performed by Cleon Gustin, MD at [facility]  . CYSTOSCOPY WITH RETROGRADE PYELOGRAM/URETERAL STENT REPLACEMENT Right 02/15/2016   Performed by Cleon Gustin, MD at [facility]  . DIAGNOSTIC LAPAROSCOPY, EXPLORATORY LAPAROTOMY  RIGHT HEMI-COLECTOMY FOR OBSTRUCTION OF RIGHT TERMINAL ILEUM N/A 01/23/2015   Performed by [name], MD at [facility]    I have reviewed the social history and family history with the patient and they are unchanged from previous note.  ALLERGIES:  is allergic to namenda [memantine hcl] and oxycodone.  MEDICATIONS:  Current Outpatient Medications  Medication Sig Dispense Refill  . amLODipine (NORVASC) 5 MG tablet Take 5 mg by mouth every morning.     . Cholecalciferol (VITAMIN D3) 2000 UNITS TABS Take by mouth daily.    . Coenzyme Q10 (CO Q 10) 100 MG CAPS Take by mouth daily.    . Lutein-Zeaxanthin 25-5 MG CAPS Take by mouth.    . Meth-Hyo-M Bl-Na Phos-Ph Sal (URIBEL) 118 MG CAPS Take 1 capsule (118 mg total) by mouth 2 (two) times daily as needed. 30 capsule 3  . Meth-Hyo-M Bl-Na Phos-Ph Sal (URO-MP) 118 MG CAPS     . mirabegron ER (MYRBETRIQ) 50 MG TB24 tablet Take 50 mg by mouth daily.    . mirtazapine (REMERON) 15 MG tablet TAKE 1 TABLET BY MOUTH AT BEDTIME 30 tablet 3  . Multiple Vitamins-Minerals (OCUVITE-LUTEIN PO) Take 1 tablet by mouth daily.    . Multiple Vitamins-Minerals (WOMENS MULTIVITAMIN PO) Take 1 tablet by mouth daily.    . traMADol (ULTRAM) 50 MG tablet Take 1 tablet (50 mg total) by mouth every 6  (six) hours as needed. 30 tablet 0  . UNABLE TO FIND Med Name: Closys Mouth Rinse two-three times daily for ulcer.    . vitamin B-12 (CYANOCOBALAMIN) 1000 MCG tablet Take 1,000 mcg by mouth daily.    Marland Kitchen MYRBETRIQ 25 MG TB24 tablet   11  . polyethylene glycol (MIRALAX / GLYCOLAX) packet Take 17 g by mouth daily. (Patient taking differently: Take 17 g by mouth daily as needed. ) 14 each 0  . prochlorperazine (COMPAZINE) 5 MG tablet Take 1 tablet (5 mg total) by mouth every 6 (six) hours as needed for nausea or vomiting. (Patient not taking: Reported on 12/23/2016) 30 tablet 0   No current facility-administered medications for this visit.    Facility-Administered Medications Ordered in Other Visits  Medication Dose Route Frequency Provider Last Rate Last Dose  . heparin lock flush 100 unit/mL  500 Units Intravenous Once [name], MD        PHYSICAL EXAMINATION: ECOG PERFORMANCE STATUS: 1 - Symptomatic but completely ambulatory  Vitals:   01/20/17 0931  BP: (!) 153/84  Pulse: 86  Resp: 18  Temp: 97.8 F (36.6 C)  SpO2: 100%   Filed Weights   01/20/17 0931  Weight: 116 lb 9.6 oz (52.9 kg)    GENERAL:alert, no distress and comfortable SKIN: skin color, texture, turgor are normal, no rashes or significant lesions EYES: normal, Conjunctiva are pink and non-injected, sclera clear OROPHARYNX:no exudate, no erythema and lips, buccal mucosa, and tongue normal  NECK: supple, thyroid normal size, non-tender, without nodularity LYMPH:  no palpable cervical or supraclavicular lymphadenopathy  LUNGS: clear to auscultation laterally with normal breathing effort HEART: regular rate & rhythm and no murmurs and no lower extremity edema ABDOMEN:abdomen soft, non-tender and normal bowel sounds.  No palpable hepatomegaly Musculoskeletal:no cyanosis of digits and no clubbing  NEURO: alert & oriented x 3 with fluent speech, no focal motor/sensory deficits  LABORATORY DATA:  I have reviewed the data  as listed CBC Latest Ref Rng & Units 01/20/2017 01/06/2017 12/23/2016  WBC 3.9 - 10.3 10e3/uL 6.6 6.0 6.3  Hemoglobin 11.6 - 15.9 g/dL 12.0 11.2(L) 12.4  Hematocrit 34.8 - 46.6 % 37.2 34.4(L) 38.7  Platelets 145 - 400 10e3/uL 366 323 276     CMP Latest Ref Rng & Units 01/06/2017 12/23/2016 12/02/2016  Glucose 70 - 140 mg/dl 57(L) 55(L) 66(L)  BUN 7.0 - 26.0 mg/dL 15.4 18.4 23.4  Creatinine 0.6 - 1.1 mg/dL 1.0 1.2(H) 1.2(H)  Sodium 136 - 145 mEq/L 142 143 144  Potassium 3.5 - 5.1 mEq/L 3.6 3.7 3.5  Chloride 101 - 111 mmol/L - - -  CO2 22 - 29 mEq/L '25 23 23  '$ Calcium 8.4 - 10.4 mg/dL 9.1 9.2 9.6  Total Protein 6.4 - 8.3 g/dL 7.3 7.4 7.6  Total Bilirubin 0.20 - 1.20 mg/dL 0.49 0.50 0.49  Alkaline Phos 40 - 150 U/L 69 76 88  AST 5 - 34 U/L '16 19 18  '$ ALT 0 - 55 U/L '11 14 9     '$ RADIOGRAPHIC STUDIES: I have personally reviewed the radiological images as listed and agreed with the findings in the report. No results found.   ASSESSMENT & PLAN: 76 y.o.African-American female, with past medical history of moderate dementia, independent with ADLs, hypertension, presents with small bowel obstruction and weight loss.  1. 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 CKD stage III, right hydroureter  3. HTN, dementia  5. Nausea and diarrhea 8.Goal of care discussion  9. Insomnia  10. Proteinuria  Ms. [place] appears stable today, tolerating Irinotecan/Avastin every 2 weeks well overall. Her daughter continues to try to coordinate home health care, they will meet with G[place]a [place] in social work today during infusion appointment. Daughter has been interviewing private caregivers as well. Pt has persistent insomnia despite remeron 15 mg, melatonin, benadryl 25 mg at night. I recommend increasing benadryl to 37.5-50 mg PRN for sleep. Remeron dose can be increased, but will wait for neuro input when she  f/u with Dr. Delice Lesch next week. She will try white noise machine for non-pharm intervention. BP elevated, likely secondary to avastin and CKD, otherwise VS and weight stable, CBC and Cmet unremarkable; anemia is resolved. Mildly elevated creatinine today, 1.2, but stable overall in setting of CKD stage III. Moderate proteinuria, 100 but stable; OK to treat with avastin. CEA mildly elevated to 10.36 two weeks ago, will continue to monitor. Plan to reimage in 02/2017; anticipate change  of therapy if scan shows progression. I discussed this with the family, they are prepared. She will continue irinotecan/avastin q2 weeks for now, return in 2 weeks prior to next cycle.   PLAN: -increase benadryl to 1.5-2 tabs PRN; try white noise machine for sleep -SW today to discuss home health care process -labs reviewed, continue irinotecan/avastin q2 weeks; OK to treat today -return in 2 weeks for f/u prior to next cycle, will order CT at that time  All questions were answered. The patient knows to call the clinic with any problems, questions or concerns. No barriers to learning was detected.     Alla Feeling, NP 01/20/17

## 2017-01-20 NOTE — Telephone Encounter (Signed)
Gave avs and calendar for December  °

## 2017-01-24 ENCOUNTER — Encounter: Payer: Self-pay | Admitting: Neurology

## 2017-01-24 ENCOUNTER — Ambulatory Visit (INDEPENDENT_AMBULATORY_CARE_PROVIDER_SITE_OTHER): Payer: Medicare Other | Admitting: Neurology

## 2017-01-24 VITALS — BP 132/78 | HR 75 | Ht 64.0 in | Wt 116.0 lb

## 2017-01-24 DIAGNOSIS — F0390 Unspecified dementia without behavioral disturbance: Secondary | ICD-10-CM | POA: Diagnosis not present

## 2017-01-24 DIAGNOSIS — F03C0 Unspecified dementia, severe, without behavioral disturbance, psychotic disturbance, mood disturbance, and anxiety: Secondary | ICD-10-CM

## 2017-01-24 MED ORDER — QUETIAPINE FUMARATE 25 MG PO TABS
25.0000 mg | ORAL_TABLET | Freq: Every day | ORAL | 6 refills | Status: DC
Start: 2017-01-24 — End: 2017-08-16

## 2017-01-24 MED FILL — QUETIAPINE FUMARATE 25 MG T: 25 | 27 days supply | Qty: 30 | Fill #0

## 2017-01-24 NOTE — Progress Notes (Signed)
NEUROLOGY FOLLOW UP OFFICE NOTE  Beth Wilkins [id number]  HISTORY OF PRESENT ILLNESS: I had the pleasure of seeing Beth Wilkins in follow-up in the neurology clinic on 01/24/2017. 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 MMSE 9/30 in November 2017 (13/30 in June 2016, 11/30 in December 2015). She is again accompanied by her daughter who helps supplement the history today. Since her last visit, she has continued on chemotherapy for metastatic colon cancer. She has had continued decline in cognition, she is aphasic today, able to say phrases but unable to complete MMSE. When asked to draw a clock, she says "I have not done that in a long time, but I'll try." She draws a circle, then another circle inside. She cannot name objects or follow 2-step commands. She had difficulty with one-step commands. She denied any headache, dizziness, vision changes, focal numbness/tingling/weakness. I spoke to her daughter separately, she helps her mother bathe and put on clothes. She has difficulty getting her mother to eat. She had a caregiver come but now cannot afford it anymore. She has good and bad days, sometimes she is able to speak full sentences and eat better. Other times she seems resentful of her daughter, her daughter tries to get her to help with house chores, then something kicks in and she refuses to do it any more. She is having hallucinations, seeing children or talking to the trash can. She sometimes seems to think her daughter was a past girlfriend of her husband and gets upset with her. She gets easily agitated and combative, one time she urinated on the floor, and as her daughter tried to clean it and move her, she started swinging at her daughter. She has difficulty sleeping, she is taking Remeron 15mg  qhs, Benadryl, and melatonin. She is scheduled for a PET scan in December,  then discuss further cancer management after.   HPI: This is a 76 yo RH woman with a history of hypertension and hyperlipidemia with memory loss that her daughter started to notice in September 2013. Her daughter is a Catering manager and was away for 2 months in 2013, when she got home in September 2013, their house was "ridiculously hot" over 90 degrees. Her mother did not seem concerned about it and stated the [facility] was not working, however her daughter became concerned that this was not addressed by the patient. She had fallen in 01/2013 and sustained a pelvic fracture. Since then, her daughter started noticing short-term memory problems, asking repeated questions, with focusing difficulties, worse with stress or when people are arguing around her, making her agitated. The patient reports that she would think of something then get tangled, taking a while to get her thoughts back. Her daughter has noticed that she can spell words but sometimes would not recognize them (she could not recognize if it was December or January written down). She reports that with prescription glasses, she was able to put words together. Her daughter is concerned that she has to practice writing a check beforehand, however the patient reports that she has always done this. She has only missed paying one bill, her daughter reports she has been very good with this. She occasionally forgets to take her medications and uses a pillbox. Her family has not allowed her to drive anymore.  Diagnostic Data: MRI brain does not show any evidence of focal lesions such as mass or  infarct. There is moderate generalized atrophy that appears more significant over the left temporal and occipital lobes, however images are slightly tilted.   PAST MEDICAL HISTORY: Past Medical History:  Diagnosis Date  . Anxiety   . Arthritis   . Bilateral renal cysts   . Centrilobular emphysema ([place])   . Chronic low back pain   . CKD (chronic kidney disease),  stage III ([place])   . 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 Complication of anesthesia    can be combative due to dementia  . Full dentures   . History of gastroesophageal reflux (GERD)    changes diet  . History of pelvic fracture   . Hyperlipidemia   . Hypertension   . Liver metastasis ([place])   . 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 Pulmonary nodules   . Right ovarian cyst   . 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 UTI (urinary tract infection) 09/26/2016   5 day antibiotic treatment    MEDICATIONS: Current Outpatient Medications on File Prior to Visit  Medication Sig Dispense Refill  . amLODipine (NORVASC) 5 MG tablet Take 5 mg by mouth every morning.     . Cholecalciferol (VITAMIN D3) 2000 UNITS TABS Take by mouth daily.    . Coenzyme Q10 (CO Q 10) 100 MG CAPS Take by mouth daily.    . Lutein-Zeaxanthin 25-5 MG CAPS Take by mouth.    . Meth-Hyo-M Bl-Na Phos-Ph Sal (URIBEL) 118 MG CAPS Take 1 capsule (118 mg total) by mouth 2 (two) times daily as needed. 30 capsule 3  . Meth-Hyo-M Bl-Na Phos-Ph Sal (URO-MP) 118 MG CAPS     . mirabegron ER (MYRBETRIQ) 50 MG TB24 tablet Take 50 mg by mouth daily.    . mirtazapine (REMERON) 15 MG tablet TAKE 1 TABLET BY MOUTH AT BEDTIME 30 tablet 3  . Multiple Vitamins-Minerals (OCUVITE-LUTEIN PO) Take 1 tablet by mouth daily.    . Multiple Vitamins-Minerals (WOMENS MULTIVITAMIN PO) Take 1 tablet by mouth daily.    Marland Kitchen MYRBETRIQ 25 MG TB24  tablet   11  . polyethylene glycol (MIRALAX / GLYCOLAX) packet Take 17 g by mouth daily. (Patient taking differently: Take 17 g by mouth daily as needed. ) 14 each 0  . prochlorperazine (COMPAZINE) 5 MG tablet Take 1 tablet (5 mg total) by mouth every 6 (six) hours as needed for nausea or vomiting. (Patient not taking: Reported on 12/23/2016) 30 tablet 0  . traMADol (ULTRAM) 50 MG tablet Take 1 tablet (50 mg total) by mouth every 6 (six) hours as needed. 30 tablet 0  . UNABLE TO FIND Med Name: Closys Mouth Rinse two-three times daily for ulcer.    . vitamin B-12 (CYANOCOBALAMIN) 1000 MCG tablet Take 1,000 mcg by mouth daily.     Current Facility-Administered Medications on File Prior to Visit  Medication Dose Route Frequency Provider Last Rate Last Dose  . heparin lock flush 100 unit/mL  500 Units Intravenous Once [name],  MD        ALLERGIES: Allergies  Allergen Reactions  . Namenda [Memantine Hcl] Other (See Comments)    hallucinations  . Oxycodone Other (See Comments)    Hallucinations     FAMILY HISTORY: Family History  Problem Relation Age of Onset  . Diabetes Mellitus II Father   . Cancer Mother 51       ovarian cancer   . Cervical cancer Other     SOCIAL HISTORY: Social History   Socioeconomic History  . Marital status: Single    Spouse name: Not on file  . Number of children: Not on file  . Years of education: Not on file  . Highest education level: Not on file  Social Needs  . Financial resource strain: Not on file  . Food insecurity - worry: Not on file  . Food insecurity - inability: Not on file  . Transportation needs - medical: Not on file  . Transportation needs - non-medical: Not on file  Occupational History  . Not on file  Tobacco Use  . Smoking status: Former Smoker    Packs/day: 0.25    Years: 50.00    Pack years: 12.50    Types: Cigarettes    Last attempt to quit: 01/25/2015    Years since quitting: 2.0  . Smokeless tobacco: Never Used    Substance and Sexual Activity  . Alcohol use: Yes    Alcohol/week: 1.8 oz    Types: 2 Glasses of wine, 1 Cans of beer per week    Comment: occasional   . Drug use: No  . Sexual activity: Not on file  Other Topics Concern  . Not on file  Social History Narrative   Legally separated   Lives w/daughter, Arrow Emmerich   Has total of #2 daughters and #1 son   Dementia   Fall Risk-ambulates w/cane    REVIEW OF SYSTEMS: Constitutional: No fevers, chills, or sweats, no generalized fatigue, change in appetite Eyes: No visual changes, double vision, eye pain Ear, nose and throat: No hearing loss, ear pain, nasal congestion, sore throat Cardiovascular: No chest pain, palpitations Respiratory:  No shortness of breath at rest or with exertion, wheezes GastrointestinaI: No nausea, vomiting, diarrhea, abdominal pain, fecal incontinence Genitourinary:  No dysuria, urinary retention or frequency Musculoskeletal:  No neck pain, back pain Integumentary: No rash, pruritus, skin lesions Neurological: as above Psychiatric: No depression, insomnia, anxiety Endocrine: No palpitations, fatigue, diaphoresis, mood swings, change in appetite, change in weight, increased thirst Hematologic/Lymphatic:  No anemia, purpura, petechiae. Allergic/Immunologic: no itchy/runny eyes, nasal congestion, recent allergic reactions, rashes  PHYSICAL EXAM: Vitals:   01/24/17 1505  BP: 132/78  Pulse: 75  SpO2: 99%   General: No acute distress Head:  Normocephalic/atraumatic Neck: supple, no paraspinal tenderness, full range of motion Heart:  Regular rate and rhythm Lungs:  Clear to auscultation bilaterally Back: No paraspinal tenderness Skin/Extremities: No rash, no edema Neurological Exam: alert and oriented to person, city/state. She is making paraphasic errors, unable to name. When asked to spell WORLD, she says "mary." She gives her birthday when asked the date. Unable to repeat phrases.Cranial nerves:  Pupils equal, round, reactive to light. Extraocular movements intact with no nystagmus. Visual fields full. Facial sensation intact. No facial asymmetry. Tongue, uvula, palate midline. Motor: Bulk and tone normal, muscle strength 5/5 throughout with no pronator drift. Sensation to light touch. No extinction to double simultaneous stimulation. Deep tendon reflexes 2+ throughout, toes downgoing. Finger to nose testing intact,  but had difficulty following command. Gait narrow-based and steady. Romberg negative.  IMPRESSION: This is a 76 yo RH woman with a history of hypertension and hyperlipidemia, who initially presented with memory loss and visual deficits (left neglect) and alexia, with progressive cognitive decline. Unable to complete MMSE today, she is making paraphasic errors and has expressive and receptive aphasia. I had an extensive discussion with her daughter about the diagnosis, prognosis, and symptom management. We will add on low dose Seroquel for the behavioral changes, side effects, including black box cardiac warning, were discussed. We discussed transitioning her mother to [facility], she will be connected with [facility] through the [employer] to help navigate the process. After discussion today, her daughter seems to have made the decision to stop chemotherapy after the PET scan next month and consider Hospice care, which I support. Emotional support was also provided to her daughter today. She will discuss this further with the patient's oncologist. We will continue communicating through either MyChart or by phone, and she will follow-up as needed.   Thank you for allowing me to participate in her care.  Please do not hesitate to call for any questions or concerns.  The duration of this appointment visit was 25 minutes of face-to-face time with the patient.  Greater than 50% of this time was spent in counseling, explanation of diagnosis, planning of further management,  and coordination of care.   Ellouise Newer, M.D.   CC: Dr. Michail Sermon, Dr. Burr Medico

## 2017-01-24 NOTE — Patient Instructions (Signed)
1. Start Seroquel 25mg : Take 1/2 tablet at night for a week, then increase to 1 tablet at night. Depending on how she is doing, we may further increase to twice a day dosing. Please contact us to update on her response 2. Continue mirtazapine 15mg  at bedtime 3. We will get you connected with [facility] through the [employer] 4. Once in hospice, we can continue communicating through MyChart or phone, and can follow-up as needed  FALL PRECAUTIONS: Be cautious when walking. Scan the area for obstacles that may increase the risk of trips and falls. When getting up in the mornings, sit up at the edge of the bed for a few minutes before getting out of bed. Consider elevating the bed at the head end to avoid drop of blood pressure when getting up. Walk always in a well-lit room (use night lights in the walls). Avoid area rugs or power cords from appliances in the middle of the walkways. Use a walker or a cane if necessary and consider physical therapy for balance exercise. Get your eyesight checked regularly.  FINANCIAL OVERSIGHT: Supervision, especially oversight when making financial decisions or transactions is also recommended.  HOME SAFETY: Consider the safety of the kitchen when operating appliances like stoves, microwave oven, and blender. Consider having supervision and share cooking responsibilities until no longer able to participate in those. Accidents with firearms and other hazards in the house should be identified and addressed as well.  DRIVING: Regarding driving, in patients with progressive memory problems, driving will be impaired. We advise to have someone else do the driving if trouble finding directions or if minor accidents are reported. Independent driving assessment is available to determine safety of driving.  ABILITY TO BE LEFT ALONE: If patient is unable to contact 911 operator, consider using Life[facility], or when the need is there, arrange for someone to stay with  patients. Smoking is a fire hazard, consider supervision or cessation. Risk of wandering should be assessed by caregiver and if detected at any point, supervision and safe proof recommendations should be instituted.  MEDICATION SUPERVISION: Inability to self-administer medication needs to be constantly addressed. Implement a mechanism to ensure safe administration of the medications.  RECOMMENDATIONS FOR ALL PATIENTS WITH MEMORY PROBLEMS: 1. Continue to exercise (Recommend 30 minutes of walking everyday, or 3 hours every week) 2. Increase social interactions - continue going to [place] and enjoy social gatherings with friends and family 3. Eat healthy, avoid fried foods and eat more fruits and vegetables 4. Maintain adequate blood pressure, blood sugar, and blood cholesterol level. Reducing the risk of stroke and cardiovascular disease also helps promoting better memory. 5. Avoid stressful situations. Live a simple life and avoid aggravations. Organize your time and prepare for the next day in anticipation. 6. Sleep well, avoid any interruptions of sleep and avoid any distractions in the bedroom that may interfere with adequate sleep quality 7. Avoid sugar, avoid sweets as there is a strong link between excessive sugar intake, diabetes, and cognitive impairment We discussed the Mediterranean diet, which has been shown to help patients reduce the risk of progressive memory disorders and reduces cardiovascular risk. This includes eating fish, eat fruits and green leafy vegetables, nuts like almonds and hazelnuts, walnuts, and also use olive oil. Avoid fast foods and fried foods as much as possible. Avoid sweets and sugar as sugar use has been linked to worsening of memory function.  There is always a concern of gradual progression of memory problems. If this is the case,  then we may need to adjust level of care according to patient needs. Support, both to the patient and caregiver, should then be put into  place.

## 2017-02-01 MED FILL — AMLODIPINE BESYLATE 5 MG TA: 5 | 30 days supply | Qty: 30 | Fill #3

## 2017-02-02 NOTE — Progress Notes (Signed)
Beth Wilkins  [phone] [phone]) [phone]  Clinic Follow Up Note   Patient Care Team: Beth Heckle, MD as PCP - General (Internal Medicine) Beth Merle, MD as Consulting Physician (Medical Oncology) Beth Hausen, MD as Consulting Physician (General Surgery) Beth Cedars, MD as Consulting Physician (Orthopedic Surgery) Beth Ingles [name], MD as Consulting Physician (Urology) 02/03/2017     CHIEF COMPLAINTS:  Follow up metastatic colon cancer  Oncology History   Cancer of right colon [facility])   Staging form: Colon and Rectum, AJCC 7th Edition     Pathologic stage from 01/23/2015: Stage IIC (T4b, N0, cM0) - Signed by Beth Merle, MD on 02/05/2015       Cancer of right colon ([place])   01/21/2015 Imaging     CT abdomen and pelvis showed high-grade small bowel obstruction,  right I [place] ureteral nephrosis,  multiple liver cysts.  CT chest was negative for metastatic lesion.      01/23/2015 Pathology Results     invasive adenocarcinoma extending into the pericecal connective tissue,  grade 2, LVI (-),  no perineural invasion, 3 lymph nodes were negative.      01/23/2015 Surgery      terminal ileum and cecum seegmental resection with anastomosis by Dr. Hassell Done      01/23/2015 Initial Diagnosis    Cancer of right colon ([place])      03/13/2015 - 07/06/2015 Chemotherapy    Xeloda 1500 mg bid X 14 days on-7 off, s/p 5 cycles, stopped early due to wrosening renal function      10/28/2015 Progression    PET scan showed multiple hypermetabolic peritoneal nodules, right ovarian cystic mass, and hypermetabolic liver lesion, highly suspicious for metastatic colon cancer. Small pulmonary nodules are indeterminate.       11/11/2015 - 05/20/2016 Chemotherapy    Xeloda 1573m bid, 2 weeks on and one week off, Avastin was added on from cycle 3. Xeloda held from 05/20/16 due to progression on CT from 05/19/16 and total bilirubin increased to 2.6.      01/08/2016 Imaging      Ct chest, Abdomen Pelvis Wo Contrast 01/08/2016  IMPRESSION: 1. 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 2. Hepatic and renal cysts. 3.  Aortoiliac atherosclerotic vascular disease. 4. Lumbar spondylosis and degenerative disc disease with multilevel Impingement.      05/19/2016 Imaging    CT CAP w Contrast IMPRESSION: 1. Numerous small solid pulmonary nodules, one of which is new, and several of which have mildly increased in size since 10/28/2015, worrisome for mild progression of pulmonary metastases. 2. Solitary right liver lobe metastasis is mildly increased in size since 01/08/2016. 3. Peritoneal metastases have mildly increased in size since 01/08/2016, largest in the right pelvic sidewall. 4. Well-positioned right nephroureteral stent without overt right hydronephrosis. 5. Additional findings include aortic atherosclerosis, 1 vessel coronary atherosclerosis and mild to moderate emphysema.      06/03/2016 -  Chemotherapy    Second-Line Irinotecan and Avastin every 2 weeks starting 06/03/16      07/29/2016 Tumor Marker    CEA 6.51       08/09/2016 Imaging    PET  IMPRESSION: 1. Overall mild improvement compared to FDG PET scan of 10/28/2015. 2. Pulmonary nodules are increased in size from PET-CT scan and similar size to most recent CT scan. Largest lesion RIGHT lower lobe does have associated faint metabolic activity consistent with pulmonary metastasis. 3. Decrease in metabolic activity of RIGHT hepatic  lobe lesion. Potential second lesion LEFT hepatic lobe with slightly lower metabolic activity. 4. Decreased metabolic activity at the enteric colonic anastomosis in the RIGHT lower quadrant. 5. Interval decrease in metabolic activity of peritoneal nodular metastasis. 6. Nodular implant implant along ventral abdominal wall may be within the musculature, but also improved.      11/25/2016 PET scan     PET 11/25/16 IMPRESSION: 1. Interval worsening in the interval. Numerous pulmonary nodules are again seen. While multiple nodules are stable, there are several nodules which are a little larger in the interval. They remain too small to completely characterize with PET-CT. The 2 hepatic metastases demonstrate increased FDG uptake on today's study compared to the previous study. Abdominal wall and peritoneal metastases are mildly worsened.       HISTORY OF PRESENTING ILLNESS (02/05/2015):  Beth Wilkins 76 y.o. female  with past medical history of moderate dementia, hypertension, is here because of recently diagnosed stage II colon cancer. She is accompanied to the clinic by her daughter. History is mainly obtained from the chart and her daughter.  She has had abdominal discomfort, nausea and vomiting, and anorexia for  the past to 3 months along with 15 pounds weight loss, . Her nausea was mild and intermittent, she does not seek medical attention initially. She presented with  worsening symptoms for 2-3 weeks, and episodes of projectile vomiting and dehydration to emergency room, was admitted to [facility] on 01/20/2015. CT scan reviewed small bowel obstruction, and possible appendiceal rupture. She was brought to OR on 01/23/2015, underwent right hemicolectomy with anastomosis by Dr. Hassell Done. Surgical path reviewed adenocarcinoma at the cecum. She was discharged home on 01/26/2015.  She has recovered well, eating much better than prior to surgery, move around without restriction. No pain, she has loose BM 3 times a day, and she has backed off her miralax.  she lives with her daughter. She states her appetite is moderate, sometime low, and she refuses to eat if she doesn't have appetite. No nausea, vomiting, abdominal bloating since her surgery.  Her last colonoscopy was 6-8 years ago, and her stool OB was negative this year.   CURRENT THERAPY: Second-Line Irinotecan and Avastin every 2 weeks  started 06/03/16  INTERIM HISTORY:   Beth Wilkins is here for a follow up. She presents to the clinic today accompanied by her daughter.  She notes [name]'s birthday is this Sunday and will be celebrating with family. She went to see Dr. Michele Mcalpine last week. She was put on Seroquel, and her daughter notes she has seen improvement with mood, sleeping and memory. Her last treatment went well. Her daughter notes she had some constipation but resolved with stool softener. Her daughter thinks the patient has UTI due to complaining of dysuria for the past 2 days. Her daughter wants to increase her water intake. Overall Jonica is doing well.    MEDICAL HISTORY:  Past Medical History:  Diagnosis Date  . Anxiety   . Arthritis   . Bilateral renal cysts   . Centrilobular emphysema ([facility])   . Chronic low back pain   . CKD (chronic kidney disease), stage III ([place])   . Colon cancer [facility]) oncologist-  dr Beth Wilkins   dx 01-23-2015  right colon Invasive adenocarcinoma into pericecal connective tissue and involving small intestine , Stage IIc, Grade 2, LVI(-),  (pT4b N0 M0) /  s/p resection termial ileum and cecum and chemotherapy 01-06-217 to 07-06-2015/   08/ 2017  per PET  recurrent w/ liver mets and peritoneal carcinomatosis  . Complication of anesthesia    can be combative due to dementia  . Full dentures   . History of gastroesophageal reflux (GERD)    changes diet  . History of pelvic fracture   . Hyperlipidemia   . Hypertension   . Liver metastasis ([place])   . 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 Pulmonary nodules   . Right ovarian cyst   . Ureteral obstruction, right urologist-  dr Beth Ingles   malignant obstruction right ureter w/ colon cancer -- treated with ureteral stent placement  . UTI (urinary tract infection) 09/26/2016   5 day antibiotic treatment    SURGICAL HISTORY: Past Surgical History:  Procedure  Laterality Date  . CATARACT EXTRACTION W/ INTRAOCULAR LENS  IMPLANT, BILATERAL  2005 approx  . CYSTOSCOPY W/ URETERAL STENT PLACEMENT Right 12/03/2015   Procedure: CYSTOSCOPY WITH RETROGRADE PYELOGRAM/URETERAL STENT PLACEMENT;  Surgeon: Cleon Gustin, MD;  Location: [facility];  Service: Urology;  Laterality: Right;  . CYSTOSCOPY W/ URETERAL STENT PLACEMENT Right 02/15/2016   Procedure: CYSTOSCOPY WITH RETROGRADE PYELOGRAM/URETERAL STENT REPLACEMENT;  Surgeon: Cleon Gustin, MD;  Location: [facility];  Service: Urology;  Laterality: Right;  . CYSTOSCOPY W/ URETERAL STENT PLACEMENT Right 03/28/2016   Procedure: CYSTOSCOPY WITH RETROGRADE PYELOGRAM/URETERAL STENT EXCHANGE;  Surgeon: Cleon Gustin, MD;  Location: [facility];  Service: Urology;  Laterality: Right;  . CYSTOSCOPY W/ URETERAL STENT PLACEMENT Right 07/18/2016   Procedure: CYSTOSCOPY WITH RETROGRADE PYELOGRAM/ STENT EXCHANGE;  Surgeon: Cleon Gustin, MD;  Location: [facility];  Service: Urology;  Laterality: Right;  . CYSTOSCOPY W/ URETERAL STENT PLACEMENT Right 10/10/2016   Procedure: CYSTOSCOPY WITH RETROGRADE PYELOGRAM/URETERAL STENT REPLACEMENT;  Surgeon: Cleon Gustin, MD;  Location: [facility];  Service: Urology;  Laterality: Right;  . CYSTOSCOPY WITH RETROGRADE PYELOGRAM, URETEROSCOPY AND STENT PLACEMENT Right 09/21/2015   Procedure: CYSTOSCOPY WITH BIL RETROGRADE PYELOGRAM, URETEROSCOPY, RIGHT STENT PLACEMENT ;  Surgeon: Cleon Gustin, MD;  Location: [facility];  Service: Urology;  Laterality: Right;  . LAPAROSCOPY N/A 01/23/2015   Procedure: DIAGNOSTIC LAPAROSCOPY, EXPLORATORY LAPAROTOMY  RIGHT HEMI-COLECTOMY FOR OBSTRUCTION OF RIGHT TERMINAL ILEUM;  Surgeon: Beth Hausen, MD;  Location: [facility];  Service: General;  Laterality: N/A;    SOCIAL HISTORY: Social History   Socioeconomic History  . Marital  status: Single    Spouse name: Not on file  . Number of children: Not on file  . Years of education: Not on file  . Highest education level: Not on file  Social Needs  . Financial resource strain: Not on file  . Food insecurity - worry: Not on file  . Food insecurity - inability: Not on file  . Transportation needs - medical: Not on file  . Transportation needs - non-medical: Not on file  Occupational History  . Not on file  Tobacco Use  . Smoking status: Former Smoker    Packs/day: 0.25    Years: 50.00    Pack years: 12.50    Types: Cigarettes    Last attempt to quit: 01/25/2015    Years since quitting: 2.0  . Smokeless tobacco: Never Used  Substance and Sexual Activity  . Alcohol use: Yes    Alcohol/week: 1.8 oz    Types: 2 Glasses of wine, 1 Cans of beer per week  Comment: occasional   . Drug use: No  . Sexual activity: Not on file  Other Topics Concern  . Not on file  Social History Narrative   Legally separated   Lives w/daughter, Collier Monica   Has total of #2 daughters and #1 son   Dementia   Fall Risk-ambulates w/cane    FAMILY HISTORY: Family History  Problem Relation Age of Onset  . Diabetes Mellitus II Father   . Cancer Mother 51       ovarian cancer   . Cervical cancer Other     ALLERGIES:  is allergic to namenda [memantine hcl] and oxycodone.  MEDICATIONS:  Current Outpatient Medications  Medication Sig Dispense Refill  . amLODipine (NORVASC) 5 MG tablet Take 5 mg by mouth every morning.     . Cholecalciferol (VITAMIN D3) 2000 UNITS TABS Take by mouth daily.    . Coenzyme Q10 (CO Q 10) 100 MG CAPS Take by mouth daily.    . Lutein-Zeaxanthin 25-5 MG CAPS Take by mouth.    . Meth-Hyo-M Bl-Na Phos-Ph Sal (URIBEL) 118 MG CAPS Take 1 capsule (118 mg total) by mouth 2 (two) times daily as needed. 30 capsule 3  . Meth-Hyo-M Bl-Na Phos-Ph Sal (URO-MP) 118 MG CAPS     . mirabegron ER (MYRBETRIQ) 50 MG TB24 tablet Take 50 mg by mouth daily.    .  mirtazapine (REMERON) 15 MG tablet TAKE 1 TABLET BY MOUTH AT BEDTIME 30 tablet 3  . Multiple Vitamins-Minerals (OCUVITE-LUTEIN PO) Take 1 tablet by mouth daily.    . Multiple Vitamins-Minerals (WOMENS MULTIVITAMIN PO) Take 1 tablet by mouth daily.    Marland Kitchen MYRBETRIQ 25 MG TB24 tablet   11  . polyethylene glycol (MIRALAX / GLYCOLAX) packet Take 17 g by mouth daily. (Patient taking differently: Take 17 g by mouth daily as needed. ) 14 each 0  . prochlorperazine (COMPAZINE) 5 MG tablet Take 1 tablet (5 mg total) by mouth every 6 (six) hours as needed for nausea or vomiting. 30 tablet 0  . QUEtiapine (SEROQUEL) 25 MG tablet Take 1 tablet (25 mg total) by mouth at bedtime. Take 1/2 tablet at night for 1 week, then increase to 1 tablet at night 30 tablet 6  . traMADol (ULTRAM) 50 MG tablet Take 1 tablet (50 mg total) by mouth every 6 (six) hours as needed. 30 tablet 0  . UNABLE TO FIND Med Name: Closys Mouth Rinse two-three times daily for ulcer.    . vitamin B-12 (CYANOCOBALAMIN) 1000 MCG tablet Take 1,000 mcg by mouth daily.     No current facility-administered medications for this visit.    Facility-Administered Medications Ordered in Other Visits  Medication Dose Route Frequency Provider Last Rate Last Dose  . heparin lock flush 100 unit/mL  500 Units Intravenous Once Beth Merle, MD        REVIEW OF SYSTEMS:  Constitutional: Denies fevers, chills or abnormal night sweats (+) Improved sleep cycle Eyes: Denies blurriness of vision, double vision or watery eyes  Ears, nose, mouth, throat, and face: Denies mucositis or sore throat  Respiratory: Denies cough, dyspnea or wheezes Cardiovascular: Denies palpitation, chest discomfort or lower extremity swelling Gastrointestinal:  Denies heartburn  Urinary: (+) Dysuria  Skin: Denies abnormal skin rashes Lymphatics: Denies new lymphadenopathy or easy bruising Neurological:Denies numbness, tingling or new weaknesses Behavioral/Psych: Mood is stable, no new  changes (+) Dementia All other systems were reviewed with the patient and are negative.  PHYSICAL EXAMINATION:  ECOG PERFORMANCE STATUS:  1 - Symptomatic but completely ambulatory  Vitals:   02/03/17 1309  BP: (!) 146/81  Pulse: 85  Resp: 18  Temp: 98.2 F (36.8 C)  SpO2: 100%   Filed Weights   02/03/17 1309  Weight: 117 lb 1.6 oz (53.1 kg)     GENERAL:alert, no distress and comfortable SKIN: skin color, texture, turgor are normal, no rashes or significant lesions EYES: normal, conjunctiva are pink and non-injected, sclera clear (+) difficult to evaluate due to her dementia OROPHARYNX:no exudate (+) Upper and lower dentures  (+) slight color change of tongue  NECK: supple, thyroid normal size, non-tender, without nodularity LYMPH:  no palpable lymphadenopathy in the cervical, axillary or inguinal LUNGS: clear to auscultation and percussion with normal breathing effort HEART: regular rate & rhythm and no murmurs and no lower extremity edema ABDOMEN:abdomen soft, non-tender and normal bowel sounds  Musculoskeletal:no cyanosis of digits and no clubbing  PSYCH: alert and with fluent speech, (+) dementia  NEURO: no focal motor/sensory deficits  LABORATORY DATA:  I have reviewed the data as listed CBC Latest Ref Rng & Units 02/03/2017 01/20/2017 01/06/2017  WBC 3.9 - 10.3 10e3/uL 5.4 6.6 6.0  Hemoglobin 11.6 - 15.9 g/dL 11.8 12.0 11.2(L)  Hematocrit 34.8 - 46.6 % 36.4 37.2 34.4(L)  Platelets 145 - 400 10e3/uL 305 366 323    CMP Latest Ref Rng & Units 02/03/2017 01/20/2017 01/06/2017  Glucose 70 - 140 mg/dl 84 86 57(L)  BUN 7.0 - 26.0 mg/dL 14.5 15.1 15.4  Creatinine 0.6 - 1.1 mg/dL 1.2(H) 1.2(H) 1.0  Sodium 136 - 145 mEq/L 138 139 142  Potassium 3.5 - 5.1 mEq/L 3.7 3.6 3.6  Chloride 101 - 111 mmol/L - - -  CO2 22 - 29 mEq/L 21(L) 21(L) 25  Calcium 8.4 - 10.4 mg/dL 9.1 9.1 9.1  Total Protein 6.4 - 8.3 g/dL 7.4 7.4 7.3  Total Bilirubin 0.20 - 1.20 mg/dL 0.53 0.46 0.49    Alkaline Phos 40 - 150 U/L 71 71 69  AST 5 - 34 U/L _0 ALT 0 - 55 U/L _1 CEA  01/23/2015: 5.7 06/05/2015: 6.1 08/04/2015: 10.3 10/12/2015: 13.29 (in-house)  11/04/2015: 12.83 01/13/2016: 8.57 02/24/2016: 8.41 04/27/2016: 9.22 05/20/2016: 8.32 06/17/2016: 6.45 07/15/2016: 5.07 07/29/2016: 6.51 09/09/16: 7.47 10/07/16: 7.85 11/04/16: 7.75 12/02/16: 8.13 01/06/17: 10.36 02/03/17: PENDING    PATHOLOGY REPORT: Diagnosis 01/23/2015 Colon, segmental resection, with terminal ileum and cecum - INVASIVE ADENOCARCINOMA EXTENDING INTO PERICECAL CONNECTIVE TISSUE AND INVOLVING SMALL INTESTINE. - THREE BENIGN LYMPH NODES (0/3). - SEE ONCOLOGY TABLE BELOW.  Microscopic Comment COLON AND RECTUM (INCLUDING TRANS-ANAL RESECTION): Specimen: Terminal ileum and cecum. Procedure: Resection. Tumor site: Cecum adjacent to appendix. Specimen integrity: Intact. Macroscopic intactness of mesorectum: Not applicable. Macroscopic tumor perforation: No. Invasive tumor: Maximum size: 4.0 cm. Histologic type(s): Colorectal adenocarcinoma. Histologic grade and differentiation: G2: moderately differentiated/low grade. Type of polyp in which invasive carcinoma arose: No residual polyp. Microscopic extension of invasive tumor: Into pericecal connective tissue and terminal ileum. Lymph-Vascular invasion: Not identified. Peri-neural invasion: Not identified. Tumor deposit(s) (discontinuous extramural extension): No. Resection margins: Proximal margin: Free of tumor. Distal margin: Free of tumor. Circumferential (radial) (posterior ascending, posterior descending; lateral and posterior mid-rectum; and entire lower 1/3 rectum): Free of tumor. Mesenteric margin (sigmoid and transverse): N/A. Distance closest margin (if all above margins negative): N/A. Treatment effect (neo-adjuvant therapy): No. Additional polyp(s): No. Non-neoplastic findings: N/A. Lymph nodes: number examined 3; number  positive: 0. Pathologic Staging:  pT4b, pN0, pMX. Ancillary studies: Mismatch repair protein by immunohistochemistry pending. Comments: There is a colorectal-type adenocarcinoma arising in the cecum in the area of the appendiceal orifice. The tumor extends into the pericecal connective tissue, and involves the attached segment of terminal ileum. Immunohistochemistry shows the tumor is positive with CDX2 and cytokeratin 20, and negative with cytokeratin 7, estrogen receptor and WT-1 supporting a diagnosis of primary colorectal adenocarcinoma. ADDITIONAL INFORMATION: Mismatch Repair (MMR) Protein Immunohistochemistry (IHC) IHC Expression Result: MLH1: Preserved nuclear expression (greater 50% tumor expression) MSH2: Preserved nuclear expression (greater 50% tumor expression) MSH6: Preserved nuclear expression (greater 50% tumor expression) PMS2: Preserved nuclear expression (greater 50% tumor expression) * Internal control demonstrates intact nuclear expression Interpretation: NORMAL There is preserved expression of the major and minor MMR proteins. There is a very low probability that microsatellite instability (MSI) is present. However, certain clinically significant MMR protein mutations may result in preservation of nuclear expression. It is recommended that the preservation of protein expression be correlated with molecular based MSI testing.    RADIOGRAPHIC STUDIES: I have personally reviewed the radiological images as listed and agreed with the findings in the report.  PET 11/25/16 IMPRESSION: 1. Interval worsening in the interval. Numerous pulmonary nodules are again seen. While multiple nodules are stable, there are several nodules which are a little larger in the interval. They remain too small to completely characterize with PET-CT. The 2 hepatic metastases demonstrate increased FDG uptake on today's study compared to the previous study. Abdominal wall and  peritoneal metastases are mildly worsened.  PET 08/09/2016 IMPRESSION: 1. Overall mild improvement compared to FDG PET scan of 10/28/2015. 2. Pulmonary nodules are increased in size from PET-CT scan and similar size to most recent CT scan. Largest lesion RIGHT lower lobe does have associated faint metabolic activity consistent with pulmonary metastasis. 3. Decrease in metabolic activity of RIGHT hepatic lobe lesion. Potential second lesion LEFT hepatic lobe with slightly lower metabolic activity. 4. Decreased metabolic activity at the enteric colonic anastomosis in the RIGHT lower quadrant. 5. Interval decrease in metabolic activity of peritoneal nodular metastasis. 6. Nodular implant along ventral abdominal wall may be within the musculature, but also improved.  CT CAP w Contrast 05/19/2016 IMPRESSION: 1. Numerous small solid pulmonary nodules, one of which is new, and several of which have mildly increased in size since 10/28/2015, worrisome for mild progression of pulmonary metastases. 2. Solitary right liver lobe metastasis is mildly increased in size since 01/08/2016. 3. Peritoneal metastases have mildly increased in size since 01/08/2016, largest in the right pelvic sidewall. 4. Well-positioned right nephroureteral stent without overt right hydronephrosis. 5. Additional findings include aortic atherosclerosis, 1 vessel coronary atherosclerosis and mild to moderate emphysema.  Ct chest, Abdomen Pelvis Wo Contrast 01/08/2016  IMPRESSION: 1. 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 2. Hepatic and renal cysts. 3.  Aortoiliac atherosclerotic vascular disease. 4. Lumbar spondylosis and degenerative disc disease with multilevel impingement.  ASSESSMENT & PLAN: 76 y.o. African-American female, with past medical history of moderate dementia, independent with ADLs, hypertension, presents with small bowel obstruction  and weight loss.  1. 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 -I personally reviewed her recent PET scan images,  unfortunately the scan showed hypermetabolic peritoneal and liver metastases. 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 The tumor has MSI stable, she is not a candidate for immunotherapy, although she may be a candidate for clinical trial of immunotherapy plus MEK inhibitor at [facility]. -She started Xeloda 1570m bid, 2 weeks on and one week off on 11/11/15 and stopped 05/20/16 due to disease progression.  -I previously reviewed her restaging CT scan from 01/08/2016, which showed overall stable disease, no new lesions. We'll continue her chemotherapy. -I previously reviewed the CT from 05/19/2016 with the patient and her daughter in detail. She has had a moderate disease progression in lungs, liver and peritoneum. I recommended her to change treatment. -She started second line single agent irinotecan on 06/03/16 at a reduced dose 1519mm2, tolerated well. -The goal of therapy is palliative, mainly to improve her quality of life. - PET Scan image from 08/09/16 showed good partial response to treatment. We will continue irinotecan and Avastin. -We previously discussed pt being able to take chemo break and she will notify usKoreaf needed  -I will hold Avastin if her urine  protein 300 or above  -We discussed her 11/25/16 PET scan. She has small increase in size of some of her small lung nodules, and mild increased FDG of her liver mets, but overall stable disease. I think the benefit of current treatment is getting smaller. We may need to change treatment in 2 months after a repeat scan. Will continue Irinotecan and avastin for now. She agrees.  -We will consider changing her treatment to 5-fu weekly in the future if she has disease progresses -I have low threshold to stop her chemotherapy due to her comorbidities, especially dementia, if she develops more symptoms or intolerance due to chemotherapy treatment. -The daughter and I previously discussed trying to get advanced home care to help pt and what type of help she is seeking. I also mentioned the options of physical therapy to help with her fatigue.  -I discussed she is tolerating treatment well and can continue for as long as she can tolerate. Her daughter and family agree. -Labs reviewed, Stable Cr at 1.2, no anemia, otherwise adequate to proceed with Irinotecan and avastin today  -Her CEA has been mildly increasing. Today' CEA is pending. Will continue treatment regimen.  -Daughter met with home care but found she would not receive the help she and her mother needed. She will wait on searching for further help.  -Repeat scan in 03/2017  for restaging  -F/u in 6 weeks with me.    2. CKD stage III, right hydroureter  -Her Cr was around 1 in 08/2013, Cr increased to 2.4 on 01/20/2015 when she was diagnosed with colon cancer due to dehydration, improved some and moved around.3-1.5 .  -A previous CT scan showed right hydroureter, which probably contributes to her renal insufficiency. -she had ureter stent placement again in 12/2015, 03/2016, and 04/2016 -Patient had a stent replacement on 07/19/2015 -Previously underwent renal stent exchange.  -Cr is overall stable   3. HTN, dementia  -She will continue her  medication and follow-up with her primary care physician. -Her BP has been better controlled lately,  we discussed that Avastin can cause hypertension, we'll continue monitoring closely. -Her dementia has been getting worse gradually, which has increased her care burden on her daughter. I previously discussed burn-out prevention with her daughter, she appreciated and has planned to have a care-giver for her mother  -Dementia better on Seroquel    4. Nausea and diarrhea -Secondary to chemotherapy, overall very mild and manageable  -She is to take Imodium PRN and her nausea medications. -Nausea has improved and diarrhea has resolved.   5. Goal of care discussion  -We previously discussed the incurable nature of her cancer, and the overall poor prognosis, especially if she has progress on chemo -The patient understands the goal of care is palliative. -Due to her advanced age and comorbidities, especially dementia, I have low threshold to switch her to palliative care alone if she does not tolerate chemo well or dementia gets worse, her daughter is on -board  -I recommend DNR/DNI, pt and her daughter agrees.  -We reviewed what help she could get in home aid service or hospice. They plan to continue with chemotherapy as long as she can tolerate it. I will contact home care service and see if her insurance will cover it.  -Based on consult with socal worker, she will wait on searching for further help  6. Insomnia  -I first suggest melatonin over-the counter and to check with her neurologist.  -If that does not help she can use prescription medication.  -Suggested she increase benadryl to 1.5-2 tabs as needed. She will try white noise machine for non-pharm intervention. -Much better sleeping on Seroquel    7. Social support -Due to her dementia and chemotherapy, she requires 24/7 care at home.  Her daughter is her only caregiver. -I will refer her to advanced home care for nursing, physical  therapy, occupational therapy and home aids for bathing etc, referral was made today  -Social worker referral to discuss additional social services for pt   8. UTI sympoms  -Will due a Urine culture today with help of daughter.  -her urine was not tested today, I called in cipro 540m bid for 3 days   Plan -Labs reviewed, adequate for treatment, will proceed chemo today and continue every 2 weeks -I called in Cipro 500 mg twice daily for 3 days -She will see NP Lacie in 4 weeks -F/u in 6 weeks, PET IN 5-6 WEEKS   All questions were answered. The patient knows to call the clinic with any problems, questions or concerns.  I spent 20 minutes counseling the patient face to face. The total time spent in the appointment was 25 minutes and more than 50% was on counseling.  This document serves as a record of services personally performed by YTruitt Merle MD. It was created on her behalf by AJoslyn Devon a trained medical scribe. The creation of this record is based on the scribe's personal observations and the provider's statements to them.    I have reviewed the above documentation for accuracy and completeness, and I agree with the above.  FTruitt Wilkins 02/03/2017

## 2017-02-03 ENCOUNTER — Ambulatory Visit (HOSPITAL_BASED_OUTPATIENT_CLINIC_OR_DEPARTMENT_OTHER): Payer: Medicare Other | Admitting: Hematology

## 2017-02-03 ENCOUNTER — Ambulatory Visit (HOSPITAL_BASED_OUTPATIENT_CLINIC_OR_DEPARTMENT_OTHER): Payer: Medicare Other

## 2017-02-03 ENCOUNTER — Other Ambulatory Visit (HOSPITAL_BASED_OUTPATIENT_CLINIC_OR_DEPARTMENT_OTHER): Payer: Medicare Other

## 2017-02-03 VITALS — BP 146/81 | HR 85 | Temp 98.2°F | Resp 18 | Ht 64.0 in | Wt 117.1 lb

## 2017-02-03 DIAGNOSIS — N183 Chronic kidney disease, stage 3 (moderate): Secondary | ICD-10-CM | POA: Diagnosis not present

## 2017-02-03 DIAGNOSIS — C180 Malignant neoplasm of cecum: Secondary | ICD-10-CM | POA: Diagnosis not present

## 2017-02-03 DIAGNOSIS — G4700 Insomnia, unspecified: Secondary | ICD-10-CM

## 2017-02-03 DIAGNOSIS — C182 Malignant neoplasm of ascending colon: Secondary | ICD-10-CM

## 2017-02-03 DIAGNOSIS — Z79899 Other long term (current) drug therapy: Secondary | ICD-10-CM

## 2017-02-03 DIAGNOSIS — C786 Secondary malignant neoplasm of retroperitoneum and peritoneum: Secondary | ICD-10-CM | POA: Diagnosis not present

## 2017-02-03 DIAGNOSIS — Z5111 Encounter for antineoplastic chemotherapy: Secondary | ICD-10-CM

## 2017-02-03 DIAGNOSIS — I10 Essential (primary) hypertension: Secondary | ICD-10-CM | POA: Diagnosis not present

## 2017-02-03 DIAGNOSIS — Z5112 Encounter for antineoplastic immunotherapy: Secondary | ICD-10-CM | POA: Diagnosis not present

## 2017-02-03 DIAGNOSIS — R110 Nausea: Secondary | ICD-10-CM

## 2017-02-03 LAB — CBC WITH DIFFERENTIAL/PLATELET
BASO%: 0.9 % (ref 0.0–2.0)
BASOS ABS: 0.1 10*3/uL (ref 0.0–0.1)
EOS ABS: 0.3 10*3/uL (ref 0.0–0.5)
EOS%: 4.7 % (ref 0.0–7.0)
HEMATOCRIT: 36.4 % (ref 34.8–46.6)
HGB: 11.8 g/dL (ref 11.6–15.9)
LYMPH%: 24.7 % (ref 14.0–49.7)
MCH: 28.9 pg (ref 25.1–34.0)
MCHC: 32.4 g/dL (ref 31.5–36.0)
MCV: 89 fL (ref 79.5–101.0)
MONO#: 0.5 10*3/uL (ref 0.1–0.9)
MONO%: 9.2 % (ref 0.0–14.0)
NEUT#: 3.2 10*3/uL (ref 1.5–6.5)
NEUT%: 60.5 % (ref 38.4–76.8)
PLATELETS: 305 10*3/uL (ref 145–400)
RBC: 4.09 10*6/uL (ref 3.70–5.45)
RDW: 18.2 % — AB (ref 11.2–14.5)
WBC: 5.4 10*3/uL (ref 3.9–10.3)
lymph#: 1.3 10*3/uL (ref 0.9–3.3)
nRBC: 0 % (ref 0–0)

## 2017-02-03 LAB — COMPREHENSIVE METABOLIC PANEL
ALT: 10 U/L (ref 0–55)
ANION GAP: 12 meq/L — AB (ref 3–11)
AST: 18 U/L (ref 5–34)
Albumin: 3.4 g/dL — ABNORMAL LOW (ref 3.5–5.0)
Alkaline Phosphatase: 71 U/L (ref 40–150)
BUN: 14.5 mg/dL (ref 7.0–26.0)
CHLORIDE: 105 meq/L (ref 98–109)
CO2: 21 meq/L — AB (ref 22–29)
CREATININE: 1.2 mg/dL — AB (ref 0.6–1.1)
Calcium: 9.1 mg/dL (ref 8.4–10.4)
EGFR: 50 mL/min/{1.73_m2} — ABNORMAL LOW (ref >60)
Glucose: 84 mg/dl (ref 70–140)
POTASSIUM: 3.7 meq/L (ref 3.5–5.1)
Sodium: 138 mEq/L (ref 136–145)
Total Bilirubin: 0.53 mg/dL (ref 0.20–1.20)
Total Protein: 7.4 g/dL (ref 6.4–8.3)

## 2017-02-03 LAB — CEA (IN HOUSE-CHCC): CEA (CHCC-In House): 12.6 ng/mL — ABNORMAL HIGH (ref 0.00–5.00)

## 2017-02-03 LAB — UA PROTEIN, DIPSTICK - CHCC: PROTEIN: 30 mg/dL

## 2017-02-03 MED ORDER — IRINOTECAN HCL CHEMO INJECTION 100 MG/5ML
150.0000 mg/m2 | Freq: Once | INTRAVENOUS | Status: AC
  Administered 2017-02-03: 240 mg via INTRAVENOUS
  Filled 2017-02-03: qty 12

## 2017-02-03 MED ORDER — ATROPINE SULFATE 1 MG/ML IJ SOLN
INTRAMUSCULAR | Status: AC
  Filled 2017-02-03: qty 1

## 2017-02-03 MED ORDER — ATROPINE SULFATE 1 MG/ML IJ SOLN
0.5000 mg | Freq: Once | INTRAMUSCULAR | Status: AC | PRN
  Administered 2017-02-03: 0.5 mg via INTRAVENOUS

## 2017-02-03 MED ORDER — PALONOSETRON HCL INJECTION 0.25 MG/5ML
INTRAVENOUS | Status: AC
  Filled 2017-02-03: qty 5

## 2017-02-03 MED ORDER — SODIUM CHLORIDE 0.9 % IV SOLN
Freq: Once | INTRAVENOUS | Status: AC
  Administered 2017-02-03: 15:00:00 via INTRAVENOUS

## 2017-02-03 MED ORDER — SODIUM CHLORIDE 0.9 % IV SOLN
5.0000 mg/kg | Freq: Once | INTRAVENOUS | Status: AC
  Administered 2017-02-03: 275 mg via INTRAVENOUS
  Filled 2017-02-03: qty 11

## 2017-02-03 MED ORDER — PALONOSETRON HCL INJECTION 0.25 MG/5ML
0.2500 mg | Freq: Once | INTRAVENOUS | Status: AC
  Administered 2017-02-03: 0.25 mg via INTRAVENOUS

## 2017-02-03 MED ORDER — DEXAMETHASONE SODIUM PHOSPHATE 10 MG/ML IJ SOLN
10.0000 mg | Freq: Once | INTRAMUSCULAR | Status: AC
  Administered 2017-02-03: 10 mg via INTRAVENOUS

## 2017-02-03 MED ORDER — DEXAMETHASONE SODIUM PHOSPHATE 10 MG/ML IJ SOLN
INTRAMUSCULAR | Status: AC
Start: 2017-02-03 — End: 2017-02-03
  Filled 2017-02-03: qty 1

## 2017-02-03 NOTE — Patient Instructions (Signed)
[facility] Discharge Instructions for Patients Receiving Chemotherapy  Today you received the following chemotherapy agents Avastin and Irinotecan.   To help prevent nausea and vomiting after your treatment, we encourage you to take your nausea medication as directed.   If you develop nausea and vomiting that is not controlled by your nausea medication, call the clinic.   BELOW ARE SYMPTOMS THAT SHOULD BE REPORTED IMMEDIATELY:  *FEVER GREATER THAN 100.5 F  *CHILLS WITH OR WITHOUT FEVER  NAUSEA AND VOMITING THAT IS NOT CONTROLLED WITH YOUR NAUSEA MEDICATION  *UNUSUAL SHORTNESS OF BREATH  *UNUSUAL BRUISING OR BLEEDING  TENDERNESS IN MOUTH AND THROAT WITH OR WITHOUT PRESENCE OF ULCERS  *URINARY PROBLEMS  *BOWEL PROBLEMS  UNUSUAL RASH Items with * indicate a potential emergency and should be followed up as soon as possible.  Feel free to call the clinic should you have any questions or concerns. The clinic phone number is [phone].  Please show the CHEMO ALERT CARD at check-in to the Emergency Department and triage nurse.   

## 2017-02-05 ENCOUNTER — Encounter: Payer: Self-pay | Admitting: Hematology

## 2017-02-07 ENCOUNTER — Telehealth: Payer: Self-pay | Admitting: Hematology

## 2017-02-07 MED FILL — CEPHALEXIN 500 MG CAPSULE: 500 | 5 days supply | Qty: 10 | Fill #0

## 2017-02-07 NOTE — Telephone Encounter (Signed)
Appt scheduled and cancelled per 12/2 sch message - left message with appt time and date.

## 2017-02-09 MED FILL — AMOX TR-K CLV 875-125 MG TA: 875-125 | 5 days supply | Qty: 10 | Fill #0

## 2017-02-16 MED FILL — MIRTAZAPINE 15 MG TAB: 15 | 30 days supply | Qty: 30 | Fill #1

## 2017-02-16 MED FILL — MYRBETRIQ ER 25 MG TABLET: 25 | 30 days supply | Qty: 30 | Fill #4

## 2017-02-16 MED FILL — QUETIAPINE 25 MG TABLET: 25 | 27 days supply | Qty: 30 | Fill #1

## 2017-02-17 ENCOUNTER — Other Ambulatory Visit (HOSPITAL_BASED_OUTPATIENT_CLINIC_OR_DEPARTMENT_OTHER): Payer: Medicare Other

## 2017-02-17 ENCOUNTER — Ambulatory Visit (HOSPITAL_BASED_OUTPATIENT_CLINIC_OR_DEPARTMENT_OTHER): Payer: Medicare Other

## 2017-02-17 ENCOUNTER — Other Ambulatory Visit: Payer: Medicare Other

## 2017-02-17 ENCOUNTER — Ambulatory Visit: Payer: Medicare Other | Admitting: Nurse Practitioner

## 2017-02-17 VITALS — BP 130/65 | HR 48 | Temp 98.1°F | Resp 17 | Wt 117.5 lb

## 2017-02-17 DIAGNOSIS — C786 Secondary malignant neoplasm of retroperitoneum and peritoneum: Secondary | ICD-10-CM | POA: Diagnosis not present

## 2017-02-17 DIAGNOSIS — Z5112 Encounter for antineoplastic immunotherapy: Secondary | ICD-10-CM

## 2017-02-17 DIAGNOSIS — Z5111 Encounter for antineoplastic chemotherapy: Secondary | ICD-10-CM

## 2017-02-17 DIAGNOSIS — Z79899 Other long term (current) drug therapy: Secondary | ICD-10-CM | POA: Diagnosis not present

## 2017-02-17 DIAGNOSIS — C182 Malignant neoplasm of ascending colon: Secondary | ICD-10-CM

## 2017-02-17 DIAGNOSIS — C180 Malignant neoplasm of cecum: Secondary | ICD-10-CM | POA: Diagnosis not present

## 2017-02-17 LAB — COMPREHENSIVE METABOLIC PANEL
ALBUMIN: 3.5 g/dL (ref 3.5–5.0)
ALK PHOS: 78 U/L (ref 40–150)
ALT: 11 U/L (ref 0–55)
AST: 18 U/L (ref 5–34)
Anion Gap: 13 mEq/L — ABNORMAL HIGH (ref 3–11)
BILIRUBIN TOTAL: 0.32 mg/dL (ref 0.20–1.20)
BUN: 20.3 mg/dL (ref 7.0–26.0)
CALCIUM: 9.2 mg/dL (ref 8.4–10.4)
CO2: 21 mEq/L — ABNORMAL LOW (ref 22–29)
CREATININE: 1.2 mg/dL — AB (ref 0.6–1.1)
Chloride: 109 mEq/L (ref 98–109)
EGFR: 52 mL/min/{1.73_m2} — ABNORMAL LOW (ref >60)
GLUCOSE: 60 mg/dL — AB (ref 70–140)
POTASSIUM: 3.3 meq/L — AB (ref 3.5–5.1)
SODIUM: 144 meq/L (ref 136–145)
TOTAL PROTEIN: 7.2 g/dL (ref 6.4–8.3)

## 2017-02-17 LAB — CBC WITH DIFFERENTIAL/PLATELET
BASO%: 0.9 % (ref 0.0–2.0)
BASOS ABS: 0.1 10*3/uL (ref 0.0–0.1)
EOS%: 2.8 % (ref 0.0–7.0)
Eosinophils Absolute: 0.2 10*3/uL (ref 0.0–0.5)
HEMATOCRIT: 36.4 % (ref 34.8–46.6)
HEMOGLOBIN: 11.8 g/dL (ref 11.6–15.9)
LYMPH#: 1.6 10*3/uL (ref 0.9–3.3)
LYMPH%: 20.2 % (ref 14.0–49.7)
MCH: 29.3 pg (ref 25.1–34.0)
MCHC: 32.4 g/dL (ref 31.5–36.0)
MCV: 90.3 fL (ref 79.5–101.0)
MONO#: 0.7 10*3/uL (ref 0.1–0.9)
MONO%: 8.2 % (ref 0.0–14.0)
NEUT#: 5.4 10*3/uL (ref 1.5–6.5)
NEUT%: 67.9 % (ref 38.4–76.8)
NRBC: 0 % (ref 0–0)
Platelets: 313 10*3/uL (ref 145–400)
RBC: 4.03 10*6/uL (ref 3.70–5.45)
RDW: 18.8 % — AB (ref 11.2–14.5)
WBC: 7.9 10*3/uL (ref 3.9–10.3)

## 2017-02-17 LAB — UA PROTEIN, DIPSTICK - CHCC: PROTEIN: 100 mg/dL

## 2017-02-17 MED ORDER — DEXTROSE 5 % IV SOLN
150.0000 mg/m2 | Freq: Once | INTRAVENOUS | Status: AC
  Administered 2017-02-17: 240 mg via INTRAVENOUS
  Filled 2017-02-17: qty 12

## 2017-02-17 MED ORDER — ATROPINE SULFATE 1 MG/ML IJ SOLN
INTRAMUSCULAR | Status: AC
  Filled 2017-02-17: qty 1

## 2017-02-17 MED ORDER — DEXAMETHASONE SODIUM PHOSPHATE 10 MG/ML IJ SOLN
10.0000 mg | Freq: Once | INTRAMUSCULAR | Status: AC
  Administered 2017-02-17: 10 mg via INTRAVENOUS

## 2017-02-17 MED ORDER — ATROPINE SULFATE 1 MG/ML IJ SOLN
0.5000 mg | Freq: Once | INTRAMUSCULAR | Status: AC | PRN
  Administered 2017-02-17: 0.5 mg via INTRAVENOUS

## 2017-02-17 MED ORDER — SODIUM CHLORIDE 0.9 % IV SOLN
5.0000 mg/kg | Freq: Once | INTRAVENOUS | Status: AC
  Administered 2017-02-17: 275 mg via INTRAVENOUS
  Filled 2017-02-17: qty 11

## 2017-02-17 MED ORDER — PALONOSETRON HCL INJECTION 0.25 MG/5ML
0.2500 mg | Freq: Once | INTRAVENOUS | Status: AC
  Administered 2017-02-17: 0.25 mg via INTRAVENOUS

## 2017-02-17 MED ORDER — PALONOSETRON HCL INJECTION 0.25 MG/5ML
INTRAVENOUS | Status: AC
  Filled 2017-02-17: qty 5

## 2017-02-17 MED ORDER — SODIUM CHLORIDE 0.9 % IV SOLN
Freq: Once | INTRAVENOUS | Status: AC
  Administered 2017-02-17: 10:00:00 via INTRAVENOUS

## 2017-02-17 MED ORDER — DEXAMETHASONE SODIUM PHOSPHATE 10 MG/ML IJ SOLN
INTRAMUSCULAR | Status: AC
  Filled 2017-02-17: qty 1

## 2017-02-17 NOTE — Patient Instructions (Signed)
[facility] Discharge Instructions for Patients Receiving Chemotherapy  Today you received the following chemotherapy agents :  Avastin,  Irinotecan.  To help prevent nausea and vomiting after your treatment, we encourage you to take your nausea medication as prescribed.   If you develop nausea and vomiting that is not controlled by your nausea medication, call the clinic.   BELOW ARE SYMPTOMS THAT SHOULD BE REPORTED IMMEDIATELY:  *FEVER GREATER THAN 100.5 F  *CHILLS WITH OR WITHOUT FEVER  NAUSEA AND VOMITING THAT IS NOT CONTROLLED WITH YOUR NAUSEA MEDICATION  *UNUSUAL SHORTNESS OF BREATH  *UNUSUAL BRUISING OR BLEEDING  TENDERNESS IN MOUTH AND THROAT WITH OR WITHOUT PRESENCE OF ULCERS  *URINARY PROBLEMS  *BOWEL PROBLEMS  UNUSUAL RASH Items with * indicate a potential emergency and should be followed up as soon as possible.  Feel free to call the clinic should you have any questions or concerns. The clinic phone number is [phone].  Please show the CHEMO ALERT CARD at check-in to the Emergency Department and triage nurse.   

## 2017-02-20 ENCOUNTER — Encounter: Payer: Self-pay | Admitting: *Deleted

## 2017-03-03 ENCOUNTER — Telehealth: Payer: Self-pay | Admitting: Oncology

## 2017-03-03 ENCOUNTER — Other Ambulatory Visit: Payer: Medicare Other

## 2017-03-03 ENCOUNTER — Ambulatory Visit (HOSPITAL_BASED_OUTPATIENT_CLINIC_OR_DEPARTMENT_OTHER): Payer: Medicare Other | Admitting: Oncology

## 2017-03-03 ENCOUNTER — Other Ambulatory Visit (HOSPITAL_BASED_OUTPATIENT_CLINIC_OR_DEPARTMENT_OTHER): Payer: Medicare Other

## 2017-03-03 ENCOUNTER — Ambulatory Visit (HOSPITAL_BASED_OUTPATIENT_CLINIC_OR_DEPARTMENT_OTHER): Payer: Medicare Other

## 2017-03-03 VITALS — BP 134/76

## 2017-03-03 VITALS — BP 130/60 | HR 88 | Temp 97.5°F | Resp 17 | Ht 64.0 in | Wt 116.3 lb

## 2017-03-03 DIAGNOSIS — C787 Secondary malignant neoplasm of liver and intrahepatic bile duct: Secondary | ICD-10-CM

## 2017-03-03 DIAGNOSIS — Z79899 Other long term (current) drug therapy: Secondary | ICD-10-CM

## 2017-03-03 DIAGNOSIS — C182 Malignant neoplasm of ascending colon: Secondary | ICD-10-CM

## 2017-03-03 DIAGNOSIS — C180 Malignant neoplasm of cecum: Secondary | ICD-10-CM | POA: Diagnosis not present

## 2017-03-03 DIAGNOSIS — Z5111 Encounter for antineoplastic chemotherapy: Secondary | ICD-10-CM

## 2017-03-03 DIAGNOSIS — R110 Nausea: Secondary | ICD-10-CM | POA: Diagnosis not present

## 2017-03-03 DIAGNOSIS — Z5112 Encounter for antineoplastic immunotherapy: Secondary | ICD-10-CM | POA: Diagnosis not present

## 2017-03-03 DIAGNOSIS — N183 Chronic kidney disease, stage 3 (moderate): Secondary | ICD-10-CM | POA: Diagnosis not present

## 2017-03-03 DIAGNOSIS — R197 Diarrhea, unspecified: Secondary | ICD-10-CM

## 2017-03-03 LAB — CBC WITH DIFFERENTIAL/PLATELET
BASO%: 0.8 % (ref 0.0–2.0)
Basophils Absolute: 0.1 10*3/uL (ref 0.0–0.1)
EOS%: 3.5 % (ref 0.0–7.0)
Eosinophils Absolute: 0.2 10*3/uL (ref 0.0–0.5)
HCT: 35 % (ref 34.8–46.6)
HGB: 11.5 g/dL — ABNORMAL LOW (ref 11.6–15.9)
LYMPH%: 22 % (ref 14.0–49.7)
MCH: 29 pg (ref 25.1–34.0)
MCHC: 32.9 g/dL (ref 31.5–36.0)
MCV: 88.4 fL (ref 79.5–101.0)
MONO#: 0.6 10*3/uL (ref 0.1–0.9)
MONO%: 9.5 % (ref 0.0–14.0)
NEUT%: 64.2 % (ref 38.4–76.8)
NEUTROS ABS: 4.2 10*3/uL (ref 1.5–6.5)
Platelets: 325 10*3/uL (ref 145–400)
RBC: 3.96 10*6/uL (ref 3.70–5.45)
RDW: 17.9 % — ABNORMAL HIGH (ref 11.2–14.5)
WBC: 6.5 10*3/uL (ref 3.9–10.3)
lymph#: 1.4 10*3/uL (ref 0.9–3.3)
nRBC: 0 % (ref 0–0)

## 2017-03-03 LAB — COMPREHENSIVE METABOLIC PANEL
ALT: 7 U/L (ref 0–55)
AST: 16 U/L (ref 5–34)
Albumin: 3.5 g/dL (ref 3.5–5.0)
Alkaline Phosphatase: 80 U/L (ref 40–150)
Anion Gap: 12 mEq/L — ABNORMAL HIGH (ref 3–11)
BILIRUBIN TOTAL: 0.33 mg/dL (ref 0.20–1.20)
BUN: 13.1 mg/dL (ref 7.0–26.0)
CALCIUM: 9.4 mg/dL (ref 8.4–10.4)
CO2: 23 mEq/L (ref 22–29)
CREATININE: 1.3 mg/dL — AB (ref 0.6–1.1)
Chloride: 108 mEq/L (ref 98–109)
EGFR: 45 mL/min/{1.73_m2} — ABNORMAL LOW (ref >60)
Glucose: 61 mg/dl — ABNORMAL LOW (ref 70–140)
Potassium: 3.7 mEq/L (ref 3.5–5.1)
Sodium: 142 mEq/L (ref 136–145)
Total Protein: 7.6 g/dL (ref 6.4–8.3)

## 2017-03-03 LAB — UA PROTEIN, DIPSTICK - CHCC: Protein, ur: 100 mg/dL

## 2017-03-03 LAB — CEA (IN HOUSE-CHCC): CEA (CHCC-IN HOUSE): 9.96 ng/mL — AB (ref 0.00–5.00)

## 2017-03-03 MED ORDER — ATROPINE SULFATE 1 MG/ML IJ SOLN
INTRAMUSCULAR | Status: AC
  Filled 2017-03-03: qty 1

## 2017-03-03 MED ORDER — SODIUM CHLORIDE 0.9 % IV SOLN: Freq: Once | INTRAVENOUS | Status: AC

## 2017-03-03 MED ORDER — DEXAMETHASONE SODIUM PHOSPHATE 10 MG/ML IJ SOLN
10.0000 mg | Freq: Once | INTRAMUSCULAR | Status: AC
  Administered 2017-03-03: 10 mg via INTRAVENOUS

## 2017-03-03 MED ORDER — DEXAMETHASONE SODIUM PHOSPHATE 10 MG/ML IJ SOLN
INTRAMUSCULAR | Status: AC
  Filled 2017-03-03: qty 1

## 2017-03-03 MED ORDER — IRINOTECAN HCL CHEMO INJECTION 100 MG/5ML
150.0000 mg/m2 | Freq: Once | INTRAVENOUS | Status: AC
  Administered 2017-03-03: 240 mg via INTRAVENOUS
  Filled 2017-03-03: qty 12

## 2017-03-03 MED ORDER — PALONOSETRON HCL INJECTION 0.25 MG/5ML
0.2500 mg | Freq: Once | INTRAVENOUS | Status: AC
  Administered 2017-03-03: 0.25 mg via INTRAVENOUS

## 2017-03-03 MED ORDER — SODIUM CHLORIDE 0.9 % IV SOLN
5.0000 mg/kg | Freq: Once | INTRAVENOUS | Status: AC
  Administered 2017-03-03: 275 mg via INTRAVENOUS
  Filled 2017-03-03: qty 11

## 2017-03-03 MED ORDER — PALONOSETRON HCL INJECTION 0.25 MG/5ML
INTRAVENOUS | Status: AC
  Filled 2017-03-03: qty 5

## 2017-03-03 MED ORDER — SODIUM CHLORIDE 0.9 % IV SOLN
Freq: Once | INTRAVENOUS | Status: AC
  Administered 2017-03-03: 12:00:00 via INTRAVENOUS

## 2017-03-03 MED ORDER — ATROPINE SULFATE 1 MG/ML IJ SOLN
0.5000 mg | Freq: Once | INTRAMUSCULAR | Status: AC | PRN
Start: 2017-03-03 — End: 2017-03-03
  Administered 2017-03-03: 0.5 mg via INTRAVENOUS

## 2017-03-03 MED FILL — URO-MP CAPSULE: 118 | 15 days supply | Qty: 30 | Fill #0

## 2017-03-03 MED FILL — AMLODIPINE BESYLATE 5 MG TA: 5 | 30 days supply | Qty: 30 | Fill #4

## 2017-03-03 NOTE — Telephone Encounter (Signed)
No 12/28 los.  

## 2017-03-03 NOTE — Patient Instructions (Signed)
[facility] Discharge Instructions for Patients Receiving Chemotherapy  Today you received the following chemotherapy agents :  Irinotecan,  Avastin.  To help prevent nausea and vomiting after your treatment, we encourage you to take your nausea medication as prescribed.   If you develop nausea and vomiting that is not controlled by your nausea medication, call the clinic.   BELOW ARE SYMPTOMS THAT SHOULD BE REPORTED IMMEDIATELY:  *FEVER GREATER THAN 100.5 F  *CHILLS WITH OR WITHOUT FEVER  NAUSEA AND VOMITING THAT IS NOT CONTROLLED WITH YOUR NAUSEA MEDICATION  *UNUSUAL SHORTNESS OF BREATH  *UNUSUAL BRUISING OR BLEEDING  TENDERNESS IN MOUTH AND THROAT WITH OR WITHOUT PRESENCE OF ULCERS  *URINARY PROBLEMS  *BOWEL PROBLEMS  UNUSUAL RASH Items with * indicate a potential emergency and should be followed up as soon as possible.  Feel free to call the clinic should you have any questions or concerns. The clinic phone number is [phone].  Please show the CHEMO ALERT CARD at check-in to the Emergency Department and triage nurse.   

## 2017-03-03 NOTE — Progress Notes (Signed)
Per Dr. Burr Medico, [place] for pt to receive Avastin with urine protein of 100

## 2017-03-03 NOTE — Progress Notes (Signed)
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 female returns for routine follow-up visit accompanied by her daughter.  The patient has no specific complaints.  The daughter mentions that the patient had diarrhea on Christmas day likely due to something that she ate.  She has had no further diarrhea.  She denies fevers and chills.  Denies chest pain, shortness of breath, cough, hemoptysis.  Denies nausea, vomiting, constipation.  The patient is here for evaluation prior to her next cycle of chemotherapy.  MEDICAL HISTORY: Past Medical History:  Diagnosis Date  . Anxiety   . Arthritis   . Bilateral renal cysts   . Centrilobular emphysema ([place])   . Chronic low back pain   . CKD (chronic kidney disease), stage III ([place])   . Colon cancer [facility]) oncologist-  dr [name]   dx 01-23-2015  right colon Invasive adenocarcinoma into pericecal connective tissue and involving small intestine , Stage IIc, Grade 2, LVI(-),  (pT4b N0 M0) /  s/p resection termial ileum and cecum and chemotherapy 01-06-217 to 07-06-2015/   08/ 2017  per PET  recurrent w/ liver mets and peritoneal carcinomatosis  . Complication of anesthesia    can be combative due to dementia  . Full dentures   . History of gastroesophageal reflux (GERD)    changes diet  . History of pelvic fracture   . Hyperlipidemia   . Hypertension   . Liver metastasis ([place])   . 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 Pulmonary nodules   . Right ovarian cyst   . 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 UTI (urinary tract infection) 09/26/2016   5 day antibiotic treatment    ALLERGIES:  is allergic to namenda [memantine hcl] and oxycodone.  MEDICATIONS:  Current Outpatient Medications  Medication Sig Dispense Refill  . amLODipine (NORVASC) 5 MG tablet Take 5 mg by mouth every morning.     . Cholecalciferol (VITAMIN D3) 2000 UNITS TABS Take by mouth daily.    . Coenzyme Q10 (CO Q 10) 100 MG CAPS Take by mouth daily.    . Lutein-Zeaxanthin 25-5 MG CAPS Take by mouth.    . Meth-Hyo-M Bl-Na Phos-Ph Sal (URIBEL) 118 MG CAPS Take 1 capsule (118 mg total) by mouth 2 (two) times daily as needed. 30 capsule 3  . Meth-Hyo-M Bl-Na Phos-Ph Sal (URO-MP) 118 MG CAPS     . mirabegron ER (MYRBETRIQ) 50 MG TB24 tablet Take 50 mg by mouth daily.    . mirtazapine (REMERON) 15 MG tablet TAKE 1 TABLET BY MOUTH AT BEDTIME 30 tablet 3  . Multiple Vitamins-Minerals (OCUVITE-LUTEIN PO) Take 1 tablet by mouth daily.    . Multiple Vitamins-Minerals (WOMENS MULTIVITAMIN PO) Take 1 tablet by mouth daily.    [place] Kitchen MYRBETRIQ 25 MG TB24 tablet   11  . polyethylene glycol (MIRALAX / GLYCOLAX) packet Take 17 g by mouth daily. (Patient taking differently: Take 17 g by mouth daily as needed. ) 14 each 0  . prochlorperazine (COMPAZINE) 5 MG tablet Take  1 tablet (5 mg total) by mouth every 6 (six) hours as needed for nausea or vomiting. 30 tablet 0  . QUEtiapine (SEROQUEL) 25 MG tablet Take 1 tablet (25 mg total) by mouth at bedtime. Take 1/2 tablet at night for 1 week, then increase to 1 tablet at night 30 tablet 6  . traMADol (ULTRAM) 50 MG tablet Take 1 tablet (50 mg total) by mouth every 6 (six) hours as needed. 30 tablet 0  . UNABLE TO FIND Med Name: Closys Mouth Rinse two-three times daily for ulcer.    . vitamin B-12 (CYANOCOBALAMIN) 1000 MCG tablet Take 1,000 mcg by mouth daily.     No current facility-administered medications for this visit.    Facility-Administered  Medications Ordered in Other Visits  Medication Dose Route Frequency Provider Last Rate Last Dose  . heparin lock flush 100 unit/mL  500 Units Intravenous Once [name], MD        SURGICAL HISTORY:  Past Surgical History:  Procedure Laterality Date  . CATARACT EXTRACTION W/ INTRAOCULAR LENS  IMPLANT, BILATERAL  2005 approx  . CYSTOSCOPY W/ URETERAL STENT PLACEMENT Right 12/03/2015   Procedure: CYSTOSCOPY WITH RETROGRADE PYELOGRAM/URETERAL STENT PLACEMENT;  Surgeon: Cleon Gustin, MD;  Location: [facility];  Service: Urology;  Laterality: Right;  . CYSTOSCOPY W/ URETERAL STENT PLACEMENT Right 02/15/2016   Procedure: CYSTOSCOPY WITH RETROGRADE PYELOGRAM/URETERAL STENT REPLACEMENT;  Surgeon: Cleon Gustin, MD;  Location: [facility];  Service: Urology;  Laterality: Right;  . CYSTOSCOPY W/ URETERAL STENT PLACEMENT Right 03/28/2016   Procedure: CYSTOSCOPY WITH RETROGRADE PYELOGRAM/URETERAL STENT EXCHANGE;  Surgeon: Cleon Gustin, MD;  Location: [facility];  Service: Urology;  Laterality: Right;  . CYSTOSCOPY W/ URETERAL STENT PLACEMENT Right 07/18/2016   Procedure: CYSTOSCOPY WITH RETROGRADE PYELOGRAM/ STENT EXCHANGE;  Surgeon: Cleon Gustin, MD;  Location: [facility];  Service: Urology;  Laterality: Right;  . CYSTOSCOPY W/ URETERAL STENT PLACEMENT Right 10/10/2016   Procedure: CYSTOSCOPY WITH RETROGRADE PYELOGRAM/URETERAL STENT REPLACEMENT;  Surgeon: Cleon Gustin, MD;  Location: [facility];  Service: Urology;  Laterality: Right;  . CYSTOSCOPY WITH RETROGRADE PYELOGRAM, URETEROSCOPY AND STENT PLACEMENT Right 09/21/2015   Procedure: CYSTOSCOPY WITH BIL RETROGRADE PYELOGRAM, URETEROSCOPY, RIGHT STENT PLACEMENT ;  Surgeon: Cleon Gustin, MD;  Location: [facility];  Service: Urology;  Laterality: Right;  . LAPAROSCOPY N/A 01/23/2015   Procedure: DIAGNOSTIC LAPAROSCOPY, EXPLORATORY  LAPAROTOMY  RIGHT HEMI-COLECTOMY FOR OBSTRUCTION OF RIGHT TERMINAL ILEUM;  Surgeon: [name], MD;  Location: [facility];  Service: General;  Laterality: N/A;    REVIEW OF SYSTEMS:   Review of Systems  Constitutional: Negative for appetite change, chills, fatigue, fever and unexpected weight change.  HENT:   Negative for mouth sores, nosebleeds, sore throat and trouble swallowing.   Eyes: Negative for eye problems and icterus.  Respiratory: Negative for cough, hemoptysis, shortness of breath and wheezing.   Cardiovascular: Negative for chest pain and leg swelling.  Gastrointestinal: Negative for abdominal pain, constipation, diarrhea, nausea and vomiting.  Genitourinary: Negative for bladder incontinence, difficulty urinating, dysuria, frequency and hematuria.   Musculoskeletal: Negative for back pain, gait problem, neck pain and neck stiffness.  Skin: Negative for itching and rash.  Neurological: Negative for dizziness, extremity weakness, gait problem, headaches, light-headedness and seizures.  Hematological: Negative for adenopathy. Does not bruise/bleed easily.  Psychiatric/Behavioral: Negative for confusion, depression and sleep disturbance. The patient is not nervous/anxious.     PHYSICAL EXAMINATION:  Blood pressure (!) 158/94, pulse 88, temperature (!) 97.5 F (36.4 C), temperature source Oral, resp. rate 17, height 5' 4" (1.626 m), weight 116 lb 4.8 oz (52.8 kg), SpO2 100 %.  ECOG PERFORMANCE STATUS: 1 - Symptomatic but completely ambulatory  Physical Exam  Constitutional: Oriented to person, place, and time and well-developed, well-nourished, and in no distress. No distress.  HENT:  Head: Normocephalic and atraumatic.  Mouth/Throat: Oropharynx is clear and moist. No oropharyngeal exudate.  Eyes: Conjunctivae are normal. Right eye exhibits no discharge. Left eye exhibits no discharge. No scleral icterus.  Neck: Normal range of motion. Neck supple.  Cardiovascular: Normal  rate, regular rhythm, normal heart sounds and intact distal pulses.   Pulmonary/Chest: Effort normal and breath sounds normal. No respiratory distress. No wheezes. No rales.  Abdominal: Soft. Bowel sounds are normal. Exhibits no distension and no mass. There is no tenderness.  Musculoskeletal: Normal range of motion. Exhibits no edema.  Lymphadenopathy:    No cervical adenopathy.  Neurological: Alert and oriented to person, place, and time. Exhibits normal muscle tone. Gait normal. Coordination normal.  Skin: Skin is warm and dry. No rash noted. Not diaphoretic. No erythema. No pallor.  Psychiatric: Mood, memory and judgment normal.  Vitals reviewed.  LABORATORY DATA: Lab Results  Component Value Date   WBC 6.5 03/03/2017   HGB 11.5 (L) 03/03/2017   HCT 35.0 03/03/2017   MCV 88.4 03/03/2017   PLT 325 03/03/2017      Chemistry      Component Value Date/Time   NA 142 03/03/2017 0944   K 3.7 03/03/2017 0944   CL 105 09/21/2015 1052   CO2 23 03/03/2017 0944   BUN 13.1 03/03/2017 0944   CREATININE 1.3 (H) 03/03/2017 0944      Component Value Date/Time   CALCIUM 9.4 03/03/2017 0944   ALKPHOS 80 03/03/2017 0944   AST 16 03/03/2017 0944   ALT 7 03/03/2017 0944   BILITOT 0.33 03/03/2017 0944       RADIOGRAPHIC STUDIES:  No results found.   ASSESSMENT/PLAN:  76 y.o. African-American female, with past medical history of moderate dementia, independent with ADLs, hypertension, presents with small bowel obstruction and weight loss.  1. 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 -I personally reviewed her recent PET scan images, unfortunately the scan showed hypermetabolic peritoneal  and liver metastases. 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 The tumor has MSI stable, she is not a candidate for immunotherapy, although she may be a candidate for clinical trial of immunotherapy plus MEK inhibitor at [facility]. -She started Xeloda 1582m bid, 2 weeks on and one week off on 11/11/15 and stopped 05/20/16 due to disease progression.  -I previously reviewed her restaging CT scan from 01/08/2016, which showed overall stable disease, no new lesions. We'll continue her chemotherapy. -I previously reviewed the CT from 05/19/2016 with the patient and her daughter in detail.  She has had a moderate disease progression in lungs, liver and peritoneum. I recommended her to change treatment. -She started second line single agent irinotecan on 06/03/16 at a reduced dose 145m/m2, tolerated well. -The goal of therapy is palliative, mainly to improve her quality of life. - PET Scan image from 08/09/16 showed good partial response to treatment. We will continue irinotecan and Avastin. -We previously discussed pt being able to take chemo break and she will notify [place] needed  -I will hold Avastin if her urine protein 300 or above  -We discussed her 11/25/16 PET scan. She has small increase in size of some of her small lung nodules, and mild increased FDG of her liver mets, but overall stable disease. I think the benefit of current treatment is getting smaller. We may need to change treatment in 2 months after a repeat scan. Will continue Irinotecan and avastin for now. She agrees.  -We will consider changing her treatment to 5-fu weekly in the future if she has disease  progresses -I have low threshold to stop her chemotherapy due to her comorbidities, especially dementia, if she develops more symptoms or intolerance due to chemotherapy treatment. -The daughter and I previously discussed trying to get advanced home care to help pt and what type of help she is seeking. I also mentioned the options of physical therapy to help with her fatigue.  -I discussed she is tolerating treatment well and can continue for as long as she can tolerate. Her daughter and family agree. -Labs reviewed, Stable Cr at 1.3, no anemia, otherwise adequate to proceed with Irinotecan and avastin today  -Her CEA has been mildly increasing. Today' CEA is pending. Will continue treatment regimen.  -Daughter met with home care but found she would not receive the help she and her mother needed. She will wait on searching for further help.  -Repeat PET scan in 03/2017  for restaging  -F/u in 2 weeks.    2. CKD stage III, right hydroureter  -Her Cr was around 1 in 08/2013, Cr increased to 2.4 on 01/20/2015 when she was diagnosed with colon cancer due to dehydration, improved some and moved around.3-1.5 .  -A previous CT scan showed right hydroureter, which probably contributes to her renal insufficiency. -she had ureter stent placement again in 12/2015, 03/2016, and 04/2016 -Patient had a stent replacement on 07/19/2015 -Previously underwent renal stent exchange.  -Cr is overall stable   3. HTN, dementia  -She will continue her medication and follow-up with her primary care physician. -Her BP has been better controlled lately, we discussed that Avastin can cause hypertension, we'll continue monitoring closely. -Her dementia has been getting worse gradually, which has increased her care burden on her daughter. I previously discussed burn-out prevention with her daughter, she appreciated and has planned to have a care-giver for her mother  -Dementia better on Seroquel    4. Nausea and  diarrhea -Secondary to chemotherapy, overall very mild and manageable  -She is to take Imodium PRN and her nausea medications. -Nausea has improved and diarrhea has resolved.   5. Goal of care discussion  -We previously discussed the incurable nature of her cancer, and the overall poor prognosis, especially if she has progress on chemo -The patient understands the goal of care is palliative. -Due to her advanced age and comorbidities, especially dementia, I have low threshold to switch her to palliative care alone if she does not tolerate chemo well or dementia gets worse, her daughter is on -  board  -I recommend DNR/DNI, pt and her daughter agrees.  -We reviewed what help she could get in home aid service or hospice. They plan to continue with chemotherapy as long as she can tolerate it. I will contact home care service and see if her insurance will cover it.  -Based on consult with socal worker, she will wait on searching for further help  6. Insomnia  -I first suggest melatonin over-the counter and to check with her neurologist.  -If that does not help she can use prescription medication.  -Suggested she increase benadryl to 1.5-2 tabs as needed. She will try white noise machine for non-pharm intervention. -Much better sleeping on Seroquel    7. Social support -Due to her dementia and chemotherapy, she requires 24/7 care at home.  Her daughter is her only caregiver. -I will refer her to advanced home care for nursing, physical therapy, occupational therapy and home aids for bathing etc, referral was made today  -Social worker referral to discuss additional social services for pt   Plan -Labs reviewed, adequate for treatment, will proceed chemo today and continue every 2 weeks -Follow-up visit in 2 weeks. -Restaging PET scan prior to next visit.  All questions were answered. The patient knows to call the clinic with any problems, questions or concerns.   Orders Placed This  Encounter  Procedures  . NM PET Image Restag (PS) Skull Base To Thigh    Standing Status:   Future    Standing Expiration Date:   03/03/2018    Order Specific Question:   If indicated for the ordered procedure, I authorize the administration of a radiopharmaceutical per Radiology protocol    Answer:   Yes    Order Specific Question:   Preferred imaging location?    Answer:   [facility]    Order Specific Question:   Radiology Contrast Protocol - do NOT remove file path    Answer:   [email or web address]    Order Specific Question:   Reason for Exam additional comments    Answer:   Metastatic colon cancer.     Mikey Bussing, DNP, AGPCNP-BC, AOCNP 03/03/17

## 2017-03-07 DIAGNOSIS — Z9221 Personal history of antineoplastic chemotherapy: Secondary | ICD-10-CM

## 2017-03-07 HISTORY — DX: Personal history of antineoplastic chemotherapy: Z92.21

## 2017-03-13 ENCOUNTER — Ambulatory Visit (HOSPITAL_COMMUNITY)
Admission: RE | Admit: 2017-03-13 | Discharge: 2017-03-13 | Disposition: A | Discharge status: Home / Self Care | Payer: Medicare Other | Source: Ambulatory Visit | Attending: Oncology | Admitting: Oncology

## 2017-03-13 DIAGNOSIS — C787 Secondary malignant neoplasm of liver and intrahepatic bile duct: Secondary | ICD-10-CM | POA: Insufficient documentation

## 2017-03-13 DIAGNOSIS — I700 Atherosclerosis of aorta: Secondary | ICD-10-CM | POA: Insufficient documentation

## 2017-03-13 DIAGNOSIS — J439 Emphysema, unspecified: Secondary | ICD-10-CM | POA: Diagnosis not present

## 2017-03-13 DIAGNOSIS — C182 Malignant neoplasm of ascending colon: Secondary | ICD-10-CM | POA: Diagnosis present

## 2017-03-13 LAB — GLUCOSE, CAPILLARY: Glucose-Capillary: 97 mg/dL (ref 65–99)

## 2017-03-13 MED ORDER — FLUDEOXYGLUCOSE F - 18 (FDG) INJECTION
5.7800 | Freq: Once | INTRAVENOUS | Status: AC | PRN
  Administered 2017-03-13: 5.78 via INTRAVENOUS

## 2017-03-15 DIAGNOSIS — I700 Atherosclerosis of aorta: Secondary | ICD-10-CM

## 2017-03-15 HISTORY — DX: Atherosclerosis of aorta: I70.0

## 2017-03-15 NOTE — Progress Notes (Signed)
[place]  [phone] [phone]  Clinic Follow Up Note   Patient Care Team: [name], MD as PCP - General (Internal Medicine) [name], MD as Consulting Physician (Medical Oncology) [name], MD as Consulting Physician (General Surgery) [name], MD as Consulting Physician (Orthopedic Surgery) [name] [name], MD as Consulting Physician (Urology) 03/17/2017     CHIEF COMPLAINTS:  Follow up metastatic colon cancer  Oncology History   Cancer of right colon [facility])   Staging form: Colon and Rectum, AJCC 7th Edition     Pathologic stage from 01/23/2015: Stage IIC (T4b, N0, cM0) - Signed by [name], MD on 02/05/2015       Cancer of right colon ([place])   01/21/2015 Imaging     CT abdomen and pelvis showed high-grade small bowel obstruction,  right I [place] ureteral nephrosis,  multiple liver cysts.  CT chest was negative for metastatic lesion.      01/23/2015 Pathology Results     invasive adenocarcinoma extending into the pericecal connective tissue,  grade 2, LVI (-),  no perineural invasion, 3 lymph nodes were negative.      01/23/2015 Surgery      terminal ileum and cecum seegmental resection with anastomosis by Dr. Hassell Done      01/23/2015 Initial Diagnosis    Cancer of right colon ([place])      03/13/2015 - 07/06/2015 Chemotherapy    Xeloda 1500 mg bid X 14 days on-7 off, s/p 5 cycles, stopped early due to wrosening renal function      10/28/2015 Progression    PET scan showed multiple hypermetabolic peritoneal nodules, right ovarian cystic mass, and hypermetabolic liver lesion, highly suspicious for metastatic colon cancer. Small pulmonary nodules are indeterminate.       11/11/2015 - 05/20/2016 Chemotherapy    Xeloda '1500mg'$  bid, 2 weeks on and one week off, Avastin was added on from cycle 3. Xeloda held from 05/20/16 due to progression on CT from 05/19/16 and total bilirubin increased to 2.6.      01/08/2016 Imaging    Ct chest, Abdomen Pelvis Wo Contrast 01/08/2016  IMPRESSION: 1. 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 2. Hepatic and renal cysts. 3.  Aortoiliac atherosclerotic vascular disease. 4. Lumbar spondylosis and degenerative disc disease with multilevel Impingement.      05/19/2016 Imaging    CT CAP w Contrast IMPRESSION: 1. Numerous small solid pulmonary nodules, one of which is new, and several of which have mildly increased in size since 10/28/2015, worrisome for mild progression of pulmonary metastases. 2. Solitary right liver lobe metastasis is mildly increased in size since 01/08/2016. 3. Peritoneal metastases have mildly increased in size since 01/08/2016, largest in the right pelvic sidewall. 4. Well-positioned right nephroureteral stent without overt right hydronephrosis. 5. Additional findings include aortic atherosclerosis, 1 vessel coronary atherosclerosis and mild to moderate emphysema.      06/03/2016 -  Chemotherapy    Second-Line Irinotecan and Avastin every 2 weeks starting 06/03/16      07/29/2016 Tumor Marker    CEA 6.51       08/09/2016 Imaging    PET  IMPRESSION: 1. Overall mild improvement compared to FDG PET scan of 10/28/2015. 2. Pulmonary nodules are increased in size from PET-CT scan and similar size to most recent CT scan. Largest lesion RIGHT lower lobe does have associated faint metabolic activity consistent with pulmonary metastasis. 3. Decrease in metabolic activity of RIGHT hepatic lobe lesion.  Potential second lesion LEFT hepatic lobe with slightly lower metabolic activity. 4. Decreased metabolic activity at the enteric colonic anastomosis in the RIGHT lower quadrant. 5. Interval decrease in metabolic activity of peritoneal nodular metastasis. 6. Nodular implant implant along ventral abdominal wall may be within the musculature, but also improved.      09/09/2016 Tumor Marker     CEA: 7.47      10/07/2016 Tumor Marker    CEA: 7.85      11/04/2016 Tumor Marker    CEA: 7.75      11/25/2016 PET scan    PET 11/25/16 IMPRESSION: 1. Interval worsening in the interval. Numerous pulmonary nodules are again seen. While multiple nodules are stable, there are several nodules which are a little larger in the interval. They remain too small to completely characterize with PET-CT. The 2 hepatic metastases demonstrate increased FDG uptake on today's study compared to the previous study. Abdominal wall and peritoneal metastases are mildly worsened.      12/02/2016 Tumor Marker    CEA: 8.13      01/06/2017 Tumor Marker    CEA: 10.36      02/03/2017 Tumor Marker    CEA: 12.60      03/03/2017 Tumor Marker    CEA: 9.96      03/13/2017 PET scan    IMPRESSION: 1. Hepatic metastases are stable in size with stable to minimally decreased hypermetabolism. 2. Inferior left rectus abdominus metastasis shows mildly decreased hypermetabolism. 3. Omental nodule no longer shows abnormal hypermetabolism. 4. Scattered pulmonary nodules are stable in size and too small for PET resolution. 5.  Aortic atherosclerosis (ICD10-170.0). 6.  Emphysema (ICD10-J43.9).       HISTORY OF PRESENTING ILLNESS (02/05/2015):  Beth Wilkins 77 y.o. female  with past medical history of moderate dementia, hypertension, is here because of recently diagnosed stage II colon cancer. She is accompanied to the clinic by her daughter. History is mainly obtained from the chart and her daughter.  She has had abdominal discomfort, nausea and vomiting, and anorexia for  the past to 3 months along with 15 pounds weight loss, . Her nausea was mild and intermittent, she does not seek medical attention initially. She presented with  worsening symptoms for 2-3 weeks, and episodes of projectile vomiting and dehydration to emergency room, was admitted to [facility] on 01/20/2015. CT scan reviewed small bowel  obstruction, and possible appendiceal rupture. She was brought to OR on 01/23/2015, underwent right hemicolectomy with anastomosis by Dr. Hassell Done. Surgical path reviewed adenocarcinoma at the cecum. She was discharged home on 01/26/2015.  She has recovered well, eating much better than prior to surgery, move around without restriction. No pain, she has loose BM 3 times a day, and she has backed off her miralax.  she lives with her daughter. She states her appetite is moderate, sometime low, and she refuses to eat if she doesn't have appetite. No nausea, vomiting, abdominal bloating since her surgery.  Her last colonoscopy was 6-8 years ago, and her stool OB was negative this year.   CURRENT THERAPY: Second-Line Irinotecan and Avastin every 2 weeks started 06/03/16, Avastin held on 03/17/2017 due to proteinuria  INTERIM HISTORY:   Beth Wilkins is here for a follow up. She presents to the clinic today accompanied by her daughter. She is doing well overall. Her appetite has stayed steady. She has continued constipation that is bearable and she treats with OTC medication.  She reports fatigue after chemotherapy for 2  days and then her energy returns to normal. In early December, she had some urinary discomfort for which she was treated with antibiotics. No symptoms today.  Pt had PET scan on 03/13/17 that revealed that hepatic metastases are stable in size with stable to minimally decreased hypermetabolism, the inferior left rectus abdominus metastasis shows mildly decreased hypermetabolism, the omental nodule no longer shows abnormal hypermetabolism. Scattered pulmonary nodules are stable in size and too small for PET resolution. 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  On review of systems, pt denies fever, chills, rash, mouth sores, decreased appetite, urinary complaints. Denies pain. Pt denies abdominal pain, nausea, vomiting. Pertinent positives are listed and detailed within the above HPI.      MEDICAL HISTORY:  Past Medical History:  Diagnosis Date  . Anxiety   . Arthritis   . Bilateral renal cysts   . Centrilobular emphysema ([place])   . Chronic low back pain   . CKD (chronic kidney disease), stage III ([place])   . Colon cancer [facility]) oncologist-  dr [name]   dx 01-23-2015  right colon Invasive adenocarcinoma into pericecal connective tissue and involving small intestine , Stage IIc, Grade 2, LVI(-),  (pT4b N0 M0) /  s/p resection termial ileum and cecum and chemotherapy 01-06-217 to 07-06-2015/   08/ 2017  per PET  recurrent w/ liver mets and peritoneal carcinomatosis  . Complication of anesthesia    can be combative due to dementia  . Full dentures   . History of gastroesophageal reflux (GERD)    changes diet  . History of pelvic fracture   . Hyperlipidemia   . Hypertension   . Liver metastasis ([place])   . 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 Pulmonary nodules   . Right ovarian cyst   . 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 UTI (urinary tract infection) 09/26/2016   5 day antibiotic treatment    SURGICAL HISTORY: Past Surgical History:  Procedure Laterality Date  . CATARACT EXTRACTION W/ INTRAOCULAR LENS  IMPLANT, BILATERAL  2005 approx  . CYSTOSCOPY W/ URETERAL STENT PLACEMENT Right 12/03/2015   Procedure: CYSTOSCOPY WITH RETROGRADE PYELOGRAM/URETERAL STENT PLACEMENT;  Surgeon: Cleon Gustin, MD;  Location: [facility];  Service: Urology;  Laterality: Right;  . CYSTOSCOPY W/ URETERAL STENT PLACEMENT Right 02/15/2016   Procedure: CYSTOSCOPY WITH RETROGRADE PYELOGRAM/URETERAL STENT REPLACEMENT;  Surgeon: Cleon Gustin, MD;  Location: [facility];  Service: Urology;  Laterality: Right;  . CYSTOSCOPY W/ URETERAL STENT PLACEMENT Right  03/28/2016   Procedure: CYSTOSCOPY WITH RETROGRADE PYELOGRAM/URETERAL STENT EXCHANGE;  Surgeon: Cleon Gustin, MD;  Location[facility];  Service: Urology;  Laterality: Right;  . CYSTOSCOPY W/ URETERAL STENT PLACEMENT Right 07/18/2016   Procedure: CYSTOSCOPY WITH RETROGRADE PYELOGRAM/ STENT EXCHANGE;  Surgeon: Cleon Gustin, MD;  Location: [facility];  Service: Urology;  Laterality: Right;  . CYSTOSCOPY W/ URETERAL STENT PLACEMENT Right 10/10/2016   Procedure: CYSTOSCOPY WITH RETROGRADE PYELOGRAM/URETERAL STENT REPLACEMENT;  Surgeon: Cleon Gustin, MD;  Location: [facility];  Service: Urology;  Laterality: Right;  . CYSTOSCOPY WITH RETROGRADE PYELOGRAM, URETEROSCOPY AND STENT PLACEMENT Right 09/21/2015   Procedure: CYSTOSCOPY WITH BIL RETROGRADE PYELOGRAM, URETEROSCOPY, RIGHT STENT PLACEMENT ;  Surgeon: Cleon Gustin, MD;  Location: [facility];  Service: Urology;  Laterality: Right;  . LAPAROSCOPY N/A 01/23/2015   Procedure: DIAGNOSTIC LAPAROSCOPY, EXPLORATORY LAPAROTOMY  RIGHT HEMI-COLECTOMY FOR OBSTRUCTION OF RIGHT TERMINAL ILEUM;  Surgeon: [name], MD;  Location: [facility];  Service: General;  Laterality: N/A;    SOCIAL HISTORY: Social History   Socioeconomic History  . Marital status: Single    Spouse name: Not on file  . Number of children: Not on file  . Years of education: Not on file  . Highest education level: Not on file  Social Needs  . Financial resource strain: Not on file  . Food insecurity - worry: Not on file  . Food insecurity - inability: Not on file  . Transportation needs - medical: Not on file  . Transportation needs - non-medical: Not on file  Occupational History  . Not on file  Tobacco Use  . Smoking status: Former Smoker    Packs/day: 0.25    Years: 50.00    Pack years: 12.50    Types: Cigarettes    Last attempt to quit: 01/25/2015    Years since quitting: 2.1  .  Smokeless tobacco: Never Used  Substance and Sexual Activity  . Alcohol use: Yes    Alcohol/week: 1.8 oz    Types: 2 Glasses of wine, 1 Cans of beer per week    Comment: occasional   . Drug use: No  . Sexual activity: Not on file  Other Topics Concern  . Not on file  Social History Narrative   Legally separated   Lives w/daughter, Lamerle Jabs   Has total of #2 daughters and #1 son   Dementia   Fall Risk-ambulates w/cane    FAMILY HISTORY: Family History  Problem Relation Age of Onset  . Diabetes Mellitus II Father   . Cancer Mother 41       ovarian cancer   . Cervical cancer Other     ALLERGIES:  is allergic to namenda [memantine hcl] and oxycodone.  MEDICATIONS:  Current Outpatient Medications  Medication Sig Dispense Refill  . amLODipine (NORVASC) 5 MG tablet Take 5 mg by mouth every morning.     . Cholecalciferol (VITAMIN D3) 2000 UNITS TABS Take by mouth daily.    . Coenzyme Q10 (CO Q 10) 100 MG CAPS Take by mouth daily.    . Lutein-Zeaxanthin 25-5 MG CAPS Take by mouth.    . Meth-Hyo-M Bl-Na Phos-Ph Sal (URIBEL) 118 MG CAPS Take 1 capsule (118 mg total) by mouth 2 (two) times daily as needed. 30 capsule 3  . Meth-Hyo-M Bl-Na Phos-Ph Sal (URO-MP) 118 MG CAPS     . mirabegron ER (MYRBETRIQ) 50 MG TB24 tablet Take 50 mg by mouth daily.    . mirtazapine (REMERON) 15 MG tablet TAKE 1 TABLET BY MOUTH AT BEDTIME 30 tablet 3  . Multiple Vitamins-Minerals (OCUVITE-LUTEIN PO) Take 1 tablet by mouth daily.    . Multiple Vitamins-Minerals (WOMENS MULTIVITAMIN PO) Take 1 tablet by mouth daily.    Marland Kitchen MYRBETRIQ 25 MG TB24 tablet   11  . polyethylene glycol (MIRALAX / GLYCOLAX) packet Take 17 g by mouth daily. (Patient taking differently: Take 17 g by mouth daily as needed. ) 14 each 0  . prochlorperazine (COMPAZINE) 5 MG tablet Take 1 tablet (5 mg total) by mouth every 6 (six) hours as needed for nausea or vomiting. 30 tablet 0  . QUEtiapine (SEROQUEL) 25 MG tablet Take 1  tablet (25 mg total) by mouth at bedtime. Take 1/2 tablet at night  for 1 week, then increase to 1 tablet at night 30 tablet 6  . traMADol (ULTRAM) 50 MG tablet Take 1 tablet (50 mg total) by mouth every 6 (six) hours as needed. 30 tablet 0  . UNABLE TO FIND Med Name: Closys Mouth Rinse two-three times daily for ulcer.    . vitamin B-12 (CYANOCOBALAMIN) 1000 MCG tablet Take 1,000 mcg by mouth daily.     No current facility-administered medications for this visit.    Facility-Administered Medications Ordered in Other Visits  Medication Dose Route Frequency Provider Last Rate Last Dose  . heparin lock flush 100 unit/mL  500 Units Intravenous Once [name], MD        REVIEW OF SYSTEMS:  Constitutional: Denies fevers, chills or abnormal night sweats (+) Improved sleep cycle Eyes: Denies blurriness of vision, double vision or watery eyes  Ears, nose, mouth, throat, and face: Denies mucositis or sore throat  Respiratory: Denies cough, dyspnea or wheezes Cardiovascular: Denies palpitation, chest discomfort or lower extremity swelling Gastrointestinal:  Denies heartburn  Urinary:  Denies Dysuria  Skin: Denies abnormal skin rashes Lymphatics: Denies new lymphadenopathy or easy bruising Neurological:Denies numbness, tingling or new weaknesses Behavioral/Psych: Mood is stable, no new changes (+) Dementia All other systems were reviewed with the patient and are negative.  PHYSICAL EXAMINATION:  ECOG PERFORMANCE STATUS: 1 - Symptomatic but completely ambulatory  Vitals:   03/17/17 0920  BP: 140/89  Pulse: 87  Resp: 18  Temp: 97.6 F (36.4 C)  SpO2: 100%   Filed Weights   03/17/17 0920  Weight: 113 lb 6.4 oz (51.4 kg)     GENERAL:alert, no distress and comfortable SKIN: skin color, texture, turgor are normal, no rashes or significant lesions EYES: normal, conjunctiva are pink and non-injected, sclera clear (+) difficult to evaluate due to her dementia OROPHARYNX:no exudate (+) Upper  and lower dentures  (+) slight color change of tongue  NECK: supple, thyroid normal size, non-tender, without nodularity LYMPH:  no palpable lymphadenopathy in the cervical, axillary or inguinal LUNGS: clear to auscultation and percussion with normal breathing effort HEART: regular rate & rhythm and no murmurs and no lower extremity edema ABDOMEN:abdomen soft, non-tender and normal bowel sounds  Musculoskeletal:no cyanosis of digits and no clubbing  PSYCH: alert and with fluent speech, (+) dementia  NEURO: no focal motor/sensory deficits  LABORATORY DATA:  I have reviewed the data as listed CBC Latest Ref Rng & Units 03/17/2017 03/03/2017 02/17/2017  WBC 3.9 - 10.3 K/uL 6.2 6.5 7.9  Hemoglobin 11.6 - 15.9 g/dL 11.8 11.5(L) 11.8  Hematocrit 34.8 - 46.6 % 36.0 35.0 36.4  Platelets 145 - 400 K/uL 358 325 313    CMP Latest Ref Rng & Units 03/17/2017 03/03/2017 02/17/2017  Glucose 70 - 140 mg/dL 88 61(L) 60(L)  BUN 7 - 26 mg/dL 11 13.1 20.3  Creatinine 0.60 - 1.10 mg/dL 1.21(H) 1.3(H) 1.2(H)  Sodium 136 - 145 mmol/L 141 142 144  Potassium 3.3 - 4.7 mmol/L 3.1(L) 3.7 3.3(L)  Chloride 98 - 109 mmol/L 108 - -  CO2 22 - 29 mmol/L 21(L) 23 21(L)  Calcium 8.4 - 10.4 mg/dL 9.1 9.4 9.2  Total Protein 6.4 - 8.3 g/dL 7.7 7.6 7.2  Total Bilirubin 0.2 - 1.2 mg/dL 0.5 0.33 0.32  Alkaline Phos 40 - 150 U/L 80 80 78  AST 5 - 34 U/L '18 16 18  '$ ALT 0 - 55 U/L '11 7 11    '$ CEA  01/23/2015: 5.7 06/05/2015: 6.1 08/04/2015: 10.3  10/12/2015: 13.29 (in-house)  11/04/2015: 12.83 01/13/2016: 8.57 02/24/2016: 8.41 04/27/2016: 9.22 05/20/2016: 8.32 06/17/2016: 6.45 07/15/2016: 5.07 07/29/2016: 6.51 09/09/16: 7.47 10/07/16: 7.85 11/04/16: 7.75 12/02/16: 8.13 01/06/17: 10.36 02/03/17: 12.60 03/03/18: 9.96    PATHOLOGY REPORT: Diagnosis 01/23/2015 Colon, segmental resection, with terminal ileum and cecum - INVASIVE ADENOCARCINOMA EXTENDING INTO PERICECAL CONNECTIVE TISSUE AND INVOLVING SMALL INTESTINE. -  THREE BENIGN LYMPH NODES (0/3). - SEE ONCOLOGY TABLE BELOW.  Microscopic Comment COLON AND RECTUM (INCLUDING TRANS-ANAL RESECTION): Specimen: Terminal ileum and cecum. Procedure: Resection. Tumor site: Cecum adjacent to appendix. Specimen integrity: Intact. Macroscopic intactness of mesorectum: Not applicable. Macroscopic tumor perforation: No. Invasive tumor: Maximum size: 4.0 cm. Histologic type(s): Colorectal adenocarcinoma. Histologic grade and differentiation: G2: moderately differentiated/low grade. Type of polyp in which invasive carcinoma arose: No residual polyp. Microscopic extension of invasive tumor: Into pericecal connective tissue and terminal ileum. Lymph-Vascular invasion: Not identified. Peri-neural invasion: Not identified. Tumor deposit(s) (discontinuous extramural extension): No. Resection margins: Proximal margin: Free of tumor. Distal margin: Free of tumor. Circumferential (radial) (posterior ascending, posterior descending; lateral and posterior mid-rectum; and entire lower 1/3 rectum): Free of tumor. Mesenteric margin (sigmoid and transverse): N/A. Distance closest margin (if all above margins negative): N/A. Treatment effect (neo-adjuvant therapy): No. Additional polyp(s): No. Non-neoplastic findings: N/A. Lymph nodes: number examined 3; number positive: 0. Pathologic Staging: pT4b, pN0, pMX. Ancillary studies: Mismatch repair protein by immunohistochemistry pending. Comments: There is a colorectal-type adenocarcinoma arising in the cecum in the area of the appendiceal orifice. The tumor extends into the pericecal connective tissue, and involves the attached segment of terminal ileum. Immunohistochemistry shows the tumor is positive with CDX2 and cytokeratin 20, and negative with cytokeratin 7, estrogen receptor and WT-1 supporting a diagnosis of primary colorectal adenocarcinoma. ADDITIONAL INFORMATION: Mismatch Repair (MMR) Protein Immunohistochemistry  (IHC) IHC Expression Result: MLH1: Preserved nuclear expression (greater 50% tumor expression) MSH2: Preserved nuclear expression (greater 50% tumor expression) MSH6: Preserved nuclear expression (greater 50% tumor expression) PMS2: Preserved nuclear expression (greater 50% tumor expression) * Internal control demonstrates intact nuclear expression Interpretation: NORMAL There is preserved expression of the major and minor MMR proteins. There is a very low probability that microsatellite instability (MSI) is present. However, certain clinically significant MMR protein mutations may result in preservation of nuclear expression. It is recommended that the preservation of protein expression be correlated with molecular based MSI testing.    RADIOGRAPHIC STUDIES: I have personally reviewed the radiological images as listed and agreed with the findings in the report.  PET 03/13/17 IMPRESSION: 1. Hepatic metastases are stable in size with stable to minimally decreased hypermetabolism. 2. Inferior left rectus abdominus metastasis shows mildly decreased hypermetabolism. 3. Omental nodule no longer shows abnormal hypermetabolism. 4. Scattered pulmonary nodules are stable in size and too small for PET resolution. 5.  Aortic atherosclerosis (ICD10-170.0). 6.  Emphysema (ICD10-J43.9).  PET 11/25/16 IMPRESSION: 1. Interval worsening in the interval. Numerous pulmonary nodules are again seen. While multiple nodules are stable, there are several nodules which are a little larger in the interval. They remain too small to completely characterize with PET-CT. The 2 hepatic metastases demonstrate increased FDG uptake on today's study compared to the previous study. Abdominal wall and peritoneal metastases are mildly worsened.  PET 08/09/2016 IMPRESSION: 1. Overall mild improvement compared to FDG PET scan of 10/28/2015. 2. Pulmonary nodules are increased in size from PET-CT scan and similar size to most  recent CT scan. Largest lesion RIGHT lower lobe does have associated faint metabolic activity consistent with pulmonary  metastasis. 3. Decrease in metabolic activity of RIGHT hepatic lobe lesion. Potential second lesion LEFT hepatic lobe with slightly lower metabolic activity. 4. Decreased metabolic activity at the enteric colonic anastomosis in the RIGHT lower quadrant. 5. Interval decrease in metabolic activity of peritoneal nodular metastasis. 6. Nodular implant along ventral abdominal wall may be within the musculature, but also improved.  CT CAP w Contrast 05/19/2016 IMPRESSION: 1. Numerous small solid pulmonary nodules, one of which is new, and several of which have mildly increased in size since 10/28/2015, worrisome for mild progression of pulmonary metastases. 2. Solitary right liver lobe metastasis is mildly increased in sizesince 01/08/2016. 3. Peritoneal metastases have mildly increased in size since 01/08/2016, largest in the right pelvic sidewall. 4. Well-positioned right nephroureteral stent without overt right hydronephrosis. 5. Additional findings include aortic atherosclerosis, 1 vessel coronary atherosclerosis and mild to moderate emphysema.  Ct chest, Abdomen Pelvis Wo Contrast 01/08/2016  IMPRESSION: 1. 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 2. Hepatic and renal cysts. 3.  Aortoiliac atherosclerotic vascular disease. 4. Lumbar spondylosis and degenerative disc disease with multilevel impingement.  ASSESSMENT & PLAN: 77 y.o. African-American female, with past medical history of moderate dementia, independent with ADLs, hypertension, presents with small bowel obstruction and weight loss.  1. 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 -I personally reviewed her recent PET scan images, unfortunately the scan showed hypermetabolic peritoneal and liver metastases. 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 The tumor has MSI stable, she is not a candidate for immunotherapy, although she may be a candidate for clinical trial of immunotherapy plus MEK inhibitor at [facility]. -She started Xeloda '1500mg'$  bid, 2 weeks on and one week off on 11/11/15 and stopped 05/20/16 due to disease progression.  -I previously reviewed her restaging CT scan from 01/08/2016, which showed overall stable disease, no new lesions. We'll continue her chemotherapy. -I previously reviewed the CT from 05/19/2016 with the patient and her daughter in detail. She has had a moderate disease progression in lungs, liver and peritoneum. I recommended her to change treatment. -She started second line single agent irinotecan on 06/03/16 at a reduced dose '150mg'$ /m2, tolerated well. -The goal of therapy is palliative, mainly to improve her quality of life. - PET Scan image from 08/09/16 showed good partial response to treatment. We will continue irinotecan and Avastin. -We previously discussed pt being able to take chemo break and she will notify us if needed  -I will hold Avastin if her urine protein 300 or above  -We discussed her 11/25/16 PET scan. She has small increase in size of some of her small lung nodules, and mild increased FDG of her liver mets, but overall stable disease. I think the benefit of current treatment  is getting smaller. We may need to change treatment in 2 months after a repeat scan. Will continue Irinotecan and avastin for  now. She agrees.  -We will consider changing her treatment to 5-fu weekly in the future if she has disease progresses -I have low threshold to stop her chemotherapy due to her comorbidities, especially dementia, if she develops more symptoms or intolerance due to chemotherapy treatment. -The daughter and I previously discussed trying to get advanced home care to help pt and what type of help she is seeking. I also mentioned the options of physical therapy to help with her fatigue.  -I discussed she is tolerating treatment well and can continue for as long as she can tolerate. Her daughter and family agree. -Labs reviewed, Stable Cr at 1.2, no anemia, otherwise adequate to proceed with Irinotecan and avastin today  -Daughter met with home care but found she would not receive the help she and her mother needed. She will wait on searching for further help.  -Her CEA has decreased to 9.96 from 03/03/17 results. -PET scan on 03/13/17 revealed that hepatic metastases are stable in size with stable to minimally decreased hypermetabolism, the inferior left rectus abdominus metastasis shows mildly decreased hypermetabolism, the omental nodule no longer shows abnormal hypermetabolism. Scattered pulmonary nodules are stable in size and too small for PET resolution. 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 Discussed with pt and daughter. They are very pleased. - Today (03/17/17) her urine proteins were elevated at above 300. Will hold Avastin for today.  Given the high protein, I will also get a urinalysis to rule out UTI. -We will continue chemotherapy every 2 weeks. -F/u in 4 weeks with me.    2. CKD stage III, right hydroureter  -Her Cr was around 1 in 08/2013, Cr increased to 2.4 on 01/20/2015 when she was diagnosed with colon cancer due to dehydration, improved some and moved  around.3-1.5 .  -A previous CT scan showed right hydroureter, which probably contributes to her renal insufficiency. -she had ureter stent placement again in 12/2015, 03/2016, and 04/2016 -Patient had a stent replacement on 07/19/2015 -Previously underwent renal stent exchange.  -Cr is overall stable   3. HTN, dementia  -She will continue her medication and follow-up with her primary care physician. -Her BP has been better controlled lately, we discussed that Avastin can cause hypertension, we'll continue monitoring closely. -Her dementia has been getting worse gradually, which has increased her care burden on her daughter. I previously discussed burn-out prevention with her daughter, she appreciated and has planned to have a care-giver for her mother  -Dementia better on Seroquel    4. Nausea and diarrhea -Secondary to chemotherapy, overall very mild and manageable  -She is to take Imodium PRN and her nausea medications. -Nausea has improved and diarrhea has resolved.   5. Goal of care discussion  -We previously discussed the incurable nature of her cancer, and the overall poor prognosis, especially if she has progress on chemo -The patient understands the goal of care is palliative. -Due to her advanced age and comorbidities, especially dementia, I have low threshold to switch her to palliative care alone if she does not tolerate chemo well or dementia gets worse, her daughter is on -board  -I recommend DNR/DNI, pt and her daughter agrees.  -We reviewed what help she could get in home aid service or hospice. They plan to continue with chemotherapy as long as she can tolerate it. I will contact home care service and see if her insurance will cover it.  -Based on consult with socal worker, she will wait on searching for further help  6. Insomnia  -I first suggest melatonin over-the counter and to check with her neurologist.  -If that does not help she can use prescription medication.    -Suggested she increase benadryl to 1.5-2 tabs as needed. She will try white noise machine for non-pharm intervention. -Much better sleeping on Seroquel    7. Social support -Due to her dementia and chemotherapy, she requires 24/7 care at home.  Her daughter is her only caregiver. -I will refer her to advanced home care for nursing, physical therapy, occupational therapy and home aids for bathing etc, referral was made today  -Social worker referral to discuss additional social services for pt    Plan Lab and PET scan reviewed, adequate for treatment, will proceed to chemotherapy irinotecan today.  Due to her high urine protein level, will hold Avastin for today Lab,  and chemo irinotecan every 2 weeks F/u in 4 weeks  Urine test today to rule out a UTI Due to her poor peripheral IV, will get a port placed by IR next week.    All questions were answered. The patient knows to call the clinic with any problems, questions or concerns.  I spent 15 minutes counseling the patient face to face. The total time spent in the appointment was 20 minutes and more than 50% was on counseling.  This document serves as a record of services personally performed by [name], MD. It was created on her behalf by Theresia Bough, a trained medical scribe. The creation of this record is based on the scribe's personal observations and the provider's statements to them.   I have reviewed the above documentation for accuracy and completeness, and I agree with the above.    [name]  03/17/2017

## 2017-03-17 ENCOUNTER — Inpatient Hospital Stay: Payer: Medicare Other | Attending: Hematology | Admitting: Hematology

## 2017-03-17 ENCOUNTER — Inpatient Hospital Stay: Payer: Medicare Other

## 2017-03-17 ENCOUNTER — Encounter: Payer: Self-pay | Admitting: Hematology

## 2017-03-17 ENCOUNTER — Telehealth: Payer: Self-pay | Admitting: Hematology

## 2017-03-17 VITALS — BP 140/89 | HR 87 | Temp 97.6°F | Resp 18 | Ht 64.0 in | Wt 113.4 lb

## 2017-03-17 DIAGNOSIS — F0390 Unspecified dementia without behavioral disturbance: Secondary | ICD-10-CM | POA: Diagnosis not present

## 2017-03-17 DIAGNOSIS — C182 Malignant neoplasm of ascending colon: Secondary | ICD-10-CM

## 2017-03-17 DIAGNOSIS — M129 Arthropathy, unspecified: Secondary | ICD-10-CM | POA: Insufficient documentation

## 2017-03-17 DIAGNOSIS — C7800 Secondary malignant neoplasm of unspecified lung: Secondary | ICD-10-CM | POA: Diagnosis not present

## 2017-03-17 DIAGNOSIS — I2510 Atherosclerotic heart disease of native coronary artery without angina pectoris: Secondary | ICD-10-CM | POA: Insufficient documentation

## 2017-03-17 DIAGNOSIS — R970 Elevated carcinoembryonic antigen [CEA]: Secondary | ICD-10-CM | POA: Insufficient documentation

## 2017-03-17 DIAGNOSIS — Z5111 Encounter for antineoplastic chemotherapy: Secondary | ICD-10-CM | POA: Diagnosis not present

## 2017-03-17 DIAGNOSIS — K521 Toxic gastroenteritis and colitis: Secondary | ICD-10-CM | POA: Diagnosis not present

## 2017-03-17 DIAGNOSIS — D638 Anemia in other chronic diseases classified elsewhere: Secondary | ICD-10-CM

## 2017-03-17 DIAGNOSIS — Z5112 Encounter for antineoplastic immunotherapy: Secondary | ICD-10-CM | POA: Insufficient documentation

## 2017-03-17 DIAGNOSIS — E785 Hyperlipidemia, unspecified: Secondary | ICD-10-CM | POA: Diagnosis not present

## 2017-03-17 DIAGNOSIS — R110 Nausea: Secondary | ICD-10-CM

## 2017-03-17 DIAGNOSIS — F419 Anxiety disorder, unspecified: Secondary | ICD-10-CM | POA: Diagnosis not present

## 2017-03-17 DIAGNOSIS — I700 Atherosclerosis of aorta: Secondary | ICD-10-CM | POA: Diagnosis not present

## 2017-03-17 DIAGNOSIS — R809 Proteinuria, unspecified: Secondary | ICD-10-CM | POA: Insufficient documentation

## 2017-03-17 DIAGNOSIS — Z87891 Personal history of nicotine dependence: Secondary | ICD-10-CM | POA: Insufficient documentation

## 2017-03-17 DIAGNOSIS — M5136 Other intervertebral disc degeneration, lumbar region: Secondary | ICD-10-CM | POA: Insufficient documentation

## 2017-03-17 DIAGNOSIS — I129 Hypertensive chronic kidney disease with stage 1 through stage 4 chronic kidney disease, or unspecified chronic kidney disease: Secondary | ICD-10-CM | POA: Insufficient documentation

## 2017-03-17 DIAGNOSIS — R918 Other nonspecific abnormal finding of lung field: Secondary | ICD-10-CM | POA: Diagnosis not present

## 2017-03-17 DIAGNOSIS — N183 Chronic kidney disease, stage 3 (moderate): Secondary | ICD-10-CM

## 2017-03-17 DIAGNOSIS — C180 Malignant neoplasm of cecum: Secondary | ICD-10-CM | POA: Diagnosis not present

## 2017-03-17 DIAGNOSIS — I10 Essential (primary) hypertension: Secondary | ICD-10-CM | POA: Insufficient documentation

## 2017-03-17 DIAGNOSIS — C787 Secondary malignant neoplasm of liver and intrahepatic bile duct: Secondary | ICD-10-CM | POA: Diagnosis not present

## 2017-03-17 DIAGNOSIS — J439 Emphysema, unspecified: Secondary | ICD-10-CM | POA: Diagnosis not present

## 2017-03-17 DIAGNOSIS — G4700 Insomnia, unspecified: Secondary | ICD-10-CM | POA: Diagnosis not present

## 2017-03-17 DIAGNOSIS — C786 Secondary malignant neoplasm of retroperitoneum and peritoneum: Secondary | ICD-10-CM | POA: Insufficient documentation

## 2017-03-17 LAB — COMPREHENSIVE METABOLIC PANEL
ALBUMIN: 3.6 g/dL (ref 3.5–5.0)
ALK PHOS: 80 U/L (ref 40–150)
ALT: 11 U/L (ref 0–55)
ANION GAP: 12 — AB (ref 3–11)
AST: 18 U/L (ref 5–34)
BILIRUBIN TOTAL: 0.5 mg/dL (ref 0.2–1.2)
BUN: 11 mg/dL (ref 7–26)
CALCIUM: 9.1 mg/dL (ref 8.4–10.4)
CO2: 21 mmol/L — AB (ref 22–29)
CREATININE: 1.21 mg/dL — AB (ref 0.60–1.10)
Chloride: 108 mmol/L (ref 98–109)
GFR calc non Af Amer: 43 mL/min — ABNORMAL LOW (ref >60)
GFR, EST AFRICAN AMERICAN: 49 mL/min — AB (ref >60)
GLUCOSE: 88 mg/dL (ref 70–140)
Potassium: 3.1 mmol/L — ABNORMAL LOW (ref 3.3–4.7)
SODIUM: 141 mmol/L (ref 136–145)
Total Protein: 7.7 g/dL (ref 6.4–8.3)

## 2017-03-17 LAB — CBC WITH DIFFERENTIAL/PLATELET
Basophils Absolute: 0 10*3/uL (ref 0.0–0.1)
Basophils Relative: 1 %
Eosinophils Absolute: 0.1 10*3/uL (ref 0.0–0.5)
Eosinophils Relative: 2 %
HCT: 36 % (ref 34.8–46.6)
Hemoglobin: 11.8 g/dL (ref 11.6–15.9)
Lymphocytes Relative: 17 %
Lymphs Abs: 1 10*3/uL (ref 0.9–3.3)
MCH: 29.4 pg (ref 25.1–34.0)
MCHC: 32.8 g/dL (ref 31.5–36.0)
MCV: 89.8 fL (ref 79.5–101.0)
Monocytes Absolute: 0.4 10*3/uL (ref 0.1–0.9)
Monocytes Relative: 6 %
Neutro Abs: 4.6 10*3/uL (ref 1.5–6.5)
Neutrophils Relative %: 74 %
Platelets: 358 10*3/uL (ref 145–400)
RBC: 4 MIL/uL (ref 3.70–5.45)
RDW: 19.2 % — ABNORMAL HIGH (ref 11.2–16.1)
WBC: 6.2 10*3/uL (ref 3.9–10.3)

## 2017-03-17 LAB — UA PROTEIN, DIPSTICK - CHCC: Protein, ur: >300 mg/dL — AB

## 2017-03-17 MED ORDER — DEXTROSE 5 % IV SOLN
150.0000 mg/m2 | Freq: Once | INTRAVENOUS | Status: AC
  Administered 2017-03-17: 240 mg via INTRAVENOUS
  Filled 2017-03-17: qty 12

## 2017-03-17 MED ORDER — DEXAMETHASONE SODIUM PHOSPHATE 10 MG/ML IJ SOLN
INTRAMUSCULAR | Status: AC
  Filled 2017-03-17: qty 1

## 2017-03-17 MED ORDER — PALONOSETRON HCL INJECTION 0.25 MG/5ML
0.2500 mg | Freq: Once | INTRAVENOUS | Status: AC
  Administered 2017-03-17: 0.25 mg via INTRAVENOUS

## 2017-03-17 MED ORDER — DEXAMETHASONE SODIUM PHOSPHATE 10 MG/ML IJ SOLN
10.0000 mg | Freq: Once | INTRAMUSCULAR | Status: AC
  Administered 2017-03-17: 10 mg via INTRAVENOUS

## 2017-03-17 MED ORDER — PALONOSETRON HCL INJECTION 0.25 MG/5ML
INTRAVENOUS | Status: AC
Start: 2017-03-17 — End: 2017-03-17
  Filled 2017-03-17: qty 5

## 2017-03-17 MED ORDER — SODIUM CHLORIDE 0.9 % IV SOLN
Freq: Once | INTRAVENOUS | Status: AC
  Administered 2017-03-17: 11:00:00 via INTRAVENOUS

## 2017-03-17 MED ORDER — ATROPINE SULFATE 1 MG/ML IJ SOLN
0.5000 mg | Freq: Once | INTRAMUSCULAR | Status: AC | PRN
  Administered 2017-03-17: 0.5 mg via INTRAVENOUS

## 2017-03-17 MED ORDER — ATROPINE SULFATE 1 MG/ML IJ SOLN
INTRAMUSCULAR | Status: AC
  Filled 2017-03-17: qty 1

## 2017-03-17 MED FILL — MIRTAZAPINE 15 MG TAB: 15 | 30 days supply | Qty: 30 | Fill #2

## 2017-03-17 NOTE — Patient Instructions (Signed)
[facility] Discharge Instructions for Patients Receiving Chemotherapy  Today you received the following chemotherapy agents: Irinotecan   To help prevent nausea and vomiting after your treatment, we encourage you to take your nausea medication as directed.    If you develop nausea and vomiting that is not controlled by your nausea medication, call the clinic.   BELOW ARE SYMPTOMS THAT SHOULD BE REPORTED IMMEDIATELY:  *FEVER GREATER THAN 100.5 F  *CHILLS WITH OR WITHOUT FEVER  NAUSEA AND VOMITING THAT IS NOT CONTROLLED WITH YOUR NAUSEA MEDICATION  *UNUSUAL SHORTNESS OF BREATH  *UNUSUAL BRUISING OR BLEEDING  TENDERNESS IN MOUTH AND THROAT WITH OR WITHOUT PRESENCE OF ULCERS  *URINARY PROBLEMS  *BOWEL PROBLEMS  UNUSUAL RASH Items with * indicate a potential emergency and should be followed up as soon as possible.  Feel free to call the clinic should you have any questions or concerns. The clinic phone number is [phone].  Please show the CHEMO ALERT CARD at check-in to the Emergency Department and triage nurse.   

## 2017-03-17 NOTE — Telephone Encounter (Signed)
Gave avs and calendar for January and february °

## 2017-03-20 ENCOUNTER — Other Ambulatory Visit: Payer: Self-pay | Admitting: *Deleted

## 2017-03-20 ENCOUNTER — Telehealth: Payer: Self-pay | Admitting: *Deleted

## 2017-03-20 MED ORDER — POTASSIUM CHLORIDE CRYS ER 20 MEQ PO TBCR: 20.0000 meq | EXTENDED_RELEASE_TABLET | Freq: Every day | ORAL | 0 refills | Status: DC

## 2017-03-20 MED FILL — POTASSIUM CL ER 20 MEQ TAB: 20 | 30 days supply | Qty: 30 | Fill #0

## 2017-03-20 NOTE — Telephone Encounter (Signed)
Called & spoke with daughter, Lorriane Shire & informed of low K+.  She is not on any K+.  Informed will call in K+ to [facility] per her request & to cont to eat K+ rich foods.

## 2017-03-20 NOTE — Telephone Encounter (Signed)
-----   Message from [name], MD sent at 03/19/2017  4:42 PM EST ----- Please let pt's daughter know that her K was low on 1/11, and call in KCL 41meq daily #30 for her if she does not have KCL pills. Encourage her to eat K rich food, thanks  [name]  03/19/2017

## 2017-03-20 NOTE — Telephone Encounter (Signed)
-----   Message from [name], MD sent at 03/19/2017  4:42 PM EST ----- Please let pt's daughter know that her K was low on 1/11, and call in KCL 89meq daily #30 for her if she does not have KCL pills. Encourage her to eat K rich food, thanks  [name]  03/19/2017

## 2017-03-21 MED FILL — QUETIAPINE 25 MG TABLET: 25 | 27 days supply | Qty: 30 | Fill #2

## 2017-03-22 MED FILL — MYRBETRIQ ER 25 MG TABLET: 25 | 30 days supply | Qty: 30 | Fill #5

## 2017-03-23 ENCOUNTER — Other Ambulatory Visit: Payer: Self-pay | Admitting: Radiology

## 2017-03-23 ENCOUNTER — Other Ambulatory Visit: Payer: Self-pay | Admitting: General Surgery

## 2017-03-23 ENCOUNTER — Other Ambulatory Visit: Payer: Self-pay | Admitting: Urology

## 2017-03-24 ENCOUNTER — Ambulatory Visit (HOSPITAL_COMMUNITY)
Admission: RE | Admit: 2017-03-24 | Discharge: 2017-03-24 | Disposition: A | Discharge status: Home / Self Care | Payer: Medicare Other | Source: Ambulatory Visit | Attending: Hematology | Admitting: Hematology

## 2017-03-24 ENCOUNTER — Other Ambulatory Visit: Payer: Self-pay | Admitting: Hematology

## 2017-03-24 ENCOUNTER — Encounter (HOSPITAL_BASED_OUTPATIENT_CLINIC_OR_DEPARTMENT_OTHER): Payer: Self-pay

## 2017-03-24 ENCOUNTER — Encounter (HOSPITAL_COMMUNITY): Payer: Self-pay

## 2017-03-24 DIAGNOSIS — Z9221 Personal history of antineoplastic chemotherapy: Secondary | ICD-10-CM | POA: Insufficient documentation

## 2017-03-24 DIAGNOSIS — Z87891 Personal history of nicotine dependence: Secondary | ICD-10-CM | POA: Insufficient documentation

## 2017-03-24 DIAGNOSIS — I700 Atherosclerosis of aorta: Secondary | ICD-10-CM | POA: Diagnosis not present

## 2017-03-24 DIAGNOSIS — Z8041 Family history of malignant neoplasm of ovary: Secondary | ICD-10-CM | POA: Diagnosis not present

## 2017-03-24 DIAGNOSIS — I129 Hypertensive chronic kidney disease with stage 1 through stage 4 chronic kidney disease, or unspecified chronic kidney disease: Secondary | ICD-10-CM | POA: Insufficient documentation

## 2017-03-24 DIAGNOSIS — C189 Malignant neoplasm of colon, unspecified: Secondary | ICD-10-CM | POA: Diagnosis present

## 2017-03-24 DIAGNOSIS — C182 Malignant neoplasm of ascending colon: Secondary | ICD-10-CM

## 2017-03-24 DIAGNOSIS — J439 Emphysema, unspecified: Secondary | ICD-10-CM | POA: Insufficient documentation

## 2017-03-24 DIAGNOSIS — Z85038 Personal history of other malignant neoplasm of large intestine: Secondary | ICD-10-CM | POA: Diagnosis not present

## 2017-03-24 DIAGNOSIS — Z79899 Other long term (current) drug therapy: Secondary | ICD-10-CM | POA: Diagnosis not present

## 2017-03-24 DIAGNOSIS — N183 Chronic kidney disease, stage 3 (moderate): Secondary | ICD-10-CM | POA: Diagnosis not present

## 2017-03-24 DIAGNOSIS — F0390 Unspecified dementia without behavioral disturbance: Secondary | ICD-10-CM | POA: Diagnosis not present

## 2017-03-24 DIAGNOSIS — Z9049 Acquired absence of other specified parts of digestive tract: Secondary | ICD-10-CM | POA: Diagnosis not present

## 2017-03-24 HISTORY — PX: IR FLUORO GUIDE PORT INSERTION RIGHT: IMG5741

## 2017-03-24 HISTORY — PX: IR US GUIDE VASC ACCESS RIGHT: IMG2390

## 2017-03-24 LAB — CBC
HCT: 33.5 % — ABNORMAL LOW (ref 36.0–46.0)
HEMOGLOBIN: 10.9 g/dL — AB (ref 12.0–15.0)
MCH: 29.2 pg (ref 26.0–34.0)
MCHC: 32.5 g/dL (ref 30.0–36.0)
MCV: 89.8 fL (ref 78.0–100.0)
Platelets: 361 10*3/uL (ref 150–400)
RBC: 3.73 MIL/uL — AB (ref 3.87–5.11)
RDW: 18 % — ABNORMAL HIGH (ref 11.5–15.5)
WBC: 3.6 10*3/uL — AB (ref 4.0–10.5)

## 2017-03-24 LAB — PROTIME-INR
INR: 0.94
PROTHROMBIN TIME: 12.5 s (ref 11.4–15.2)

## 2017-03-24 MED ORDER — LIDOCAINE HCL 1 % IJ SOLN
INTRAMUSCULAR | Status: AC
  Filled 2017-03-24: qty 20

## 2017-03-24 MED ORDER — FLUMAZENIL 0.5 MG/5ML IV SOLN
INTRAVENOUS | Status: AC
  Filled 2017-03-24: qty 5

## 2017-03-24 MED ORDER — MIDAZOLAM HCL 2 MG/2ML IJ SOLN
INTRAMUSCULAR | Status: AC
  Filled 2017-03-24: qty 2

## 2017-03-24 MED ORDER — LIDOCAINE-EPINEPHRINE (PF) 2 %-1:200000 IJ SOLN
INTRAMUSCULAR | Status: AC | PRN
  Administered 2017-03-24: 10 mL

## 2017-03-24 MED ORDER — MIDAZOLAM HCL 2 MG/2ML IJ SOLN
INTRAMUSCULAR | Status: AC | PRN
  Administered 2017-03-24 (×3): 0.5 mg via INTRAVENOUS

## 2017-03-24 MED ORDER — CEFAZOLIN SODIUM-DEXTROSE 2-4 GM/100ML-% IV SOLN
INTRAVENOUS | Status: AC
  Filled 2017-03-24: qty 100

## 2017-03-24 MED ORDER — LIDOCAINE HCL 1 % IJ SOLN
INTRAMUSCULAR | Status: AC | PRN
  Administered 2017-03-24: 10 mL

## 2017-03-24 MED ORDER — HEPARIN SOD (PORK) LOCK FLUSH 100 UNIT/ML IV SOLN
INTRAVENOUS | Status: AC
  Filled 2017-03-24: qty 5

## 2017-03-24 MED ORDER — FENTANYL CITRATE (PF) 100 MCG/2ML IJ SOLN
INTRAMUSCULAR | Status: AC | PRN
  Administered 2017-03-24 (×3): 25 ug via INTRAVENOUS

## 2017-03-24 MED ORDER — SODIUM CHLORIDE 0.9 % IV SOLN
INTRAVENOUS | Status: DC
  Administered 2017-03-24: 12:00:00 via INTRAVENOUS

## 2017-03-24 MED ORDER — LIDOCAINE-EPINEPHRINE (PF) 2 %-1:200000 IJ SOLN
INTRAMUSCULAR | Status: AC
  Filled 2017-03-24: qty 20

## 2017-03-24 MED ORDER — NALOXONE HCL 0.4 MG/ML IJ SOLN
INTRAMUSCULAR | Status: AC
  Filled 2017-03-24: qty 1

## 2017-03-24 MED ORDER — CEFAZOLIN SODIUM-DEXTROSE 2-4 GM/100ML-% IV SOLN
2.0000 g | INTRAVENOUS | Status: AC
  Administered 2017-03-24: 2 g via INTRAVENOUS

## 2017-03-24 MED ORDER — FENTANYL CITRATE (PF) 100 MCG/2ML IJ SOLN
INTRAMUSCULAR | Status: AC
  Filled 2017-03-24: qty 2

## 2017-03-24 NOTE — Discharge Instructions (Signed)
Moderate Conscious Sedation, Adult, Care After °These instructions provide you with information about caring for yourself after your procedure. Your health care provider may also give you more specific instructions. Your treatment has been planned according to current medical practices, but problems sometimes occur. Call your health care provider if you have any problems or questions after your procedure. °What can I expect after the procedure? °After your procedure, it is common: °· To feel sleepy for several hours. °· To feel clumsy and have poor balance for several hours. °· To have poor judgment for several hours. °· To vomit if you eat too soon. ° °Follow these instructions at home: °For at least 24 hours after the procedure: ° °· Do not: °? Participate in activities where you could fall or become injured. °? Drive. °? Use heavy machinery. °? Drink alcohol. °? Take sleeping pills or medicines that cause drowsiness. °? Make important decisions or sign legal documents. °? Take care of children on your own. °· Rest. °Eating and drinking °· Follow the diet recommended by your health care provider. °· If you vomit: °? Drink water, juice, or soup when you can drink without vomiting. °? Make sure you have little or no nausea before eating solid foods. °General instructions °· Have a responsible adult stay with you until you are awake and alert. °· Take over-the-counter and prescription medicines only as told by your health care provider. °· If you smoke, do not smoke without supervision. °· Keep all follow-up visits as told by your health care provider. This is important. °Contact a health care provider if: °· You keep feeling nauseous or you keep vomiting. °· You feel light-headed. °· You develop a rash. °· You have a fever. °Get help right away if: °· You have trouble breathing. °This information is not intended to replace advice given to you by your health care provider. Make sure you discuss any questions you have  with your health care provider. °Document Released: 12/12/2012 Document Revised: 07/27/2015 Document Reviewed: 06/13/2015 °Elsevier Interactive Patient Education © 2018 [employer] ° ° °Implanted Port Insertion, Care After °This sheet gives you information about how to care for yourself after your procedure. Your health care provider may also give you more specific instructions. If you have problems or questions, contact your health care provider. °What can I expect after the procedure? °After your procedure, it is common to have: °· Discomfort at the port insertion site. °· Bruising on the skin over the port. This should improve over 3-4 days. ° °Follow these instructions at home: °Port care °· After your port is placed, you will get a manufacturer's information card. The card has information about your port. Keep this card with you at all times. °· Take care of the port as told by your health care provider. Ask your health care provider if you or a family member can get training for taking care of the port at home. A home health care nurse may also take care of the port. °· Make sure to remember what type of port you have. °Incision care °· Follow instructions from your health care provider about how to take care of your port insertion site. Make sure you: °? Wash your hands with soap and water before you change your bandage (dressing). If soap and water are not available, use hand sanitizer. °? Change your dressing as told by your health care provider.  You may remove your dressing tomorrow. °? Leave skin glue in place. These skin closures   may need to stay in place for 2 weeks or longer. If adhesive strip edges start to loosen and curl up, you may trim the loose edges. Do not remove adhesive strips completely unless your health care provider tells you to do that.  DO NOT use EMLA cream for 2 weeks after port placement as this cream will remove surgical glue. °· Check your port insertion site every day for signs  of infection. Check for: °? More redness, swelling, or pain. °? More fluid or blood. °? Warmth. °? Pus or a bad smell. °General instructions °· Do not take baths, swim, or use a hot tub until your health care provider approves.  You may shower tomorrow. °· Do not lift anything that is heavier than 10 lb (4.5 kg) for a week, or as told by your health care provider. °· Ask your health care provider when it is okay to: °? Return to work or school. °? Resume usual physical activities or sports. °· Do not drive for 24 hours if you were given a medicine to help you relax (sedative). °· Take over-the-counter and prescription medicines only as told by your health care provider. °· Wear a medical alert bracelet in case of an emergency. This will tell any health care providers that you have a port. °· Keep all follow-up visits as told by your health care provider. This is important. °Contact a health care provider if: °· You have a fever or chills. °· You have more redness, swelling, or pain around your port insertion site. °· You have more fluid or blood coming from your port insertion site. °· Your port insertion site feels warm to the touch. °· You have pus or a bad smell coming from the port insertion site. °Get help right away if: °· You have chest pain or shortness of breath. °· You have bleeding from your port that you cannot control. °Summary °· Take care of the port as told by your health care provider. °· Change your dressing as told by your health care provider. °· Keep all follow-up visits as told by your health care provider. °This information is not intended to replace advice given to you by your health care provider. Make sure you discuss any questions you have with your health care provider. °Document Released: 12/12/2012 Document Revised: 01/13/2016 Document Reviewed: 01/13/2016 °Elsevier Interactive Patient Education © 2017 [employer] ° °

## 2017-03-24 NOTE — Consult Note (Signed)
Chief Complaint: Patient was seen in consultation today for port a cath placement  Referring Physician(s): Feng,Yan  Supervising Physician: Markus Daft  Patient Status: [facility] - Out-pt  History of Present Illness: TIAJAH Wilkins is a 77 y.o. female with history of dementia and metastatic colon cancer, originally diagnosed in 2016, status post surgery and chemotherapy.  She has poor venous access and presents today for Port-A-Cath placement for additional palliative chemotherapy.  Past Medical History:  Diagnosis Date  . Anxiety   . Aortic atherosclerosis ([place]) 03/15/2017   noted on PET   . Arthritis   . Bilateral renal cysts   . Centrilobular emphysema ([place])   . Chronic low back pain   . CKD (chronic kidney disease), stage III ([place])   . Colon cancer [facility]) oncologist-  dr [name]   dx 01-23-2015  right colon Invasive adenocarcinoma into pericecal connective tissue and involving small intestine , Stage IIc, Grade 2, LVI(-),  (pT4b N0 M0) /  s/p resection termial ileum and cecum and chemotherapy 01-06-217 to 07-06-2015/   08/ 2017  per PET  recurrent w/ liver mets and peritoneal carcinomatosis  . Complication of anesthesia    can be combative due to dementia  . Full dentures   . History of gastroesophageal reflux (GERD)    changes diet  . History of pelvic fracture   . Hyperlipidemia   . Hypertension   . Liver metastasis ([place])   . 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 N&V (nausea and vomiting)   . Pulmonary nodules   . Right ovarian cyst   . SBO (small bowel obstruction) ([place]) 01/2015  . 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 UTI (urinary tract infection) 09/26/2016   5 day antibiotic treatment    Past Surgical History:  Procedure Laterality Date  . CATARACT EXTRACTION W/  INTRAOCULAR LENS  IMPLANT, BILATERAL  2005 approx  . CYSTOSCOPY W/ URETERAL STENT PLACEMENT Right 12/03/2015   Procedure: CYSTOSCOPY WITH RETROGRADE PYELOGRAM/URETERAL STENT PLACEMENT;  Surgeon: Cleon Gustin, MD;  Location: [facility];  Service: Urology;  Laterality: Right;  . CYSTOSCOPY W/ URETERAL STENT PLACEMENT Right 02/15/2016   Procedure: CYSTOSCOPY WITH RETROGRADE PYELOGRAM/URETERAL STENT REPLACEMENT;  Surgeon: Cleon Gustin, MD;  Location: [facility];  Service: Urology;  Laterality: Right;  . CYSTOSCOPY W/ URETERAL STENT PLACEMENT Right 03/28/2016   Procedure: CYSTOSCOPY WITH RETROGRADE PYELOGRAM/URETERAL STENT EXCHANGE;  Surgeon: Cleon Gustin, MD;  Location: [facility];  Service: Urology;  Laterality: Right;  . CYSTOSCOPY W/ URETERAL STENT PLACEMENT Right 07/18/2016   Procedure: CYSTOSCOPY WITH RETROGRADE PYELOGRAM/ STENT EXCHANGE;  Surgeon: Cleon Gustin, MD;  Location[facility];  Service: Urology;  Laterality: Right;  . CYSTOSCOPY W/ URETERAL STENT PLACEMENT Right 10/10/2016   Procedure: CYSTOSCOPY WITH RETROGRADE PYELOGRAM/URETERAL STENT REPLACEMENT;  Surgeon: Cleon Gustin, MD;  Location: [facility];  Service: Urology;  Laterality: Right;  . CYSTOSCOPY WITH RETROGRADE PYELOGRAM, URETEROSCOPY AND STENT PLACEMENT Right 09/21/2015   Procedure: CYSTOSCOPY WITH BIL RETROGRADE PYELOGRAM, URETEROSCOPY, RIGHT STENT PLACEMENT ;  Surgeon: Cleon Gustin, MD;  Location: [facility];  Service: Urology;  Laterality: Right;  . HEMICOLECTOMY Right 01/23/2015   Dr. Hassell Done  . LAPAROSCOPY N/A 01/23/2015   Procedure: DIAGNOSTIC LAPAROSCOPY, EXPLORATORY LAPAROTOMY  RIGHT HEMI-COLECTOMY FOR OBSTRUCTION OF RIGHT TERMINAL ILEUM;  Surgeon: [name], MD;  Location: [facility];  Service: General;  Laterality: N/A;    Allergies: Namenda [memantine hcl] and  Oxycodone  Medications: Prior to Admission medications   Medication Sig Start Date End Date Taking? Authorizing Provider  amLODipine (NORVASC) 5 MG tablet Take 5 mg by mouth every morning.    Yes [provider]  Cholecalciferol (VITAMIN D3) 2000 UNITS TABS Take by mouth daily.   Yes [provider]  Coenzyme Q10 (CO Q 10) 100 MG CAPS Take by mouth daily.   Yes [provider]  Meth-Hyo-M Bl-Na Phos-Ph Sal (URIBEL) 118 MG CAPS Take 1 capsule (118 mg total) by mouth 2 (two) times daily as needed. 10/10/16  Yes McKenzie, [name], MD  mirtazapine (REMERON) 15 MG tablet TAKE 1 TABLET BY MOUTH AT BEDTIME 01/18/17  Yes Cameron Sprang, MD  Multiple Vitamins-Minerals (OCUVITE-LUTEIN PO) Take 1 tablet by mouth daily.   Yes [provider]  Multiple Vitamins-Minerals (WOMENS MULTIVITAMIN PO) Take 1 tablet by mouth daily.   Yes [provider]  MYRBETRIQ 25 MG TB24 tablet  10/26/16  Yes [provider]  polyethylene glycol (MIRALAX / GLYCOLAX) packet Take 17 g by mouth daily. Patient taking differently: Take 17 g by mouth daily as needed.  01/30/15  Yes Verlee Monte, MD  potassium chloride SA (K-DUR,KLOR-CON) 20 MEQ tablet Take 1 tablet (20 mEq total) by mouth daily. 03/20/17  Yes [name], MD  prochlorperazine (COMPAZINE) 5 MG tablet Take 1 tablet (5 mg total) by mouth every 6 (six) hours as needed for nausea or vomiting. 06/03/16  Yes Owens Shark, NP  QUEtiapine (SEROQUEL) 25 MG tablet Take 1 tablet (25 mg total) by mouth at bedtime. Take 1/2 tablet at night for 1 week, then increase to 1 tablet at night 01/24/17  Yes Cameron Sprang, MD  traMADol (ULTRAM) 50 MG tablet Take 1 tablet (50 mg total) by mouth every 6 (six) hours as needed. 10/10/16 10/10/17 Yes McKenzie, [name], MD  UNABLE TO FIND Med Name: Closys Mouth Rinse two-three times daily for ulcer.   Yes [provider]  vitamin B-12 (CYANOCOBALAMIN) 1000 MCG tablet Take 1,000 mcg by  mouth daily.   Yes [provider]  Lutein-Zeaxanthin 25-5 MG CAPS Take by mouth.    [provider]  Meth-Hyo-M Bl-Na Phos-Ph Sal (URO-MP) 118 MG CAPS  10/18/16   [provider]  mirabegron ER (MYRBETRIQ) 50 MG TB24 tablet Take 50 mg by mouth daily.    [provider]     Family History  Problem Relation Age of Onset  . Diabetes Mellitus II Father   . Cancer Mother 15       ovarian cancer   . Cervical cancer Other     Social History   Socioeconomic History  . Marital status: Single    Spouse name: None  . Number of children: None  . Years of education: None  . Highest education level: None  Social Needs  . Financial resource strain: None  . Food insecurity - worry: None  . Food insecurity - inability: None  . Transportation needs - medical: None  . Transportation needs - non-medical: None  Occupational History  . None  Tobacco Use  . Smoking status: Former Smoker    Packs/day: 0.25    Years: 50.00    Pack years: 12.50    Types: Cigarettes    Last attempt to  quit: 01/25/2015    Years since quitting: 2.1  . Smokeless tobacco: Never Used  Substance and Sexual Activity  . Alcohol use: Yes    Alcohol/week: 1.8 oz    Types: 2 Glasses of wine, 1 Cans of beer per week    Comment: occasional   . Drug use: No  . Sexual activity: None  Other Topics Concern  . None  Social History Narrative   Legally separated   Lives w/daughter, Laynee Lockamy   Has total of #2 daughters and #1 son   Dementia   Fall Risk-ambulates w/cane      Review of Systems currently denies fever, headache, chest pain, dyspnea, cough, back pain, nausea, vomiting or bleeding.  She does have intermittent abdominal pain.  Vital Signs: BP 135/87 (BP Location: Right Arm)   Pulse 88   Temp 97.9 F (36.6 C) (Oral)   Resp 18   SpO2 100%   Physical Exam awake, history of dementia noted.  Chest with clear but distant breath sounds bilaterally.  Heart with  regular rate and rhythm.  Abdomen soft, positive bowel sounds, nontender.  No lower extremity edema.  Imaging: Nm Pet Image Restag (ps) Skull Base To Thigh  Result Date: 03/13/2017 CLINICAL DATA:  Subsequent treatment strategy for colon cancer. EXAM: NUCLEAR MEDICINE PET SKULL BASE TO THIGH TECHNIQUE: 5.8 mCi F-18 FDG was injected intravenously. Full-ring PET imaging was performed from the skull base to thigh after the radiotracer. CT data was obtained and used for attenuation correction and anatomic localization. FASTING BLOOD GLUCOSE:  Value: 97 mg/dl COMPARISON:  11/25/2016. FINDINGS: NECK: No hypermetabolic lymph nodes in the neck. CT images show no acute findings. CHEST: No hypermetabolic mediastinal, hilar or axillary lymph nodes. There are multiple scattered mid and lower lung zone predominant pulmonary nodules measuring up to 5 mm in the anterior segment right upper lobe (series 8, image 45), stable but too small for PET resolution. Atherosclerotic calcification of the arterial vasculature, including coronary arteries. Heart is at the upper limits of normal in size. Small amount of pericardial fluid is likely physiologic. Moderate centrilobular emphysema. No pleural effusion. ABDOMEN/PELVIS: Hypermetabolic left hepatic lobe lesion measures 1.3 cm with an SUV max of 6.8, stable. Right hepatic lobe lesion measures 1.5 cm, stable, with an SUV max of 7.1 compared to 9.7 previously. Right paramidline pelvic omental nodule measures 11 mm in short axis, stable, but does not show abnormal hypermetabolism (previously SUV max 3.7. Focal hypermetabolism in the inferior left rectus abdominus muscle has an SUV max of 5.0, compared to 8.2 previously. A definite CT correlate is difficult to measure. No abnormal hypermetabolism in the adrenal glands, spleen or pancreas. No hypermetabolic lymph nodes. Additional well-circumscribed low-attenuation lesions in the liver measure up to 3.8 cm, stable and likely cysts.  Gallbladder adrenal glands are grossly unremarkable. Double-J right ureteral stent in place. Low-attenuation lesions in both kidneys measure up to 4.3 cm on the right, as before, difficult to further characterize without post-contrast imaging. Spleen, pancreas, stomach and bowel are otherwise grossly unremarkable. Atherosclerotic calcification of the arterial vasculature. Heterogeneous lesion in the uterus measures 5.3 cm, similar and likely a fibroid. No free fluid. SKELETON: No abnormal osseous hypermetabolism. Degenerative changes in the spine. IMPRESSION: 1. Hepatic metastases are stable in size with stable to minimally decreased hypermetabolism. 2. Inferior left rectus abdominus metastasis shows mildly decreased hypermetabolism. 3. Omental nodule no longer shows abnormal hypermetabolism. 4. Scattered pulmonary nodules are stable in size and too small for PET resolution. 5.  Aortic atherosclerosis (ICD10-170.0). 6.  Emphysema (ICD10-J43.9). Electronically Signed   By: Lorin Picket M.D.   On: 03/13/2017 10:57    Labs:  CBC: Recent Labs    02/17/17 0835 03/03/17 0945 03/17/17 0845 03/24/17 1140  WBC 7.9 6.5 6.2 3.6*  HGB 11.8 11.5* 11.8 10.9*  HCT 36.4 35.0 36.0 33.5*  PLT 313 325 358 361    COAGS: No results for input(s): INR, APTT in the last 8760 hours.  BMP: Recent Labs    02/03/17 1234 02/17/17 0835 03/03/17 0944 03/17/17 0845  NA 138 144 142 141  K 3.7 3.3* 3.7 3.1*  CL  --   --   --  108  CO2 21* 21* 23 21*  GLUCOSE 84 60* 61* 88  BUN 14.5 20.3 13.1 11  CALCIUM 9.1 9.2 9.4 9.1  CREATININE 1.2* 1.2* 1.3* 1.21*  GFRNONAA  --   --   --  43*  GFRAA  --   --   --  49*    LIVER FUNCTION TESTS: Recent Labs    02/03/17 1234 02/17/17 0835 03/03/17 0944 03/17/17 0845  BILITOT 0.53 0.32 0.33 0.5  AST 18 18 16 18   ALT 10 11 7 11   ALKPHOS 71 78 80 80  PROT 7.4 7.2 7.6 7.7  ALBUMIN 3.4* 3.5 3.5 3.6    TUMOR MARKERS: No results for input(s): AFPTM, CEA, CA199,  CHROMGRNA in the last 8760 hours.  Assessment and Plan: 77 y.o. female with history of dementia and metastatic colon cancer, originally diagnosed in 2016, status post surgery and chemotherapy.  She has poor venous access and presents today for Port-A-Cath placement for additional palliative chemotherapy.Risks and benefits discussed with the patient's daughter  including, but not limited to bleeding, infection, pneumothorax, or fibrin sheath development and need for additional procedures. All of the patient's daughter's questions were answered, patient is agreeable to proceed. Consent signed and in chart.       Thank you for this interesting consult.  I greatly enjoyed meeting MARILLYN GOREN and look forward to participating in their care.  A copy of this report was sent to the requesting provider on this date.  Electronically Signed: D. Rowe Robert, PA-C 03/24/2017, 12:44 PM   I spent a total of  25 minutes   in face to face in clinical consultation, greater than 50% of which was counseling/coordinating care for port a cath placement

## 2017-03-24 NOTE — Sedation Documentation (Signed)
Patient denies pain and is resting comfortably.  

## 2017-03-24 NOTE — Procedures (Signed)
Placement of right jugular port.  Tip in lower SVC.  Minimal blood loss and no immediate complication.   

## 2017-03-30 ENCOUNTER — Encounter (HOSPITAL_BASED_OUTPATIENT_CLINIC_OR_DEPARTMENT_OTHER): Payer: Self-pay | Admitting: *Deleted

## 2017-03-30 ENCOUNTER — Other Ambulatory Visit: Payer: Self-pay

## 2017-03-30 NOTE — Progress Notes (Signed)
Npo after midnight food, clear liquids midnight until 900 am day of surgery, then npo after 900 am, take amlodipine, mybetriq, uribel prn, daughter wishes for patient to take potassium day of surgery and will break in 1/2 due to recent potassium being low, spoke with dr Smith Robert anesthesia and reviewed chest ct 05-19-16 results and patient not having any respiratory issues per daughter patient ok for surgery at [facility] per dr Smith Robert anestheisa. ekg 10-10-16 epic/chart, chest ct 05-19-16 epic/chart, medical history and instructions reviewed with daughter/health care poa due to dementia vanessa Prete who will be driver and sign consent day of surgery. Needs I stat 4

## 2017-03-31 ENCOUNTER — Other Ambulatory Visit: Payer: Self-pay | Admitting: Hematology

## 2017-03-31 ENCOUNTER — Inpatient Hospital Stay: Payer: Medicare Other

## 2017-03-31 ENCOUNTER — Other Ambulatory Visit: Payer: Self-pay

## 2017-03-31 VITALS — BP 145/91 | HR 85 | Temp 97.6°F | Resp 16

## 2017-03-31 DIAGNOSIS — C182 Malignant neoplasm of ascending colon: Secondary | ICD-10-CM

## 2017-03-31 DIAGNOSIS — Z5112 Encounter for antineoplastic immunotherapy: Secondary | ICD-10-CM | POA: Diagnosis not present

## 2017-03-31 LAB — CBC WITH DIFFERENTIAL/PLATELET
BASOS PCT: 1 %
Basophils Absolute: 0.1 10*3/uL (ref 0.0–0.1)
EOS ABS: 0.2 10*3/uL (ref 0.0–0.5)
Eosinophils Relative: 4 %
HCT: 35.2 % (ref 34.8–46.6)
HEMOGLOBIN: 11.4 g/dL — AB (ref 11.6–15.9)
LYMPHS ABS: 1.3 10*3/uL (ref 0.9–3.3)
Lymphocytes Relative: 24 %
MCH: 29.2 pg (ref 25.1–34.0)
MCHC: 32.6 g/dL (ref 31.5–36.0)
MCV: 89.6 fL (ref 79.5–101.0)
MONOS PCT: 11 %
Monocytes Absolute: 0.6 10*3/uL (ref 0.1–0.9)
NEUTROS PCT: 60 %
Neutro Abs: 3.3 10*3/uL (ref 1.5–6.5)
Platelets: 299 10*3/uL (ref 145–400)
RBC: 3.92 MIL/uL (ref 3.70–5.45)
RDW: 20 % — AB (ref 11.2–16.1)
WBC: 5.5 10*3/uL (ref 3.9–10.3)

## 2017-03-31 LAB — COMPREHENSIVE METABOLIC PANEL
ALK PHOS: 76 U/L (ref 40–150)
ALT: 7 U/L (ref 0–55)
AST: 17 U/L (ref 5–34)
Albumin: 3.5 g/dL (ref 3.5–5.0)
Anion gap: 8 (ref 3–11)
BUN: 15 mg/dL (ref 7–26)
CALCIUM: 9.5 mg/dL (ref 8.4–10.4)
CHLORIDE: 108 mmol/L (ref 98–109)
CO2: 25 mmol/L (ref 22–29)
CREATININE: 1.12 mg/dL — AB (ref 0.60–1.10)
GFR calc non Af Amer: 47 mL/min — ABNORMAL LOW (ref >60)
GFR, EST AFRICAN AMERICAN: 54 mL/min — AB (ref >60)
GLUCOSE: 76 mg/dL (ref 70–140)
Potassium: 4.8 mmol/L — ABNORMAL HIGH (ref 3.3–4.7)
SODIUM: 141 mmol/L (ref 136–145)
Total Bilirubin: 0.4 mg/dL (ref 0.2–1.2)
Total Protein: 7.2 g/dL (ref 6.4–8.3)

## 2017-03-31 LAB — UA PROTEIN, DIPSTICK - CHCC: PROTEIN: 100 mg/dL — AB

## 2017-03-31 MED ORDER — SODIUM CHLORIDE 0.9 % IV SOLN
Freq: Once | INTRAVENOUS | Status: AC
  Administered 2017-03-31: 11:00:00 via INTRAVENOUS

## 2017-03-31 MED ORDER — DEXAMETHASONE SODIUM PHOSPHATE 10 MG/ML IJ SOLN
10.0000 mg | Freq: Once | INTRAMUSCULAR | Status: AC
  Administered 2017-03-31: 10 mg via INTRAVENOUS

## 2017-03-31 MED ORDER — SODIUM CHLORIDE 0.9 % IV SOLN
5.0000 mg/kg | Freq: Once | INTRAVENOUS | Status: AC
  Administered 2017-03-31: 275 mg via INTRAVENOUS
  Filled 2017-03-31: qty 11

## 2017-03-31 MED ORDER — ATROPINE SULFATE 1 MG/ML IJ SOLN
0.5000 mg | Freq: Once | INTRAMUSCULAR | Status: AC | PRN
  Administered 2017-03-31: 0.5 mg via INTRAVENOUS

## 2017-03-31 MED ORDER — LIDOCAINE-PRILOCAINE 2.5-2.5 % EX CREA: 1.0000 "application " | TOPICAL_CREAM | CUTANEOUS | 5 refills | Status: AC | PRN

## 2017-03-31 MED ORDER — DEXAMETHASONE SODIUM PHOSPHATE 10 MG/ML IJ SOLN
INTRAMUSCULAR | Status: AC
  Filled 2017-03-31: qty 1

## 2017-03-31 MED ORDER — SODIUM CHLORIDE 0.9% FLUSH
10.0000 mL | INTRAVENOUS | Status: DC | PRN
  Administered 2017-03-31: 10 mL
  Filled 2017-03-31: qty 10

## 2017-03-31 MED ORDER — PALONOSETRON HCL INJECTION 0.25 MG/5ML
INTRAVENOUS | Status: AC
  Filled 2017-03-31: qty 5

## 2017-03-31 MED ORDER — ATROPINE SULFATE 1 MG/ML IJ SOLN
INTRAMUSCULAR | Status: AC
  Filled 2017-03-31: qty 1

## 2017-03-31 MED ORDER — PALONOSETRON HCL INJECTION 0.25 MG/5ML
0.2500 mg | Freq: Once | INTRAVENOUS | Status: AC
  Administered 2017-03-31: 0.25 mg via INTRAVENOUS

## 2017-03-31 MED ORDER — HEPARIN SOD (PORK) LOCK FLUSH 100 UNIT/ML IV SOLN
500.0000 [IU] | Freq: Once | INTRAVENOUS | Status: AC | PRN
  Administered 2017-03-31: 500 [IU]
  Filled 2017-03-31: qty 5

## 2017-03-31 MED ORDER — IRINOTECAN HCL CHEMO INJECTION 100 MG/5ML
150.0000 mg/m2 | Freq: Once | INTRAVENOUS | Status: AC
  Administered 2017-03-31: 240 mg via INTRAVENOUS
  Filled 2017-03-31: qty 12

## 2017-03-31 MED FILL — LIDOCAINE-PRILOCAINE CREAM: 2.5-2.5 | 30 days supply | Qty: 30 | Fill #0

## 2017-03-31 NOTE — Progress Notes (Signed)
Patients potassium today is 4.8. MD aware. Verbalized to patient family member to stop taking potassium tablets. Urine protein has improved and pt will receive avastin today.  Port placed last week, does not have order for EMLA cream. Sent to [facility] today. Daughter aware.  Cyndia Bent RN

## 2017-03-31 NOTE — Patient Instructions (Addendum)
Cancer Center Discharge Instructions for Patients Receiving Chemotherapy  Today you received the following chemotherapy agents :  Irinotecan,  Avastin.  To help prevent nausea and vomiting after your treatment, we encourage you to take your nausea medication as prescribed.   If you develop nausea and vomiting that is not controlled by your nausea medication, call the clinic.   BELOW ARE SYMPTOMS THAT SHOULD BE REPORTED IMMEDIATELY:  *FEVER GREATER THAN 100.5 F  *CHILLS WITH OR WITHOUT FEVER  NAUSEA AND VOMITING THAT IS NOT CONTROLLED WITH YOUR NAUSEA MEDICATION  *UNUSUAL SHORTNESS OF BREATH  *UNUSUAL BRUISING OR BLEEDING  TENDERNESS IN MOUTH AND THROAT WITH OR WITHOUT PRESENCE OF ULCERS  *URINARY PROBLEMS  *BOWEL PROBLEMS  UNUSUAL RASH Items with * indicate a potential emergency and should be followed up as soon as possible.  Feel free to call the clinic should you have any questions or concerns. The clinic phone number is [phone].  Please show the CHEMO ALERT CARD at check-in to the Emergency Department and triage nurse.   

## 2017-04-01 LAB — CEA: CEA1: 12.7 ng/mL — AB (ref 0.0–4.7)

## 2017-04-05 MED FILL — AMLODIPINE BESYLATE 5 MG TA: 5 | 30 days supply | Qty: 30 | Fill #5

## 2017-04-10 ENCOUNTER — Other Ambulatory Visit: Payer: Self-pay

## 2017-04-10 ENCOUNTER — Ambulatory Visit (HOSPITAL_BASED_OUTPATIENT_CLINIC_OR_DEPARTMENT_OTHER): Payer: Medicare Other | Admitting: Anesthesiology

## 2017-04-10 ENCOUNTER — Encounter (HOSPITAL_BASED_OUTPATIENT_CLINIC_OR_DEPARTMENT_OTHER)
Admission: RE | Disposition: A | Discharge status: Home / Self Care | Payer: Self-pay | Source: Ambulatory Visit | Attending: Urology

## 2017-04-10 ENCOUNTER — Ambulatory Visit (HOSPITAL_BASED_OUTPATIENT_CLINIC_OR_DEPARTMENT_OTHER)
Admission: RE | Admit: 2017-04-10 | Discharge: 2017-04-10 | Disposition: A | Discharge status: Home / Self Care | Payer: Medicare Other | Source: Ambulatory Visit | Attending: Urology | Admitting: Urology

## 2017-04-10 ENCOUNTER — Encounter (HOSPITAL_BASED_OUTPATIENT_CLINIC_OR_DEPARTMENT_OTHER): Payer: Self-pay | Admitting: *Deleted

## 2017-04-10 DIAGNOSIS — N135 Crossing vessel and stricture of ureter without hydronephrosis: Secondary | ICD-10-CM | POA: Insufficient documentation

## 2017-04-10 DIAGNOSIS — F419 Anxiety disorder, unspecified: Secondary | ICD-10-CM | POA: Insufficient documentation

## 2017-04-10 DIAGNOSIS — Z9889 Other specified postprocedural states: Secondary | ICD-10-CM | POA: Diagnosis not present

## 2017-04-10 DIAGNOSIS — I739 Peripheral vascular disease, unspecified: Secondary | ICD-10-CM | POA: Insufficient documentation

## 2017-04-10 DIAGNOSIS — N183 Chronic kidney disease, stage 3 (moderate): Secondary | ICD-10-CM | POA: Diagnosis not present

## 2017-04-10 DIAGNOSIS — Z466 Encounter for fitting and adjustment of urinary device: Secondary | ICD-10-CM | POA: Diagnosis not present

## 2017-04-10 DIAGNOSIS — Z85038 Personal history of other malignant neoplasm of large intestine: Secondary | ICD-10-CM | POA: Insufficient documentation

## 2017-04-10 DIAGNOSIS — C786 Secondary malignant neoplasm of retroperitoneum and peritoneum: Secondary | ICD-10-CM | POA: Diagnosis not present

## 2017-04-10 DIAGNOSIS — Z9221 Personal history of antineoplastic chemotherapy: Secondary | ICD-10-CM | POA: Diagnosis not present

## 2017-04-10 DIAGNOSIS — Z7982 Long term (current) use of aspirin: Secondary | ICD-10-CM | POA: Diagnosis not present

## 2017-04-10 DIAGNOSIS — F0390 Unspecified dementia without behavioral disturbance: Secondary | ICD-10-CM | POA: Diagnosis not present

## 2017-04-10 DIAGNOSIS — Z87891 Personal history of nicotine dependence: Secondary | ICD-10-CM | POA: Insufficient documentation

## 2017-04-10 DIAGNOSIS — E785 Hyperlipidemia, unspecified: Secondary | ICD-10-CM | POA: Insufficient documentation

## 2017-04-10 DIAGNOSIS — C787 Secondary malignant neoplasm of liver and intrahepatic bile duct: Secondary | ICD-10-CM | POA: Diagnosis not present

## 2017-04-10 DIAGNOSIS — Z888 Allergy status to other drugs, medicaments and biological substances status: Secondary | ICD-10-CM | POA: Diagnosis not present

## 2017-04-10 DIAGNOSIS — Z79899 Other long term (current) drug therapy: Secondary | ICD-10-CM | POA: Insufficient documentation

## 2017-04-10 DIAGNOSIS — M1990 Unspecified osteoarthritis, unspecified site: Secondary | ICD-10-CM | POA: Insufficient documentation

## 2017-04-10 DIAGNOSIS — Z885 Allergy status to narcotic agent status: Secondary | ICD-10-CM | POA: Diagnosis not present

## 2017-04-10 DIAGNOSIS — K219 Gastro-esophageal reflux disease without esophagitis: Secondary | ICD-10-CM | POA: Insufficient documentation

## 2017-04-10 DIAGNOSIS — I700 Atherosclerosis of aorta: Secondary | ICD-10-CM | POA: Insufficient documentation

## 2017-04-10 DIAGNOSIS — R3915 Urgency of urination: Secondary | ICD-10-CM | POA: Diagnosis not present

## 2017-04-10 DIAGNOSIS — I129 Hypertensive chronic kidney disease with stage 1 through stage 4 chronic kidney disease, or unspecified chronic kidney disease: Secondary | ICD-10-CM | POA: Diagnosis not present

## 2017-04-10 DIAGNOSIS — J432 Centrilobular emphysema: Secondary | ICD-10-CM | POA: Diagnosis not present

## 2017-04-10 DIAGNOSIS — Z9049 Acquired absence of other specified parts of digestive tract: Secondary | ICD-10-CM | POA: Insufficient documentation

## 2017-04-10 HISTORY — PX: CYSTOSCOPY W/ URETERAL STENT PLACEMENT: SHX1429

## 2017-04-10 HISTORY — DX: Headache, unspecified: R51.9

## 2017-04-10 HISTORY — DX: Atherosclerosis of aorta: I70.0

## 2017-04-10 HISTORY — DX: Personal history of antineoplastic chemotherapy: Z92.21

## 2017-04-10 HISTORY — DX: Headache: R51

## 2017-04-10 HISTORY — DX: Unspecified intestinal obstruction, unspecified as to partial versus complete obstruction: K56.609

## 2017-04-10 HISTORY — DX: Nausea with vomiting, unspecified: R11.2

## 2017-04-10 LAB — POCT I-STAT 4, (NA,K, GLUC, HGB,HCT)
Glucose, Bld: 87 mg/dL (ref 65–99)
HEMATOCRIT: 37 % (ref 36.0–46.0)
Hemoglobin: 12.6 g/dL (ref 12.0–15.0)
POTASSIUM: 4.3 mmol/L (ref 3.5–5.1)
Sodium: 143 mmol/L (ref 135–145)

## 2017-04-10 SURGERY — CYSTOSCOPY, WITH RETROGRADE PYELOGRAM AND URETERAL STENT INSERTION
Anesthesia: General | Laterality: Right

## 2017-04-10 MED ORDER — CEFAZOLIN SODIUM-DEXTROSE 2-4 GM/100ML-% IV SOLN
INTRAVENOUS | Status: AC
  Filled 2017-04-10: qty 100

## 2017-04-10 MED ORDER — PROPOFOL 10 MG/ML IV BOLUS
INTRAVENOUS | Status: AC
  Filled 2017-04-10: qty 20

## 2017-04-10 MED ORDER — ONDANSETRON HCL 4 MG/2ML IJ SOLN
INTRAMUSCULAR | Status: AC
  Filled 2017-04-10: qty 2

## 2017-04-10 MED ORDER — MEPERIDINE HCL 25 MG/ML IJ SOLN
6.2500 mg | INTRAMUSCULAR | Status: DC | PRN
  Filled 2017-04-10: qty 1

## 2017-04-10 MED ORDER — FENTANYL CITRATE (PF) 100 MCG/2ML IJ SOLN
25.0000 ug | INTRAMUSCULAR | Status: DC | PRN
  Filled 2017-04-10: qty 1

## 2017-04-10 MED ORDER — LIDOCAINE 2% (20 MG/ML) 5 ML SYRINGE
INTRAMUSCULAR | Status: DC | PRN
  Administered 2017-04-10: 30 mg via INTRAVENOUS

## 2017-04-10 MED ORDER — ONDANSETRON HCL 4 MG/2ML IJ SOLN
4.0000 mg | Freq: Once | INTRAMUSCULAR | Status: DC | PRN
  Filled 2017-04-10: qty 2

## 2017-04-10 MED ORDER — FENTANYL CITRATE (PF) 100 MCG/2ML IJ SOLN
INTRAMUSCULAR | Status: AC
  Filled 2017-04-10: qty 2

## 2017-04-10 MED ORDER — PROPOFOL 10 MG/ML IV BOLUS
INTRAVENOUS | Status: DC | PRN
  Administered 2017-04-10: 150 mg via INTRAVENOUS

## 2017-04-10 MED ORDER — DEXAMETHASONE SODIUM PHOSPHATE 10 MG/ML IJ SOLN
INTRAMUSCULAR | Status: DC | PRN
  Administered 2017-04-10: 10 mg via INTRAVENOUS

## 2017-04-10 MED ORDER — CEFAZOLIN SODIUM-DEXTROSE 2-4 GM/100ML-% IV SOLN
2.0000 g | INTRAVENOUS | Status: AC
  Administered 2017-04-10: 2 g via INTRAVENOUS
  Filled 2017-04-10: qty 100

## 2017-04-10 MED ORDER — SODIUM CHLORIDE 0.9 % IV SOLN
INTRAVENOUS | Status: DC
  Administered 2017-04-10: 14:00:00 via INTRAVENOUS
  Filled 2017-04-10: qty 1000

## 2017-04-10 MED ORDER — LIDOCAINE 2% (20 MG/ML) 5 ML SYRINGE
INTRAMUSCULAR | Status: AC
  Filled 2017-04-10: qty 5

## 2017-04-10 MED ORDER — ONDANSETRON HCL 4 MG/2ML IJ SOLN
INTRAMUSCULAR | Status: DC | PRN
  Administered 2017-04-10: 4 mg via INTRAVENOUS

## 2017-04-10 MED ORDER — ACETAMINOPHEN 325 MG PO TABS
325.0000 mg | ORAL_TABLET | ORAL | Status: DC | PRN
  Filled 2017-04-10: qty 2

## 2017-04-10 MED ORDER — ACETAMINOPHEN 160 MG/5ML PO SOLN
325.0000 mg | ORAL | Status: DC | PRN
  Filled 2017-04-10: qty 20.3

## 2017-04-10 MED ORDER — MIRABEGRON ER 50 MG PO TB24: 50.0000 mg | ORAL_TABLET | Freq: Every day | ORAL | 11 refills | Status: AC

## 2017-04-10 MED ORDER — DEXAMETHASONE SODIUM PHOSPHATE 10 MG/ML IJ SOLN
INTRAMUSCULAR | Status: AC
  Filled 2017-04-10: qty 1

## 2017-04-10 MED ORDER — TRAMADOL HCL 50 MG PO TABS: 50.0000 mg | ORAL_TABLET | Freq: Four times a day (QID) | ORAL | 0 refills | Status: DC | PRN

## 2017-04-10 SURGICAL SUPPLY — 18 items
BAG DRAIN URO-CYSTO SKYTR STRL (DRAIN) ×6 IMPLANT
CATH INTERMIT  6FR 70CM (CATHETERS) ×3 IMPLANT
CLOTH BEACON ORANGE TIMEOUT ST (SAFETY) ×3 IMPLANT
GLOVE BIO SURGEON STRL SZ8 (GLOVE) ×3 IMPLANT
GOWN STRL REUS W/TWL LRG LVL3 (GOWN DISPOSABLE) ×3 IMPLANT
GOWN STRL REUS W/TWL XL LVL3 (GOWN DISPOSABLE) ×3 IMPLANT
GUIDEWIRE ANG ZIPWIRE 038X150 (WIRE) ×3 IMPLANT
GUIDEWIRE STR DUAL SENSOR (WIRE) IMPLANT
INFUSOR MANOMETER BAG 3000ML (MISCELLANEOUS) ×3 IMPLANT
IV NS IRRIG 3000ML ARTHROMATIC (IV SOLUTION) ×3 IMPLANT
KIT RM TURNOVER CYSTO AR (KITS) ×3 IMPLANT
MANIFOLD NEPTUNE II (INSTRUMENTS) IMPLANT
NS IRRIG 500ML POUR BTL (IV SOLUTION) ×3 IMPLANT
PACK CYSTO (CUSTOM PROCEDURE TRAY) ×3 IMPLANT
STENT POLARIS LOOP 8FR X 24 CM (STENTS) ×3 IMPLANT
SYRINGE 10CC LL (SYRINGE) ×3 IMPLANT
TUBE CONNECTING 12'X1/4 (SUCTIONS) ×1
TUBE CONNECTING 12X1/4 (SUCTIONS) ×2 IMPLANT

## 2017-04-10 NOTE — H&P (Signed)
Urology Admission H&P  Chief Complaint: right hydronephrosis  History of Present Illness: Beth Wilkins is a 77yo with a hx of metastatic colon cancer with malignant right hydronephrosis who is here for stent exchange. She has urinary urgency which is well controlled with mirabegron 50mg . No new LUTS. No flank pain.   Past Medical History:  Diagnosis Date  . Anxiety   . Aortic atherosclerosis ([place]) 03/15/2017   noted on PET   . Arthritis   . Bilateral renal cysts   . Centrilobular emphysema ([place]) per 05-10-16 chest xray  . CKD (chronic kidney disease), stage III ([place])   . 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 Complication of anesthesia    can be combative due to dementia  . Full dentures   . Headache    occ stress related  . History of chemotherapy    every other friday currently receiving  . History of gastroesophageal reflux (GERD)    changes diet  . History of pelvic fracture 2015  . Hyperlipidemia   . Hypertension   . Liver metastasis ([place])   . 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 N&V (nausea and vomiting)   . Pulmonary nodules   . Right ovarian cyst   . SBO (small bowel obstruction) ([place]) 01/2015  . 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 UTI (urinary tract infection) 09/26/2016   5 day antibiotic treatment   Past Surgical History:  Procedure Laterality Date  . CATARACT EXTRACTION W/ INTRAOCULAR LENS  IMPLANT, BILATERAL  2005 approx  . CYSTOSCOPY W/ URETERAL  STENT PLACEMENT Right 12/03/2015   Procedure: CYSTOSCOPY WITH RETROGRADE PYELOGRAM/URETERAL STENT PLACEMENT;  Surgeon: Cleon Gustin, MD;  Location: [facility];  Service: Urology;  Laterality: Right;  . CYSTOSCOPY W/ URETERAL STENT PLACEMENT Right 02/15/2016   Procedure: CYSTOSCOPY WITH RETROGRADE PYELOGRAM/URETERAL STENT REPLACEMENT;  Surgeon: Cleon Gustin, MD;  Location: [facility];  Service: Urology;  Laterality: Right;  . CYSTOSCOPY W/ URETERAL STENT PLACEMENT Right 03/28/2016   Procedure: CYSTOSCOPY WITH RETROGRADE PYELOGRAM/URETERAL STENT EXCHANGE;  Surgeon: Cleon Gustin, MD;  Location: [facility];  Service: Urology;  Laterality: Right;  . CYSTOSCOPY W/ URETERAL STENT PLACEMENT Right 07/18/2016   Procedure: CYSTOSCOPY WITH RETROGRADE PYELOGRAM/ STENT EXCHANGE;  Surgeon: Cleon Gustin, MD;  Location: [facility];  Service: Urology;  Laterality: Right;  . CYSTOSCOPY W/ URETERAL STENT PLACEMENT Right 10/10/2016   Procedure: CYSTOSCOPY WITH RETROGRADE PYELOGRAM/URETERAL STENT REPLACEMENT;  Surgeon: Cleon Gustin, MD;  Location: [facility];  Service: Urology;  Laterality: Right;  . CYSTOSCOPY WITH RETROGRADE PYELOGRAM, URETEROSCOPY AND STENT PLACEMENT Right 09/21/2015   Procedure: CYSTOSCOPY WITH BIL RETROGRADE PYELOGRAM, URETEROSCOPY, RIGHT STENT PLACEMENT ;  Surgeon: Cleon Gustin, MD;  Location: [facility];  Service: Urology;  Laterality: Right;  . HEMICOLECTOMY Right 01/23/2015   Dr. Hassell Done  . IR FLUORO GUIDE PORT INSERTION RIGHT  03/24/2017  . IR US GUIDE VASC ACCESS  RIGHT  03/24/2017  . LAPAROSCOPY N/A 01/23/2015   Procedure: DIAGNOSTIC LAPAROSCOPY, EXPLORATORY LAPAROTOMY  RIGHT HEMI-COLECTOMY FOR OBSTRUCTION OF RIGHT TERMINAL ILEUM;  Surgeon: [name], MD;  Location[facility];  Service: General;  Laterality: N/A;    Home Medications:  Current Facility-Administered  Medications  Medication Dose Route Frequency Provider Last Rate Last Dose  . 0.9 %  sodium chloride infusion   Intravenous Continuous Catalina Gravel, MD 50 mL/hr at 04/10/17 1418    . ceFAZolin (ANCEF) IVPB 2g/100 mL premix  2 g Intravenous 30 min Pre-Op Janaysha Depaulo, [name], MD       Facility-Administered Medications Ordered in Other Encounters  Medication Dose Route Frequency Provider Last Rate Last Dose  . heparin lock flush 100 unit/mL  500 Units Intravenous Once [name], MD       Allergies:  Allergies  Allergen Reactions  . Namenda [Memantine Hcl] Other (See Comments)    Hallucinations and headaches  . Oxycodone Other (See Comments)    Hallucinations     Family History  Problem Relation Age of Onset  . Diabetes Mellitus II Father   . Cancer Mother 46       ovarian cancer   . Cervical cancer Other    Social History:  reports that she quit smoking about 2 years ago. Her smoking use included cigarettes. She has a 12.50 pack-year smoking history. she has never used smokeless tobacco. She reports that she drinks about 1.8 oz of alcohol per week. She reports that she does not use drugs.  Review of Systems  Genitourinary: Positive for frequency and urgency.  All other systems reviewed and are negative.   Physical Exam:  Vital signs in last 24 hours: Temp:  [97.5 F (36.4 C)] 97.5 F (36.4 C) (02/04 1332) Pulse Rate:  [93] 93 (02/04 1332) Resp:  [14] 14 (02/04 1332) BP: (148)/(79) 148/79 (02/04 1332) SpO2:  [100 %] 100 % (02/04 1332) Weight:  [50.2 kg (110 lb 9.6 oz)] 50.2 kg (110 lb 9.6 oz) (02/04 1332) Physical Exam  Constitutional: She is oriented to person, place, and time. She appears well-developed and well-nourished.  HENT:  Head: Normocephalic and atraumatic.  Eyes: EOM are normal. Pupils are equal, round, and reactive to light.  Neck: Normal range of motion. No thyromegaly present.  Cardiovascular: Normal rate and regular rhythm.  Respiratory: Effort  normal. No respiratory distress.  GI: Soft. She exhibits no distension.  Musculoskeletal: Normal range of motion. She exhibits no edema.  Neurological: She is alert and oriented to person, place, and time.  Skin: Skin is warm and dry.  Psychiatric: She has a normal mood and affect. Her behavior is normal. Judgment and thought content normal.    Laboratory Data:  Results for orders placed or performed during the hospital encounter of 04/10/17 (from the past 24 hour(s))  I-STAT 4, (NA,K, GLUC, HGB,HCT)     Status: None   Collection Time: 04/10/17  2:14 PM  Result Value Ref Range   Sodium 143 135 - 145 mmol/L   Potassium 4.3 3.5 - 5.1 mmol/L   Glucose, Bld 87 65 - 99 mg/dL   HCT 37.0 36.0 - 46.0 %   Hemoglobin 12.6 12.0 - 15.0 g/dL   No results found for this or any previous visit (from the past 240 hour(s)). Creatinine: No results for input(s): CREATININE in the last 168 hours. Baseline Creatinine: unknown  Impression/Assessment:  76yo with right malignant hydronephrosis managed with indwelling stent  Plan:  The risks/benefits/alternatives  to right ureteral stent exchange was explained to the patient and daughter and they understand and wishes to proceed with surgery  [name] 04/10/2017, 2:37 PM

## 2017-04-10 NOTE — Op Note (Signed)
Preoperative diagnosis: right malignant obstruction  Postoperative diagnosis: Same  Procedure: 1 cystoscopy 2. right retrograde pyelography 3. Intraoperative fluoroscopy, under one hour, with interpretation 4. right 8x 24JJ stent exchange  Attending: Alisa Stjames  Anesthesia: General  Estimated blood loss: None  Drains: Right 8x 24JJ polaris ureteral stent without tether  Specimens: none  Antibiotics: ancef  Findings: right mid ureteral narrowing. nohydronephrosis. No masses/lesions in the bladder. Ureteral orifices in normal anatomic location.  Indications: Patient is a 76-year-old female with a history of right malignant obstruction from peritoneal carcinomatosis. After discussing treatment options, they decided proceed with stent exchange.  Procedure her in detail: The patient was brought to the operating room and a brief timeout was done to ensure correct patient, correct procedure, correct site. General anesthesia was administered patient was placed in dorsal lithotomy position. Their genitalia was then prepped and draped in usual sterile fashion. A rigid 22 French cystoscope was passed in the urethra and the bladder. Bladder was inspected free masses or lesions. the ureteral orifices were in the normal orthotopic locations. Using a grasper the right stent was brought to the urethral meatus. A sensor wire was advanced through the stent and up to the renal pelvis.a 6 french ureteral catheter was then instilled into the right ureteral orifice. a gentle retrograde was obtained and findings noted above.We then placed a 8x 24double-j ureteral stent over the original sensor wire. We then removed the wire and good coil was noted in the the renal pelvis under fluoroscopy and the bladder under direct vision. the bladder was then drained and this concluded the procedure which was well tolerated by patient.  Complications: None  Condition: Stable,  extubated, transferred to PACU  Plan: The patient will be discharged home. She will followup in 8weeks with a renal US   

## 2017-04-10 NOTE — Transfer of Care (Signed)
Immediate Anesthesia Transfer of Care Note  Patient: Beth Wilkins  Procedure(s) Performed: CYSTOSCOPY WITH RETROGRADE PYELOGRAM/URETERAL STENT EXCHANGE (Right )  Patient Location: PACU  Anesthesia Type:General  Level of Consciousness: awake, alert , oriented and patient cooperative  Airway & Oxygen Therapy: Patient Spontanous Breathing and Patient connected to nasal cannula oxygen  Post-op Assessment: Report given to RN and Post -op Vital signs reviewed and stable  Post vital signs: Reviewed and stable  Last Vitals:  Vitals:   04/10/17 1332 04/10/17 1534  BP: (!) 148/79 (!) 140/92  Pulse: 93 76  Resp: 14 16  Temp: (!) 36.4 C 36.7 C  SpO2: 100% 100%    Last Pain:  Vitals:   04/10/17 1332  TempSrc: Oral      Patients Stated Pain Goal: (need to use face scale) (31/51/76 1607)  Complications: No apparent anesthesia complications

## 2017-04-10 NOTE — Anesthesia Preprocedure Evaluation (Addendum)
Anesthesia Evaluation  Patient identified by MRN, date of birth, ID band Patient awake    Reviewed: Allergy & Precautions, NPO status , Patient's Chart, lab work & pertinent test results  Airway Mallampati: II  TM Distance: >3 FB Neck ROM: Full    Dental no notable dental hx. (+) Teeth Intact   Pulmonary COPD, former smoker,    breath sounds clear to auscultation       Cardiovascular hypertension, Pt. on medications + Peripheral Vascular Disease   Rhythm:Regular Rate:Normal     Neuro/Psych  Headaches, PSYCHIATRIC DISORDERS Anxiety Dementia    GI/Hepatic GERD  ,  Endo/Other    Renal/GU Renal InsufficiencyRenal disease     Musculoskeletal  (+) Arthritis ,   Abdominal Normal abdominal exam  (+)   Peds  Hematology  (+) anemia ,   Anesthesia Other Findings   Reproductive/Obstetrics                            Anesthesia Physical  Anesthesia Plan  ASA: III  Anesthesia Plan: General   Post-op Pain Management:    Induction: Intravenous  PONV Risk Score and Plan: 3 and Ondansetron and Dexamethasone  Airway Management Planned: LMA  Additional Equipment:   Intra-op Plan:   Post-operative Plan:   Informed Consent: I have reviewed the patients History and Physical, chart, labs and discussed the procedure including the risks, benefits and alternatives for the proposed anesthesia with the patient or authorized representative who has indicated his/her understanding and acceptance.   Dental advisory given  Plan Discussed with: CRNA and Surgeon  Anesthesia Plan Comments:         Anesthesia Quick Evaluation

## 2017-04-10 NOTE — Anesthesia Postprocedure Evaluation (Signed)
Anesthesia Post Note  Patient: Beth Wilkins  Procedure(s) Performed: CYSTOSCOPY WITH RETROGRADE PYELOGRAM/URETERAL STENT EXCHANGE (Right )     Patient location during evaluation: PACU Anesthesia Type: General Level of consciousness: awake Pain management: pain level controlled Vital Signs Assessment: post-procedure vital signs reviewed and stable Respiratory status: spontaneous breathing Cardiovascular status: stable    Last Vitals:  Vitals:   04/10/17 1600 04/10/17 1630  BP: (!) 140/100 (!) 154/88  Pulse: 100 84  Resp: 15 15  Temp:  36.7 C  SpO2: 100% 100%    Last Pain:  Vitals:   04/10/17 1332  TempSrc: Oral                 Mahli Glahn

## 2017-04-10 NOTE — Anesthesia Procedure Notes (Signed)
Procedure Name: LMA Insertion Date/Time: 04/10/2017 3:10 PM Performed by: Wanita Chamberlain, CRNA Pre-anesthesia Checklist: Patient identified, Timeout performed, Emergency Drugs available, Suction available and Patient being monitored Patient Re-evaluated:Patient Re-evaluated prior to induction Oxygen Delivery Method: Circle system utilized Preoxygenation: Pre-oxygenation with 100% oxygen Induction Type: IV induction Ventilation: Mask ventilation without difficulty LMA: LMA inserted LMA Size: 4.0 Number of attempts: 1 Placement Confirmation: breath sounds checked- equal and bilateral,  CO2 detector and positive ETCO2 Tube secured with: Tape Dental Injury: Teeth and Oropharynx as per pre-operative assessment

## 2017-04-10 NOTE — Discharge Instructions (Signed)
Post Anesthesia Home Care Instructions  Activity: Get plenty of rest for the remainder of the day. A responsible individual must stay with you for 24 hours following the procedure.  For the next 24 hours, DO NOT: -Drive a car -Paediatric nurse -Drink alcoholic beverages -Take any medication unless instructed by your physician -Make any legal decisions or sign important papers.  Meals: Start with liquid foods such as gelatin or soup. Progress to regular foods as tolerated. Avoid greasy, spicy, heavy foods. If nausea and/or vomiting occur, drink only clear liquids until the nausea and/or vomiting subsides. Call your physician if vomiting continues.  Special Instructions/Symptoms: Your throat may feel dry or sore from the anesthesia or the breathing tube placed in your throat during surgery. If this causes discomfort, gargle with warm salt water. The discomfort should disappear within 24 hours.  If you had a scopolamine patch placed behind your ear for the management of post- operative nausea and/or vomiting:  1. The medication in the patch is effective for 72 hours, after which it should be removed.  Wrap patch in a tissue and discard in the trash. Wash hands thoroughly with soap and water. 2. You may remove the patch earlier than 72 hours if you experience unpleasant side effects which may include dry mouth, dizziness or visual disturbances. 3. Avoid touching the patch. Wash your hands with soap and water after contact with the patch.      Ureteral Stent Implantation, Care After Refer to this sheet in the next few weeks. These instructions provide you with information about caring for yourself after your procedure. Your health care provider may also give you more specific instructions. Your treatment has been planned according to current medical practices, but problems sometimes occur. Call your health care provider if you have any problems or questions after your procedure. What can I  expect after the procedure? After the procedure, it is common to have:  Nausea.  Mild pain when you urinate. You may feel this pain in your lower back or lower abdomen. Pain should stop within a few minutes after you urinate. This may last for up to 1 week.  A small amount of blood in your urine for several days.  Follow these instructions at home:  Medicines  Take over-the-counter and prescription medicines only as told by your health care provider.  If you were prescribed an antibiotic medicine, take it as told by your health care provider. Do not stop taking the antibiotic even if you start to feel better.  Do not drive for 24 hours if you received a sedative.  Do not drive or operate heavy machinery while taking prescription pain medicines. Activity  Return to your normal activities as told by your health care provider. Ask your health care provider what activities are safe for you.  Do not lift anything that is heavier than 10 lb (4.5 kg). Follow this limit for 1 week after your procedure, or for as long as told by your health care provider. General instructions  Watch for any blood in your urine. Call your health care provider if the amount of blood in your urine increases.  If you have a catheter: ? Follow instructions from your health care provider about taking care of your catheter and collection bag. ? Do not take baths, swim, or use a hot tub until your health care provider approves.  Drink enough fluid to keep your urine clear or pale yellow.  Keep all follow-up visits as told by your  health care provider. This is important. Contact a health care provider if:  You have pain that gets worse or does not get better with medicine, especially pain when you urinate.  You have difficulty urinating.  You feel nauseous or you vomit repeatedly during a period of more than 2 days after the procedure. Get help right away if:  Your urine is dark red or has blood clots in  it.  You are leaking urine (have incontinence).  The end of the stent comes out of your urethra.  You cannot urinate.  You have sudden, sharp, or severe pain in your abdomen or lower back.  You have a fever. This information is not intended to replace advice given to you by your health care provider. Make sure you discuss any questions you have with your health care provider. Document Released: 10/24/2012 Document Revised: 07/30/2015 Document Reviewed: 09/05/2014 Elsevier Interactive Patient Education  [employer].

## 2017-04-11 ENCOUNTER — Encounter (HOSPITAL_BASED_OUTPATIENT_CLINIC_OR_DEPARTMENT_OTHER): Payer: Self-pay | Admitting: Urology

## 2017-04-14 ENCOUNTER — Inpatient Hospital Stay: Payer: Medicare Other

## 2017-04-14 ENCOUNTER — Inpatient Hospital Stay: Payer: Medicare Other | Attending: Hematology

## 2017-04-14 ENCOUNTER — Telehealth: Payer: Self-pay | Admitting: Nurse Practitioner

## 2017-04-14 ENCOUNTER — Other Ambulatory Visit: Payer: Self-pay

## 2017-04-14 ENCOUNTER — Other Ambulatory Visit: Payer: Medicare Other

## 2017-04-14 ENCOUNTER — Inpatient Hospital Stay (HOSPITAL_BASED_OUTPATIENT_CLINIC_OR_DEPARTMENT_OTHER): Payer: Medicare Other | Admitting: Nurse Practitioner

## 2017-04-14 ENCOUNTER — Encounter: Payer: Self-pay | Admitting: Nurse Practitioner

## 2017-04-14 VITALS — BP 134/75 | HR 95 | Temp 98.0°F | Resp 20 | Ht 65.0 in | Wt 113.7 lb

## 2017-04-14 VITALS — BP 145/72

## 2017-04-14 DIAGNOSIS — Z5111 Encounter for antineoplastic chemotherapy: Secondary | ICD-10-CM | POA: Diagnosis not present

## 2017-04-14 DIAGNOSIS — Z7982 Long term (current) use of aspirin: Secondary | ICD-10-CM | POA: Insufficient documentation

## 2017-04-14 DIAGNOSIS — Z79899 Other long term (current) drug therapy: Secondary | ICD-10-CM | POA: Diagnosis not present

## 2017-04-14 DIAGNOSIS — F0390 Unspecified dementia without behavioral disturbance: Secondary | ICD-10-CM

## 2017-04-14 DIAGNOSIS — C786 Secondary malignant neoplasm of retroperitoneum and peritoneum: Secondary | ICD-10-CM | POA: Insufficient documentation

## 2017-04-14 DIAGNOSIS — K219 Gastro-esophageal reflux disease without esophagitis: Secondary | ICD-10-CM | POA: Diagnosis not present

## 2017-04-14 DIAGNOSIS — C182 Malignant neoplasm of ascending colon: Secondary | ICD-10-CM

## 2017-04-14 DIAGNOSIS — J439 Emphysema, unspecified: Secondary | ICD-10-CM

## 2017-04-14 DIAGNOSIS — I2510 Atherosclerotic heart disease of native coronary artery without angina pectoris: Secondary | ICD-10-CM | POA: Insufficient documentation

## 2017-04-14 DIAGNOSIS — I129 Hypertensive chronic kidney disease with stage 1 through stage 4 chronic kidney disease, or unspecified chronic kidney disease: Secondary | ICD-10-CM | POA: Diagnosis not present

## 2017-04-14 DIAGNOSIS — I10 Essential (primary) hypertension: Secondary | ICD-10-CM

## 2017-04-14 DIAGNOSIS — E785 Hyperlipidemia, unspecified: Secondary | ICD-10-CM

## 2017-04-14 DIAGNOSIS — N183 Chronic kidney disease, stage 3 (moderate): Secondary | ICD-10-CM | POA: Insufficient documentation

## 2017-04-14 DIAGNOSIS — D638 Anemia in other chronic diseases classified elsewhere: Secondary | ICD-10-CM

## 2017-04-14 DIAGNOSIS — K5900 Constipation, unspecified: Secondary | ICD-10-CM | POA: Diagnosis not present

## 2017-04-14 DIAGNOSIS — C787 Secondary malignant neoplasm of liver and intrahepatic bile duct: Secondary | ICD-10-CM | POA: Insufficient documentation

## 2017-04-14 DIAGNOSIS — Z9221 Personal history of antineoplastic chemotherapy: Secondary | ICD-10-CM | POA: Insufficient documentation

## 2017-04-14 DIAGNOSIS — I700 Atherosclerosis of aorta: Secondary | ICD-10-CM | POA: Insufficient documentation

## 2017-04-14 DIAGNOSIS — R197 Diarrhea, unspecified: Secondary | ICD-10-CM

## 2017-04-14 DIAGNOSIS — G4700 Insomnia, unspecified: Secondary | ICD-10-CM

## 2017-04-14 DIAGNOSIS — Z5112 Encounter for antineoplastic immunotherapy: Secondary | ICD-10-CM | POA: Insufficient documentation

## 2017-04-14 LAB — CBC WITH DIFFERENTIAL/PLATELET
Basophils Absolute: 0 10*3/uL (ref 0.0–0.1)
Basophils Relative: 1 %
Eosinophils Absolute: 0.3 10*3/uL (ref 0.0–0.5)
Eosinophils Relative: 4 %
HEMATOCRIT: 33.1 % — AB (ref 34.8–46.6)
HEMOGLOBIN: 10.7 g/dL — AB (ref 11.6–15.9)
LYMPHS ABS: 1.3 10*3/uL (ref 0.9–3.3)
LYMPHS PCT: 16 %
MCH: 29 pg (ref 25.1–34.0)
MCHC: 32.3 g/dL (ref 31.5–36.0)
MCV: 89.7 fL (ref 79.5–101.0)
MONOS PCT: 10 %
Monocytes Absolute: 0.8 10*3/uL (ref 0.1–0.9)
NEUTROS ABS: 5.8 10*3/uL (ref 1.5–6.5)
NEUTROS PCT: 69 %
Platelets: 287 10*3/uL (ref 145–400)
RBC: 3.69 MIL/uL — ABNORMAL LOW (ref 3.70–5.45)
RDW: 18.9 % — ABNORMAL HIGH (ref 11.2–14.5)
WBC: 8.2 10*3/uL (ref 3.9–10.3)

## 2017-04-14 LAB — UA PROTEIN, DIPSTICK - CHCC: PROTEIN: 100 mg/dL — AB

## 2017-04-14 LAB — COMPREHENSIVE METABOLIC PANEL
ALT: 7 U/L (ref 0–55)
ANION GAP: 8 (ref 3–11)
AST: 16 U/L (ref 5–34)
Albumin: 3.4 g/dL — ABNORMAL LOW (ref 3.5–5.0)
Alkaline Phosphatase: 76 U/L (ref 40–150)
BUN: 14 mg/dL (ref 7–26)
CHLORIDE: 109 mmol/L (ref 98–109)
CO2: 24 mmol/L (ref 22–29)
Calcium: 8.9 mg/dL (ref 8.4–10.4)
Creatinine, Ser: 1.02 mg/dL (ref 0.60–1.10)
GFR calc non Af Amer: 52 mL/min — ABNORMAL LOW (ref >60)
Glucose, Bld: 92 mg/dL (ref 70–140)
Potassium: 3.8 mmol/L (ref 3.5–5.1)
SODIUM: 141 mmol/L (ref 136–145)
Total Bilirubin: 0.3 mg/dL (ref 0.2–1.2)
Total Protein: 6.9 g/dL (ref 6.4–8.3)

## 2017-04-14 LAB — CEA (IN HOUSE-CHCC): CEA (CHCC-In House): 11.93 ng/mL — ABNORMAL HIGH (ref 0.00–5.00)

## 2017-04-14 MED ORDER — SODIUM CHLORIDE 0.9% FLUSH
10.0000 mL | Freq: Once | INTRAVENOUS | Status: AC
  Administered 2017-04-14: 10 mL
  Filled 2017-04-14: qty 10

## 2017-04-14 MED ORDER — HEPARIN SOD (PORK) LOCK FLUSH 100 UNIT/ML IV SOLN
500.0000 [IU] | Freq: Once | INTRAVENOUS | Status: DC | PRN
  Filled 2017-04-14: qty 5

## 2017-04-14 MED ORDER — PALONOSETRON HCL INJECTION 0.25 MG/5ML
0.2500 mg | Freq: Once | INTRAVENOUS | Status: AC
  Administered 2017-04-14: 0.25 mg via INTRAVENOUS

## 2017-04-14 MED ORDER — SODIUM CHLORIDE 0.9 % IV SOLN
Freq: Once | INTRAVENOUS | Status: AC
  Administered 2017-04-14: 11:00:00 via INTRAVENOUS

## 2017-04-14 MED ORDER — HEPARIN SOD (PORK) LOCK FLUSH 100 UNIT/ML IV SOLN
500.0000 [IU] | Freq: Once | INTRAVENOUS | Status: AC
  Administered 2017-04-14: 500 [IU]
  Filled 2017-04-14: qty 5

## 2017-04-14 MED ORDER — ATROPINE SULFATE 1 MG/ML IJ SOLN
INTRAMUSCULAR | Status: AC
  Filled 2017-04-14: qty 1

## 2017-04-14 MED ORDER — SODIUM CHLORIDE 0.9 % IV SOLN
5.0000 mg/kg | Freq: Once | INTRAVENOUS | Status: AC
  Administered 2017-04-14: 275 mg via INTRAVENOUS
  Filled 2017-04-14: qty 11

## 2017-04-14 MED ORDER — DEXAMETHASONE SODIUM PHOSPHATE 10 MG/ML IJ SOLN
10.0000 mg | Freq: Once | INTRAMUSCULAR | Status: AC
  Administered 2017-04-14: 10 mg via INTRAVENOUS

## 2017-04-14 MED ORDER — SODIUM CHLORIDE 0.9% FLUSH
10.0000 mL | INTRAVENOUS | Status: DC | PRN
  Filled 2017-04-14: qty 10

## 2017-04-14 MED ORDER — IRINOTECAN HCL CHEMO INJECTION 100 MG/5ML
150.0000 mg/m2 | Freq: Once | INTRAVENOUS | Status: AC
  Administered 2017-04-14: 240 mg via INTRAVENOUS
  Filled 2017-04-14: qty 12

## 2017-04-14 MED ORDER — PALONOSETRON HCL INJECTION 0.25 MG/5ML
INTRAVENOUS | Status: AC
  Filled 2017-04-14: qty 5

## 2017-04-14 MED ORDER — DEXAMETHASONE SODIUM PHOSPHATE 10 MG/ML IJ SOLN
INTRAMUSCULAR | Status: AC
  Filled 2017-04-14: qty 1

## 2017-04-14 MED ORDER — ATROPINE SULFATE 1 MG/ML IJ SOLN
0.5000 mg | Freq: Once | INTRAMUSCULAR | Status: AC | PRN
  Administered 2017-04-14: 0.5 mg via INTRAVENOUS

## 2017-04-14 NOTE — Progress Notes (Signed)
Ok to treat with urine protein of 100 per NP Regan Rakers

## 2017-04-14 NOTE — Patient Instructions (Signed)
[facility] Discharge Instructions for Patients Receiving Chemotherapy  Today you received the following chemotherapy agents :  Irinotecan,  Avastin.  To help prevent nausea and vomiting after your treatment, we encourage you to take your nausea medication as prescribed.   If you develop nausea and vomiting that is not controlled by your nausea medication, call the clinic.   BELOW ARE SYMPTOMS THAT SHOULD BE REPORTED IMMEDIATELY:  *FEVER GREATER THAN 100.5 F  *CHILLS WITH OR WITHOUT FEVER  NAUSEA AND VOMITING THAT IS NOT CONTROLLED WITH YOUR NAUSEA MEDICATION  *UNUSUAL SHORTNESS OF BREATH  *UNUSUAL BRUISING OR BLEEDING  TENDERNESS IN MOUTH AND THROAT WITH OR WITHOUT PRESENCE OF ULCERS  *URINARY PROBLEMS  *BOWEL PROBLEMS  UNUSUAL RASH Items with * indicate a potential emergency and should be followed up as soon as possible.  Feel free to call the clinic should you have any questions or concerns. The clinic phone number is [phone].  Please show the CHEMO ALERT CARD at check-in to the Emergency Department and triage nurse.   

## 2017-04-14 NOTE — Progress Notes (Signed)
[place]  [phone] [phone]  Clinic Follow up Note   Patient Care Team: [name], MD as PCP - General (Internal Medicine) [name], MD as Consulting Physician (Medical Oncology) [name], MD as Consulting Physician (General Surgery) [name], MD as Consulting Physician (Orthopedic Surgery) [name] [name], MD as Consulting Physician (Urology) 04/14/2017  CHIEF COMPLAINT: F/u metastatic colon cancer   SUMMARY OF ONCOLOGIC HISTORY: Oncology History   Cancer of right colon [facility])   Staging form: Colon and Rectum, AJCC 7th Edition     Pathologic stage from 01/23/2015: Stage IIC (T4b, N0, cM0) - Signed by [name], MD on 02/05/2015       Cancer of right colon ([place])   01/21/2015 Imaging     CT abdomen and pelvis showed high-grade small bowel obstruction,  right I [place] ureteral nephrosis,  multiple liver cysts.  CT chest was negative for metastatic lesion.      01/23/2015 Pathology Results     invasive adenocarcinoma extending into the pericecal connective tissue,  grade 2, LVI (-),  no perineural invasion, 3 lymph nodes were negative.      01/23/2015 Surgery      terminal ileum and cecum seegmental resection with anastomosis by Dr. Hassell Done      01/23/2015 Initial Diagnosis    Cancer of right colon ([place])      03/13/2015 - 07/06/2015 Chemotherapy    Xeloda 1500 mg bid X 14 days on-7 off, s/p 5 cycles, stopped early due to wrosening renal function      10/28/2015 Progression    PET scan showed multiple hypermetabolic peritoneal nodules, right ovarian cystic mass, and hypermetabolic liver lesion, highly suspicious for metastatic colon cancer. Small pulmonary nodules are indeterminate.       11/11/2015 - 05/20/2016 Chemotherapy    Xeloda '1500mg'$  bid, 2 weeks on and one week off, Avastin was added on from cycle 3. Xeloda held from 05/20/16 due to progression on CT from 05/19/16 and total bilirubin increased to 2.6.      01/08/2016 Imaging    Ct chest, Abdomen Pelvis Wo Contrast 01/08/2016  IMPRESSION: 1. 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 2. Hepatic and renal cysts. 3.  Aortoiliac atherosclerotic vascular disease. 4. Lumbar spondylosis and degenerative disc disease with multilevel Impingement.      05/19/2016 Imaging    CT CAP w Contrast IMPRESSION: 1. Numerous small solid pulmonary nodules, one of which is new, and several of which have mildly increased in size since 10/28/2015, worrisome for mild progression of pulmonary metastases. 2. Solitary right liver lobe metastasis is mildly increased in size since 01/08/2016. 3. Peritoneal metastases have mildly increased in size since 01/08/2016, largest in the right pelvic sidewall. 4. Well-positioned right nephroureteral stent without overt right hydronephrosis. 5. Additional findings include aortic atherosclerosis, 1 vessel coronary atherosclerosis and mild to moderate emphysema.      06/03/2016 -  Chemotherapy    Second-Line Irinotecan and Avastin every 2 weeks starting 06/03/16      07/29/2016 Tumor Marker    CEA 6.51       08/09/2016 Imaging    PET  IMPRESSION: 1. Overall mild improvement compared to FDG PET scan of 10/28/2015. 2. Pulmonary nodules are increased in size from PET-CT scan and similar size to most recent CT scan. Largest lesion RIGHT lower lobe does have associated faint metabolic activity consistent with pulmonary metastasis. 3. Decrease in metabolic activity of RIGHT hepatic lobe lesion.  Potential second lesion LEFT hepatic lobe with slightly lower metabolic activity. 4. Decreased metabolic activity at the enteric colonic anastomosis in the RIGHT lower quadrant. 5. Interval decrease in metabolic activity of peritoneal nodular metastasis. 6. Nodular implant implant along ventral abdominal wall may be within the musculature, but also improved.       09/09/2016 Tumor Marker    CEA: 7.47      10/07/2016 Tumor Marker    CEA: 7.85      11/04/2016 Tumor Marker    CEA: 7.75      11/25/2016 PET scan    PET 11/25/16 IMPRESSION: 1. Interval worsening in the interval. Numerous pulmonary nodules are again seen. While multiple nodules are stable, there are several nodules which are a little larger in the interval. They remain too small to completely characterize with PET-CT. The 2 hepatic metastases demonstrate increased FDG uptake on today's study compared to the previous study. Abdominal wall and peritoneal metastases are mildly worsened.      12/02/2016 Tumor Marker    CEA: 8.13      01/06/2017 Tumor Marker    CEA: 10.36      02/03/2017 Tumor Marker    CEA: 12.60      03/03/2017 Tumor Marker    CEA: 9.96      03/13/2017 PET scan    IMPRESSION: 1. Hepatic metastases are stable in size with stable to minimally decreased hypermetabolism. 2. Inferior left rectus abdominus metastasis shows mildly decreased hypermetabolism. 3. Omental nodule no longer shows abnormal hypermetabolism. 4. Scattered pulmonary nodules are stable in size and too small for PET resolution. 5.  Aortic atherosclerosis (ICD10-170.0). 6.  Emphysema (ICD10-J43.9).     CURRENT THERAPY: Second-Line Irinotecan and Avastin every 2 weeks started 06/03/16, Avastin held on 03/17/2017 due to proteinuria  INTERVAL HISTORY: Beth Wilkins returns for follow up as scheduled.  She was last treated with Irinotecan/Avastin on 03/31/2017.  Her daughter reports she has "brain fog" for 3-4 days after each treatment. She feels well without changes in her health overall.  Occasional constipation is managed with MiraLAX and mag citrate as needed.  She has one episode of diarrhea usually per week, uses Imodium infrequently.  She had cystoscopy with ureteral stent exchange on 04/10/2017, tolerated procedure well; no hematuria.  Eating well with good appetite; no fever or chills.  REVIEW OF  SYSTEMS:   Constitutional: Denies fatigue, fevers, chills or abnormal weight loss Eyes: Denies blurriness of vision Ears, nose, mouth, throat, and face: Denies mucositis or sore throat Respiratory: Denies cough, dyspnea or wheezes Cardiovascular: Denies palpitation, chest discomfort or lower extremity swelling Gastrointestinal:  Denies nausea, vomiting, heartburn (+) fluctuates with periodic constipation and diarrhea, managed with miralax/mag citrate and imodium, respectively. Skin: Denies abnormal skin rashes Lymphatics: Denies new lymphadenopathy or easy bruising Neurological:Denies numbness, tingling or new weaknesses (+) dementia, stable (+) brain fog 3-4 days after treatment  Behavioral/Psych: Mood is stable, no new changes  All other systems were reviewed with the patient and are negative.  MEDICAL HISTORY:  Past Medical History:  Diagnosis Date  . Anxiety   . Aortic atherosclerosis ([place]) 03/15/2017   noted on PET   . Arthritis   . Bilateral renal cysts   . Centrilobular emphysema ([place]) per 05-10-16 chest xray  . CKD (chronic kidney disease), stage III ([place])   . Colon cancer [facility]) oncologist-  dr [name]   dx 01-23-2015  right colon Invasive adenocarcinoma into pericecal connective tissue and involving small intestine , Stage IIc,  Grade 2, LVI(-),  (pT4b N0 M0) /  s/p resection termial ileum and cecum and chemotherapy 01-06-217 to 07-06-2015/   08/ 2017  per PET  recurrent w/ liver mets and peritoneal carcinomatosis  . Complication of anesthesia    can be combative due to dementia  . Full dentures   . Headache    occ stress related  . History of chemotherapy    every other friday currently receiving  . History of gastroesophageal reflux (GERD)    changes diet  . History of pelvic fracture 2015  . Hyperlipidemia   . Hypertension   . Liver metastasis ([place])   . Moderate dementia without behavioral disturbance    moderate to severe dementia per neurologist note (dr Krista Blue) in  epic  . N&V (nausea and vomiting)   . Pulmonary nodules   . Right ovarian cyst   . SBO (small bowel obstruction) ([place]) 01/2015  . 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 UTI (urinary tract infection) 09/26/2016   5 day antibiotic treatment    SURGICAL HISTORY: Past Surgical History:  Procedure Laterality Date  . CATARACT EXTRACTION W/ INTRAOCULAR LENS  IMPLANT, BILATERAL  2005 approx  . CYSTOSCOPY W/ URETERAL STENT PLACEMENT Right 12/03/2015   Procedure: CYSTOSCOPY WITH RETROGRADE PYELOGRAM/URETERAL STENT PLACEMENT;  Surgeon: Cleon Gustin, MD;  Location: [facility];  Service: Urology;  Laterality: Right;  . CYSTOSCOPY W/ URETERAL STENT PLACEMENT Right 02/15/2016   Procedure: CYSTOSCOPY WITH RETROGRADE PYELOGRAM/URETERAL STENT REPLACEMENT;  Surgeon: Cleon Gustin, MD;  Location: [facility];  Service: Urology;  Laterality: Right;  . CYSTOSCOPY W/ URETERAL STENT PLACEMENT Right 03/28/2016   Procedure: CYSTOSCOPY WITH RETROGRADE PYELOGRAM/URETERAL STENT EXCHANGE;  Surgeon: Cleon Gustin, MD;  Location: [facility];  Service: Urology;  Laterality: Right;  . CYSTOSCOPY W/ URETERAL STENT PLACEMENT Right 07/18/2016   Procedure: CYSTOSCOPY WITH RETROGRADE PYELOGRAM/ STENT EXCHANGE;  Surgeon: Cleon Gustin, MD;  Location: [facility];  Service: Urology;  Laterality: Right;  . CYSTOSCOPY W/ URETERAL STENT PLACEMENT Right 10/10/2016   Procedure: CYSTOSCOPY WITH RETROGRADE PYELOGRAM/URETERAL STENT REPLACEMENT;  Surgeon: Cleon Gustin, MD;  Location: [facility];  Service: Urology;  Laterality: Right;  . CYSTOSCOPY W/ URETERAL STENT PLACEMENT Right 04/10/2017   Procedure: CYSTOSCOPY WITH RETROGRADE PYELOGRAM/URETERAL STENT EXCHANGE;  Surgeon: Cleon Gustin, MD;  Location: [facility];  Service: Urology;  Laterality: Right;  . CYSTOSCOPY WITH RETROGRADE PYELOGRAM, URETEROSCOPY AND STENT PLACEMENT Right 09/21/2015   Procedure: CYSTOSCOPY WITH BIL RETROGRADE PYELOGRAM, URETEROSCOPY, RIGHT STENT PLACEMENT ;  Surgeon: Cleon Gustin, MD;  Location: [facility];  Service: Urology;  Laterality: Right;  . HEMICOLECTOMY Right 01/23/2015   Dr. Hassell Done  . IR FLUORO GUIDE PORT INSERTION RIGHT  03/24/2017  . IR US GUIDE VASC ACCESS RIGHT  03/24/2017  . LAPAROSCOPY N/A 01/23/2015   Procedure: DIAGNOSTIC LAPAROSCOPY, EXPLORATORY LAPAROTOMY  RIGHT HEMI-COLECTOMY FOR OBSTRUCTION OF RIGHT TERMINAL ILEUM;  Surgeon: [name], MD;  Location: [facility];  Service: General;  Laterality: N/A;    I have reviewed the social history and family history with the patient and they are unchanged from previous note.  ALLERGIES:  is allergic to namenda [memantine hcl] and oxycodone.  MEDICATIONS:  Current Outpatient Medications  Medication Sig Dispense Refill  . amLODipine (NORVASC) 5 MG tablet Take 5 mg by mouth  every morning.     Marland Kitchen aspirin 81 MG tablet Take 81 mg by mouth daily.    . Cholecalciferol (VITAMIN D3) 2000 UNITS TABS Take by mouth daily.    . Coenzyme Q10 (CO Q 10) 100 MG CAPS Take by mouth daily.    Marland Kitchen lidocaine-prilocaine (EMLA) cream Apply 1 application topically as needed. 30 g 5  . Meth-Hyo-M Bl-Na Phos-Ph Sal (URIBEL) 118 MG CAPS Take 1 capsule (118 mg total) by mouth 2 (two) times daily as needed. 30 capsule 3  . mirabegron ER (MYRBETRIQ) 50 MG TB24 tablet Take 1 tablet (50 mg total) by mouth daily. 30 tablet 11  . mirtazapine (REMERON) 15 MG tablet TAKE 1 TABLET BY MOUTH AT BEDTIME 30 tablet 3  . Multiple Vitamins-Minerals (WOMENS MULTIVITAMIN PO) Take 1 tablet by mouth daily. With iron    . polyethylene glycol (MIRALAX / GLYCOLAX) packet Take 17 g by mouth daily. (Patient taking differently: Take 17 g by mouth daily as needed. ) 14 each 0  . QUEtiapine  (SEROQUEL) 25 MG tablet Take 1 tablet (25 mg total) by mouth at bedtime. Take 1/2 tablet at night for 1 week, then increase to 1 tablet at night 30 tablet 6  . UNABLE TO FIND Med Name: Closys Mouth Rinse two-three times daily for ulcer.    . vitamin B-12 (CYANOCOBALAMIN) 1000 MCG tablet Take 1,000 mcg by mouth daily.    . potassium chloride SA (K-DUR,KLOR-CON) 20 MEQ tablet Take 1 tablet (20 mEq total) by mouth daily. (Patient not taking: Reported on 04/14/2017) 30 tablet 0  . prochlorperazine (COMPAZINE) 5 MG tablet Take 1 tablet (5 mg total) by mouth every 6 (six) hours as needed for nausea or vomiting. (Patient not taking: Reported on 04/14/2017) 30 tablet 0  . traMADol (ULTRAM) 50 MG tablet Take 1 tablet (50 mg total) by mouth every 6 (six) hours as needed. (Patient not taking: Reported on 04/14/2017) 30 tablet 0   No current facility-administered medications for this visit.    Facility-Administered Medications Ordered in Other Visits  Medication Dose Route Frequency Provider Last Rate Last Dose  . heparin lock flush 100 unit/mL  500 Units Intravenous Once [name], MD      . heparin lock flush 100 unit/mL  500 Units Intracatheter Once [name], MD      . heparin lock flush 100 unit/mL  500 Units Intracatheter Once PRN [name], MD      . sodium chloride flush (NS) 0.9 % injection 10 mL  10 mL Intracatheter Once [name], MD      . sodium chloride flush (NS) 0.9 % injection 10 mL  10 mL Intracatheter PRN [name], MD        PHYSICAL EXAMINATION: ECOG PERFORMANCE STATUS: 1 - Symptomatic but completely ambulatory  Vitals:   04/14/17 1024  BP: 134/75  Pulse: 95  Resp: 20  Temp: 98 F (36.7 C)  SpO2: 100%   Filed Weights   04/14/17 1024  Weight: 113 lb 11.2 oz (51.6 kg)    GENERAL:alert, no distress and comfortable SKIN: skin color, texture, turgor are normal, no rashes or significant lesions EYES: normal, Conjunctiva are pink and non-injected, sclera clear OROPHARYNX:no exudate,  no erythema and lips, buccal mucosa, and tongue normal  NECK: supple, thyroid normal size, non-tender, without nodularity LYMPH:  no palpable cervical, supraclavicular, axillary, or inguinal lymphadenopathy LUNGS: clear to auscultation bilaterally with normal breathing effort HEART: regular rate & rhythm and no murmurs and no lower  extremity edema ABDOMEN:abdomen soft, non-tender and normal bowel sounds Musculoskeletal:no cyanosis of digits and no clubbing  NEURO: alert & oriented x 3 with fluent speech, no focal motor/sensory deficits PAC without erythema    LABORATORY DATA:  I have reviewed the data as listed CBC Latest Ref Rng & Units 04/14/2017 04/10/2017 03/31/2017  WBC 3.9 - 10.3 K/uL 8.2 - 5.5  Hemoglobin 11.6 - 15.9 g/dL 10.7(L) 12.6 11.4(L)  Hematocrit 34.8 - 46.6 % 33.1(L) 37.0 35.2  Platelets 145 - 400 K/uL 287 - 299     CMP Latest Ref Rng & Units 04/14/2017 04/10/2017 03/31/2017  Glucose 70 - 140 mg/dL 92 87 76  BUN 7 - 26 mg/dL 14 - 15  Creatinine 0.60 - 1.10 mg/dL 1.02 - 1.12(H)  Sodium 136 - 145 mmol/L 141 143 141  Potassium 3.5 - 5.1 mmol/L 3.8 4.3 4.8(H)  Chloride 98 - 109 mmol/L 109 - 108  CO2 22 - 29 mmol/L 24 - 25  Calcium 8.4 - 10.4 mg/dL 8.9 - 9.5  Total Protein 6.4 - 8.3 g/dL 6.9 - 7.2  Total Bilirubin 0.2 - 1.2 mg/dL 0.3 - 0.4  Alkaline Phos 40 - 150 U/L 76 - 76  AST 5 - 34 U/L 16 - 17  ALT 0 - 55 U/L 7 - 7   CEA  01/23/2015: 5.7 06/05/2015: 6.1 08/04/2015: 10.3 10/12/2015: 13.29 (in-house)  11/04/2015: 12.83 01/13/2016: 8.57 02/24/2016: 8.41 04/27/2016: 9.22 05/20/2016: 8.32 06/17/2016: 6.45 07/15/2016: 5.07 07/29/2016: 6.51 09/09/16: 7.47 10/07/16: 7.85 11/04/16: 7.75 12/02/16: 8.13 01/06/17: 10.36 02/03/17: 12.60 03/03/18: 9.96  04/14/17: 11.93   PATHOLOGY REPORT: Diagnosis 01/23/2015 Colon, segmental resection, with terminal ileum and cecum - INVASIVE ADENOCARCINOMA EXTENDING INTO PERICECAL CONNECTIVE TISSUE AND INVOLVING SMALL INTESTINE. - THREE  BENIGN LYMPH NODES (0/3). - SEE ONCOLOGY TABLE BELOW.  Microscopic Comment COLON AND RECTUM (INCLUDING TRANS-ANAL RESECTION): Specimen: Terminal ileum and cecum. Procedure: Resection. Tumor site: Cecum adjacent to appendix. Specimen integrity: Intact. Macroscopic intactness of mesorectum: Not applicable. Macroscopic tumor perforation: No. Invasive tumor: Maximum size: 4.0 cm. Histologic type(s): Colorectal adenocarcinoma. Histologic grade and differentiation: G2: moderately differentiated/low grade. Type of polyp in which invasive carcinoma arose: No residual polyp. Microscopic extension of invasive tumor: Into pericecal connective tissue and terminal ileum. Lymph-Vascular invasion: Not identified. Peri-neural invasion: Not identified. Tumor deposit(s) (discontinuous extramural extension): No. Resection margins: Proximal margin: Free of tumor. Distal margin: Free of tumor. Circumferential (radial) (posterior ascending, posterior descending; lateral and posterior mid-rectum; and entire lower 1/3 rectum): Free of tumor. Mesenteric margin (sigmoid and transverse): N/A. Distance closest margin (if all above margins negative): N/A. Treatment effect (neo-adjuvant therapy): No. Additional polyp(s): No. Non-neoplastic findings: N/A. Lymph nodes: number examined 3; number positive: 0. Pathologic Staging: pT4b, pN0, pMX. Ancillary studies: Mismatch repair protein by immunohistochemistry pending. Comments: There is a colorectal-type adenocarcinoma arising in the cecum in the area of the appendiceal orifice. The tumor extends into the pericecal connective tissue, and involves the attached segment of terminal ileum. Immunohistochemistry shows the tumor is positive with CDX2 and cytokeratin 20, and negative with cytokeratin 7, estrogen receptor and WT-1 supporting a diagnosis of primary colorectal adenocarcinoma. ADDITIONAL INFORMATION: Mismatch Repair (MMR) Protein Immunohistochemistry  (IHC) IHC Expression Result: MLH1: Preserved nuclear expression (greater 50% tumor expression) MSH2: Preserved nuclear expression (greater 50% tumor expression) MSH6: Preserved nuclear expression (greater 50% tumor expression) PMS2: Preserved nuclear expression (greater 50% tumor expression) * Internal control demonstrates intact nuclear expression Interpretation: NORMAL There is preserved expression of the major and minor MMR proteins.  There is a very low probability that microsatellite instability (MSI) is present. However, certain clinically significant MMR protein mutations may result in preservation of nuclear expression. It is recommended that the preservation of protein expression be correlated with molecular based MSI testing.    RADIOGRAPHIC STUDIES: I have personally reviewed the radiological images as listed and agreed with the findings in the report.  PET 03/13/17 IMPRESSION: 1. Hepatic metastases are stable in size with stable to minimally decreased hypermetabolism. 2. Inferior left rectus abdominus metastasis shows mildly decreased hypermetabolism. 3. Omental nodule no longer shows abnormal hypermetabolism. 4. Scattered pulmonary nodules are stable in size and too small for PET resolution. 5. Aortic atherosclerosis (ICD10-170.0). 6. Emphysema (ICD10-J43.9).   ASSESSMENT & PLAN: 77 y.o. African-American female, with past medical history of moderate dementia, independent with ADLs, hypertension, presents with small bowel obstruction and weight loss.  1. 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 Bluestein appears stable today. She completed another cycle of irinotecan/avastin on 03/31/17; she continues to tolerate well. Constipation and diarrhea are well managed with PRN medication. VS and weight stable. Labs reviewed, CBC and CMP stable and adequate to proceed with  irinotecan/avastin today, continue q2 weeks. Return in 2 weeks for next cycle, f/u in 4 weeks   2.  CKD stage III, right hydroureter S/p right ureteral stent exchange on 2/4; she tolerated procedure well -Cr. Is normal -continue f/u with renal  3.  HTN, dementia -Dementia is stable, on seroquel -BP has been well controlled lately, on amlodipine  4. Nausea and diarrhea -Her nausea is resolved -She mostly deals with constipation, daughter gives miralax or mag citrate PRN to maintain regularity. She gets diarrhea once weekly, rarely requires imodium -she increases po liquids during times with diarrhea -Overall well managed  5. Goals of care  -Patient and family agree to continue this regimen while patient tolerates it; she is tolerating well   6. Insomnia -Her daughter reports she is sleeping well lately  7. Social support -She is well cared for by her daughter and family, she has a "companion" who visits her often and encourages her to exercise  PLAN -Labs reviewed, proceed with irinotecan/avastin today and continue q2 weeks -continue bowel regimen  -Return for next cycle in 2 weeks, f/u with Dr. Burr Medico in 4 weeks   All questions were answered. The patient knows to call the clinic with any problems, questions or concerns. No barriers to learning was detected.    Beth Feeling, Beth Wilkins 04/14/17

## 2017-04-19 MED FILL — URO-MP CAPSULE: 118 | 15 days supply | Qty: 30 | Fill #1

## 2017-04-19 MED FILL — MYRBETRIQ ER 50 MG TABLET: 50 | 30 days supply | Qty: 30 | Fill #0

## 2017-04-19 MED FILL — QUETIAPINE 25 MG TABLET: 25 | 30 days supply | Qty: 30 | Fill #3

## 2017-04-21 MED FILL — MIRTAZAPINE 15 MG TAB: 15 | 30 days supply | Qty: 30 | Fill #3

## 2017-04-28 ENCOUNTER — Inpatient Hospital Stay: Payer: Medicare Other

## 2017-04-28 ENCOUNTER — Other Ambulatory Visit: Payer: Medicare Other

## 2017-04-28 ENCOUNTER — Telehealth: Payer: Self-pay | Admitting: Hematology

## 2017-04-28 VITALS — BP 132/76 | HR 92 | Temp 97.6°F | Resp 17 | Wt 115.5 lb

## 2017-04-28 DIAGNOSIS — C182 Malignant neoplasm of ascending colon: Secondary | ICD-10-CM | POA: Diagnosis not present

## 2017-04-28 LAB — CBC WITH DIFFERENTIAL/PLATELET
Basophils Absolute: 0.1 10*3/uL (ref 0.0–0.1)
Basophils Relative: 1 %
Eosinophils Absolute: 0.3 10*3/uL (ref 0.0–0.5)
Eosinophils Relative: 6 %
HEMATOCRIT: 33 % — AB (ref 34.8–46.6)
Hemoglobin: 10.8 g/dL — ABNORMAL LOW (ref 11.6–15.9)
LYMPHS ABS: 1.1 10*3/uL (ref 0.9–3.3)
LYMPHS PCT: 22 %
MCH: 29.2 pg (ref 25.1–34.0)
MCHC: 32.6 g/dL (ref 31.5–36.0)
MCV: 89.5 fL (ref 79.5–101.0)
MONO ABS: 0.5 10*3/uL (ref 0.1–0.9)
Monocytes Relative: 9 %
NEUTROS ABS: 3.1 10*3/uL (ref 1.5–6.5)
Neutrophils Relative %: 62 %
Platelets: 329 10*3/uL (ref 145–400)
RBC: 3.69 MIL/uL — ABNORMAL LOW (ref 3.70–5.45)
RDW: 19.8 % — AB (ref 11.2–14.5)
WBC: 5.1 10*3/uL (ref 3.9–10.3)

## 2017-04-28 LAB — COMPREHENSIVE METABOLIC PANEL
ALK PHOS: 77 U/L (ref 40–150)
ALT: 13 U/L (ref 0–55)
ANION GAP: 8 (ref 3–11)
AST: 18 U/L (ref 5–34)
Albumin: 3.4 g/dL — ABNORMAL LOW (ref 3.5–5.0)
BUN: 13 mg/dL (ref 7–26)
CALCIUM: 9.3 mg/dL (ref 8.4–10.4)
CO2: 24 mmol/L (ref 22–29)
Chloride: 110 mmol/L — ABNORMAL HIGH (ref 98–109)
Creatinine, Ser: 1.1 mg/dL (ref 0.60–1.10)
GFR calc Af Amer: 55 mL/min — ABNORMAL LOW (ref >60)
GFR calc non Af Amer: 48 mL/min — ABNORMAL LOW (ref >60)
Glucose, Bld: 86 mg/dL (ref 70–140)
Potassium: 4.1 mmol/L (ref 3.5–5.1)
Sodium: 142 mmol/L (ref 136–145)
Total Bilirubin: 0.4 mg/dL (ref 0.2–1.2)
Total Protein: 6.9 g/dL (ref 6.4–8.3)

## 2017-04-28 LAB — TOTAL PROTEIN, URINE DIPSTICK: Protein, ur: 30 mg/dL — AB

## 2017-04-28 MED ORDER — SODIUM CHLORIDE 0.9 % IV SOLN
Freq: Once | INTRAVENOUS | Status: AC
  Administered 2017-04-28: 12:00:00 via INTRAVENOUS

## 2017-04-28 MED ORDER — DEXAMETHASONE SODIUM PHOSPHATE 10 MG/ML IJ SOLN
10.0000 mg | Freq: Once | INTRAMUSCULAR | Status: AC
  Administered 2017-04-28: 10 mg via INTRAVENOUS

## 2017-04-28 MED ORDER — DEXAMETHASONE SODIUM PHOSPHATE 10 MG/ML IJ SOLN
INTRAMUSCULAR | Status: AC
  Filled 2017-04-28: qty 1

## 2017-04-28 MED ORDER — ATROPINE SULFATE 1 MG/ML IJ SOLN
INTRAMUSCULAR | Status: AC
  Filled 2017-04-28: qty 1

## 2017-04-28 MED ORDER — BEVACIZUMAB CHEMO INJECTION 400 MG/16ML
5.0000 mg/kg | Freq: Once | INTRAVENOUS | Status: AC
  Administered 2017-04-28: 275 mg via INTRAVENOUS
  Filled 2017-04-28: qty 11

## 2017-04-28 MED ORDER — IRINOTECAN HCL CHEMO INJECTION 100 MG/5ML
150.0000 mg/m2 | Freq: Once | INTRAVENOUS | Status: AC
  Administered 2017-04-28: 240 mg via INTRAVENOUS
  Filled 2017-04-28: qty 12

## 2017-04-28 MED ORDER — ATROPINE SULFATE 1 MG/ML IJ SOLN
0.5000 mg | Freq: Once | INTRAMUSCULAR | Status: AC | PRN
  Administered 2017-04-28: 0.5 mg via INTRAVENOUS

## 2017-04-28 MED ORDER — PALONOSETRON HCL INJECTION 0.25 MG/5ML
INTRAVENOUS | Status: AC
  Filled 2017-04-28: qty 5

## 2017-04-28 MED ORDER — PALONOSETRON HCL INJECTION 0.25 MG/5ML
0.2500 mg | Freq: Once | INTRAVENOUS | Status: AC
  Administered 2017-04-28: 0.25 mg via INTRAVENOUS

## 2017-04-28 MED ORDER — SODIUM CHLORIDE 0.9% FLUSH
10.0000 mL | INTRAVENOUS | Status: DC | PRN
  Administered 2017-04-28: 10 mL
  Filled 2017-04-28: qty 10

## 2017-04-28 MED ORDER — HEPARIN SOD (PORK) LOCK FLUSH 100 UNIT/ML IV SOLN
500.0000 [IU] | Freq: Once | INTRAVENOUS | Status: AC | PRN
  Administered 2017-04-28: 500 [IU]
  Filled 2017-04-28: qty 5

## 2017-04-28 NOTE — Patient Instructions (Signed)
[facility] Discharge Instructions for Patients Receiving Chemotherapy  Today you received the following chemotherapy agents :  Irinotecan,  Avastin.  To help prevent nausea and vomiting after your treatment, we encourage you to take your nausea medication as prescribed.   If you develop nausea and vomiting that is not controlled by your nausea medication, call the clinic.   BELOW ARE SYMPTOMS THAT SHOULD BE REPORTED IMMEDIATELY:  *FEVER GREATER THAN 100.5 F  *CHILLS WITH OR WITHOUT FEVER  NAUSEA AND VOMITING THAT IS NOT CONTROLLED WITH YOUR NAUSEA MEDICATION  *UNUSUAL SHORTNESS OF BREATH  *UNUSUAL BRUISING OR BLEEDING  TENDERNESS IN MOUTH AND THROAT WITH OR WITHOUT PRESENCE OF ULCERS  *URINARY PROBLEMS  *BOWEL PROBLEMS  UNUSUAL RASH Items with * indicate a potential emergency and should be followed up as soon as possible.  Feel free to call the clinic should you have any questions or concerns. The clinic phone number is [phone].  Please show the CHEMO ALERT CARD at check-in to the Emergency Department and triage nurse.   

## 2017-04-28 NOTE — Telephone Encounter (Signed)
Patients daughter came by scheduling due to NO follow up appointment was made when she had her last visit

## 2017-05-03 MED FILL — AMLODIPINE BESYLATE 5 MG TA: 5 | 30 days supply | Qty: 30 | Fill #0

## 2017-05-12 ENCOUNTER — Inpatient Hospital Stay: Payer: Medicare Other

## 2017-05-12 ENCOUNTER — Telehealth: Payer: Self-pay | Admitting: Hematology

## 2017-05-12 ENCOUNTER — Inpatient Hospital Stay: Payer: Medicare Other | Attending: Hematology

## 2017-05-12 ENCOUNTER — Inpatient Hospital Stay (HOSPITAL_BASED_OUTPATIENT_CLINIC_OR_DEPARTMENT_OTHER): Payer: Medicare Other | Admitting: Oncology

## 2017-05-12 ENCOUNTER — Encounter: Payer: Self-pay | Admitting: Oncology

## 2017-05-12 VITALS — BP 156/73 | HR 86 | Temp 97.7°F | Resp 18 | Ht 65.0 in | Wt 116.8 lb

## 2017-05-12 DIAGNOSIS — C786 Secondary malignant neoplasm of retroperitoneum and peritoneum: Secondary | ICD-10-CM | POA: Insufficient documentation

## 2017-05-12 DIAGNOSIS — C182 Malignant neoplasm of ascending colon: Secondary | ICD-10-CM | POA: Diagnosis not present

## 2017-05-12 DIAGNOSIS — F0390 Unspecified dementia without behavioral disturbance: Secondary | ICD-10-CM

## 2017-05-12 DIAGNOSIS — C787 Secondary malignant neoplasm of liver and intrahepatic bile duct: Secondary | ICD-10-CM

## 2017-05-12 DIAGNOSIS — K219 Gastro-esophageal reflux disease without esophagitis: Secondary | ICD-10-CM | POA: Diagnosis not present

## 2017-05-12 DIAGNOSIS — I2510 Atherosclerotic heart disease of native coronary artery without angina pectoris: Secondary | ICD-10-CM | POA: Diagnosis not present

## 2017-05-12 DIAGNOSIS — M1990 Unspecified osteoarthritis, unspecified site: Secondary | ICD-10-CM

## 2017-05-12 DIAGNOSIS — J439 Emphysema, unspecified: Secondary | ICD-10-CM | POA: Diagnosis not present

## 2017-05-12 DIAGNOSIS — I129 Hypertensive chronic kidney disease with stage 1 through stage 4 chronic kidney disease, or unspecified chronic kidney disease: Secondary | ICD-10-CM | POA: Insufficient documentation

## 2017-05-12 DIAGNOSIS — I700 Atherosclerosis of aorta: Secondary | ICD-10-CM

## 2017-05-12 DIAGNOSIS — Z79899 Other long term (current) drug therapy: Secondary | ICD-10-CM | POA: Diagnosis not present

## 2017-05-12 DIAGNOSIS — Z7982 Long term (current) use of aspirin: Secondary | ICD-10-CM | POA: Insufficient documentation

## 2017-05-12 DIAGNOSIS — N183 Chronic kidney disease, stage 3 (moderate): Secondary | ICD-10-CM

## 2017-05-12 DIAGNOSIS — Z5111 Encounter for antineoplastic chemotherapy: Secondary | ICD-10-CM | POA: Diagnosis not present

## 2017-05-12 DIAGNOSIS — E785 Hyperlipidemia, unspecified: Secondary | ICD-10-CM | POA: Insufficient documentation

## 2017-05-12 DIAGNOSIS — Z5112 Encounter for antineoplastic immunotherapy: Secondary | ICD-10-CM | POA: Diagnosis not present

## 2017-05-12 LAB — COMPREHENSIVE METABOLIC PANEL
ALK PHOS: 70 U/L (ref 40–150)
ALT: 9 U/L (ref 0–55)
ANION GAP: 7 (ref 3–11)
AST: 15 U/L (ref 5–34)
Albumin: 3.2 g/dL — ABNORMAL LOW (ref 3.5–5.0)
BUN: 16 mg/dL (ref 7–26)
CALCIUM: 8.8 mg/dL (ref 8.4–10.4)
CHLORIDE: 111 mmol/L — AB (ref 98–109)
CO2: 24 mmol/L (ref 22–29)
Creatinine, Ser: 1.22 mg/dL — ABNORMAL HIGH (ref 0.60–1.10)
GFR calc non Af Amer: 42 mL/min — ABNORMAL LOW (ref >60)
GFR, EST AFRICAN AMERICAN: 49 mL/min — AB (ref >60)
Glucose, Bld: 86 mg/dL (ref 70–140)
Potassium: 4 mmol/L (ref 3.5–5.1)
SODIUM: 142 mmol/L (ref 136–145)
Total Bilirubin: 0.4 mg/dL (ref 0.2–1.2)
Total Protein: 6.7 g/dL (ref 6.4–8.3)

## 2017-05-12 LAB — CBC WITH DIFFERENTIAL/PLATELET
BASOS PCT: 1 %
Basophils Absolute: 0.1 10*3/uL (ref 0.0–0.1)
Eosinophils Absolute: 0.2 10*3/uL (ref 0.0–0.5)
Eosinophils Relative: 4 %
HEMATOCRIT: 32.4 % — AB (ref 34.8–46.6)
HEMOGLOBIN: 10.6 g/dL — AB (ref 11.6–15.9)
Lymphocytes Relative: 12 %
Lymphs Abs: 0.8 10*3/uL — ABNORMAL LOW (ref 0.9–3.3)
MCH: 29.6 pg (ref 25.1–34.0)
MCHC: 32.7 g/dL (ref 31.5–36.0)
MCV: 90.5 fL (ref 79.5–101.0)
MONOS PCT: 7 %
Monocytes Absolute: 0.4 10*3/uL (ref 0.1–0.9)
NEUTROS ABS: 5 10*3/uL (ref 1.5–6.5)
NEUTROS PCT: 76 %
Platelets: 320 10*3/uL (ref 145–400)
RBC: 3.58 MIL/uL — ABNORMAL LOW (ref 3.70–5.45)
RDW: 19.5 % — ABNORMAL HIGH (ref 11.2–14.5)
WBC: 6.5 10*3/uL (ref 3.9–10.3)

## 2017-05-12 LAB — TOTAL PROTEIN, URINE DIPSTICK: Protein, ur: 100 mg/dL — AB

## 2017-05-12 LAB — CEA (IN HOUSE-CHCC): CEA (CHCC-IN HOUSE): 13 ng/mL — AB (ref 0.00–5.00)

## 2017-05-12 MED ORDER — SODIUM CHLORIDE 0.9 % IV SOLN
5.0000 mg/kg | Freq: Once | INTRAVENOUS | Status: AC
  Administered 2017-05-12: 275 mg via INTRAVENOUS
  Filled 2017-05-12: qty 11

## 2017-05-12 MED ORDER — HEPARIN SOD (PORK) LOCK FLUSH 100 UNIT/ML IV SOLN
500.0000 [IU] | Freq: Once | INTRAVENOUS | Status: AC | PRN
  Administered 2017-05-12: 500 [IU]
  Filled 2017-05-12: qty 5

## 2017-05-12 MED ORDER — IRINOTECAN HCL CHEMO INJECTION 100 MG/5ML
150.0000 mg/m2 | Freq: Once | INTRAVENOUS | Status: AC
  Administered 2017-05-12: 240 mg via INTRAVENOUS
  Filled 2017-05-12: qty 12

## 2017-05-12 MED ORDER — ATROPINE SULFATE 1 MG/ML IJ SOLN
INTRAMUSCULAR | Status: AC
Start: 2017-05-12 — End: 2017-05-12
  Filled 2017-05-12: qty 1

## 2017-05-12 MED ORDER — PALONOSETRON HCL INJECTION 0.25 MG/5ML
INTRAVENOUS | Status: AC
  Filled 2017-05-12: qty 5

## 2017-05-12 MED ORDER — SODIUM CHLORIDE 0.9 % IV SOLN
Freq: Once | INTRAVENOUS | Status: AC
  Administered 2017-05-12: 10:00:00 via INTRAVENOUS

## 2017-05-12 MED ORDER — DEXAMETHASONE SODIUM PHOSPHATE 10 MG/ML IJ SOLN
INTRAMUSCULAR | Status: AC
  Filled 2017-05-12: qty 1

## 2017-05-12 MED ORDER — ATROPINE SULFATE 1 MG/ML IJ SOLN
0.5000 mg | Freq: Once | INTRAMUSCULAR | Status: AC | PRN
  Administered 2017-05-12: 0.5 mg via INTRAVENOUS

## 2017-05-12 MED ORDER — SODIUM CHLORIDE 0.9% FLUSH
10.0000 mL | INTRAVENOUS | Status: DC | PRN
  Administered 2017-05-12: 10 mL
  Filled 2017-05-12: qty 10

## 2017-05-12 MED ORDER — SODIUM CHLORIDE 0.9 % IV SOLN: Freq: Once | INTRAVENOUS | Status: AC

## 2017-05-12 MED ORDER — DEXAMETHASONE SODIUM PHOSPHATE 10 MG/ML IJ SOLN
10.0000 mg | Freq: Once | INTRAMUSCULAR | Status: AC
  Administered 2017-05-12: 10 mg via INTRAVENOUS

## 2017-05-12 MED ORDER — PALONOSETRON HCL INJECTION 0.25 MG/5ML
0.2500 mg | Freq: Once | INTRAVENOUS | Status: AC
  Administered 2017-05-12: 0.25 mg via INTRAVENOUS

## 2017-05-12 NOTE — Patient Instructions (Signed)
[facility] Discharge Instructions for Patients Receiving Chemotherapy  Today you received the following chemotherapy agents: Irinotecan (Camptosar) and Bevacizumab (Avastin).  To help prevent nausea and vomiting after your treatment, we encourage you to take your nausea medication as prescribed. Received Aloxi during treatment-->take Compazine (not Zofran) for the next 3 days.  If you develop nausea and vomiting that is not controlled by your nausea medication, call the clinic.   BELOW ARE SYMPTOMS THAT SHOULD BE REPORTED IMMEDIATELY:  *FEVER GREATER THAN 100.5 F  *CHILLS WITH OR WITHOUT FEVER  NAUSEA AND VOMITING THAT IS NOT CONTROLLED WITH YOUR NAUSEA MEDICATION  *UNUSUAL SHORTNESS OF BREATH  *UNUSUAL BRUISING OR BLEEDING  TENDERNESS IN MOUTH AND THROAT WITH OR WITHOUT PRESENCE OF ULCERS  *URINARY PROBLEMS  *BOWEL PROBLEMS  UNUSUAL RASH Items with * indicate a potential emergency and should be followed up as soon as possible.  Feel free to call the clinic should you have any questions or concerns. The clinic phone number is [phone].  Please show the [place] at check-in to the Emergency Department and triage nurse.

## 2017-05-12 NOTE — Telephone Encounter (Signed)
Appointments scheduled AVS/Calendar printed per 3/8 los

## 2017-05-12 NOTE — Progress Notes (Signed)
Per Erasmo Downer Curcio-NP: OK to treat with urine protein of 100

## 2017-05-12 NOTE — Patient Instructions (Signed)
Implanted Port Home Guide An implanted port is a type of central line that is placed under the skin. Central lines are used to provide IV access when treatment or nutrition needs to be given through a person's veins. Implanted ports are used for long-term IV access. An implanted port may be placed because:  You need IV medicine that would be irritating to the small veins in your hands or arms.  You need long-term IV medicines, such as antibiotics.  You need IV nutrition for a long period.  You need frequent blood draws for lab tests.  You need dialysis.  Implanted ports are usually placed in the chest area, but they can also be placed in the upper arm, the abdomen, or the leg. An implanted port has two main parts:  Reservoir. The reservoir is round and will appear as a small, raised area under your skin. The reservoir is the part where a needle is inserted to give medicines or draw blood.  Catheter. The catheter is a thin, flexible tube that extends from the reservoir. The catheter is placed into a large vein. Medicine that is inserted into the reservoir goes into the catheter and then into the vein.  How will I care for my incision site? Do not get the incision site wet. Bathe or shower as directed by your health care provider. How is my port accessed? Special steps must be taken to access the port:  Before the port is accessed, a numbing cream can be placed on the skin. This helps numb the skin over the port site.  Your health care provider uses a sterile technique to access the port. ? Your health care provider must put on a mask and sterile gloves. ? The skin over your port is cleaned carefully with an antiseptic and allowed to dry. ? The port is gently pinched between sterile gloves, and a needle is inserted into the port.  Only "non-coring" port needles should be used to access the port. Once the port is accessed, a blood return should be checked. This helps ensure that the port  is in the vein and is not clogged.  If your port needs to remain accessed for a constant infusion, a clear (transparent) bandage will be placed over the needle site. The bandage and needle will need to be changed every week, or as directed by your health care provider.  Keep the bandage covering the needle clean and dry. Do not get it wet. Follow your health care provider's instructions on how to take a shower or bath while the port is accessed.  If your port does not need to stay accessed, no bandage is needed over the port.  What is flushing? Flushing helps keep the port from getting clogged. Follow your health care provider's instructions on how and when to flush the port. Ports are usually flushed with saline solution or a medicine called heparin. The need for flushing will depend on how the port is used.  If the port is used for intermittent medicines or blood draws, the port will need to be flushed: ? After medicines have been given. ? After blood has been drawn. ? As part of routine maintenance.  If a constant infusion is running, the port may not need to be flushed.  How long will my port stay implanted? The port can stay in for as long as your health care provider thinks it is needed. When it is time for the port to come out, surgery will be   done to remove it. The procedure is similar to the one performed when the port was put in. When should I seek immediate medical care? When you have an implanted port, you should seek immediate medical care if:  You notice a bad smell coming from the incision site.  You have swelling, redness, or drainage at the incision site.  You have more swelling or pain at the port site or the surrounding area.  You have a fever that is not controlled with medicine.  This information is not intended to replace advice given to you by your health care provider. Make sure you discuss any questions you have with your health care provider. Document  Released: 02/21/2005 Document Revised: 07/30/2015 Document Reviewed: 10/29/2012 Elsevier Interactive Patient Education  2017 [employer] Inc.  

## 2017-05-12 NOTE — Progress Notes (Signed)
[facility] OFFICE PROGRESS NOTE  [name], MD [address] [place] [address]  DIAGNOSIS: Metastatic colon cancer  PRIOR THERAPY: 1) Terminal ileum and cecum seegmental resection with anastomosis by Dr. Hassell Done. 01/23/2015 2) Xeloda 1500 mg bid X 14 days on-7 off, s/p 5 cycles, stopped early due to wrosening renal function. 03/13/2015-07/06/2015 3) Xeloda '1500mg'$  bid, 2 weeks on and one week off, Avastin was added on from cycle 3. Xeloda held from 05/20/16 due to progression on CT from 05/19/16 and total bilirubin increased to 2.6. 11/11/2015-05/19/2016  CURRENT THERAPY: Second-Line Irinotecan and Avastin every 2 weeks started 06/03/16, Avastin held on 03/17/2017 due toproteinuria.  INTERVAL HISTORY: Beth Wilkins 77 y.o. female returns for routine follow-up visit accompanied by her daughter.  The patient is feeling fine today and has no specific complaints.  She continues to tolerate her treatment fairly well.  She denies fevers and chills.  Denies chest pain, shortness breath, cough, hemoptysis.  Denies nausea, vomiting, constipation, diarrhea.  He reports a good appetite and she has gained back some of her weight.  The patient is here for evaluation prior to her next cycle of chemotherapy.  MEDICAL HISTORY: Past Medical History:  Diagnosis Date  . Anxiety   . Aortic atherosclerosis ([place]) 03/15/2017   noted on PET   . Arthritis   . Bilateral renal cysts   . Centrilobular emphysema ([place]) per 05-10-16 chest xray  . CKD (chronic kidney disease), stage III ([place])   . Colon cancer [facility]) oncologist-  dr [name]   dx 01-23-2015  right colon Invasive adenocarcinoma into pericecal connective tissue and involving small intestine , Stage IIc, Grade 2, LVI(-),  (pT4b N0 M0) /  s/p resection termial ileum and cecum and chemotherapy 01-06-217 to 07-06-2015/   08/ 2017  per PET  recurrent w/ liver mets and peritoneal carcinomatosis  . Complication of anesthesia    can be  combative due to dementia  . Full dentures   . Headache    occ stress related  . History of chemotherapy    every other friday currently receiving  . History of gastroesophageal reflux (GERD)    changes diet  . History of pelvic fracture 2015  . Hyperlipidemia   . Hypertension   . Liver metastasis ([place])   . 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 N&V (nausea and vomiting)   . Pulmonary nodules   . Right ovarian cyst   . SBO (small bowel obstruction) ([place]) 01/2015  . 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 UTI (urinary tract infection) 09/26/2016   5 day antibiotic treatment    ALLERGIES:  is allergic to namenda [memantine hcl] and oxycodone.  MEDICATIONS:  Current Outpatient Medications  Medication Sig Dispense Refill  . amLODipine (NORVASC) 5 MG tablet Take 5 mg by mouth every morning.     Marland Kitchen aspirin 81 MG tablet Take 81 mg by mouth daily.    . Cholecalciferol (VITAMIN D3) 2000 UNITS TABS Take by mouth daily.    . Coenzyme Q10 (CO Q 10) 100 MG CAPS Take by mouth daily.    Marland Kitchen lidocaine-prilocaine (EMLA) cream Apply 1 application topically as needed. 30 g 5  . Meth-Hyo-M Bl-Na Phos-Ph Sal (URIBEL) 118 MG CAPS Take 1 capsule (118 mg total) by mouth 2 (  two) times daily as needed. 30 capsule 3  . mirabegron ER (MYRBETRIQ) 50 MG TB24 tablet Take 1 tablet (50 mg total) by mouth daily. 30 tablet 11  . mirtazapine (REMERON) 15 MG tablet TAKE 1 TABLET BY MOUTH AT BEDTIME 30 tablet 3  . Multiple Vitamins-Minerals (WOMENS MULTIVITAMIN PO) Take 1 tablet by mouth daily. With iron    . polyethylene glycol (MIRALAX / GLYCOLAX) packet Take 17 g by mouth daily. (Patient taking differently: Take 17 g by mouth daily as needed. ) 14 each 0  . prochlorperazine (COMPAZINE) 5 MG tablet Take 1 tablet (5 mg total) by  mouth every 6 (six) hours as needed for nausea or vomiting. 30 tablet 0  . QUEtiapine (SEROQUEL) 25 MG tablet Take 1 tablet (25 mg total) by mouth at bedtime. Take 1/2 tablet at night for 1 week, then increase to 1 tablet at night 30 tablet 6  . traMADol (ULTRAM) 50 MG tablet Take 1 tablet (50 mg total) by mouth every 6 (six) hours as needed. 30 tablet 0  . UNABLE TO FIND Med Name: Closys Mouth Rinse two-three times daily for ulcer.    . vitamin B-12 (CYANOCOBALAMIN) 1000 MCG tablet Take 1,000 mcg by mouth daily.    . potassium chloride SA (K-DUR,KLOR-CON) 20 MEQ tablet Take 1 tablet (20 mEq total) by mouth daily. (Patient not taking: Reported on 04/14/2017) 30 tablet 0   No current facility-administered medications for this visit.    Facility-Administered Medications Ordered in Other Visits  Medication Dose Route Frequency Provider Last Rate Last Dose  . heparin lock flush 100 unit/mL  500 Units Intravenous Once [name], MD        SURGICAL HISTORY:  Past Surgical History:  Procedure Laterality Date  . CATARACT EXTRACTION W/ INTRAOCULAR LENS  IMPLANT, BILATERAL  2005 approx  . CYSTOSCOPY W/ URETERAL STENT PLACEMENT Right 12/03/2015   Procedure: CYSTOSCOPY WITH RETROGRADE PYELOGRAM/URETERAL STENT PLACEMENT;  Surgeon: Cleon Gustin, MD;  Location: [facility];  Service: Urology;  Laterality: Right;  . CYSTOSCOPY W/ URETERAL STENT PLACEMENT Right 02/15/2016   Procedure: CYSTOSCOPY WITH RETROGRADE PYELOGRAM/URETERAL STENT REPLACEMENT;  Surgeon: Cleon Gustin, MD;  Location[facility];  Service: Urology;  Laterality: Right;  . CYSTOSCOPY W/ URETERAL STENT PLACEMENT Right 03/28/2016   Procedure: CYSTOSCOPY WITH RETROGRADE PYELOGRAM/URETERAL STENT EXCHANGE;  Surgeon: Cleon Gustin, MD;  Location[facility];  Service: Urology;  Laterality: Right;  . CYSTOSCOPY W/ URETERAL STENT PLACEMENT Right 07/18/2016   Procedure: CYSTOSCOPY WITH  RETROGRADE PYELOGRAM/ STENT EXCHANGE;  Surgeon: Cleon Gustin, MD;  Location: [facility];  Service: Urology;  Laterality: Right;  . CYSTOSCOPY W/ URETERAL STENT PLACEMENT Right 10/10/2016   Procedure: CYSTOSCOPY WITH RETROGRADE PYELOGRAM/URETERAL STENT REPLACEMENT;  Surgeon: Cleon Gustin, MD;  Location: [facility];  Service: Urology;  Laterality: Right;  . CYSTOSCOPY W/ URETERAL STENT PLACEMENT Right 04/10/2017   Procedure: CYSTOSCOPY WITH RETROGRADE PYELOGRAM/URETERAL STENT EXCHANGE;  Surgeon: Cleon Gustin, MD;  Location: [facility];  Service: Urology;  Laterality: Right;  . CYSTOSCOPY WITH RETROGRADE PYELOGRAM, URETEROSCOPY AND STENT PLACEMENT Right 09/21/2015   Procedure: CYSTOSCOPY WITH BIL RETROGRADE PYELOGRAM, URETEROSCOPY, RIGHT STENT PLACEMENT ;  Surgeon: Cleon Gustin, MD;  Location: [facility];  Service: Urology;  Laterality: Right;  . HEMICOLECTOMY Right 01/23/2015   Dr. Hassell Done  . IR FLUORO GUIDE PORT INSERTION RIGHT  03/24/2017  . IR US GUIDE VASC ACCESS RIGHT  03/24/2017  . LAPAROSCOPY N/A 01/23/2015   Procedure: DIAGNOSTIC LAPAROSCOPY, EXPLORATORY LAPAROTOMY  RIGHT HEMI-COLECTOMY FOR OBSTRUCTION OF RIGHT TERMINAL ILEUM;  Surgeon: [name], MD;  Location: [facility];  Service: General;  Laterality: N/A;    REVIEW OF SYSTEMS:   Review of Systems  Constitutional: Negative for appetite change, chills, fatigue, fever and unexpected weight change.  HENT:   Negative for mouth sores, nosebleeds, sore throat and trouble swallowing.   Eyes: Negative for eye problems and icterus.  Respiratory: Negative for cough, hemoptysis, shortness of breath and wheezing.   Cardiovascular: Negative for chest pain and leg swelling.  Gastrointestinal: Negative for abdominal pain, constipation, diarrhea, nausea and vomiting.  Genitourinary: Negative for bladder incontinence, difficulty urinating, dysuria, frequency and  hematuria.   Musculoskeletal: Negative for back pain, gait problem, neck pain and neck stiffness.  Skin: Negative for itching and rash.  Neurological: Negative for dizziness, extremity weakness, gait problem, headaches, light-headedness and seizures.  Hematological: Negative for adenopathy. Does not bruise/bleed easily.  Psychiatric/Behavioral: Negative for confusion, depression and sleep disturbance. The patient is not nervous/anxious.     PHYSICAL EXAMINATION:  Blood pressure (!) 156/73, pulse 86, temperature 97.7 F (36.5 C), temperature source Oral, resp. rate 18, height '5\' 5"'$  (1.651 m), weight 116 lb 12.8 oz (53 kg), SpO2 100 %.  ECOG PERFORMANCE STATUS: 1 - Symptomatic but completely ambulatory  Physical Exam  Constitutional: Oriented to person, place, and time and well-developed, well-nourished, and in no distress. No distress.  HENT:  Head: Normocephalic and atraumatic.  Mouth/Throat: Oropharynx is clear and moist. No oropharyngeal exudate.  Eyes: Conjunctivae are normal. Right eye exhibits no discharge. Left eye exhibits no discharge. No scleral icterus.  Neck: Normal range of motion. Neck supple.  Cardiovascular: Normal rate, regular rhythm, normal heart sounds and intact distal pulses.   Pulmonary/Chest: Effort normal and breath sounds normal. No respiratory distress. No wheezes. No rales.  Abdominal: Soft. Bowel sounds are normal. Exhibits no distension and no mass. There is no tenderness.  Musculoskeletal: Normal range of motion. Exhibits no edema.  Lymphadenopathy:    No cervical adenopathy.  Neurological: Alert and oriented to person, place, and time. Exhibits normal muscle tone. Gait normal. Coordination normal.  Skin: Skin is warm and dry. No rash noted. Not diaphoretic. No erythema. No pallor.  Psychiatric: Mood, memory and judgment normal.  Vitals reviewed.  LABORATORY DATA: Lab Results  Component Value Date   WBC 6.5 05/12/2017   HGB 10.6 (L) 05/12/2017   HCT  32.4 (L) 05/12/2017   MCV 90.5 05/12/2017   PLT 320 05/12/2017      Chemistry      Component Value Date/Time   NA 142 04/28/2017 1105   NA 142 03/03/2017 0944   K 4.1 04/28/2017 1105   K 3.7 03/03/2017 0944   CL 110 (H) 04/28/2017 1105   CO2 24 04/28/2017 1105   CO2 23 03/03/2017 0944   BUN 13 04/28/2017 1105   BUN 13.1 03/03/2017 0944   CREATININE 1.10 04/28/2017 1105   CREATININE 1.3 (H) 03/03/2017 0944      Component Value Date/Time   CALCIUM 9.3 04/28/2017 1105   CALCIUM 9.4 03/03/2017 0944   ALKPHOS 77 04/28/2017 1105   ALKPHOS 80 03/03/2017 0944   AST 18 04/28/2017 1105   AST 16 03/03/2017 0944   ALT 13 04/28/2017 1105   ALT 7 03/03/2017 0944   BILITOT 0.4 04/28/2017 1105   BILITOT 0.33 03/03/2017 0944     Results for Ojo, Zaidy  Alfonso Patten (MRN [id number]) as of 05/12/2017 09:46  Ref. Range 01/06/2017 13:06 02/03/2017 12:34 03/03/2017 09:45 03/31/2017 09:46 04/14/2017 09:59  CEA ([facility]) Latest Ref Range: 0.00 - 5.00 ng/mL 10.36 (H) 12.60 (H) 9.96 (H)  11.93 (H)    RADIOGRAPHIC STUDIES:  No results found.   ASSESSMENT/PLAN:  77 y.o.African-American female, with past medical history of moderate dementia, independent with ADLs, hypertension, presents with small bowel obstruction and weight loss.  1. 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 Raska appears stable today. She completed another cycle of irinotecan/avastin on 04/28/17; she continues to tolerate well.  She denies any issues with diarrhea. VS and weight stable. Labs reviewed, CBC and urine protein are stable and adequate to proceed with irinotecan/avastin today.  CMP is pending. Continue q2 weeks. Return in 2 weeks for next cycle, f/u in 4 weeks   2.  CKD stage III, right hydroureter S/p right ureteral stent exchange on 2/4; she tolerated procedure well -Cr.  Was normal on 04/28/2017.  Pending today. -continue  f/u with renal  3.  HTN, dementia -Dementia is stable, on seroquel -BP has been well controlled lately, on amlodipine  4. Nausea and diarrhea -Her nausea is resolved -No diarrhea reported today. -She may use Imodium as needed if diarrhea develops.  5. Goals of care  -Patient and family agree to continue this regimen while patient tolerates it; she is tolerating well   6. Insomnia -Her daughter reports she is sleeping well lately  7. Social support -She is well cared for by her daughter and family, she has a "companion" who visits her often and encourages her to exercise  PLAN -Labs reviewed, proceed with irinotecan/avastin today and continue q2 weeks -Return for next cycle in 2 weeks, f/u visit with chemotherapy in 4 weeks   All questions were answered. The patient knows to call the clinic with any problems, questions or concerns. No barriers to learning was detected.  No orders of the defined types were placed in this encounter.  Mikey Bussing, DNP, AGPCNP-BC, AOCNP 05/12/17

## 2017-05-15 ENCOUNTER — Other Ambulatory Visit: Payer: Self-pay | Admitting: Neurology

## 2017-05-15 MED FILL — MIRTAZAPINE 15 MG TABLET: 15 | 30 days supply | Qty: 30 | Fill #0

## 2017-05-15 MED FILL — QUETIAPINE 25 MG TABLET: 25 | 30 days supply | Qty: 30 | Fill #4

## 2017-05-15 MED FILL — MYRBETRIQ ER 50 MG TABLET: 50 | 30 days supply | Qty: 30 | Fill #1

## 2017-05-26 ENCOUNTER — Inpatient Hospital Stay: Payer: Medicare Other

## 2017-05-26 VITALS — BP 156/89 | HR 80 | Temp 97.5°F | Resp 18

## 2017-05-26 DIAGNOSIS — C182 Malignant neoplasm of ascending colon: Secondary | ICD-10-CM

## 2017-05-26 LAB — COMPREHENSIVE METABOLIC PANEL
ALBUMIN: 3.3 g/dL — AB (ref 3.5–5.0)
ALT: 8 U/L (ref 0–55)
AST: 15 U/L (ref 5–34)
Alkaline Phosphatase: 72 U/L (ref 40–150)
Anion gap: 8 (ref 3–11)
BUN: 14 mg/dL (ref 7–26)
CHLORIDE: 109 mmol/L (ref 98–109)
CO2: 25 mmol/L (ref 22–29)
Calcium: 9.1 mg/dL (ref 8.4–10.4)
Creatinine, Ser: 1.16 mg/dL — ABNORMAL HIGH (ref 0.60–1.10)
GFR calc Af Amer: 52 mL/min — ABNORMAL LOW (ref >60)
GFR, EST NON AFRICAN AMERICAN: 45 mL/min — AB (ref >60)
GLUCOSE: 83 mg/dL (ref 70–140)
POTASSIUM: 4 mmol/L (ref 3.5–5.1)
SODIUM: 142 mmol/L (ref 136–145)
Total Bilirubin: 0.4 mg/dL (ref 0.2–1.2)
Total Protein: 6.9 g/dL (ref 6.4–8.3)

## 2017-05-26 LAB — CBC WITH DIFFERENTIAL/PLATELET
Basophils Absolute: 0 10*3/uL (ref 0.0–0.1)
Basophils Relative: 1 %
Eosinophils Absolute: 0.3 10*3/uL (ref 0.0–0.5)
Eosinophils Relative: 5 %
HCT: 33.2 % — ABNORMAL LOW (ref 34.8–46.6)
Hemoglobin: 10.9 g/dL — ABNORMAL LOW (ref 11.6–15.9)
LYMPHS ABS: 1.2 10*3/uL (ref 0.9–3.3)
LYMPHS PCT: 19 %
MCH: 29.4 pg (ref 25.1–34.0)
MCHC: 32.8 g/dL (ref 31.5–36.0)
MCV: 89.8 fL (ref 79.5–101.0)
MONOS PCT: 9 %
Monocytes Absolute: 0.6 10*3/uL (ref 0.1–0.9)
Neutro Abs: 4.3 10*3/uL (ref 1.5–6.5)
Neutrophils Relative %: 66 %
Platelets: 328 10*3/uL (ref 145–400)
RBC: 3.7 MIL/uL (ref 3.70–5.45)
RDW: 19.2 % — ABNORMAL HIGH (ref 11.2–14.5)
WBC: 6.5 10*3/uL (ref 3.9–10.3)

## 2017-05-26 LAB — TOTAL PROTEIN, URINE DIPSTICK: Protein, ur: 30 mg/dL — AB

## 2017-05-26 MED ORDER — SODIUM CHLORIDE 0.9% FLUSH
10.0000 mL | Freq: Once | INTRAVENOUS | Status: AC
  Administered 2017-05-26: 10 mL
  Filled 2017-05-26: qty 10

## 2017-05-26 MED ORDER — SODIUM CHLORIDE 0.9 % IV SOLN: Freq: Once | INTRAVENOUS | Status: AC

## 2017-05-26 MED ORDER — HEPARIN SOD (PORK) LOCK FLUSH 100 UNIT/ML IV SOLN
500.0000 [IU] | Freq: Once | INTRAVENOUS | Status: AC | PRN
  Administered 2017-05-26: 500 [IU]
  Filled 2017-05-26: qty 5

## 2017-05-26 MED ORDER — DEXAMETHASONE SODIUM PHOSPHATE 10 MG/ML IJ SOLN
10.0000 mg | Freq: Once | INTRAMUSCULAR | Status: AC
  Administered 2017-05-26: 10 mg via INTRAVENOUS

## 2017-05-26 MED ORDER — ATROPINE SULFATE 1 MG/ML IJ SOLN
INTRAMUSCULAR | Status: AC
  Filled 2017-05-26: qty 1

## 2017-05-26 MED ORDER — ATROPINE SULFATE 1 MG/ML IJ SOLN
0.5000 mg | Freq: Once | INTRAMUSCULAR | Status: AC | PRN
  Administered 2017-05-26: 0.5 mg via INTRAVENOUS

## 2017-05-26 MED ORDER — DEXAMETHASONE SODIUM PHOSPHATE 10 MG/ML IJ SOLN
INTRAMUSCULAR | Status: AC
  Filled 2017-05-26: qty 1

## 2017-05-26 MED ORDER — PALONOSETRON HCL INJECTION 0.25 MG/5ML
0.2500 mg | Freq: Once | INTRAVENOUS | Status: AC
  Administered 2017-05-26: 0.25 mg via INTRAVENOUS

## 2017-05-26 MED ORDER — IRINOTECAN HCL CHEMO INJECTION 100 MG/5ML
150.0000 mg/m2 | Freq: Once | INTRAVENOUS | Status: AC
  Administered 2017-05-26: 240 mg via INTRAVENOUS
  Filled 2017-05-26: qty 12

## 2017-05-26 MED ORDER — SODIUM CHLORIDE 0.9 % IV SOLN
5.0000 mg/kg | Freq: Once | INTRAVENOUS | Status: AC
  Administered 2017-05-26: 275 mg via INTRAVENOUS
  Filled 2017-05-26: qty 11

## 2017-05-26 MED ORDER — SODIUM CHLORIDE 0.9 % IV SOLN
Freq: Once | INTRAVENOUS | Status: AC
  Administered 2017-05-26: 14:00:00 via INTRAVENOUS

## 2017-05-26 MED ORDER — SODIUM CHLORIDE 0.9% FLUSH
10.0000 mL | INTRAVENOUS | Status: DC | PRN
  Administered 2017-05-26: 10 mL
  Filled 2017-05-26: qty 10

## 2017-05-26 MED ORDER — PALONOSETRON HCL INJECTION 0.25 MG/5ML
INTRAVENOUS | Status: AC
Start: 2017-05-26 — End: 2017-05-26
  Filled 2017-05-26: qty 5

## 2017-05-26 NOTE — Patient Instructions (Signed)
[facility] Discharge Instructions for Patients Receiving Chemotherapy  Today you received the following chemotherapy agents: irinotecan (Camptosar) and Bevacizumab (Avastin).  To help prevent nausea and vomiting after your treatment, we encourage you to take your nausea medication as prescribed. Received Aloxi during treatment-->take Compazine (not Zofran) for the next 3 days.  If you develop nausea and vomiting that is not controlled by your nausea medication, call the clinic.   BELOW ARE SYMPTOMS THAT SHOULD BE REPORTED IMMEDIATELY:  *FEVER GREATER THAN 100.5 F  *CHILLS WITH OR WITHOUT FEVER  NAUSEA AND VOMITING THAT IS NOT CONTROLLED WITH YOUR NAUSEA MEDICATION  *UNUSUAL SHORTNESS OF BREATH  *UNUSUAL BRUISING OR BLEEDING  TENDERNESS IN MOUTH AND THROAT WITH OR WITHOUT PRESENCE OF ULCERS  *URINARY PROBLEMS  *BOWEL PROBLEMS  UNUSUAL RASH Items with * indicate a potential emergency and should be followed up as soon as possible.  Feel free to call the clinic should you have any questions or concerns. The clinic phone number is [phone].  Please show the [facility] at check-in to the Emergency Department and triage nurse.

## 2017-06-02 MED FILL — AMLODIPINE BESYLATE 5 MG TA: 5 | 30 days supply | Qty: 30 | Fill #0

## 2017-06-07 NOTE — Progress Notes (Signed)
[place]  [phone] [phone]  Clinic Follow Up Note   Patient Care Team: [name], MD as PCP - General (Family Medicine) [name], MD as Consulting Physician (Medical Oncology) [name], MD as Consulting Physician (General Surgery) [name], MD as Consulting Physician (Orthopedic Surgery) [name] [name], MD as Consulting Physician (Urology) 06/09/2017     CHIEF COMPLAINTS:  Follow up metastatic colon cancer  Oncology History   Cancer of right colon [facility])   Staging form: Colon and Rectum, AJCC 7th Edition     Pathologic stage from 01/23/2015: Stage IIC (T4b, N0, cM0) - Signed by [name], MD on 02/05/2015       Cancer of right colon ([place])   01/21/2015 Imaging     CT abdomen and pelvis showed high-grade small bowel obstruction,  right I [place] ureteral nephrosis,  multiple liver cysts.  CT chest was negative for metastatic lesion.      01/23/2015 Pathology Results     invasive adenocarcinoma extending into the pericecal connective tissue,  grade 2, LVI (-),  no perineural invasion, 3 lymph nodes were negative.      01/23/2015 Surgery      terminal ileum and cecum seegmental resection with anastomosis by Dr. Hassell Done      01/23/2015 Initial Diagnosis    Cancer of right colon ([place])      03/13/2015 - 07/06/2015 Chemotherapy    Xeloda 1500 mg bid X 14 days on-7 off, s/p 5 cycles, stopped early due to wrosening renal function      10/28/2015 Progression    PET scan showed multiple hypermetabolic peritoneal nodules, right ovarian cystic mass, and hypermetabolic liver lesion, highly suspicious for metastatic colon cancer. Small pulmonary nodules are indeterminate.       11/11/2015 - 05/20/2016 Chemotherapy    Xeloda 1549m bid, 2 weeks on and one week off, Avastin was added on from cycle 3. Xeloda held from 05/20/16 due to progression on CT from 05/19/16 and total bilirubin increased to 2.6.      01/08/2016 Imaging   Ct chest, Abdomen Pelvis Wo Contrast 01/08/2016  IMPRESSION: 1. 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 2. Hepatic and renal cysts. 3.  Aortoiliac atherosclerotic vascular disease. 4. Lumbar spondylosis and degenerative disc disease with multilevel Impingement.      05/19/2016 Imaging    CT CAP w Contrast IMPRESSION: 1. Numerous small solid pulmonary nodules, one of which is new, and several of which have mildly increased in size since 10/28/2015, worrisome for mild progression of pulmonary metastases. 2. Solitary right liver lobe metastasis is mildly increased in size since 01/08/2016. 3. Peritoneal metastases have mildly increased in size since 01/08/2016, largest in the right pelvic sidewall. 4. Well-positioned right nephroureteral stent without overt right hydronephrosis. 5. Additional findings include aortic atherosclerosis, 1 vessel coronary atherosclerosis and mild to moderate emphysema.      06/03/2016 -  Chemotherapy    Second-Line Irinotecan and Avastin every 2 weeks starting 06/03/16      07/29/2016 Tumor Marker    CEA 6.51       08/09/2016 Imaging    PET  IMPRESSION: 1. Overall mild improvement compared to FDG PET scan of 10/28/2015. 2. Pulmonary nodules are increased in size from PET-CT scan and similar size to most recent CT scan. Largest lesion RIGHT lower lobe does have associated faint metabolic activity consistent with pulmonary metastasis. 3. Decrease in metabolic activity of RIGHT hepatic lobe lesion. Potential  second lesion LEFT hepatic lobe with slightly lower metabolic activity. 4. Decreased metabolic activity at the enteric colonic anastomosis in the RIGHT lower quadrant. 5. Interval decrease in metabolic activity of peritoneal nodular metastasis. 6. Nodular implant implant along ventral abdominal wall may be within the musculature, but also improved.      09/09/2016 Tumor Marker     CEA: 7.47      10/07/2016 Tumor Marker    CEA: 7.85      11/04/2016 Tumor Marker    CEA: 7.75      11/25/2016 PET scan    PET 11/25/16 IMPRESSION: 1. Interval worsening in the interval. Numerous pulmonary nodules are again seen. While multiple nodules are stable, there are several nodules which are a little larger in the interval. They remain too small to completely characterize with PET-CT. The 2 hepatic metastases demonstrate increased FDG uptake on today's study compared to the previous study. Abdominal wall and peritoneal metastases are mildly worsened.      12/02/2016 Tumor Marker    CEA: 8.13      01/06/2017 Tumor Marker    CEA: 10.36      02/03/2017 Tumor Marker    CEA: 12.60      03/03/2017 Tumor Marker    CEA: 9.96      03/13/2017 PET scan    IMPRESSION: 1. Hepatic metastases are stable in size with stable to minimally decreased hypermetabolism. 2. Inferior left rectus abdominus metastasis shows mildly decreased hypermetabolism. 3. Omental nodule no longer shows abnormal hypermetabolism. 4. Scattered pulmonary nodules are stable in size and too small for PET resolution. 5.  Aortic atherosclerosis (ICD10-170.0). 6.  Emphysema (ICD10-J43.9).       HISTORY OF PRESENTING ILLNESS (02/05/2015):  Beth Wilkins 77 y.o. female  with past medical history of moderate dementia, hypertension, is here because of recently diagnosed stage II colon cancer. She is accompanied to the clinic by her daughter. History is mainly obtained from the chart and her daughter.  She has had abdominal discomfort, nausea and vomiting, and anorexia for  the past to 3 months along with 15 pounds weight loss, . Her nausea was mild and intermittent, she does not seek medical attention initially. She presented with  worsening symptoms for 2-3 weeks, and episodes of projectile vomiting and dehydration to emergency room, was admitted to [facility] on 01/20/2015. CT scan reviewed small bowel  obstruction, and possible appendiceal rupture. She was brought to OR on 01/23/2015, underwent right hemicolectomy with anastomosis by Dr. Hassell Done. Surgical path reviewed adenocarcinoma at the cecum. She was discharged home on 01/26/2015.  She has recovered well, eating much better than prior to surgery, move around without restriction. No pain, she has loose BM 3 times a day, and she has backed off her miralax.  she lives with her daughter. She states her appetite is moderate, sometime low, and she refuses to eat if she doesn't have appetite. No nausea, vomiting, abdominal bloating since her surgery.  Her last colonoscopy was 6-8 years ago, and her stool OB was negative this year.   CURRENT THERAPY: Second-Line Irinotecan and Avastin every 2 weeks started 06/03/16, Avastin held on 03/17/2017 due to proteinuria  INTERIM HISTORY:   IRIA JAMERSON is here for a follow up and Irinotecan/Avastin. She presents to the clinic today accompanied by her daughter. She reports she is doing well overall. She has been tolerating treatment. Her daughter reports that she has someone coming to the house 2 days a week to spend time  with Tamela Oddi and making sure she is drinking enough water and takes her out. She notes to have a good appetite. She is using Melatonin for sleep. She reports mild abdominal discomfort occasionally from constipation.   On review of systems, pt denies any other complaints at this time. Pertinent positives are listed and detailed within the above HPI.   MEDICAL HISTORY:  Past Medical History:  Diagnosis Date  . Anxiety   . Aortic atherosclerosis ([place]) 03/15/2017   noted on PET   . Arthritis   . Bilateral renal cysts   . Centrilobular emphysema ([place]) per 05-10-16 chest xray  . CKD (chronic kidney disease), stage III ([place])   . Colon cancer [facility] oncologist-  dr [name]   dx 01-23-2015  right colon Invasive adenocarcinoma into pericecal connective tissue and involving small intestine , Stage  IIc, Grade 2, LVI(-),  (pT4b N0 M0) /  s/p resection termial ileum and cecum and chemotherapy 01-06-217 to 07-06-2015/   08/ 2017  per PET  recurrent w/ liver mets and peritoneal carcinomatosis  . Complication of anesthesia    can be combative due to dementia  . Full dentures   . Headache    occ stress related  . History of chemotherapy    every other friday currently receiving  . History of gastroesophageal reflux (GERD)    changes diet  . History of pelvic fracture 2015  . Hyperlipidemia   . Hypertension   . Liver metastasis ([place])   . 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 N&V (nausea and vomiting)   . Pulmonary nodules   . Right ovarian cyst   . SBO (small bowel obstruction) ([place]) 01/2015  . 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 UTI (urinary tract infection) 09/26/2016   5 day antibiotic treatment    SURGICAL HISTORY: Past Surgical History:  Procedure Laterality Date  . CATARACT EXTRACTION W/ INTRAOCULAR LENS  IMPLANT, BILATERAL  2005 approx  . CYSTOSCOPY W/ URETERAL STENT PLACEMENT Right 12/03/2015   Procedure: CYSTOSCOPY WITH RETROGRADE PYELOGRAM/URETERAL STENT PLACEMENT;  Surgeon: Cleon Gustin, MD;  Location: [facility];  Service: Urology;  Laterality: Right;  . CYSTOSCOPY W/ URETERAL STENT PLACEMENT Right 02/15/2016   Procedure: CYSTOSCOPY WITH RETROGRADE PYELOGRAM/URETERAL STENT REPLACEMENT;  Surgeon: Cleon Gustin, MD;  Location: [facility];  Service: Urology;  Laterality: Right;  . CYSTOSCOPY W/ URETERAL STENT PLACEMENT Right 03/28/2016   Procedure: CYSTOSCOPY WITH RETROGRADE PYELOGRAM/URETERAL STENT EXCHANGE;  Surgeon: Cleon Gustin, MD;  Location: [facility];  Service: Urology;  Laterality: Right;  . CYSTOSCOPY W/  URETERAL STENT PLACEMENT Right 07/18/2016   Procedure: CYSTOSCOPY WITH RETROGRADE PYELOGRAM/ STENT EXCHANGE;  Surgeon: Cleon Gustin, MD;  Location: [facility];  Service: Urology;  Laterality: Right;  . CYSTOSCOPY W/ URETERAL STENT PLACEMENT Right 10/10/2016   Procedure: CYSTOSCOPY WITH RETROGRADE PYELOGRAM/URETERAL STENT REPLACEMENT;  Surgeon: Cleon Gustin, MD;  Location: [facility];  Service: Urology;  Laterality: Right;  . CYSTOSCOPY W/ URETERAL STENT PLACEMENT Right 04/10/2017   Procedure: CYSTOSCOPY WITH RETROGRADE PYELOGRAM/URETERAL STENT EXCHANGE;  Surgeon: Cleon Gustin, MD;  Location: [facility];  Service: Urology;  Laterality: Right;  . CYSTOSCOPY WITH RETROGRADE PYELOGRAM, URETEROSCOPY AND STENT PLACEMENT Right 09/21/2015   Procedure: CYSTOSCOPY WITH BIL RETROGRADE  PYELOGRAM, URETEROSCOPY, RIGHT STENT PLACEMENT ;  Surgeon: Cleon Gustin, MD;  Location: [facility];  Service: Urology;  Laterality: Right;  . HEMICOLECTOMY Right 01/23/2015   Dr. Hassell Done  . IR FLUORO GUIDE PORT INSERTION RIGHT  03/24/2017  . IR US GUIDE VASC ACCESS RIGHT  03/24/2017  . LAPAROSCOPY N/A 01/23/2015   Procedure: DIAGNOSTIC LAPAROSCOPY, EXPLORATORY LAPAROTOMY  RIGHT HEMI-COLECTOMY FOR OBSTRUCTION OF RIGHT TERMINAL ILEUM;  Surgeon: [name], MD;  Location: [facility];  Service: General;  Laterality: N/A;    SOCIAL HISTORY: Social History   Socioeconomic History  . Marital status: Single    Spouse name: Not on file  . Number of children: Not on file  . Years of education: Not on file  . Highest education level: Not on file  Occupational History  . Not on file  Social Needs  . Financial resource strain: Not on file  . Food insecurity:    Worry: Not on file    Inability: Not on file  . Transportation needs:    Medical: Not on file    Non-medical: Not on file  Tobacco Use  . Smoking status: Former Smoker    Packs/day:  0.25    Years: 50.00    Pack years: 12.50    Types: Cigarettes    Last attempt to quit: 01/25/2015    Years since quitting: 2.3  . Smokeless tobacco: Never Used  Substance and Sexual Activity  . Alcohol use: Yes    Alcohol/week: 1.8 oz    Types: 2 Glasses of wine, 1 Cans of beer per week    Comment: occasional   . Drug use: No  . Sexual activity: Not on file  Lifestyle  . Physical activity:    Days per week: Not on file    Minutes per session: Not on file  . Stress: Not on file  Relationships  . Social connections:    Talks on phone: Not on file    Gets together: Not on file    Attends religious service: Not on file    Active member of club or organization: Not on file    Attends meetings of clubs or organizations: Not on file    Relationship status: Not on file  . Intimate partner violence:    Fear of current or ex partner: Not on file    Emotionally abused: Not on file    Physically abused: Not on file    Forced sexual activity: Not on file  Other Topics Concern  . Not on file  Social History Narrative   Legally separated   Lives w/daughter, Vantasia Pinkney   Has total of #2 daughters and #1 son   Dementia   Fall Risk-ambulates w/cane    FAMILY HISTORY: Family History  Problem Relation Age of Onset  . Diabetes Mellitus II Father   . Cancer Mother 19       ovarian cancer   . Cervical cancer Other     ALLERGIES:  is allergic to namenda [memantine hcl] and oxycodone.  MEDICATIONS:  Current Outpatient Medications  Medication Sig Dispense Refill  . amLODipine (NORVASC) 5 MG tablet Take 5 mg by mouth every morning.     Marland Kitchen aspirin 81 MG tablet Take 81 mg by mouth daily.    . Cholecalciferol (VITAMIN D3) 2000 UNITS TABS Take by mouth daily.    . Coenzyme Q10 (CO Q 10) 100 MG CAPS Take by mouth daily.    Marland Kitchen lidocaine-prilocaine (EMLA) cream Apply  1 application topically as needed. 30 g 5  . Meth-Hyo-M Bl-Na Phos-Ph Sal (URIBEL) 118 MG CAPS Take 1 capsule (118 mg  total) by mouth 2 (two) times daily as needed. 30 capsule 3  . mirabegron ER (MYRBETRIQ) 50 MG TB24 tablet Take 1 tablet (50 mg total) by mouth daily. 30 tablet 11  . mirtazapine (REMERON) 15 MG tablet TAKE 1 TABLET BY MOUTH AT BEDTIME 30 tablet 3  . Multiple Vitamins-Minerals (WOMENS MULTIVITAMIN PO) Take 1 tablet by mouth daily. With iron    . polyethylene glycol (MIRALAX / GLYCOLAX) packet Take 17 g by mouth daily. (Patient taking differently: Take 17 g by mouth daily as needed. ) 14 each 0  . prochlorperazine (COMPAZINE) 5 MG tablet Take 1 tablet (5 mg total) by mouth every 6 (six) hours as needed for nausea or vomiting. 30 tablet 0  . QUEtiapine (SEROQUEL) 25 MG tablet Take 1 tablet (25 mg total) by mouth at bedtime. Take 1/2 tablet at night for 1 week, then increase to 1 tablet at night 30 tablet 6  . traMADol (ULTRAM) 50 MG tablet Take 1 tablet (50 mg total) by mouth every 6 (six) hours as needed. 30 tablet 0  . UNABLE TO FIND Med Name: Closys Mouth Rinse two-three times daily for ulcer.    . vitamin B-12 (CYANOCOBALAMIN) 1000 MCG tablet Take 1,000 mcg by mouth daily.     No current facility-administered medications for this visit.    Facility-Administered Medications Ordered in Other Visits  Medication Dose Route Frequency Provider Last Rate Last Dose  . heparin lock flush 100 unit/mL  500 Units Intravenous Once [name], MD        REVIEW OF SYSTEMS:  Constitutional: Denies fevers, chills or abnormal night sweats (+) Improved appetite (+) good appetite Eyes: Denies blurriness of vision, double vision or watery eyes  Ears, nose, mouth, throat, and face: Denies mucositis or sore throat  Respiratory: Denies cough, dyspnea or wheezes Cardiovascular: Denies palpitation, chest discomfort or lower extremity swelling Gastrointestinal:  Denies heartburn (+) constipation  Urinary:  Denies Dysuria  Skin: Denies abnormal skin rashes Lymphatics: Denies new lymphadenopathy or easy  bruising Neurological:Denies numbness, tingling or new weaknesses Behavioral/Psych: Mood is stable, no new changes (+) Dementia All other systems were reviewed with the patient and are negative.  PHYSICAL EXAMINATION:  ECOG PERFORMANCE STATUS: 1 - Symptomatic but completely ambulatory  Vitals:   06/09/17 0957  BP: (!) 152/81  Pulse: 92  Resp: 16  Temp: 97.7 F (36.5 C)  SpO2: 100%   Filed Weights   06/09/17 0957  Weight: 117 lb 9.6 oz (53.3 kg)     GENERAL:alert, no distress and comfortable SKIN: skin color, texture, turgor are normal, no rashes or significant lesions EYES: normal, conjunctiva are pink and non-injected, sclera clear (+) difficult to evaluate due to her dementia OROPHARYNX:no exudate (+) Upper and lower dentures  (+) slight color change of tongue  NECK: supple, thyroid normal size, non-tender, without nodularity LYMPH:  no palpable lymphadenopathy in the cervical, axillary or inguinal LUNGS: clear to auscultation and percussion with normal breathing effort HEART: regular rate & rhythm and no murmurs and no lower extremity edema ABDOMEN:abdomen soft, non-tender and normal bowel sounds  Musculoskeletal:no cyanosis of digits and no clubbing  PSYCH: alert and with fluent speech, (+) dementia  NEURO: no focal motor/sensory deficits  LABORATORY DATA:  I have reviewed the data as listed CBC Latest Ref Rng & Units 06/09/2017 05/26/2017 05/12/2017  WBC 3.9 -  10.3 K/uL 6.9 6.5 6.5  Hemoglobin 11.6 - 15.9 g/dL 11.1(L) 10.9(L) 10.6(L)  Hematocrit 34.8 - 46.6 % 34.0(L) 33.2(L) 32.4(L)  Platelets 145 - 400 K/uL 324 328 320    CMP Latest Ref Rng & Units 06/09/2017 05/26/2017 05/12/2017  Glucose 70 - 140 mg/dL 110 83 86  BUN 7 - 26 mg/dL _0 Creatinine 0.60 - 1.10 mg/dL 1.25(H) 1.16(H) 1.22(H)  Sodium 136 - 145 mmol/L 140 142 142  Potassium 3.5 - 5.1 mmol/L 3.8 4.0 4.0  Chloride 98 - 109 mmol/L 109 109 111(H)  CO2 22 - 29 mmol/L _1 Calcium 8.4 - 10.4 mg/dL 9.1  9.1 8.8  Total Protein 6.4 - 8.3 g/dL 6.9 6.9 6.7  Total Bilirubin 0.2 - 1.2 mg/dL 0.3 0.4 0.4  Alkaline Phos 40 - 150 U/L 73 72 70  AST 5 - 34 U/L _2 ALT 0 - 55 U/L _3 CEA  01/23/2015: 5.7 06/05/2015: 6.1 08/04/2015: 10.3 10/12/2015: 13.29 (in-house)  11/04/2015: 12.83 01/13/2016: 8.57 02/24/2016: 8.41 04/27/2016: 9.22 05/20/2016: 8.32 06/17/2016: 6.45 07/15/2016: 5.07 07/29/2016: 6.51 09/09/16: 7.47 10/07/16: 7.85 11/04/16: 7.75 12/02/16: 8.13 01/06/17: 10.36 02/03/17: 12.60 03/03/18: 9.96  03/31/17: 12.7 04/14/17: 11.93 05/12/17: 13.00 06/09/17: Pending    PATHOLOGY REPORT: Diagnosis 01/23/2015 Colon, segmental resection, with terminal ileum and cecum - INVASIVE ADENOCARCINOMA EXTENDING INTO PERICECAL CONNECTIVE TISSUE AND INVOLVING SMALL INTESTINE. - THREE BENIGN LYMPH NODES (0/3). - SEE ONCOLOGY TABLE BELOW.  Microscopic Comment COLON AND RECTUM (INCLUDING TRANS-ANAL RESECTION): Specimen: Terminal ileum and cecum. Procedure: Resection. Tumor site: Cecum adjacent to appendix. Specimen integrity: Intact. Macroscopic intactness of mesorectum: Not applicable. Macroscopic tumor perforation: No. Invasive tumor: Maximum size: 4.0 cm. Histologic type(s): Colorectal adenocarcinoma. Histologic grade and differentiation: G2: moderately differentiated/low grade. Type of polyp in which invasive carcinoma arose: No residual polyp. Microscopic extension of invasive tumor: Into pericecal connective tissue and terminal ileum. Lymph-Vascular invasion: Not identified. Peri-neural invasion: Not identified. Tumor deposit(s) (discontinuous extramural extension): No. Resection margins: Proximal margin: Free of tumor. Distal margin: Free of tumor. Circumferential (radial) (posterior ascending, posterior descending; lateral and posterior mid-rectum; and entire lower 1/3 rectum): Free of tumor. Mesenteric margin (sigmoid and transverse): N/A. Distance closest margin (if all above  margins negative): N/A. Treatment effect (neo-adjuvant therapy): No. Additional polyp(s): No. Non-neoplastic findings: N/A. Lymph nodes: number examined 3; number positive: 0. Pathologic Staging: pT4b, pN0, pMX. Ancillary studies: Mismatch repair protein by immunohistochemistry pending. Comments: There is a colorectal-type adenocarcinoma arising in the cecum in the area of the appendiceal orifice. The tumor extends into the pericecal connective tissue, and involves the attached segment of terminal ileum. Immunohistochemistry shows the tumor is positive with CDX2 and cytokeratin 20, and negative with cytokeratin 7, estrogen receptor and WT-1 supporting a diagnosis of primary colorectal adenocarcinoma. ADDITIONAL INFORMATION: Mismatch Repair (MMR) Protein Immunohistochemistry (IHC) IHC Expression Result: MLH1: Preserved nuclear expression (greater 50% tumor expression) MSH2: Preserved nuclear expression (greater 50% tumor expression) MSH6: Preserved nuclear expression (greater 50% tumor expression) PMS2: Preserved nuclear expression (greater 50% tumor expression) * Internal control demonstrates intact nuclear expression Interpretation: NORMAL There is preserved expression of the major and minor MMR proteins. There is a very low probability that microsatellite instability (MSI) is present. However, certain clinically significant MMR protein mutations may result in preservation of nuclear expression. It is recommended that the preservation of protein expression be correlated with molecular based MSI testing.    RADIOGRAPHIC STUDIES: I have personally reviewed the  radiological images as listed and agreed with the findings in the report.  PET 03/13/17 IMPRESSION: 1. Hepatic metastases are stable in size with stable to minimally decreased hypermetabolism. 2. Inferior left rectus abdominus metastasis shows mildly decreased hypermetabolism. 3. Omental nodule no longer shows abnormal  hypermetabolism. 4. Scattered pulmonary nodules are stable in size and too small for PET resolution. 5.  Aortic atherosclerosis (ICD10-170.0). 6.  Emphysema (ICD10-J43.9).  PET 11/25/16 IMPRESSION: 1. Interval worsening in the interval. Numerous pulmonary nodules are again seen. While multiple nodules are stable, there are several nodules which are a little larger in the interval. They remain too small to completely characterize with PET-CT. The 2 hepatic metastases demonstrate increased FDG uptake on today's study compared to the previous study. Abdominal wall and peritoneal metastases are mildly worsened.  PET 08/09/2016 IMPRESSION: 1. Overall mild improvement compared to FDG PET scan of 10/28/2015. 2. Pulmonary nodules are increased in size from PET-CT scan and similar size to most recent CT scan. Largest lesion RIGHT lower lobe does have associated faint metabolic activity consistent with pulmonary metastasis. 3. Decrease in metabolic activity of RIGHT hepatic lobe lesion. Potential second lesion LEFT hepatic lobe with slightly lower metabolic activity. 4. Decreased metabolic activity at the enteric colonic anastomosis in the RIGHT lower quadrant. 5. Interval decrease in metabolic activity of peritoneal nodular metastasis. 6. Nodular implant along ventral abdominal wall may be within the musculature, but also improved.  CT CAP w Contrast 05/19/2016 IMPRESSION: 1. Numerous small solid pulmonary nodules, one of which is new, and several of which have mildly increased in size since 10/28/2015, worrisome for mild progression of pulmonary metastases. 2. Solitary right liver lobe metastasis is mildly increased in sizesince 01/08/2016. 3. Peritoneal metastases have mildly increased in size since 01/08/2016, largest in the right pelvic sidewall. 4. Well-positioned right nephroureteral stent without overt right hydronephrosis. 5. Additional findings include aortic atherosclerosis, 1 vessel coronary  atherosclerosis and mild to moderate emphysema.  Ct chest, Abdomen Pelvis Wo Contrast 01/08/2016  IMPRESSION: 1. 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 2. Hepatic and renal cysts. 3.  Aortoiliac atherosclerotic vascular disease. 4. Lumbar spondylosis and degenerative disc disease with multilevel impingement.  ASSESSMENT & PLAN: 77 y.o. African-American female, with past medical history of moderate dementia, independent with ADLs, hypertension, presents with small bowel obstruction and weight loss.  1. 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 -I personally reviewed her recent PET scan images, unfortunately the scan showed hypermetabolic peritoneal and liver metastases. 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 The tumor has MSI stable, she is not a candidate for immunotherapy, although she may be a candidate for clinical trial of immunotherapy plus MEK inhibitor at [facility]. -She started Xeloda '1500mg'$  bid, 2 weeks on and one week off on 11/11/15 and stopped 05/20/16 due to disease progression.  -I previously reviewed her restaging CT scan from  01/08/2016, which showed overall stable disease, no new lesions. We'll continue her chemotherapy. -I previously reviewed the CT from 05/19/2016 with the patient and her  daughter in detail. She has had a moderate disease progression in lungs, liver and peritoneum. I recommended her to change treatment. -She started second line single agent irinotecan on 06/03/16 at a reduced dose 146m/m2, tolerated well. -The goal of therapy is palliative, mainly to improve her quality of life. - PET Scan image from 08/09/16 showed good partial response to treatment. We will continue irinotecan and Avastin. -We previously discussed pt being able to take chemo break and she will notify [place] needed  -I will hold Avastin if her urine protein 300 or above  -We discussed her 11/25/16 PET scan. She has small increase in size of some of her small lung nodules, and mild increased FDG of her liver mets, but overall stable disease. I think the benefit of current treatment is getting smaller. We may need to change treatment in 2 months after a repeat scan. Will continue Irinotecan and avastin for now. She agrees.  -We will consider changing her treatment to 5-fu weekly in the future if she has disease progresses -I have low threshold to stop her chemotherapy due to her comorbidities, especially dementia, if she develops more symptoms or intolerance due to chemotherapy treatment. -The daughter and I previously discussed trying to get advanced home care to help pt and what type of help she is seeking. I also mentioned the options of physical therapy to help with her fatigue.  -I previously discussed she is tolerating treatment well and can continue for as long as she can tolerate. Her daughter and family agree. -PET scan on 03/13/17 revealed that hepatic metastases are stable in size with stable to minimally decreased hypermetabolism, the inferior left rectus abdominus metastasis shows mildly decreased hypermetabolism, the omental nodule no longer shows abnormal hypermetabolism. Scattered pulmonary nodules are stable in size and too small for PET resolution. 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  Previously discussed with pt and daughter. They are very pleased. -Her her urine proteins were prviously elevated at above 300 and Avastin was held on 03/17/17. Avastin was also held on 06/09/17 due to elevated proteinuria.     -labs reviewed, adeqaute proceed with Irinotecan and Avastin held today due to proteinuria. Will check at next visit lab.  -PET Scan before next visit -F/u in 4 weeks   2. CKD stage III, right hydroureter  -Her Cr was around 1 in 08/2013, Cr increased to 2.4 on 01/20/2015 when she was diagnosed with colon cancer due to dehydration, improved some and moved around.3-1.5 .  -A previous CT scan showed right hydroureter, which probably contributes to her renal insufficiency. -she had ureter stent placement again in 12/2015, 03/2016, 04/2016, and 04/2017 -Cr is overall stable   3. HTN, dementia  -She will continue her medication and follow-up with her primary care physician. -Her BP has been better controlled lately, we previously discussed that Avastin can cause hypertension, we'll continue monitoring closely. -Her dementia has got worse gradually, which has increased her care burden on her daughter. I previously discussed burn-out prevention with her daughter, she appreciated and has planned to have a care-giver for her mother  -Dementia better on Seroquel    4. Nausea and diarrhea -Secondary to chemotherapy, overall very mild and manageable  -She is to take Imodium PRN and her nausea medications. -Nausea has improved and diarrhea has resolved.   5. Goal of care  discussion  -We previously discussed the incurable nature of her cancer, and the overall poor prognosis, especially if she has progress on chemo -The patient understands the goal of care is palliative. -Due to her advanced age and comorbidities, especially dementia, I have low threshold to switch her to palliative care alone if she does not tolerate chemo well or dementia gets worse, her daughter is on -board  -I  recommend DNR/DNI, pt and her daughter agrees.  -We reviewed what help she could get in home aid service or hospice. They plan to continue with chemotherapy as long as she can tolerate it. I will contact home care service and see if her insurance will cover it.  -Based on consult with socal worker, she will wait on searching for further help  6. Insomnia  -I first suggested melatonin over-the counter and to check with her neurologist.  -If that does not help she can use prescription medication.  -Suggested she increase benadryl to 1.5-2 tabs as needed. She will try white noise machine for non-pharm intervention. -Much better sleeping on Seroquel    7. Social support -Due to her dementia and chemotherapy, she requires 24/7 care at home.  Her daughter is her only caregiver. -I will refer her to advanced home care for nursing, physical therapy, occupational therapy and home aids for bathing etc, referral was made today  -Social worker referral to discuss additional social services for pt  -She has a caregiver now that is coming by 2 times a week, daughter is planning to increase to 4 days a week    Plan -Labs reviewed, proceed with irinotecan. Avastin held today, due to high urine protein today, will recheck at next visit, OK to resume Avastin if urine protein<=100, continue treatment every 2 weeks  -continue bowel regimen  -PET Scan before next visit  -Lab and f/u in 4 weeks    All questions were answered. The patient knows to call the clinic with any problems, questions or concerns.  I spent 20 minutes counseling the patient face to face. The total time spent in the appointment was 25 minutes and more than 50% was on counseling.  This document serves as a record of services personally performed by [name], MD. It was created on her behalf by Theresia Bough, a trained medical scribe. The creation of this record is based on the scribe's personal observations and the provider's statements  to them.   I have reviewed the above documentation for accuracy and completeness, and I agree with the above.   [name]  06/09/2017 10:35 AM

## 2017-06-09 ENCOUNTER — Inpatient Hospital Stay (HOSPITAL_BASED_OUTPATIENT_CLINIC_OR_DEPARTMENT_OTHER): Payer: Medicare Other | Admitting: Hematology

## 2017-06-09 ENCOUNTER — Encounter: Payer: Self-pay | Admitting: Hematology

## 2017-06-09 ENCOUNTER — Inpatient Hospital Stay: Payer: Medicare Other | Attending: Hematology

## 2017-06-09 ENCOUNTER — Telehealth: Payer: Self-pay | Admitting: Hematology

## 2017-06-09 ENCOUNTER — Inpatient Hospital Stay: Payer: Medicare Other

## 2017-06-09 VITALS — BP 152/81 | HR 92 | Temp 97.7°F | Resp 16 | Ht 65.0 in | Wt 117.6 lb

## 2017-06-09 DIAGNOSIS — J439 Emphysema, unspecified: Secondary | ICD-10-CM

## 2017-06-09 DIAGNOSIS — C7800 Secondary malignant neoplasm of unspecified lung: Secondary | ICD-10-CM | POA: Diagnosis not present

## 2017-06-09 DIAGNOSIS — E785 Hyperlipidemia, unspecified: Secondary | ICD-10-CM | POA: Diagnosis not present

## 2017-06-09 DIAGNOSIS — F0390 Unspecified dementia without behavioral disturbance: Secondary | ICD-10-CM | POA: Insufficient documentation

## 2017-06-09 DIAGNOSIS — C182 Malignant neoplasm of ascending colon: Secondary | ICD-10-CM | POA: Diagnosis present

## 2017-06-09 DIAGNOSIS — I129 Hypertensive chronic kidney disease with stage 1 through stage 4 chronic kidney disease, or unspecified chronic kidney disease: Secondary | ICD-10-CM | POA: Insufficient documentation

## 2017-06-09 DIAGNOSIS — Z79899 Other long term (current) drug therapy: Secondary | ICD-10-CM | POA: Insufficient documentation

## 2017-06-09 DIAGNOSIS — M1990 Unspecified osteoarthritis, unspecified site: Secondary | ICD-10-CM

## 2017-06-09 DIAGNOSIS — K5900 Constipation, unspecified: Secondary | ICD-10-CM | POA: Diagnosis not present

## 2017-06-09 DIAGNOSIS — K219 Gastro-esophageal reflux disease without esophagitis: Secondary | ICD-10-CM | POA: Insufficient documentation

## 2017-06-09 DIAGNOSIS — N189 Chronic kidney disease, unspecified: Secondary | ICD-10-CM | POA: Diagnosis not present

## 2017-06-09 DIAGNOSIS — C786 Secondary malignant neoplasm of retroperitoneum and peritoneum: Secondary | ICD-10-CM

## 2017-06-09 DIAGNOSIS — Z7982 Long term (current) use of aspirin: Secondary | ICD-10-CM | POA: Insufficient documentation

## 2017-06-09 DIAGNOSIS — I10 Essential (primary) hypertension: Secondary | ICD-10-CM

## 2017-06-09 DIAGNOSIS — I700 Atherosclerosis of aorta: Secondary | ICD-10-CM | POA: Insufficient documentation

## 2017-06-09 DIAGNOSIS — D638 Anemia in other chronic diseases classified elsewhere: Secondary | ICD-10-CM | POA: Insufficient documentation

## 2017-06-09 DIAGNOSIS — Z5112 Encounter for antineoplastic immunotherapy: Secondary | ICD-10-CM | POA: Diagnosis not present

## 2017-06-09 DIAGNOSIS — C787 Secondary malignant neoplasm of liver and intrahepatic bile duct: Secondary | ICD-10-CM | POA: Insufficient documentation

## 2017-06-09 DIAGNOSIS — Z5111 Encounter for antineoplastic chemotherapy: Secondary | ICD-10-CM | POA: Diagnosis not present

## 2017-06-09 LAB — CBC WITH DIFFERENTIAL/PLATELET
BASOS ABS: 0.1 10*3/uL (ref 0.0–0.1)
BASOS PCT: 1 %
EOS ABS: 0.3 10*3/uL (ref 0.0–0.5)
EOS PCT: 4 %
HCT: 34 % — ABNORMAL LOW (ref 34.8–46.6)
Hemoglobin: 11.1 g/dL — ABNORMAL LOW (ref 11.6–15.9)
Lymphocytes Relative: 17 %
Lymphs Abs: 1.2 10*3/uL (ref 0.9–3.3)
MCH: 29.5 pg (ref 25.1–34.0)
MCHC: 32.8 g/dL (ref 31.5–36.0)
MCV: 90 fL (ref 79.5–101.0)
Monocytes Absolute: 0.5 10*3/uL (ref 0.1–0.9)
Monocytes Relative: 8 %
Neutro Abs: 4.9 10*3/uL (ref 1.5–6.5)
Neutrophils Relative %: 70 %
PLATELETS: 324 10*3/uL (ref 145–400)
RBC: 3.77 MIL/uL (ref 3.70–5.45)
RDW: 19.1 % — AB (ref 11.2–14.5)
WBC: 6.9 10*3/uL (ref 3.9–10.3)

## 2017-06-09 LAB — COMPREHENSIVE METABOLIC PANEL
ALK PHOS: 73 U/L (ref 40–150)
ALT: 9 U/L (ref 0–55)
AST: 15 U/L (ref 5–34)
Albumin: 3.3 g/dL — ABNORMAL LOW (ref 3.5–5.0)
Anion gap: 8 (ref 3–11)
BILIRUBIN TOTAL: 0.3 mg/dL (ref 0.2–1.2)
BUN: 10 mg/dL (ref 7–26)
CALCIUM: 9.1 mg/dL (ref 8.4–10.4)
CO2: 23 mmol/L (ref 22–29)
CREATININE: 1.25 mg/dL — AB (ref 0.60–1.10)
Chloride: 109 mmol/L (ref 98–109)
GFR calc Af Amer: 48 mL/min — ABNORMAL LOW (ref >60)
GFR, EST NON AFRICAN AMERICAN: 41 mL/min — AB (ref >60)
Glucose, Bld: 110 mg/dL (ref 70–140)
POTASSIUM: 3.8 mmol/L (ref 3.5–5.1)
Sodium: 140 mmol/L (ref 136–145)
TOTAL PROTEIN: 6.9 g/dL (ref 6.4–8.3)

## 2017-06-09 LAB — TOTAL PROTEIN, URINE DIPSTICK: PROTEIN: 300 mg/dL — AB

## 2017-06-09 LAB — CEA (IN HOUSE-CHCC): CEA (CHCC-In House): 14.97 ng/mL — ABNORMAL HIGH (ref 0.00–5.00)

## 2017-06-09 MED ORDER — IRINOTECAN HCL CHEMO INJECTION 100 MG/5ML
150.0000 mg/m2 | Freq: Once | INTRAVENOUS | Status: AC
  Administered 2017-06-09: 240 mg via INTRAVENOUS
  Filled 2017-06-09: qty 12

## 2017-06-09 MED ORDER — ATROPINE SULFATE 1 MG/ML IJ SOLN
INTRAMUSCULAR | Status: AC
  Filled 2017-06-09: qty 1

## 2017-06-09 MED ORDER — SODIUM CHLORIDE 0.9% FLUSH
10.0000 mL | Freq: Once | INTRAVENOUS | Status: AC
  Administered 2017-06-09: 10 mL
  Filled 2017-06-09: qty 10

## 2017-06-09 MED ORDER — SODIUM CHLORIDE 0.9% FLUSH
10.0000 mL | INTRAVENOUS | Status: DC | PRN
  Administered 2017-06-09: 10 mL
  Filled 2017-06-09: qty 10

## 2017-06-09 MED ORDER — PALONOSETRON HCL INJECTION 0.25 MG/5ML
0.2500 mg | Freq: Once | INTRAVENOUS | Status: AC
  Administered 2017-06-09: 0.25 mg via INTRAVENOUS

## 2017-06-09 MED ORDER — ATROPINE SULFATE 1 MG/ML IJ SOLN
0.5000 mg | Freq: Once | INTRAMUSCULAR | Status: AC | PRN
  Administered 2017-06-09: 0.5 mg via INTRAVENOUS

## 2017-06-09 MED ORDER — SODIUM CHLORIDE 0.9 % IV SOLN
Freq: Once | INTRAVENOUS | Status: AC
  Administered 2017-06-09: 11:00:00 via INTRAVENOUS

## 2017-06-09 MED ORDER — DEXAMETHASONE SODIUM PHOSPHATE 10 MG/ML IJ SOLN
INTRAMUSCULAR | Status: AC
  Filled 2017-06-09: qty 1

## 2017-06-09 MED ORDER — PALONOSETRON HCL INJECTION 0.25 MG/5ML
INTRAVENOUS | Status: AC
  Filled 2017-06-09: qty 5

## 2017-06-09 MED ORDER — DEXAMETHASONE SODIUM PHOSPHATE 10 MG/ML IJ SOLN
10.0000 mg | Freq: Once | INTRAMUSCULAR | Status: AC
  Administered 2017-06-09: 10 mg via INTRAVENOUS

## 2017-06-09 MED ORDER — HEPARIN SOD (PORK) LOCK FLUSH 100 UNIT/ML IV SOLN
500.0000 [IU] | Freq: Once | INTRAVENOUS | Status: AC | PRN
  Administered 2017-06-09: 500 [IU]
  Filled 2017-06-09: qty 5

## 2017-06-09 NOTE — Progress Notes (Signed)
Per Dr Burr Medico no Avastin today r/t urine protein of 300, only irinotecan.

## 2017-06-09 NOTE — Telephone Encounter (Signed)
Scheduled appt per 4/5 los - Gave patient AVS and calender per los.

## 2017-06-09 NOTE — Patient Instructions (Signed)
[facility] Discharge Instructions for Patients Receiving Chemotherapy  Today you received the following chemotherapy agents: irinotecan (Camptosar) To help prevent nausea and vomiting after your treatment, we encourage you to take your nausea medication as prescribed. Received Aloxi during treatment-->take Compazine (not Zofran) for the next 3 days.  If you develop nausea and vomiting that is not controlled by your nausea medication, call the clinic.   BELOW ARE SYMPTOMS THAT SHOULD BE REPORTED IMMEDIATELY:  *FEVER GREATER THAN 100.5 F  *CHILLS WITH OR WITHOUT FEVER  NAUSEA AND VOMITING THAT IS NOT CONTROLLED WITH YOUR NAUSEA MEDICATION  *UNUSUAL SHORTNESS OF BREATH  *UNUSUAL BRUISING OR BLEEDING  TENDERNESS IN MOUTH AND THROAT WITH OR WITHOUT PRESENCE OF ULCERS  *URINARY PROBLEMS  *BOWEL PROBLEMS  UNUSUAL RASH Items with * indicate a potential emergency and should be followed up as soon as possible.  Feel free to call the clinic should you have any questions or concerns. The clinic phone number is [phone].  Please show the [facility] at check-in to the Emergency Department and triage nurse.

## 2017-06-16 MED FILL — QUETIAPINE 25 MG TABLET: 25 | 30 days supply | Qty: 30 | Fill #5

## 2017-06-19 MED FILL — MIRTAZAPINE 15 MG TABLET: 15 | 30 days supply | Qty: 30 | Fill #1

## 2017-06-19 MED FILL — MYRBETRIQ ER 50 MG TABLET: 50 | 30 days supply | Qty: 30 | Fill #2

## 2017-06-23 ENCOUNTER — Inpatient Hospital Stay: Payer: Medicare Other

## 2017-06-23 ENCOUNTER — Other Ambulatory Visit: Payer: Medicare Other

## 2017-06-23 VITALS — BP 142/74 | HR 91 | Temp 98.1°F | Resp 18

## 2017-06-23 DIAGNOSIS — C182 Malignant neoplasm of ascending colon: Secondary | ICD-10-CM

## 2017-06-23 LAB — CBC WITH DIFFERENTIAL/PLATELET
Basophils Absolute: 0 10*3/uL (ref 0.0–0.1)
Basophils Relative: 1 %
EOS ABS: 0.1 10*3/uL (ref 0.0–0.5)
EOS PCT: 2 %
HCT: 32.1 % — ABNORMAL LOW (ref 34.8–46.6)
Hemoglobin: 10.5 g/dL — ABNORMAL LOW (ref 11.6–15.9)
Lymphocytes Relative: 10 %
Lymphs Abs: 0.6 10*3/uL — ABNORMAL LOW (ref 0.9–3.3)
MCH: 29.7 pg (ref 25.1–34.0)
MCHC: 32.8 g/dL (ref 31.5–36.0)
MCV: 90.5 fL (ref 79.5–101.0)
MONOS PCT: 7 %
Monocytes Absolute: 0.5 10*3/uL (ref 0.1–0.9)
Neutro Abs: 4.9 10*3/uL (ref 1.5–6.5)
Neutrophils Relative %: 80 %
Platelets: 274 10*3/uL (ref 145–400)
RBC: 3.55 MIL/uL — ABNORMAL LOW (ref 3.70–5.45)
RDW: 18.9 % — ABNORMAL HIGH (ref 11.2–14.5)
WBC: 6.2 10*3/uL (ref 3.9–10.3)

## 2017-06-23 LAB — TOTAL PROTEIN, URINE DIPSTICK: PROTEIN: 100 mg/dL

## 2017-06-23 LAB — COMPREHENSIVE METABOLIC PANEL
ALT: 10 U/L (ref 0–55)
AST: 15 U/L (ref 5–34)
Albumin: 3.2 g/dL — ABNORMAL LOW (ref 3.5–5.0)
Alkaline Phosphatase: 75 U/L (ref 40–150)
Anion gap: 7 (ref 3–11)
BUN: 17 mg/dL (ref 7–26)
CO2: 24 mmol/L (ref 22–29)
CREATININE: 1.32 mg/dL — AB (ref 0.60–1.10)
Calcium: 8.8 mg/dL (ref 8.4–10.4)
Chloride: 107 mmol/L (ref 98–109)
GFR calc Af Amer: 45 mL/min — ABNORMAL LOW (ref >60)
GFR calc non Af Amer: 38 mL/min — ABNORMAL LOW (ref >60)
GLUCOSE: 115 mg/dL (ref 70–140)
Potassium: 4.1 mmol/L (ref 3.5–5.1)
SODIUM: 138 mmol/L (ref 136–145)
Total Bilirubin: <0.2 mg/dL — ABNORMAL LOW (ref 0.2–1.2)
Total Protein: 6.7 g/dL (ref 6.4–8.3)

## 2017-06-23 MED ORDER — PALONOSETRON HCL INJECTION 0.25 MG/5ML
INTRAVENOUS | Status: AC
  Filled 2017-06-23: qty 5

## 2017-06-23 MED ORDER — SODIUM CHLORIDE 0.9 % IV SOLN
5.0000 mg/kg | Freq: Once | INTRAVENOUS | Status: AC
  Administered 2017-06-23: 275 mg via INTRAVENOUS
  Filled 2017-06-23: qty 11

## 2017-06-23 MED ORDER — ATROPINE SULFATE 1 MG/ML IJ SOLN
INTRAMUSCULAR | Status: AC
  Filled 2017-06-23: qty 1

## 2017-06-23 MED ORDER — PALONOSETRON HCL INJECTION 0.25 MG/5ML
0.2500 mg | Freq: Once | INTRAVENOUS | Status: AC
  Administered 2017-06-23: 0.25 mg via INTRAVENOUS

## 2017-06-23 MED ORDER — ATROPINE SULFATE 1 MG/ML IJ SOLN
0.5000 mg | Freq: Once | INTRAMUSCULAR | Status: AC | PRN
  Administered 2017-06-23: 0.5 mg via INTRAVENOUS

## 2017-06-23 MED ORDER — HEPARIN SOD (PORK) LOCK FLUSH 100 UNIT/ML IV SOLN
500.0000 [IU] | Freq: Once | INTRAVENOUS | Status: AC | PRN
  Administered 2017-06-23: 500 [IU]
  Filled 2017-06-23: qty 5

## 2017-06-23 MED ORDER — IRINOTECAN HCL CHEMO INJECTION 100 MG/5ML
150.0000 mg/m2 | Freq: Once | INTRAVENOUS | Status: AC
  Administered 2017-06-23: 240 mg via INTRAVENOUS
  Filled 2017-06-23: qty 12

## 2017-06-23 MED ORDER — DEXAMETHASONE SODIUM PHOSPHATE 10 MG/ML IJ SOLN
10.0000 mg | Freq: Once | INTRAMUSCULAR | Status: AC
  Administered 2017-06-23: 10 mg via INTRAVENOUS

## 2017-06-23 MED ORDER — DEXAMETHASONE SODIUM PHOSPHATE 10 MG/ML IJ SOLN
INTRAMUSCULAR | Status: AC
  Filled 2017-06-23: qty 1

## 2017-06-23 MED ORDER — SODIUM CHLORIDE 0.9 % IV SOLN
Freq: Once | INTRAVENOUS | Status: AC
  Administered 2017-06-23: 12:00:00 via INTRAVENOUS

## 2017-06-23 MED ORDER — SODIUM CHLORIDE 0.9% FLUSH
10.0000 mL | INTRAVENOUS | Status: DC | PRN
  Administered 2017-06-23: 10 mL
  Filled 2017-06-23: qty 10

## 2017-06-23 NOTE — Patient Instructions (Signed)
[facility] Discharge Instructions for Patients Receiving Chemotherapy  Today you received the following chemotherapy agents Irinotecan, Avastin  To help prevent nausea and vomiting after your treatment, we encourage you to take your nausea medication as directed   If you develop nausea and vomiting that is not controlled by your nausea medication, call the clinic.   BELOW ARE SYMPTOMS THAT SHOULD BE REPORTED IMMEDIATELY:  *FEVER GREATER THAN 100.5 F  *CHILLS WITH OR WITHOUT FEVER  NAUSEA AND VOMITING THAT IS NOT CONTROLLED WITH YOUR NAUSEA MEDICATION  *UNUSUAL SHORTNESS OF BREATH  *UNUSUAL BRUISING OR BLEEDING  TENDERNESS IN MOUTH AND THROAT WITH OR WITHOUT PRESENCE OF ULCERS  *URINARY PROBLEMS  *BOWEL PROBLEMS  UNUSUAL RASH Items with * indicate a potential emergency and should be followed up as soon as possible.  Feel free to call the clinic should you have any questions or concerns. The clinic phone number is [phone].  Please show the CHEMO ALERT CARD at check-in to the Emergency Department and triage nurse.   

## 2017-07-03 ENCOUNTER — Ambulatory Visit (HOSPITAL_COMMUNITY)
Admission: RE | Admit: 2017-07-03 | Discharge: 2017-07-03 | Disposition: A | Discharge status: Home / Self Care | Payer: Medicare Other | Source: Ambulatory Visit | Attending: Hematology | Admitting: Hematology

## 2017-07-03 DIAGNOSIS — I700 Atherosclerosis of aorta: Secondary | ICD-10-CM | POA: Diagnosis not present

## 2017-07-03 DIAGNOSIS — C182 Malignant neoplasm of ascending colon: Secondary | ICD-10-CM

## 2017-07-03 DIAGNOSIS — D259 Leiomyoma of uterus, unspecified: Secondary | ICD-10-CM | POA: Insufficient documentation

## 2017-07-03 DIAGNOSIS — R918 Other nonspecific abnormal finding of lung field: Secondary | ICD-10-CM | POA: Diagnosis not present

## 2017-07-03 DIAGNOSIS — J439 Emphysema, unspecified: Secondary | ICD-10-CM | POA: Diagnosis not present

## 2017-07-03 DIAGNOSIS — M47819 Spondylosis without myelopathy or radiculopathy, site unspecified: Secondary | ICD-10-CM | POA: Diagnosis not present

## 2017-07-03 DIAGNOSIS — I2510 Atherosclerotic heart disease of native coronary artery without angina pectoris: Secondary | ICD-10-CM | POA: Insufficient documentation

## 2017-07-03 DIAGNOSIS — C787 Secondary malignant neoplasm of liver and intrahepatic bile duct: Secondary | ICD-10-CM | POA: Diagnosis not present

## 2017-07-03 LAB — GLUCOSE, CAPILLARY: GLUCOSE-CAPILLARY: 93 mg/dL (ref 65–99)

## 2017-07-03 MED ORDER — FLUDEOXYGLUCOSE F - 18 (FDG) INJECTION
5.8300 | Freq: Once | INTRAVENOUS | Status: AC | PRN
  Administered 2017-07-03: 5.83 via INTRAVENOUS

## 2017-07-04 MED FILL — AMLODIPINE BESYLATE 5 MG TA: 5 | 30 days supply | Qty: 30 | Fill #1

## 2017-07-04 MED FILL — VILAMIT MB 118 MG CAPS: 118 | 15 days supply | Qty: 30 | Fill #2

## 2017-07-07 ENCOUNTER — Inpatient Hospital Stay (HOSPITAL_BASED_OUTPATIENT_CLINIC_OR_DEPARTMENT_OTHER): Payer: Medicare Other | Admitting: Hematology

## 2017-07-07 ENCOUNTER — Encounter: Payer: Self-pay | Admitting: Hematology

## 2017-07-07 ENCOUNTER — Inpatient Hospital Stay: Payer: Medicare Other | Attending: Hematology

## 2017-07-07 ENCOUNTER — Inpatient Hospital Stay: Payer: Medicare Other

## 2017-07-07 ENCOUNTER — Telehealth: Payer: Self-pay | Admitting: Hematology

## 2017-07-07 VITALS — BP 156/78 | HR 91 | Temp 97.9°F | Resp 18 | Ht 65.0 in | Wt 116.7 lb

## 2017-07-07 DIAGNOSIS — C787 Secondary malignant neoplasm of liver and intrahepatic bile duct: Secondary | ICD-10-CM

## 2017-07-07 DIAGNOSIS — Z87891 Personal history of nicotine dependence: Secondary | ICD-10-CM

## 2017-07-07 DIAGNOSIS — I2510 Atherosclerotic heart disease of native coronary artery without angina pectoris: Secondary | ICD-10-CM | POA: Insufficient documentation

## 2017-07-07 DIAGNOSIS — D259 Leiomyoma of uterus, unspecified: Secondary | ICD-10-CM | POA: Insufficient documentation

## 2017-07-07 DIAGNOSIS — C786 Secondary malignant neoplasm of retroperitoneum and peritoneum: Secondary | ICD-10-CM | POA: Diagnosis not present

## 2017-07-07 DIAGNOSIS — F0390 Unspecified dementia without behavioral disturbance: Secondary | ICD-10-CM | POA: Diagnosis not present

## 2017-07-07 DIAGNOSIS — N183 Chronic kidney disease, stage 3 (moderate): Secondary | ICD-10-CM | POA: Insufficient documentation

## 2017-07-07 DIAGNOSIS — C182 Malignant neoplasm of ascending colon: Secondary | ICD-10-CM

## 2017-07-07 DIAGNOSIS — E785 Hyperlipidemia, unspecified: Secondary | ICD-10-CM | POA: Insufficient documentation

## 2017-07-07 DIAGNOSIS — Z7982 Long term (current) use of aspirin: Secondary | ICD-10-CM | POA: Diagnosis not present

## 2017-07-07 DIAGNOSIS — I129 Hypertensive chronic kidney disease with stage 1 through stage 4 chronic kidney disease, or unspecified chronic kidney disease: Secondary | ICD-10-CM | POA: Insufficient documentation

## 2017-07-07 DIAGNOSIS — Z79899 Other long term (current) drug therapy: Secondary | ICD-10-CM | POA: Diagnosis not present

## 2017-07-07 DIAGNOSIS — J432 Centrilobular emphysema: Secondary | ICD-10-CM

## 2017-07-07 DIAGNOSIS — Z5112 Encounter for antineoplastic immunotherapy: Secondary | ICD-10-CM | POA: Insufficient documentation

## 2017-07-07 DIAGNOSIS — K219 Gastro-esophageal reflux disease without esophagitis: Secondary | ICD-10-CM | POA: Insufficient documentation

## 2017-07-07 DIAGNOSIS — F329 Major depressive disorder, single episode, unspecified: Secondary | ICD-10-CM | POA: Diagnosis not present

## 2017-07-07 DIAGNOSIS — I10 Essential (primary) hypertension: Secondary | ICD-10-CM

## 2017-07-07 DIAGNOSIS — I700 Atherosclerosis of aorta: Secondary | ICD-10-CM

## 2017-07-07 DIAGNOSIS — Z8041 Family history of malignant neoplasm of ovary: Secondary | ICD-10-CM | POA: Diagnosis not present

## 2017-07-07 DIAGNOSIS — Z5111 Encounter for antineoplastic chemotherapy: Secondary | ICD-10-CM | POA: Insufficient documentation

## 2017-07-07 DIAGNOSIS — D638 Anemia in other chronic diseases classified elsewhere: Secondary | ICD-10-CM

## 2017-07-07 LAB — CBC WITH DIFFERENTIAL/PLATELET
Basophils Absolute: 0.1 10*3/uL (ref 0.0–0.1)
Basophils Relative: 1 %
Eosinophils Absolute: 0.1 10*3/uL (ref 0.0–0.5)
Eosinophils Relative: 3 %
HEMATOCRIT: 34.6 % — AB (ref 34.8–46.6)
Hemoglobin: 11.1 g/dL — ABNORMAL LOW (ref 11.6–15.9)
LYMPHS PCT: 20 %
Lymphs Abs: 1.1 10*3/uL (ref 0.9–3.3)
MCH: 29.2 pg (ref 25.1–34.0)
MCHC: 32.1 g/dL (ref 31.5–36.0)
MCV: 91.1 fL (ref 79.5–101.0)
MONOS PCT: 7 %
Monocytes Absolute: 0.4 10*3/uL (ref 0.1–0.9)
NEUTROS ABS: 3.9 10*3/uL (ref 1.5–6.5)
Neutrophils Relative %: 69 %
Platelets: 324 10*3/uL (ref 145–400)
RBC: 3.8 MIL/uL (ref 3.70–5.45)
RDW: 17.8 % — AB (ref 11.2–14.5)
WBC: 5.5 10*3/uL (ref 3.9–10.3)

## 2017-07-07 LAB — COMPREHENSIVE METABOLIC PANEL
ALBUMIN: 3.5 g/dL (ref 3.5–5.0)
ALK PHOS: 76 U/L (ref 40–150)
ALT: <6 U/L (ref 0–55)
ANION GAP: 7 (ref 3–11)
AST: 18 U/L (ref 5–34)
BUN: 15 mg/dL (ref 7–26)
CALCIUM: 9.2 mg/dL (ref 8.4–10.4)
CO2: 25 mmol/L (ref 22–29)
Chloride: 109 mmol/L (ref 98–109)
Creatinine, Ser: 1.45 mg/dL — ABNORMAL HIGH (ref 0.60–1.10)
GFR calc Af Amer: 40 mL/min — ABNORMAL LOW (ref >60)
GFR calc non Af Amer: 34 mL/min — ABNORMAL LOW (ref >60)
GLUCOSE: 134 mg/dL (ref 70–140)
POTASSIUM: 4.1 mmol/L (ref 3.5–5.1)
SODIUM: 141 mmol/L (ref 136–145)
Total Bilirubin: 0.4 mg/dL (ref 0.2–1.2)
Total Protein: 6.9 g/dL (ref 6.4–8.3)

## 2017-07-07 LAB — CEA (IN HOUSE-CHCC): CEA (CHCC-In House): 15.22 ng/mL — ABNORMAL HIGH (ref 0.00–5.00)

## 2017-07-07 LAB — TOTAL PROTEIN, URINE DIPSTICK: Protein, ur: 300 mg/dL — AB

## 2017-07-07 MED ORDER — DEXAMETHASONE SODIUM PHOSPHATE 10 MG/ML IJ SOLN
INTRAMUSCULAR | Status: AC
  Filled 2017-07-07: qty 1

## 2017-07-07 MED ORDER — DEXAMETHASONE SODIUM PHOSPHATE 10 MG/ML IJ SOLN
10.0000 mg | Freq: Once | INTRAMUSCULAR | Status: AC
  Administered 2017-07-07: 10 mg via INTRAVENOUS

## 2017-07-07 MED ORDER — SODIUM CHLORIDE 0.9 % IV SOLN
Freq: Once | INTRAVENOUS | Status: AC
  Administered 2017-07-07: 11:00:00 via INTRAVENOUS

## 2017-07-07 MED ORDER — SODIUM CHLORIDE 0.9% FLUSH
10.0000 mL | INTRAVENOUS | Status: DC | PRN
  Administered 2017-07-07: 10 mL
  Filled 2017-07-07: qty 10

## 2017-07-07 MED ORDER — ATROPINE SULFATE 1 MG/ML IJ SOLN
INTRAMUSCULAR | Status: AC
  Filled 2017-07-07: qty 1

## 2017-07-07 MED ORDER — PALONOSETRON HCL INJECTION 0.25 MG/5ML
INTRAVENOUS | Status: AC
  Filled 2017-07-07: qty 5

## 2017-07-07 MED ORDER — ATROPINE SULFATE 1 MG/ML IJ SOLN
0.5000 mg | Freq: Once | INTRAMUSCULAR | Status: AC | PRN
  Administered 2017-07-07: 0.5 mg via INTRAVENOUS

## 2017-07-07 MED ORDER — IRINOTECAN HCL CHEMO INJECTION 100 MG/5ML
150.0000 mg/m2 | Freq: Once | INTRAVENOUS | Status: AC
  Administered 2017-07-07: 240 mg via INTRAVENOUS
  Filled 2017-07-07: qty 12

## 2017-07-07 MED ORDER — PALONOSETRON HCL INJECTION 0.25 MG/5ML
0.2500 mg | Freq: Once | INTRAVENOUS | Status: AC
  Administered 2017-07-07: 0.25 mg via INTRAVENOUS

## 2017-07-07 MED ORDER — SODIUM CHLORIDE 0.9% FLUSH
10.0000 mL | Freq: Once | INTRAVENOUS | Status: AC
  Administered 2017-07-07: 10 mL
  Filled 2017-07-07: qty 10

## 2017-07-07 MED ORDER — HEPARIN SOD (PORK) LOCK FLUSH 100 UNIT/ML IV SOLN
500.0000 [IU] | Freq: Once | INTRAVENOUS | Status: AC | PRN
  Administered 2017-07-07: 500 [IU]
  Filled 2017-07-07: qty 5

## 2017-07-07 MED FILL — LIDOCAINE-PRILOCAINE CREAM: 2.5-2.5 | 30 days supply | Qty: 30 | Fill #1

## 2017-07-07 NOTE — Progress Notes (Signed)
[place]  [phone] Fax:(336) [phone]  Clinic Follow Up Note   Patient Care Team: [name], MD as PCP - General (Family Medicine) [name], MD as Consulting Physician (Medical Oncology) [name], MD as Consulting Physician (General Surgery) [name], MD as Consulting Physician (Orthopedic Surgery) [name] [name], MD as Consulting Physician (Urology) 07/07/2017     CHIEF COMPLAINTS:  Follow up metastatic colon cancer  Oncology History   Cancer of right colon [facility])   Staging form: Colon and Rectum, AJCC 7th Edition     Pathologic stage from 01/23/2015: Stage IIC (T4b, N0, cM0) - Signed by [name], MD on 02/05/2015       Cancer of right colon ([place])   01/21/2015 Imaging     CT abdomen and pelvis showed high-grade small bowel obstruction,  right I [place] ureteral nephrosis,  multiple liver cysts.  CT chest was negative for metastatic lesion.      01/23/2015 Pathology Results     invasive adenocarcinoma extending into the pericecal connective tissue,  grade 2, LVI (-),  no perineural invasion, 3 lymph nodes were negative.      01/23/2015 Surgery      terminal ileum and cecum seegmental resection with anastomosis by Dr. Hassell Done      01/23/2015 Initial Diagnosis    Cancer of right colon ([place])      03/13/2015 - 07/06/2015 Chemotherapy    Xeloda 1500 mg bid X 14 days on-7 off, s/p 5 cycles, stopped early due to wrosening renal function      10/28/2015 Progression    PET scan showed multiple hypermetabolic peritoneal nodules, right ovarian cystic mass, and hypermetabolic liver lesion, highly suspicious for metastatic colon cancer. Small pulmonary nodules are indeterminate.       11/11/2015 - 05/20/2016 Chemotherapy    Xeloda '1500mg'$  bid, 2 weeks on and one week off, Avastin was added on from cycle 3. Xeloda held from 05/20/16 due to progression on CT from 05/19/16 and total bilirubin increased to 2.6.      01/08/2016 Imaging   Ct chest, Abdomen Pelvis Wo Contrast 01/08/2016  IMPRESSION: 1. 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 2. Hepatic and renal cysts. 3.  Aortoiliac atherosclerotic vascular disease. 4. Lumbar spondylosis and degenerative disc disease with multilevel Impingement.      05/19/2016 Imaging    CT CAP w Contrast IMPRESSION: 1. Numerous small solid pulmonary nodules, one of which is new, and several of which have mildly increased in size since 10/28/2015, worrisome for mild progression of pulmonary metastases. 2. Solitary right liver lobe metastasis is mildly increased in size since 01/08/2016. 3. Peritoneal metastases have mildly increased in size since 01/08/2016, largest in the right pelvic sidewall. 4. Well-positioned right nephroureteral stent without overt right hydronephrosis. 5. Additional findings include aortic atherosclerosis, 1 vessel coronary atherosclerosis and mild to moderate emphysema.      06/03/2016 -  Chemotherapy    Second-Line Irinotecan and Avastin every 2 weeks starting 06/03/16      07/29/2016 Tumor Marker    CEA 6.51       08/09/2016 Imaging    PET  IMPRESSION: 1. Overall mild improvement compared to FDG PET scan of 10/28/2015. 2. Pulmonary nodules are increased in size from PET-CT scan and similar size to most recent CT scan. Largest lesion RIGHT lower lobe does have associated faint metabolic activity consistent with pulmonary metastasis. 3. Decrease in metabolic activity of RIGHT hepatic lobe lesion. Potential  second lesion LEFT hepatic lobe with slightly lower metabolic activity. 4. Decreased metabolic activity at the enteric colonic anastomosis in the RIGHT lower quadrant. 5. Interval decrease in metabolic activity of peritoneal nodular metastasis. 6. Nodular implant implant along ventral abdominal wall may be within the musculature, but also improved.      09/09/2016 Tumor Marker     CEA: 7.47      10/07/2016 Tumor Marker    CEA: 7.85      11/04/2016 Tumor Marker    CEA: 7.75      11/25/2016 PET scan    PET 11/25/16 IMPRESSION: 1. Interval worsening in the interval. Numerous pulmonary nodules are again seen. While multiple nodules are stable, there are several nodules which are a little larger in the interval. They remain too small to completely characterize with PET-CT. The 2 hepatic metastases demonstrate increased FDG uptake on today's study compared to the previous study. Abdominal wall and peritoneal metastases are mildly worsened.      12/02/2016 Tumor Marker    CEA: 8.13      01/06/2017 Tumor Marker    CEA: 10.36      02/03/2017 Tumor Marker    CEA: 12.60      03/03/2017 Tumor Marker    CEA: 9.96      03/13/2017 PET scan    IMPRESSION: 1. Hepatic metastases are stable in size with stable to minimally decreased hypermetabolism. 2. Inferior left rectus abdominus metastasis shows mildly decreased hypermetabolism. 3. Omental nodule no longer shows abnormal hypermetabolism. 4. Scattered pulmonary nodules are stable in size and too small for PET resolution. 5.  Aortic atherosclerosis (ICD10-170.0). 6.  Emphysema (ICD10-J43.9).      07/03/2017 Imaging    PET, 07/03/2017 IMPRESSION: 1. General mild worsening, with increased activity in the hepatic metastatic lesions, mesenteric and omental nodularity, and with new/worsening metastatic nodularity in the right lower quadrant below the cecum. 2. There are over 20 small pulmonary nodules which are below sensitive PET-CT size thresholds and stable from the prior exam, but not currently hypermetabolic. 3. Other imaging findings of potential clinical significance: Aortic Atherosclerosis (ICD10-I70.0) and Emphysema (ICD10-J43.9). Coronary atherosclerosis. Uterine fibroid. Small amount of free pelvic fluid in the cul-de-sac. Old pelvic deformities from prior fractures. Degenerative hip and glenohumeral  arthropathy. Spondylosis.       HISTORY OF PRESENTING ILLNESS (02/05/2015):  Beth Wilkins 77 y.o. female  with past medical history of moderate dementia, hypertension, is here because of recently diagnosed stage II colon cancer. She is accompanied to the clinic by her daughter. History is mainly obtained from the chart and her daughter.  She has had abdominal discomfort, nausea and vomiting, and anorexia for  the past to 3 months along with 15 pounds weight loss, . Her nausea was mild and intermittent, she does not seek medical attention initially. She presented with  worsening symptoms for 2-3 weeks, and episodes of projectile vomiting and dehydration to emergency room, was admitted to [facility] on 01/20/2015. CT scan reviewed small bowel obstruction, and possible appendiceal rupture. She was brought to OR on 01/23/2015, underwent right hemicolectomy with anastomosis by Dr. Hassell Done. Surgical path reviewed adenocarcinoma at the cecum. She was discharged home on 01/26/2015.  She has recovered well, eating much better than prior to surgery, move around without restriction. No pain, she has loose BM 3 times a day, and she has backed off her miralax.  she lives with her daughter. She states her appetite is moderate, sometime low, and she refuses to  eat if she doesn't have appetite. No nausea, vomiting, abdominal bloating since her surgery.  Her last colonoscopy was 6-8 years ago, and her stool OB was negative this year.   CURRENT THERAPY: Second-Line Irinotecan and Avastin every 2 weeks started 06/03/16, Avastin held on 03/17/2017 due to proteinuria  INTERIM HISTORY:   Beth Wilkins is here for a follow up and Irinotecan/Avastin. She is accompanied by her daughter.   Since her last visit to the office, she underwent PET scan on 07/03/2017 with results showing: General mild worsening, with increased activity in the hepatic metastatic lesions, mesenteric and omental nodularity, and with new/worsening  metastatic nodularity in the right lower quadrant below the cecum. There are over 20 small pulmonary nodules which are below sensitive PET-CT size thresholds and stable from the prior exam, but not currently hypermetabolic. Other imaging findings of potential clinical significance: Aortic Atherosclerosis (ICD10-I70.0) and Emphysema (ICD10-J43.9). Coronary atherosclerosis. Uterine fibroid. Small amount of free pelvic fluid in the cul-de-sac. Old pelvic deformities from prior fractures. Degenerative hip and glenohumeral arthropathy. Spondylosis.  Per her daughter, she is doing well. However she did have some depression today that is related to her demenia. They currently have at home care two days a week, and they are hoping for a third day. The patient experiences some constipation and will have a bowel movement every other day. She has a stent in place. Her daughter notices that she endorses abdominal pain when urinating and is wondering if it is related.    On review of systems, she reports depression related to dementia and fatigue. she denies abdominal pain, nausea, vomiting and any other symptoms.    MEDICAL HISTORY:  Past Medical History:  Diagnosis Date  . Anxiety   . Aortic atherosclerosis ([place]) 03/15/2017   noted on PET   . Arthritis   . Bilateral renal cysts   . Centrilobular emphysema ([place]) per 05-10-16 chest xray  . CKD (chronic kidney disease), stage III ([place])   . Colon cancer [facility]) oncologist-  dr [name]   dx 01-23-2015  right colon Invasive adenocarcinoma into pericecal connective tissue and involving small intestine , Stage IIc, Grade 2, LVI(-),  (pT4b N0 M0) /  s/p resection termial ileum and cecum and chemotherapy 01-06-217 to 07-06-2015/   08/ 2017  per PET  recurrent w/ liver mets and peritoneal carcinomatosis  . Complication of anesthesia    can be combative due to dementia  . Full dentures   . Headache    occ stress related  . History of chemotherapy    every other  friday currently receiving  . History of gastroesophageal reflux (GERD)    changes diet  . History of pelvic fracture 2015  . Hyperlipidemia   . Hypertension   . Liver metastasis ([place])   . 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 N&V (nausea and vomiting)   . Pulmonary nodules   . Right ovarian cyst   . SBO (small bowel obstruction) ([place]) 01/2015  . 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 UTI (urinary tract infection) 09/26/2016   5 day antibiotic treatment    SURGICAL HISTORY: Past Surgical History:  Procedure Laterality Date  . CATARACT EXTRACTION W/ INTRAOCULAR LENS  IMPLANT, BILATERAL  2005 approx  . CYSTOSCOPY W/ URETERAL STENT PLACEMENT Right 12/03/2015   Procedure:  CYSTOSCOPY WITH RETROGRADE PYELOGRAM/URETERAL STENT PLACEMENT;  Surgeon: Cleon Gustin, MD;  Location: [facility];  Service: Urology;  Laterality: Right;  . CYSTOSCOPY W/ URETERAL STENT PLACEMENT Right 02/15/2016   Procedure: CYSTOSCOPY WITH RETROGRADE PYELOGRAM/URETERAL STENT REPLACEMENT;  Surgeon: Cleon Gustin, MD;  Location: [facility];  Service: Urology;  Laterality: Right;  . CYSTOSCOPY W/ URETERAL STENT PLACEMENT Right 03/28/2016   Procedure: CYSTOSCOPY WITH RETROGRADE PYELOGRAM/URETERAL STENT EXCHANGE;  Surgeon: Cleon Gustin, MD;  Location: [facility];  Service: Urology;  Laterality: Right;  . CYSTOSCOPY W/ URETERAL STENT PLACEMENT Right 07/18/2016   Procedure: CYSTOSCOPY WITH RETROGRADE PYELOGRAM/ STENT EXCHANGE;  Surgeon: Cleon Gustin, MD;  Location: [facility];  Service: Urology;  Laterality: Right;  . CYSTOSCOPY W/ URETERAL STENT PLACEMENT Right 10/10/2016   Procedure: CYSTOSCOPY WITH RETROGRADE PYELOGRAM/URETERAL STENT REPLACEMENT;   Surgeon: Cleon Gustin, MD;  Location: [facility];  Service: Urology;  Laterality: Right;  . CYSTOSCOPY W/ URETERAL STENT PLACEMENT Right 04/10/2017   Procedure: CYSTOSCOPY WITH RETROGRADE PYELOGRAM/URETERAL STENT EXCHANGE;  Surgeon: Cleon Gustin, MD;  Location[facility];  Service: Urology;  Laterality: Right;  . CYSTOSCOPY WITH RETROGRADE PYELOGRAM, URETEROSCOPY AND STENT PLACEMENT Right 09/21/2015   Procedure: CYSTOSCOPY WITH BIL RETROGRADE PYELOGRAM, URETEROSCOPY, RIGHT STENT PLACEMENT ;  Surgeon: Cleon Gustin, MD;  Location: [facility];  Service: Urology;  Laterality: Right;  . HEMICOLECTOMY Right 01/23/2015   Dr. Hassell Done  . IR FLUORO GUIDE PORT INSERTION RIGHT  03/24/2017  . IR US GUIDE VASC ACCESS RIGHT  03/24/2017  . LAPAROSCOPY N/A 01/23/2015   Procedure: DIAGNOSTIC LAPAROSCOPY, EXPLORATORY LAPAROTOMY  RIGHT HEMI-COLECTOMY FOR OBSTRUCTION OF RIGHT TERMINAL ILEUM;  Surgeon: [name], MD;  Location: [facility];  Service: General;  Laterality: N/A;    SOCIAL HISTORY: Social History   Socioeconomic History  . Marital status: Single    Spouse name: Not on file  . Number of children: Not on file  . Years of education: Not on file  . Highest education level: Not on file  Occupational History  . Not on file  Social Needs  . Financial resource strain: Not on file  . Food insecurity:    Worry: Not on file    Inability: Not on file  . Transportation needs:    Medical: Not on file    Non-medical: Not on file  Tobacco Use  . Smoking status: Former Smoker    Packs/day: 0.25    Years: 50.00    Pack years: 12.50    Types: Cigarettes    Last attempt to quit: 01/25/2015    Years since quitting: 2.4  . Smokeless tobacco: Never Used  Substance and Sexual Activity  . Alcohol use: Yes    Alcohol/week: 1.8 oz    Types: 2 Glasses of wine, 1 Cans of beer per week    Comment: occasional   . Drug use: No  . Sexual activity:  Not on file  Lifestyle  . Physical activity:    Days per week: Not on file    Minutes per session: Not on file  . Stress: Not on file  Relationships  . Social connections:    Talks on phone: Not on file    Gets together: Not on file    Attends religious service: Not on file    Active member of club or organization: Not on file    Attends meetings of clubs or organizations: Not  on file    Relationship status: Not on file  . Intimate partner violence:    Fear of current or ex partner: Not on file    Emotionally abused: Not on file    Physically abused: Not on file    Forced sexual activity: Not on file  Other Topics Concern  . Not on file  Social History Narrative   Legally separated   Lives w/daughter, Beth Wilkins   Has total of #2 daughters and #1 son   Dementia   Fall Risk-ambulates w/cane    FAMILY HISTORY: Family History  Problem Relation Age of Onset  . Diabetes Mellitus II Father   . Cancer Mother 6       ovarian cancer   . Cervical cancer Other     ALLERGIES:  is allergic to namenda [memantine hcl] and oxycodone.  MEDICATIONS:  Current Outpatient Medications  Medication Sig Dispense Refill  . amLODipine (NORVASC) 5 MG tablet Take 5 mg by mouth every morning.     Marland Kitchen aspirin 81 MG tablet Take 81 mg by mouth daily.    . Cholecalciferol (VITAMIN D3) 2000 UNITS TABS Take by mouth daily.    . Coenzyme Q10 (CO Q 10) 100 MG CAPS Take by mouth daily.    Marland Kitchen lidocaine-prilocaine (EMLA) cream Apply 1 application topically as needed. 30 g 5  . Melatonin 5 MG TABS Take 5-15 mg by mouth at bedtime as needed.    . Meth-Hyo-M Bl-Na Phos-Ph Sal (URIBEL) 118 MG CAPS Take 1 capsule (118 mg total) by mouth 2 (two) times daily as needed. 30 capsule 3  . mirabegron ER (MYRBETRIQ) 50 MG TB24 tablet Take 1 tablet (50 mg total) by mouth daily. 30 tablet 11  . mirtazapine (REMERON) 15 MG tablet TAKE 1 TABLET BY MOUTH AT BEDTIME 30 tablet 3  . Multiple Vitamins-Minerals (WOMENS  MULTIVITAMIN PO) Take 1 tablet by mouth daily. With iron    . polyethylene glycol (MIRALAX / GLYCOLAX) packet Take 17 g by mouth daily. (Patient taking differently: Take 17 g by mouth daily as needed. ) 14 each 0  . prochlorperazine (COMPAZINE) 5 MG tablet Take 1 tablet (5 mg total) by mouth every 6 (six) hours as needed for nausea or vomiting. 30 tablet 0  . QUEtiapine (SEROQUEL) 25 MG tablet Take 1 tablet (25 mg total) by mouth at bedtime. Take 1/2 tablet at night for 1 week, then increase to 1 tablet at night 30 tablet 6  . traMADol (ULTRAM) 50 MG tablet Take 1 tablet (50 mg total) by mouth every 6 (six) hours as needed. 30 tablet 0  . UNABLE TO FIND Med Name: Closys Mouth Rinse two-three times daily for ulcer.    . vitamin B-12 (CYANOCOBALAMIN) 1000 MCG tablet Take 1,000 mcg by mouth daily.     No current facility-administered medications for this visit.    Facility-Administered Medications Ordered in Other Visits  Medication Dose Route Frequency Provider Last Rate Last Dose  . heparin lock flush 100 unit/mL  500 Units Intravenous Once [name], MD      . sodium chloride flush (NS) 0.9 % injection 10 mL  10 mL Intracatheter PRN [name], MD   10 mL at 07/07/17 1409    REVIEW OF SYSTEMS:  Constitutional: Denies fevers, chills or abnormal night sweats (+) Improved appetite (+) good appetite Eyes: Denies blurriness of vision, double vision or watery eyes  Ears, nose, mouth, throat, and face: Denies mucositis or sore throat  Respiratory: Denies  cough, dyspnea or wheezes Cardiovascular: Denies palpitation, chest discomfort or lower extremity swelling Gastrointestinal:  Denies heartburn (+) constipation  Urinary:  Denies Dysuria  Skin: Denies abnormal skin rashes Lymphatics: Denies new lymphadenopathy or easy bruising Neurological:Denies numbness, tingling or new weaknesses Behavioral/Psych: Mood is stable, no new changes (+) Dementia All other systems were reviewed with the patient and  are negative.  PHYSICAL EXAMINATION:  ECOG PERFORMANCE STATUS: 1 - Symptomatic but completely ambulatory  Vitals:   07/07/17 1005  BP: (!) 156/78  Pulse: 91  Resp: 18  Temp: 97.9 F (36.6 C)  SpO2: 100%   Filed Weights   07/07/17 1005  Weight: 116 lb 11.2 oz (52.9 kg)     GENERAL:alert, no distress and comfortable SKIN: skin color, texture, turgor are normal, no rashes or significant lesions EYES: normal, conjunctiva are pink and non-injected, sclera clear (+) difficult to evaluate due to her dementia OROPHARYNX:no exudate (+) Upper and lower dentures  (+) slight color change of tongue  NECK: supple, thyroid normal size, non-tender, without nodularity LYMPH:  no palpable lymphadenopathy in the cervical, axillary or inguinal LUNGS: clear to auscultation and percussion with normal breathing effort HEART: regular rate & rhythm and no murmurs and no lower extremity edema ABDOMEN:abdomen soft, non-tender and normal bowel sounds  Musculoskeletal:no cyanosis of digits and no clubbing  PSYCH: alert and with fluent speech, (+) dementia  NEURO: no focal motor/sensory deficits  LABORATORY DATA:  I have reviewed the data as listed CBC Latest Ref Rng & Units 07/07/2017 06/23/2017 06/09/2017  WBC 3.9 - 10.3 K/uL 5.5 6.2 6.9  Hemoglobin 11.6 - 15.9 g/dL 11.1(L) 10.5(L) 11.1(L)  Hematocrit 34.8 - 46.6 % 34.6(L) 32.1(L) 34.0(L)  Platelets 145 - 400 K/uL 324 274 324    CMP Latest Ref Rng & Units 07/07/2017 06/23/2017 06/09/2017  Glucose 70 - 140 mg/dL 134 115 110  BUN 7 - 26 mg/dL '15 17 10  '$ Creatinine 0.60 - 1.10 mg/dL 1.45(H) 1.32(H) 1.25(H)  Sodium 136 - 145 mmol/L 141 138 140  Potassium 3.5 - 5.1 mmol/L 4.1 4.1 3.8  Chloride 98 - 109 mmol/L 109 107 109  CO2 22 - 29 mmol/L '25 24 23  '$ Calcium 8.4 - 10.4 mg/dL 9.2 8.8 9.1  Total Protein 6.4 - 8.3 g/dL 6.9 6.7 6.9  Total Bilirubin 0.2 - 1.2 mg/dL 0.4 <0.2(L) 0.3  Alkaline Phos 40 - 150 U/L 76 75 73  AST 5 - 34 U/L '18 15 15  '$ ALT 0 - 55 U/L '6  10 9    '$ CEA  01/23/2015: 5.7 06/05/2015: 6.1 08/04/2015: 10.3 10/12/2015: 13.29 (in-house)  11/04/2015: 12.83 01/13/2016: 8.57 02/24/2016: 8.41 04/27/2016: 9.22 05/20/2016: 8.32 06/17/2016: 6.45 07/15/2016: 5.07 07/29/2016: 6.51 09/09/16: 7.47 10/07/16: 7.85 11/04/16: 7.75 12/02/16: 8.13 01/06/17: 10.36 02/03/17: 12.60 03/03/18: 9.96  03/31/17: 12.7 04/14/17: 11.93 05/12/17: 13.00 06/09/17: Pending    PATHOLOGY REPORT: Diagnosis 01/23/2015 Colon, segmental resection, with terminal ileum and cecum - INVASIVE ADENOCARCINOMA EXTENDING INTO PERICECAL CONNECTIVE TISSUE AND INVOLVING SMALL INTESTINE. - THREE BENIGN LYMPH NODES (0/3). - SEE ONCOLOGY TABLE BELOW.  Microscopic Comment COLON AND RECTUM (INCLUDING TRANS-ANAL RESECTION): Specimen: Terminal ileum and cecum. Procedure: Resection. Tumor site: Cecum adjacent to appendix. Specimen integrity: Intact. Macroscopic intactness of mesorectum: Not applicable. Macroscopic tumor perforation: No. Invasive tumor: Maximum size: 4.0 cm. Histologic type(s): Colorectal adenocarcinoma. Histologic grade and differentiation: G2: moderately differentiated/low grade. Type of polyp in which invasive carcinoma arose: No residual polyp. Microscopic extension of invasive tumor: Into pericecal connective tissue and terminal ileum.  Lymph-Vascular invasion: Not identified. Peri-neural invasion: Not identified. Tumor deposit(s) (discontinuous extramural extension): No. Resection margins: Proximal margin: Free of tumor. Distal margin: Free of tumor. Circumferential (radial) (posterior ascending, posterior descending; lateral and posterior mid-rectum; and entire lower 1/3 rectum): Free of tumor. Mesenteric margin (sigmoid and transverse): N/A. Distance closest margin (if all above margins negative): N/A. Treatment effect (neo-adjuvant therapy): No. Additional polyp(s): No. Non-neoplastic findings: N/A. Lymph nodes: number examined 3; number positive:  0. Pathologic Staging: pT4b, pN0, pMX. Ancillary studies: Mismatch repair protein by immunohistochemistry pending. Comments: There is a colorectal-type adenocarcinoma arising in the cecum in the area of the appendiceal orifice. The tumor extends into the pericecal connective tissue, and involves the attached segment of terminal ileum. Immunohistochemistry shows the tumor is positive with CDX2 and cytokeratin 20, and negative with cytokeratin 7, estrogen receptor and WT-1 supporting a diagnosis of primary colorectal adenocarcinoma. ADDITIONAL INFORMATION: Mismatch Repair (MMR) Protein Immunohistochemistry (IHC) IHC Expression Result: MLH1: Preserved nuclear expression (greater 50% tumor expression) MSH2: Preserved nuclear expression (greater 50% tumor expression) MSH6: Preserved nuclear expression (greater 50% tumor expression) PMS2: Preserved nuclear expression (greater 50% tumor expression) * Internal control demonstrates intact nuclear expression Interpretation: NORMAL There is preserved expression of the major and minor MMR proteins. There is a very low probability that microsatellite instability (MSI) is present. However, certain clinically significant MMR protein mutations may result in preservation of nuclear expression. It is recommended that the preservation of protein expression be correlated with molecular based MSI testing.    RADIOGRAPHIC STUDIES: I have personally reviewed the radiological images as listed and agreed with the findings in the report.  PET, 07/03/2017 IMPRESSION: 1. General mild worsening, with increased activity in the hepatic metastatic lesions, mesenteric and omental nodularity, and with new/worsening metastatic nodularity in the right lower quadrant below the cecum. 2. There are over 20 small pulmonary nodules which are below sensitive PET-CT size thresholds and stable from the prior exam, but not currently hypermetabolic. 3. Other imaging findings of  potential clinical significance: Aortic Atherosclerosis (ICD10-I70.0) and Emphysema (ICD10-J43.9). Coronary atherosclerosis. Uterine fibroid. Small amount of free pelvic fluid in the cul-de-sac. Old pelvic deformities from prior fractures. Degenerative hip and glenohumeral arthropathy. Spondylosis.  PET 03/13/17 IMPRESSION: 1. Hepatic metastases are stable in size with stable to minimally decreased hypermetabolism. 2. Inferior left rectus abdominus metastasis shows mildly decreased hypermetabolism. 3. Omental nodule no longer shows abnormal hypermetabolism. 4. Scattered pulmonary nodules are stable in size and too small for PET resolution. 5.  Aortic atherosclerosis (ICD10-170.0). 6.  Emphysema (ICD10-J43.9).  PET 11/25/16 IMPRESSION: 1. Interval worsening in the interval. Numerous pulmonary nodules are again seen. While multiple nodules are stable, there are several nodules which are a little larger in the interval. They remain too small to completely characterize with PET-CT. The 2 hepatic metastases demonstrate increased FDG uptake on today's study compared to the previous study. Abdominal wall and peritoneal metastases are mildly worsened.  PET 08/09/2016 IMPRESSION: 1. Overall mild improvement compared to FDG PET scan of 10/28/2015. 2. Pulmonary nodules are increased in size from PET-CT scan and similar size to most recent CT scan. Largest lesion RIGHT lower lobe does have associated faint metabolic activity consistent with pulmonary metastasis. 3. Decrease in metabolic activity of RIGHT hepatic lobe lesion. Potential second lesion LEFT hepatic lobe with slightly lower metabolic activity. 4. Decreased metabolic activity at the enteric colonic anastomosis in the RIGHT lower quadrant. 5. Interval decrease in metabolic activity of peritoneal nodular metastasis. 6. Nodular implant along ventral abdominal  wall may be within the musculature, but also improved.  CT CAP w Contrast  05/19/2016 IMPRESSION: 1. Numerous small solid pulmonary nodules, one of which is new, and several of which have mildly increased in size since 10/28/2015, worrisome for mild progression of pulmonary metastases. 2. Solitary right liver lobe metastasis is mildly increased in sizesince 01/08/2016. 3. Peritoneal metastases have mildly increased in size since 01/08/2016, largest in the right pelvic sidewall. 4. Well-positioned right nephroureteral stent without overt right hydronephrosis. 5. Additional findings include aortic atherosclerosis, 1 vessel coronary atherosclerosis and mild to moderate emphysema.  Ct chest, Abdomen Pelvis Wo Contrast 01/08/2016  IMPRESSION: 1. 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 2. Hepatic and renal cysts. 3.  Aortoiliac atherosclerotic vascular disease. 4. Lumbar spondylosis and degenerative disc disease with multilevel impingement.  ASSESSMENT & PLAN: 77 y.o. African-American female, with past medical history of moderate dementia, independent with ADLs, hypertension, presents with small bowel obstruction and weight loss.  1. 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 -I personally reviewed her recent PET scan images, unfortunately the scan showed hypermetabolic peritoneal and liver metastases. 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 -I previously reviewed her [facility] one genomic testing as always patient and her daughter, her  tumor has K-ras mutation, she is not a candidate for EGFR inhibitor. The tumor has MSI stable, she is not a candidate for immunotherapy, although she may be a candidate for clinical trial of immunotherapy plus MEK inhibitor at [facility] -She started Xeloda '1500mg'$  bid, 2 weeks on and one week off on 11/11/15 and stopped 05/20/16 due to disease progression.  -I previously reviewed her restaging CT scan from 01/08/2016, which showed overall stable disease, no new lesions. We'll continue her chemotherapy. -I previously reviewed the CT from 05/19/2016 with the patient and her daughter in detail. She has had a moderate disease progression in lungs, liver and peritoneum. I recommended her to change treatment. -She started second line single agent irinotecan on 06/03/16 at a reduced dose '150mg'$ /m2, tolerated well. -The goal of therapy is palliative, mainly to improve her quality of life. - PET Scan image from 08/09/16 showed good partial response to treatment. We will continue irinotecan and Avastin. -We previously discussed pt being able to take chemo break and she will notify us if needed  -I will hold Avastin if her urine protein 300 or above  -We discussed her 11/25/16 PET scan. She has small increase in size of some of her small lung nodules, and mild increased FDG of her liver mets, but overall stable disease. I think the benefit of current treatment is getting smaller. We may need to change treatment in 2 months after a repeat scan. Will continue Irinotecan and avastin for now. She agrees.  -We will consider changing her treatment to 5-fu weekly in the future if she has disease progresses -I have low threshold to stop her chemotherapy due to her comorbidities, especially dementia, if she develops more symptoms or intolerance due to chemotherapy treatment. -The daughter and I previously discussed  trying to get advanced home care to help pt and what type of help she is seeking. I also mentioned the options of physical  therapy to help with her fatigue.  -I previously discussed she is tolerating treatment well and can continue for as long as she can tolerate. Her daughter and family agree. -PET scan on 03/13/17 revealed that hepatic metastases are stable in size with stable to minimally decreased hypermetabolism, the inferior left rectus abdominus metastasis shows mildly decreased hypermetabolism, the omental nodule no longer shows abnormal hypermetabolism. Scattered pulmonary nodules are stable in size and too small for PET resolution. 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 Previously discussed with pt and daughter. They are very pleased. -Her her urine proteins were prviously elevated at above 300 and Avastin has been held intermittently since 03/17/17 if urine protein >=300.  -labs reviewed, adeqaute proceed with Irinotecan and Avastin held today due to proteinuria. Will check at next visit lab.  -PET Scan 07/03/2017 reviewed mild disease progression, with mainly increased FDG uptake, the overall metastatic lesion sizes have not changed significantly.  -We discussed the option of stop chemo, or continue irinotecan for another 2 to 3 months and restaging.  I think the effectiveness of irinotecan  has decreased.  Given her excellent tolerance, clinically asymptomatic, we decided to continue chemo for now.  -If she has further disease progression, I will stop chemo treatment.  I do not recommend saline chemo, due to the low benefit and her medical conditions especially dementia. -We also discussed if she experience more side effects from chemo, or becomes more symptomatic from cancer, will stop chemo. -F/u in  2 weeks before next cycle   2. CKD stage III, right hydroureter  -Her Cr was around 1 in 08/2013, Cr increased to 2.4 on 01/20/2015 when she was diagnosed with colon cancer due to dehydration, improved some and moved around.3-1.5 .  -A previous CT scan showed right hydroureter, which probably contributes to  her renal insufficiency. -she had ureter stent placement again in 12/2015, 03/2016, 04/2016, and 04/2017 -Cr is overall stable   3. HTN, dementia  -She will continue her medication and follow-up with her primary care physician. -Her BP has been better controlled lately, we previously discussed that Avastin can cause hypertension, we'll continue monitoring closely. -Her dementia has got worse gradually, which has increased her care burden on her daughter. I previously discussed burn-out prevention with her daughter, she appreciated and has planned to have a care-giver for her mother  -Dementia better on Seroquel    4. Nausea and diarrhea -Secondary to chemotherapy, overall very mild and manageable  -She is to take Imodium PRN and her nausea medications. -Nausea has improved and diarrhea has resolved.   5. Goal of care discussion  -We previously discussed the incurable nature of her cancer, and the overall poor prognosis, especially if she has progress on chemo -The patient understands the goal of care is palliative. -Due to her advanced age and comorbidities, especially dementia, I have low threshold to switch her to palliative care alone if she does not tolerate chemo well or dementia gets worse, her daughter is on -board  -I recommend DNR/DNI, pt and her daughter agrees.  -We reviewed what help she could get in home aid service or hospice. They plan to continue with chemotherapy as long as she can tolerate it. I will contact home care service and see if her insurance will cover it.  -Based on consult with socal worker, she will wait on  searching for further help  6. Insomnia  -I first suggested melatonin over-the counter and to check with her neurologist.  -If that does not help she can use prescription medication.  -Suggested she increase benadryl to 1.5-2 tabs as needed. She will try white noise machine for non-pharm intervention. -Much better sleeping on Seroquel    7. Social  support -Due to her dementia and chemotherapy, she requires 24/7 care at home.  Her daughter is her only caregiver. -I will refer her to advanced home care for nursing, physical therapy, occupational therapy and home aids for bathing etc, referral was made today  -Social worker referral to discuss additional social services for pt  -She has a caregiver now that is coming by 2 times a week, daughter is planning to increase to 3-4 days a week    Plan -Lab and scan reviewed with patient and her daughter.  We will continue to take every 2 weeks  -lab, flush, f/u and chemo irinotecan in 2, 4 and 6 weeks   All questions were answered. The patient knows to call the clinic with any problems, questions or concerns.  I spent 30 minutes counseling the patient face to face. The total time spent in the appointment was 35 minutes and more than 50% was on counseling.  This document serves as a record of services personally performed by [name], MD. It was created on her behalf by Margit Banda, a trained medical scribe. The creation of this record is based on the scribe's personal observations and the provider's statements to them.   I have reviewed the above documentation for accuracy and completeness, and I agree with the above.   [name]  07/07/2017 5:01 PM

## 2017-07-07 NOTE — Patient Instructions (Signed)

## 2017-07-07 NOTE — Telephone Encounter (Signed)
Scheduled appt per 5/3 los - pt to get an updated schedule in the treatment area.

## 2017-07-10 ENCOUNTER — Encounter: Payer: Self-pay | Admitting: Hematology

## 2017-07-12 ENCOUNTER — Telehealth: Payer: Self-pay

## 2017-07-12 NOTE — Telephone Encounter (Signed)
Faxed referral information to [facility] [facility] on 07/12/17 at 12:09.  Called daughter and informed her, left voice message.

## 2017-07-13 ENCOUNTER — Other Ambulatory Visit: Payer: Self-pay | Admitting: *Deleted

## 2017-07-13 ENCOUNTER — Other Ambulatory Visit: Payer: Self-pay

## 2017-07-13 DIAGNOSIS — C182 Malignant neoplasm of ascending colon: Secondary | ICD-10-CM

## 2017-07-13 NOTE — Progress Notes (Signed)
R

## 2017-07-13 NOTE — Progress Notes (Signed)
error 

## 2017-07-17 ENCOUNTER — Telehealth: Payer: Self-pay

## 2017-07-17 NOTE — Telephone Encounter (Signed)
Signed order for [facility] faxed 07/17/17.

## 2017-07-18 MED FILL — MIRTAZAPINE 15 MG TABLET: 15 | 30 days supply | Qty: 30 | Fill #2

## 2017-07-18 MED FILL — MYRBETRIQ ER 50 MG TABLET: 50 | 30 days supply | Qty: 30 | Fill #3

## 2017-07-18 MED FILL — QUETIAPINE 25 MG TABLET: 25 | 30 days supply | Qty: 30 | Fill #6

## 2017-07-21 ENCOUNTER — Inpatient Hospital Stay (HOSPITAL_BASED_OUTPATIENT_CLINIC_OR_DEPARTMENT_OTHER): Payer: Medicare Other | Admitting: Nurse Practitioner

## 2017-07-21 ENCOUNTER — Inpatient Hospital Stay: Payer: Medicare Other

## 2017-07-21 ENCOUNTER — Encounter: Payer: Self-pay | Admitting: Nurse Practitioner

## 2017-07-21 VITALS — BP 145/80 | HR 94 | Temp 98.3°F | Resp 17 | Ht 65.0 in | Wt 112.8 lb

## 2017-07-21 DIAGNOSIS — J432 Centrilobular emphysema: Secondary | ICD-10-CM | POA: Diagnosis not present

## 2017-07-21 DIAGNOSIS — Z8041 Family history of malignant neoplasm of ovary: Secondary | ICD-10-CM

## 2017-07-21 DIAGNOSIS — E785 Hyperlipidemia, unspecified: Secondary | ICD-10-CM

## 2017-07-21 DIAGNOSIS — I2510 Atherosclerotic heart disease of native coronary artery without angina pectoris: Secondary | ICD-10-CM | POA: Diagnosis not present

## 2017-07-21 DIAGNOSIS — D638 Anemia in other chronic diseases classified elsewhere: Secondary | ICD-10-CM

## 2017-07-21 DIAGNOSIS — I129 Hypertensive chronic kidney disease with stage 1 through stage 4 chronic kidney disease, or unspecified chronic kidney disease: Secondary | ICD-10-CM

## 2017-07-21 DIAGNOSIS — Z79899 Other long term (current) drug therapy: Secondary | ICD-10-CM

## 2017-07-21 DIAGNOSIS — I10 Essential (primary) hypertension: Secondary | ICD-10-CM

## 2017-07-21 DIAGNOSIS — Z7982 Long term (current) use of aspirin: Secondary | ICD-10-CM

## 2017-07-21 DIAGNOSIS — I700 Atherosclerosis of aorta: Secondary | ICD-10-CM

## 2017-07-21 DIAGNOSIS — C787 Secondary malignant neoplasm of liver and intrahepatic bile duct: Secondary | ICD-10-CM | POA: Diagnosis not present

## 2017-07-21 DIAGNOSIS — C182 Malignant neoplasm of ascending colon: Secondary | ICD-10-CM

## 2017-07-21 DIAGNOSIS — F0390 Unspecified dementia without behavioral disturbance: Secondary | ICD-10-CM | POA: Diagnosis not present

## 2017-07-21 DIAGNOSIS — D259 Leiomyoma of uterus, unspecified: Secondary | ICD-10-CM | POA: Diagnosis not present

## 2017-07-21 DIAGNOSIS — N189 Chronic kidney disease, unspecified: Secondary | ICD-10-CM

## 2017-07-21 DIAGNOSIS — F329 Major depressive disorder, single episode, unspecified: Secondary | ICD-10-CM | POA: Diagnosis not present

## 2017-07-21 DIAGNOSIS — Z5111 Encounter for antineoplastic chemotherapy: Secondary | ICD-10-CM | POA: Diagnosis not present

## 2017-07-21 LAB — COMPREHENSIVE METABOLIC PANEL
ALK PHOS: 85 U/L (ref 40–150)
ALT: 11 U/L (ref 0–55)
AST: 18 U/L (ref 5–34)
Albumin: 3.5 g/dL (ref 3.5–5.0)
Anion gap: 9 (ref 3–11)
BILIRUBIN TOTAL: 0.3 mg/dL (ref 0.2–1.2)
BUN: 13 mg/dL (ref 7–26)
CALCIUM: 8.7 mg/dL (ref 8.4–10.4)
CHLORIDE: 106 mmol/L (ref 98–109)
CO2: 23 mmol/L (ref 22–29)
CREATININE: 1.36 mg/dL — AB (ref 0.60–1.10)
GFR calc Af Amer: 43 mL/min — ABNORMAL LOW (ref >60)
GFR calc non Af Amer: 37 mL/min — ABNORMAL LOW (ref >60)
Glucose, Bld: 92 mg/dL (ref 70–140)
Potassium: 4.1 mmol/L (ref 3.5–5.1)
Sodium: 138 mmol/L (ref 136–145)
Total Protein: 7.1 g/dL (ref 6.4–8.3)

## 2017-07-21 LAB — CBC WITH DIFFERENTIAL/PLATELET
BASOS PCT: 1 %
Basophils Absolute: 0.1 10*3/uL (ref 0.0–0.1)
EOS ABS: 0.2 10*3/uL (ref 0.0–0.5)
EOS PCT: 2 %
HCT: 33.8 % — ABNORMAL LOW (ref 34.8–46.6)
HEMOGLOBIN: 11.2 g/dL — AB (ref 11.6–15.9)
LYMPHS ABS: 1.1 10*3/uL (ref 0.9–3.3)
Lymphocytes Relative: 15 %
MCH: 29.6 pg (ref 25.1–34.0)
MCHC: 33 g/dL (ref 31.5–36.0)
MCV: 89.5 fL (ref 79.5–101.0)
Monocytes Absolute: 0.6 10*3/uL (ref 0.1–0.9)
Monocytes Relative: 9 %
Neutro Abs: 5.3 10*3/uL (ref 1.5–6.5)
Neutrophils Relative %: 73 %
PLATELETS: 320 10*3/uL (ref 145–400)
RBC: 3.78 MIL/uL (ref 3.70–5.45)
RDW: 19.3 % — ABNORMAL HIGH (ref 11.2–14.5)
WBC: 7.2 10*3/uL (ref 3.9–10.3)

## 2017-07-21 LAB — TOTAL PROTEIN, URINE DIPSTICK: PROTEIN: 100 mg/dL — AB

## 2017-07-21 MED ORDER — PALONOSETRON HCL INJECTION 0.25 MG/5ML
0.2500 mg | Freq: Once | INTRAVENOUS | Status: AC
  Administered 2017-07-21: 0.25 mg via INTRAVENOUS

## 2017-07-21 MED ORDER — ATROPINE SULFATE 1 MG/ML IJ SOLN
INTRAMUSCULAR | Status: AC
  Filled 2017-07-21: qty 1

## 2017-07-21 MED ORDER — ATROPINE SULFATE 1 MG/ML IJ SOLN
0.5000 mg | Freq: Once | INTRAMUSCULAR | Status: AC | PRN
  Administered 2017-07-21: 0.5 mg via INTRAVENOUS

## 2017-07-21 MED ORDER — SODIUM CHLORIDE 0.9% FLUSH
10.0000 mL | Freq: Once | INTRAVENOUS | Status: AC
  Administered 2017-07-21: 10 mL
  Filled 2017-07-21: qty 10

## 2017-07-21 MED ORDER — DEXAMETHASONE SODIUM PHOSPHATE 10 MG/ML IJ SOLN
10.0000 mg | Freq: Once | INTRAMUSCULAR | Status: AC
  Administered 2017-07-21: 10 mg via INTRAVENOUS

## 2017-07-21 MED ORDER — PALONOSETRON HCL INJECTION 0.25 MG/5ML
INTRAVENOUS | Status: AC
  Filled 2017-07-21: qty 5

## 2017-07-21 MED ORDER — SODIUM CHLORIDE 0.9% FLUSH
10.0000 mL | INTRAVENOUS | Status: DC | PRN
  Administered 2017-07-21: 10 mL
  Filled 2017-07-21: qty 10

## 2017-07-21 MED ORDER — IRINOTECAN HCL CHEMO INJECTION 100 MG/5ML
150.0000 mg/m2 | Freq: Once | INTRAVENOUS | Status: AC
  Administered 2017-07-21: 240 mg via INTRAVENOUS
  Filled 2017-07-21: qty 12

## 2017-07-21 MED ORDER — HEPARIN SOD (PORK) LOCK FLUSH 100 UNIT/ML IV SOLN
500.0000 [IU] | Freq: Once | INTRAVENOUS | Status: AC | PRN
Start: 2017-07-21 — End: 2017-07-21
  Administered 2017-07-21: 500 [IU]
  Filled 2017-07-21: qty 5

## 2017-07-21 MED ORDER — SODIUM CHLORIDE 0.9 % IV SOLN
5.0000 mg/kg | Freq: Once | INTRAVENOUS | Status: AC
  Administered 2017-07-21: 275 mg via INTRAVENOUS
  Filled 2017-07-21: qty 11

## 2017-07-21 MED ORDER — DEXAMETHASONE SODIUM PHOSPHATE 10 MG/ML IJ SOLN
INTRAMUSCULAR | Status: AC
  Filled 2017-07-21: qty 1

## 2017-07-21 MED ORDER — SODIUM CHLORIDE 0.9 % IV SOLN
Freq: Once | INTRAVENOUS | Status: AC
  Administered 2017-07-21: 12:00:00 via INTRAVENOUS

## 2017-07-21 NOTE — Patient Instructions (Signed)
[facility] Discharge Instructions for Patients Receiving Chemotherapy  Today you received the following chemotherapy agents Irinotecan, Avastin  To help prevent nausea and vomiting after your treatment, we encourage you to take your nausea medication as directed   If you develop nausea and vomiting that is not controlled by your nausea medication, call the clinic.   BELOW ARE SYMPTOMS THAT SHOULD BE REPORTED IMMEDIATELY:  *FEVER GREATER THAN 100.5 F  *CHILLS WITH OR WITHOUT FEVER  NAUSEA AND VOMITING THAT IS NOT CONTROLLED WITH YOUR NAUSEA MEDICATION  *UNUSUAL SHORTNESS OF BREATH  *UNUSUAL BRUISING OR BLEEDING  TENDERNESS IN MOUTH AND THROAT WITH OR WITHOUT PRESENCE OF ULCERS  *URINARY PROBLEMS  *BOWEL PROBLEMS  UNUSUAL RASH Items with * indicate a potential emergency and should be followed up as soon as possible.  Feel free to call the clinic should you have any questions or concerns. The clinic phone number is [phone].  Please show the CHEMO ALERT CARD at check-in to the Emergency Department and triage nurse.   

## 2017-07-21 NOTE — Progress Notes (Signed)
[place]  [phone] [phone]  Clinic Follow up Note   Patient Care Team: [name], MD as PCP - General (Family Medicine) [name], MD as Consulting Physician (Medical Oncology) [name], MD as Consulting Physician (General Surgery) [name], MD as Consulting Physician (Orthopedic Surgery) [name] [name], MD as Consulting Physician (Urology) 07/21/2017  SUMMARY OF ONCOLOGIC HISTORY: Oncology History   Cancer of right colon [facility])   Staging form: Colon and Rectum, AJCC 7th Edition     Pathologic stage from 01/23/2015: Stage IIC (T4b, N0, cM0) - Signed by [name], MD on 02/05/2015       Cancer of right colon ([place])   01/21/2015 Imaging     CT abdomen and pelvis showed high-grade small bowel obstruction,  right I [place] ureteral nephrosis,  multiple liver cysts.  CT chest was negative for metastatic lesion.      01/23/2015 Pathology Results     invasive adenocarcinoma extending into the pericecal connective tissue,  grade 2, LVI (-),  no perineural invasion, 3 lymph nodes were negative.      01/23/2015 Surgery      terminal ileum and cecum seegmental resection with anastomosis by Dr. Hassell Done      01/23/2015 Initial Diagnosis    Cancer of right colon ([place])      03/13/2015 - 07/06/2015 Chemotherapy    Xeloda 1500 mg bid X 14 days on-7 off, s/p 5 cycles, stopped early due to wrosening renal function      10/28/2015 Progression    PET scan showed multiple hypermetabolic peritoneal nodules, right ovarian cystic mass, and hypermetabolic liver lesion, highly suspicious for metastatic colon cancer. Small pulmonary nodules are indeterminate.       11/11/2015 - 05/20/2016 Chemotherapy    Xeloda 1540m bid, 2 weeks on and one week off, Avastin was added on from cycle 3. Xeloda held from 05/20/16 due to progression on CT from 05/19/16 and total bilirubin increased to 2.6.      01/08/2016 Imaging    Ct chest, Abdomen Pelvis  Wo Contrast 01/08/2016  IMPRESSION: 1. 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 2. Hepatic and renal cysts. 3.  Aortoiliac atherosclerotic vascular disease. 4. Lumbar spondylosis and degenerative disc disease with multilevel Impingement.      05/19/2016 Imaging    CT CAP w Contrast IMPRESSION: 1. Numerous small solid pulmonary nodules, one of which is new, and several of which have mildly increased in size since 10/28/2015, worrisome for mild progression of pulmonary metastases. 2. Solitary right liver lobe metastasis is mildly increased in size since 01/08/2016. 3. Peritoneal metastases have mildly increased in size since 01/08/2016, largest in the right pelvic sidewall. 4. Well-positioned right nephroureteral stent without overt right hydronephrosis. 5. Additional findings include aortic atherosclerosis, 1 vessel coronary atherosclerosis and mild to moderate emphysema.      06/03/2016 -  Chemotherapy    Second-Line Irinotecan and Avastin every 2 weeks starting 06/03/16      07/29/2016 Tumor Marker    CEA 6.51       08/09/2016 Imaging    PET  IMPRESSION: 1. Overall mild improvement compared to FDG PET scan of 10/28/2015. 2. Pulmonary nodules are increased in size from PET-CT scan and similar size to most recent CT scan. Largest lesion RIGHT lower lobe does have associated faint metabolic activity consistent with pulmonary metastasis. 3. Decrease in metabolic activity of RIGHT hepatic lobe lesion. Potential second lesion LEFT hepatic lobe with  slightly lower metabolic activity. 4. Decreased metabolic activity at the enteric colonic anastomosis in the RIGHT lower quadrant. 5. Interval decrease in metabolic activity of peritoneal nodular metastasis. 6. Nodular implant implant along ventral abdominal wall may be within the musculature, but also improved.      09/09/2016 Tumor Marker    CEA: 7.47      10/07/2016  Tumor Marker    CEA: 7.85      11/04/2016 Tumor Marker    CEA: 7.75      11/25/2016 PET scan    PET 11/25/16 IMPRESSION: 1. Interval worsening in the interval. Numerous pulmonary nodules are again seen. While multiple nodules are stable, there are several nodules which are a little larger in the interval. They remain too small to completely characterize with PET-CT. The 2 hepatic metastases demonstrate increased FDG uptake on today's study compared to the previous study. Abdominal wall and peritoneal metastases are mildly worsened.      12/02/2016 Tumor Marker    CEA: 8.13      01/06/2017 Tumor Marker    CEA: 10.36      02/03/2017 Tumor Marker    CEA: 12.60      03/03/2017 Tumor Marker    CEA: 9.96      03/13/2017 PET scan    IMPRESSION: 1. Hepatic metastases are stable in size with stable to minimally decreased hypermetabolism. 2. Inferior left rectus abdominus metastasis shows mildly decreased hypermetabolism. 3. Omental nodule no longer shows abnormal hypermetabolism. 4. Scattered pulmonary nodules are stable in size and too small for PET resolution. 5.  Aortic atherosclerosis (ICD10-170.0). 6.  Emphysema (ICD10-J43.9).      07/03/2017 Imaging    PET, 07/03/2017 IMPRESSION: 1. General mild worsening, with increased activity in the hepatic metastatic lesions, mesenteric and omental nodularity, and with new/worsening metastatic nodularity in the right lower quadrant below the cecum. 2. There are over 20 small pulmonary nodules which are below sensitive PET-CT size thresholds and stable from the prior exam, but not currently hypermetabolic. 3. Other imaging findings of potential clinical significance: Aortic Atherosclerosis (ICD10-I70.0) and Emphysema (ICD10-J43.9). Coronary atherosclerosis. Uterine fibroid. Small amount of free pelvic fluid in the cul-de-sac. Old pelvic deformities from prior fractures. Degenerative hip and glenohumeral arthropathy. Spondylosis.       CURRENT THERAPY: Second-Line Irinotecan and Avastin every 2 weeks started 06/03/16, Avastin held on 03/17/2017 due to proteinuria   INTERVAL HISTORY: Returns for follow-up and next cycle Irinotecan as scheduled.  She completed last cycle on 07/07/2017. Avastin was held for proteinuria. She drank mag citrate last week and had a "massive cleanout" BM, stool have been formed since. She continues to report mild fatigue. Good appetite despite weight loss. Daughter attributes weight loss to large BM. No nausea or vomiting. Takes melatonin at night to aid sleep. Daughter has found that a glass of wine occasionally in the evening helps with PM agitation secondary to dementia. She is also asking if ok to take CBD oil and "kefir" probiotic. No other changes to report.   REVIEW OF SYSTEMS:   Constitutional: Denies fevers, chills (+) 4 lbs weight loss (+) fatigue  eyes: Denies blurriness of vision Ears, nose, mouth, throat, and face: Denies mucositis or sore throat Respiratory: Denies cough, dyspnea or wheezes Cardiovascular: Denies palpitation, chest discomfort or lower extremity swelling Gastrointestinal:  Denies nausea, vomiting, constipation, diarrhea, abdominal pain, bleeding, heartburn or change in bowel habits (+) large BM with mag citrate Skin: Denies abnormal skin rashes Lymphatics: Denies new lymphadenopathy or easy bruising  Neurological:Denies numbness, tingling or new weaknesses Behavioral/Psych: Mood is stable, no new changes (+) PM agitation secondary to dementia (+) melatonin for sleep aid All other systems were reviewed with the patient and are negative.  MEDICAL HISTORY:  Past Medical History:  Diagnosis Date  . Anxiety   . Aortic atherosclerosis ([place]) 03/15/2017   noted on PET   . Arthritis   . Bilateral renal cysts   . Centrilobular emphysema ([place]) per 05-10-16 chest xray  . CKD (chronic kidney disease), stage III ([place])   . Colon cancer [facility]) oncologist-  dr [name]   dx  01-23-2015  right colon Invasive adenocarcinoma into pericecal connective tissue and involving small intestine , Stage IIc, Grade 2, LVI(-),  (pT4b N0 M0) /  s/p resection termial ileum and cecum and chemotherapy 01-06-217 to 07-06-2015/   08/ 2017  per PET  recurrent w/ liver mets and peritoneal carcinomatosis  . Complication of anesthesia    can be combative due to dementia  . Full dentures   . Headache    occ stress related  . History of chemotherapy    every other friday currently receiving  . History of gastroesophageal reflux (GERD)    changes diet  . History of pelvic fracture 2015  . Hyperlipidemia   . Hypertension   . Liver metastasis ([place])   . 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 N&V (nausea and vomiting)   . Pulmonary nodules   . Right ovarian cyst   . SBO (small bowel obstruction) ([place]) 01/2015  . 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 UTI (urinary tract infection) 09/26/2016   5 day antibiotic treatment    SURGICAL HISTORY: Past Surgical History:  Procedure Laterality Date  . CATARACT EXTRACTION W/ INTRAOCULAR LENS  IMPLANT, BILATERAL  2005 approx  . CYSTOSCOPY W/ URETERAL STENT PLACEMENT Right 12/03/2015   Procedure: CYSTOSCOPY WITH RETROGRADE PYELOGRAM/URETERAL STENT PLACEMENT;  Surgeon: Cleon Gustin, MD;  Location: [facility];  Service: Urology;  Laterality: Right;  . CYSTOSCOPY W/ URETERAL STENT PLACEMENT Right 02/15/2016   Procedure: CYSTOSCOPY WITH RETROGRADE PYELOGRAM/URETERAL STENT REPLACEMENT;  Surgeon: Cleon Gustin, MD;  Location: [facility];  Service: Urology;  Laterality: Right;  . CYSTOSCOPY W/ URETERAL STENT PLACEMENT Right 03/28/2016   Procedure: CYSTOSCOPY WITH RETROGRADE PYELOGRAM/URETERAL STENT EXCHANGE;  Surgeon:  Cleon Gustin, MD;  Location: [facility];  Service: Urology;  Laterality: Right;  . CYSTOSCOPY W/ URETERAL STENT PLACEMENT Right 07/18/2016   Procedure: CYSTOSCOPY WITH RETROGRADE PYELOGRAM/ STENT EXCHANGE;  Surgeon: Cleon Gustin, MD;  Location: [facility];  Service: Urology;  Laterality: Right;  . CYSTOSCOPY W/ URETERAL STENT PLACEMENT Right 10/10/2016   Procedure: CYSTOSCOPY WITH RETROGRADE PYELOGRAM/URETERAL STENT REPLACEMENT;  Surgeon: Cleon Gustin, MD;  Location: [facility];  Service: Urology;  Laterality: Right;  . CYSTOSCOPY W/ URETERAL STENT PLACEMENT Right 04/10/2017   Procedure: CYSTOSCOPY WITH RETROGRADE PYELOGRAM/URETERAL STENT EXCHANGE;  Surgeon: Cleon Gustin, MD;  Location: [facility];  Service: Urology;  Laterality: Right;  . CYSTOSCOPY WITH RETROGRADE PYELOGRAM, URETEROSCOPY AND STENT PLACEMENT Right 09/21/2015   Procedure: CYSTOSCOPY WITH BIL RETROGRADE PYELOGRAM, URETEROSCOPY, RIGHT STENT PLACEMENT ;  Surgeon: Cleon Gustin, MD;  Location: [facility];  Service: Urology;  Laterality: Right;  .  HEMICOLECTOMY Right 01/23/2015   Dr. Hassell Done  . IR FLUORO GUIDE PORT INSERTION RIGHT  03/24/2017  . IR US GUIDE VASC ACCESS RIGHT  03/24/2017  . LAPAROSCOPY N/A 01/23/2015   Procedure: DIAGNOSTIC LAPAROSCOPY, EXPLORATORY LAPAROTOMY  RIGHT HEMI-COLECTOMY FOR OBSTRUCTION OF RIGHT TERMINAL ILEUM;  Surgeon: [name], MD;  Location: [facility];  Service: General;  Laterality: N/A;    I have reviewed the social history and family history with the patient and they are unchanged from previous note.  ALLERGIES:  is allergic to namenda [memantine hcl] and oxycodone.  MEDICATIONS:  Current Outpatient Medications  Medication Sig Dispense Refill  . amLODipine (NORVASC) 5 MG tablet Take 5 mg by mouth every morning.     Marland Kitchen aspirin 81 MG tablet Take 81 mg by mouth daily.    . Cholecalciferol  (VITAMIN D3) 2000 UNITS TABS Take by mouth daily.    . Coenzyme Q10 (CO Q 10) 100 MG CAPS Take by mouth daily.    Marland Kitchen lidocaine-prilocaine (EMLA) cream Apply 1 application topically as needed. 30 g 5  . Melatonin 5 MG TABS Take 5-15 mg by mouth at bedtime as needed.    . Meth-Hyo-M Bl-Na Phos-Ph Sal (URIBEL) 118 MG CAPS Take 1 capsule (118 mg total) by mouth 2 (two) times daily as needed. 30 capsule 3  . mirabegron ER (MYRBETRIQ) 50 MG TB24 tablet Take 1 tablet (50 mg total) by mouth daily. 30 tablet 11  . mirtazapine (REMERON) 15 MG tablet TAKE 1 TABLET BY MOUTH AT BEDTIME 30 tablet 3  . Multiple Vitamins-Minerals (WOMENS MULTIVITAMIN PO) Take 1 tablet by mouth daily. With iron    . polyethylene glycol (MIRALAX / GLYCOLAX) packet Take 17 g by mouth daily. (Patient taking differently: Take 17 g by mouth daily as needed. ) 14 each 0  . prochlorperazine (COMPAZINE) 5 MG tablet Take 1 tablet (5 mg total) by mouth every 6 (six) hours as needed for nausea or vomiting. 30 tablet 0  . QUEtiapine (SEROQUEL) 25 MG tablet Take 1 tablet (25 mg total) by mouth at bedtime. Take 1/2 tablet at night for 1 week, then increase to 1 tablet at night 30 tablet 6  . traMADol (ULTRAM) 50 MG tablet Take 1 tablet (50 mg total) by mouth every 6 (six) hours as needed. 30 tablet 0  . UNABLE TO FIND Med Name: Closys Mouth Rinse two-three times daily for ulcer.    . vitamin B-12 (CYANOCOBALAMIN) 1000 MCG tablet Take 1,000 mcg by mouth daily.     No current facility-administered medications for this visit.    Facility-Administered Medications Ordered in Other Visits  Medication Dose Route Frequency Provider Last Rate Last Dose  . heparin lock flush 100 unit/mL  500 Units Intravenous Once [name], MD      . heparin lock flush 100 unit/mL  500 Units Intracatheter Once PRN [name], MD      . irinotecan (CAMPTOSAR) 240 mg in dextrose 5 % 500 mL chemo infusion  150 mg/m2 (Treatment Plan Recorded) Intravenous Once [name], MD  341 mL/hr at 07/21/17 1257 240 mg at 07/21/17 1257  . sodium chloride flush (NS) 0.9 % injection 10 mL  10 mL Intracatheter PRN [name], MD        PHYSICAL EXAMINATION: ECOG PERFORMANCE STATUS: 1 - Symptomatic but completely ambulatory  Vitals:   07/21/17 1008  BP: (!) 145/80  Pulse: 94  Resp: 17  Temp: 98.3 F (36.8 C)  SpO2: 94%   Filed Weights  07/21/17 1008  Weight: 112 lb 12.8 oz (51.2 kg)    GENERAL:alert, no distress and comfortable SKIN: no rashes or significant lesions EYES: normal, Conjunctiva are pink and non-injected, sclera clear OROPHARYNX:no thrush or ulcers.  Discolored tongue LYMPH:  no palpable cervical lymphadenopathy LUNGS: clear to auscultation with normal breathing effort HEART: regular rate & rhythm and no murmurs and no lower extremity edema ABDOMEN:abdomen soft, non-tender and normal bowel sounds Musculoskeletal:no cyanosis of digits and no clubbing  NEURO: alert & oriented x 3 with fluent speech, no focal motor/sensory deficits PAC without erythema   LABORATORY DATA:  I have reviewed the data as listed CBC Latest Ref Rng & Units 07/21/2017 07/07/2017 06/23/2017  WBC 3.9 - 10.3 K/uL 7.2 5.5 6.2  Hemoglobin 11.6 - 15.9 g/dL 11.2(L) 11.1(L) 10.5(L)  Hematocrit 34.8 - 46.6 % 33.8(L) 34.6(L) 32.1(L)  Platelets 145 - 400 K/uL 320 324 274     CMP Latest Ref Rng & Units 07/21/2017 07/07/2017 06/23/2017  Glucose 70 - 140 mg/dL 92 134 115  BUN 7 - 26 mg/dL _0 Creatinine 0.60 - 1.10 mg/dL 1.36(H) 1.45(H) 1.32(H)  Sodium 136 - 145 mmol/L 138 141 138  Potassium 3.5 - 5.1 mmol/L 4.1 4.1 4.1  Chloride 98 - 109 mmol/L 106 109 107  CO2 22 - 29 mmol/L _1 Calcium 8.4 - 10.4 mg/dL 8.7 9.2 8.8  Total Protein 6.4 - 8.3 g/dL 7.1 6.9 6.7  Total Bilirubin 0.2 - 1.2 mg/dL 0.3 0.4 <0.2(L)  Alkaline Phos 40 - 150 U/L 85 76 75  AST 5 - 34 U/L _2 ALT 0 - 55 U/L 11 <6 10    CEA  01/23/2015: 5.7 06/05/2015: 6.1 08/04/2015: 10.3 10/12/2015: 13.29  (in-house)  11/04/2015: 12.83 01/13/2016: 8.57 02/24/2016: 8.41 04/27/2016: 9.22 05/20/2016: 8.32 06/17/2016: 6.45 07/15/2016: 5.07 07/29/2016: 6.51 09/09/16: 7.47 10/07/16: 7.85 11/04/16: 7.75 12/02/16: 8.13 01/06/17: 10.36 02/03/17: 12.60 03/03/18: 9.96  03/31/17: 12.7 04/14/17: 11.93 05/12/17: 13.00 06/09/17: 14.97 07/07/17: 15.22   PATHOLOGY REPORT: Diagnosis 01/23/2015 Colon, segmental resection, with terminal ileum and cecum - INVASIVE ADENOCARCINOMA EXTENDING INTO PERICECAL CONNECTIVE TISSUE AND INVOLVING SMALL INTESTINE. - THREE BENIGN LYMPH NODES (0/3). - SEE ONCOLOGY TABLE BELOW.  Microscopic Comment COLON AND RECTUM (INCLUDING TRANS-ANAL RESECTION): Specimen: Terminal ileum and cecum. Procedure: Resection. Tumor site: Cecum adjacent to appendix. Specimen integrity: Intact. Macroscopic intactness of mesorectum: Not applicable. Macroscopic tumor perforation: No. Invasive tumor: Maximum size: 4.0 cm. Histologic type(s): Colorectal adenocarcinoma. Histologic grade and differentiation: G2: moderately differentiated/low grade. Type of polyp in which invasive carcinoma arose: No residual polyp. Microscopic extension of invasive tumor: Into pericecal connective tissue and terminal ileum. Lymph-Vascular invasion: Not identified. Peri-neural invasion: Not identified. Tumor deposit(s) (discontinuous extramural extension): No. Resection margins: Proximal margin: Free of tumor. Distal margin: Free of tumor. Circumferential (radial) (posterior ascending, posterior descending; lateral and posterior mid-rectum; and entire lower 1/3 rectum): Free of tumor. Mesenteric margin (sigmoid and transverse): N/A. Distance closest margin (if all above margins negative): N/A. Treatment effect (neo-adjuvant therapy): No. Additional polyp(s): No. Non-neoplastic findings: N/A. Lymph nodes: number examined 3; number positive: 0. Pathologic Staging: pT4b, pN0, pMX. Ancillary studies: Mismatch  repair protein by immunohistochemistry pending. Comments: There is a colorectal-type adenocarcinoma arising in the cecum in the area of the appendiceal orifice. The tumor extends into the pericecal connective tissue, and involves the attached segment of terminal ileum. Immunohistochemistry shows the tumor is positive with CDX2 and cytokeratin 20, and negative with cytokeratin 7, estrogen  receptor and WT-1 supporting a diagnosis of primary colorectal adenocarcinoma. ADDITIONAL INFORMATION: Mismatch Repair (MMR) Protein Immunohistochemistry (IHC) IHC Expression Result: MLH1: Preserved nuclear expression (greater 50% tumor expression) MSH2: Preserved nuclear expression (greater 50% tumor expression) MSH6: Preserved nuclear expression (greater 50% tumor expression) PMS2: Preserved nuclear expression (greater 50% tumor expression) * Internal control demonstrates intact nuclear expression Interpretation: NORMAL There is preserved expression of the major and minor MMR proteins. There is a very low probability that microsatellite instability (MSI) is present. However, certain clinically significant MMR protein mutations may result in preservation of nuclear expression. It is recommended that the preservation of protein expression be correlated with molecular based MSI testing.      RADIOGRAPHIC STUDIES: I have personally reviewed the radiological images as listed and agreed with the findings in the report. No results found.   ASSESSMENT & PLAN: 77 y.o. African-American female, with past medical history of moderate dementia, independent with ADLs, hypertension, presents with small bowel obstruction and weight loss.  1. 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  CKD stage III, right hydroureter 3.  HTN  4.  Dementia 5.  Nausea and diarrhea 6.  Goals of care discussion 7.  Insomnia 8.  Social  support  Ms. Stoney appears stable. She completed cycle 29 Irinotecan on 06/23/17, avastin has been held intermittently due to proteinuria. She is tolerating treatment well overall. She has mild weight loss despite eating well, on remeron. She has no neurological changes. Urine protein is improved today, Cr is stable; CBC and CMP reviewed and adequate for treatment. Proceed with next cycle irinotecan/avastin today; continue q2 weeks.    She is fine to take probiotic. I advised her to avoid alcohol on days with chemotherapy and cautioned her about possible enhanced side effects/interactions with other medication; she uses alcohol sparingly. Her daughter plans to try CBD oil to see if it improves agitation and sleep.    Plan -Labs reviewed, proceed with next cycle irinotecan/avastin, continue q2 weeks -F/u with Dr. Burr Medico in 2 weeks   All questions were answered. The patient knows to call the clinic with any problems, questions or concerns. No barriers to learning was detected.     Alla Feeling, NP 07/21/17

## 2017-07-24 ENCOUNTER — Telehealth: Payer: Self-pay | Admitting: Nurse Practitioner

## 2017-07-24 NOTE — Telephone Encounter (Signed)
Appointments scheduled per 5/17 los

## 2017-08-02 MED FILL — AMLODIPINE BESYLATE 5 MG TA: 5 | 30 days supply | Qty: 30 | Fill #2

## 2017-08-03 ENCOUNTER — Other Ambulatory Visit: Payer: Self-pay | Admitting: *Deleted

## 2017-08-03 ENCOUNTER — Telehealth: Payer: Self-pay | Admitting: *Deleted

## 2017-08-03 DIAGNOSIS — C182 Malignant neoplasm of ascending colon: Secondary | ICD-10-CM

## 2017-08-03 NOTE — Progress Notes (Addendum)
Beth Wilkins  [phone] [phone]  Clinic Follow Up Note   Patient Care Team: [name], MD as PCP - General (Family Medicine) [name], MD as Consulting Physician (Medical Oncology) [name], MD as Consulting Physician (General Surgery) [name], MD as Consulting Physician (Orthopedic Surgery) [name] [name], MD as Consulting Physician (Urology)   Date of Service:  08/04/2017     CHIEF COMPLAINTS:  Follow up metastatic colon cancer  Oncology History   Cancer of right colon [facility])   Staging form: Colon and Rectum, AJCC 7th Edition     Pathologic stage from 01/23/2015: Stage IIC (T4b, N0, cM0) - Signed by [name], MD on 02/05/2015       Cancer of right colon ([place])   01/21/2015 Imaging     CT abdomen and pelvis showed high-grade small bowel obstruction,  right I [place] ureteral nephrosis,  multiple liver cysts.  CT chest was negative for metastatic lesion.      01/23/2015 Pathology Results     invasive adenocarcinoma extending into the pericecal connective tissue,  grade 2, LVI (-),  no perineural invasion, 3 lymph nodes were negative.      01/23/2015 Surgery      terminal ileum and cecum seegmental resection with anastomosis by Dr. Hassell Done      01/23/2015 Initial Diagnosis    Cancer of right colon ([place])      03/13/2015 - 07/06/2015 Chemotherapy    Xeloda 1500 mg bid X 14 days on-7 off, s/p 5 cycles, stopped early due to wrosening renal function      10/28/2015 Progression    PET scan showed multiple hypermetabolic peritoneal nodules, right ovarian cystic mass, and hypermetabolic liver lesion, highly suspicious for metastatic colon cancer. Small pulmonary nodules are indeterminate.       11/11/2015 - 05/20/2016 Chemotherapy    Xeloda 1581m bid, 2 weeks on and one week off, Avastin was added on from cycle 3. Xeloda held from 05/20/16 due to progression on CT from 05/19/16 and total bilirubin increased to 2.6.      01/08/2016 Imaging    Ct chest, Abdomen Pelvis Wo Contrast 01/08/2016  IMPRESSION: 1. 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 2. Hepatic and renal cysts. 3.  Aortoiliac atherosclerotic vascular disease. 4. Lumbar spondylosis and degenerative disc disease with multilevel Impingement.      05/19/2016 Imaging    CT CAP w Contrast IMPRESSION: 1. Numerous small solid pulmonary nodules, one of which is new, and several of which have mildly increased in size since 10/28/2015, worrisome for mild progression of pulmonary metastases. 2. Solitary right liver lobe metastasis is mildly increased in size since 01/08/2016. 3. Peritoneal metastases have mildly increased in size since 01/08/2016, largest in the right pelvic sidewall. 4. Well-positioned right nephroureteral stent without overt right hydronephrosis. 5. Additional findings include aortic atherosclerosis, 1 vessel coronary atherosclerosis and mild to moderate emphysema.      06/03/2016 -  Chemotherapy    Second-Line Irinotecan and Avastin every 2 weeks starting 06/03/16      07/29/2016 Tumor Marker    CEA 6.51       08/09/2016 Imaging    PET  IMPRESSION: 1. Overall mild improvement compared to FDG PET scan of 10/28/2015. 2. Pulmonary nodules are increased in size from PET-CT scan and similar size to most recent CT scan. Largest lesion RIGHT lower lobe does have associated faint metabolic activity consistent with pulmonary metastasis. 3. Decrease in metabolic  activity of RIGHT hepatic lobe lesion. Potential second lesion LEFT hepatic lobe with slightly lower metabolic activity. 4. Decreased metabolic activity at the enteric colonic anastomosis in the RIGHT lower quadrant. 5. Interval decrease in metabolic activity of peritoneal nodular metastasis. 6. Nodular implant implant along ventral abdominal wall may be within the musculature, but also improved.       09/09/2016 Tumor Marker    CEA: 7.47      10/07/2016 Tumor Marker    CEA: 7.85      11/04/2016 Tumor Marker    CEA: 7.75      11/25/2016 PET scan    PET 11/25/16 IMPRESSION: 1. Interval worsening in the interval. Numerous pulmonary nodules are again seen. While multiple nodules are stable, there are several nodules which are a little larger in the interval. They remain too small to completely characterize with PET-CT. The 2 hepatic metastases demonstrate increased FDG uptake on today's study compared to the previous study. Abdominal wall and peritoneal metastases are mildly worsened.      12/02/2016 Tumor Marker    CEA: 8.13      01/06/2017 Tumor Marker    CEA: 10.36      02/03/2017 Tumor Marker    CEA: 12.60      03/03/2017 Tumor Marker    CEA: 9.96      03/13/2017 PET scan    IMPRESSION: 1. Hepatic metastases are stable in size with stable to minimally decreased hypermetabolism. 2. Inferior left rectus abdominus metastasis shows mildly decreased hypermetabolism. 3. Omental nodule no longer shows abnormal hypermetabolism. 4. Scattered pulmonary nodules are stable in size and too small for PET resolution. 5.  Aortic atherosclerosis (ICD10-170.0). 6.  Emphysema (ICD10-J43.9).      07/03/2017 Imaging    PET, 07/03/2017 IMPRESSION: 1. General mild worsening, with increased activity in the hepatic metastatic lesions, mesenteric and omental nodularity, and with new/worsening metastatic nodularity in the right lower quadrant below the cecum. 2. There are over 20 small pulmonary nodules which are below sensitive PET-CT size thresholds and stable from the prior exam, but not currently hypermetabolic. 3. Other imaging findings of potential clinical significance: Aortic Atherosclerosis (ICD10-I70.0) and Emphysema (ICD10-J43.9). Coronary atherosclerosis. Uterine fibroid. Small amount of free pelvic fluid in the cul-de-sac. Old pelvic deformities from prior  fractures. Degenerative hip and glenohumeral arthropathy. Spondylosis.       HISTORY OF PRESENTING ILLNESS (02/05/2015):  Beth Wilkins 77 y.o. female  with past medical history of moderate dementia, hypertension, is here because of recently diagnosed stage II colon cancer. She is accompanied to the clinic by her daughter. History is mainly obtained from the chart and her daughter.  She has had abdominal discomfort, nausea and vomiting, and anorexia for  the past to 3 months along with 15 pounds weight loss, . Her nausea was mild and intermittent, she does not seek medical attention initially. She presented with  worsening symptoms for 2-3 weeks, and episodes of projectile vomiting and dehydration to emergency room, was admitted to [facility] on 01/20/2015. CT scan reviewed small bowel obstruction, and possible appendiceal rupture. She was brought to OR on 01/23/2015, underwent right hemicolectomy with anastomosis by Dr. Hassell Done. Surgical path reviewed adenocarcinoma at the cecum. She was discharged home on 01/26/2015.  She has recovered well, eating much better than prior to surgery, move around without restriction. No pain, she has loose BM 3 times a day, and she has backed off her miralax.  she lives with her daughter. She states her appetite is  moderate, sometime low, and she refuses to eat if she doesn't have appetite. No nausea, vomiting, abdominal bloating since her surgery.  Her last colonoscopy was 6-8 years ago, and her stool OB was negative this year.   CURRENT THERAPY: Second-Line Irinotecan and Avastin every 2 weeks started 06/03/16, Avastin held as needed due to proteinuria  INTERIM HISTORY:    LARENE ASCENCIO is here for a follow up and Irinotecan/Avastin. She is accompanied by her daughter. She notes she is doing well and advanced home care will start on in a few days to receive help from a home nurse, OT and PT. She still has caregiver 3 days out of the week. Her daughter is glad  this finally worked out. She notes her last chemo went well. She is still eating well but lighter foods in the summer. She gets to eat heavier on treatment night. Weight has been stable. Dr. Drema Dallas previously had her on iron and was taken off when her Hg recovered.     On review of symptoms, pt notes a darker spot on her lateral upper right thigh. Her daughter notes she is using sunscreen.     MEDICAL HISTORY:  Past Medical History:  Diagnosis Date  . Anxiety   . Aortic atherosclerosis ([place]) 03/15/2017   noted on PET   . Arthritis   . Bilateral renal cysts   . Centrilobular emphysema ([place]) per 05-10-16 chest xray  . CKD (chronic kidney disease), stage III ([place])   . Colon cancer [facility]) oncologist-  dr [name]   dx 01-23-2015  right colon Invasive adenocarcinoma into pericecal connective tissue and involving small intestine , Stage IIc, Grade 2, LVI(-),  (pT4b N0 M0) /  s/p resection termial ileum and cecum and chemotherapy 01-06-217 to 07-06-2015/   08/ 2017  per PET  recurrent w/ liver mets and peritoneal carcinomatosis  . Complication of anesthesia    can be combative due to dementia  . Full dentures   . Headache    occ stress related  . History of chemotherapy    every other friday currently receiving  . History of gastroesophageal reflux (GERD)    changes diet  . History of pelvic fracture 2015  . Hyperlipidemia   . Hypertension   . Liver metastasis ([place])   . 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 N&V (nausea and vomiting)   . Pulmonary nodules   . Right ovarian cyst   . SBO (small bowel obstruction) ([place]) 01/2015  . 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 UTI (urinary tract infection) 09/26/2016   5 day antibiotic treatment    SURGICAL HISTORY: Past Surgical History:  Procedure  Laterality Date  . CATARACT EXTRACTION W/ INTRAOCULAR LENS  IMPLANT, BILATERAL  2005 approx  . CYSTOSCOPY W/ URETERAL STENT PLACEMENT Right 12/03/2015   Procedure: CYSTOSCOPY WITH RETROGRADE PYELOGRAM/URETERAL STENT PLACEMENT;  Surgeon: Cleon Gustin, MD;  Location: [facility];  Service: Urology;  Laterality: Right;  . CYSTOSCOPY W/ URETERAL STENT PLACEMENT Right 02/15/2016   Procedure: CYSTOSCOPY WITH RETROGRADE PYELOGRAM/URETERAL STENT REPLACEMENT;  Surgeon: Cleon Gustin, MD;  Location: [facility];  Service: Urology;  Laterality: Right;  . CYSTOSCOPY W/ URETERAL STENT PLACEMENT Right 03/28/2016   Procedure: CYSTOSCOPY WITH RETROGRADE PYELOGRAM/URETERAL STENT EXCHANGE;  Surgeon: Cleon Gustin, MD;  Location: [facility];  Service: Urology;  Laterality: Right;  . CYSTOSCOPY W/ URETERAL STENT PLACEMENT Right 07/18/2016   Procedure: CYSTOSCOPY WITH RETROGRADE PYELOGRAM/ STENT EXCHANGE;  Surgeon: Cleon Gustin, MD;  [facility]  Service: Urology;  Laterality: Right;  . CYSTOSCOPY W/ URETERAL STENT PLACEMENT Right 10/10/2016   Procedure: CYSTOSCOPY WITH RETROGRADE PYELOGRAM/URETERAL STENT REPLACEMENT;  Surgeon: Cleon Gustin, MD;  Location: [facility];  Service: Urology;  Laterality: Right;  . CYSTOSCOPY W/ URETERAL STENT PLACEMENT Right 04/10/2017   Procedure: CYSTOSCOPY WITH RETROGRADE PYELOGRAM/URETERAL STENT EXCHANGE;  Surgeon: Cleon Gustin, MD;  Location: [facility];  Service: Urology;  Laterality: Right;  . CYSTOSCOPY WITH RETROGRADE PYELOGRAM, URETEROSCOPY AND STENT PLACEMENT Right 09/21/2015   Procedure: CYSTOSCOPY WITH BIL RETROGRADE PYELOGRAM, URETEROSCOPY, RIGHT STENT PLACEMENT ;  Surgeon: Cleon Gustin, MD;  Location: [facility];  Service: Urology;  Laterality: Right;  . HEMICOLECTOMY Right 01/23/2015   Dr. Hassell Done  . IR FLUORO GUIDE PORT  INSERTION RIGHT  03/24/2017  . IR US GUIDE VASC ACCESS RIGHT  03/24/2017  . LAPAROSCOPY N/A 01/23/2015   Procedure: DIAGNOSTIC LAPAROSCOPY, EXPLORATORY LAPAROTOMY  RIGHT HEMI-COLECTOMY FOR OBSTRUCTION OF RIGHT TERMINAL ILEUM;  Surgeon: [name], MD;  Location: [facility];  Service: General;  Laterality: N/A;    SOCIAL HISTORY: Social History   Socioeconomic History  . Marital status: Single    Spouse name: Not on file  . Number of children: Not on file  . Years of education: Not on file  . Highest education level: Not on file  Occupational History  . Not on file  Social Needs  . Financial resource strain: Not on file  . Food insecurity:    Worry: Not on file    Inability: Not on file  . Transportation needs:    Medical: Not on file    Non-medical: Not on file  Tobacco Use  . Smoking status: Former Smoker    Packs/day: 0.25    Years: 50.00    Pack years: 12.50    Types: Cigarettes    Last attempt to quit: 01/25/2015    Years since quitting: 2.5  . Smokeless tobacco: Never Used  Substance and Sexual Activity  . Alcohol use: Yes    Alcohol/week: 1.8 oz    Types: 2 Glasses of wine, 1 Cans of beer per week    Comment: occasional   . Drug use: No  . Sexual activity: Not on file  Lifestyle  . Physical activity:    Days per week: Not on file    Minutes per session: Not on file  . Stress: Not on file  Relationships  . Social connections:    Talks on phone: Not on file    Gets together: Not on file    Attends religious service: Not on file    Active member of club or organization: Not on file    Attends meetings of clubs or organizations: Not on file    Relationship status: Not on file  . Intimate partner violence:    Fear of current or ex partner: Not on file    Emotionally abused: Not on file    Physically abused: Not on file    Forced sexual activity: Not on file  Other Topics Concern  . Not on file  Social History Narrative   Legally separated   Lives  w/daughter, Jory Welke   Has total of #2 daughters and #1 son  Dementia   Fall Risk-ambulates w/cane    FAMILY HISTORY: Family History  Problem Relation Age of Onset  . Diabetes Mellitus II Father   . Cancer Mother 61       ovarian cancer   . Cervical cancer Other     ALLERGIES:  is allergic to namenda [memantine hcl] and oxycodone.  MEDICATIONS:  Current Outpatient Medications  Medication Sig Dispense Refill  . amLODipine (NORVASC) 5 MG tablet Take 5 mg by mouth every morning.     Marland Kitchen aspirin 81 MG tablet Take 81 mg by mouth daily.    . Cholecalciferol (VITAMIN D3) 2000 UNITS TABS Take by mouth daily.    . Coenzyme Q10 (CO Q 10) 100 MG CAPS Take by mouth daily.    Marland Kitchen lidocaine-prilocaine (EMLA) cream Apply 1 application topically as needed. 30 g 5  . Melatonin 5 MG TABS Take 5-15 mg by mouth at bedtime as needed.    . Meth-Hyo-M Bl-Na Phos-Ph Sal (URIBEL) 118 MG CAPS Take 1 capsule (118 mg total) by mouth 2 (two) times daily as needed. 30 capsule 3  . mirabegron ER (MYRBETRIQ) 50 MG TB24 tablet Take 1 tablet (50 mg total) by mouth daily. 30 tablet 11  . mirtazapine (REMERON) 15 MG tablet TAKE 1 TABLET BY MOUTH AT BEDTIME 30 tablet 3  . Multiple Vitamins-Minerals (WOMENS MULTIVITAMIN PO) Take 1 tablet by mouth daily. With iron    . polyethylene glycol (MIRALAX / GLYCOLAX) packet Take 17 g by mouth daily. (Patient taking differently: Take 17 g by mouth daily as needed. ) 14 each 0  . prochlorperazine (COMPAZINE) 5 MG tablet Take 1 tablet (5 mg total) by mouth every 6 (six) hours as needed for nausea or vomiting. 30 tablet 0  . QUEtiapine (SEROQUEL) 25 MG tablet Take 1 tablet (25 mg total) by mouth at bedtime. Take 1/2 tablet at night for 1 week, then increase to 1 tablet at night 30 tablet 6  . traMADol (ULTRAM) 50 MG tablet Take 1 tablet (50 mg total) by mouth every 6 (six) hours as needed. 30 tablet 0  . UNABLE TO FIND Med Name: Closys Mouth Rinse two-three times daily for  ulcer.    . vitamin B-12 (CYANOCOBALAMIN) 1000 MCG tablet Take 1,000 mcg by mouth daily.     Current Facility-Administered Medications  Medication Dose Route Frequency Provider Last Rate Last Dose  . sodium chloride flush (NS) 0.9 % injection 10 mL  10 mL Intravenous PRN [name], MD       Facility-Administered Medications Ordered in Other Visits  Medication Dose Route Frequency Provider Last Rate Last Dose  . heparin lock flush 100 unit/mL  500 Units Intravenous Once [name], MD        REVIEW OF SYSTEMS:  Constitutional: Denies fevers, chills or abnormal night sweats Eyes: Denies blurriness of vision, double vision or watery eyes  Ears, nose, mouth, throat, and face: Denies mucositis or sore throat  Respiratory: Denies cough, dyspnea or wheezes Cardiovascular: Denies palpitation, chest discomfort or lower extremity swelling Gastrointestinal:  Denies heartburn   Urinary:  Denies Dysuria  Skin: Denies abnormal skin rashes (+) Slight hyperpigmentation on lateral upper right thigh Lymphatics: Denies new lymphadenopathy or easy bruising Neurological:Denies numbness, tingling or new weaknesses Behavioral/Psych: Mood is stable, no new changes (+) Dementia All other systems were reviewed with the patient and are negative.  PHYSICAL EXAMINATION:  ECOG PERFORMANCE STATUS: 1 - Symptomatic but completely ambulatory  Vitals:   08/04/17 1045  BP: Marland Kitchen)  141/89  Pulse: 84  Resp: 18  Temp: 98.2 F (36.8 C)  SpO2: 100%   Filed Weights   08/04/17 1045  Weight: 113 lb 6.4 oz (51.4 kg)     GENERAL:alert, no distress and comfortable SKIN: skin color, texture, turgor are normal, no rashes or significant lesions EYES: normal, conjunctiva are pink and non-injected, sclera clear (+) difficult to evaluate due to her dementia OROPHARYNX:no exudate (+) Upper and lower dentures  (+) slight color change of tongue  NECK: supple, thyroid normal size, non-tender, without nodularity LYMPH:  no  palpable lymphadenopathy in the cervical, axillary or inguinal LUNGS: clear to auscultation and percussion with normal breathing effort HEART: regular rate & rhythm and no murmurs and no lower extremity edema ABDOMEN:abdomen soft, non-tender and normal bowel sounds  Musculoskeletal:no cyanosis of digits and no clubbing  PSYCH: alert and with fluent speech, (+) dementia  NEURO: no focal motor/sensory deficits  LABORATORY DATA:  I have reviewed the data as listed CBC Latest Ref Rng & Units 08/04/2017 07/21/2017 07/07/2017  WBC 3.9 - 10.3 K/uL 5.8 7.2 5.5  Hemoglobin 11.6 - 15.9 g/dL 10.9(L) 11.2(L) 11.1(L)  Hematocrit 34.8 - 46.6 % 32.6(L) 33.8(L) 34.6(L)  Platelets 145 - 400 K/uL 382 320 324    CMP Latest Ref Rng & Units 08/04/2017 07/21/2017 07/07/2017  Glucose 70 - 140 mg/dL 92 92 134  BUN 7 - 26 mg/dL _0 Creatinine 0.60 - 1.10 mg/dL 1.21(H) 1.36(H) 1.45(H)  Sodium 136 - 145 mmol/L 139 138 141  Potassium 3.5 - 5.1 mmol/L 3.9 4.1 4.1  Chloride 98 - 109 mmol/L 108 106 109  CO2 22 - 29 mmol/L _1 Calcium 8.4 - 10.4 mg/dL 8.8 8.7 9.2  Total Protein 6.4 - 8.3 g/dL 6.9 7.1 6.9  Total Bilirubin 0.2 - 1.2 mg/dL 0.3 0.3 0.4  Alkaline Phos 40 - 150 U/L 68 85 76  AST 5 - 34 U/L _2 ALT 0 - 55 U/L 8 11 <6    CEA   01/23/2015: 5.7 06/05/2015: 6.1 08/04/2015: 10.3 10/12/2015: 13.29 (in-house)  11/04/2015: 12.83 01/13/2016: 8.57 02/24/2016: 8.41 04/27/2016: 9.22 05/20/2016: 8.32 06/17/2016: 6.45 07/15/2016: 5.07 07/29/2016: 6.51 09/09/16: 7.47 10/07/16: 7.85 11/04/16: 7.75 12/02/16: 8.13 01/06/17: 10.36 02/03/17: 12.60 03/03/18: 9.96  03/31/17: 12.7 04/14/17: 11.93 05/12/17: 13.00 06/09/17: 14.97 07/07/17: 15.22 08/04/17: PENDING    PATHOLOGY REPORT: Diagnosis 01/23/2015 Colon, segmental resection, with terminal ileum and cecum - INVASIVE ADENOCARCINOMA EXTENDING INTO PERICECAL CONNECTIVE TISSUE AND INVOLVING SMALL INTESTINE. - THREE BENIGN LYMPH NODES (0/3). - SEE ONCOLOGY  TABLE BELOW.  Microscopic Comment COLON AND RECTUM (INCLUDING TRANS-ANAL RESECTION): Specimen: Terminal ileum and cecum. Procedure: Resection. Tumor site: Cecum adjacent to appendix. Specimen integrity: Intact. Macroscopic intactness of mesorectum: Not applicable. Macroscopic tumor perforation: No. Invasive tumor: Maximum size: 4.0 cm. Histologic type(s): Colorectal adenocarcinoma. Histologic grade and differentiation: G2: moderately differentiated/low grade. Type of polyp in which invasive carcinoma arose: No residual polyp. Microscopic extension of invasive tumor: Into pericecal connective tissue and terminal ileum. Lymph-Vascular invasion: Not identified. Peri-neural invasion: Not identified. Tumor deposit(s) (discontinuous extramural extension): No. Resection margins: Proximal margin: Free of tumor. Distal margin: Free of tumor. Circumferential (radial) (posterior ascending, posterior descending; lateral and posterior mid-rectum; and entire lower 1/3 rectum): Free of tumor. Mesenteric margin (sigmoid and transverse): N/A. Distance closest margin (if all above margins negative): N/A. Treatment effect (neo-adjuvant therapy): No. Additional polyp(s): No. Non-neoplastic findings: N/A. Lymph nodes: number examined 3; number positive: 0. Pathologic Staging:  pT4b, pN0, pMX. Ancillary studies: Mismatch repair protein by immunohistochemistry pending. Comments: There is a colorectal-type adenocarcinoma arising in the cecum in the area of the appendiceal orifice. The tumor extends into the pericecal connective tissue, and involves the attached segment of terminal ileum. Immunohistochemistry shows the tumor is positive with CDX2 and cytokeratin 20, and negative with cytokeratin 7, estrogen receptor and WT-1 supporting a diagnosis of primary colorectal adenocarcinoma. ADDITIONAL INFORMATION: Mismatch Repair (MMR) Protein Immunohistochemistry (IHC) IHC Expression Result: MLH1: Preserved  nuclear expression (greater 50% tumor expression) MSH2: Preserved nuclear expression (greater 50% tumor expression) MSH6: Preserved nuclear expression (greater 50% tumor expression) PMS2: Preserved nuclear expression (greater 50% tumor expression) * Internal control demonstrates intact nuclear expression Interpretation: NORMAL There is preserved expression of the major and minor MMR proteins. There is a very low probability that microsatellite instability (MSI) is present. However, certain clinically significant MMR protein mutations may result in preservation of nuclear expression. It is recommended that the preservation of protein expression be correlated with molecular based MSI testing.    RADIOGRAPHIC STUDIES: I have personally reviewed the radiological images as listed and agreed with the findings in the report.  PET, 07/03/2017 IMPRESSION: 1. General mild worsening, with increased activity in the hepatic metastatic lesions, mesenteric and omental nodularity, and with new/worsening metastatic nodularity in the right lower quadrant below the cecum. 2. There are over 20 small pulmonary nodules which are below sensitive PET-CT size thresholds and stable from the prior exam, but not currently hypermetabolic. 3. Other imaging findings of potential clinical significance: Aortic Atherosclerosis (ICD10-I70.0) and Emphysema (ICD10-J43.9). Coronary atherosclerosis. Uterine fibroid. Small amount of free pelvic fluid in the cul-de-sac. Old pelvic deformities from prior fractures. Degenerative hip and glenohumeral arthropathy. Spondylosis.  PET 03/13/17 IMPRESSION: 1. Hepatic metastases are stable in size with stable to minimally decreased hypermetabolism. 2. Inferior left rectus abdominus metastasis shows mildly decreased hypermetabolism. 3. Omental nodule no longer shows abnormal hypermetabolism. 4. Scattered pulmonary nodules are stable in size and too small for PET resolution. 5.   Aortic atherosclerosis (ICD10-170.0). 6.  Emphysema (ICD10-J43.9).  PET 11/25/16 IMPRESSION: 1. Interval worsening in the interval. Numerous pulmonary nodules are again seen. While multiple nodules are stable, there are several nodules which are a little larger in the interval. They remain too small to completely characterize with PET-CT. The 2 hepatic metastases demonstrate increased FDG uptake on today's study compared to the previous study. Abdominal wall and peritoneal metastases are mildly worsened.  PET 08/09/2016 IMPRESSION: 1. Overall mild improvement compared to FDG PET scan of 10/28/2015. 2. Pulmonary nodules are increased in size from PET-CT scan and similar size to most recent CT scan. Largest lesion RIGHT lower lobe does have associated faint metabolic activity consistent with pulmonary metastasis. 3. Decrease in metabolic activity of RIGHT hepatic lobe lesion. Potential second lesion LEFT hepatic lobe with slightly lower metabolic activity. 4. Decreased metabolic activity at the enteric colonic anastomosis in the RIGHT lower quadrant. 5. Interval decrease in metabolic activity of peritoneal nodular metastasis. 6. Nodular implant along ventral abdominal wall may be within the musculature, but also improved.  CT CAP w Contrast 05/19/2016 IMPRESSION: 1. Numerous small solid pulmonary nodules, one of which is new, and several of which have mildly increased in size since 10/28/2015, worrisome for mild progression of pulmonary metastases. 2. Solitary right liver lobe metastasis is mildly increased in sizesince 01/08/2016. 3. Peritoneal metastases have mildly increased in size since 01/08/2016, largest in the right pelvic sidewall. 4. Well-positioned right nephroureteral stent without overt  right hydronephrosis. 5. Additional findings include aortic atherosclerosis, 1 vessel coronary atherosclerosis and mild to moderate emphysema.  Ct chest, Abdomen Pelvis Wo Contrast 01/08/2016    IMPRESSION: 1. 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 2. Hepatic and renal cysts. 3.  Aortoiliac atherosclerotic vascular disease. 4. Lumbar spondylosis and degenerative disc disease with multilevel impingement.  ASSESSMENT & PLAN: 77 y.o. African-American female, with past medical history of moderate dementia, independent with ADLs, hypertension, presents with small bowel obstruction and weight loss.  1. 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 -I personally reviewed her recent PET scan images, unfortunately the scan showed hypermetabolic peritoneal and liver metastases. 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 The tumor has MSI stable, she is not a candidate for immunotherapy, although she may be a candidate for clinical trial of immunotherapy plus MEK inhibitor at [facility]. -She started Xeloda 1532m bid, 2 weeks on and one week off on 11/11/15 and stopped 05/20/16 due to disease progression.  -I previously reviewed her restaging CT scan from 01/08/2016, which showed overall stable disease, no new lesions. We'll continue her chemotherapy. -I previously reviewed the CT from 05/19/2016 with the patient and her daughter in detail. She has had a moderate disease progression in lungs, liver and peritoneum. I  recommended her to change treatment. -She started second line single agent irinotecan on 06/03/16 at a reduced dose 1536mm2, tolerated well. -The goal of therapy is palliative, mainly to improve her quality of life. - PET Scan image from 08/09/16 showed good partial response to treatment. We will continue irinotecan and Avastin. -We previously discussed pt being able to take chemo break and she will notify usKoreaf needed  -I will hold Avastin if her urine protein 300 or above  -We discussed her 11/25/16 PET scan. She has small increase in size of some of her small lung nodules, and mild increased FDG of her liver mets, but overall stable disease. I think the benefit of current treatment is getting smaller. We may need to change treatment in 2 months after a repeat scan. Will continue Irinotecan and avastin for now. She agrees.  -We will consider changing her treatment to 5-fu weekly in the future if she has disease progresses -I have low threshold to stop her chemotherapy due to her comorbidities, especially dementia, if she develops more symptoms or intolerance due to chemotherapy treatment. -The daughter and I previously discussed trying to get advanced home care to help pt and what type of help she is seeking. I also mentioned the options of physical therapy to help with her fatigue.  -I previously discussed she is tolerating treatment well and can continue for as long as she can tolerate. Her daughter and family agree. -PET scan on 03/13/17 revealed that hepatic metastases are stable in size with stable to minimally decreased hypermetabolism, the inferior left rectus abdominus metastasis shows  mildly decreased hypermetabolism, the omental nodule no longer shows abnormal hypermetabolism. Scattered pulmonary nodules are stable in size and too small for PET resolution. 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 Previously discussed with pt and daughter. They are very pleased. -Her urine proteins were  previously elevated at above 300 and Avastin has been held intermittently since 03/17/17 if urine protein >=300.  -labs reviewed, adequate proceed with Irinotecan and Avastin held today due to proteinuria. Will check at next visit lab.  -PET Scan 07/03/2017 reviewed mild disease progression, with mainly increased FDG uptake, the overall metastatic lesion sizes have not changed significantly.  -We previously discussed the option of stop chemo, or continue irinotecan for another 2 to 3 months and restaging.  I think the effectiveness of irinotecan  has decreased.  Given her excellent tolerance, clinically asymptomatic, we decided to continue chemo for now.  -If she has further disease progression, I will stop chemo treatment. I do not recommend saline chemo, due to the low benefit and her medical conditions especially dementia. -We also discussed if she experience more side effects from chemo, or becomes more symptomatic from cancer, will stop chemo. -Labs reviewed, She has become anemic again. I suggest she restart oral iron given by Dr. Barns and do not stop. Her total urine protein is 300, I will hold Avastin today. Overall labs adequate to proceed with Irinotecan.  -F/u in 2 weeks   -plan to repeat staging scan in early August   2. CKD stage III, right hydroureter  -Her Cr was around 1 in 08/2013, Cr increased to 2.4 on 01/20/2015 when she was diagnosed with colon cancer due to dehydration, improved some and moved around.3-1.5 .  -A previous CT scan showed right hydroureter, which probably contributes to her renal insufficiency. -she had ureter stent placement again in 12/2015, 03/2016, 04/2016, and 04/2017 -Cr is overall stable    3. HTN, dementia   -She will continue her medication and follow-up with her primary care physician. -Her BP has been better controlled lately, we previously discussed that Avastin can cause hypertension, we'll continue monitoring closely. -Her dementia has got worse  gradually, which has increased her care burden on her daughter. I previously discussed burn-out prevention with her daughter, she appreciated and has planned to have a care-giver for her mother  -Dementia better on Seroquel    4. Nausea and diarrhea -Secondary to chemotherapy, overall very mild and manageable  -She is to take Imodium PRN and her nausea medications. -Nausea has improved and diarrhea has resolved.   5. Goal of care discussion  -We previously discussed the incurable nature of her cancer, and the overall poor prognosis, especially if she has progress on chemo -The patient understands the goal of care is palliative. -Due to her advanced age and comorbidities, especially dementia, I have low threshold to switch her to palliative care alone if she does not tolerate chemo well or dementia gets worse, her daughter is on -board  -I recommend DNR/DNI, pt and her daughter agrees.  -We reviewed what help she could get in home aid service or hospice. They plan to continue with chemotherapy as long as she can tolerate it. I will contact home care service and see if her insurance will cover it.  -Based on consult with social worker, she will wait on searching for further help  6. Insomnia  -I first suggested melatonin over-the counter and to check with her neurologist.  -If that does not help she can use prescription medication.  -  Suggested she increase benadryl to 1.5-2 tabs as needed. She will try white noise machine for non-pharm intervention. -Much better sleeping on Seroquel    7. Social support -Due to her dementia and chemotherapy, she requires 24/7 care at home.  Her daughter is her only caregiver. -I will refer her to advanced home care for nursing, physical therapy, occupational therapy and home aids for bathing etc, referral was made today  -Social worker referral to discuss additional social services for pt  -She has a caregiver now that is coming by 2 times a week, daughter  is planning to increase to 3-4 days a week  -She plans to start with advanced homecare on 08/07/17,. She will continue with caregiver.   Plan -Labs reviewed and adequate to proceed with Irinotecan, hold avastin today due to proteinuria  -lab, flush, F/u and irinotecan/avastin in 2 weeks   All questions were answered. The patient knows to call the clinic with any problems, questions or concerns.  I spent 20 minutes counseling the patient face to face. The total time spent in the appointment was 25 minutes and more than 50% was on counseling.  Oneal Deputy, am acting as scribe for [name], MD.   I have reviewed the above documentation for accuracy and completeness, and I agree with the above.    [name]  08/04/2017 11:30 AM

## 2017-08-03 NOTE — Telephone Encounter (Signed)
Received call from [place] @ [facility] requesting verbal order for additional services for home health.  Per Glenard Haring, pt needs OT, Speech therapy evaluation, Skill nursing , and Home health aide for assistance with bathing 2 x per week. Angel requested additional services above for 3 weeks.   Tobie Poet verbal order as requested. Angel's    Phone    [phone].

## 2017-08-04 ENCOUNTER — Inpatient Hospital Stay: Payer: Medicare Other

## 2017-08-04 ENCOUNTER — Inpatient Hospital Stay (HOSPITAL_BASED_OUTPATIENT_CLINIC_OR_DEPARTMENT_OTHER): Payer: Medicare Other | Admitting: Hematology

## 2017-08-04 ENCOUNTER — Telehealth: Payer: Self-pay | Admitting: Hematology

## 2017-08-04 VITALS — BP 141/89 | HR 84 | Temp 98.2°F | Resp 18 | Ht 65.0 in | Wt 113.4 lb

## 2017-08-04 DIAGNOSIS — N183 Chronic kidney disease, stage 3 (moderate): Secondary | ICD-10-CM | POA: Diagnosis not present

## 2017-08-04 DIAGNOSIS — E785 Hyperlipidemia, unspecified: Secondary | ICD-10-CM | POA: Diagnosis not present

## 2017-08-04 DIAGNOSIS — I2510 Atherosclerotic heart disease of native coronary artery without angina pectoris: Secondary | ICD-10-CM

## 2017-08-04 DIAGNOSIS — Z7982 Long term (current) use of aspirin: Secondary | ICD-10-CM

## 2017-08-04 DIAGNOSIS — I129 Hypertensive chronic kidney disease with stage 1 through stage 4 chronic kidney disease, or unspecified chronic kidney disease: Secondary | ICD-10-CM

## 2017-08-04 DIAGNOSIS — Z79899 Other long term (current) drug therapy: Secondary | ICD-10-CM

## 2017-08-04 DIAGNOSIS — C182 Malignant neoplasm of ascending colon: Secondary | ICD-10-CM | POA: Diagnosis not present

## 2017-08-04 DIAGNOSIS — C787 Secondary malignant neoplasm of liver and intrahepatic bile duct: Secondary | ICD-10-CM | POA: Diagnosis not present

## 2017-08-04 DIAGNOSIS — F0390 Unspecified dementia without behavioral disturbance: Secondary | ICD-10-CM | POA: Diagnosis not present

## 2017-08-04 DIAGNOSIS — D638 Anemia in other chronic diseases classified elsewhere: Secondary | ICD-10-CM

## 2017-08-04 DIAGNOSIS — D259 Leiomyoma of uterus, unspecified: Secondary | ICD-10-CM | POA: Diagnosis not present

## 2017-08-04 DIAGNOSIS — Z5111 Encounter for antineoplastic chemotherapy: Secondary | ICD-10-CM | POA: Diagnosis not present

## 2017-08-04 DIAGNOSIS — J432 Centrilobular emphysema: Secondary | ICD-10-CM | POA: Diagnosis not present

## 2017-08-04 DIAGNOSIS — Z8041 Family history of malignant neoplasm of ovary: Secondary | ICD-10-CM

## 2017-08-04 DIAGNOSIS — I10 Essential (primary) hypertension: Secondary | ICD-10-CM

## 2017-08-04 DIAGNOSIS — I700 Atherosclerosis of aorta: Secondary | ICD-10-CM | POA: Diagnosis not present

## 2017-08-04 LAB — COMPREHENSIVE METABOLIC PANEL
ALT: 8 U/L (ref 0–55)
ANION GAP: 8 (ref 3–11)
AST: 16 U/L (ref 5–34)
Albumin: 3.4 g/dL — ABNORMAL LOW (ref 3.5–5.0)
Alkaline Phosphatase: 68 U/L (ref 40–150)
BUN: 14 mg/dL (ref 7–26)
CHLORIDE: 108 mmol/L (ref 98–109)
CO2: 23 mmol/L (ref 22–29)
CREATININE: 1.21 mg/dL — AB (ref 0.60–1.10)
Calcium: 8.8 mg/dL (ref 8.4–10.4)
GFR, EST AFRICAN AMERICAN: 49 mL/min — AB (ref >60)
GFR, EST NON AFRICAN AMERICAN: 43 mL/min — AB (ref >60)
Glucose, Bld: 92 mg/dL (ref 70–140)
POTASSIUM: 3.9 mmol/L (ref 3.5–5.1)
Sodium: 139 mmol/L (ref 136–145)
Total Bilirubin: 0.3 mg/dL (ref 0.2–1.2)
Total Protein: 6.9 g/dL (ref 6.4–8.3)

## 2017-08-04 LAB — TOTAL PROTEIN, URINE DIPSTICK: PROTEIN: 300 mg/dL — AB

## 2017-08-04 LAB — CBC WITH DIFFERENTIAL/PLATELET
Basophils Absolute: 0.1 10*3/uL (ref 0.0–0.1)
Basophils Relative: 1 %
Eosinophils Absolute: 0.2 10*3/uL (ref 0.0–0.5)
Eosinophils Relative: 3 %
HCT: 32.6 % — ABNORMAL LOW (ref 34.8–46.6)
HEMOGLOBIN: 10.9 g/dL — AB (ref 11.6–15.9)
LYMPHS ABS: 0.8 10*3/uL — AB (ref 0.9–3.3)
Lymphocytes Relative: 14 %
MCH: 29.7 pg (ref 25.1–34.0)
MCHC: 33.4 g/dL (ref 31.5–36.0)
MCV: 89 fL (ref 79.5–101.0)
Monocytes Absolute: 0.6 10*3/uL (ref 0.1–0.9)
Monocytes Relative: 10 %
NEUTROS ABS: 4.2 10*3/uL (ref 1.5–6.5)
NEUTROS PCT: 72 %
Platelets: 382 10*3/uL (ref 145–400)
RBC: 3.66 MIL/uL — AB (ref 3.70–5.45)
RDW: 18.5 % — ABNORMAL HIGH (ref 11.2–14.5)
WBC: 5.8 10*3/uL (ref 3.9–10.3)

## 2017-08-04 LAB — CEA (IN HOUSE-CHCC): CEA (CHCC-IN HOUSE): 17.26 ng/mL — AB (ref 0.00–5.00)

## 2017-08-04 MED ORDER — DEXTROSE 5 % IV SOLN
150.0000 mg/m2 | Freq: Once | INTRAVENOUS | Status: AC
  Administered 2017-08-04: 240 mg via INTRAVENOUS
  Filled 2017-08-04: qty 12

## 2017-08-04 MED ORDER — SODIUM CHLORIDE 0.9% FLUSH
10.0000 mL | INTRAVENOUS | Status: DC | PRN
  Filled 2017-08-04: qty 10

## 2017-08-04 MED ORDER — SODIUM CHLORIDE 0.9 % IV SOLN
Freq: Once | INTRAVENOUS | Status: AC
  Administered 2017-08-04: 13:00:00 via INTRAVENOUS

## 2017-08-04 MED ORDER — ATROPINE SULFATE 1 MG/ML IJ SOLN
0.5000 mg | Freq: Once | INTRAMUSCULAR | Status: AC | PRN
  Administered 2017-08-04: 0.5 mg via INTRAVENOUS

## 2017-08-04 MED ORDER — DEXAMETHASONE SODIUM PHOSPHATE 10 MG/ML IJ SOLN
INTRAMUSCULAR | Status: AC
  Filled 2017-08-04: qty 1

## 2017-08-04 MED ORDER — HEPARIN SOD (PORK) LOCK FLUSH 100 UNIT/ML IV SOLN
500.0000 [IU] | Freq: Once | INTRAVENOUS | Status: DC | PRN
  Filled 2017-08-04: qty 5

## 2017-08-04 MED ORDER — PALONOSETRON HCL INJECTION 0.25 MG/5ML
INTRAVENOUS | Status: AC
  Filled 2017-08-04: qty 5

## 2017-08-04 MED ORDER — PALONOSETRON HCL INJECTION 0.25 MG/5ML
0.2500 mg | Freq: Once | INTRAVENOUS | Status: AC
  Administered 2017-08-04: 0.25 mg via INTRAVENOUS

## 2017-08-04 MED ORDER — DEXAMETHASONE SODIUM PHOSPHATE 10 MG/ML IJ SOLN
10.0000 mg | Freq: Once | INTRAMUSCULAR | Status: AC
  Administered 2017-08-04: 10 mg via INTRAVENOUS

## 2017-08-04 MED ORDER — ATROPINE SULFATE 1 MG/ML IJ SOLN
INTRAMUSCULAR | Status: AC
  Filled 2017-08-04: qty 1

## 2017-08-04 NOTE — Patient Instructions (Signed)
[facility] Discharge Instructions for Patients Receiving Chemotherapy  Today you received the following chemotherapy agents Irinotecan, Avastin  To help prevent nausea and vomiting after your treatment, we encourage you to take your nausea medication as directed   If you develop nausea and vomiting that is not controlled by your nausea medication, call the clinic.   BELOW ARE SYMPTOMS THAT SHOULD BE REPORTED IMMEDIATELY:  *FEVER GREATER THAN 100.5 F  *CHILLS WITH OR WITHOUT FEVER  NAUSEA AND VOMITING THAT IS NOT CONTROLLED WITH YOUR NAUSEA MEDICATION  *UNUSUAL SHORTNESS OF BREATH  *UNUSUAL BRUISING OR BLEEDING  TENDERNESS IN MOUTH AND THROAT WITH OR WITHOUT PRESENCE OF ULCERS  *URINARY PROBLEMS  *BOWEL PROBLEMS  UNUSUAL RASH Items with * indicate a potential emergency and should be followed up as soon as possible.  Feel free to call the clinic should you have any questions or concerns. The clinic phone number is [phone].  Please show the CHEMO ALERT CARD at check-in to the Emergency Department and triage nurse.   

## 2017-08-04 NOTE — Telephone Encounter (Signed)
No los 5/31

## 2017-08-05 ENCOUNTER — Encounter: Payer: Self-pay | Admitting: Hematology

## 2017-08-07 ENCOUNTER — Other Ambulatory Visit: Payer: Self-pay | Admitting: Urology

## 2017-08-16 ENCOUNTER — Other Ambulatory Visit: Payer: Self-pay | Admitting: Neurology

## 2017-08-16 MED FILL — QUETIAPINE 25 MG TABLET: 25 | 30 days supply | Qty: 30 | Fill #0

## 2017-08-16 MED FILL — MIRTAZAPINE 15 MG TABLET: 15 | 30 days supply | Qty: 30 | Fill #3

## 2017-08-17 MED FILL — MYRBETRIQ ER 50 MG TABLET: 50 | 30 days supply | Qty: 30 | Fill #4

## 2017-08-18 ENCOUNTER — Inpatient Hospital Stay: Payer: Medicare Other

## 2017-08-18 ENCOUNTER — Inpatient Hospital Stay (HOSPITAL_BASED_OUTPATIENT_CLINIC_OR_DEPARTMENT_OTHER): Payer: Medicare Other | Admitting: Nurse Practitioner

## 2017-08-18 ENCOUNTER — Inpatient Hospital Stay: Payer: Medicare Other | Attending: Hematology

## 2017-08-18 ENCOUNTER — Telehealth: Payer: Self-pay

## 2017-08-18 ENCOUNTER — Encounter: Payer: Self-pay | Admitting: Nurse Practitioner

## 2017-08-18 VITALS — BP 139/79 | HR 97 | Temp 98.0°F | Resp 18 | Ht 65.0 in | Wt 113.9 lb

## 2017-08-18 VITALS — BP 132/86 | HR 90 | Resp 17

## 2017-08-18 DIAGNOSIS — C787 Secondary malignant neoplasm of liver and intrahepatic bile duct: Secondary | ICD-10-CM | POA: Diagnosis not present

## 2017-08-18 DIAGNOSIS — Z9049 Acquired absence of other specified parts of digestive tract: Secondary | ICD-10-CM | POA: Diagnosis not present

## 2017-08-18 DIAGNOSIS — I10 Essential (primary) hypertension: Secondary | ICD-10-CM

## 2017-08-18 DIAGNOSIS — R918 Other nonspecific abnormal finding of lung field: Secondary | ICD-10-CM | POA: Insufficient documentation

## 2017-08-18 DIAGNOSIS — K521 Toxic gastroenteritis and colitis: Secondary | ICD-10-CM | POA: Diagnosis not present

## 2017-08-18 DIAGNOSIS — F0390 Unspecified dementia without behavioral disturbance: Secondary | ICD-10-CM | POA: Insufficient documentation

## 2017-08-18 DIAGNOSIS — C786 Secondary malignant neoplasm of retroperitoneum and peritoneum: Secondary | ICD-10-CM | POA: Diagnosis not present

## 2017-08-18 DIAGNOSIS — Z5111 Encounter for antineoplastic chemotherapy: Secondary | ICD-10-CM

## 2017-08-18 DIAGNOSIS — N183 Chronic kidney disease, stage 3 (moderate): Secondary | ICD-10-CM | POA: Insufficient documentation

## 2017-08-18 DIAGNOSIS — Z9221 Personal history of antineoplastic chemotherapy: Secondary | ICD-10-CM | POA: Diagnosis not present

## 2017-08-18 DIAGNOSIS — G4700 Insomnia, unspecified: Secondary | ICD-10-CM | POA: Diagnosis not present

## 2017-08-18 DIAGNOSIS — Z5112 Encounter for antineoplastic immunotherapy: Secondary | ICD-10-CM | POA: Diagnosis present

## 2017-08-18 DIAGNOSIS — C182 Malignant neoplasm of ascending colon: Secondary | ICD-10-CM | POA: Insufficient documentation

## 2017-08-18 DIAGNOSIS — N134 Hydroureter: Secondary | ICD-10-CM

## 2017-08-18 LAB — COMPREHENSIVE METABOLIC PANEL
ALT: 10 U/L (ref 0–55)
ANION GAP: 12 — AB (ref 3–11)
AST: 22 U/L (ref 5–34)
Albumin: 3.8 g/dL (ref 3.5–5.0)
Alkaline Phosphatase: 86 U/L (ref 40–150)
BILIRUBIN TOTAL: 0.3 mg/dL (ref 0.2–1.2)
BUN: 13 mg/dL (ref 7–26)
CHLORIDE: 103 mmol/L (ref 98–109)
CO2: 24 mmol/L (ref 22–29)
Calcium: 9.6 mg/dL (ref 8.4–10.4)
Creatinine, Ser: 1.15 mg/dL — ABNORMAL HIGH (ref 0.60–1.10)
GFR calc Af Amer: 53 mL/min — ABNORMAL LOW (ref >60)
GFR, EST NON AFRICAN AMERICAN: 45 mL/min — AB (ref >60)
Glucose, Bld: 76 mg/dL (ref 70–140)
POTASSIUM: 4.2 mmol/L (ref 3.5–5.1)
Sodium: 139 mmol/L (ref 136–145)
Total Protein: 7.6 g/dL (ref 6.4–8.3)

## 2017-08-18 LAB — CBC WITH DIFFERENTIAL/PLATELET
BASOS ABS: 0.1 10*3/uL (ref 0.0–0.1)
Basophils Relative: 1 %
Eosinophils Absolute: 0.2 10*3/uL (ref 0.0–0.5)
Eosinophils Relative: 4 %
HEMATOCRIT: 35 % (ref 34.8–46.6)
HEMOGLOBIN: 11.5 g/dL — AB (ref 11.6–15.9)
LYMPHS PCT: 18 %
Lymphs Abs: 1.1 10*3/uL (ref 0.9–3.3)
MCH: 29.3 pg (ref 25.1–34.0)
MCHC: 32.9 g/dL (ref 31.5–36.0)
MCV: 88.9 fL (ref 79.5–101.0)
Monocytes Absolute: 0.5 10*3/uL (ref 0.1–0.9)
Monocytes Relative: 8 %
NEUTROS ABS: 4 10*3/uL (ref 1.5–6.5)
NEUTROS PCT: 69 %
PLATELETS: 349 10*3/uL (ref 145–400)
RBC: 3.94 MIL/uL (ref 3.70–5.45)
RDW: 18.9 % — ABNORMAL HIGH (ref 11.2–14.5)
WBC: 5.8 10*3/uL (ref 3.9–10.3)

## 2017-08-18 LAB — TOTAL PROTEIN, URINE DIPSTICK: Protein, ur: 100 mg/dL — AB

## 2017-08-18 MED ORDER — PALONOSETRON HCL INJECTION 0.25 MG/5ML
INTRAVENOUS | Status: AC
  Filled 2017-08-18: qty 5

## 2017-08-18 MED ORDER — ATROPINE SULFATE 1 MG/ML IJ SOLN
INTRAMUSCULAR | Status: AC
  Filled 2017-08-18: qty 1

## 2017-08-18 MED ORDER — DEXTROSE 5 % IV SOLN
150.0000 mg/m2 | Freq: Once | INTRAVENOUS | Status: AC
  Administered 2017-08-18: 240 mg via INTRAVENOUS
  Filled 2017-08-18: qty 12

## 2017-08-18 MED ORDER — SODIUM CHLORIDE 0.9 % IV SOLN
5.0000 mg/kg | Freq: Once | INTRAVENOUS | Status: AC
  Administered 2017-08-18: 275 mg via INTRAVENOUS
  Filled 2017-08-18: qty 11

## 2017-08-18 MED ORDER — SODIUM CHLORIDE 0.9% FLUSH
10.0000 mL | INTRAVENOUS | Status: DC | PRN
  Administered 2017-08-18: 10 mL
  Filled 2017-08-18: qty 10

## 2017-08-18 MED ORDER — ATROPINE SULFATE 1 MG/ML IJ SOLN
0.5000 mg | Freq: Once | INTRAMUSCULAR | Status: AC | PRN
  Administered 2017-08-18: 0.5 mg via INTRAVENOUS

## 2017-08-18 MED ORDER — PALONOSETRON HCL INJECTION 0.25 MG/5ML
0.2500 mg | Freq: Once | INTRAVENOUS | Status: AC
  Administered 2017-08-18: 0.25 mg via INTRAVENOUS

## 2017-08-18 MED ORDER — DEXAMETHASONE SODIUM PHOSPHATE 10 MG/ML IJ SOLN
10.0000 mg | Freq: Once | INTRAMUSCULAR | Status: AC
  Administered 2017-08-18: 10 mg via INTRAVENOUS

## 2017-08-18 MED ORDER — HEPARIN SOD (PORK) LOCK FLUSH 100 UNIT/ML IV SOLN
500.0000 [IU] | Freq: Once | INTRAVENOUS | Status: AC | PRN
  Administered 2017-08-18: 500 [IU]
  Filled 2017-08-18: qty 5

## 2017-08-18 MED ORDER — DEXAMETHASONE SODIUM PHOSPHATE 10 MG/ML IJ SOLN
INTRAMUSCULAR | Status: AC
  Filled 2017-08-18: qty 1

## 2017-08-18 MED ORDER — SODIUM CHLORIDE 0.9 % IV SOLN
Freq: Once | INTRAVENOUS | Status: AC
  Administered 2017-08-18: 12:00:00 via INTRAVENOUS

## 2017-08-18 MED FILL — LIDOCAINE-PRILOCAINE CREAM: 2.5-2.5 | 30 days supply | Qty: 30 | Fill #2

## 2017-08-18 NOTE — Progress Notes (Signed)
[place]  [phone] [phone]  Clinic Follow up Note   Patient Care Team: [name], MD as PCP - General (Family Medicine) [name], MD as Consulting Physician (Medical Oncology) [name], MD as Consulting Physician (General Surgery) [name], MD as Consulting Physician (Orthopedic Surgery) [name] [name], MD as Consulting Physician (Urology) 08/18/2017  SUMMARY OF ONCOLOGIC HISTORY: Oncology History   Cancer of right colon [facility])   Staging form: Colon and Rectum, AJCC 7th Edition     Pathologic stage from 01/23/2015: Stage IIC (T4b, N0, cM0) - Signed by [name], MD on 02/05/2015       Cancer of right colon ([place])   01/21/2015 Imaging     CT abdomen and pelvis showed high-grade small bowel obstruction,  right I [place] ureteral nephrosis,  multiple liver cysts.  CT chest was negative for metastatic lesion.      01/23/2015 Pathology Results     invasive adenocarcinoma extending into the pericecal connective tissue,  grade 2, LVI (-),  no perineural invasion, 3 lymph nodes were negative.      01/23/2015 Surgery      terminal ileum and cecum seegmental resection with anastomosis by Dr. Hassell Done      01/23/2015 Initial Diagnosis    Cancer of right colon ([place])      03/13/2015 - 07/06/2015 Chemotherapy    Xeloda 1500 mg bid X 14 days on-7 off, s/p 5 cycles, stopped early due to wrosening renal function      10/28/2015 Progression    PET scan showed multiple hypermetabolic peritoneal nodules, right ovarian cystic mass, and hypermetabolic liver lesion, highly suspicious for metastatic colon cancer. Small pulmonary nodules are indeterminate.       11/11/2015 - 05/20/2016 Chemotherapy    Xeloda 1545m bid, 2 weeks on and one week off, Avastin was added on from cycle 3. Xeloda held from 05/20/16 due to progression on CT from 05/19/16 and total bilirubin increased to 2.6.      01/08/2016 Imaging    Ct chest, Abdomen Pelvis  Wo Contrast 01/08/2016  IMPRESSION: 1. 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 2. Hepatic and renal cysts. 3.  Aortoiliac atherosclerotic vascular disease. 4. Lumbar spondylosis and degenerative disc disease with multilevel Impingement.      05/19/2016 Imaging    CT CAP w Contrast IMPRESSION: 1. Numerous small solid pulmonary nodules, one of which is new, and several of which have mildly increased in size since 10/28/2015, worrisome for mild progression of pulmonary metastases. 2. Solitary right liver lobe metastasis is mildly increased in size since 01/08/2016. 3. Peritoneal metastases have mildly increased in size since 01/08/2016, largest in the right pelvic sidewall. 4. Well-positioned right nephroureteral stent without overt right hydronephrosis. 5. Additional findings include aortic atherosclerosis, 1 vessel coronary atherosclerosis and mild to moderate emphysema.      06/03/2016 -  Chemotherapy    Second-Line Irinotecan and Avastin every 2 weeks starting 06/03/16      07/29/2016 Tumor Marker    CEA 6.51       08/09/2016 Imaging    PET  IMPRESSION: 1. Overall mild improvement compared to FDG PET scan of 10/28/2015. 2. Pulmonary nodules are increased in size from PET-CT scan and similar size to most recent CT scan. Largest lesion RIGHT lower lobe does have associated faint metabolic activity consistent with pulmonary metastasis. 3. Decrease in metabolic activity of RIGHT hepatic lobe lesion. Potential second lesion LEFT hepatic lobe with  slightly lower metabolic activity. 4. Decreased metabolic activity at the enteric colonic anastomosis in the RIGHT lower quadrant. 5. Interval decrease in metabolic activity of peritoneal nodular metastasis. 6. Nodular implant implant along ventral abdominal wall may be within the musculature, but also improved.      09/09/2016 Tumor Marker    CEA: 7.47      10/07/2016  Tumor Marker    CEA: 7.85      11/04/2016 Tumor Marker    CEA: 7.75      11/25/2016 PET scan    PET 11/25/16 IMPRESSION: 1. Interval worsening in the interval. Numerous pulmonary nodules are again seen. While multiple nodules are stable, there are several nodules which are a little larger in the interval. They remain too small to completely characterize with PET-CT. The 2 hepatic metastases demonstrate increased FDG uptake on today's study compared to the previous study. Abdominal wall and peritoneal metastases are mildly worsened.      12/02/2016 Tumor Marker    CEA: 8.13      01/06/2017 Tumor Marker    CEA: 10.36      02/03/2017 Tumor Marker    CEA: 12.60      03/03/2017 Tumor Marker    CEA: 9.96      03/13/2017 PET scan    IMPRESSION: 1. Hepatic metastases are stable in size with stable to minimally decreased hypermetabolism. 2. Inferior left rectus abdominus metastasis shows mildly decreased hypermetabolism. 3. Omental nodule no longer shows abnormal hypermetabolism. 4. Scattered pulmonary nodules are stable in size and too small for PET resolution. 5.  Aortic atherosclerosis (ICD10-170.0). 6.  Emphysema (ICD10-J43.9).      07/03/2017 Imaging    PET, 07/03/2017 IMPRESSION: 1. General mild worsening, with increased activity in the hepatic metastatic lesions, mesenteric and omental nodularity, and with new/worsening metastatic nodularity in the right lower quadrant below the cecum. 2. There are over 20 small pulmonary nodules which are below sensitive PET-CT size thresholds and stable from the prior exam, but not currently hypermetabolic. 3. Other imaging findings of potential clinical significance: Aortic Atherosclerosis (ICD10-I70.0) and Emphysema (ICD10-J43.9). Coronary atherosclerosis. Uterine fibroid. Small amount of free pelvic fluid in the cul-de-sac. Old pelvic deformities from prior fractures. Degenerative hip and glenohumeral arthropathy. Spondylosis.       CURRENT THERAPY: Second-Line Irinotecan and Avastin every 2 weeks started 06/03/16, Avastin held as needed due to proteinuria   INTERVAL HISTORY: Ms. Gunner returns with her daughter for follow up as scheduled. Completed last cycle irinotecan on 08/04/17, avastin was held for proteinuria. She is doing well. Has started using MCT oil in her diet which has improved mental alertness and cooperation. She stays well hydrated. She was evaluated to start PT and speech therapy who comes Tuesday and thursdays. She his mild-moderate fatigue. Good appetite. No n/v/d or pain. Chronic constipation managed with miralax. Denies blood in stool.   REVIEW OF SYSTEMS:   Constitutional: Denies fevers, chills or abnormal weight loss (+) mild - mod fatigue  Eyes: Denies blurriness of vision Ears, nose, mouth, throat, and face: Denies mucositis or sore throat Respiratory: Denies cough, dyspnea or wheezes Cardiovascular: Denies palpitation, chest discomfort or lower extremity swelling Gastrointestinal:  Denies nausea, vomiting, diarrhea, heartburn or change in bowel habits (+) constipation  Skin: Denies abnormal skin rashes Lymphatics: Denies new lymphadenopathy or easy bruising Neurological:Denies numbness, tingling or new weaknesses (+) increased mental alertness (+) more cooperative  Behavioral/Psych: Mood is stable, no new changes  All other systems were reviewed with the patient and  are negative.  MEDICAL HISTORY:  Past Medical History:  Diagnosis Date  . Anxiety   . Aortic atherosclerosis ([place]) 03/15/2017   noted on PET   . Arthritis   . Bilateral renal cysts   . Centrilobular emphysema ([place]) per 05-10-16 chest xray  . CKD (chronic kidney disease), stage III ([place])   . Colon cancer [facility]) oncologist-  dr [name]   dx 01-23-2015  right colon Invasive adenocarcinoma into pericecal connective tissue and involving small intestine , Stage IIc, Grade 2, LVI(-),  (pT4b N0 M0) /  s/p resection termial ileum  and cecum and chemotherapy 01-06-217 to 07-06-2015/   08/ 2017  per PET  recurrent w/ liver mets and peritoneal carcinomatosis  . Complication of anesthesia    can be combative due to dementia  . Full dentures   . Headache    occ stress related  . History of chemotherapy    every other friday currently receiving  . History of gastroesophageal reflux (GERD)    changes diet  . History of pelvic fracture 2015  . Hyperlipidemia   . Hypertension   . Liver metastasis ([place])   . 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 N&V (nausea and vomiting)   . Pulmonary nodules   . Right ovarian cyst   . SBO (small bowel obstruction) ([place]) 01/2015  . 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 UTI (urinary tract infection) 09/26/2016   5 day antibiotic treatment    SURGICAL HISTORY: Past Surgical History:  Procedure Laterality Date  . CATARACT EXTRACTION W/ INTRAOCULAR LENS  IMPLANT, BILATERAL  2005 approx  . CYSTOSCOPY W/ URETERAL STENT PLACEMENT Right 12/03/2015   Procedure: CYSTOSCOPY WITH RETROGRADE PYELOGRAM/URETERAL STENT PLACEMENT;  Surgeon: Cleon Gustin, MD;  Location: [facility];  Service: Urology;  Laterality: Right;  . CYSTOSCOPY W/ URETERAL STENT PLACEMENT Right 02/15/2016   Procedure: CYSTOSCOPY WITH RETROGRADE PYELOGRAM/URETERAL STENT REPLACEMENT;  Surgeon: Cleon Gustin, MD;  Location: [facility];  Service: Urology;  Laterality: Right;  . CYSTOSCOPY W/ URETERAL STENT PLACEMENT Right 03/28/2016   Procedure: CYSTOSCOPY WITH RETROGRADE PYELOGRAM/URETERAL STENT EXCHANGE;  Surgeon: Cleon Gustin, MD;  Location: [facility];  Service: Urology;  Laterality: Right;  . CYSTOSCOPY W/ URETERAL STENT PLACEMENT Right 07/18/2016   Procedure: CYSTOSCOPY WITH  RETROGRADE PYELOGRAM/ STENT EXCHANGE;  Surgeon: Cleon Gustin, MD;  Location: [facility];  Service: Urology;  Laterality: Right;  . CYSTOSCOPY W/ URETERAL STENT PLACEMENT Right 10/10/2016   Procedure: CYSTOSCOPY WITH RETROGRADE PYELOGRAM/URETERAL STENT REPLACEMENT;  Surgeon: Cleon Gustin, MD;  Location: [facility];  Service: Urology;  Laterality: Right;  . CYSTOSCOPY W/ URETERAL STENT PLACEMENT Right 04/10/2017   Procedure: CYSTOSCOPY WITH RETROGRADE PYELOGRAM/URETERAL STENT EXCHANGE;  Surgeon: Cleon Gustin, MD;  Location: [facility];  Service: Urology;  Laterality: Right;  . CYSTOSCOPY WITH RETROGRADE PYELOGRAM, URETEROSCOPY AND STENT PLACEMENT Right 09/21/2015   Procedure: CYSTOSCOPY WITH BIL RETROGRADE PYELOGRAM, URETEROSCOPY, RIGHT STENT PLACEMENT ;  Surgeon: Cleon Gustin, MD;  Location: [facility];  Service: Urology;  Laterality: Right;  . HEMICOLECTOMY Right 01/23/2015   Dr. Hassell Done  . IR FLUORO GUIDE PORT INSERTION RIGHT  03/24/2017  . IR US GUIDE VASC ACCESS RIGHT  03/24/2017  . LAPAROSCOPY N/A 01/23/2015  Procedure: DIAGNOSTIC LAPAROSCOPY, EXPLORATORY LAPAROTOMY  RIGHT HEMI-COLECTOMY FOR OBSTRUCTION OF RIGHT TERMINAL ILEUM;  Surgeon: [name], MD;  Location: [facility];  Service: General;  Laterality: N/A;    I have reviewed the social history and family history with the patient and they are unchanged from previous note.  ALLERGIES:  is allergic to namenda [memantine hcl] and oxycodone.  MEDICATIONS:  Current Outpatient Medications  Medication Sig Dispense Refill  . amLODipine (NORVASC) 5 MG tablet Take 5 mg by mouth every morning.     Marland Kitchen aspirin 81 MG tablet Take 81 mg by mouth daily.    . Cholecalciferol (VITAMIN D3) 2000 UNITS TABS Take by mouth daily.    . Coenzyme Q10 (CO Q 10) 100 MG CAPS Take by mouth daily.    . Melatonin 5 MG TABS Take 5-15 mg by mouth at bedtime as needed.    . Meth-Hyo-M  Bl-Na Phos-Ph Sal (URIBEL) 118 MG CAPS Take 1 capsule (118 mg total) by mouth 2 (two) times daily as needed. 30 capsule 3  . mirabegron ER (MYRBETRIQ) 50 MG TB24 tablet Take 1 tablet (50 mg total) by mouth daily. 30 tablet 11  . mirtazapine (REMERON) 15 MG tablet TAKE 1 TABLET BY MOUTH AT BEDTIME 30 tablet 3  . Multiple Vitamins-Minerals (WOMENS MULTIVITAMIN PO) Take 1 tablet by mouth daily. With iron    . polyethylene glycol (MIRALAX / GLYCOLAX) packet Take 17 g by mouth daily. (Patient taking differently: Take 17 g by mouth daily as needed. ) 14 each 0  . QUEtiapine (SEROQUEL) 25 MG tablet Take 1 tablet each night 30 tablet 6  . vitamin B-12 (CYANOCOBALAMIN) 1000 MCG tablet Take 1,000 mcg by mouth daily.    Marland Kitchen lidocaine-prilocaine (EMLA) cream Apply 1 application topically as needed. 30 g 5  . prochlorperazine (COMPAZINE) 5 MG tablet Take 1 tablet (5 mg total) by mouth every 6 (six) hours as needed for nausea or vomiting. 30 tablet 0  . traMADol (ULTRAM) 50 MG tablet Take 1 tablet (50 mg total) by mouth every 6 (six) hours as needed. 30 tablet 0  . UNABLE TO FIND Med Name: [place] Mouth Rinse two-three times daily for ulcer.     No current facility-administered medications for this visit.    Facility-Administered Medications Ordered in Other Visits  Medication Dose Route Frequency Provider Last Rate Last Dose  . 0.9 %  sodium chloride infusion   Intravenous Once [name], MD      . bevacizumab (AVASTIN) 275 mg in sodium chloride 0.9 % 100 mL chemo infusion  5 mg/kg (Treatment Plan Recorded) Intravenous Once [name], MD      . dexamethasone (DECADRON) injection 10 mg  10 mg Intravenous Once [name], MD      . heparin lock flush 100 unit/mL  500 Units Intravenous Once [name], MD      . irinotecan (CAMPTOSAR) 240 mg in dextrose 5 % 500 mL chemo infusion  150 mg/m2 (Treatment Plan Recorded) Intravenous Once [name], MD      . palonosetron (ALOXI) injection 0.25 mg  0.25 mg Intravenous Once  [name], MD        PHYSICAL EXAMINATION: ECOG PERFORMANCE STATUS: 2 - Symptomatic, <50% confined to bed  Vitals:   08/18/17 1041  BP: 139/79  Pulse: 97  Resp: 18  Temp: 98 F (36.7 C)  SpO2: 100%  Wt: 113.9 lbs   GENERAL:alert, no distress and comfortable SKIN: no rashes or significant lesions EYES: normal, Conjunctiva  are pink and non-injected, sclera clear OROPHARYNX:no thrush or ulcers LYMPH:  no palpable cervical lymphadenopathy LUNGS: clear to auscultation with normal breathing effort HEART: regular rate & rhythm and no murmurs and no lower extremity edema ABDOMEN:abdomen soft, non-tender and normal bowel sounds Musculoskeletal:no cyanosis of digits and no clubbing  NEURO: alert & with fluent speech PAC without erythema   LABORATORY DATA:  I have reviewed the data as listed CBC Latest Ref Rng & Units 08/18/2017 08/04/2017 07/21/2017  WBC 3.9 - 10.3 K/uL 5.8 5.8 7.2  Hemoglobin 11.6 - 15.9 g/dL 11.5(L) 10.9(L) 11.2(L)  Hematocrit 34.8 - 46.6 % 35.0 32.6(L) 33.8(L)  Platelets 145 - 400 K/uL 349 382 320     CMP Latest Ref Rng & Units 08/18/2017 08/04/2017 07/21/2017  Glucose 70 - 140 mg/dL 76 92 92  BUN 7 - 26 mg/dL _0 Creatinine 0.60 - 1.10 mg/dL 1.15(H) 1.21(H) 1.36(H)  Sodium 136 - 145 mmol/L 139 139 138  Potassium 3.5 - 5.1 mmol/L 4.2 3.9 4.1  Chloride 98 - 109 mmol/L 103 108 106  CO2 22 - 29 mmol/L _1 Calcium 8.4 - 10.4 mg/dL 9.6 8.8 8.7  Total Protein 6.4 - 8.3 g/dL 7.6 6.9 7.1  Total Bilirubin 0.2 - 1.2 mg/dL 0.3 0.3 0.3  Alkaline Phos 40 - 150 U/L 86 68 85  AST 5 - 34 U/L _2 ALT 0 - 55 U/L _3 CEA   01/23/2015: 5.7 06/05/2015: 6.1 08/04/2015: 10.3 10/12/2015: 13.29 (in-house)  11/04/2015: 12.83 01/13/2016: 8.57 02/24/2016: 8.41 04/27/2016: 9.22 05/20/2016: 8.32 06/17/2016: 6.45 07/15/2016: 5.07 07/29/2016: 6.51 09/09/16: 7.47 10/07/16: 7.85 11/04/16: 7.75 12/02/16: 8.13 01/06/17: 10.36 02/03/17: 12.60 03/03/18: 9.96  03/31/17:  12.7 04/14/17: 11.93 05/12/17: 13.00 06/09/17: 14.97 07/07/17: 15.22 08/04/17: 17.26   PATHOLOGY REPORT: Diagnosis 01/23/2015 Colon, segmental resection, with terminal ileum and cecum - INVASIVE ADENOCARCINOMA EXTENDING INTO PERICECAL CONNECTIVE TISSUE AND INVOLVING SMALL INTESTINE. - THREE BENIGN LYMPH NODES (0/3). - SEE ONCOLOGY TABLE BELOW.  Microscopic Comment COLON AND RECTUM (INCLUDING TRANS-ANAL RESECTION): Specimen: Terminal ileum and cecum. Procedure: Resection. Tumor site: Cecum adjacent to appendix. Specimen integrity: Intact. Macroscopic intactness of mesorectum: Not applicable. Macroscopic tumor perforation: No. Invasive tumor: Maximum size: 4.0 cm. Histologic type(s): Colorectal adenocarcinoma. Histologic grade and differentiation: G2: moderately differentiated/low grade. Type of polyp in which invasive carcinoma arose: No residual polyp. Microscopic extension of invasive tumor: Into pericecal connective tissue and terminal ileum. Lymph-Vascular invasion: Not identified. Peri-neural invasion: Not identified. Tumor deposit(s) (discontinuous extramural extension): No. Resection margins: Proximal margin: Free of tumor. Distal margin: Free of tumor. Circumferential (radial) (posterior ascending, posterior descending; lateral and posterior mid-rectum; and entire lower 1/3 rectum): Free of tumor. Mesenteric margin (sigmoid and transverse): N/A. Distance closest margin (if all above margins negative): N/A. Treatment effect (neo-adjuvant therapy): No. Additional polyp(s): No. Non-neoplastic findings: N/A. Lymph nodes: number examined 3; number positive: 0. Pathologic Staging: pT4b, pN0, pMX. Ancillary studies: Mismatch repair protein by immunohistochemistry pending. Comments: There is a colorectal-type adenocarcinoma arising in the cecum in the area of the appendiceal orifice. The tumor extends into the pericecal connective tissue, and involves the attached segment of  terminal ileum. Immunohistochemistry shows the tumor is positive with CDX2 and cytokeratin 20, and negative with cytokeratin 7, estrogen receptor and WT-1 supporting a diagnosis of primary colorectal adenocarcinoma. ADDITIONAL INFORMATION: Mismatch Repair (MMR) Protein Immunohistochemistry (IHC) IHC Expression Result: MLH1: Preserved nuclear expression (greater 50% tumor expression) MSH2: Preserved nuclear expression (greater 50%  tumor expression) MSH6: Preserved nuclear expression (greater 50% tumor expression) PMS2: Preserved nuclear expression (greater 50% tumor expression) * Internal control demonstrates intact nuclear expression Interpretation: NORMAL There is preserved expression of the major and minor MMR proteins. There is a very low probability that microsatellite instability (MSI) is present. However, certain clinically significant MMR protein mutations may result in preservation of nuclear expression. It is recommended that the preservation of protein expression be correlated with molecular based MSI testing.    RADIOGRAPHIC STUDIES: I have personally reviewed the radiological images as listed and agreed with the findings in the report. No results found.   ASSESSMENT & PLAN: 77 y.o. African-American female, with past medical history of moderate dementia, independent with ADLs, hypertension, presents with small bowel obstruction and weight loss.  1. 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 CKD stage III, right hydroureter 3. HTN, dementia  4. Nausea and diarrhea, secondary to chemotherapy 5. Goals of care discussion - patient and daughter previously agreed to DNR/DNI 6. Insomnia  7. Social support   Ms. Unterreiner appears stable. She completed another cycle of irinotecan on 08/04/17. avastin was held for proteinuria. She continues to tolerate treatment well. She is beginning PT/OT  therapy at home. Her daughter continues to care for the patient very well. She has no new concerns. She added MCT oil to her daily regimen, I will check with pharmacy to see if there are any interactions with her treatment. Labs reviewed, adequate to proceed with next cycle irinotecan with avastin today. Urine protein is 100, OK to treat. Will see her back in 2 weeks for f/u and next cycle. Plan to restage in early August 2019.   PLAN: -Labs reviewed, proceed with next cycle irinotecan/avastin today -Lab, f/u in 2 weeks with next cycle -Message to pharmacy re: MCT oil   All questions were answered. The patient knows to call the clinic with any problems, questions or concerns. No barriers to learning was detected.     Alla Feeling, NP 08/18/17

## 2017-08-18 NOTE — Patient Instructions (Signed)
[facility] Discharge Instructions for Patients Receiving Chemotherapy  Today you received the following chemotherapy agents Avastin,Irinotecan  To help prevent nausea and vomiting after your treatment, we encourage you to take your nausea medication as directed  If you develop nausea and vomiting that is not controlled by your nausea medication, call the clinic.   BELOW ARE SYMPTOMS THAT SHOULD BE REPORTED IMMEDIATELY:  *FEVER GREATER THAN 100.5 F  *CHILLS WITH OR WITHOUT FEVER  NAUSEA AND VOMITING THAT IS NOT CONTROLLED WITH YOUR NAUSEA MEDICATION  *UNUSUAL SHORTNESS OF BREATH  *UNUSUAL BRUISING OR BLEEDING  TENDERNESS IN MOUTH AND THROAT WITH OR WITHOUT PRESENCE OF ULCERS  *URINARY PROBLEMS  *BOWEL PROBLEMS  UNUSUAL RASH Items with * indicate a potential emergency and should be followed up as soon as possible.  Feel free to call the clinic should you have any questions or concerns. The clinic phone number is [phone].  Please show the CHEMO ALERT CARD at check-in to the Emergency Department and triage nurse.   

## 2017-08-18 NOTE — Telephone Encounter (Signed)
Patient's oncology team requested pharmacy look into interactions of [employer] of [employer] MCT oil for mental alertness with irinotecan/avastin and patient's maintenance meds. We conducted an interaction check of the ingredients from this particular MCT oil, which include caprylic acid, capric acid, and lauric acid. Informed patient's daughter that there were no known interactions with her chemotherapy. However, possible interactions were found with the caprylic acid in the MCT oil and patient's amlodipine. Informed patient's daughter that their concurrent use may lead to increased blood pressure lowering effects.  We recommended to daughter that if patient is to proceed on the MCT oil, then her blood pressure should be monitored closely as she may be at an increased risk of fainting/falling. Daughter agreed to notify team if any adverse events occur. Additionally, advised to start low at 1-2 administrations per day.   Patient's daughter was appreciative of call and understood plan.  Willia Craze, Pharmacy Student

## 2017-08-31 MED FILL — AMLODIPINE BESYLATE 5 MG TA: 5 | 30 days supply | Qty: 30 | Fill #3

## 2017-09-01 ENCOUNTER — Telehealth: Payer: Self-pay | Admitting: Hematology

## 2017-09-01 ENCOUNTER — Inpatient Hospital Stay: Payer: Medicare Other

## 2017-09-01 ENCOUNTER — Inpatient Hospital Stay (HOSPITAL_BASED_OUTPATIENT_CLINIC_OR_DEPARTMENT_OTHER): Payer: Medicare Other | Admitting: Hematology

## 2017-09-01 ENCOUNTER — Other Ambulatory Visit (HOSPITAL_COMMUNITY): Payer: Self-pay | Admitting: Pharmacist

## 2017-09-01 VITALS — BP 123/79

## 2017-09-01 VITALS — BP 154/79 | HR 85 | Temp 97.8°F | Resp 18 | Ht 65.0 in | Wt 113.0 lb

## 2017-09-01 DIAGNOSIS — F0390 Unspecified dementia without behavioral disturbance: Secondary | ICD-10-CM

## 2017-09-01 DIAGNOSIS — C787 Secondary malignant neoplasm of liver and intrahepatic bile duct: Secondary | ICD-10-CM | POA: Diagnosis not present

## 2017-09-01 DIAGNOSIS — K521 Toxic gastroenteritis and colitis: Secondary | ICD-10-CM

## 2017-09-01 DIAGNOSIS — C182 Malignant neoplasm of ascending colon: Secondary | ICD-10-CM

## 2017-09-01 DIAGNOSIS — G4700 Insomnia, unspecified: Secondary | ICD-10-CM | POA: Diagnosis not present

## 2017-09-01 DIAGNOSIS — N134 Hydroureter: Secondary | ICD-10-CM

## 2017-09-01 DIAGNOSIS — D638 Anemia in other chronic diseases classified elsewhere: Secondary | ICD-10-CM

## 2017-09-01 DIAGNOSIS — I10 Essential (primary) hypertension: Secondary | ICD-10-CM

## 2017-09-01 DIAGNOSIS — C786 Secondary malignant neoplasm of retroperitoneum and peritoneum: Secondary | ICD-10-CM

## 2017-09-01 DIAGNOSIS — N183 Chronic kidney disease, stage 3 (moderate): Secondary | ICD-10-CM

## 2017-09-01 DIAGNOSIS — D649 Anemia, unspecified: Secondary | ICD-10-CM

## 2017-09-01 DIAGNOSIS — Z5112 Encounter for antineoplastic immunotherapy: Secondary | ICD-10-CM | POA: Diagnosis not present

## 2017-09-01 DIAGNOSIS — R918 Other nonspecific abnormal finding of lung field: Secondary | ICD-10-CM

## 2017-09-01 LAB — COMPREHENSIVE METABOLIC PANEL
ALBUMIN: 3.7 g/dL (ref 3.5–5.0)
ALK PHOS: 58 U/L (ref 38–126)
ALT: 12 U/L (ref 0–44)
ANION GAP: 9 (ref 5–15)
AST: 21 U/L (ref 15–41)
BUN: 16 mg/dL (ref 8–23)
CALCIUM: 8.7 mg/dL — AB (ref 8.9–10.3)
CO2: 22 mmol/L (ref 22–32)
Chloride: 110 mmol/L (ref 98–111)
Creatinine, Ser: 1.51 mg/dL — ABNORMAL HIGH (ref 0.44–1.00)
GFR, EST AFRICAN AMERICAN: 38 mL/min — AB (ref >60)
GFR, EST NON AFRICAN AMERICAN: 33 mL/min — AB (ref >60)
Glucose, Bld: 98 mg/dL (ref 70–99)
POTASSIUM: 4 mmol/L (ref 3.5–5.1)
Sodium: 141 mmol/L (ref 135–145)
Total Bilirubin: 0.5 mg/dL (ref 0.3–1.2)
Total Protein: 7 g/dL (ref 6.5–8.1)

## 2017-09-01 LAB — CBC WITH DIFFERENTIAL/PLATELET
BASOS ABS: 0.1 10*3/uL (ref 0.0–0.1)
BASOS PCT: 1 %
Eosinophils Absolute: 0.1 10*3/uL (ref 0.0–0.5)
Eosinophils Relative: 2 %
HEMATOCRIT: 33.3 % — AB (ref 34.8–46.6)
Hemoglobin: 10.7 g/dL — ABNORMAL LOW (ref 11.6–15.9)
LYMPHS PCT: 20 %
Lymphs Abs: 1.3 10*3/uL (ref 0.9–3.3)
MCH: 28.9 pg (ref 25.1–34.0)
MCHC: 32.1 g/dL (ref 31.5–36.0)
MCV: 90 fL (ref 79.5–101.0)
Monocytes Absolute: 0.7 10*3/uL (ref 0.1–0.9)
Monocytes Relative: 11 %
NEUTROS ABS: 4.4 10*3/uL (ref 1.5–6.5)
NEUTROS PCT: 66 %
PLATELETS: 339 10*3/uL (ref 145–400)
RBC: 3.7 MIL/uL (ref 3.70–5.45)
RDW: 18.2 % — ABNORMAL HIGH (ref 11.2–14.5)
WBC: 6.5 10*3/uL (ref 3.9–10.3)

## 2017-09-01 LAB — CEA (IN HOUSE-CHCC): CEA (CHCC-In House): 21.94 ng/mL — ABNORMAL HIGH (ref 0.00–5.00)

## 2017-09-01 LAB — TOTAL PROTEIN, URINE DIPSTICK: PROTEIN: 100 mg/dL — AB

## 2017-09-01 MED ORDER — PALONOSETRON HCL INJECTION 0.25 MG/5ML
0.2500 mg | Freq: Once | INTRAVENOUS | Status: AC
  Administered 2017-09-01: 0.25 mg via INTRAVENOUS

## 2017-09-01 MED ORDER — ATROPINE SULFATE 1 MG/ML IJ SOLN
INTRAMUSCULAR | Status: AC
Start: 2017-09-01 — End: ?
  Filled 2017-09-01: qty 1

## 2017-09-01 MED ORDER — SODIUM CHLORIDE 0.9 % IV SOLN
5.0000 mg/kg | Freq: Once | INTRAVENOUS | Status: AC
  Administered 2017-09-01: 275 mg via INTRAVENOUS
  Filled 2017-09-01: qty 11

## 2017-09-01 MED ORDER — SODIUM CHLORIDE 0.9 % IV SOLN
Freq: Once | INTRAVENOUS | Status: AC
  Administered 2017-09-01: 13:00:00 via INTRAVENOUS

## 2017-09-01 MED ORDER — ATROPINE SULFATE 1 MG/ML IJ SOLN
0.5000 mg | Freq: Once | INTRAMUSCULAR | Status: AC | PRN
  Administered 2017-09-01: 0.5 mg via INTRAVENOUS

## 2017-09-01 MED ORDER — DEXAMETHASONE SODIUM PHOSPHATE 10 MG/ML IJ SOLN
INTRAMUSCULAR | Status: AC
Start: 2017-09-01 — End: ?
  Filled 2017-09-01: qty 1

## 2017-09-01 MED ORDER — DEXAMETHASONE SODIUM PHOSPHATE 10 MG/ML IJ SOLN
10.0000 mg | Freq: Once | INTRAMUSCULAR | Status: AC
  Administered 2017-09-01: 10 mg via INTRAVENOUS

## 2017-09-01 MED ORDER — IRINOTECAN HCL CHEMO INJECTION 100 MG/5ML
150.0000 mg/m2 | Freq: Once | INTRAVENOUS | Status: AC
  Administered 2017-09-01: 240 mg via INTRAVENOUS
  Filled 2017-09-01: qty 12

## 2017-09-01 MED ORDER — SODIUM CHLORIDE 0.9% FLUSH
10.0000 mL | Freq: Once | INTRAVENOUS | Status: AC
  Administered 2017-09-01: 10 mL
  Filled 2017-09-01: qty 10

## 2017-09-01 MED ORDER — PALONOSETRON HCL INJECTION 0.25 MG/5ML
INTRAVENOUS | Status: AC
  Filled 2017-09-01: qty 5

## 2017-09-01 NOTE — Telephone Encounter (Signed)
PET scan on 7/22  Per 6/28 los

## 2017-09-01 NOTE — Progress Notes (Signed)
[facility]  [phone] [phone]  Clinic Follow Up Note   Patient Care Team: [name], MD as PCP - General (Family Medicine) [name], MD as Consulting Physician (Medical Oncology) [name], MD as Consulting Physician (General Surgery) [name], MD as Consulting Physician (Orthopedic Surgery) [name] [name], MD as Consulting Physician (Urology)   Date of Service:  09/01/2017     CHIEF COMPLAINTS:  Follow up metastatic colon cancer  Oncology History   Cancer of right colon [facility])   Staging form: Colon and Rectum, AJCC 7th Edition     Pathologic stage from 01/23/2015: Stage IIC (T4b, N0, cM0) - Signed by [name], MD on 02/05/2015       Cancer of right colon ([place])   01/21/2015 Imaging     CT abdomen and pelvis showed high-grade small bowel obstruction,  right I [place] ureteral nephrosis,  multiple liver cysts.  CT chest was negative for metastatic lesion.      01/23/2015 Pathology Results     invasive adenocarcinoma extending into the pericecal connective tissue,  grade 2, LVI (-),  no perineural invasion, 3 lymph nodes were negative.      01/23/2015 Surgery      terminal ileum and cecum seegmental resection with anastomosis by Dr. Hassell Done      01/23/2015 Initial Diagnosis    Cancer of right colon ([place])      03/13/2015 - 07/06/2015 Chemotherapy    Xeloda 1500 mg bid X 14 days on-7 off, s/p 5 cycles, stopped early due to wrosening renal function      10/28/2015 Progression    PET scan showed multiple hypermetabolic peritoneal nodules, right ovarian cystic mass, and hypermetabolic liver lesion, highly suspicious for metastatic colon cancer. Small pulmonary nodules are indeterminate.       11/11/2015 - 05/20/2016 Chemotherapy    Xeloda '1500mg'$  bid, 2 weeks on and one week off, Avastin was added on from cycle 3. Xeloda held from 05/20/16 due to progression on CT from 05/19/16 and total bilirubin increased to 2.6.      01/08/2016 Imaging    Ct chest, Abdomen Pelvis Wo Contrast 01/08/2016  IMPRESSION: 1. 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 2. Hepatic and renal cysts. 3.  Aortoiliac atherosclerotic vascular disease. 4. Lumbar spondylosis and degenerative disc disease with multilevel Impingement.      05/19/2016 Imaging    CT CAP w Contrast IMPRESSION: 1. Numerous small solid pulmonary nodules, one of which is new, and several of which have mildly increased in size since 10/28/2015, worrisome for mild progression of pulmonary metastases. 2. Solitary right liver lobe metastasis is mildly increased in size since 01/08/2016. 3. Peritoneal metastases have mildly increased in size since 01/08/2016, largest in the right pelvic sidewall. 4. Well-positioned right nephroureteral stent without overt right hydronephrosis. 5. Additional findings include aortic atherosclerosis, 1 vessel coronary atherosclerosis and mild to moderate emphysema.      06/03/2016 -  Chemotherapy    Second-Line Irinotecan and Avastin every 2 weeks starting 06/03/16      07/29/2016 Tumor Marker    CEA 6.51       08/09/2016 Imaging    PET  IMPRESSION: 1. Overall mild improvement compared to FDG PET scan of 10/28/2015. 2. Pulmonary nodules are increased in size from PET-CT scan and similar size to most recent CT scan. Largest lesion RIGHT lower lobe does have associated faint metabolic activity consistent with pulmonary metastasis. 3. Decrease in metabolic  activity of RIGHT hepatic lobe lesion. Potential second lesion LEFT hepatic lobe with slightly lower metabolic activity. 4. Decreased metabolic activity at the enteric colonic anastomosis in the RIGHT lower quadrant. 5. Interval decrease in metabolic activity of peritoneal nodular metastasis. 6. Nodular implant implant along ventral abdominal wall may be within the musculature, but also improved.       09/09/2016 Tumor Marker    CEA: 7.47      10/07/2016 Tumor Marker    CEA: 7.85      11/04/2016 Tumor Marker    CEA: 7.75      11/25/2016 PET scan    PET 11/25/16 IMPRESSION: 1. Interval worsening in the interval. Numerous pulmonary nodules are again seen. While multiple nodules are stable, there are several nodules which are a little larger in the interval. They remain too small to completely characterize with PET-CT. The 2 hepatic metastases demonstrate increased FDG uptake on today's study compared to the previous study. Abdominal wall and peritoneal metastases are mildly worsened.      12/02/2016 Tumor Marker    CEA: 8.13      01/06/2017 Tumor Marker    CEA: 10.36      02/03/2017 Tumor Marker    CEA: 12.60      03/03/2017 Tumor Marker    CEA: 9.96      03/13/2017 PET scan    IMPRESSION: 1. Hepatic metastases are stable in size with stable to minimally decreased hypermetabolism. 2. Inferior left rectus abdominus metastasis shows mildly decreased hypermetabolism. 3. Omental nodule no longer shows abnormal hypermetabolism. 4. Scattered pulmonary nodules are stable in size and too small for PET resolution. 5.  Aortic atherosclerosis (ICD10-170.0). 6.  Emphysema (ICD10-J43.9).      07/03/2017 Imaging    PET, 07/03/2017 IMPRESSION: 1. General mild worsening, with increased activity in the hepatic metastatic lesions, mesenteric and omental nodularity, and with new/worsening metastatic nodularity in the right lower quadrant below the cecum. 2. There are over 20 small pulmonary nodules which are below sensitive PET-CT size thresholds and stable from the prior exam, but not currently hypermetabolic. 3. Other imaging findings of potential clinical significance: Aortic Atherosclerosis (ICD10-I70.0) and Emphysema (ICD10-J43.9). Coronary atherosclerosis. Uterine fibroid. Small amount of free pelvic fluid in the cul-de-sac. Old pelvic deformities from prior  fractures. Degenerative hip and glenohumeral arthropathy. Spondylosis.       HISTORY OF PRESENTING ILLNESS (02/05/2015):  Beth Wilkins 77 y.o. female  with past medical history of moderate dementia, hypertension, is here because of recently diagnosed stage II colon cancer. She is accompanied to the clinic by her daughter. History is mainly obtained from the chart and her daughter.  She has had abdominal discomfort, nausea and vomiting, and anorexia for  the past to 3 months along with 15 pounds weight loss, . Her nausea was mild and intermittent, she does not seek medical attention initially. She presented with  worsening symptoms for 2-3 weeks, and episodes of projectile vomiting and dehydration to emergency room, was admitted to [facility] on 01/20/2015. CT scan reviewed small bowel obstruction, and possible appendiceal rupture. She was brought to OR on 01/23/2015, underwent right hemicolectomy with anastomosis by Dr. Hassell Done. Surgical path reviewed adenocarcinoma at the cecum. She was discharged home on 01/26/2015.  She has recovered well, eating much better than prior to surgery, move around without restriction. No pain, she has loose BM 3 times a day, and she has backed off her miralax.  she lives with her daughter. She states her appetite is  moderate, sometime low, and she refuses to eat if she doesn't have appetite. No nausea, vomiting, abdominal bloating since her surgery.  Her last colonoscopy was 6-8 years ago, and her stool OB was negative this year.   CURRENT THERAPY: Second-Line Irinotecan and Avastin every 2 weeks started 06/03/16, Avastin held as needed due to proteinuria  INTERIM HISTORY:    Beth Wilkins is here for a follow up and Irinotecan/Avastin. She is accompanied by her daughter. She notes she has been doing well. She has been taking MCT oil from [employer]. She takes 2 tsp a day with every other meal.  She denies having any pain or stomach issues with bloating or  nausea. She is overall stable. She was discharged from her home care nurse and her PT finished last week. Her Speech therapy is going well. Marland Mcalpine her home nurse comes 2 days/week.     MEDICAL HISTORY:  Past Medical History:  Diagnosis Date  . Anxiety   . Aortic atherosclerosis ([place]) 03/15/2017   noted on PET   . Arthritis   . Bilateral renal cysts   . Centrilobular emphysema ([place]) per 05-10-16 chest xray  . CKD (chronic kidney disease), stage III ([place])   . Colon cancer [facility]) oncologist-  dr [name]   dx 01-23-2015  right colon Invasive adenocarcinoma into pericecal connective tissue and involving small intestine , Stage IIc, Grade 2, LVI(-),  (pT4b N0 M0) /  s/p resection termial ileum and cecum and chemotherapy 01-06-217 to 07-06-2015/   08/ 2017  per PET  recurrent w/ liver mets and peritoneal carcinomatosis  . Complication of anesthesia    can be combative due to dementia  . Full dentures   . Headache    occ stress related  . History of chemotherapy    every other friday currently receiving  . History of gastroesophageal reflux (GERD)    changes diet  . History of pelvic fracture 2015  . Hyperlipidemia   . Hypertension   . Liver metastasis ([place])   . 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 N&V (nausea and vomiting)   . Pulmonary nodules   . Right ovarian cyst   . SBO (small bowel obstruction) ([place]) 01/2015  . 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 UTI (urinary tract infection) 09/26/2016   5 day antibiotic treatment    SURGICAL HISTORY: Past Surgical History:  Procedure Laterality Date  . CATARACT EXTRACTION W/ INTRAOCULAR LENS  IMPLANT, BILATERAL  2005 approx  . CYSTOSCOPY W/ URETERAL STENT PLACEMENT Right 12/03/2015   Procedure: CYSTOSCOPY WITH RETROGRADE PYELOGRAM/URETERAL STENT  PLACEMENT;  Surgeon: Cleon Gustin, MD;  Location: [facility];  Service: Urology;  Laterality: Right;  . CYSTOSCOPY W/ URETERAL STENT PLACEMENT Right 02/15/2016   Procedure: CYSTOSCOPY WITH RETROGRADE PYELOGRAM/URETERAL STENT REPLACEMENT;  Surgeon: Cleon Gustin, MD;  Location: [facility];  Service: Urology;  Laterality: Right;  . CYSTOSCOPY W/ URETERAL STENT PLACEMENT Right 03/28/2016   Procedure: CYSTOSCOPY WITH RETROGRADE PYELOGRAM/URETERAL STENT EXCHANGE;  Surgeon: Cleon Gustin, MD;  Location: [facility];  Service: Urology;  Laterality: Right;  . CYSTOSCOPY W/ URETERAL STENT PLACEMENT Right 07/18/2016   Procedure: CYSTOSCOPY WITH RETROGRADE PYELOGRAM/ STENT EXCHANGE;  Surgeon: Cleon Gustin, MD;  Location: [facility];  Service: Urology;  Laterality: Right;  . CYSTOSCOPY W/ URETERAL STENT PLACEMENT Right 10/10/2016   Procedure: CYSTOSCOPY WITH RETROGRADE PYELOGRAM/URETERAL STENT REPLACEMENT;  Surgeon: Cleon Gustin, MD;  Location: [facility];  Service: Urology;  Laterality: Right;  . CYSTOSCOPY W/ URETERAL STENT PLACEMENT Right 04/10/2017   Procedure: CYSTOSCOPY WITH RETROGRADE PYELOGRAM/URETERAL STENT EXCHANGE;  Surgeon: Cleon Gustin, MD;  Location: [facility];  Service: Urology;  Laterality: Right;  . CYSTOSCOPY WITH RETROGRADE PYELOGRAM, URETEROSCOPY AND STENT PLACEMENT Right 09/21/2015   Procedure: CYSTOSCOPY WITH BIL RETROGRADE PYELOGRAM, URETEROSCOPY, RIGHT STENT PLACEMENT ;  Surgeon: Cleon Gustin, MD;  Location: [facility];  Service: Urology;  Laterality: Right;  . HEMICOLECTOMY Right 01/23/2015   Dr. Hassell Done  . IR FLUORO GUIDE PORT INSERTION RIGHT  03/24/2017  . IR US GUIDE VASC ACCESS RIGHT  03/24/2017  . LAPAROSCOPY N/A 01/23/2015   Procedure: DIAGNOSTIC LAPAROSCOPY, EXPLORATORY LAPAROTOMY  RIGHT HEMI-COLECTOMY FOR OBSTRUCTION OF RIGHT TERMINAL  ILEUM;  Surgeon: [name], MD;  Location: [facility];  Service: General;  Laterality: N/A;    SOCIAL HISTORY: Social History   Socioeconomic History  . Marital status: Single    Spouse name: Not on file  . Number of children: Not on file  . Years of education: Not on file  . Highest education level: Not on file  Occupational History  . Not on file  Social Needs  . Financial resource strain: Not on file  . Food insecurity:    Worry: Not on file    Inability: Not on file  . Transportation needs:    Medical: Not on file    Non-medical: Not on file  Tobacco Use  . Smoking status: Former Smoker    Packs/day: 0.25    Years: 50.00    Pack years: 12.50    Types: Cigarettes    Last attempt to quit: 01/25/2015    Years since quitting: 2.6  . Smokeless tobacco: Never Used  Substance and Sexual Activity  . Alcohol use: Yes    Alcohol/week: 1.8 oz    Types: 2 Glasses of wine, 1 Cans of beer per week    Comment: occasional   . Drug use: No  . Sexual activity: Not on file  Lifestyle  . Physical activity:    Days per week: Not on file    Minutes per session: Not on file  . Stress: Not on file  Relationships  . Social connections:    Talks on phone: Not on file    Gets together: Not on file    Attends religious service: Not on file    Active member of club or organization: Not on file    Attends meetings of clubs or organizations: Not on file    Relationship status: Not on file  . Intimate partner violence:    Fear of current or ex partner: Not on file    Emotionally abused: Not on file    Physically abused: Not on file    Forced sexual activity: Not on file  Other Topics Concern  . Not on file  Social History Narrative   Legally separated   Lives w/daughter, Kaylin Marcon   Has total of #2 daughters and #1 son   Dementia   Fall Risk-ambulates w/cane    FAMILY HISTORY: Family History  Problem Relation Age of Onset  . Diabetes Mellitus II Father   . Cancer  Mother 62       ovarian cancer   . Cervical  cancer Other     ALLERGIES:  is allergic to namenda [memantine hcl] and oxycodone.  MEDICATIONS:  Current Outpatient Medications  Medication Sig Dispense Refill  . amLODipine (NORVASC) 5 MG tablet Take 5 mg by mouth every morning.     Marland Kitchen aspirin 81 MG tablet Take 81 mg by mouth daily.    . Cholecalciferol (VITAMIN D3) 2000 UNITS TABS Take by mouth daily.    . Coenzyme Q10 (CO Q 10) 100 MG CAPS Take by mouth daily.    Marland Kitchen lidocaine-prilocaine (EMLA) cream Apply 1 application topically as needed. 30 g 5  . medium chain triglycerides (MCT OIL) oil Take 5 mLs by mouth 2 (two) times daily.     . Melatonin 5 MG TABS Take 5-15 mg by mouth at bedtime as needed.    . Meth-Hyo-M Bl-Na Phos-Ph Sal (URIBEL) 118 MG CAPS Take 1 capsule (118 mg total) by mouth 2 (two) times daily as needed. 30 capsule 3  . mirabegron ER (MYRBETRIQ) 50 MG TB24 tablet Take 1 tablet (50 mg total) by mouth daily. 30 tablet 11  . mirtazapine (REMERON) 15 MG tablet TAKE 1 TABLET BY MOUTH AT BEDTIME 30 tablet 3  . Multiple Vitamins-Minerals (WOMENS MULTIVITAMIN PO) Take 1 tablet by mouth daily. With iron    . polyethylene glycol (MIRALAX / GLYCOLAX) packet Take 17 g by mouth daily. (Patient taking differently: Take 17 g by mouth daily as needed. ) 14 each 0  . prochlorperazine (COMPAZINE) 5 MG tablet Take 1 tablet (5 mg total) by mouth every 6 (six) hours as needed for nausea or vomiting. 30 tablet 0  . QUEtiapine (SEROQUEL) 25 MG tablet Take 1 tablet each night 30 tablet 6  . traMADol (ULTRAM) 50 MG tablet Take 1 tablet (50 mg total) by mouth every 6 (six) hours as needed. 30 tablet 0  . UNABLE TO FIND Med Name: Closys Mouth Rinse two-three times daily for ulcer.    . vitamin B-12 (CYANOCOBALAMIN) 1000 MCG tablet Take 1,000 mcg by mouth daily.     No current facility-administered medications for this visit.    Facility-Administered Medications Ordered in Other Visits  Medication  Dose Route Frequency Provider Last Rate Last Dose  . heparin lock flush 100 unit/mL  500 Units Intravenous Once [name], MD        REVIEW OF SYSTEMS:   Constitutional: Denies fevers, chills or abnormal night sweats Eyes: Denies blurriness of vision, double vision or watery eyes  Ears, nose, mouth, throat, and face: Denies mucositis or sore throat  Respiratory: Denies cough, dyspnea or wheezes Cardiovascular: Denies palpitation, chest discomfort or lower extremity swelling Gastrointestinal:  Denies heartburn   Urinary:  Denies Dysuria  Skin: Denies abnormal skin rashes Lymphatics: Denies new lymphadenopathy or easy bruising Neurological:Denies numbness, tingling or new weaknesses Behavioral/Psych: Mood is stable, no new changes (+) Dementia, stable  All other systems were reviewed with the patient and are negative.  PHYSICAL EXAMINATION:  ECOG PERFORMANCE STATUS: 1 - Symptomatic but completely ambulatory  Vitals:   09/01/17 1120  BP: (!) 154/79  Pulse: 85  Resp: 18  Temp: 97.8 F (36.6 C)  SpO2: 100%   Filed Weights   09/01/17 1120  Weight: 113 lb (51.3 kg)     GENERAL:alert, no distress and comfortable SKIN: skin color, texture, turgor are normal, no rashes or significant lesions EYES: normal, conjunctiva are pink and non-injected, sclera clear (+) difficult to evaluate due to her dementia OROPHARYNX:no exudate (+) Upper and lower dentures  (+)  slight color change of tongue  NECK: supple, thyroid normal size, non-tender, without nodularity LYMPH:  no palpable lymphadenopathy in the cervical, axillary or inguinal LUNGS: clear to auscultation and percussion with normal breathing effort HEART: regular rate & rhythm and no murmurs and no lower extremity edema ABDOMEN:abdomen soft, non-tender and normal bowel sounds  Musculoskeletal:no cyanosis of digits and no clubbing  PSYCH: alert and with fluent speech, (+) dementia  NEURO: no focal motor/sensory deficits  LABORATORY  DATA:  I have reviewed the data as listed CBC Latest Ref Rng & Units 09/01/2017 08/18/2017 08/04/2017  WBC 3.9 - 10.3 K/uL 6.5 5.8 5.8  Hemoglobin 11.6 - 15.9 g/dL 10.7(L) 11.5(L) 10.9(L)  Hematocrit 34.8 - 46.6 % 33.3(L) 35.0 32.6(L)  Platelets 145 - 400 K/uL 339 349 382    CMP Latest Ref Rng & Units 09/01/2017 08/18/2017 08/04/2017  Glucose 70 - 99 mg/dL 98 76 92  BUN 8 - 23 mg/dL '16 13 14  '$ Creatinine 0.44 - 1.00 mg/dL 1.51(H) 1.15(H) 1.21(H)  Sodium 135 - 145 mmol/L 141 139 139  Potassium 3.5 - 5.1 mmol/L 4.0 4.2 3.9  Chloride 98 - 111 mmol/L 110 103 108  CO2 22 - 32 mmol/L '22 24 23  '$ Calcium 8.9 - 10.3 mg/dL 8.7(L) 9.6 8.8  Total Protein 6.5 - 8.1 g/dL 7.0 7.6 6.9  Total Bilirubin 0.3 - 1.2 mg/dL 0.5 0.3 0.3  Alkaline Phos 38 - 126 U/L 58 86 68  AST 15 - 41 U/L '21 22 16  '$ ALT 0 - 44 U/L '12 10 8    '$ CEA   01/23/2015: 5.7 06/05/2015: 6.1 08/04/2015: 10.3 10/12/2015: 13.29 (in-house)  11/04/2015: 12.83 01/13/2016: 8.57 02/24/2016: 8.41 04/27/2016: 9.22 05/20/2016: 8.32 06/17/2016: 6.45 07/15/2016: 5.07 07/29/2016: 6.51 09/09/16: 7.47 10/07/16: 7.85 11/04/16: 7.75 12/02/16: 8.13 01/06/17: 10.36 02/03/17: 12.60 03/03/18: 9.96  03/31/17: 12.7 04/14/17: 11.93 05/12/17: 13.00 06/09/17: 14.97 07/07/17: 15.22 08/04/17: 17.26 09/01/17: PENDING    PATHOLOGY REPORT: Diagnosis 01/23/2015 Colon, segmental resection, with terminal ileum and cecum - INVASIVE ADENOCARCINOMA EXTENDING INTO PERICECAL CONNECTIVE TISSUE AND INVOLVING SMALL INTESTINE. - THREE BENIGN LYMPH NODES (0/3). - SEE ONCOLOGY TABLE BELOW.  Microscopic Comment COLON AND RECTUM (INCLUDING TRANS-ANAL RESECTION): Specimen: Terminal ileum and cecum. Procedure: Resection. Tumor site: Cecum adjacent to appendix. Specimen integrity: Intact. Macroscopic intactness of mesorectum: Not applicable. Macroscopic tumor perforation: No. Invasive tumor: Maximum size: 4.0 cm. Histologic type(s): Colorectal adenocarcinoma. Histologic grade and  differentiation: G2: moderately differentiated/low grade. Type of polyp in which invasive carcinoma arose: No residual polyp. Microscopic extension of invasive tumor: Into pericecal connective tissue and terminal ileum. Lymph-Vascular invasion: Not identified. Peri-neural invasion: Not identified. Tumor deposit(s) (discontinuous extramural extension): No. Resection margins: Proximal margin: Free of tumor. Distal margin: Free of tumor. Circumferential (radial) (posterior ascending, posterior descending; lateral and posterior mid-rectum; and entire lower 1/3 rectum): Free of tumor. Mesenteric margin (sigmoid and transverse): N/A. Distance closest margin (if all above margins negative): N/A. Treatment effect (neo-adjuvant therapy): No. Additional polyp(s): No. Non-neoplastic findings: N/A. Lymph nodes: number examined 3; number positive: 0. Pathologic Staging: pT4b, pN0, pMX. Ancillary studies: Mismatch repair protein by immunohistochemistry pending. Comments: There is a colorectal-type adenocarcinoma arising in the cecum in the area of the appendiceal orifice. The tumor extends into the pericecal connective tissue, and involves the attached segment of terminal ileum. Immunohistochemistry shows the tumor is positive with CDX2 and cytokeratin 20, and negative with cytokeratin 7, estrogen receptor and WT-1 supporting a diagnosis of primary colorectal adenocarcinoma. ADDITIONAL INFORMATION: Mismatch Repair (MMR) Protein  Immunohistochemistry (IHC) IHC Expression Result: MLH1: Preserved nuclear expression (greater 50% tumor expression) MSH2: Preserved nuclear expression (greater 50% tumor expression) MSH6: Preserved nuclear expression (greater 50% tumor expression) PMS2: Preserved nuclear expression (greater 50% tumor expression) * Internal control demonstrates intact nuclear expression Interpretation: NORMAL There is preserved expression of the major and minor MMR proteins. There is a very  low probability that microsatellite instability (MSI) is present. However, certain clinically significant MMR protein mutations may result in preservation of nuclear expression. It is recommended that the preservation of protein expression be correlated with molecular based MSI testing.    RADIOGRAPHIC STUDIES: I have personally reviewed the radiological images as listed and agreed with the findings in the report.  PET, 07/03/2017 IMPRESSION: 1. General mild worsening, with increased activity in the hepatic metastatic lesions, mesenteric and omental nodularity, and with new/worsening metastatic nodularity in the right lower quadrant below the cecum. 2. There are over 20 small pulmonary nodules which are below sensitive PET-CT size thresholds and stable from the prior exam, but not currently hypermetabolic. 3. Other imaging findings of potential clinical significance: Aortic Atherosclerosis (ICD10-I70.0) and Emphysema (ICD10-J43.9). Coronary atherosclerosis. Uterine fibroid. Small amount of free pelvic fluid in the cul-de-sac. Old pelvic deformities from prior fractures. Degenerative hip and glenohumeral arthropathy. Spondylosis.  PET 03/13/17 IMPRESSION: 1. Hepatic metastases are stable in size with stable to minimally decreased hypermetabolism. 2. Inferior left rectus abdominus metastasis shows mildly decreased hypermetabolism. 3. Omental nodule no longer shows abnormal hypermetabolism. 4. Scattered pulmonary nodules are stable in size and too small for PET resolution. 5.  Aortic atherosclerosis (ICD10-170.0). 6.  Emphysema (ICD10-J43.9).  PET 11/25/16 IMPRESSION: 1. Interval worsening in the interval. Numerous pulmonary nodules are again seen. While multiple nodules are stable, there are several nodules which are a little larger in the interval. They remain too small to completely characterize with PET-CT. The 2 hepatic metastases demonstrate increased FDG uptake on today's study  compared to the previous study. Abdominal wall and peritoneal metastases are mildly worsened.  PET 08/09/2016 IMPRESSION: 1. Overall mild improvement compared to FDG PET scan of 10/28/2015. 2. Pulmonary nodules are increased in size from PET-CT scan and similar size to most recent CT scan. Largest lesion RIGHT lower lobe does have associated faint metabolic activity consistent with pulmonary metastasis. 3. Decrease in metabolic activity of RIGHT hepatic lobe lesion. Potential second lesion LEFT hepatic lobe with slightly lower metabolic activity. 4. Decreased metabolic activity at the enteric colonic anastomosis in the RIGHT lower quadrant. 5. Interval decrease in metabolic activity of peritoneal nodular metastasis. 6. Nodular implant along ventral abdominal wall may be within the musculature, but also improved.  CT CAP w Contrast 05/19/2016 IMPRESSION: 1. Numerous small solid pulmonary nodules, one of which is new, and several of which have mildly increased in size since 10/28/2015, worrisome for mild progression of pulmonary metastases. 2. Solitary right liver lobe metastasis is mildly increased in size since 01/08/2016. 3. Peritoneal metastases have mildly increased in size since 01/08/2016, largest in the right pelvic sidewall. 4. Well-positioned right nephroureteral stent without overt right hydronephrosis. 5. Additional findings include aortic atherosclerosis, 1 vessel coronary atherosclerosis and mild to moderate emphysema.  Ct chest, Abdomen Pelvis Wo Contrast 01/08/2016  IMPRESSION: 1. 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 2. Hepatic and renal cysts. 3.  Aortoiliac atherosclerotic vascular disease. 4. Lumbar spondylosis and degenerative disc disease with multilevel impingement.  ASSESSMENT & PLAN: 77 y.o. African-American female, with past medical history of moderate  dementia, independent with ADLs, hypertension,  presents with small bowel obstruction and weight loss.  1. 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 -I personally reviewed her recent PET scan images, unfortunately the scan showed hypermetabolic peritoneal and liver metastases. 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 The tumor has MSI stable, she is not a candidate for immunotherapy, although she may be a candidate for clinical trial of immunotherapy plus MEK inhibitor at [facility]. -She started Xeloda '1500mg'$  bid, 2 weeks on and one week off on 11/11/15 and stopped 05/20/16 due to disease progression.  -I previously reviewed her restaging CT scan from 01/08/2016, which showed overall stable disease, no new lesions.  -I previously reviewed the CT from 05/19/2016 with the patient and her daughter in detail. She has had a moderate disease progression in lungs, liver and peritoneum. I recommended her to change treatment. -She started second line single agent irinotecan on 06/03/16 at a reduced dose '150mg'$ /m2, tolerated well. - PET Scan image from 08/09/16 showed good partial response to treatment. We will continue irinotecan and Avastin. -We previously discussed her 11/25/16 PET scan. She has small increase in size of some of her small lung nodules, and mild increased FDG of her liver mets, but overall stable disease. I think the  benefit of current treatment is getting smaller. We may need to change treatment in 2 months after a repeat scan. Will continue Irinotecan and avastin for now. She agrees.   -PET scan on 03/13/17 revealed that hepatic metastases are stable in size with stable to minimally decreased hypermetabolism, the inferior left rectus abdominus metastasis shows mildly decreased hypermetabolism, the omental nodule no longer shows abnormal hypermetabolism. Scattered pulmonary nodules are stable in size and too small for PET resolution. 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 Previously discussed with pt and daughter. They are very pleased. -Her urine proteins were previously elevated at above 300 and Avastin has been held intermittently since 03/17/17 if urine protein >=300.  -labs reviewed, adequate proceed with Irinotecan and Avastin held today due to proteinuria. Will check at next visit lab.  -PET Scan 07/03/2017 reviewed mild disease progression, with mainly increased FDG uptake, the overall metastatic lesion sizes have not changed significantly.  -If she has further disease progression or she experiences side effects from chemo, I will stop chemo treatment. I do not recommend saline chemo, due to the low benefit and her medical conditions especially dementia. -Labs reviewed, Hg at 10.7 today, likely anemia of chronic disease. I recommend she take daily multivitamin has iron. Cr at 1.51, Ca at 8.7. Urine protein at 100 today, will proceed with Irinotecan and avastin today.  -Repeat PET scan in 4 weeks.   2. CKD stage III, right hydroureter  -A previous CT scan showed right hydroureter, which probably contributes to her renal insufficiency. -she had ureter stent placement again in 12/2015, 03/2016, 04/2016, and 04/2017 -Cr at 1.51 today  3. HTN, dementia   -She will continue her medication and follow-up with her primary care physician. -Her BP has been better controlled lately, we previously discussed that  Avastin can cause hypertension, we'll continue monitoring closely. -Her dementia has got worse gradually, which has increased her care burden on her daughter. I previously discussed burn-out prevention with her daughter, she appreciated and now doing better with home assistance for her mother.  -Dementia better on Seroquel -BP increased to 154/70 today (09/01/17)  4. Goal of care discussion  -We previously discussed the incurable nature of her cancer, and the overall poor prognosis, especially if she has progress on chemo -The patient understands the goal of care is palliative. -Due to her advanced age and comorbidities, especially dementia, I have low threshold to switch her to palliative care alone if she does not tolerate chemo well or dementia gets worse, her daughter is on -board  -I recommend DNR/DNI, pt and her daughter agrees.  -She has a caregiver and is under advanced home care. They plan to continue with chemotherapy as long as she can tolerate it.   5. Insomnia  -I first suggested melatonin over-the counter and to check with her neurologist.  -If that does not help she can use prescription medication.  -Suggested she increase benadryl to 1.5-2 tabs as needed. She will try white noise machine for non-pharm intervention. -Much better sleeping on Seroquel    6. Social support -Due to her dementia and chemotherapy, she requires 24/7 care at home. Her daughter is her only caregiver. I previously sent referrals to aid in getting pt home assistance.  -She started advanced homecare on 08/07/17. She was discharged from her home care nurse and her PT finished last week. Her Speech therapy is going well.   -She has a care giver Pamala Hurry who comes in 2 days/week  Plan -Patient is clinically doing well, labs reviewed, adequate for treatment, will proceed Irinotecan and Avastin today  -PET scan on 7/22 -lab, flush, f/u and chemo in 2 and 4 weeks    All questions were answered. The patient  knows to call the clinic with any problems, questions or concerns.  I spent 20 minutes counseling the patient face to face. The total time spent in the appointment was 25 minutes and more than 50% was on counseling.  Oneal Deputy, am acting as scribe for [name], MD.   I have reviewed the above documentation for accuracy and completeness, and I agree with the above.     [name]  09/01/2017 11:51 AM

## 2017-09-01 NOTE — Patient Instructions (Signed)
[facility] Discharge Instructions for Patients Receiving Chemotherapy  Today you received the following chemotherapy agents Irinotecan, Avastin  To help prevent nausea and vomiting after your treatment, we encourage you to take your nausea medication as directed   If you develop nausea and vomiting that is not controlled by your nausea medication, call the clinic.   BELOW ARE SYMPTOMS THAT SHOULD BE REPORTED IMMEDIATELY:  *FEVER GREATER THAN 100.5 F  *CHILLS WITH OR WITHOUT FEVER  NAUSEA AND VOMITING THAT IS NOT CONTROLLED WITH YOUR NAUSEA MEDICATION  *UNUSUAL SHORTNESS OF BREATH  *UNUSUAL BRUISING OR BLEEDING  TENDERNESS IN MOUTH AND THROAT WITH OR WITHOUT PRESENCE OF ULCERS  *URINARY PROBLEMS  *BOWEL PROBLEMS  UNUSUAL RASH Items with * indicate a potential emergency and should be followed up as soon as possible.  Feel free to call the clinic should you have any questions or concerns. The clinic phone number is [phone].  Please show the [facility] at check-in to the Emergency Department and triage nurse.

## 2017-09-01 NOTE — Progress Notes (Signed)
09/01/17 @  1320  Received call from [facility] stating she spoke with Dr Ernestina Penna nurse Malachy Mood and did receive confirmation from Dr Burr Medico via Malachy Mood RN to give Avastin with 2+ UP. Medications today will be Irinotecan and Avastin per Dr Burr Medico.   T.O. Dr Feng/Cheryl RN/Melia RN/Beth Wilkins, PharmD

## 2017-09-02 ENCOUNTER — Encounter: Payer: Self-pay | Admitting: Hematology

## 2017-09-06 ENCOUNTER — Telehealth: Payer: Self-pay

## 2017-09-06 NOTE — Telephone Encounter (Signed)
Faxed signed [facility] and Plan of Care back to [facility]

## 2017-09-14 NOTE — Progress Notes (Signed)
Beth Wilkins  [phone] [phone]  Clinic Follow Up Note   Patient Care Team: [name], MD as PCP - General (Family Medicine) [name], MD as Consulting Physician (Medical Oncology) [name], MD as Consulting Physician (General Surgery) [name], MD as Consulting Physician (Orthopedic Surgery) [name] [name], MD as Consulting Physician (Urology)   Date of Service:  09/15/2017     CHIEF COMPLAINTS:  Follow up metastatic colon cancer  Oncology History   Cancer of right colon [facility])   Staging form: Colon and Rectum, AJCC 7th Edition     Pathologic stage from 01/23/2015: Stage IIC (T4b, N0, cM0) - Signed by [name], MD on 02/05/2015       Cancer of right colon ([place])   01/21/2015 Imaging     CT abdomen and pelvis showed high-grade small bowel obstruction,  right I [place] ureteral nephrosis,  multiple liver cysts.  CT chest was negative for metastatic lesion.      01/23/2015 Pathology Results     invasive adenocarcinoma extending into the pericecal connective tissue,  grade 2, LVI (-),  no perineural invasion, 3 lymph nodes were negative.      01/23/2015 Surgery      terminal ileum and cecum seegmental resection with anastomosis by Dr. Hassell Done      01/23/2015 Initial Diagnosis    Cancer of right colon ([place])      03/13/2015 - 07/06/2015 Chemotherapy    Xeloda 1500 mg bid X 14 days on-7 off, s/p 5 cycles, stopped early due to wrosening renal function      10/28/2015 Progression    PET scan showed multiple hypermetabolic peritoneal nodules, right ovarian cystic mass, and hypermetabolic liver lesion, highly suspicious for metastatic colon cancer. Small pulmonary nodules are indeterminate.       11/11/2015 - 05/20/2016 Chemotherapy    Xeloda 1580m bid, 2 weeks on and one week off, Avastin was added on from cycle 3. Xeloda held from 05/20/16 due to progression on CT from 05/19/16 and total bilirubin increased to 2.6.      01/08/2016 Imaging    Ct chest, Abdomen Pelvis Wo Contrast 01/08/2016  IMPRESSION: 1. 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 2. Hepatic and renal cysts. 3.  Aortoiliac atherosclerotic vascular disease. 4. Lumbar spondylosis and degenerative disc disease with multilevel Impingement.      05/19/2016 Imaging    CT CAP w Contrast IMPRESSION: 1. Numerous small solid pulmonary nodules, one of which is new, and several of which have mildly increased in size since 10/28/2015, worrisome for mild progression of pulmonary metastases. 2. Solitary right liver lobe metastasis is mildly increased in size since 01/08/2016. 3. Peritoneal metastases have mildly increased in size since 01/08/2016, largest in the right pelvic sidewall. 4. Well-positioned right nephroureteral stent without overt right hydronephrosis. 5. Additional findings include aortic atherosclerosis, 1 vessel coronary atherosclerosis and mild to moderate emphysema.      06/03/2016 -  Chemotherapy    Second-Line Irinotecan and Avastin every 2 weeks starting 06/03/16      07/29/2016 Tumor Marker    CEA 6.51       08/09/2016 Imaging    PET  IMPRESSION: 1. Overall mild improvement compared to FDG PET scan of 10/28/2015. 2. Pulmonary nodules are increased in size from PET-CT scan and similar size to most recent CT scan. Largest lesion RIGHT lower lobe does have associated faint metabolic activity consistent with pulmonary metastasis. 3. Decrease in metabolic  activity of RIGHT hepatic lobe lesion. Potential second lesion LEFT hepatic lobe with slightly lower metabolic activity. 4. Decreased metabolic activity at the enteric colonic anastomosis in the RIGHT lower quadrant. 5. Interval decrease in metabolic activity of peritoneal nodular metastasis. 6. Nodular implant implant along ventral abdominal wall may be within the musculature, but also improved.       09/09/2016 Tumor Marker    CEA: 7.47      10/07/2016 Tumor Marker    CEA: 7.85      11/04/2016 Tumor Marker    CEA: 7.75      11/25/2016 PET scan    PET 11/25/16 IMPRESSION: 1. Interval worsening in the interval. Numerous pulmonary nodules are again seen. While multiple nodules are stable, there are several nodules which are a little larger in the interval. They remain too small to completely characterize with PET-CT. The 2 hepatic metastases demonstrate increased FDG uptake on today's study compared to the previous study. Abdominal wall and peritoneal metastases are mildly worsened.      12/02/2016 Tumor Marker    CEA: 8.13      01/06/2017 Tumor Marker    CEA: 10.36      02/03/2017 Tumor Marker    CEA: 12.60      03/03/2017 Tumor Marker    CEA: 9.96      03/13/2017 PET scan    IMPRESSION: 1. Hepatic metastases are stable in size with stable to minimally decreased hypermetabolism. 2. Inferior left rectus abdominus metastasis shows mildly decreased hypermetabolism. 3. Omental nodule no longer shows abnormal hypermetabolism. 4. Scattered pulmonary nodules are stable in size and too small for PET resolution. 5.  Aortic atherosclerosis (ICD10-170.0). 6.  Emphysema (ICD10-J43.9).      07/03/2017 Imaging    PET, 07/03/2017 IMPRESSION: 1. General mild worsening, with increased activity in the hepatic metastatic lesions, mesenteric and omental nodularity, and with new/worsening metastatic nodularity in the right lower quadrant below the cecum. 2. There are over 20 small pulmonary nodules which are below sensitive PET-CT size thresholds and stable from the prior exam, but not currently hypermetabolic. 3. Other imaging findings of potential clinical significance: Aortic Atherosclerosis (ICD10-I70.0) and Emphysema (ICD10-J43.9). Coronary atherosclerosis. Uterine fibroid. Small amount of free pelvic fluid in the cul-de-sac. Old pelvic deformities from prior  fractures. Degenerative hip and glenohumeral arthropathy. Spondylosis.       HISTORY OF PRESENTING ILLNESS (02/05/2015):  Beth Wilkins 77 y.o. female  with past medical history of moderate dementia, hypertension, is here because of recently diagnosed stage II colon cancer. She is accompanied to the clinic by her daughter. History is mainly obtained from the chart and her daughter.  She has had abdominal discomfort, nausea and vomiting, and anorexia for  the past to 3 months along with 15 pounds weight loss, . Her nausea was mild and intermittent, she does not seek medical attention initially. She presented with  worsening symptoms for 2-3 weeks, and episodes of projectile vomiting and dehydration to emergency room, was admitted to [facility] on 01/20/2015. CT scan reviewed small bowel obstruction, and possible appendiceal rupture. She was brought to OR on 01/23/2015, underwent right hemicolectomy with anastomosis by Dr. Hassell Done. Surgical path reviewed adenocarcinoma at the cecum. She was discharged home on 01/26/2015.  She has recovered well, eating much better than prior to surgery, move around without restriction. No pain, she has loose BM 3 times a day, and she has backed off her Miralax.  she lives with her daughter. She states her appetite is  moderate, sometime low, and she refuses to eat if she doesn't have appetite. No nausea, vomiting, abdominal bloating since her surgery.  Her last colonoscopy was 6-8 years ago, and her stool OB was negative this year.   CURRENT THERAPY: Second-Line Irinotecan and Avastin every 2 weeks started 06/03/16, Avastin held as needed due to proteinuria  INTERIM HISTORY:   LANDYN LORINCZ is here for a follow up and Irinotecan/Avastin. She is accompanied by her daughter. She notes she is doing well and she is being seen by Pamala Hurry 3 days a week. She is no longer doing PT overall she has no complaints. She will or her daughter will notify clinic if she would like  a chemobreak. She has been tolerating treatment well.       MEDICAL HISTORY:  Past Medical History:  Diagnosis Date  . Anxiety   . Aortic atherosclerosis ([place]) 03/15/2017   noted on PET   . Arthritis   . Bilateral renal cysts   . Centrilobular emphysema ([place]) per 05-10-16 chest xray  . CKD (chronic kidney disease), stage III ([place])   . Colon cancer [facility]) oncologist-  dr [name]   dx 01-23-2015  right colon Invasive adenocarcinoma into pericecal connective tissue and involving small intestine , Stage IIc, Grade 2, LVI(-),  (pT4b N0 M0) /  s/p resection termial ileum and cecum and chemotherapy 01-06-217 to 07-06-2015/   08/ 2017  per PET  recurrent w/ liver mets and peritoneal carcinomatosis  . Complication of anesthesia    can be combative due to dementia  . Full dentures   . Headache    occ stress related  . History of chemotherapy    every other friday currently receiving  . History of gastroesophageal reflux (GERD)    changes diet  . History of pelvic fracture 2015  . Hyperlipidemia   . Hypertension   . Liver metastasis ([place])   . 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 N&V (nausea and vomiting)   . Pulmonary nodules   . Right ovarian cyst   . SBO (small bowel obstruction) ([place]) 01/2015  . 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 UTI (urinary tract infection) 09/26/2016   5 day antibiotic treatment    SURGICAL HISTORY: Past Surgical History:  Procedure Laterality Date  . CATARACT EXTRACTION W/ INTRAOCULAR LENS  IMPLANT, BILATERAL  2005 approx  . CYSTOSCOPY W/ URETERAL STENT PLACEMENT Right 12/03/2015   Procedure: CYSTOSCOPY WITH RETROGRADE PYELOGRAM/URETERAL STENT PLACEMENT;  Surgeon: Cleon Gustin, MD;  Location[facility];  Service: Urology;  Laterality: Right;   . CYSTOSCOPY W/ URETERAL STENT PLACEMENT Right 02/15/2016   Procedure: CYSTOSCOPY WITH RETROGRADE PYELOGRAM/URETERAL STENT REPLACEMENT;  Surgeon: Cleon Gustin, MD;  Location: [facility];  Service: Urology;  Laterality: Right;  . CYSTOSCOPY W/ URETERAL STENT PLACEMENT Right 03/28/2016   Procedure: CYSTOSCOPY WITH RETROGRADE PYELOGRAM/URETERAL STENT EXCHANGE;  Surgeon: Cleon Gustin, MD;  Location: [facility];  Service: Urology;  Laterality: Right;  . CYSTOSCOPY W/ URETERAL STENT PLACEMENT Right 07/18/2016   Procedure: CYSTOSCOPY WITH RETROGRADE PYELOGRAM/ STENT EXCHANGE;  Surgeon: Cleon Gustin, MD;  Location: [facility];  Service: Urology;  Laterality: Right;  . CYSTOSCOPY W/ URETERAL STENT PLACEMENT Right 10/10/2016   Procedure: CYSTOSCOPY WITH RETROGRADE PYELOGRAM/URETERAL STENT REPLACEMENT;  Surgeon: Cleon Gustin, MD;  [facility]  Service: Urology;  Laterality: Right;  . CYSTOSCOPY W/ URETERAL STENT PLACEMENT Right 04/10/2017   Procedure: CYSTOSCOPY WITH RETROGRADE PYELOGRAM/URETERAL STENT EXCHANGE;  Surgeon: Cleon Gustin, MD;  Location: [facility];  Service: Urology;  Laterality: Right;  . CYSTOSCOPY WITH RETROGRADE PYELOGRAM, URETEROSCOPY AND STENT PLACEMENT Right 09/21/2015   Procedure: CYSTOSCOPY WITH BIL RETROGRADE PYELOGRAM, URETEROSCOPY, RIGHT STENT PLACEMENT ;  Surgeon: Cleon Gustin, MD;  Location: [facility];  Service: Urology;  Laterality: Right;  . HEMICOLECTOMY Right 01/23/2015   Dr. Hassell Done  . IR FLUORO GUIDE PORT INSERTION RIGHT  03/24/2017  . IR US GUIDE VASC ACCESS RIGHT  03/24/2017  . LAPAROSCOPY N/A 01/23/2015   Procedure: DIAGNOSTIC LAPAROSCOPY, EXPLORATORY LAPAROTOMY  RIGHT HEMI-COLECTOMY FOR OBSTRUCTION OF RIGHT TERMINAL ILEUM;  Surgeon: [name], MD;  Location: [facility];  Service: General;  Laterality: N/A;    SOCIAL HISTORY: Social  History   Socioeconomic History  . Marital status: Single    Spouse name: Not on file  . Number of children: Not on file  . Years of education: Not on file  . Highest education level: Not on file  Occupational History  . Not on file  Social Needs  . Financial resource strain: Not on file  . Food insecurity:    Worry: Not on file    Inability: Not on file  . Transportation needs:    Medical: Not on file    Non-medical: Not on file  Tobacco Use  . Smoking status: Former Smoker    Packs/day: 0.25    Years: 50.00    Pack years: 12.50    Types: Cigarettes    Last attempt to quit: 01/25/2015    Years since quitting: 2.6  . Smokeless tobacco: Never Used  Substance and Sexual Activity  . Alcohol use: Yes    Alcohol/week: 1.8 oz    Types: 2 Glasses of wine, 1 Cans of beer per week    Comment: occasional   . Drug use: No  . Sexual activity: Not on file  Lifestyle  . Physical activity:    Days per week: Not on file    Minutes per session: Not on file  . Stress: Not on file  Relationships  . Social connections:    Talks on phone: Not on file    Gets together: Not on file    Attends religious service: Not on file    Active member of club or organization: Not on file    Attends meetings of clubs or organizations: Not on file    Relationship status: Not on file  . Intimate partner violence:    Fear of current or ex partner: Not on file    Emotionally abused: Not on file    Physically abused: Not on file    Forced sexual activity: Not on file  Other Topics Concern  . Not on file  Social History Narrative   Legally separated   Lives w/daughter, Mariluz Crespo   Has total of #2 daughters and #1 son   Dementia   Fall Risk-ambulates w/cane    FAMILY HISTORY: Family History  Problem Relation Age of Onset  . Diabetes Mellitus II Father   . Cancer Mother 20       ovarian cancer   . Cervical cancer Other     ALLERGIES:  is allergic to namenda [memantine hcl] and  oxycodone.  MEDICATIONS:  Current Outpatient  Medications  Medication Sig Dispense Refill  . amLODipine (NORVASC) 5 MG tablet Take 5 mg by mouth every morning.     Marland Kitchen aspirin 81 MG tablet Take 81 mg by mouth daily.    . Cholecalciferol (VITAMIN D3) 2000 UNITS TABS Take by mouth daily.    . Coenzyme Q10 (CO Q 10) 100 MG CAPS Take by mouth daily.    Marland Kitchen lidocaine-prilocaine (EMLA) cream Apply 1 application topically as needed. 30 g 5  . medium chain triglycerides (MCT OIL) oil Take 5 mLs by mouth 2 (two) times daily.     . Melatonin 5 MG TABS Take 5-15 mg by mouth at bedtime as needed.    . Meth-Hyo-M Bl-Na Phos-Ph Sal (URIBEL) 118 MG CAPS Take 1 capsule (118 mg total) by mouth 2 (two) times daily as needed. 30 capsule 3  . mirabegron ER (MYRBETRIQ) 50 MG TB24 tablet Take 1 tablet (50 mg total) by mouth daily. 30 tablet 11  . mirtazapine (REMERON) 15 MG tablet TAKE 1 TABLET BY MOUTH AT BEDTIME 30 tablet 3  . Multiple Vitamins-Minerals (WOMENS MULTIVITAMIN PO) Take 1 tablet by mouth daily. With iron    . polyethylene glycol (MIRALAX / GLYCOLAX) packet Take 17 g by mouth daily. (Patient taking differently: Take 17 g by mouth daily as needed. ) 14 each 0  . prochlorperazine (COMPAZINE) 5 MG tablet Take 1 tablet (5 mg total) by mouth every 6 (six) hours as needed for nausea or vomiting. 30 tablet 0  . QUEtiapine (SEROQUEL) 25 MG tablet Take 1 tablet each night 30 tablet 6  . traMADol (ULTRAM) 50 MG tablet Take 1 tablet (50 mg total) by mouth every 6 (six) hours as needed. 30 tablet 0  . UNABLE TO FIND Med Name: Closys Mouth Rinse two-three times daily for ulcer.    . vitamin B-12 (CYANOCOBALAMIN) 1000 MCG tablet Take 1,000 mcg by mouth daily.     No current facility-administered medications for this visit.    Facility-Administered Medications Ordered in Other Visits  Medication Dose Route Frequency Provider Last Rate Last Dose  . heparin lock flush 100 unit/mL  500 Units Intravenous Once [name],  MD        REVIEW OF SYSTEMS:   Constitutional: Denies fevers, chills or abnormal night sweats Eyes: Denies blurriness of vision, double vision or watery eyes  Ears, nose, mouth, throat, and face: Denies mucositis or sore throat  Respiratory: Denies cough, dyspnea or wheezes Cardiovascular: Denies palpitation, chest discomfort or lower extremity swelling Gastrointestinal:  Denies heartburn   Urinary:  Denies Dysuria  Skin: Denies abnormal skin rashes Lymphatics: Denies new lymphadenopathy or easy bruising Neurological:Denies numbness, tingling or new weaknesses Behavioral/Psych: Mood is stable, no new changes (+) Dementia, stable  All other systems were reviewed with the patient and are negative.  PHYSICAL EXAMINATION:  ECOG PERFORMANCE STATUS: 1 - Symptomatic but completely ambulatory  Vitals:   09/15/17 1157  BP: (!) 145/91  Pulse: 87  Resp: 18  Temp: 97.7 F (36.5 C)  SpO2: 100%   Filed Weights   09/15/17 1157  Weight: 114 lb 12.8 oz (52.1 kg)     GENERAL:alert, no distress and comfortable SKIN: skin color, texture, turgor are normal, no rashes or significant lesions EYES: normal, conjunctiva are pink and non-injected, sclera clear (+) difficult to evaluate due to her dementia OROPHARYNX:no exudate (+) Upper and lower dentures  (+) slight color change of tongue  NECK: supple, thyroid normal size, non-tender, without nodularity LYMPH:  no  palpable lymphadenopathy in the cervical, axillary or inguinal LUNGS: clear to auscultation and percussion with normal breathing effort HEART: regular rate & rhythm and no murmurs and no lower extremity edema ABDOMEN:abdomen soft, non-tender and normal bowel sounds  Musculoskeletal:no cyanosis of digits and no clubbing  PSYCH: alert and with fluent speech, (+) dementia  NEURO: no focal motor/sensory deficits  LABORATORY DATA:  I have reviewed the data as listed CBC Latest Ref Rng & Units 09/15/2017 09/01/2017 08/18/2017  WBC 3.9 -  10.3 K/uL 6.1 6.5 5.8  Hemoglobin 11.6 - 15.9 g/dL 10.5(L) 10.7(L) 11.5(L)  Hematocrit 34.8 - 46.6 % 32.5(L) 33.3(L) 35.0  Platelets 145 - 400 K/uL 324 339 349    CMP Latest Ref Rng & Units 09/15/2017 09/01/2017 08/18/2017  Glucose 70 - 99 mg/dL 82 98 76  BUN 8 - 23 mg/dL _0 Creatinine 0.44 - 1.00 mg/dL 1.46(H) 1.51(H) 1.15(H)  Sodium 135 - 145 mmol/L 143 141 139  Potassium 3.5 - 5.1 mmol/L 3.8 4.0 4.2  Chloride 98 - 111 mmol/L 110 110 103  CO2 22 - 32 mmol/L _1 Calcium 8.9 - 10.3 mg/dL 8.8(L) 8.7(L) 9.6  Total Protein 6.5 - 8.1 g/dL 6.9 7.0 7.6  Total Bilirubin 0.3 - 1.2 mg/dL 0.3 0.5 0.3  Alkaline Phos 38 - 126 U/L 69 58 86  AST 15 - 41 U/L _2 ALT 0 - 44 U/L _3 CEA   01/23/2015: 5.7 06/05/2015: 6.1 08/04/2015: 10.3 10/12/2015: 13.29 (in-house)  11/04/2015: 12.83 01/13/2016: 8.57 02/24/2016: 8.41 04/27/2016: 9.22 05/20/2016: 8.32 06/17/2016: 6.45 07/15/2016: 5.07 07/29/2016: 6.51 09/09/16: 7.47 10/07/16: 7.85 11/04/16: 7.75 12/02/16: 8.13 01/06/17: 10.36 02/03/17: 12.60 03/03/18: 9.96  03/31/17: 12.7 04/14/17: 11.93 05/12/17: 13.00 06/09/17: 14.97 07/07/17: 15.22 08/04/17: 17.26 09/01/17: 21.94   PATHOLOGY REPORT: Diagnosis 01/23/2015 Colon, segmental resection, with terminal ileum and cecum - INVASIVE ADENOCARCINOMA EXTENDING INTO PERICECAL CONNECTIVE TISSUE AND INVOLVING SMALL INTESTINE. - THREE BENIGN LYMPH NODES (0/3). - SEE ONCOLOGY TABLE BELOW.  Microscopic Comment COLON AND RECTUM (INCLUDING TRANS-ANAL RESECTION): Specimen: Terminal ileum and cecum. Procedure: Resection. Tumor site: Cecum adjacent to appendix. Specimen integrity: Intact. Macroscopic intactness of mesorectum: Not applicable. Macroscopic tumor perforation: No. Invasive tumor: Maximum size: 4.0 cm. Histologic type(s): Colorectal adenocarcinoma. Histologic grade and differentiation: G2: moderately differentiated/low grade. Type of polyp in which invasive carcinoma arose: No  residual polyp. Microscopic extension of invasive tumor: Into pericecal connective tissue and terminal ileum. Lymph-Vascular invasion: Not identified. Peri-neural invasion: Not identified. Tumor deposit(s) (discontinuous extramural extension): No. Resection margins: Proximal margin: Free of tumor. Distal margin: Free of tumor. Circumferential (radial) (posterior ascending, posterior descending; lateral and posterior mid-rectum; and entire lower 1/3 rectum): Free of tumor. Mesenteric margin (sigmoid and transverse): N/A. Distance closest margin (if all above margins negative): N/A. Treatment effect (neo-adjuvant therapy): No. Additional polyp(s): No. Non-neoplastic findings: N/A. Lymph nodes: number examined 3; number positive: 0. Pathologic Staging: pT4b, pN0, pMX. Ancillary studies: Mismatch repair protein by immunohistochemistry pending. Comments: There is a colorectal-type adenocarcinoma arising in the cecum in the area of the appendiceal orifice. The tumor extends into the pericecal connective tissue, and involves the attached segment of terminal ileum. Immunohistochemistry shows the tumor is positive with CDX2 and cytokeratin 20, and negative with cytokeratin 7, estrogen receptor and WT-1 supporting a diagnosis of primary colorectal adenocarcinoma. ADDITIONAL INFORMATION: Mismatch Repair (MMR) Protein Immunohistochemistry (IHC) IHC Expression Result: MLH1: Preserved nuclear expression (greater 50% tumor expression) MSH2: Preserved nuclear expression (greater  50% tumor expression) MSH6: Preserved nuclear expression (greater 50% tumor expression) PMS2: Preserved nuclear expression (greater 50% tumor expression) * Internal control demonstrates intact nuclear expression Interpretation: NORMAL There is preserved expression of the major and minor MMR proteins. There is a very low probability that microsatellite instability (MSI) is present. However, certain clinically significant MMR  protein mutations may result in preservation of nuclear expression. It is recommended that the preservation of protein expression be correlated with molecular based MSI testing.    RADIOGRAPHIC STUDIES: I have personally reviewed the radiological images as listed and agreed with the findings in the report.  PET, 07/03/2017 IMPRESSION: 1. General mild worsening, with increased activity in the hepatic metastatic lesions, mesenteric and omental nodularity, and with new/worsening metastatic nodularity in the right lower quadrant below the cecum. 2. There are over 20 small pulmonary nodules which are below sensitive PET-CT size thresholds and stable from the prior exam, but not currently hypermetabolic. 3. Other imaging findings of potential clinical significance: Aortic Atherosclerosis (ICD10-I70.0) and Emphysema (ICD10-J43.9). Coronary atherosclerosis. Uterine fibroid. Small amount of free pelvic fluid in the cul-de-sac. Old pelvic deformities from prior fractures. Degenerative hip and glenohumeral arthropathy. Spondylosis.  PET 03/13/17 IMPRESSION: 1. Hepatic metastases are stable in size with stable to minimally decreased hypermetabolism. 2. Inferior left rectus abdominus metastasis shows mildly decreased hypermetabolism. 3. Omental nodule no longer shows abnormal hypermetabolism. 4. Scattered pulmonary nodules are stable in size and too small for PET resolution. 5.  Aortic atherosclerosis (ICD10-170.0). 6.  Emphysema (ICD10-J43.9).  PET 11/25/16 IMPRESSION: 1. Interval worsening in the interval. Numerous pulmonary nodules are again seen. While multiple nodules are stable, there are several nodules which are a little larger in the interval. They remain too small to completely characterize with PET-CT. The 2 hepatic metastases demonstrate increased FDG uptake on today's study compared to the previous study. Abdominal wall and peritoneal metastases are mildly worsened.  PET  08/09/2016 IMPRESSION: 1. Overall mild improvement compared to FDG PET scan of 10/28/2015. 2. Pulmonary nodules are increased in size from PET-CT scan and similar size to most recent CT scan. Largest lesion RIGHT lower lobe does have associated faint metabolic activity consistent with pulmonary metastasis. 3. Decrease in metabolic activity of RIGHT hepatic lobe lesion. Potential second lesion LEFT hepatic lobe with slightly lower metabolic activity. 4. Decreased metabolic activity at the enteric colonic anastomosis in the RIGHT lower quadrant. 5. Interval decrease in metabolic activity of peritoneal nodular metastasis. 6. Nodular implant along ventral abdominal wall may be within the musculature, but also improved.  CT CAP w Contrast 05/19/2016 IMPRESSION: 1. Numerous small solid pulmonary nodules, one of which is new, and several of which have mildly increased in size since 10/28/2015, worrisome for mild progression of pulmonary metastases. 2. Solitary right liver lobe metastasis is mildly increased in size since 01/08/2016. 3. Peritoneal metastases have mildly increased in size since 01/08/2016, largest in the right pelvic sidewall. 4. Well-positioned right nephroureteral stent without overt right hydronephrosis. 5. Additional findings include aortic atherosclerosis, 1 vessel coronary atherosclerosis and mild to moderate emphysema.  Ct chest, Abdomen Pelvis Wo Contrast 01/08/2016  IMPRESSION: 1. 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 2. Hepatic and renal cysts. 3.  Aortoiliac atherosclerotic vascular disease. 4. Lumbar spondylosis and degenerative disc disease with multilevel impingement.  ASSESSMENT & PLAN: 77 y.o. African-American female, with past medical history of moderate dementia, independent with ADLs, hypertension, presents with small bowel obstruction and weight loss.  1. 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 -I personally reviewed her recent PET scan images, unfortunately the scan showed hypermetabolic peritoneal and liver metastases. 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 The tumor has MSI stable, she is not a candidate for immunotherapy, although she may be a candidate for clinical trial of immunotherapy plus MEK inhibitor at [facility] -She started Xeloda 1536m bid, 2 weeks on and one week off on 11/11/15 and stopped 05/20/16 due to disease progression.  -I previously reviewed her restaging CT scan from 01/08/2016, which showed overall stable disease, no new lesions.  -I previously reviewed the CT from 05/19/2016 with the patient and her daughter in detail. She has had a moderate disease progression in lungs, liver and peritoneum. I recommended her to change treatment. -She started second line single agent irinotecan on 06/03/16 at a reduced dose 1540mm2, tolerated well. - PET Scan image from 08/09/16 showed good partial response to treatment. We will continue irinotecan and Avastin. -We previously discussed her 11/25/16 PET scan. She has small increase in size of some of her small lung nodules, and mild increased FDG of her liver mets, but overall stable disease. I think the benefit of current treatment is getting smaller. We may need to change treatment in 2 months after a  repeat scan. Will continue Irinotecan and Avastin for now. She agrees.   -PET scan on 03/13/17 revealed that hepatic metastases are stable in size with stable to minimally decreased hypermetabolism, the inferior left rectus abdominus metastasis shows mildly decreased hypermetabolism, the omental nodule no longer shows abnormal hypermetabolism. Scattered pulmonary nodules are stable in size and too small for PET resolution. 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 Previously discussed with pt and daughter. They are very pleased. -Her urine proteins were previously elevated at above 300 and Avastin has been held intermittently since 03/17/17 if urine protein >=300.  -PET Scan 07/03/2017 reviewed mild disease progression, with mainly increased FDG uptake, the overall metastatic lesion sizes have not changed significantly.  -If she has further disease progression or she experiences side effects from chemo, I will stop chemo treatment. I do not recommend third line chemo, due to the low benefit and her medical conditions especially dementia. -Labs reviewed, Hg at 10.5, no need for blood transfusion, Cr improved to 1.46, Ca at 8.8, Urine Protein at 100, overall adequate to proceed with Irinotecan and Avastin today d -Will do PET scan before next visit.  -I discussed she is tolerating treatment well and has little symptoms from cancer. She is able to take a chemobreak when needed. Pt will let me know.  -F/u in 2 weeks    2. CKD stage III, right hydroureter  -A previous CT scan showed right hydroureter, which probably contributes to her renal insufficiency. -she had ureter stent placement again in 12/2015, 03/2016, 04/2016, and 04/2017 -Cr improved to 1.46 today (09/15/17)   3. HTN, dementia   -She will  continue her medication and follow-up with her primary care physician. -Her BP has been better controlled lately, we previously discussed that Avastin can cause hypertension, we'll continue monitoring  closely. -Her dementia has got worse gradually, which has increased her care burden on her daughter. I previously discussed burn-out prevention with her daughter, she appreciated and now doing better with home assistance for her mother.  -Dementia better on Seroquel, continue    4. Goal of care discussion  -We previously discussed the incurable nature of her cancer, and the overall poor prognosis, especially if she has progress on chemo -The patient understands the goal of care is palliative. -Due to her advanced age and comorbidities, especially dementia, I have low threshold to switch her to palliative care alone if she does not tolerate chemo well or dementia gets worse, her daughter is on -board  -I recommend DNR/DNI, pt and her daughter agrees.  -She has a caregiver and is under advanced home care. They plan to continue with chemotherapy as long as she can tolerate it.   5. Insomnia  -I first suggested melatonin over-the counter and to check with her neurologist. If that does not help she can use prescription medication.  -Suggested she increase benadryl to 1.5-2 tabs as needed. She will try white noise machine for non-pharm intervention. -Much better sleeping on Seroquel    6. Social support -Due to her dementia and chemotherapy, she requires 24/7 care at home. Her daughter is her only caregiver. I previously sent referrals to aid in getting pt home assistance.  -She started advanced homecare on 08/07/17. She was discharged from her home care nurse and her PT finished recently. Her Speech therapy is going well.   -She has a care giver Pamala Hurry who comes in 2-3 days/week   Plan -I refilled Mirtazapine today  -Patient is clinically doing well, labs reviewed, adequate for treatment, will proceed Irinotecan and Avastin today   -PET scan on 7/22 -f/u in 2 weeks    All questions were answered. The patient knows to call the clinic with any problems, questions or concerns.  I spent 20  minutes counseling the patient face to face. The total time spent in the appointment was 25 minutes and more than 50% was on counseling.  Oneal Deputy, am acting as scribe for [name], MD.   I have reviewed the above documentation for accuracy and completeness, and I agree with the above.    [name]  09/15/2017

## 2017-09-15 ENCOUNTER — Encounter: Payer: Self-pay | Admitting: Hematology

## 2017-09-15 ENCOUNTER — Inpatient Hospital Stay: Payer: Medicare Other | Attending: Hematology

## 2017-09-15 ENCOUNTER — Inpatient Hospital Stay: Payer: Medicare Other

## 2017-09-15 ENCOUNTER — Telehealth: Payer: Self-pay | Admitting: Hematology

## 2017-09-15 ENCOUNTER — Inpatient Hospital Stay (HOSPITAL_BASED_OUTPATIENT_CLINIC_OR_DEPARTMENT_OTHER): Payer: Medicare Other | Admitting: Hematology

## 2017-09-15 ENCOUNTER — Other Ambulatory Visit: Payer: Self-pay | Admitting: Neurology

## 2017-09-15 VITALS — BP 145/91 | HR 87 | Temp 97.7°F | Resp 18 | Ht 65.0 in | Wt 114.8 lb

## 2017-09-15 VITALS — BP 148/66 | HR 90 | Temp 98.4°F | Resp 18

## 2017-09-15 DIAGNOSIS — R5383 Other fatigue: Secondary | ICD-10-CM | POA: Diagnosis not present

## 2017-09-15 DIAGNOSIS — C182 Malignant neoplasm of ascending colon: Secondary | ICD-10-CM

## 2017-09-15 DIAGNOSIS — F0390 Unspecified dementia without behavioral disturbance: Secondary | ICD-10-CM | POA: Diagnosis not present

## 2017-09-15 DIAGNOSIS — D638 Anemia in other chronic diseases classified elsewhere: Secondary | ICD-10-CM

## 2017-09-15 DIAGNOSIS — I129 Hypertensive chronic kidney disease with stage 1 through stage 4 chronic kidney disease, or unspecified chronic kidney disease: Secondary | ICD-10-CM | POA: Diagnosis not present

## 2017-09-15 DIAGNOSIS — C787 Secondary malignant neoplasm of liver and intrahepatic bile duct: Secondary | ICD-10-CM

## 2017-09-15 DIAGNOSIS — N183 Chronic kidney disease, stage 3 (moderate): Secondary | ICD-10-CM | POA: Insufficient documentation

## 2017-09-15 DIAGNOSIS — R634 Abnormal weight loss: Secondary | ICD-10-CM | POA: Insufficient documentation

## 2017-09-15 DIAGNOSIS — G4700 Insomnia, unspecified: Secondary | ICD-10-CM | POA: Insufficient documentation

## 2017-09-15 DIAGNOSIS — M1990 Unspecified osteoarthritis, unspecified site: Secondary | ICD-10-CM

## 2017-09-15 DIAGNOSIS — I700 Atherosclerosis of aorta: Secondary | ICD-10-CM | POA: Insufficient documentation

## 2017-09-15 DIAGNOSIS — Z5112 Encounter for antineoplastic immunotherapy: Secondary | ICD-10-CM | POA: Insufficient documentation

## 2017-09-15 DIAGNOSIS — Z7982 Long term (current) use of aspirin: Secondary | ICD-10-CM | POA: Insufficient documentation

## 2017-09-15 DIAGNOSIS — Z79899 Other long term (current) drug therapy: Secondary | ICD-10-CM

## 2017-09-15 DIAGNOSIS — C7800 Secondary malignant neoplasm of unspecified lung: Secondary | ICD-10-CM | POA: Diagnosis not present

## 2017-09-15 DIAGNOSIS — C786 Secondary malignant neoplasm of retroperitoneum and peritoneum: Secondary | ICD-10-CM

## 2017-09-15 DIAGNOSIS — Z5111 Encounter for antineoplastic chemotherapy: Secondary | ICD-10-CM | POA: Insufficient documentation

## 2017-09-15 DIAGNOSIS — K219 Gastro-esophageal reflux disease without esophagitis: Secondary | ICD-10-CM | POA: Diagnosis not present

## 2017-09-15 DIAGNOSIS — J439 Emphysema, unspecified: Secondary | ICD-10-CM | POA: Diagnosis not present

## 2017-09-15 DIAGNOSIS — I10 Essential (primary) hypertension: Secondary | ICD-10-CM

## 2017-09-15 DIAGNOSIS — E785 Hyperlipidemia, unspecified: Secondary | ICD-10-CM

## 2017-09-15 LAB — COMPREHENSIVE METABOLIC PANEL
ALK PHOS: 69 U/L (ref 38–126)
ALT: 10 U/L (ref 0–44)
AST: 16 U/L (ref 15–41)
Albumin: 3.4 g/dL — ABNORMAL LOW (ref 3.5–5.0)
Anion gap: 9 (ref 5–15)
BILIRUBIN TOTAL: 0.3 mg/dL (ref 0.3–1.2)
BUN: 19 mg/dL (ref 8–23)
CALCIUM: 8.8 mg/dL — AB (ref 8.9–10.3)
CO2: 24 mmol/L (ref 22–32)
CREATININE: 1.46 mg/dL — AB (ref 0.44–1.00)
Chloride: 110 mmol/L (ref 98–111)
GFR calc Af Amer: 39 mL/min — ABNORMAL LOW (ref >60)
GFR calc non Af Amer: 34 mL/min — ABNORMAL LOW (ref >60)
GLUCOSE: 82 mg/dL (ref 70–99)
POTASSIUM: 3.8 mmol/L (ref 3.5–5.1)
Sodium: 143 mmol/L (ref 135–145)
TOTAL PROTEIN: 6.9 g/dL (ref 6.5–8.1)

## 2017-09-15 LAB — CBC WITH DIFFERENTIAL/PLATELET
BASOS ABS: 0.1 10*3/uL (ref 0.0–0.1)
Basophils Relative: 1 %
Eosinophils Absolute: 0.2 10*3/uL (ref 0.0–0.5)
Eosinophils Relative: 3 %
HEMATOCRIT: 32.5 % — AB (ref 34.8–46.6)
HEMOGLOBIN: 10.5 g/dL — AB (ref 11.6–15.9)
LYMPHS PCT: 17 %
Lymphs Abs: 1.1 10*3/uL (ref 0.9–3.3)
MCH: 29 pg (ref 25.1–34.0)
MCHC: 32.4 g/dL (ref 31.5–36.0)
MCV: 89.7 fL (ref 79.5–101.0)
Monocytes Absolute: 0.6 10*3/uL (ref 0.1–0.9)
Monocytes Relative: 9 %
NEUTROS ABS: 4.2 10*3/uL (ref 1.5–6.5)
NEUTROS PCT: 70 %
Platelets: 324 10*3/uL (ref 145–400)
RBC: 3.63 MIL/uL — AB (ref 3.70–5.45)
RDW: 19.7 % — ABNORMAL HIGH (ref 11.2–14.5)
WBC: 6.1 10*3/uL (ref 3.9–10.3)

## 2017-09-15 LAB — TOTAL PROTEIN, URINE DIPSTICK: Protein, ur: 100 mg/dL — AB

## 2017-09-15 MED ORDER — ATROPINE SULFATE 1 MG/ML IJ SOLN
INTRAMUSCULAR | Status: AC
  Filled 2017-09-15: qty 1

## 2017-09-15 MED ORDER — SODIUM CHLORIDE 0.9 % IV SOLN
5.0000 mg/kg | Freq: Once | INTRAVENOUS | Status: AC
  Administered 2017-09-15: 275 mg via INTRAVENOUS
  Filled 2017-09-15: qty 11

## 2017-09-15 MED ORDER — SODIUM CHLORIDE 0.9 % IV SOLN
Freq: Once | INTRAVENOUS | Status: AC
  Administered 2017-09-15: 13:00:00 via INTRAVENOUS

## 2017-09-15 MED ORDER — PALONOSETRON HCL INJECTION 0.25 MG/5ML
0.2500 mg | Freq: Once | INTRAVENOUS | Status: AC
  Administered 2017-09-15: 0.25 mg via INTRAVENOUS

## 2017-09-15 MED ORDER — HEPARIN SOD (PORK) LOCK FLUSH 100 UNIT/ML IV SOLN
500.0000 [IU] | Freq: Once | INTRAVENOUS | Status: AC | PRN
  Administered 2017-09-15: 500 [IU]
  Filled 2017-09-15: qty 5

## 2017-09-15 MED ORDER — PALONOSETRON HCL INJECTION 0.25 MG/5ML
INTRAVENOUS | Status: AC
  Filled 2017-09-15: qty 5

## 2017-09-15 MED ORDER — ATROPINE SULFATE 1 MG/ML IJ SOLN
0.5000 mg | Freq: Once | INTRAMUSCULAR | Status: AC | PRN
  Administered 2017-09-15: 0.5 mg via INTRAVENOUS

## 2017-09-15 MED ORDER — SODIUM CHLORIDE 0.9% FLUSH
10.0000 mL | INTRAVENOUS | Status: DC | PRN
  Administered 2017-09-15: 10 mL
  Filled 2017-09-15: qty 10

## 2017-09-15 MED ORDER — IRINOTECAN HCL CHEMO INJECTION 100 MG/5ML
150.0000 mg/m2 | Freq: Once | INTRAVENOUS | Status: AC
  Administered 2017-09-15: 240 mg via INTRAVENOUS
  Filled 2017-09-15: qty 12

## 2017-09-15 MED ORDER — SODIUM CHLORIDE 0.9% FLUSH
10.0000 mL | Freq: Once | INTRAVENOUS | Status: AC
  Administered 2017-09-15: 10 mL
  Filled 2017-09-15: qty 10

## 2017-09-15 MED ORDER — DEXAMETHASONE SODIUM PHOSPHATE 10 MG/ML IJ SOLN
INTRAMUSCULAR | Status: AC
  Filled 2017-09-15: qty 1

## 2017-09-15 MED ORDER — DEXAMETHASONE SODIUM PHOSPHATE 10 MG/ML IJ SOLN
10.0000 mg | Freq: Once | INTRAMUSCULAR | Status: AC
  Administered 2017-09-15: 10 mg via INTRAVENOUS

## 2017-09-15 MED FILL — MIRTAZAPINE 15 MG TABLET: 15 | 30 days supply | Qty: 30 | Fill #0

## 2017-09-15 MED FILL — QUETIAPINE 25 MG TABLET: 25 | 30 days supply | Qty: 30 | Fill #1

## 2017-09-15 NOTE — Telephone Encounter (Signed)
Gave pt avs and calendar with appts per 7/12 los.

## 2017-09-15 NOTE — Patient Instructions (Signed)
[facility] Discharge Instructions for Patients Receiving Chemotherapy  Today you received the following chemotherapy agents  Irinotecan and Avastin  To help prevent nausea and vomiting after your treatment, we encourage you to take your nausea medication as directed   If you develop nausea and vomiting that is not controlled by your nausea medication, call the clinic.   BELOW ARE SYMPTOMS THAT SHOULD BE REPORTED IMMEDIATELY:  *FEVER GREATER THAN 100.5 F  *CHILLS WITH OR WITHOUT FEVER  NAUSEA AND VOMITING THAT IS NOT CONTROLLED WITH YOUR NAUSEA MEDICATION  *UNUSUAL SHORTNESS OF BREATH  *UNUSUAL BRUISING OR BLEEDING  TENDERNESS IN MOUTH AND THROAT WITH OR WITHOUT PRESENCE OF ULCERS  *URINARY PROBLEMS  *BOWEL PROBLEMS  UNUSUAL RASH Items with * indicate a potential emergency and should be followed up as soon as possible.  Feel free to call the clinic should you have any questions or concerns. The clinic phone number is [phone].  Please show the CHEMO ALERT CARD at check-in to the Emergency Department and triage nurse.   

## 2017-09-18 MED FILL — MYRBETRIQ ER 50 MG TABLET: 50 | 30 days supply | Qty: 30 | Fill #5

## 2017-09-27 ENCOUNTER — Encounter (HOSPITAL_COMMUNITY)
Admission: RE | Admit: 2017-09-27 | Discharge: 2017-09-27 | Disposition: A | Discharge status: Home / Self Care | Payer: Medicare Other | Source: Ambulatory Visit | Attending: Hematology | Admitting: Hematology

## 2017-09-27 DIAGNOSIS — C182 Malignant neoplasm of ascending colon: Secondary | ICD-10-CM | POA: Insufficient documentation

## 2017-09-27 LAB — GLUCOSE, CAPILLARY: GLUCOSE-CAPILLARY: 77 mg/dL (ref 70–99)

## 2017-09-27 MED ORDER — FLUDEOXYGLUCOSE F - 18 (FDG) INJECTION
5.6000 | Freq: Once | INTRAVENOUS | Status: AC | PRN
  Administered 2017-09-27: 5.6 via INTRAVENOUS

## 2017-09-29 ENCOUNTER — Other Ambulatory Visit: Payer: Self-pay | Admitting: *Deleted

## 2017-09-29 ENCOUNTER — Inpatient Hospital Stay: Payer: Medicare Other

## 2017-09-29 ENCOUNTER — Encounter: Payer: Self-pay | Admitting: Nurse Practitioner

## 2017-09-29 ENCOUNTER — Inpatient Hospital Stay (HOSPITAL_BASED_OUTPATIENT_CLINIC_OR_DEPARTMENT_OTHER): Payer: Medicare Other | Admitting: Nurse Practitioner

## 2017-09-29 ENCOUNTER — Telehealth: Payer: Self-pay | Admitting: Hematology

## 2017-09-29 VITALS — BP 160/80 | HR 86 | Temp 97.7°F | Resp 18 | Ht 65.0 in | Wt 112.7 lb

## 2017-09-29 DIAGNOSIS — E785 Hyperlipidemia, unspecified: Secondary | ICD-10-CM

## 2017-09-29 DIAGNOSIS — N183 Chronic kidney disease, stage 3 (moderate): Secondary | ICD-10-CM

## 2017-09-29 DIAGNOSIS — I129 Hypertensive chronic kidney disease with stage 1 through stage 4 chronic kidney disease, or unspecified chronic kidney disease: Secondary | ICD-10-CM

## 2017-09-29 DIAGNOSIS — C182 Malignant neoplasm of ascending colon: Secondary | ICD-10-CM

## 2017-09-29 DIAGNOSIS — J439 Emphysema, unspecified: Secondary | ICD-10-CM

## 2017-09-29 DIAGNOSIS — I700 Atherosclerosis of aorta: Secondary | ICD-10-CM

## 2017-09-29 DIAGNOSIS — M1990 Unspecified osteoarthritis, unspecified site: Secondary | ICD-10-CM

## 2017-09-29 DIAGNOSIS — K219 Gastro-esophageal reflux disease without esophagitis: Secondary | ICD-10-CM

## 2017-09-29 DIAGNOSIS — C787 Secondary malignant neoplasm of liver and intrahepatic bile duct: Secondary | ICD-10-CM

## 2017-09-29 DIAGNOSIS — Z7982 Long term (current) use of aspirin: Secondary | ICD-10-CM

## 2017-09-29 DIAGNOSIS — R634 Abnormal weight loss: Secondary | ICD-10-CM

## 2017-09-29 DIAGNOSIS — Z79899 Other long term (current) drug therapy: Secondary | ICD-10-CM

## 2017-09-29 DIAGNOSIS — C786 Secondary malignant neoplasm of retroperitoneum and peritoneum: Secondary | ICD-10-CM | POA: Diagnosis not present

## 2017-09-29 DIAGNOSIS — R5383 Other fatigue: Secondary | ICD-10-CM

## 2017-09-29 DIAGNOSIS — C7800 Secondary malignant neoplasm of unspecified lung: Secondary | ICD-10-CM | POA: Diagnosis not present

## 2017-09-29 DIAGNOSIS — F0390 Unspecified dementia without behavioral disturbance: Secondary | ICD-10-CM

## 2017-09-29 DIAGNOSIS — G4700 Insomnia, unspecified: Secondary | ICD-10-CM

## 2017-09-29 LAB — CBC WITH DIFFERENTIAL/PLATELET
BASOS PCT: 1 %
Basophils Absolute: 0 10*3/uL (ref 0.0–0.1)
EOS ABS: 0.2 10*3/uL (ref 0.0–0.5)
EOS PCT: 2 %
HCT: 33.5 % — ABNORMAL LOW (ref 34.8–46.6)
HEMOGLOBIN: 10.6 g/dL — AB (ref 11.6–15.9)
Lymphocytes Relative: 16 %
Lymphs Abs: 1.3 10*3/uL (ref 0.9–3.3)
MCH: 28.4 pg (ref 25.1–34.0)
MCHC: 31.6 g/dL (ref 31.5–36.0)
MCV: 89.8 fL (ref 79.5–101.0)
MONOS PCT: 9 %
Monocytes Absolute: 0.8 10*3/uL (ref 0.1–0.9)
NEUTROS ABS: 5.8 10*3/uL (ref 1.5–6.5)
NEUTROS PCT: 72 %
PLATELETS: 337 10*3/uL (ref 145–400)
RBC: 3.73 MIL/uL (ref 3.70–5.45)
RDW: 18.2 % — ABNORMAL HIGH (ref 11.2–14.5)
WBC: 8 10*3/uL (ref 3.9–10.3)

## 2017-09-29 LAB — COMPREHENSIVE METABOLIC PANEL
ALBUMIN: 3.6 g/dL (ref 3.5–5.0)
ALT: 11 U/L (ref 0–44)
AST: 19 U/L (ref 15–41)
Alkaline Phosphatase: 76 U/L (ref 38–126)
Anion gap: 7 (ref 5–15)
BUN: 13 mg/dL (ref 8–23)
CHLORIDE: 104 mmol/L (ref 98–111)
CO2: 25 mmol/L (ref 22–32)
CREATININE: 1.16 mg/dL — AB (ref 0.44–1.00)
Calcium: 8.8 mg/dL — ABNORMAL LOW (ref 8.9–10.3)
GFR calc non Af Amer: 45 mL/min — ABNORMAL LOW (ref >60)
GFR, EST AFRICAN AMERICAN: 52 mL/min — AB (ref >60)
GLUCOSE: 90 mg/dL (ref 70–99)
Potassium: 4.1 mmol/L (ref 3.5–5.1)
SODIUM: 136 mmol/L (ref 135–145)
Total Bilirubin: 0.3 mg/dL (ref 0.3–1.2)
Total Protein: 7 g/dL (ref 6.5–8.1)

## 2017-09-29 LAB — CEA (IN HOUSE-CHCC): CEA (CHCC-In House): 25.89 ng/mL — ABNORMAL HIGH (ref 0.00–5.00)

## 2017-09-29 LAB — TOTAL PROTEIN, URINE DIPSTICK: PROTEIN: 30 mg/dL — AB

## 2017-09-29 MED ORDER — DEXAMETHASONE SODIUM PHOSPHATE 10 MG/ML IJ SOLN
INTRAMUSCULAR | Status: AC
  Filled 2017-09-29: qty 1

## 2017-09-29 MED ORDER — SODIUM CHLORIDE 0.9% FLUSH
10.0000 mL | INTRAVENOUS | Status: DC | PRN
  Administered 2017-09-29: 10 mL
  Filled 2017-09-29: qty 10

## 2017-09-29 MED ORDER — ATROPINE SULFATE 1 MG/ML IJ SOLN
INTRAMUSCULAR | Status: AC
  Filled 2017-09-29: qty 1

## 2017-09-29 MED ORDER — PALONOSETRON HCL INJECTION 0.25 MG/5ML
INTRAVENOUS | Status: AC
  Filled 2017-09-29: qty 5

## 2017-09-29 MED ORDER — DEXAMETHASONE SODIUM PHOSPHATE 10 MG/ML IJ SOLN
10.0000 mg | Freq: Once | INTRAMUSCULAR | Status: AC
  Administered 2017-09-29: 10 mg via INTRAVENOUS

## 2017-09-29 MED ORDER — IRINOTECAN HCL CHEMO INJECTION 100 MG/5ML
150.0000 mg/m2 | Freq: Once | INTRAVENOUS | Status: AC
  Administered 2017-09-29: 240 mg via INTRAVENOUS
  Filled 2017-09-29: qty 12

## 2017-09-29 MED ORDER — ATROPINE SULFATE 1 MG/ML IJ SOLN
0.5000 mg | Freq: Once | INTRAMUSCULAR | Status: AC | PRN
  Administered 2017-09-29: 0.5 mg via INTRAVENOUS

## 2017-09-29 MED ORDER — SODIUM CHLORIDE 0.9 % IV SOLN
Freq: Once | INTRAVENOUS | Status: AC
  Administered 2017-09-29: 11:00:00 via INTRAVENOUS
  Filled 2017-09-29: qty 250

## 2017-09-29 MED ORDER — SODIUM CHLORIDE 0.9 % IV SOLN
5.0000 mg/kg | Freq: Once | INTRAVENOUS | Status: AC
  Administered 2017-09-29: 275 mg via INTRAVENOUS
  Filled 2017-09-29: qty 11

## 2017-09-29 MED ORDER — SODIUM CHLORIDE 0.9% FLUSH
10.0000 mL | Freq: Once | INTRAVENOUS | Status: AC
  Administered 2017-09-29: 10 mL
  Filled 2017-09-29: qty 10

## 2017-09-29 MED ORDER — HEPARIN SOD (PORK) LOCK FLUSH 100 UNIT/ML IV SOLN
500.0000 [IU] | Freq: Once | INTRAVENOUS | Status: AC | PRN
  Administered 2017-09-29: 500 [IU]
  Filled 2017-09-29: qty 5

## 2017-09-29 MED ORDER — PALONOSETRON HCL INJECTION 0.25 MG/5ML
0.2500 mg | Freq: Once | INTRAVENOUS | Status: AC
  Administered 2017-09-29: 0.25 mg via INTRAVENOUS

## 2017-09-29 NOTE — Telephone Encounter (Signed)
No LOS 7/26 °

## 2017-09-29 NOTE — Progress Notes (Signed)
Regan Rakers, NP saw pt today prior to chemo.  OK to treat with Urine protein of 30 as per NP.

## 2017-09-29 NOTE — Patient Instructions (Signed)
[facility] Discharge Instructions for Patients Receiving Chemotherapy  Today you received the following chemotherapy agents :  Avastin,  Irinotecan.  To help prevent nausea and vomiting after your treatment, we encourage you to take your nausea medication as prescribed.   If you develop nausea and vomiting that is not controlled by your nausea medication, call the clinic.   BELOW ARE SYMPTOMS THAT SHOULD BE REPORTED IMMEDIATELY:  *FEVER GREATER THAN 100.5 F  *CHILLS WITH OR WITHOUT FEVER  NAUSEA AND VOMITING THAT IS NOT CONTROLLED WITH YOUR NAUSEA MEDICATION  *UNUSUAL SHORTNESS OF BREATH  *UNUSUAL BRUISING OR BLEEDING  TENDERNESS IN MOUTH AND THROAT WITH OR WITHOUT PRESENCE OF ULCERS  *URINARY PROBLEMS  *BOWEL PROBLEMS  UNUSUAL RASH Items with * indicate a potential emergency and should be followed up as soon as possible.  Feel free to call the clinic should you have any questions or concerns. The clinic phone number is [phone].  Please show the [facility] at check-in to the Emergency Department and triage nurse.

## 2017-09-29 NOTE — Progress Notes (Signed)
[place]  [phone] Fax:(336) [phone]  Clinic Follow up Note   Patient Care Team: [name], MD as PCP - General (Family Medicine) [name], MD as Consulting Physician (Medical Oncology) [name], MD as Consulting Physician (General Surgery) [name], MD as Consulting Physician (Orthopedic Surgery) [name] [name], MD as Consulting Physician (Urology) 09/29/2017  SUMMARY OF ONCOLOGIC HISTORY: Oncology History   Cancer of right colon [facility])   Staging form: Colon and Rectum, AJCC 7th Edition     Pathologic stage from 01/23/2015: Stage IIC (T4b, N0, cM0) - Signed by [name], MD on 02/05/2015       Cancer of right colon ([place])   01/21/2015 Imaging     CT abdomen and pelvis showed high-grade small bowel obstruction,  right I [place] ureteral nephrosis,  multiple liver cysts.  CT chest was negative for metastatic lesion.      01/23/2015 Pathology Results     invasive adenocarcinoma extending into the pericecal connective tissue,  grade 2, LVI (-),  no perineural invasion, 3 lymph nodes were negative.      01/23/2015 Surgery      terminal ileum and cecum seegmental resection with anastomosis by Dr. Hassell Done      01/23/2015 Initial Diagnosis    Cancer of right colon ([place])      03/13/2015 - 07/06/2015 Chemotherapy    Xeloda 1500 mg bid X 14 days on-7 off, s/p 5 cycles, stopped early due to wrosening renal function      10/28/2015 Progression    PET scan showed multiple hypermetabolic peritoneal nodules, right ovarian cystic mass, and hypermetabolic liver lesion, highly suspicious for metastatic colon cancer. Small pulmonary nodules are indeterminate.       11/11/2015 - 05/20/2016 Chemotherapy    Xeloda '1500mg'$  bid, 2 weeks on and one week off, Avastin was added on from cycle 3. Xeloda held from 05/20/16 due to progression on CT from 05/19/16 and total bilirubin increased to 2.6.      01/08/2016 Imaging    Ct chest, Abdomen Pelvis  Wo Contrast 01/08/2016  IMPRESSION: 1. 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 2. Hepatic and renal cysts. 3.  Aortoiliac atherosclerotic vascular disease. 4. Lumbar spondylosis and degenerative disc disease with multilevel Impingement.      05/19/2016 Imaging    CT CAP w Contrast IMPRESSION: 1. Numerous small solid pulmonary nodules, one of which is new, and several of which have mildly increased in size since 10/28/2015, worrisome for mild progression of pulmonary metastases. 2. Solitary right liver lobe metastasis is mildly increased in size since 01/08/2016. 3. Peritoneal metastases have mildly increased in size since 01/08/2016, largest in the right pelvic sidewall. 4. Well-positioned right nephroureteral stent without overt right hydronephrosis. 5. Additional findings include aortic atherosclerosis, 1 vessel coronary atherosclerosis and mild to moderate emphysema.      06/03/2016 -  Chemotherapy    Second-Line Irinotecan and Avastin every 2 weeks starting 06/03/16      07/29/2016 Tumor Marker    CEA 6.51       08/09/2016 Imaging    PET  IMPRESSION: 1. Overall mild improvement compared to FDG PET scan of 10/28/2015. 2. Pulmonary nodules are increased in size from PET-CT scan and similar size to most recent CT scan. Largest lesion RIGHT lower lobe does have associated faint metabolic activity consistent with pulmonary metastasis. 3. Decrease in metabolic activity of RIGHT hepatic lobe lesion. Potential second lesion LEFT hepatic lobe with  slightly lower metabolic activity. 4. Decreased metabolic activity at the enteric colonic anastomosis in the RIGHT lower quadrant. 5. Interval decrease in metabolic activity of peritoneal nodular metastasis. 6. Nodular implant implant along ventral abdominal wall may be within the musculature, but also improved.      09/09/2016 Tumor Marker    CEA: 7.47      10/07/2016  Tumor Marker    CEA: 7.85      11/04/2016 Tumor Marker    CEA: 7.75      11/25/2016 PET scan    PET 11/25/16 IMPRESSION: 1. Interval worsening in the interval. Numerous pulmonary nodules are again seen. While multiple nodules are stable, there are several nodules which are a little larger in the interval. They remain too small to completely characterize with PET-CT. The 2 hepatic metastases demonstrate increased FDG uptake on today's study compared to the previous study. Abdominal wall and peritoneal metastases are mildly worsened.      12/02/2016 Tumor Marker    CEA: 8.13      01/06/2017 Tumor Marker    CEA: 10.36      02/03/2017 Tumor Marker    CEA: 12.60      03/03/2017 Tumor Marker    CEA: 9.96      03/13/2017 PET scan    IMPRESSION: 1. Hepatic metastases are stable in size with stable to minimally decreased hypermetabolism. 2. Inferior left rectus abdominus metastasis shows mildly decreased hypermetabolism. 3. Omental nodule no longer shows abnormal hypermetabolism. 4. Scattered pulmonary nodules are stable in size and too small for PET resolution. 5.  Aortic atherosclerosis (ICD10-170.0). 6.  Emphysema (ICD10-J43.9).      07/03/2017 Imaging    PET, 07/03/2017 IMPRESSION: 1. General mild worsening, with increased activity in the hepatic metastatic lesions, mesenteric and omental nodularity, and with new/worsening metastatic nodularity in the right lower quadrant below the cecum. 2. There are over 20 small pulmonary nodules which are below sensitive PET-CT size thresholds and stable from the prior exam, but not currently hypermetabolic. 3. Other imaging findings of potential clinical significance: Aortic Atherosclerosis (ICD10-I70.0) and Emphysema (ICD10-J43.9). Coronary atherosclerosis. Uterine fibroid. Small amount of free pelvic fluid in the cul-de-sac. Old pelvic deformities from prior fractures. Degenerative hip and glenohumeral arthropathy. Spondylosis.       09/27/2017 PET scan    IMPRESSION: 1. Relatively stable exam with no clear trend towards worsening disease or generalized improvement. 2. Scattered tiny bilateral pulmonary nodules without substantial interval change. 3. Index hypermetabolic lesions in the liver, omentum, right lower quadrant mesentery, and anterior abdominal wall show similar hypermetabolism to the prior study. 4.  Aortic Atherosclerois (ICD10-170.0) 5.  Emphysema. (EGB15-V76.9)       CURRENT THERAPY: Second-Line Irinotecan and Avastin every 2 weeks started 06/03/16, Avastin held as needed due to proteinuria  INTERVAL HISTORY: Ms. Beth Wilkins returns for follow-up and next cycle Irinotecan/Avastin as scheduled. She completed cycle 34 on 09/15/17. She has no significant changes. She is fatigued for first 3 days after treatment then "perks up." On day 3 she develops soft stool that lasts 2-3 days. Fiber is added to her diet. No blood in stool, nausea or vomiting. She has some numbness/tingling in her feet in the morning, resolves with movement. She ambulates with a cane while at [facility] but otherwise does not use device at home. No recent fever, chills, cough, chest pain, or dyspnea.   REVIEW OF SYSTEMS:   Constitutional: Denies fevers, chills or abnormal weight loss (+) fatigue x3 days after chemo Eyes: Denies blurriness  of vision Ears, nose, mouth, throat, and face: Denies mucositis or sore throat Respiratory: Denies cough, dyspnea or wheezes Cardiovascular: Denies palpitation, chest discomfort or lower extremity swelling Gastrointestinal:  Denies nausea, vomiting, constipation, diarrhea, hematochezia, heartburn or change in bowel habits (+) loose BM x2-3 days beginning on day 3 after iron OT can Skin: Denies abnormal skin rashes Lymphatics: Denies new lymphadenopathy or easy bruising Neurological:Denies new weaknesses (+) numbness/tingling in feet in the morning, resolves with movement Behavioral/Psych: Mood is stable, no new  changes  All other systems were reviewed with the patient and are negative.  MEDICAL HISTORY:  Past Medical History:  Diagnosis Date  . Anxiety   . Aortic atherosclerosis ([place]) 03/15/2017   noted on PET   . Arthritis   . Bilateral renal cysts   . Centrilobular emphysema ([place]) per 05-10-16 chest xray  . CKD (chronic kidney disease), stage III ([place])   . Colon cancer [facility]) oncologist-  dr [name]   dx 01-23-2015  right colon Invasive adenocarcinoma into pericecal connective tissue and involving small intestine , Stage IIc, Grade 2, LVI(-),  (pT4b N0 M0) /  s/p resection termial ileum and cecum and chemotherapy 01-06-217 to 07-06-2015/   08/ 2017  per PET  recurrent w/ liver mets and peritoneal carcinomatosis  . Complication of anesthesia    can be combative due to dementia  . Full dentures   . Headache    occ stress related  . History of chemotherapy    every other friday currently receiving  . History of gastroesophageal reflux (GERD)    changes diet  . History of pelvic fracture 2015  . Hyperlipidemia   . Hypertension   . Liver metastasis ([place])   . 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 N&V (nausea and vomiting)   . Pulmonary nodules   . Right ovarian cyst   . SBO (small bowel obstruction) ([place]) 01/2015  . 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 UTI (urinary tract infection) 09/26/2016   5 day antibiotic treatment    SURGICAL HISTORY: Past Surgical History:  Procedure Laterality Date  . CATARACT EXTRACTION W/ INTRAOCULAR LENS  IMPLANT, BILATERAL  2005 approx  . CYSTOSCOPY W/ URETERAL STENT PLACEMENT Right 12/03/2015   Procedure: CYSTOSCOPY WITH RETROGRADE PYELOGRAM/URETERAL STENT PLACEMENT;  Surgeon: Cleon Gustin, MD;  Location: [facility];  Service: Urology;   Laterality: Right;  . CYSTOSCOPY W/ URETERAL STENT PLACEMENT Right 02/15/2016   Procedure: CYSTOSCOPY WITH RETROGRADE PYELOGRAM/URETERAL STENT REPLACEMENT;  Surgeon: Cleon Gustin, MD;  Location: [facility];  Service: Urology;  Laterality: Right;  . CYSTOSCOPY W/ URETERAL STENT PLACEMENT Right 03/28/2016   Procedure: CYSTOSCOPY WITH RETROGRADE PYELOGRAM/URETERAL STENT EXCHANGE;  Surgeon: Cleon Gustin, MD;  Location: [facility];  Service: Urology;  Laterality: Right;  . CYSTOSCOPY W/ URETERAL STENT PLACEMENT Right 07/18/2016   Procedure: CYSTOSCOPY WITH RETROGRADE PYELOGRAM/ STENT EXCHANGE;  Surgeon: Cleon Gustin, MD;  Location: [facility];  Service: Urology;  Laterality: Right;  . CYSTOSCOPY W/ URETERAL STENT PLACEMENT Right 10/10/2016   Procedure: CYSTOSCOPY WITH RETROGRADE PYELOGRAM/URETERAL STENT REPLACEMENT;  Surgeon: Cleon Gustin, MD;  Location: [facility];  Service: Urology;  Laterality: Right;  . CYSTOSCOPY W/ URETERAL STENT PLACEMENT Right 04/10/2017   Procedure: CYSTOSCOPY WITH RETROGRADE PYELOGRAM/URETERAL  STENT EXCHANGE;  Surgeon: Cleon Gustin, MD;  Location: [facility];  Service: Urology;  Laterality: Right;  . CYSTOSCOPY WITH RETROGRADE PYELOGRAM, URETEROSCOPY AND STENT PLACEMENT Right 09/21/2015   Procedure: CYSTOSCOPY WITH BIL RETROGRADE PYELOGRAM, URETEROSCOPY, RIGHT STENT PLACEMENT ;  Surgeon: Cleon Gustin, MD;  Location: [facility] [place];  Service: Urology;  Laterality: Right;  . HEMICOLECTOMY Right 01/23/2015   Dr. Hassell Done  . IR FLUORO GUIDE PORT INSERTION RIGHT  03/24/2017  . IR US GUIDE VASC ACCESS RIGHT  03/24/2017  . LAPAROSCOPY N/A 01/23/2015   Procedure: DIAGNOSTIC LAPAROSCOPY, EXPLORATORY LAPAROTOMY  RIGHT HEMI-COLECTOMY FOR OBSTRUCTION OF RIGHT TERMINAL ILEUM;  Surgeon: [name], MD;  Location: [facility];  Service: General;  Laterality: N/A;    I  have reviewed the social history and family history with the patient and they are unchanged from previous note.  ALLERGIES:  is allergic to namenda [memantine hcl] and oxycodone.  MEDICATIONS:  Current Outpatient Medications  Medication Sig Dispense Refill  . amLODipine (NORVASC) 5 MG tablet Take 5 mg by mouth every morning.     Marland Kitchen aspirin 81 MG tablet Take 81 mg by mouth daily.    . Cholecalciferol (VITAMIN D3) 2000 UNITS TABS Take by mouth daily.    . Coenzyme Q10 (CO Q 10) 100 MG CAPS Take by mouth daily.    Marland Kitchen lidocaine-prilocaine (EMLA) cream Apply 1 application topically as needed. 30 g 5  . medium chain triglycerides (MCT OIL) oil Take 5 mLs by mouth 2 (two) times daily.     . Melatonin 5 MG TABS Take 5-15 mg by mouth at bedtime as needed.    . Meth-Hyo-M Bl-Na Phos-Ph Sal (URIBEL) 118 MG CAPS Take 1 capsule (118 mg total) by mouth 2 (two) times daily as needed. 30 capsule 3  . mirabegron ER (MYRBETRIQ) 50 MG TB24 tablet Take 1 tablet (50 mg total) by mouth daily. 30 tablet 11  . mirtazapine (REMERON) 15 MG tablet TAKE 1 TABLET BY MOUTH AT BEDTIME 30 tablet 3  . Multiple Vitamins-Minerals (WOMENS MULTIVITAMIN PO) Take 1 tablet by mouth daily. With iron    . polyethylene glycol (MIRALAX / GLYCOLAX) packet Take 17 g by mouth daily. (Patient taking differently: Take 17 g by mouth daily as needed. ) 14 each 0  . prochlorperazine (COMPAZINE) 5 MG tablet Take 1 tablet (5 mg total) by mouth every 6 (six) hours as needed for nausea or vomiting. 30 tablet 0  . QUEtiapine (SEROQUEL) 25 MG tablet Take 1 tablet each night 30 tablet 6  . traMADol (ULTRAM) 50 MG tablet Take 1 tablet (50 mg total) by mouth every 6 (six) hours as needed. 30 tablet 0  . UNABLE TO FIND Med Name: Closys Mouth Rinse two-three times daily for ulcer.    . vitamin B-12 (CYANOCOBALAMIN) 1000 MCG tablet Take 1,000 mcg by mouth daily.     No current facility-administered medications for this visit.    Facility-Administered  Medications Ordered in Other Visits  Medication Dose Route Frequency Provider Last Rate Last Dose  . atropine injection 0.5 mg  0.5 mg Intravenous Once PRN [name], MD      . bevacizumab (AVASTIN) 275 mg in sodium chloride 0.9 % 100 mL chemo infusion  5 mg/kg (Treatment Plan Recorded) Intravenous Once [name], MD      . dexamethasone (DECADRON) injection 10 mg  10 mg Intravenous Once [name], MD      . heparin lock flush 100 unit/mL  500 Units Intravenous Once [name], MD      . heparin lock flush 100 unit/mL  500 Units Intracatheter Once PRN [name], MD      . irinotecan (CAMPTOSAR) 240 mg in dextrose 5 % 500 mL chemo infusion  150 mg/m2 (Treatment Plan Recorded) Intravenous Once [name], MD      . sodium chloride flush (NS) 0.9 % injection 10 mL  10 mL Intracatheter PRN [name], MD        PHYSICAL EXAMINATION: ECOG PERFORMANCE STATUS: 1 - Symptomatic but completely ambulatory  Vitals:   09/29/17 1021 09/29/17 1100  BP: (!) 163/91 (!) 160/80  Pulse: 86   Resp: 18   Temp: 97.7 F (36.5 C)   SpO2: 100%    Filed Weights   09/29/17 1021  Weight: 112 lb 11.2 oz (51.1 kg)    GENERAL:alert, no distress and comfortable SKIN: no rashes or significant lesions EYES: sclera clear OROPHARYNX:no thrush or ulcers LYMPH:  no palpable cervical or supraclavicular lymphadenopathy LUNGS: clear to auscultation with normal breathing effort HEART: regular rate & rhythm and no murmurs and no lower extremity edema ABDOMEN:abdomen soft, non-tender and normal bowel sounds Musculoskeletal:no cyanosis of digits and no clubbing  NEURO: alert & oriented x 3 with fluent speech.  Normal gait PAC without erythema  LABORATORY DATA:  I have reviewed the data as listed CBC Latest Ref Rng & Units 09/29/2017 09/15/2017 09/01/2017  WBC 3.9 - 10.3 K/uL 8.0 6.1 6.5  Hemoglobin 11.6 - 15.9 g/dL 10.6(L) 10.5(L) 10.7(L)  Hematocrit 34.8 - 46.6 % 33.5(L) 32.5(L) 33.3(L)  Platelets 145 - 400 K/uL 337 324  339     CMP Latest Ref Rng & Units 09/29/2017 09/15/2017 09/01/2017  Glucose 70 - 99 mg/dL 90 82 98  BUN 8 - 23 mg/dL _0 Creatinine 0.44 - 1.00 mg/dL 1.16(H) 1.46(H) 1.51(H)  Sodium 135 - 145 mmol/L 136 143 141  Potassium 3.5 - 5.1 mmol/L 4.1 3.8 4.0  Chloride 98 - 111 mmol/L 104 110 110  CO2 22 - 32 mmol/L _1 Calcium 8.9 - 10.3 mg/dL 8.8(L) 8.8(L) 8.7(L)  Total Protein 6.5 - 8.1 g/dL 7.0 6.9 7.0  Total Bilirubin 0.3 - 1.2 mg/dL 0.3 0.3 0.5  Alkaline Phos 38 - 126 U/L 76 69 58  AST 15 - 41 U/L _2 ALT 0 - 44 U/L _3 CEA   01/23/2015: 5.7 06/05/2015: 6.1 08/04/2015: 10.3 10/12/2015: 13.29 (in-house)  11/04/2015: 12.83 01/13/2016: 8.57 02/24/2016: 8.41 04/27/2016: 9.22 05/20/2016: 8.32 06/17/2016: 6.45 07/15/2016: 5.07 07/29/2016: 6.51 09/09/16: 7.47 10/07/16: 7.85 11/04/16: 7.75 12/02/16: 8.13 01/06/17: 10.36 02/03/17: 12.60 03/03/18: 9.96  03/31/17: 12.7 04/14/17: 11.93 05/12/17: 13.00 06/09/17: 14.97 07/07/17: 15.22 08/04/17: 17.26 09/01/17: 21.94 09/29/17: 25.89   PATHOLOGY REPORT: Diagnosis 01/23/2015 Colon, segmental resection, with terminal ileum and cecum - INVASIVE ADENOCARCINOMA EXTENDING INTO PERICECAL CONNECTIVE TISSUE AND INVOLVING SMALL INTESTINE. - THREE BENIGN LYMPH NODES (0/3). - SEE ONCOLOGY TABLE BELOW.  Microscopic Comment COLON AND RECTUM (INCLUDING TRANS-ANAL RESECTION): Specimen: Terminal ileum and cecum. Procedure: Resection. Tumor site: Cecum adjacent to appendix. Specimen integrity: Intact. Macroscopic intactness of mesorectum: Not applicable. Macroscopic tumor perforation: No. Invasive tumor: Maximum size: 4.0 cm. Histologic type(s): Colorectal adenocarcinoma. Histologic grade and differentiation: G2: moderately differentiated/low grade. Type of polyp in which invasive carcinoma arose: No residual polyp. Microscopic extension of invasive tumor: Into pericecal connective tissue and terminal ileum. Lymph-Vascular  invasion: Not identified. Peri-neural invasion: Not  identified. Tumor deposit(s) (discontinuous extramural extension): No. Resection margins: Proximal margin: Free of tumor. Distal margin: Free of tumor. Circumferential (radial) (posterior ascending, posterior descending; lateral and posterior mid-rectum; and entire lower 1/3 rectum): Free of tumor. Mesenteric margin (sigmoid and transverse): N/A. Distance closest margin (if all above margins negative): N/A. Treatment effect (neo-adjuvant therapy): No. Additional polyp(s): No. Non-neoplastic findings: N/A. Lymph nodes: number examined 3; number positive: 0. Pathologic Staging: pT4b, pN0, pMX. Ancillary studies: Mismatch repair protein by immunohistochemistry pending. Comments: There is a colorectal-type adenocarcinoma arising in the cecum in the area of the appendiceal orifice. The tumor extends into the pericecal connective tissue, and involves the attached segment of terminal ileum. Immunohistochemistry shows the tumor is positive with CDX2 and cytokeratin 20, and negative with cytokeratin 7, estrogen receptor and WT-1 supporting a diagnosis of primary colorectal adenocarcinoma. ADDITIONAL INFORMATION: Mismatch Repair (MMR) Protein Immunohistochemistry (IHC) IHC Expression Result: MLH1: Preserved nuclear expression (greater 50% tumor expression) MSH2: Preserved nuclear expression (greater 50% tumor expression) MSH6: Preserved nuclear expression (greater 50% tumor expression) PMS2: Preserved nuclear expression (greater 50% tumor expression) * Internal control demonstrates intact nuclear expression Interpretation: NORMAL There is preserved expression of the major and minor MMR proteins. There is a very low probability that microsatellite instability (MSI) is present. However, certain clinically significant MMR protein mutations may result in preservation of nuclear expression. It is recommended that the preservation of protein  expression be correlated with molecular based MSI testing.        RADIOGRAPHIC STUDIES: I have personally reviewed the radiological images as listed and agreed with the findings in the report. Nm Pet Image Restag (ps) Skull Base To Thigh  Result Date: 09/28/2017 CLINICAL DATA:  Subsequent treatment strategy for metastatic colon cancer. EXAM: NUCLEAR MEDICINE PET SKULL BASE TO THIGH TECHNIQUE: 5.6 mCi F-18 FDG was injected intravenously. Full-ring PET imaging was performed from the skull base to thigh after the radiotracer. CT data was obtained and used for attenuation correction and anatomic localization. Fasting blood glucose: 77 mg/dl COMPARISON:  07/03/2017 FINDINGS: Mediastinal blood pool activity: SUV max 2.3 NECK: No hypermetabolic lymph nodes in the neck. Incidental CT findings: Stable 8 mm left thyroid nodule without hypermetabolism. CHEST: No hypermetabolic mediastinal or hilar nodes. No hypermetabolic pulmonary nodules. Scattered tiny bilateral pulmonary nodules are similar. Index right upper lobe nodule (8:20) is 5 mm today compared to 4 mm previously. Index nodule in the right lower lobe (8:36) is 5 mm today compared to 6 mm previously. Medial left upper lobe index nodule is 6 mm today (8:30) compared to 7 mm previously. No edema or pleural effusion. No new pulmonary nodule or mass. Incidental CT findings: Emphysema. Atherosclerotic calcification is noted in the wall of the thoracic aorta. Right Port-A-Cath tip is in the distal SVC. ABDOMEN/PELVIS: Hypermetabolic liver metastases again identified. Index segment II lesion demonstrates SUV max = 12 today compared to 10 previously. Index segment VII lesion demonstrates SUV max = 10.4 today compared to 10.0 previously. Index omental nodule identified previously at 11 mm thickness measures 10 mm today (4:132). SUV max = 3.9 today compared to 4.2 previously. Index lesion identified previously in the left rectus muscle near the midline  demonstrates SUV max = 4.6 today compared to 5.0 previously. Hypermetabolic deposit in the right lower quadrant identified previously is again noted with SUV max = 5.8 today compared to 6.4 previously. Incidental CT findings: Bilateral renal cysts again noted. Right internal ureteral stent device is in stable position. Fibroid change noted in the  uterus. SKELETON: No focal hypermetabolic activity to suggest skeletal metastasis. Incidental CT findings: none IMPRESSION: 1. Relatively stable exam with no clear trend towards worsening disease or generalized improvement. 2. Scattered tiny bilateral pulmonary nodules without substantial interval change. 3. Index hypermetabolic lesions in the liver, omentum, right lower quadrant mesentery, and anterior abdominal wall show similar hypermetabolism to the prior study. 4.  Aortic Atherosclerois (ICD10-170.0) 5.  Emphysema. ([id number]-Y60.9) Electronically Signed   By: Misty Stanley M.D.   On: 09/28/2017 08:51     ASSESSMENT & PLAN: 77 y.o. African-American female, with past medical history of moderate dementia, independent with ADLs, hypertension, presents with small bowel obstruction and weight loss.  1. 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  CKD stage III, right hydroureter 3.  HTN, dementia 4.  Goal of care discussion 5.  Insomnia 6.  Social support  Ms. Kolodziej appears stable.  She completed cycle 34 Irinotecan/Avastin.  She is tolerating treatment well overall without significant side effects from treatment or symptoms of her disease.  She underwent restaging PET which I reviewed with her and her daughter today.  PET scan is relatively stable without clear evidence of disease progression in the chest, abdomen, or pelvis; and no new findings.  Given her overall good tolerance and performance status I recommend to continue treatment on the current regimen.  I reviewed  chemo breaks are currently reasonable.  The patient and her daughter agreed to proceed as scheduled.  She will call to cancel if patient desires a break.  Plan to see her back in 2 weeks prior to next cycle.  Labs reviewed: CBC is stable, creatinine improved, urine protein 30.  CEA slightly trending up.  Labs adequate to proceed with treatment.  We will continue to monitor. BP improved on recheck in clinic.   Plan: -Labs and imaging reviewed, proceed with next cycle Irinotecan/Avastin -Lab, f/u with Dr. Burr Medico in 2 weeks with next cycle   All questions were answered. The patient knows to call the clinic with any problems, questions or concerns. No barriers to learning was detected. I spent 20 minutes counseling the patient face to face. The total time spent in the appointment was 25 minutes and more than 50% was on counseling and review of test results     Alla Feeling, NP 09/29/17

## 2017-10-03 MED FILL — AMLODIPINE BESYLATE 5 MG TA: 5 | 30 days supply | Qty: 30 | Fill #4

## 2017-10-04 ENCOUNTER — Encounter (HOSPITAL_BASED_OUTPATIENT_CLINIC_OR_DEPARTMENT_OTHER): Payer: Self-pay | Admitting: *Deleted

## 2017-10-04 ENCOUNTER — Other Ambulatory Visit: Payer: Self-pay

## 2017-10-04 NOTE — Progress Notes (Signed)
Spoke with patient's daughter Beth Wilkins via telephone for pre op interview. Patient to be NPO after MN can have clear liquids until 0715. Patient to take Norvasc AM of surgery. Patient has hx of dementia so daughter needs to be brought back to pre op.

## 2017-10-09 ENCOUNTER — Encounter (HOSPITAL_BASED_OUTPATIENT_CLINIC_OR_DEPARTMENT_OTHER)
Admission: RE | Disposition: A | Discharge status: Home / Self Care | Payer: Self-pay | Source: Ambulatory Visit | Attending: Urology

## 2017-10-09 ENCOUNTER — Ambulatory Visit (HOSPITAL_BASED_OUTPATIENT_CLINIC_OR_DEPARTMENT_OTHER): Payer: Medicare Other | Admitting: Anesthesiology

## 2017-10-09 ENCOUNTER — Ambulatory Visit (HOSPITAL_BASED_OUTPATIENT_CLINIC_OR_DEPARTMENT_OTHER)
Admission: RE | Admit: 2017-10-09 | Discharge: 2017-10-09 | Disposition: A | Discharge status: Home / Self Care | Payer: Medicare Other | Source: Ambulatory Visit | Attending: Urology | Admitting: Urology

## 2017-10-09 ENCOUNTER — Encounter (HOSPITAL_BASED_OUTPATIENT_CLINIC_OR_DEPARTMENT_OTHER): Payer: Self-pay

## 2017-10-09 DIAGNOSIS — F0390 Unspecified dementia without behavioral disturbance: Secondary | ICD-10-CM | POA: Insufficient documentation

## 2017-10-09 DIAGNOSIS — Z87440 Personal history of urinary (tract) infections: Secondary | ICD-10-CM | POA: Insufficient documentation

## 2017-10-09 DIAGNOSIS — E785 Hyperlipidemia, unspecified: Secondary | ICD-10-CM | POA: Diagnosis not present

## 2017-10-09 DIAGNOSIS — I739 Peripheral vascular disease, unspecified: Secondary | ICD-10-CM | POA: Diagnosis not present

## 2017-10-09 DIAGNOSIS — Z8041 Family history of malignant neoplasm of ovary: Secondary | ICD-10-CM | POA: Insufficient documentation

## 2017-10-09 DIAGNOSIS — Z888 Allergy status to other drugs, medicaments and biological substances status: Secondary | ICD-10-CM | POA: Diagnosis not present

## 2017-10-09 DIAGNOSIS — Z9842 Cataract extraction status, left eye: Secondary | ICD-10-CM | POA: Diagnosis not present

## 2017-10-09 DIAGNOSIS — I700 Atherosclerosis of aorta: Secondary | ICD-10-CM | POA: Diagnosis not present

## 2017-10-09 DIAGNOSIS — Z87891 Personal history of nicotine dependence: Secondary | ICD-10-CM | POA: Diagnosis not present

## 2017-10-09 DIAGNOSIS — C787 Secondary malignant neoplasm of liver and intrahepatic bile duct: Secondary | ICD-10-CM | POA: Insufficient documentation

## 2017-10-09 DIAGNOSIS — Z885 Allergy status to narcotic agent status: Secondary | ICD-10-CM | POA: Diagnosis not present

## 2017-10-09 DIAGNOSIS — N183 Chronic kidney disease, stage 3 (moderate): Secondary | ICD-10-CM | POA: Diagnosis not present

## 2017-10-09 DIAGNOSIS — Z9049 Acquired absence of other specified parts of digestive tract: Secondary | ICD-10-CM | POA: Insufficient documentation

## 2017-10-09 DIAGNOSIS — I129 Hypertensive chronic kidney disease with stage 1 through stage 4 chronic kidney disease, or unspecified chronic kidney disease: Secondary | ICD-10-CM | POA: Insufficient documentation

## 2017-10-09 DIAGNOSIS — C786 Secondary malignant neoplasm of retroperitoneum and peritoneum: Secondary | ICD-10-CM | POA: Insufficient documentation

## 2017-10-09 DIAGNOSIS — Z85038 Personal history of other malignant neoplasm of large intestine: Secondary | ICD-10-CM | POA: Insufficient documentation

## 2017-10-09 DIAGNOSIS — Z961 Presence of intraocular lens: Secondary | ICD-10-CM | POA: Diagnosis not present

## 2017-10-09 DIAGNOSIS — N135 Crossing vessel and stricture of ureter without hydronephrosis: Secondary | ICD-10-CM | POA: Diagnosis not present

## 2017-10-09 DIAGNOSIS — K219 Gastro-esophageal reflux disease without esophagitis: Secondary | ICD-10-CM | POA: Insufficient documentation

## 2017-10-09 DIAGNOSIS — J432 Centrilobular emphysema: Secondary | ICD-10-CM | POA: Insufficient documentation

## 2017-10-09 DIAGNOSIS — Z9221 Personal history of antineoplastic chemotherapy: Secondary | ICD-10-CM | POA: Insufficient documentation

## 2017-10-09 DIAGNOSIS — Z8049 Family history of malignant neoplasm of other genital organs: Secondary | ICD-10-CM | POA: Diagnosis not present

## 2017-10-09 DIAGNOSIS — Z9841 Cataract extraction status, right eye: Secondary | ICD-10-CM | POA: Insufficient documentation

## 2017-10-09 HISTORY — PX: CYSTOSCOPY W/ URETERAL STENT PLACEMENT: SHX1429

## 2017-10-09 LAB — POCT I-STAT, CHEM 8
BUN: 21 mg/dL (ref 8–23)
CALCIUM ION: 1.17 mmol/L (ref 1.15–1.40)
CHLORIDE: 109 mmol/L (ref 98–111)
Creatinine, Ser: 1.4 mg/dL — ABNORMAL HIGH (ref 0.44–1.00)
Glucose, Bld: 86 mg/dL (ref 70–99)
HCT: 34 % — ABNORMAL LOW (ref 36.0–46.0)
HEMOGLOBIN: 11.6 g/dL — AB (ref 12.0–15.0)
Potassium: 4.1 mmol/L (ref 3.5–5.1)
SODIUM: 143 mmol/L (ref 135–145)
TCO2: 22 mmol/L (ref 22–32)

## 2017-10-09 SURGERY — CYSTOSCOPY, WITH RETROGRADE PYELOGRAM AND URETERAL STENT INSERTION
Anesthesia: General | Laterality: Right

## 2017-10-09 MED ORDER — ARTIFICIAL TEARS OPHTHALMIC OINT
TOPICAL_OINTMENT | OPHTHALMIC | Status: AC
  Filled 2017-10-09: qty 3.5

## 2017-10-09 MED ORDER — FENTANYL CITRATE (PF) 100 MCG/2ML IJ SOLN
INTRAMUSCULAR | Status: AC
  Filled 2017-10-09: qty 2

## 2017-10-09 MED ORDER — FENTANYL CITRATE (PF) 100 MCG/2ML IJ SOLN
INTRAMUSCULAR | Status: DC | PRN
  Administered 2017-10-09: 50 ug via INTRAVENOUS
  Administered 2017-10-09 (×2): 25 ug via INTRAVENOUS

## 2017-10-09 MED ORDER — PROPOFOL 10 MG/ML IV BOLUS
INTRAVENOUS | Status: DC | PRN
  Administered 2017-10-09: 150 mg via INTRAVENOUS

## 2017-10-09 MED ORDER — CEFAZOLIN SODIUM-DEXTROSE 2-4 GM/100ML-% IV SOLN
INTRAVENOUS | Status: AC
  Filled 2017-10-09: qty 100

## 2017-10-09 MED ORDER — STERILE WATER FOR IRRIGATION IR SOLN
Status: DC | PRN
  Administered 2017-10-09: 3000 mL

## 2017-10-09 MED ORDER — DEXAMETHASONE SODIUM PHOSPHATE 10 MG/ML IJ SOLN
INTRAMUSCULAR | Status: AC
  Filled 2017-10-09: qty 1

## 2017-10-09 MED ORDER — LIDOCAINE 2% (20 MG/ML) 5 ML SYRINGE
INTRAMUSCULAR | Status: AC
  Filled 2017-10-09: qty 5

## 2017-10-09 MED ORDER — HYDROMORPHONE HCL 1 MG/ML IJ SOLN
0.2500 mg | INTRAMUSCULAR | Status: DC | PRN
  Filled 2017-10-09: qty 0.5

## 2017-10-09 MED ORDER — CEFAZOLIN SODIUM-DEXTROSE 2-4 GM/100ML-% IV SOLN
2.0000 g | INTRAVENOUS | Status: AC
  Administered 2017-10-09: 2 g via INTRAVENOUS
  Filled 2017-10-09: qty 100

## 2017-10-09 MED ORDER — IOPAMIDOL (ISOVUE-370) INJECTION 76%
INTRAVENOUS | Status: DC | PRN
  Administered 2017-10-09: 4 mL

## 2017-10-09 MED ORDER — ONDANSETRON HCL 4 MG/2ML IJ SOLN
INTRAMUSCULAR | Status: DC | PRN
  Administered 2017-10-09: 4 mg via INTRAVENOUS

## 2017-10-09 MED ORDER — ONDANSETRON HCL 4 MG/2ML IJ SOLN
INTRAMUSCULAR | Status: AC
  Filled 2017-10-09: qty 2

## 2017-10-09 MED ORDER — SODIUM CHLORIDE 0.9 % IV SOLN
INTRAVENOUS | Status: DC
  Administered 2017-10-09: 12:00:00 via INTRAVENOUS
  Filled 2017-10-09: qty 1000

## 2017-10-09 MED ORDER — PROPOFOL 10 MG/ML IV BOLUS
INTRAVENOUS | Status: AC
  Filled 2017-10-09: qty 40

## 2017-10-09 MED ORDER — PROMETHAZINE HCL 25 MG/ML IJ SOLN
6.2500 mg | INTRAMUSCULAR | Status: DC | PRN
  Filled 2017-10-09: qty 1

## 2017-10-09 SURGICAL SUPPLY — 18 items
BAG DRAIN URO-CYSTO SKYTR STRL (DRAIN) ×6 IMPLANT
CATH INTERMIT  6FR 70CM (CATHETERS) IMPLANT
CLOTH BEACON ORANGE TIMEOUT ST (SAFETY) ×3 IMPLANT
GLOVE BIO SURGEON STRL SZ8 (GLOVE) ×3 IMPLANT
GOWN STRL REUS W/TWL XL LVL3 (GOWN DISPOSABLE) ×3 IMPLANT
GUIDEWIRE ANG ZIPWIRE 038X150 (WIRE) ×3 IMPLANT
GUIDEWIRE STR DUAL SENSOR (WIRE) ×3 IMPLANT
INFUSOR MANOMETER BAG 3000ML (MISCELLANEOUS) ×3 IMPLANT
IV NS IRRIG 3000ML ARTHROMATIC (IV SOLUTION) ×3 IMPLANT
KIT TURNOVER CYSTO (KITS) ×3 IMPLANT
MANIFOLD NEPTUNE II (INSTRUMENTS) IMPLANT
NS IRRIG 500ML POUR BTL (IV SOLUTION) ×3 IMPLANT
PACK CYSTO (CUSTOM PROCEDURE TRAY) ×3 IMPLANT
STENT POLARIS LOOP 8FR X 24 CM (STENTS) ×3 IMPLANT
SYR 10ML LL (SYRINGE) ×3 IMPLANT
TUBE CONNECTING 12'X1/4 (SUCTIONS)
TUBE CONNECTING 12X1/4 (SUCTIONS) IMPLANT
TUBING UROLOGY SET (TUBING) IMPLANT

## 2017-10-09 NOTE — H&P (Signed)
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 She denies any flank pain. No worsening LUTS  Past Medical History:  Diagnosis Date  . Anxiety   . Aortic atherosclerosis ([place]) 03/15/2017   noted on PET   . Arthritis   . Bilateral renal cysts   . Centrilobular emphysema ([place]) per 05-10-16 chest xray  . CKD (chronic kidney disease), stage III ([place])   . 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 Complication of anesthesia    can be combative due to dementia  . Full dentures   . Headache    occ stress related  . History of chemotherapy 2019   last chemotherapy infusion 09/29/2017  . History of gastroesophageal reflux (GERD)    changes diet  . History of pelvic fracture 2015  . Hyperlipidemia   . Hypertension   . Liver metastasis ([place])   . 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 N&V (nausea and vomiting)   . Pulmonary nodules   . Right ovarian cyst   . SBO (small bowel obstruction) ([place]) 01/2015  . 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 UTI (urinary tract infection) 09/26/2016   5 day antibiotic treatment   Past Surgical History:  Procedure Laterality Date  . CATARACT EXTRACTION W/ INTRAOCULAR LENS  IMPLANT, BILATERAL  2005 approx  . CYSTOSCOPY W/ URETERAL STENT PLACEMENT Right 12/03/2015   Procedure: CYSTOSCOPY WITH RETROGRADE  PYELOGRAM/URETERAL STENT PLACEMENT;  Surgeon: Cleon Gustin, MD;  Location: [facility];  Service: Urology;  Laterality: Right;  . CYSTOSCOPY W/ URETERAL STENT PLACEMENT Right 02/15/2016   Procedure: CYSTOSCOPY WITH RETROGRADE PYELOGRAM/URETERAL STENT REPLACEMENT;  Surgeon: Cleon Gustin, MD;  Location[facility];  Service: Urology;  Laterality: Right;  . CYSTOSCOPY W/ URETERAL STENT PLACEMENT Right 03/28/2016   Procedure: CYSTOSCOPY WITH RETROGRADE PYELOGRAM/URETERAL STENT EXCHANGE;  Surgeon: Cleon Gustin, MD;  Location[facility];  Service: Urology;  Laterality: Right;  . CYSTOSCOPY W/ URETERAL STENT PLACEMENT Right 07/18/2016   Procedure: CYSTOSCOPY WITH RETROGRADE PYELOGRAM/ STENT EXCHANGE;  Surgeon: Cleon Gustin, MD;  Location: [facility];  Service: Urology;  Laterality: Right;  . CYSTOSCOPY W/ URETERAL STENT PLACEMENT Right 10/10/2016   Procedure: CYSTOSCOPY WITH RETROGRADE PYELOGRAM/URETERAL STENT REPLACEMENT;  Surgeon: Cleon Gustin, MD;  Location: [facility];  Service: Urology;  Laterality: Right;  . CYSTOSCOPY W/ URETERAL STENT PLACEMENT Right 04/10/2017   Procedure: CYSTOSCOPY WITH RETROGRADE PYELOGRAM/URETERAL STENT EXCHANGE;  Surgeon: Cleon Gustin, MD;  Location: [facility];  Service: Urology;  Laterality: Right;  . CYSTOSCOPY WITH RETROGRADE PYELOGRAM, URETEROSCOPY AND STENT PLACEMENT Right 09/21/2015   Procedure: CYSTOSCOPY WITH BIL RETROGRADE PYELOGRAM, URETEROSCOPY, RIGHT STENT PLACEMENT ;  Surgeon: Cleon Gustin, MD;  Location: [facility];  Service: Urology;  Laterality: Right;  . HEMICOLECTOMY Right  01/23/2015   Dr. Hassell Done  . IR FLUORO GUIDE PORT INSERTION RIGHT  03/24/2017  . IR US GUIDE VASC ACCESS RIGHT  03/24/2017  . LAPAROSCOPY N/A 01/23/2015   Procedure: DIAGNOSTIC LAPAROSCOPY, EXPLORATORY LAPAROTOMY  RIGHT HEMI-COLECTOMY FOR  OBSTRUCTION OF RIGHT TERMINAL ILEUM;  Surgeon: [name], MD;  Location: [facility];  Service: General;  Laterality: N/A;  . PORTACATH PLACEMENT      Home Medications:  Current Facility-Administered Medications  Medication Dose Route Frequency Provider Last Rate Last Dose  . 0.9 %  sodium chloride infusion   Intravenous Continuous Lynda Rainwater, MD 50 mL/hr at 10/09/17 1156    . ceFAZolin (ANCEF) IVPB 2g/100 mL premix  2 g Intravenous 30 min Pre-Op Juandedios Dudash, [name], MD       Facility-Administered Medications Ordered in Other Encounters  Medication Dose Route Frequency Provider Last Rate Last Dose  . heparin lock flush 100 unit/mL  500 Units Intravenous Once [name], MD       Allergies:  Allergies  Allergen Reactions  . Namenda [Memantine Hcl] Other (See Comments)    Hallucinations and headaches  . Oxycodone Other (See Comments)    Hallucinations     Family History  Problem Relation Age of Onset  . Diabetes Mellitus II Father   . Cancer Mother 70       ovarian cancer   . Cervical cancer Other    Social History:  reports that she quit smoking about 2 years ago. Her smoking use included cigarettes. She has a 12.50 pack-year smoking history. She has never used smokeless tobacco. She reports that she drinks about 1.8 oz of alcohol per week. She reports that she does not use drugs.  Review of Systems  All other systems reviewed and are negative.   Physical Exam:  Vital signs in last 24 hours: Temp:  [97.9 F (36.6 C)] 97.9 F (36.6 C) (08/05 1111) Pulse Rate:  [78] 78 (08/05 1111) Resp:  [16] 16 (08/05 1111) BP: (152)/(94) 152/94 (08/05 1111) SpO2:  [100 %] 100 % (08/05 1111) Weight:  [49.2 kg (108 lb 6.4 oz)] 49.2 kg (108 lb 6.4 oz) (08/05 1111) Physical Exam  Constitutional: She is oriented to person, place, and time. She appears well-developed and well-nourished.  HENT:  Head: Normocephalic and atraumatic.  Eyes: Pupils are equal, round, and reactive to  light. EOM are normal.  Neck: Normal range of motion. No thyromegaly present.  Cardiovascular: Normal rate and regular rhythm.  Respiratory: Effort normal. No respiratory distress.  GI: Soft. She exhibits no distension.  Musculoskeletal: Normal range of motion. She exhibits no edema.  Neurological: She is alert and oriented to person, place, and time.  Skin: Skin is warm and dry.  Psychiatric: She has a normal mood and affect. Her behavior is normal. Judgment and thought content normal.    Laboratory Data:  Results for orders placed or performed during the hospital encounter of 10/09/17 (from the past 24 hour(s))  I-STAT, chem 8     Status: Abnormal   Collection Time: 10/09/17 11:54 AM  Result Value Ref Range   Sodium 143 135 - 145 mmol/L   Potassium 4.1 3.5 - 5.1 mmol/L   Chloride 109 98 - 111 mmol/L   BUN 21 8 - 23 mg/dL   Creatinine, Ser 1.40 (H) 0.44 - 1.00 mg/dL   Glucose, Bld 86 70 - 99 mg/dL   Calcium, Ion 1.17 1.15 - 1.40 mmol/L   TCO2 22 22 - 32 mmol/L  Hemoglobin 11.6 (L) 12.0 - 15.0 g/dL   HCT 34.0 (L) 36.0 - 46.0 %   No results found for this or any previous visit (from the past 240 hour(s)). Creatinine: Recent Labs    10/09/17 1154  CREATININE 1.40*   Baseline Creatinine: 1.4  Impression/Assessment:  76yo with rigth malignant obstruction managed with ureteral stent  Plan:  The risks/benefits/alternaitves to right ureteral stent exchange was explained to the patient and she understands and wishes to proceed with surgery  [name] 10/09/2017, 12:59 PM

## 2017-10-09 NOTE — Transfer of Care (Signed)
Immediate Anesthesia Transfer of Care Note  Patient: Beth Wilkins  Procedure(s) Performed: Procedure(s) (LRB): CYSTOSCOPY WITH RETROGRADE PYELOGRAM/URETERAL STENT PLACEMENT (Right)  Patient Location: PACU  Anesthesia Type: General  Level of Consciousness: awake, sedated, patient cooperative and responds to stimulation  Airway & Oxygen Therapy: Patient Spontanous Breathing and Patient connected to NCO2  Post-op Assessment: Report given to PACU RN, Post -op Vital signs reviewed and stable and Patient moving all extremities  Post vital signs: Reviewed and stable  Complications: No apparent anesthesia complications

## 2017-10-09 NOTE — Anesthesia Preprocedure Evaluation (Signed)
Anesthesia Evaluation  Patient identified by MRN, date of birth, ID band Patient awake    Reviewed: Allergy & Precautions, NPO status , Patient's Chart, lab work & pertinent test results  Airway Mallampati: II  TM Distance: >3 FB Neck ROM: Full    Dental no notable dental hx. (+) Teeth Intact   Pulmonary COPD, former smoker,    breath sounds clear to auscultation       Cardiovascular hypertension, Pt. on medications + Peripheral Vascular Disease   Rhythm:Regular Rate:Normal     Neuro/Psych  Headaches, PSYCHIATRIC DISORDERS Anxiety Dementia    GI/Hepatic GERD  ,  Endo/Other    Renal/GU Renal InsufficiencyRenal disease     Musculoskeletal  (+) Arthritis ,   Abdominal Normal abdominal exam  (+)   Peds  Hematology  (+) anemia ,   Anesthesia Other Findings   Reproductive/Obstetrics                             Anesthesia Physical  Anesthesia Plan  ASA: III  Anesthesia Plan: General   Post-op Pain Management:    Induction: Intravenous  PONV Risk Score and Plan: 3 and Ondansetron, Dexamethasone and Midazolam  Airway Management Planned: LMA  Additional Equipment:   Intra-op Plan:   Post-operative Plan: Extubation in OR  Informed Consent: I have reviewed the patients History and Physical, chart, labs and discussed the procedure including the risks, benefits and alternatives for the proposed anesthesia with the patient or authorized representative who has indicated his/her understanding and acceptance.   Dental advisory given  Plan Discussed with: CRNA and Surgeon  Anesthesia Plan Comments:         Anesthesia Quick Evaluation

## 2017-10-09 NOTE — Anesthesia Procedure Notes (Signed)
Procedure Name: LMA Insertion Date/Time: 10/09/2017 1:29 PM Performed by: Justice Rocher, CRNA Pre-anesthesia Checklist: Patient identified, Emergency Drugs available, Suction available and Patient being monitored Patient Re-evaluated:Patient Re-evaluated prior to induction Oxygen Delivery Method: Circle system utilized Preoxygenation: Pre-oxygenation with 100% oxygen Induction Type: IV induction Ventilation: Mask ventilation without difficulty LMA: LMA inserted LMA Size: 4.0 Number of attempts: 1 Airway Equipment and Method: Bite block Placement Confirmation: positive ETCO2 and breath sounds checked- equal and bilateral Tube secured with: Tape Dental Injury: Teeth and Oropharynx as per pre-operative assessment

## 2017-10-09 NOTE — Discharge Instructions (Signed)
Ureteral Stent Implantation, Care After Refer to this sheet in the next few weeks. These instructions provide you with information about caring for yourself after your procedure. Your health care provider may also give you more specific instructions. Your treatment has been planned according to current medical practices, but problems sometimes occur. Call your health care provider if you have any problems or questions after your procedure. What can I expect after the procedure? After the procedure, it is common to have:  Nausea.  Mild pain when you urinate. You may feel this pain in your lower back or lower abdomen. Pain should stop within a few minutes after you urinate. This may last for up to 1 week.  A small amount of blood in your urine for several days.  Follow these instructions at home:  Medicines  Take over-the-counter and prescription medicines only as told by your health care provider.  If you were prescribed an antibiotic medicine, take it as told by your health care provider. Do not stop taking the antibiotic even if you start to feel better.  Do not drive for 24 hours if you received a sedative.  Do not drive or operate heavy machinery while taking prescription pain medicines. Activity  Return to your normal activities as told by your health care provider. Ask your health care provider what activities are safe for you.  Do not lift anything that is heavier than 10 lb (4.5 kg). Follow this limit for 1 week after your procedure, or for as long as told by your health care provider. General instructions  Watch for any blood in your urine. Call your health care provider if the amount of blood in your urine increases.  If you have a catheter: ? Follow instructions from your health care provider about taking care of your catheter and collection bag. ? Do not take baths, swim, or use a hot tub until your health care provider approves.  Drink enough fluid to keep your urine  clear or pale yellow.  Keep all follow-up visits as told by your health care provider. This is important. Contact a health care provider if:  You have pain that gets worse or does not get better with medicine, especially pain when you urinate.  You have difficulty urinating.  You feel nauseous or you vomit repeatedly during a period of more than 2 days after the procedure. Get help right away if:  Your urine is dark red or has blood clots in it.  You are leaking urine (have incontinence).  The end of the stent comes out of your urethra.  You cannot urinate.  You have sudden, sharp, or severe pain in your abdomen or lower back.  You have a fever. This information is not intended to replace advice given to you by your health care provider. Make sure you discuss any questions you have with your health care provider. Document Released: 10/24/2012 Document Revised: 07/30/2015 Document Reviewed: 09/05/2014 Elsevier Interactive Patient Education  2018 [employer] [facility] Care Instructions  Activity: Get plenty of rest for the remainder of the day. A responsible adult should stay with you for 24 hours following the procedure.  For the next 24 hours, DO NOT: -Drive a car -Paediatric nurse -Drink alcoholic beverages -Take any medication unless instructed by your physician -Make any legal decisions or sign important papers.  Meals: Start with liquid foods such as gelatin or soup. Progress to regular foods as tolerated. Avoid greasy, spicy, heavy foods. If  nausea and/or vomiting occur, drink only clear liquids until the nausea and/or vomiting subsides. Call your physician if vomiting continues.  Special Instructions/Symptoms: Your throat may feel dry or sore from the anesthesia or the breathing tube placed in your throat during surgery. If this causes discomfort, gargle with warm salt water. The discomfort should disappear within 24 hours.  If you had a  scopolamine patch placed behind your ear for the management of post- operative nausea and/or vomiting:  1. The medication in the patch is effective for 72 hours, after which it should be removed.  Wrap patch in a tissue and discard in the trash. Wash hands thoroughly with soap and water. 2. You may remove the patch earlier than 72 hours if you experience unpleasant side effects which may include dry mouth, dizziness or visual disturbances. 3. Avoid touching the patch. Wash your hands with soap and water after contact with the patch.

## 2017-10-09 NOTE — Op Note (Signed)
Preoperative diagnosis: right malignant obstruction  Postoperative diagnosis: Same  Procedure: 1 cystoscopy 2. right retrograde pyelography 3. Intraoperative fluoroscopy, under one hour, with interpretation 4. right 8x 24JJ stent exchange  Attending: [name]  Anesthesia: General  Estimated blood loss: None  Drains: Right 8x 24JJ polaris ureteral stent without tether  Specimens: none  Antibiotics: ancef  Findings: right mid ureteral narrowing. nohydronephrosis. No masses/lesions in the bladder. Ureteral orifices in normal anatomic location.  Indications: Patient is a 77 year old female with a history of right malignant obstruction from peritoneal carcinomatosis. After discussing treatment options, they decided proceed with stent exchange.  Procedure her in detail: The patient was brought to the operating room and a brief timeout was done to ensure correct patient, correct procedure, correct site. General anesthesia was administered patient was placed in dorsal lithotomy position. Their genitalia was then prepped and draped in usual sterile fashion. A rigid 87 French cystoscope was passed in the urethra and the bladder. Bladder was inspected free masses or lesions. the ureteral orifices were in the normal orthotopic locations. Using a grasper the right stent was brought to the urethral meatus. A sensor wire was advanced through the stent and up to the renal pelvis.a 6 french ureteral catheter was then instilled into the right ureteral orifice. a gentle retrograde was obtained and findings noted above.We then placed a 8x 24double-j ureteral stent over the original sensor wire. We then removed the wire and good coil was noted in the the renal pelvis under fluoroscopy and the bladder under direct vision. the bladder was then drained and this concluded the procedure which was well tolerated by patient.  Complications: None  Condition: Stable,  extubated, transferred to PACU  Plan: The patient will be discharged home. She will followup in 8weeks with a renal US

## 2017-10-10 ENCOUNTER — Encounter (HOSPITAL_BASED_OUTPATIENT_CLINIC_OR_DEPARTMENT_OTHER): Payer: Self-pay | Admitting: Urology

## 2017-10-10 NOTE — Anesthesia Postprocedure Evaluation (Signed)
Anesthesia Post Note  Patient: Beth Wilkins  Procedure(s) Performed: CYSTOSCOPY WITH RETROGRADE PYELOGRAM/URETERAL STENT PLACEMENT (Right )     Patient location during evaluation: PACU Anesthesia Type: General Level of consciousness: awake Pain management: pain level controlled Vital Signs Assessment: post-procedure vital signs reviewed and stable Respiratory status: spontaneous breathing Cardiovascular status: stable Postop Assessment: no apparent nausea or vomiting Anesthetic complications: no    Last Vitals:  Vitals:   10/09/17 1430 10/09/17 1520  BP: (!) 156/99 (!) 145/79  Pulse: 82 74  Resp: 18 16  Temp:  36.7 C  SpO2: 100% 100%    Last Pain:  Vitals:   10/10/17 1007  TempSrc:   PainSc: 0-No pain   Pain Goal: Patients Stated Pain Goal: 3 (10/09/17 1134)               [place]

## 2017-10-13 ENCOUNTER — Inpatient Hospital Stay: Payer: Medicare Other | Attending: Hematology | Admitting: Hematology

## 2017-10-13 ENCOUNTER — Inpatient Hospital Stay: Payer: Medicare Other

## 2017-10-13 ENCOUNTER — Telehealth: Payer: Self-pay

## 2017-10-13 DIAGNOSIS — C180 Malignant neoplasm of cecum: Secondary | ICD-10-CM | POA: Insufficient documentation

## 2017-10-13 DIAGNOSIS — Z87891 Personal history of nicotine dependence: Secondary | ICD-10-CM | POA: Insufficient documentation

## 2017-10-13 DIAGNOSIS — C7989 Secondary malignant neoplasm of other specified sites: Secondary | ICD-10-CM | POA: Insufficient documentation

## 2017-10-13 DIAGNOSIS — Z5112 Encounter for antineoplastic immunotherapy: Secondary | ICD-10-CM | POA: Insufficient documentation

## 2017-10-13 DIAGNOSIS — M7989 Other specified soft tissue disorders: Secondary | ICD-10-CM | POA: Insufficient documentation

## 2017-10-13 DIAGNOSIS — C787 Secondary malignant neoplasm of liver and intrahepatic bile duct: Secondary | ICD-10-CM | POA: Insufficient documentation

## 2017-10-13 DIAGNOSIS — N183 Chronic kidney disease, stage 3 (moderate): Secondary | ICD-10-CM | POA: Insufficient documentation

## 2017-10-13 DIAGNOSIS — I129 Hypertensive chronic kidney disease with stage 1 through stage 4 chronic kidney disease, or unspecified chronic kidney disease: Secondary | ICD-10-CM | POA: Insufficient documentation

## 2017-10-13 DIAGNOSIS — J439 Emphysema, unspecified: Secondary | ICD-10-CM | POA: Insufficient documentation

## 2017-10-13 NOTE — Telephone Encounter (Signed)
Spoke with patient's daughter Lorriane Shire to check on patient as had not shown up for her appointments.  Patient is doing well had urethral stent replaced on Monday, she couldn't get her up this morning but did not identify any particular issues only that she let her sleep.   Will send scheduling message to reschedule.

## 2017-10-16 MED FILL — MIRTAZAPINE 15 MG TABLET: 15 | 30 days supply | Qty: 30 | Fill #1

## 2017-10-16 MED FILL — MYRBETRIQ ER 50 MG TABLET: 50 | 30 days supply | Qty: 30 | Fill #6

## 2017-10-16 MED FILL — QUETIAPINE 25 MG TABLET: 25 | 30 days supply | Qty: 30 | Fill #2

## 2017-10-17 ENCOUNTER — Telehealth: Payer: Self-pay | Admitting: Hematology

## 2017-10-17 NOTE — Telephone Encounter (Signed)
Per MD just keep 8/23 appointment no need to reschedule 8/9

## 2017-10-27 ENCOUNTER — Inpatient Hospital Stay (HOSPITAL_BASED_OUTPATIENT_CLINIC_OR_DEPARTMENT_OTHER): Payer: Medicare Other | Admitting: Nurse Practitioner

## 2017-10-27 ENCOUNTER — Inpatient Hospital Stay: Payer: Medicare Other

## 2017-10-27 ENCOUNTER — Encounter: Payer: Self-pay | Admitting: Nurse Practitioner

## 2017-10-27 ENCOUNTER — Telehealth: Payer: Self-pay | Admitting: Nurse Practitioner

## 2017-10-27 VITALS — BP 150/79 | HR 76 | Temp 97.7°F | Resp 18 | Ht 66.0 in | Wt 113.7 lb

## 2017-10-27 DIAGNOSIS — I129 Hypertensive chronic kidney disease with stage 1 through stage 4 chronic kidney disease, or unspecified chronic kidney disease: Secondary | ICD-10-CM | POA: Diagnosis not present

## 2017-10-27 DIAGNOSIS — C180 Malignant neoplasm of cecum: Secondary | ICD-10-CM

## 2017-10-27 DIAGNOSIS — C7989 Secondary malignant neoplasm of other specified sites: Secondary | ICD-10-CM

## 2017-10-27 DIAGNOSIS — C182 Malignant neoplasm of ascending colon: Secondary | ICD-10-CM

## 2017-10-27 DIAGNOSIS — M7989 Other specified soft tissue disorders: Secondary | ICD-10-CM | POA: Diagnosis not present

## 2017-10-27 DIAGNOSIS — Z87891 Personal history of nicotine dependence: Secondary | ICD-10-CM

## 2017-10-27 DIAGNOSIS — J439 Emphysema, unspecified: Secondary | ICD-10-CM

## 2017-10-27 DIAGNOSIS — Z5112 Encounter for antineoplastic immunotherapy: Secondary | ICD-10-CM | POA: Diagnosis not present

## 2017-10-27 DIAGNOSIS — N183 Chronic kidney disease, stage 3 (moderate): Secondary | ICD-10-CM

## 2017-10-27 DIAGNOSIS — C787 Secondary malignant neoplasm of liver and intrahepatic bile duct: Secondary | ICD-10-CM | POA: Diagnosis not present

## 2017-10-27 LAB — COMPREHENSIVE METABOLIC PANEL
ALBUMIN: 3.2 g/dL — AB (ref 3.5–5.0)
ALT: 12 U/L (ref 0–44)
ANION GAP: 7 (ref 5–15)
AST: 17 U/L (ref 15–41)
Alkaline Phosphatase: 72 U/L (ref 38–126)
BUN: 16 mg/dL (ref 8–23)
CALCIUM: 8.9 mg/dL (ref 8.9–10.3)
CO2: 27 mmol/L (ref 22–32)
Chloride: 106 mmol/L (ref 98–111)
Creatinine, Ser: 1.3 mg/dL — ABNORMAL HIGH (ref 0.44–1.00)
GFR calc Af Amer: 45 mL/min — ABNORMAL LOW (ref >60)
GFR calc non Af Amer: 39 mL/min — ABNORMAL LOW (ref >60)
GLUCOSE: 85 mg/dL (ref 70–99)
Potassium: 4.1 mmol/L (ref 3.5–5.1)
Sodium: 140 mmol/L (ref 135–145)
Total Bilirubin: 0.4 mg/dL (ref 0.3–1.2)
Total Protein: 6.6 g/dL (ref 6.5–8.1)

## 2017-10-27 LAB — CBC WITH DIFFERENTIAL/PLATELET
BASOS PCT: 1 %
Basophils Absolute: 0.1 10*3/uL (ref 0.0–0.1)
Eosinophils Absolute: 0.3 10*3/uL (ref 0.0–0.5)
Eosinophils Relative: 4 %
HEMATOCRIT: 33 % — AB (ref 34.8–46.6)
HEMOGLOBIN: 10.7 g/dL — AB (ref 11.6–15.9)
LYMPHS ABS: 1.2 10*3/uL (ref 0.9–3.3)
LYMPHS PCT: 16 %
MCH: 29.1 pg (ref 25.1–34.0)
MCHC: 32.4 g/dL (ref 31.5–36.0)
MCV: 89.7 fL (ref 79.5–101.0)
MONOS PCT: 7 %
Monocytes Absolute: 0.5 10*3/uL (ref 0.1–0.9)
NEUTROS PCT: 72 %
Neutro Abs: 5.1 10*3/uL (ref 1.5–6.5)
Platelets: 319 10*3/uL (ref 145–400)
RBC: 3.68 MIL/uL — ABNORMAL LOW (ref 3.70–5.45)
RDW: 18.8 % — ABNORMAL HIGH (ref 11.2–14.5)
WBC: 7.2 10*3/uL (ref 3.9–10.3)

## 2017-10-27 LAB — CEA (IN HOUSE-CHCC): CEA (CHCC-In House): 40.96 ng/mL — ABNORMAL HIGH (ref 0.00–5.00)

## 2017-10-27 LAB — TOTAL PROTEIN, URINE DIPSTICK: Protein, ur: 100 mg/dL — AB

## 2017-10-27 MED ORDER — PALONOSETRON HCL INJECTION 0.25 MG/5ML
INTRAVENOUS | Status: AC
  Filled 2017-10-27: qty 5

## 2017-10-27 MED ORDER — SODIUM CHLORIDE 0.9% FLUSH
10.0000 mL | Freq: Once | INTRAVENOUS | Status: AC
  Administered 2017-10-27: 10 mL
  Filled 2017-10-27: qty 10

## 2017-10-27 MED ORDER — PALONOSETRON HCL INJECTION 0.25 MG/5ML
0.2500 mg | Freq: Once | INTRAVENOUS | Status: AC
  Administered 2017-10-27: 0.25 mg via INTRAVENOUS

## 2017-10-27 MED ORDER — SODIUM CHLORIDE 0.9 % IV SOLN
Freq: Once | INTRAVENOUS | Status: AC
  Administered 2017-10-27: 11:00:00 via INTRAVENOUS
  Filled 2017-10-27: qty 250

## 2017-10-27 MED ORDER — DEXAMETHASONE SODIUM PHOSPHATE 10 MG/ML IJ SOLN
INTRAMUSCULAR | Status: AC
  Filled 2017-10-27: qty 1

## 2017-10-27 MED ORDER — SODIUM CHLORIDE 0.9 % IV SOLN
5.0000 mg/kg | Freq: Once | INTRAVENOUS | Status: AC
  Administered 2017-10-27: 275 mg via INTRAVENOUS
  Filled 2017-10-27: qty 11

## 2017-10-27 MED ORDER — ATROPINE SULFATE 1 MG/ML IJ SOLN
0.5000 mg | Freq: Once | INTRAMUSCULAR | Status: AC | PRN
  Administered 2017-10-27: 0.5 mg via INTRAVENOUS

## 2017-10-27 MED ORDER — ATROPINE SULFATE 1 MG/ML IJ SOLN
INTRAMUSCULAR | Status: AC
  Filled 2017-10-27: qty 1

## 2017-10-27 MED ORDER — IRINOTECAN HCL CHEMO INJECTION 100 MG/5ML
150.0000 mg/m2 | Freq: Once | INTRAVENOUS | Status: AC
  Administered 2017-10-27: 240 mg via INTRAVENOUS
  Filled 2017-10-27: qty 12

## 2017-10-27 MED ORDER — SODIUM CHLORIDE 0.9% FLUSH
10.0000 mL | INTRAVENOUS | Status: DC | PRN
  Administered 2017-10-27: 10 mL
  Filled 2017-10-27: qty 10

## 2017-10-27 MED ORDER — HEPARIN SOD (PORK) LOCK FLUSH 100 UNIT/ML IV SOLN
500.0000 [IU] | Freq: Once | INTRAVENOUS | Status: AC | PRN
  Administered 2017-10-27: 500 [IU]
  Filled 2017-10-27: qty 5

## 2017-10-27 MED ORDER — DEXAMETHASONE SODIUM PHOSPHATE 10 MG/ML IJ SOLN
10.0000 mg | Freq: Once | INTRAMUSCULAR | Status: AC
  Administered 2017-10-27: 10 mg via INTRAVENOUS

## 2017-10-27 NOTE — Progress Notes (Signed)
[place]  [phone] [phone]  Clinic Follow up Note   Patient Care Team: [name], MD as PCP - General (Family Medicine) [name], MD as Consulting Physician (Medical Oncology) [name], MD as Consulting Physician (General Surgery) [name], MD as Consulting Physician (Orthopedic Surgery) [name] [name], MD as Consulting Physician (Urology) Date of service: 10/27/17   SUMMARY OF ONCOLOGIC HISTORY: Oncology History   Cancer of right colon [facility])   Staging form: Colon and Rectum, AJCC 7th Edition     Pathologic stage from 01/23/2015: Stage IIC (T4b, N0, cM0) - Signed by [name], MD on 02/05/2015       Cancer of right colon ([place])   01/21/2015 Imaging     CT abdomen and pelvis showed high-grade small bowel obstruction,  right I [place] ureteral nephrosis,  multiple liver cysts.  CT chest was negative for metastatic lesion.    01/23/2015 Pathology Results     invasive adenocarcinoma extending into the pericecal connective tissue,  grade 2, LVI (-),  no perineural invasion, 3 lymph nodes were negative.    01/23/2015 Surgery      terminal ileum and cecum seegmental resection with anastomosis by Dr. Hassell Done    01/23/2015 Initial Diagnosis    Cancer of right colon ([place])    03/13/2015 - 07/06/2015 Chemotherapy    Xeloda 1500 mg bid X 14 days on-7 off, s/p 5 cycles, stopped early due to wrosening renal function    10/28/2015 Progression    PET scan showed multiple hypermetabolic peritoneal nodules, right ovarian cystic mass, and hypermetabolic liver lesion, highly suspicious for metastatic colon cancer. Small pulmonary nodules are indeterminate.     11/11/2015 - 05/20/2016 Chemotherapy    Xeloda '1500mg'$  bid, 2 weeks on and one week off, Avastin was added on from cycle 3. Xeloda held from 05/20/16 due to progression on CT from 05/19/16 and total bilirubin increased to 2.6.    01/08/2016 Imaging    Ct chest, Abdomen Pelvis Wo  Contrast 01/08/2016  IMPRESSION: 1. 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 2. Hepatic and renal cysts. 3.  Aortoiliac atherosclerotic vascular disease. 4. Lumbar spondylosis and degenerative disc disease with multilevel Impingement.    05/19/2016 Imaging    CT CAP w Contrast IMPRESSION: 1. Numerous small solid pulmonary nodules, one of which is new, and several of which have mildly increased in size since 10/28/2015, worrisome for mild progression of pulmonary metastases. 2. Solitary right liver lobe metastasis is mildly increased in size since 01/08/2016. 3. Peritoneal metastases have mildly increased in size since 01/08/2016, largest in the right pelvic sidewall. 4. Well-positioned right nephroureteral stent without overt right hydronephrosis. 5. Additional findings include aortic atherosclerosis, 1 vessel coronary atherosclerosis and mild to moderate emphysema.    06/03/2016 -  Chemotherapy    Second-Line Irinotecan and Avastin every 2 weeks starting 06/03/16    07/29/2016 Tumor Marker    CEA 6.51     08/09/2016 Imaging    PET  IMPRESSION: 1. Overall mild improvement compared to FDG PET scan of 10/28/2015. 2. Pulmonary nodules are increased in size from PET-CT scan and similar size to most recent CT scan. Largest lesion RIGHT lower lobe does have associated faint metabolic activity consistent with pulmonary metastasis. 3. Decrease in metabolic activity of RIGHT hepatic lobe lesion. Potential second lesion LEFT hepatic lobe with slightly lower metabolic activity. 4. Decreased metabolic activity at the enteric colonic anastomosis in the RIGHT lower quadrant.  5. Interval decrease in metabolic activity of peritoneal nodular metastasis. 6. Nodular implant implant along ventral abdominal wall may be within the musculature, but also improved.    09/09/2016 Tumor Marker    CEA: 7.47    10/07/2016 Tumor Marker     CEA: 7.85    11/04/2016 Tumor Marker    CEA: 7.75    11/25/2016 PET scan    PET 11/25/16 IMPRESSION: 1. Interval worsening in the interval. Numerous pulmonary nodules are again seen. While multiple nodules are stable, there are several nodules which are a little larger in the interval. They remain too small to completely characterize with PET-CT. The 2 hepatic metastases demonstrate increased FDG uptake on today's study compared to the previous study. Abdominal wall and peritoneal metastases are mildly worsened.    12/02/2016 Tumor Marker    CEA: 8.13    01/06/2017 Tumor Marker    CEA: 10.36    02/03/2017 Tumor Marker    CEA: 12.60    03/03/2017 Tumor Marker    CEA: 9.96    03/13/2017 PET scan    IMPRESSION: 1. Hepatic metastases are stable in size with stable to minimally decreased hypermetabolism. 2. Inferior left rectus abdominus metastasis shows mildly decreased hypermetabolism. 3. Omental nodule no longer shows abnormal hypermetabolism. 4. Scattered pulmonary nodules are stable in size and too small for PET resolution. 5.  Aortic atherosclerosis (ICD10-170.0). 6.  Emphysema (ICD10-J43.9).    07/03/2017 Imaging    PET, 07/03/2017 IMPRESSION: 1. General mild worsening, with increased activity in the hepatic metastatic lesions, mesenteric and omental nodularity, and with new/worsening metastatic nodularity in the right lower quadrant below the cecum. 2. There are over 20 small pulmonary nodules which are below sensitive PET-CT size thresholds and stable from the prior exam, but not currently hypermetabolic. 3. Other imaging findings of potential clinical significance: Aortic Atherosclerosis (ICD10-I70.0) and Emphysema (ICD10-J43.9). Coronary atherosclerosis. Uterine fibroid. Small amount of free pelvic fluid in the cul-de-sac. Old pelvic deformities from prior fractures. Degenerative hip and glenohumeral arthropathy. Spondylosis.    09/27/2017 PET scan    IMPRESSION: 1.  Relatively stable exam with no clear trend towards worsening disease or generalized improvement. 2. Scattered tiny bilateral pulmonary nodules without substantial interval change. 3. Index hypermetabolic lesions in the liver, omentum, right lower quadrant mesentery, and anterior abdominal wall show similar hypermetabolism to the prior study. 4.  Aortic Atherosclerois (ICD10-170.0) 5.  Emphysema. (MLY65-K35.9)    CURRENT THERAPY: Second-Line Irinotecan and Avastin every 2 weeks started 06/03/16, Avastin held as needed due to proteinuria  INTERVAL HISTORY: Ms. Heard returns for follow up and continuation of irinotecan/avastin. She completed last cycle on 09/29/17. She missed her last appointment due to fatigue after ureteral stent replacement on 10/09/17. She completed cipro. She is doing well lately. Appetite and energy level are good. Behavior and cognitive function are at baseline. She is active with her caregiver 3 days per week. Constipation is managed with miralax and "smooth move" tea. No blood in stool. Patient's daughter reports 2 week history of intermittent right foot swelling. No swelling in leg or calf pain. Daughter also notes increased morning "breathiness" that she also notes during times of anxiety. Denies cough, chest pain. No recent fever, chills, nausea, or vomiting.    MEDICAL HISTORY:  Past Medical History:  Diagnosis Date  . Anxiety   . Aortic atherosclerosis ([place]) 03/15/2017   noted on PET   . Arthritis   . Bilateral renal cysts   . Centrilobular emphysema ([place]) per 05-10-16 chest  xray  . CKD (chronic kidney disease), stage III ([place])   . Colon cancer [facility]) oncologist-  dr [name]   dx 01-23-2015  right colon Invasive adenocarcinoma into pericecal connective tissue and involving small intestine , Stage IIc, Grade 2, LVI(-),  (pT4b N0 M0) /  s/p resection termial ileum and cecum and chemotherapy 01-06-217 to 07-06-2015/   08/ 2017  per PET  recurrent w/ liver mets and  peritoneal carcinomatosis  . Complication of anesthesia    can be combative due to dementia  . Full dentures   . Headache    occ stress related  . History of chemotherapy 2019   last chemotherapy infusion 09/29/2017  . History of gastroesophageal reflux (GERD)    changes diet  . History of pelvic fracture 2015  . Hyperlipidemia   . Hypertension   . Liver metastasis ([place])   . 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 N&V (nausea and vomiting)   . Pulmonary nodules   . Right ovarian cyst   . SBO (small bowel obstruction) ([place]) 01/2015  . 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 UTI (urinary tract infection) 09/26/2016   5 day antibiotic treatment    SURGICAL HISTORY: Past Surgical History:  Procedure Laterality Date  . CATARACT EXTRACTION W/ INTRAOCULAR LENS  IMPLANT, BILATERAL  2005 approx  . CYSTOSCOPY W/ URETERAL STENT PLACEMENT Right 12/03/2015   Procedure: CYSTOSCOPY WITH RETROGRADE PYELOGRAM/URETERAL STENT PLACEMENT;  Surgeon: Cleon Gustin, MD;  Location: [facility];  Service: Urology;  Laterality: Right;  . CYSTOSCOPY W/ URETERAL STENT PLACEMENT Right 02/15/2016   Procedure: CYSTOSCOPY WITH RETROGRADE PYELOGRAM/URETERAL STENT REPLACEMENT;  Surgeon: Cleon Gustin, MD;  Location[facility];  Service: Urology;  Laterality: Right;  . CYSTOSCOPY W/ URETERAL STENT PLACEMENT Right 03/28/2016   Procedure: CYSTOSCOPY WITH RETROGRADE PYELOGRAM/URETERAL STENT EXCHANGE;  Surgeon: Cleon Gustin, MD;  Location: [facility];  Service: Urology;  Laterality: Right;  . CYSTOSCOPY W/ URETERAL STENT PLACEMENT Right 07/18/2016   Procedure: CYSTOSCOPY WITH RETROGRADE PYELOGRAM/ STENT EXCHANGE;  Surgeon: Cleon Gustin, MD;  Location: [facility];  Service: Urology;  Laterality: Right;  . CYSTOSCOPY W/ URETERAL STENT PLACEMENT Right 10/10/2016   Procedure: CYSTOSCOPY WITH RETROGRADE PYELOGRAM/URETERAL STENT REPLACEMENT;  Surgeon: Cleon Gustin, MD;  Location: [facility];  Service: Urology;  Laterality: Right;  . CYSTOSCOPY W/ URETERAL STENT PLACEMENT Right 04/10/2017   Procedure: CYSTOSCOPY WITH RETROGRADE PYELOGRAM/URETERAL STENT EXCHANGE;  Surgeon: Cleon Gustin, MD;  Location: [facility];  Service: Urology;  Laterality: Right;  . CYSTOSCOPY W/ URETERAL STENT PLACEMENT Right 10/09/2017   Procedure: CYSTOSCOPY WITH RETROGRADE PYELOGRAM/URETERAL STENT PLACEMENT;  Surgeon: Cleon Gustin, MD;  Location: [facility];  Service: Urology;  Laterality: Right;  30 MINS  . CYSTOSCOPY WITH RETROGRADE PYELOGRAM, URETEROSCOPY AND STENT PLACEMENT Right 09/21/2015   Procedure: CYSTOSCOPY WITH BIL RETROGRADE PYELOGRAM, URETEROSCOPY, RIGHT STENT PLACEMENT ;  Surgeon: Cleon Gustin, MD;  Location: [facility];  Service: Urology;  Laterality: Right;  . HEMICOLECTOMY Right 01/23/2015   Dr. Hassell Done  . IR FLUORO GUIDE PORT INSERTION RIGHT  03/24/2017  . IR US GUIDE VASC ACCESS RIGHT  03/24/2017  . LAPAROSCOPY N/A 01/23/2015   Procedure: DIAGNOSTIC LAPAROSCOPY, EXPLORATORY LAPAROTOMY  RIGHT HEMI-COLECTOMY FOR OBSTRUCTION OF RIGHT TERMINAL ILEUM;  Surgeon: [name], MD;  Location: [facility];  Service: General;  Laterality: N/A;  . PORTACATH PLACEMENT      I have reviewed the social history and family history with the patient and they are unchanged from previous note.  ALLERGIES:  is allergic to namenda [memantine hcl] and oxycodone.  MEDICATIONS:  Current Outpatient Medications  Medication Sig Dispense Refill  . amLODipine (NORVASC) 5 MG tablet Take 5 mg by mouth every morning.     Marland Kitchen aspirin 81 MG tablet Take 81 mg by mouth daily.     . Cholecalciferol (VITAMIN  D3) 2000 UNITS TABS Take by mouth daily.    . Coenzyme Q10 (CO Q 10) 100 MG CAPS Take by mouth daily.    Marland Kitchen lidocaine-prilocaine (EMLA) cream Apply 1 application topically as needed. 30 g 5  . medium chain triglycerides (MCT OIL) oil Take 5 mLs by mouth 2 (two) times daily.     . Melatonin 5 MG TABS Take 5-15 mg by mouth at bedtime as needed.    . Meth-Hyo-M Bl-Na Phos-Ph Sal (URIBEL) 118 MG CAPS Take 1 capsule (118 mg total) by mouth 2 (two) times daily as needed. 30 capsule 3  . mirabegron ER (MYRBETRIQ) 50 MG TB24 tablet Take 1 tablet (50 mg total) by mouth daily. 30 tablet 11  . mirtazapine (REMERON) 15 MG tablet TAKE 1 TABLET BY MOUTH AT BEDTIME 30 tablet 3  . Multiple Vitamins-Minerals (WOMENS MULTIVITAMIN PO) Take 1 tablet by mouth daily. With iron    . polyethylene glycol (MIRALAX / GLYCOLAX) packet Take 17 g by mouth daily. (Patient taking differently: Take 17 g by mouth daily as needed. ) 14 each 0  . prochlorperazine (COMPAZINE) 5 MG tablet Take 1 tablet (5 mg total) by mouth every 6 (six) hours as needed for nausea or vomiting. 30 tablet 0  . QUEtiapine (SEROQUEL) 25 MG tablet Take 1 tablet each night 30 tablet 6  . traMADol (ULTRAM) 50 MG tablet Take 1 tablet (50 mg total) by mouth every 6 (six) hours as needed. 30 tablet 0  . UNABLE TO FIND Med Name: Closys Mouth Rinse two-three times daily for ulcer.    . vitamin B-12 (CYANOCOBALAMIN) 1000 MCG tablet Take 1,000 mcg by mouth daily.    . ciprofloxacin (CIPRO) 250 MG tablet Take 250 mg by mouth 2 (two) times daily.     No current facility-administered medications for this visit.    Facility-Administered Medications Ordered in Other Visits  Medication Dose Route Frequency Provider Last Rate Last Dose  . heparin lock flush 100 unit/mL  500 Units Intravenous Once [name], MD        PHYSICAL EXAMINATION: ECOG PERFORMANCE STATUS: 1-2  Vitals:   10/27/17 0953  BP: (!) 150/79  Pulse: 76  Resp: 18  Temp: 97.7 F (36.5 C)  SpO2:  100%   Filed Weights   10/27/17 0953  Weight: 113 lb 11.2 oz (51.6 kg)    GENERAL:alert, no distress and comfortable SKIN: skin color, texture, turgor are normal, no rashes or significant lesions EYES: sclera clear OROPHARYNX:no thrush or ulcers  LYMPH:  no palpable cervical or supraclavicular lymphadenopathy  LUNGS: clear to auscultation with normal breathing effort HEART: regular rate & rhythm, no lower extremity edema ABDOMEN:abdomen soft, non-tender and normal bowel sounds Musculoskeletal:no cyanosis of digits and no clubbing  NEURO: alert & oriented x 3 with fluent speech PAC without erythema   LABORATORY DATA:  I have reviewed the data as listed CBC Latest Ref Rng & Units 10/27/2017 10/09/2017 09/29/2017  WBC 3.9 - 10.3 K/uL 7.2 - 8.0  Hemoglobin 11.6 - 15.9 g/dL 10.7(L) 11.6(L) 10.6(L)  Hematocrit 34.8 - 46.6 % 33.0(L) 34.0(L) 33.5(L)  Platelets 145 - 400 K/uL 319 - 337     CMP Latest Ref Rng & Units 10/27/2017 10/09/2017 09/29/2017  Glucose 70 - 99 mg/dL 85 86 90  BUN 8 - 23 mg/dL _0 Creatinine 0.44 - 1.00 mg/dL 1.30(H) 1.40(H) 1.16(H)  Sodium 135 - 145 mmol/L 140 143 136  Potassium 3.5 - 5.1 mmol/L 4.1 4.1 4.1  Chloride 98 - 111 mmol/L 106 109 104  CO2 22 - 32 mmol/L 27 - 25  Calcium 8.9 - 10.3 mg/dL 8.9 - 8.8(L)  Total Protein 6.5 - 8.1 g/dL 6.6 - 7.0  Total Bilirubin 0.3 - 1.2 mg/dL 0.4 - 0.3  Alkaline Phos 38 - 126 U/L 72 - 76  AST 15 - 41 U/L 17 - 19  ALT 0 - 44 U/L 12 - 11   CEA  01/23/2015: 5.7 06/05/2015: 6.1 08/04/2015: 10.3 10/12/2015: 13.29 (in-house)  11/04/2015: 12.83 01/13/2016: 8.57 02/24/2016: 8.41 04/27/2016: 9.22 05/20/2016: 8.32 06/17/2016: 6.45 07/15/2016: 5.07 07/29/2016: 6.51 09/09/16: 7.47 10/07/16: 7.85 11/04/16: 7.75 12/02/16: 8.13 01/06/17: 10.36 02/03/17: 12.60 03/03/18: 9.96  03/31/17: 12.7 04/14/17: 11.93 05/12/17: 13.00 06/09/17: 14.97 07/07/17: 15.22 08/04/17: 17.26 09/01/17:21.94 09/29/17: 25.89 10/27/17: 40.96  PATHOLOGY  REPORT: Diagnosis 01/23/2015 Colon, segmental resection, with terminal ileum and cecum - INVASIVE ADENOCARCINOMA EXTENDING INTO PERICECAL CONNECTIVE TISSUE AND INVOLVING SMALL INTESTINE. - THREE BENIGN LYMPH NODES (0/3). - SEE ONCOLOGY TABLE BELOW.  Microscopic Comment COLON AND RECTUM (INCLUDING TRANS-ANAL RESECTION): Specimen: Terminal ileum and cecum. Procedure: Resection. Tumor site: Cecum adjacent to appendix. Specimen integrity: Intact. Macroscopic intactness of mesorectum: Not applicable. Macroscopic tumor perforation: No. Invasive tumor: Maximum size: 4.0 cm. Histologic type(s): Colorectal adenocarcinoma. Histologic grade and differentiation: G2: moderately differentiated/low grade. Type of polyp in which invasive carcinoma arose: No residual polyp. Microscopic extension of invasive tumor: Into pericecal connective tissue and terminal ileum. Lymph-Vascular invasion: Not identified. Peri-neural invasion: Not identified. Tumor deposit(s) (discontinuous extramural extension): No. Resection margins: Proximal margin: Free of tumor. Distal margin: Free of tumor. Circumferential (radial) (posterior ascending, posterior descending; lateral and posterior mid-rectum; and entire lower 1/3 rectum): Free of tumor. Mesenteric margin (sigmoid and transverse): N/A. Distance closest margin (if all above margins negative): N/A. Treatment effect (neo-adjuvant therapy): No. Additional polyp(s): No. Non-neoplastic findings: N/A. Lymph nodes: number examined 3; number positive: 0. Pathologic Staging: pT4b, pN0, pMX. Ancillary studies: Mismatch repair protein by immunohistochemistry pending. Comments: There is a colorectal-type adenocarcinoma arising in the cecum in the area of the appendiceal orifice. The tumor extends into the pericecal connective tissue, and involves the attached segment of terminal ileum. Immunohistochemistry shows the tumor is positive with CDX2 and cytokeratin 20, and  negative with cytokeratin 7, estrogen receptor and WT-1 supporting a diagnosis of primary colorectal adenocarcinoma. ADDITIONAL INFORMATION: Mismatch Repair (MMR) Protein Immunohistochemistry (IHC) IHC Expression Result: MLH1: Preserved nuclear expression (greater 50% tumor expression) MSH2: Preserved nuclear expression (greater 50% tumor expression) MSH6: Preserved nuclear expression (greater 50% tumor expression) PMS2: Preserved nuclear expression (greater 50% tumor expression) * Internal control demonstrates intact nuclear expression Interpretation: NORMAL There is preserved expression of the major and minor MMR proteins. There is a very low probability that microsatellite instability (MSI) is present. However, certain clinically significant MMR protein mutations may result in preservation of nuclear  expression. It is recommended that the preservation of protein expression be correlated with molecular based MSI testing.       RADIOGRAPHIC STUDIES: I have personally reviewed the radiological images as listed and agreed with the findings in the report. No results found.   ASSESSMENT & PLAN: 77 y.o.African-American female, with past medical history of moderate dementia, independent with ADLs, hypertension, presents with small bowel obstruction and weight loss.  1. 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  CKD stage III, right hydroureter 3.  HTN, dementia 4.  Goal of care discussion 5.  Insomnia 6.  Social support  Ms. Barcenas appears stable. She completed another cycle of irinotecan and avastin. She continues to tolerate treatment well. She has no appreciable lower extremity today, but her daughter reports right foot swelling. I have low suspicion for blood clot, but given her underlying malignancy and avastin, will obtain doppler to r/o DVT. I recommend she elevate her legs, and avoid  being sedentary for prolonged periods. Dyspnea could be related to emphysema, she is a former smoker.   Labs reviewed, CBC CMP and urine protein adequate to proceed with next cycle irinotecan and avastin today, continue q2 weeks. CEA is trending up slightly. Will monitor. She will return for follow up and next cycle in 2 weeks.   PLAN: -Labs reviewed, proceed with next cycle irinotecan and avastin -Return in 2 weeks for f/u and next cycle  -Doppler to r/o DVT   All questions were answered. The patient knows to call the clinic with any problems, questions or concerns. No barriers to learning was detected. I spent 20 minutes counseling the patient face to face. The total time spent in the appointment was 25 minutes and more than 50% was on counseling and review of test results     Alla Feeling, NP 10/28/17

## 2017-10-27 NOTE — Patient Instructions (Signed)
[facility] Discharge Instructions for Patients Receiving Chemotherapy  Today you received the following chemotherapy agents :  Avastin,  Irinotecan.  To help prevent nausea and vomiting after your treatment, we encourage you to take your nausea medication as prescribed.   If you develop nausea and vomiting that is not controlled by your nausea medication, call the clinic.   BELOW ARE SYMPTOMS THAT SHOULD BE REPORTED IMMEDIATELY:  *FEVER GREATER THAN 100.5 F  *CHILLS WITH OR WITHOUT FEVER  NAUSEA AND VOMITING THAT IS NOT CONTROLLED WITH YOUR NAUSEA MEDICATION  *UNUSUAL SHORTNESS OF BREATH  *UNUSUAL BRUISING OR BLEEDING  TENDERNESS IN MOUTH AND THROAT WITH OR WITHOUT PRESENCE OF ULCERS  *URINARY PROBLEMS  *BOWEL PROBLEMS  UNUSUAL RASH Items with * indicate a potential emergency and should be followed up as soon as possible.  Feel free to call the clinic should you have any questions or concerns. The clinic phone number is [phone].  Please show the [place] at check-in to the Emergency Department and triage nurse.

## 2017-10-27 NOTE — Progress Notes (Signed)
Per Regan Rakers, NP ok to treat with urine protein 100.

## 2017-10-27 NOTE — Telephone Encounter (Signed)
Scheduled appt for 9/6 and 9/20 unable to complete scheduling due to cap - logged and will contact patient when appts added.

## 2017-10-28 ENCOUNTER — Encounter: Payer: Self-pay | Admitting: Nurse Practitioner

## 2017-10-31 MED FILL — AMLODIPINE BESYLATE 5 MG TA: 5 | 30 days supply | Qty: 30 | Fill #5

## 2017-11-01 MED FILL — LIDOCAINE-PRILOCAINE CREAM: 2.5-2.5 | 30 days supply | Qty: 30 | Fill #3

## 2017-11-07 ENCOUNTER — Encounter (HOSPITAL_COMMUNITY): Payer: Medicare Other

## 2017-11-07 ENCOUNTER — Ambulatory Visit (HOSPITAL_COMMUNITY)
Admission: RE | Admit: 2017-11-07 | Discharge: 2017-11-07 | Disposition: A | Discharge status: Home / Self Care | Payer: Medicare Other | Source: Ambulatory Visit | Attending: Nurse Practitioner | Admitting: Nurse Practitioner

## 2017-11-07 DIAGNOSIS — M7989 Other specified soft tissue disorders: Secondary | ICD-10-CM | POA: Diagnosis present

## 2017-11-07 DIAGNOSIS — C182 Malignant neoplasm of ascending colon: Secondary | ICD-10-CM | POA: Insufficient documentation

## 2017-11-07 NOTE — Progress Notes (Signed)
*  Preliminary Results* Right lower extremity venous duplex completed. Right lower extremity is negative for deep vein thrombosis. There is no evidence of right Baker's cyst.  11/07/2017 10:39 AM  Abram Sander

## 2017-11-08 NOTE — Progress Notes (Signed)
[place]  [phone] [phone]  Clinic Follow Up Note   Patient Care Team: [name], MD as PCP - General (Family Medicine) [name], MD as Consulting Physician (Medical Oncology) [name], MD as Consulting Physician (General Surgery) [name], MD as Consulting Physician (Orthopedic Surgery) [name] [name], MD as Consulting Physician (Urology)   Date of Service:  11/10/2017   CHIEF COMPLAINTS:  Follow up metastatic colon cancer  Oncology History   Cancer of right colon [facility])   Staging form: Colon and Rectum, AJCC 7th Edition     Pathologic stage from 01/23/2015: Stage IIC (T4b, N0, cM0) - Signed by [name], MD on 02/05/2015       Cancer of right colon ([place])   01/21/2015 Imaging     CT abdomen and pelvis showed high-grade small bowel obstruction,  right I [place] ureteral nephrosis,  multiple liver cysts.  CT chest was negative for metastatic lesion.    01/23/2015 Pathology Results     invasive adenocarcinoma extending into the pericecal connective tissue,  grade 2, LVI (-),  no perineural invasion, 3 lymph nodes were negative.    01/23/2015 Surgery      terminal ileum and cecum seegmental resection with anastomosis by Dr. Hassell Done    01/23/2015 Initial Diagnosis    Cancer of right colon ([place])    03/13/2015 - 07/06/2015 Chemotherapy    Xeloda 1500 mg bid X 14 days on-7 off, s/p 5 cycles, stopped early due to wrosening renal function    10/28/2015 Progression    PET scan showed multiple hypermetabolic peritoneal nodules, right ovarian cystic mass, and hypermetabolic liver lesion, highly suspicious for metastatic colon cancer. Small pulmonary nodules are indeterminate.     11/11/2015 - 05/20/2016 Chemotherapy    Xeloda 1580m bid, 2 weeks on and one week off, Avastin was added on from cycle 3. Xeloda held from 05/20/16 due to progression on CT from 05/19/16 and total bilirubin increased to 2.6.    01/08/2016 Imaging    Ct  chest, Abdomen Pelvis Wo Contrast 01/08/2016  IMPRESSION: 1. 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 2. Hepatic and renal cysts. 3.  Aortoiliac atherosclerotic vascular disease. 4. Lumbar spondylosis and degenerative disc disease with multilevel Impingement.    05/19/2016 Imaging    CT CAP w Contrast IMPRESSION: 1. Numerous small solid pulmonary nodules, one of which is new, and several of which have mildly increased in size since 10/28/2015, worrisome for mild progression of pulmonary metastases. 2. Solitary right liver lobe metastasis is mildly increased in size since 01/08/2016. 3. Peritoneal metastases have mildly increased in size since 01/08/2016, largest in the right pelvic sidewall. 4. Well-positioned right nephroureteral stent without overt right hydronephrosis. 5. Additional findings include aortic atherosclerosis, 1 vessel coronary atherosclerosis and mild to moderate emphysema.    06/03/2016 -  Chemotherapy    Second-Line Irinotecan and Avastin every 2 weeks starting 06/03/16    07/29/2016 Tumor Marker    CEA 6.51     08/09/2016 Imaging    PET  IMPRESSION: 1. Overall mild improvement compared to FDG PET scan of 10/28/2015. 2. Pulmonary nodules are increased in size from PET-CT scan and similar size to most recent CT scan. Largest lesion RIGHT lower lobe does have associated faint metabolic activity consistent with pulmonary metastasis. 3. Decrease in metabolic activity of RIGHT hepatic lobe lesion. Potential second lesion LEFT hepatic lobe with slightly lower metabolic activity. 4. Decreased metabolic activity at the  enteric colonic anastomosis in the RIGHT lower quadrant. 5. Interval decrease in metabolic activity of peritoneal nodular metastasis. 6. Nodular implant implant along ventral abdominal wall may be within the musculature, but also improved.    09/09/2016 Tumor Marker    CEA: 7.47     10/07/2016 Tumor Marker    CEA: 7.85    11/04/2016 Tumor Marker    CEA: 7.75    11/25/2016 PET scan    PET 11/25/16 IMPRESSION: 1. Interval worsening in the interval. Numerous pulmonary nodules are again seen. While multiple nodules are stable, there are several nodules which are a little larger in the interval. They remain too small to completely characterize with PET-CT. The 2 hepatic metastases demonstrate increased FDG uptake on today's study compared to the previous study. Abdominal wall and peritoneal metastases are mildly worsened.    12/02/2016 Tumor Marker    CEA: 8.13    01/06/2017 Tumor Marker    CEA: 10.36    02/03/2017 Tumor Marker    CEA: 12.60    03/03/2017 Tumor Marker    CEA: 9.96    03/13/2017 PET scan    IMPRESSION: 1. Hepatic metastases are stable in size with stable to minimally decreased hypermetabolism. 2. Inferior left rectus abdominus metastasis shows mildly decreased hypermetabolism. 3. Omental nodule no longer shows abnormal hypermetabolism. 4. Scattered pulmonary nodules are stable in size and too small for PET resolution. 5.  Aortic atherosclerosis (ICD10-170.0). 6.  Emphysema (ICD10-J43.9).    07/03/2017 Imaging    PET, 07/03/2017 IMPRESSION: 1. General mild worsening, with increased activity in the hepatic metastatic lesions, mesenteric and omental nodularity, and with new/worsening metastatic nodularity in the right lower quadrant below the cecum. 2. There are over 20 small pulmonary nodules which are below sensitive PET-CT size thresholds and stable from the prior exam, but not currently hypermetabolic. 3. Other imaging findings of potential clinical significance: Aortic Atherosclerosis (ICD10-I70.0) and Emphysema (ICD10-J43.9). Coronary atherosclerosis. Uterine fibroid. Small amount of free pelvic fluid in the cul-de-sac. Old pelvic deformities from prior fractures. Degenerative hip and glenohumeral arthropathy. Spondylosis.    09/27/2017 PET scan     IMPRESSION: 1. Relatively stable exam with no clear trend towards worsening disease or generalized improvement. 2. Scattered tiny bilateral pulmonary nodules without substantial interval change. 3. Index hypermetabolic lesions in the liver, omentum, right lower quadrant mesentery, and anterior abdominal wall show similar hypermetabolism to the prior study. 4.  Aortic Atherosclerois (ICD10-170.0) 5.  Emphysema. (VFI43-P29.9)      HISTORY OF PRESENTING ILLNESS (02/05/2015):  Beth Wilkins 77 y.o. female  with past medical history of moderate dementia, hypertension, is here because of recently diagnosed stage II colon cancer. She is accompanied to the clinic by her daughter. History is mainly obtained from the chart and her daughter.  She has had abdominal discomfort, nausea and vomiting, and anorexia for  the past to 3 months along with 15 pounds weight loss, . Her nausea was mild and intermittent, she does not seek medical attention initially. She presented with  worsening symptoms for 2-3 weeks, and episodes of projectile vomiting and dehydration to emergency room, was admitted to [facility] on 01/20/2015. CT scan reviewed small bowel obstruction, and possible appendiceal rupture. She was brought to OR on 01/23/2015, underwent right hemicolectomy with anastomosis by Dr. Hassell Done. Surgical path reviewed adenocarcinoma at the cecum. She was discharged home on 01/26/2015.  She has recovered well, eating much better than prior to surgery, move around without restriction. No pain, she has loose BM  3 times a day, and she has backed off her Miralax.  she lives with her daughter. She states her appetite is moderate, sometime low, and she refuses to eat if she doesn't have appetite. No nausea, vomiting, abdominal bloating since her surgery.  Her last colonoscopy was 6-8 years ago, and her stool OB was negative this year.   CURRENT THERAPY: Second-Line Irinotecan and Avastin every 2 weeks started 06/03/16,  Avastin held as needed due to severe proteinuria  INTERIM HISTORY:   Beth Wilkins is here for a follow up and Irinotecan/Avastin. She is accompanied by her daughter. She notes she is doing well. Sh had right leg swelling previously and NP Lacie ordered a doppler. She denies any further swelling currently.  Her daughter notes she is not as tired, she is more active with her nurse aide Neoma Laming 2-3 days a week. She has been drinking plenty of water.       MEDICAL HISTORY:  Past Medical History:  Diagnosis Date  . Anxiety   . Aortic atherosclerosis ([place]) 03/15/2017   noted on PET   . Arthritis   . Bilateral renal cysts   . Centrilobular emphysema ([place]) per 05-10-16 chest xray  . CKD (chronic kidney disease), stage III ([place])   . Colon cancer [facility]) oncologist-  dr [name]   dx 01-23-2015  right colon Invasive adenocarcinoma into pericecal connective tissue and involving small intestine , Stage IIc, Grade 2, LVI(-),  (pT4b N0 M0) /  s/p resection termial ileum and cecum and chemotherapy 01-06-217 to 07-06-2015/   08/ 2017  per PET  recurrent w/ liver mets and peritoneal carcinomatosis  . Complication of anesthesia    can be combative due to dementia  . Full dentures   . Headache    occ stress related  . History of chemotherapy 2019   last chemotherapy infusion 09/29/2017  . History of gastroesophageal reflux (GERD)    changes diet  . History of pelvic fracture 2015  . Hyperlipidemia   . Hypertension   . Liver metastasis ([place])   . 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 N&V (nausea and vomiting)   . Pulmonary nodules   . Right ovarian cyst   . SBO (small bowel obstruction) ([place]) 01/2015  . 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 UTI (urinary tract infection) 09/26/2016   5 day antibiotic  treatment    SURGICAL HISTORY: Past Surgical History:  Procedure Laterality Date  . CATARACT EXTRACTION W/ INTRAOCULAR LENS  IMPLANT, BILATERAL  2005 approx  . CYSTOSCOPY W/ URETERAL STENT PLACEMENT Right 12/03/2015   Procedure: CYSTOSCOPY WITH RETROGRADE PYELOGRAM/URETERAL STENT PLACEMENT;  Surgeon: Cleon Gustin, MD;  Location: [facility];  Service: Urology;  Laterality: Right;  . CYSTOSCOPY W/ URETERAL STENT PLACEMENT Right 02/15/2016   Procedure: CYSTOSCOPY WITH RETROGRADE PYELOGRAM/URETERAL STENT REPLACEMENT;  Surgeon: Cleon Gustin, MD;  Location: [facility];  Service: Urology;  Laterality: Right;  . CYSTOSCOPY W/ URETERAL STENT PLACEMENT Right 03/28/2016   Procedure: CYSTOSCOPY WITH RETROGRADE PYELOGRAM/URETERAL STENT EXCHANGE;  Surgeon: Cleon Gustin, MD;  Location: [facility];  Service: Urology;  Laterality: Right;  . CYSTOSCOPY W/ URETERAL STENT PLACEMENT Right 07/18/2016   Procedure: CYSTOSCOPY WITH RETROGRADE PYELOGRAM/ STENT EXCHANGE;  Surgeon: Cleon Gustin, MD;  Location: [facility];  Service: Urology;  Laterality: Right;  . CYSTOSCOPY W/ URETERAL STENT PLACEMENT Right 10/10/2016   Procedure: CYSTOSCOPY WITH RETROGRADE PYELOGRAM/URETERAL STENT REPLACEMENT;  Surgeon: Cleon Gustin, MD;  Location: [facility];  Service: Urology;  Laterality: Right;  . CYSTOSCOPY W/ URETERAL STENT PLACEMENT Right 04/10/2017   Procedure: CYSTOSCOPY WITH RETROGRADE PYELOGRAM/URETERAL STENT EXCHANGE;  Surgeon: Cleon Gustin, MD;  Location: [facility];  Service: Urology;  Laterality: Right;  . CYSTOSCOPY W/ URETERAL STENT PLACEMENT Right 10/09/2017   Procedure: CYSTOSCOPY WITH RETROGRADE PYELOGRAM/URETERAL STENT PLACEMENT;  Surgeon: Cleon Gustin, MD;  Location: [facility];  Service: Urology;  Laterality: Right;  30 MINS  . CYSTOSCOPY WITH RETROGRADE PYELOGRAM,  URETEROSCOPY AND STENT PLACEMENT Right 09/21/2015   Procedure: CYSTOSCOPY WITH BIL RETROGRADE PYELOGRAM, URETEROSCOPY, RIGHT STENT PLACEMENT ;  Surgeon: Cleon Gustin, MD;  Location: [facility];  Service: Urology;  Laterality: Right;  . HEMICOLECTOMY Right 01/23/2015   Dr. Hassell Done  . IR FLUORO GUIDE PORT INSERTION RIGHT  03/24/2017  . IR US GUIDE VASC ACCESS RIGHT  03/24/2017  . LAPAROSCOPY N/A 01/23/2015   Procedure: DIAGNOSTIC LAPAROSCOPY, EXPLORATORY LAPAROTOMY  RIGHT HEMI-COLECTOMY FOR OBSTRUCTION OF RIGHT TERMINAL ILEUM;  Surgeon: [name], MD;  Location: [facility];  Service: General;  Laterality: N/A;  . PORTACATH PLACEMENT      SOCIAL HISTORY: Social History   Socioeconomic History  . Marital status: Single    Spouse name: Not on file  . Number of children: Not on file  . Years of education: Not on file  . Highest education level: Not on file  Occupational History  . Not on file  Social Needs  . Financial resource strain: Not on file  . Food insecurity:    Worry: Not on file    Inability: Not on file  . Transportation needs:    Medical: Not on file    Non-medical: Not on file  Tobacco Use  . Smoking status: Former Smoker    Packs/day: 0.25    Years: 50.00    Pack years: 12.50    Types: Cigarettes    Last attempt to quit: 01/25/2015    Years since quitting: 2.7  . Smokeless tobacco: Never Used  Substance and Sexual Activity  . Alcohol use: Yes    Alcohol/week: 3.0 standard drinks    Types: 2 Glasses of wine, 1 Cans of beer per week    Comment: occasional   . Drug use: No  . Sexual activity: Not Currently  Lifestyle  . Physical activity:    Days per week: Not on file    Minutes per session: Not on file  . Stress: Not on file  Relationships  . Social connections:    Talks on phone: Not on file    Gets together: Not on file    Attends religious service: Not on file    Active member of club or organization: Not on file    Attends meetings  of clubs or organizations: Not on file    Relationship status: Not on file  . Intimate partner violence:    Fear of current or ex partner: Not on file    Emotionally abused: Not on file    Physically abused: Not on file    Forced sexual activity: Not on file  Other Topics Concern  . Not on file  Social History Narrative   Legally separated   Lives w/daughter, Oluchi Pucci   Has total  of #2 daughters and #1 son   Dementia   Fall Risk-ambulates w/cane    FAMILY HISTORY: Family History  Problem Relation Age of Onset  . Diabetes Mellitus II Father   . Cancer Mother 20       ovarian cancer   . Cervical cancer Other     ALLERGIES:  is allergic to namenda [memantine hcl] and oxycodone.  MEDICATIONS:  Current Outpatient Medications  Medication Sig Dispense Refill  . amLODipine (NORVASC) 5 MG tablet Take 5 mg by mouth every morning.     Marland Kitchen aspirin 81 MG tablet Take 81 mg by mouth daily.     . Cholecalciferol (VITAMIN D3) 2000 UNITS TABS Take by mouth daily.    . Coenzyme Q10 (CO Q 10) 100 MG CAPS Take by mouth daily.    Marland Kitchen lidocaine-prilocaine (EMLA) cream Apply 1 application topically as needed. 30 g 5  . medium chain triglycerides (MCT OIL) oil Take 5 mLs by mouth 2 (two) times daily.     . Melatonin 5 MG TABS Take 5-15 mg by mouth at bedtime as needed.    . Meth-Hyo-M Bl-Na Phos-Ph Sal (URIBEL) 118 MG CAPS Take 1 capsule (118 mg total) by mouth 2 (two) times daily as needed. 30 capsule 3  . mirabegron ER (MYRBETRIQ) 50 MG TB24 tablet Take 1 tablet (50 mg total) by mouth daily. 30 tablet 11  . mirtazapine (REMERON) 15 MG tablet TAKE 1 TABLET BY MOUTH AT BEDTIME 30 tablet 3  . Multiple Vitamins-Minerals (WOMENS MULTIVITAMIN PO) Take 1 tablet by mouth daily. With iron    . polyethylene glycol (MIRALAX / GLYCOLAX) packet Take 17 g by mouth daily. (Patient taking differently: Take 17 g by mouth daily as needed. ) 14 each 0  . prochlorperazine (COMPAZINE) 5 MG tablet Take 1 tablet (5  mg total) by mouth every 6 (six) hours as needed for nausea or vomiting. 30 tablet 0  . QUEtiapine (SEROQUEL) 25 MG tablet Take 1 tablet each night 30 tablet 6  . UNABLE TO FIND Med Name: Closys Mouth Rinse two-three times daily for ulcer.    . vitamin B-12 (CYANOCOBALAMIN) 1000 MCG tablet Take 1,000 mcg by mouth daily.    . traMADol (ULTRAM) 50 MG tablet Take 1 tablet (50 mg total) by mouth every 6 (six) hours as needed. (Patient not taking: Reported on 11/10/2017) 30 tablet 0   No current facility-administered medications for this visit.    Facility-Administered Medications Ordered in Other Visits  Medication Dose Route Frequency Provider Last Rate Last Dose  . heparin lock flush 100 unit/mL  500 Units Intravenous Once [name], MD        REVIEW OF SYSTEMS:   Constitutional: Denies fevers, chills or abnormal night sweats Eyes: Denies blurriness of vision, double vision or watery eyes  Ears, nose, mouth, throat, and face: Denies mucositis or sore throat  Respiratory: Denies cough, dyspnea or wheezes Cardiovascular: Denies palpitation, chest discomfort or lower extremity swelling Gastrointestinal:  Denies heartburn   Urinary:  Denies Dysuria  Skin: Denies abnormal skin rashes Lymphatics: Denies new lymphadenopathy or easy bruising Neurological:Denies numbness, tingling or new weaknesses Behavioral/Psych: Mood is stable, no new changes (+) Dementia, stable  All other systems were reviewed with the patient and are negative.  PHYSICAL EXAMINATION:  ECOG PERFORMANCE STATUS: 1 - Symptomatic but completely ambulatory  Vitals:   11/10/17 1115  BP: (!) 155/90  Pulse: 86  Resp: 18  Temp: 97.8 F (36.6 C)  SpO2: 100%  Filed Weights   11/10/17 1115  Weight: 114 lb 14.4 oz (52.1 kg)     GENERAL:alert, no distress and comfortable SKIN: skin color, texture, turgor are normal, no rashes or significant lesions EYES: normal, conjunctiva are pink and non-injected, sclera clear (+)  difficult to evaluate due to her dementia OROPHARYNX:no exudate (+) Upper and lower dentures  (+) slight color change of tongue  NECK: supple, thyroid normal size, non-tender, without nodularity LYMPH:  no palpable lymphadenopathy in the cervical, axillary or inguinal LUNGS: clear to auscultation and percussion with normal breathing effort HEART: regular rate & rhythm and no murmurs and no lower extremity edema ABDOMEN:abdomen soft, non-tender and normal bowel sounds  Musculoskeletal:no cyanosis of digits and no clubbing  PSYCH: alert and with fluent speech, (+) dementia  NEURO: no focal motor/sensory deficits  LABORATORY DATA:  I have reviewed the data as listed CBC Latest Ref Rng & Units 11/10/2017 10/27/2017 10/09/2017  WBC 3.9 - 10.3 K/uL 6.8 7.2 -  Hemoglobin 11.6 - 15.9 g/dL 10.8(L) 10.7(L) 11.6(L)  Hematocrit 34.8 - 46.6 % 33.0(L) 33.0(L) 34.0(L)  Platelets 145 - 400 K/uL 310 319 -    CMP Latest Ref Rng & Units 11/10/2017 10/27/2017 10/09/2017  Glucose 70 - 99 mg/dL 86 85 86  BUN 8 - 23 mg/dL _0 Creatinine 0.44 - 1.00 mg/dL 1.15(H) 1.30(H) 1.40(H)  Sodium 135 - 145 mmol/L 140 140 143  Potassium 3.5 - 5.1 mmol/L 4.0 4.1 4.1  Chloride 98 - 111 mmol/L 108 106 109  CO2 22 - 32 mmol/L 24 27 -  Calcium 8.9 - 10.3 mg/dL 9.1 8.9 -  Total Protein 6.5 - 8.1 g/dL 6.9 6.6 -  Total Bilirubin 0.3 - 1.2 mg/dL 0.3 0.4 -  Alkaline Phos 38 - 126 U/L 86 72 -  AST 15 - 41 U/L 19 17 -  ALT 0 - 44 U/L 7 12 -    CEA   01/23/2015: 5.7 06/05/2015: 6.1 08/04/2015: 10.3 10/12/2015: 13.29 (in-house)  11/04/2015: 12.83 01/13/2016: 8.57 02/24/2016: 8.41 04/27/2016: 9.22 05/20/2016: 8.32 06/17/2016: 6.45 07/15/2016: 5.07 07/29/2016: 6.51 09/09/16: 7.47 10/07/16: 7.85 11/04/16: 7.75 12/02/16: 8.13 01/06/17: 10.36 02/03/17: 12.60 03/03/18: 9.96  03/31/17: 12.7 04/14/17: 11.93 05/12/17: 13.00 06/09/17: 14.97 07/07/17: 15.22 08/04/17: 17.26 09/01/17: 21.94 09/29/17: 25.89 10/27/17: 40.96   PATHOLOGY  REPORT: Diagnosis 01/23/2015 Colon, segmental resection, with terminal ileum and cecum - INVASIVE ADENOCARCINOMA EXTENDING INTO PERICECAL CONNECTIVE TISSUE AND INVOLVING SMALL INTESTINE. - THREE BENIGN LYMPH NODES (0/3). - SEE ONCOLOGY TABLE BELOW.  Microscopic Comment COLON AND RECTUM (INCLUDING TRANS-ANAL RESECTION): Specimen: Terminal ileum and cecum. Procedure: Resection. Tumor site: Cecum adjacent to appendix. Specimen integrity: Intact. Macroscopic intactness of mesorectum: Not applicable. Macroscopic tumor perforation: No. Invasive tumor: Maximum size: 4.0 cm. Histologic type(s): Colorectal adenocarcinoma. Histologic grade and differentiation: G2: moderately differentiated/low grade. Type of polyp in which invasive carcinoma arose: No residual polyp. Microscopic extension of invasive tumor: Into pericecal connective tissue and terminal ileum. Lymph-Vascular invasion: Not identified. Peri-neural invasion: Not identified. Tumor deposit(s) (discontinuous extramural extension): No. Resection margins: Proximal margin: Free of tumor. Distal margin: Free of tumor. Circumferential (radial) (posterior ascending, posterior descending; lateral and posterior mid-rectum; and entire lower 1/3 rectum): Free of tumor. Mesenteric margin (sigmoid and transverse): N/A. Distance closest margin (if all above margins negative): N/A. Treatment effect (neo-adjuvant therapy): No. Additional polyp(s): No. Non-neoplastic findings: N/A. Lymph nodes: number examined 3; number positive: 0. Pathologic Staging: pT4b, pN0, pMX. Ancillary studies: Mismatch repair protein by immunohistochemistry pending. Comments: There  is a colorectal-type adenocarcinoma arising in the cecum in the area of the appendiceal orifice. The tumor extends into the pericecal connective tissue, and involves the attached segment of terminal ileum. Immunohistochemistry shows the tumor is positive with CDX2 and cytokeratin 20, and  negative with cytokeratin 7, estrogen receptor and WT-1 supporting a diagnosis of primary colorectal adenocarcinoma. ADDITIONAL INFORMATION: Mismatch Repair (MMR) Protein Immunohistochemistry (IHC) IHC Expression Result: MLH1: Preserved nuclear expression (greater 50% tumor expression) MSH2: Preserved nuclear expression (greater 50% tumor expression) MSH6: Preserved nuclear expression (greater 50% tumor expression) PMS2: Preserved nuclear expression (greater 50% tumor expression) * Internal control demonstrates intact nuclear expression Interpretation: NORMAL There is preserved expression of the major and minor MMR proteins. There is a very low probability that microsatellite instability (MSI) is present. However, certain clinically significant MMR protein mutations may result in preservation of nuclear expression. It is recommended that the preservation of protein expression be correlated with molecular based MSI testing.    RADIOGRAPHIC STUDIES: I have personally reviewed the radiological images as listed and agreed with the findings in the report.   PET 09/27/17 IMPRESSION 1. Relatively stable exam with no clear trend towards worsening disease or generalized improvement 2. Scattered tiny bilateral pulmonary nodules without substantial interval change 3. Index hypermetabolic lesions in the liver, omentum, right lower quadrant mesentery, and anterior abdominal wall show similar hypermetabolism to the prior study. 4.  Aortic Atherosclerois (ICD10-170.0) 5.  Emphysema. (VEH20-N47.9)    PET, 07/03/2017 IMPRESSION: 1. General mild worsening, with increased activity in the hepatic metastatic lesions, mesenteric and omental nodularity, and with new/worsening metastatic nodularity in the right lower quadrant below the cecum. 2. There are over 20 small pulmonary nodules which are below sensitive PET-CT size thresholds and stable from the prior exam, but not currently hypermetabolic. 3.  Other imaging findings of potential clinical significance: Aortic Atherosclerosis (ICD10-I70.0) and Emphysema (ICD10-J43.9). Coronary atherosclerosis. Uterine fibroid. Small amount of free pelvic fluid in the cul-de-sac. Old pelvic deformities from prior fractures. Degenerative hip and glenohumeral arthropathy. Spondylosis.  PET 03/13/17 IMPRESSION: 1. Hepatic metastases are stable in size with stable to minimally decreased hypermetabolism. 2. Inferior left rectus abdominus metastasis shows mildly decreased hypermetabolism. 3. Omental nodule no longer shows abnormal hypermetabolism. 4. Scattered pulmonary nodules are stable in size and too small for PET resolution. 5.  Aortic atherosclerosis (ICD10-170.0). 6.  Emphysema (ICD10-J43.9).  PET 11/25/16 IMPRESSION: 1. Interval worsening in the interval. Numerous pulmonary nodules are again seen. While multiple nodules are stable, there are several nodules which are a little larger in the interval. They remain too small to completely characterize with PET-CT. The 2 hepatic metastases demonstrate increased FDG uptake on today's study compared to the previous study. Abdominal wall and peritoneal metastases are mildly worsened.  PET 08/09/2016 IMPRESSION: 1. Overall mild improvement compared to FDG PET scan of 10/28/2015. 2. Pulmonary nodules are increased in size from PET-CT scan and similar size to most recent CT scan. Largest lesion RIGHT lower lobe does have associated faint metabolic activity consistent with pulmonary metastasis. 3. Decrease in metabolic activity of RIGHT hepatic lobe lesion. Potential second lesion LEFT hepatic lobe with slightly lower metabolic activity. 4. Decreased metabolic activity at the enteric colonic anastomosis in the RIGHT lower quadrant. 5. Interval decrease in metabolic activity of peritoneal nodular metastasis. 6. Nodular implant along ventral abdominal wall may be within the musculature, but also improved.  CT CAP  w Contrast 05/19/2016 IMPRESSION: 1. Numerous small solid pulmonary nodules, one of which is new, and several  of which have mildly increased in size since 10/28/2015, worrisome for mild progression of pulmonary metastases. 2. Solitary right liver lobe metastasis is mildly increased in size since 01/08/2016. 3. Peritoneal metastases have mildly increased in size since 01/08/2016, largest in the right pelvic sidewall. 4. Well-positioned right nephroureteral stent without overt right hydronephrosis. 5. Additional findings include aortic atherosclerosis, 1 vessel coronary atherosclerosis and mild to moderate emphysema.  Ct chest, Abdomen Pelvis Wo Contrast 01/08/2016  IMPRESSION: 1. 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 2. Hepatic and renal cysts. 3.  Aortoiliac atherosclerotic vascular disease. 4. Lumbar spondylosis and degenerative disc disease with multilevel impingement.  ASSESSMENT & PLAN: 77 y.o. African-American female, with past medical history of moderate dementia, independent with ADLs, hypertension, presents with small bowel obstruction and weight loss.  1. 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 -I personally reviewed her recent PET scan images, unfortunately the scan showed hypermetabolic peritoneal and liver metastases. 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 -I previously reviewed her [facility] one genomic testing as always patient and her  daughter, her tumor has K-ras mutation, she is not a candidate for EGFR inhibitor. The tumor has MSI stable, she is not a candidate for immunotherapy, although she may be a candidate for clinical trial of immunotherapy plus MEK inhibitor at [facility]. -She started Xeloda 1527m bid, 2 weeks on and one week off on 11/11/15 and stopped 05/20/16 due to disease progression.  -I previously reviewed her restaging CT scan from 01/08/2016, which showed overall stable disease, no new lesions.  -I previously reviewed the CT from 05/19/2016 with the patient and her daughter in detail. She has had a moderate disease progression in lungs, liver and peritoneum. I recommended her to change treatment. -She started second line single agent irinotecan on 06/03/16 at a reduced dose 1523mm2, tolerated well. - PET Scan image from 08/09/16 showed good partial response to treatment. We will continue irinotecan and Avastin. -We previously discussed her 11/25/16 PET scan. She has small increase in size of some of her small lung nodules, and mild increased FDG of her liver mets, but overall stable disease. I think the benefit of current treatment is getting smaller. We may need to change treatment in 2 months after a repeat scan. Will continue Irinotecan and Avastin for now. She agrees.   -PET scan on 03/13/17 revealed that hepatic metastases are stable in size with stable to minimally decreased hypermetabolism, the inferior left rectus abdominus metastasis shows mildly decreased hypermetabolism, the omental nodule no longer shows abnormal hypermetabolism. Scattered pulmonary nodules are stable in size and too small for PET resolution. 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 Previously discussed with pt and daughter. They are very pleased. -Her urine proteins were previously elevated at above 300 and Avastin has been held intermittently since 03/17/17 if urine protein >=300.  -PET Scan 07/03/2017 reviewed mild disease progression, with mainly  increased FDG uptake, the overall metastatic lesion sizes have not changed significantly.  -If she has further disease progression or she experiences side effects from  chemo, I will stop chemo treatment. I do not recommend third line chemo, due to the low benefit and her medical conditions especially dementia. -We previously reviewed her 09/27/17 PET which is relatively stable without clear evidence of disease progression in the chest, abdomen, or pelvis; and no new findings. Given her overall good tolerance and performance status I recommend to continue treatment on the current regimen. -Labs reviewed today, Hg at 10.8, total protein at 100, CMP is still pending. Overall adequate to proceed with Irinotecan and Avastin today  -Her tumor marker CA has been slowly trending up, but she remains to be asymptomatic, plan to repeat her restaging scan in November or December -F/u with me in 4 weeks    2. CKD stage III, right hydroureter  -A previous CT scan showed right hydroureter, which probably contributes to her renal insufficiency. -she had ureter stent placement again in 12/2015, 03/2016, 04/2016, and 04/2017  3. HTN, dementia   -She will continue her medication and follow-up with her primary care physician. -Her BP has been better controlled lately, we previously discussed that Avastin can cause hypertension, we'll continue monitoring closely. -Her dementia had got worse gradually, which has increased her care burden on her daughter. I previously discussed burn-out prevention with her daughter, she appreciated and now doing better with home assistance for her mother.  -Dementia better on Seroquel, continue  -BP at 155/90 today (11/10/17). I reviewed Avastin can cause high blood pressure. Will monitor.    4. Goal of care discussion  -We previously discussed the incurable nature of her cancer, and the overall poor prognosis, especially if she has progress on chemo -The patient understands the goal of  care is palliative. -Due to her advanced age and comorbidities, especially dementia, I have low threshold to switch her to palliative care alone if she does not tolerate chemo well or dementia gets worse, her daughter is on -board  -I recommend DNR/DNI, pt and her daughter agrees.  -She has a caregiver and is under advanced home care. They plan to continue with chemotherapy as long as she can tolerate it.   5. Insomnia  -I first suggested melatonin over-the counter and to check with her neurologist. If that does not help she can use prescription medication.  -Suggested she increase benadryl to 1.5-2 tabs as needed. She will try white noise machine for non-pharm intervention. -Much better sleeping on Seroquel, stable    6. Social support -Due to her dementia and chemotherapy, she requires 24/7 care at home. Her daughter is her only caregiver. I previously sent referrals to aid in getting pt home assistance.  -She started advanced homecare on 08/07/17. She was discharged from her home care nurse and her PT finished recently. Her Speech therapy is going well.   -She has a care giver Debora who comes in 2-3 days/week   Plan -Patient is clinically doing well, labs reviewed, adequate for treatment, will proceed Irinotecan and Avastin today and continue every 2 weeks  -Lab, flush and chemo in 4, 6 and 8 weeks -F/u with me in 4 weeks and APP in 2 weeks    All questions were answered. The patient knows to call the clinic with any problems, questions or concerns.  I spent 20 minutes counseling the patient face to face. The total time spent in the appointment was 25 minutes and more than 50% was on counseling.  Oneal Deputy, am acting as scribe for [name], MD.   I have reviewed the above  documentation for accuracy and completeness, and I agree with the above.     [name]  11/10/2017

## 2017-11-10 ENCOUNTER — Encounter: Payer: Self-pay | Admitting: Hematology

## 2017-11-10 ENCOUNTER — Inpatient Hospital Stay: Payer: Medicare Other

## 2017-11-10 ENCOUNTER — Inpatient Hospital Stay: Payer: Medicare Other | Attending: Hematology | Admitting: Hematology

## 2017-11-10 ENCOUNTER — Telehealth: Payer: Self-pay | Admitting: Hematology

## 2017-11-10 VITALS — BP 155/90 | HR 86 | Temp 97.8°F | Resp 18 | Ht 66.0 in | Wt 114.9 lb

## 2017-11-10 DIAGNOSIS — I700 Atherosclerosis of aorta: Secondary | ICD-10-CM | POA: Insufficient documentation

## 2017-11-10 DIAGNOSIS — C786 Secondary malignant neoplasm of retroperitoneum and peritoneum: Secondary | ICD-10-CM | POA: Insufficient documentation

## 2017-11-10 DIAGNOSIS — N183 Chronic kidney disease, stage 3 (moderate): Secondary | ICD-10-CM | POA: Insufficient documentation

## 2017-11-10 DIAGNOSIS — Z87891 Personal history of nicotine dependence: Secondary | ICD-10-CM | POA: Insufficient documentation

## 2017-11-10 DIAGNOSIS — E785 Hyperlipidemia, unspecified: Secondary | ICD-10-CM | POA: Diagnosis not present

## 2017-11-10 DIAGNOSIS — I2510 Atherosclerotic heart disease of native coronary artery without angina pectoris: Secondary | ICD-10-CM | POA: Diagnosis not present

## 2017-11-10 DIAGNOSIS — C182 Malignant neoplasm of ascending colon: Secondary | ICD-10-CM | POA: Diagnosis present

## 2017-11-10 DIAGNOSIS — F0390 Unspecified dementia without behavioral disturbance: Secondary | ICD-10-CM | POA: Diagnosis not present

## 2017-11-10 DIAGNOSIS — R112 Nausea with vomiting, unspecified: Secondary | ICD-10-CM

## 2017-11-10 DIAGNOSIS — C787 Secondary malignant neoplasm of liver and intrahepatic bile duct: Secondary | ICD-10-CM

## 2017-11-10 DIAGNOSIS — I129 Hypertensive chronic kidney disease with stage 1 through stage 4 chronic kidney disease, or unspecified chronic kidney disease: Secondary | ICD-10-CM | POA: Insufficient documentation

## 2017-11-10 DIAGNOSIS — M1990 Unspecified osteoarthritis, unspecified site: Secondary | ICD-10-CM | POA: Diagnosis not present

## 2017-11-10 DIAGNOSIS — J439 Emphysema, unspecified: Secondary | ICD-10-CM

## 2017-11-10 DIAGNOSIS — Z5112 Encounter for antineoplastic immunotherapy: Secondary | ICD-10-CM | POA: Diagnosis not present

## 2017-11-10 DIAGNOSIS — Z5111 Encounter for antineoplastic chemotherapy: Secondary | ICD-10-CM | POA: Diagnosis not present

## 2017-11-10 DIAGNOSIS — R600 Localized edema: Secondary | ICD-10-CM | POA: Diagnosis not present

## 2017-11-10 DIAGNOSIS — D638 Anemia in other chronic diseases classified elsewhere: Secondary | ICD-10-CM

## 2017-11-10 DIAGNOSIS — Z7982 Long term (current) use of aspirin: Secondary | ICD-10-CM | POA: Insufficient documentation

## 2017-11-10 DIAGNOSIS — G4700 Insomnia, unspecified: Secondary | ICD-10-CM | POA: Diagnosis not present

## 2017-11-10 DIAGNOSIS — R634 Abnormal weight loss: Secondary | ICD-10-CM | POA: Insufficient documentation

## 2017-11-10 DIAGNOSIS — R630 Anorexia: Secondary | ICD-10-CM | POA: Diagnosis not present

## 2017-11-10 DIAGNOSIS — I10 Essential (primary) hypertension: Secondary | ICD-10-CM

## 2017-11-10 DIAGNOSIS — Z79899 Other long term (current) drug therapy: Secondary | ICD-10-CM | POA: Insufficient documentation

## 2017-11-10 LAB — COMPREHENSIVE METABOLIC PANEL
ALK PHOS: 86 U/L (ref 38–126)
ALT: 7 U/L (ref 0–44)
ANION GAP: 8 (ref 5–15)
AST: 19 U/L (ref 15–41)
Albumin: 3.4 g/dL — ABNORMAL LOW (ref 3.5–5.0)
BILIRUBIN TOTAL: 0.3 mg/dL (ref 0.3–1.2)
BUN: 21 mg/dL (ref 8–23)
CALCIUM: 9.1 mg/dL (ref 8.9–10.3)
CO2: 24 mmol/L (ref 22–32)
Chloride: 108 mmol/L (ref 98–111)
Creatinine, Ser: 1.15 mg/dL — ABNORMAL HIGH (ref 0.44–1.00)
GFR calc Af Amer: 53 mL/min — ABNORMAL LOW (ref >60)
GFR, EST NON AFRICAN AMERICAN: 45 mL/min — AB (ref >60)
Glucose, Bld: 86 mg/dL (ref 70–99)
POTASSIUM: 4 mmol/L (ref 3.5–5.1)
Sodium: 140 mmol/L (ref 135–145)
TOTAL PROTEIN: 6.9 g/dL (ref 6.5–8.1)

## 2017-11-10 LAB — CBC WITH DIFFERENTIAL/PLATELET
Basophils Absolute: 0.1 10*3/uL (ref 0.0–0.1)
Basophils Relative: 1 %
Eosinophils Absolute: 0.2 10*3/uL (ref 0.0–0.5)
Eosinophils Relative: 4 %
HEMATOCRIT: 33 % — AB (ref 34.8–46.6)
Hemoglobin: 10.8 g/dL — ABNORMAL LOW (ref 11.6–15.9)
Lymphocytes Relative: 14 %
Lymphs Abs: 1 10*3/uL (ref 0.9–3.3)
MCH: 29.4 pg (ref 25.1–34.0)
MCHC: 32.7 g/dL (ref 31.5–36.0)
MCV: 90 fL (ref 79.5–101.0)
MONO ABS: 0.5 10*3/uL (ref 0.1–0.9)
Monocytes Relative: 7 %
NEUTROS ABS: 5.1 10*3/uL (ref 1.5–6.5)
NEUTROS PCT: 74 %
Platelets: 310 10*3/uL (ref 145–400)
RBC: 3.66 MIL/uL — ABNORMAL LOW (ref 3.70–5.45)
RDW: 18.7 % — AB (ref 11.2–14.5)
WBC: 6.8 10*3/uL (ref 3.9–10.3)

## 2017-11-10 LAB — TOTAL PROTEIN, URINE DIPSTICK: Protein, ur: 100 mg/dL — AB

## 2017-11-10 MED ORDER — ATROPINE SULFATE 1 MG/ML IJ SOLN
INTRAMUSCULAR | Status: AC
  Filled 2017-11-10: qty 1

## 2017-11-10 MED ORDER — SODIUM CHLORIDE 0.9 % IV SOLN
Freq: Once | INTRAVENOUS | Status: AC
  Administered 2017-11-10: 12:00:00 via INTRAVENOUS
  Filled 2017-11-10: qty 250

## 2017-11-10 MED ORDER — ATROPINE SULFATE 1 MG/ML IJ SOLN
0.5000 mg | Freq: Once | INTRAMUSCULAR | Status: AC | PRN
  Administered 2017-11-10: 0.5 mg via INTRAVENOUS

## 2017-11-10 MED ORDER — SODIUM CHLORIDE 0.9% FLUSH
10.0000 mL | Freq: Once | INTRAVENOUS | Status: AC
  Administered 2017-11-10: 10 mL
  Filled 2017-11-10: qty 10

## 2017-11-10 MED ORDER — DEXAMETHASONE SODIUM PHOSPHATE 10 MG/ML IJ SOLN
10.0000 mg | Freq: Once | INTRAMUSCULAR | Status: AC
  Administered 2017-11-10: 10 mg via INTRAVENOUS

## 2017-11-10 MED ORDER — DEXAMETHASONE SODIUM PHOSPHATE 10 MG/ML IJ SOLN
INTRAMUSCULAR | Status: AC
  Filled 2017-11-10: qty 1

## 2017-11-10 MED ORDER — PALONOSETRON HCL INJECTION 0.25 MG/5ML
INTRAVENOUS | Status: AC
  Filled 2017-11-10: qty 5

## 2017-11-10 MED ORDER — SODIUM CHLORIDE 0.9% FLUSH
10.0000 mL | INTRAVENOUS | Status: DC | PRN
  Administered 2017-11-10: 10 mL
  Filled 2017-11-10: qty 10

## 2017-11-10 MED ORDER — IRINOTECAN HCL CHEMO INJECTION 100 MG/5ML
150.0000 mg/m2 | Freq: Once | INTRAVENOUS | Status: AC
  Administered 2017-11-10: 240 mg via INTRAVENOUS
  Filled 2017-11-10: qty 12

## 2017-11-10 MED ORDER — PALONOSETRON HCL INJECTION 0.25 MG/5ML
0.2500 mg | Freq: Once | INTRAVENOUS | Status: AC
  Administered 2017-11-10: 0.25 mg via INTRAVENOUS

## 2017-11-10 MED ORDER — HEPARIN SOD (PORK) LOCK FLUSH 100 UNIT/ML IV SOLN
500.0000 [IU] | Freq: Once | INTRAVENOUS | Status: AC | PRN
  Administered 2017-11-10: 500 [IU]
  Filled 2017-11-10: qty 5

## 2017-11-10 MED ORDER — SODIUM CHLORIDE 0.9 % IV SOLN
5.0000 mg/kg | Freq: Once | INTRAVENOUS | Status: AC
  Administered 2017-11-10: 275 mg via INTRAVENOUS
  Filled 2017-11-10: qty 11

## 2017-11-10 NOTE — Progress Notes (Signed)
Ok to treat with Avastin and urine protein today per Leeann Must, per MD Burr Medico

## 2017-11-10 NOTE — Patient Instructions (Signed)
[facility] Discharge Instructions for Patients Receiving Chemotherapy  Today you received the following chemotherapy agents Irinotecan and Avastin   To help prevent nausea and vomiting after your treatment, we encourage you to take your nausea medication as directed. No Zofran for 3 days. Take Compazine instead.    If you develop nausea and vomiting that is not controlled by your nausea medication, call the clinic.   BELOW ARE SYMPTOMS THAT SHOULD BE REPORTED IMMEDIATELY:  *FEVER GREATER THAN 100.5 F  *CHILLS WITH OR WITHOUT FEVER  NAUSEA AND VOMITING THAT IS NOT CONTROLLED WITH YOUR NAUSEA MEDICATION  *UNUSUAL SHORTNESS OF BREATH  *UNUSUAL BRUISING OR BLEEDING  TENDERNESS IN MOUTH AND THROAT WITH OR WITHOUT PRESENCE OF ULCERS  *URINARY PROBLEMS  *BOWEL PROBLEMS  UNUSUAL RASH Items with * indicate a potential emergency and should be followed up as soon as possible.  Feel free to call the clinic should you have any questions or concerns. The clinic phone number is [phone].  Please show the [place] at check-in to the Emergency Department and triage nurse.

## 2017-11-10 NOTE — Telephone Encounter (Signed)
Appts scheduled calendar printed avs declined per 9/6 los

## 2017-11-15 MED FILL — QUETIAPINE 25 MG TABLET: 25 | 30 days supply | Qty: 30 | Fill #3

## 2017-11-15 MED FILL — MIRTAZAPINE 15 MG TABLET: 15 | 30 days supply | Qty: 30 | Fill #2

## 2017-11-15 MED FILL — MYRBETRIQ ER 50 MG TABLET: 50 | 30 days supply | Qty: 30 | Fill #0

## 2017-11-24 ENCOUNTER — Encounter: Payer: Self-pay | Admitting: Oncology

## 2017-11-24 ENCOUNTER — Telehealth: Payer: Self-pay

## 2017-11-24 ENCOUNTER — Inpatient Hospital Stay: Payer: Medicare Other

## 2017-11-24 ENCOUNTER — Inpatient Hospital Stay (HOSPITAL_BASED_OUTPATIENT_CLINIC_OR_DEPARTMENT_OTHER): Payer: Medicare Other | Admitting: Oncology

## 2017-11-24 VITALS — BP 153/93 | HR 88 | Temp 97.6°F | Resp 18 | Ht 66.0 in | Wt 113.5 lb

## 2017-11-24 DIAGNOSIS — Z7982 Long term (current) use of aspirin: Secondary | ICD-10-CM

## 2017-11-24 DIAGNOSIS — I129 Hypertensive chronic kidney disease with stage 1 through stage 4 chronic kidney disease, or unspecified chronic kidney disease: Secondary | ICD-10-CM

## 2017-11-24 DIAGNOSIS — N183 Chronic kidney disease, stage 3 (moderate): Secondary | ICD-10-CM

## 2017-11-24 DIAGNOSIS — F0390 Unspecified dementia without behavioral disturbance: Secondary | ICD-10-CM

## 2017-11-24 DIAGNOSIS — C182 Malignant neoplasm of ascending colon: Secondary | ICD-10-CM

## 2017-11-24 DIAGNOSIS — C786 Secondary malignant neoplasm of retroperitoneum and peritoneum: Secondary | ICD-10-CM

## 2017-11-24 DIAGNOSIS — M1990 Unspecified osteoarthritis, unspecified site: Secondary | ICD-10-CM

## 2017-11-24 DIAGNOSIS — Z87891 Personal history of nicotine dependence: Secondary | ICD-10-CM

## 2017-11-24 DIAGNOSIS — G4700 Insomnia, unspecified: Secondary | ICD-10-CM

## 2017-11-24 DIAGNOSIS — I2510 Atherosclerotic heart disease of native coronary artery without angina pectoris: Secondary | ICD-10-CM

## 2017-11-24 DIAGNOSIS — C787 Secondary malignant neoplasm of liver and intrahepatic bile duct: Secondary | ICD-10-CM | POA: Diagnosis not present

## 2017-11-24 DIAGNOSIS — E785 Hyperlipidemia, unspecified: Secondary | ICD-10-CM

## 2017-11-24 DIAGNOSIS — Z5111 Encounter for antineoplastic chemotherapy: Secondary | ICD-10-CM

## 2017-11-24 DIAGNOSIS — J439 Emphysema, unspecified: Secondary | ICD-10-CM

## 2017-11-24 DIAGNOSIS — I700 Atherosclerosis of aorta: Secondary | ICD-10-CM

## 2017-11-24 DIAGNOSIS — Z79899 Other long term (current) drug therapy: Secondary | ICD-10-CM

## 2017-11-24 LAB — CBC WITH DIFFERENTIAL/PLATELET
BASOS ABS: 0 10*3/uL (ref 0.0–0.1)
BASOS PCT: 1 %
Eosinophils Absolute: 0.3 10*3/uL (ref 0.0–0.5)
Eosinophils Relative: 4 %
HCT: 34.4 % — ABNORMAL LOW (ref 34.8–46.6)
HEMOGLOBIN: 11 g/dL — AB (ref 11.6–15.9)
LYMPHS PCT: 12 %
Lymphs Abs: 0.8 10*3/uL — ABNORMAL LOW (ref 0.9–3.3)
MCH: 29 pg (ref 25.1–34.0)
MCHC: 32 g/dL (ref 31.5–36.0)
MCV: 90.8 fL (ref 79.5–101.0)
MONO ABS: 0.5 10*3/uL (ref 0.1–0.9)
MONOS PCT: 8 %
NEUTROS ABS: 4.6 10*3/uL (ref 1.5–6.5)
NEUTROS PCT: 75 %
Platelets: 303 10*3/uL (ref 145–400)
RBC: 3.79 MIL/uL (ref 3.70–5.45)
RDW: 17.9 % — AB (ref 11.2–14.5)
WBC: 6.1 10*3/uL (ref 3.9–10.3)

## 2017-11-24 LAB — COMPREHENSIVE METABOLIC PANEL
ALBUMIN: 3.3 g/dL — AB (ref 3.5–5.0)
ALT: 7 U/L (ref 0–44)
ANION GAP: 10 (ref 5–15)
AST: 16 U/L (ref 15–41)
Alkaline Phosphatase: 88 U/L (ref 38–126)
BUN: 14 mg/dL (ref 8–23)
CO2: 25 mmol/L (ref 22–32)
Calcium: 9.1 mg/dL (ref 8.9–10.3)
Chloride: 109 mmol/L (ref 98–111)
Creatinine, Ser: 1.05 mg/dL — ABNORMAL HIGH (ref 0.44–1.00)
GFR calc non Af Amer: 51 mL/min — ABNORMAL LOW (ref >60)
GFR, EST AFRICAN AMERICAN: 59 mL/min — AB (ref >60)
GLUCOSE: 74 mg/dL (ref 70–99)
POTASSIUM: 4.1 mmol/L (ref 3.5–5.1)
SODIUM: 144 mmol/L (ref 135–145)
TOTAL PROTEIN: 6.9 g/dL (ref 6.5–8.1)
Total Bilirubin: 0.4 mg/dL (ref 0.3–1.2)

## 2017-11-24 LAB — CEA (IN HOUSE-CHCC): CEA (CHCC-In House): 44.8 ng/mL — ABNORMAL HIGH (ref 0.00–5.00)

## 2017-11-24 LAB — TOTAL PROTEIN, URINE DIPSTICK: Protein, ur: 300 mg/dL — AB

## 2017-11-24 MED ORDER — ATROPINE SULFATE 1 MG/ML IJ SOLN
INTRAMUSCULAR | Status: AC
  Filled 2017-11-24: qty 1

## 2017-11-24 MED ORDER — ATROPINE SULFATE 1 MG/ML IJ SOLN
0.5000 mg | Freq: Once | INTRAMUSCULAR | Status: AC | PRN
  Administered 2017-11-24: 0.5 mg via INTRAVENOUS

## 2017-11-24 MED ORDER — IRINOTECAN HCL CHEMO INJECTION 100 MG/5ML
150.0000 mg/m2 | Freq: Once | INTRAVENOUS | Status: AC
  Administered 2017-11-24: 240 mg via INTRAVENOUS
  Filled 2017-11-24: qty 12

## 2017-11-24 MED ORDER — SODIUM CHLORIDE 0.9% FLUSH
10.0000 mL | Freq: Once | INTRAVENOUS | Status: AC
  Administered 2017-11-24: 10 mL
  Filled 2017-11-24: qty 10

## 2017-11-24 MED ORDER — PALONOSETRON HCL INJECTION 0.25 MG/5ML
INTRAVENOUS | Status: AC
  Filled 2017-11-24: qty 5

## 2017-11-24 MED ORDER — DEXAMETHASONE SODIUM PHOSPHATE 10 MG/ML IJ SOLN
10.0000 mg | Freq: Once | INTRAMUSCULAR | Status: AC
  Administered 2017-11-24: 10 mg via INTRAVENOUS

## 2017-11-24 MED ORDER — DEXAMETHASONE SODIUM PHOSPHATE 10 MG/ML IJ SOLN
INTRAMUSCULAR | Status: AC
  Filled 2017-11-24: qty 1

## 2017-11-24 MED ORDER — SODIUM CHLORIDE 0.9 % IV SOLN
Freq: Once | INTRAVENOUS | Status: AC
  Administered 2017-11-24: 13:00:00 via INTRAVENOUS
  Filled 2017-11-24: qty 250

## 2017-11-24 MED ORDER — HEPARIN SOD (PORK) LOCK FLUSH 100 UNIT/ML IV SOLN
500.0000 [IU] | Freq: Once | INTRAVENOUS | Status: DC | PRN
  Filled 2017-11-24: qty 5

## 2017-11-24 MED ORDER — SODIUM CHLORIDE 0.9% FLUSH
10.0000 mL | INTRAVENOUS | Status: DC | PRN
  Filled 2017-11-24: qty 10

## 2017-11-24 MED ORDER — PALONOSETRON HCL INJECTION 0.25 MG/5ML
0.2500 mg | Freq: Once | INTRAVENOUS | Status: AC
  Administered 2017-11-24: 0.25 mg via INTRAVENOUS

## 2017-11-24 NOTE — Patient Instructions (Signed)
[facility] Discharge Instructions for Patients Receiving Chemotherapy  Today you received the following chemotherapy agents Irinotecan. To help prevent nausea and vomiting after your treatment, we encourage you to take your nausea medication as directed. No Zofran for 3 days. Take Compazine instead.    If you develop nausea and vomiting that is not controlled by your nausea medication, call the clinic.   BELOW ARE SYMPTOMS THAT SHOULD BE REPORTED IMMEDIATELY:  *FEVER GREATER THAN 100.5 F  *CHILLS WITH OR WITHOUT FEVER  NAUSEA AND VOMITING THAT IS NOT CONTROLLED WITH YOUR NAUSEA MEDICATION  *UNUSUAL SHORTNESS OF BREATH  *UNUSUAL BRUISING OR BLEEDING  TENDERNESS IN MOUTH AND THROAT WITH OR WITHOUT PRESENCE OF ULCERS  *URINARY PROBLEMS  *BOWEL PROBLEMS  UNUSUAL RASH Items with * indicate a potential emergency and should be followed up as soon as possible.  Feel free to call the clinic should you have any questions or concerns. The clinic phone number is [phone].  Please show the [place] at check-in to the Emergency Department and triage nurse.

## 2017-11-24 NOTE — Progress Notes (Signed)
[facility] OFFICE PROGRESS NOTE  [name], MD [place] [address]  DIAGNOSIS: Metastatic colon cancer  PRIOR THERAPY: 1) Terminal ileum and cecum seegmental resection with anastomosis by Dr. Hassell Done. 01/23/2015 2) Xeloda 1500 mg bid X 14 days on-7 off, s/p 5 cycles, stopped early due to wrosening renal function. 03/13/2015-07/06/2015 3) Xeloda 1592m bid, 2 weeks on and one week off, Avastin was added on from cycle 3. Xeloda held from 05/20/16 due to progression on CT from 05/19/16 and total bilirubin increased to 2.6. 11/11/2015-05/19/2016  CURRENT THERAPY: Second-Line Irinotecan and Avastin every 2 weeks started 06/03/16.  INTERVAL HISTORY: Beth RISTER77y.o. female returns for routine follow-up visit accompanied by her daughter.  The patient is feeling fine today and has no specific complaints.  She denies fevers and chills.  Denies chest pain, shortness of breath, cough, hemoptysis.  Denies nausea, vomiting, constipation, diarrhea.  Denies recent weight loss or night sweats.  The patient is here for evaluation prior to her next cycle of Irinotecan and Avastin.  MEDICAL HISTORY: Past Medical History:  Diagnosis Date  . Anxiety   . Aortic atherosclerosis ([facility] 03/15/2017   noted on PET   . Arthritis   . Bilateral renal cysts   . Centrilobular emphysema ([facility] per 05-10-16 chest xray  . CKD (chronic kidney disease), stage III ([facility]   . Colon cancer ([facility] oncologist-  dr Beth Wilkins  dx 01-23-2015  right colon Invasive adenocarcinoma into pericecal connective tissue and involving small intestine , Stage IIc, Grade 2, LVI(-),  (pT4b N0 M0) /  s/p resection termial ileum and cecum and chemotherapy 01-06-217 to 07-06-2015/   08/ 2017  per PET  recurrent w/ liver mets and peritoneal carcinomatosis  . Complication of anesthesia    can be combative due to dementia  . Full dentures   . Headache    occ stress related  . History of chemotherapy 2019    last chemotherapy infusion 09/29/2017  . History of gastroesophageal reflux (GERD)    changes diet  . History of pelvic fracture 2015  . Hyperlipidemia   . Hypertension   . Liver metastasis ([facility]   . Moderate dementia without behavioral disturbance    moderate to severe dementia per neurologist note (dr Beth Wilkins in epic  . N&V (nausea and vomiting)   . Pulmonary nodules   . Right ovarian cyst   . SBO (small bowel obstruction) ([facility] 01/2015  . Ureteral obstruction, right urologist-  dr mAlyson Ingles  malignant obstruction right ureter w/ colon cancer -- treated with ureteral stent placement  . UTI (urinary tract infection) 09/26/2016   5 day antibiotic treatment    ALLERGIES:  is allergic to namenda [memantine hcl] and oxycodone.  MEDICATIONS:  Current Outpatient Medications  Medication Sig Dispense Refill  . amLODipine (NORVASC) 5 MG tablet Take 5 mg by mouth every morning.     .Marland Kitchenaspirin 81 MG tablet Take 81 mg by mouth daily.     . Cholecalciferol (VITAMIN D3) 2000 UNITS TABS Take by mouth daily.    . Coenzyme Q10 (CO Q 10) 100 MG CAPS Take by mouth daily.    .Marland Kitchenlidocaine-prilocaine (EMLA) cream Apply 1 application topically as needed. 30 g 5  . medium chain triglycerides (MCT OIL) oil Take 5 mLs by mouth 2 (two) times daily.     . Melatonin 5 MG TABS Take 5-15 mg by mouth at bedtime as needed.    . Meth-Hyo-M  Bl-Na Phos-Ph Sal (URIBEL) 118 MG CAPS Take 1 capsule (118 mg total) by mouth 2 (two) times daily as needed. 30 capsule 3  . mirabegron ER (MYRBETRIQ) 50 MG TB24 tablet Take 1 tablet (50 mg total) by mouth daily. 30 tablet 11  . mirtazapine (REMERON) 15 MG tablet TAKE 1 TABLET BY MOUTH AT BEDTIME 30 tablet 3  . Multiple Vitamins-Minerals (WOMENS MULTIVITAMIN PO) Take 1 tablet by mouth daily. With iron    . polyethylene glycol (MIRALAX / GLYCOLAX) packet Take 17 g by mouth daily. (Patient taking differently: Take 17 g by mouth daily as needed. ) 14 each 0  . prochlorperazine  (COMPAZINE) 5 MG tablet Take 1 tablet (5 mg total) by mouth every 6 (six) hours as needed for nausea or vomiting. 30 tablet 0  . QUEtiapine (SEROQUEL) 25 MG tablet Take 1 tablet each night 30 tablet 6  . traMADol (ULTRAM) 50 MG tablet Take 1 tablet (50 mg total) by mouth every 6 (six) hours as needed. (Patient not taking: Reported on 11/10/2017) 30 tablet 0  . UNABLE TO FIND Med Name: Closys Mouth Rinse two-three times daily for ulcer.    . vitamin B-12 (CYANOCOBALAMIN) 1000 MCG tablet Take 1,000 mcg by mouth daily.     No current facility-administered medications for this visit.    Facility-Administered Medications Ordered in Other Visits  Medication Dose Route Frequency Provider Last Rate Last Dose  . heparin lock flush 100 unit/mL  500 Units Intravenous Once [name], MD        SURGICAL HISTORY:  Past Surgical History:  Procedure Laterality Date  . CATARACT EXTRACTION W/ INTRAOCULAR LENS  IMPLANT, BILATERAL  2005 approx  . CYSTOSCOPY W/ URETERAL STENT PLACEMENT Right 12/03/2015   Procedure: CYSTOSCOPY WITH RETROGRADE PYELOGRAM/URETERAL STENT PLACEMENT;  Surgeon: Cleon Gustin, MD;  Location[facility];  Service: Urology;  Laterality: Right;  . CYSTOSCOPY W/ URETERAL STENT PLACEMENT Right 02/15/2016   Procedure: CYSTOSCOPY WITH RETROGRADE PYELOGRAM/URETERAL STENT REPLACEMENT;  Surgeon: Cleon Gustin, MD;  Location: [facility];  Service: Urology;  Laterality: Right;  . CYSTOSCOPY W/ URETERAL STENT PLACEMENT Right 03/28/2016   Procedure: CYSTOSCOPY WITH RETROGRADE PYELOGRAM/URETERAL STENT EXCHANGE;  Surgeon: Cleon Gustin, MD;  Location: [facility];  Service: Urology;  Laterality: Right;  . CYSTOSCOPY W/ URETERAL STENT PLACEMENT Right 07/18/2016   Procedure: CYSTOSCOPY WITH RETROGRADE PYELOGRAM/ STENT EXCHANGE;  Surgeon: Cleon Gustin, MD;  Location[facility];  Service: Urology;  Laterality: Right;  .  CYSTOSCOPY W/ URETERAL STENT PLACEMENT Right 10/10/2016   Procedure: CYSTOSCOPY WITH RETROGRADE PYELOGRAM/URETERAL STENT REPLACEMENT;  Surgeon: Cleon Gustin, MD;  Location: [facility];  Service: Urology;  Laterality: Right;  . CYSTOSCOPY W/ URETERAL STENT PLACEMENT Right 04/10/2017   Procedure: CYSTOSCOPY WITH RETROGRADE PYELOGRAM/URETERAL STENT EXCHANGE;  Surgeon: Cleon Gustin, MD;  Location: [facility];  Service: Urology;  Laterality: Right;  . CYSTOSCOPY W/ URETERAL STENT PLACEMENT Right 10/09/2017   Procedure: CYSTOSCOPY WITH RETROGRADE PYELOGRAM/URETERAL STENT PLACEMENT;  Surgeon: Cleon Gustin, MD;  Location: [facility];  Service: Urology;  Laterality: Right;  30 MINS  . CYSTOSCOPY WITH RETROGRADE PYELOGRAM, URETEROSCOPY AND STENT PLACEMENT Right 09/21/2015   Procedure: CYSTOSCOPY WITH BIL RETROGRADE PYELOGRAM, URETEROSCOPY, RIGHT STENT PLACEMENT ;  Surgeon: Cleon Gustin, MD;  Location[facility];  Service: Urology;  Laterality: Right;  . HEMICOLECTOMY Right 01/23/2015   Dr. Hassell Done  . IR FLUORO  GUIDE PORT INSERTION RIGHT  03/24/2017  . IR US GUIDE VASC ACCESS RIGHT  03/24/2017  . LAPAROSCOPY N/A 01/23/2015   Procedure: DIAGNOSTIC LAPAROSCOPY, EXPLORATORY LAPAROTOMY  RIGHT HEMI-COLECTOMY FOR OBSTRUCTION OF RIGHT TERMINAL ILEUM;  Surgeon: [name], MD;  Location: [facility];  Service: General;  Laterality: N/A;  . PORTACATH PLACEMENT      REVIEW OF SYSTEMS:   Review of Systems  Constitutional: Negative for appetite change, chills, fatigue, fever and unexpected weight change.  HENT:   Negative for mouth sores, nosebleeds, sore throat and trouble swallowing.   Eyes: Negative for eye problems and icterus.  Respiratory: Negative for cough, hemoptysis, shortness of breath and wheezing.   Cardiovascular: Negative for chest pain and leg swelling.  Gastrointestinal: Negative for abdominal pain, constipation,  diarrhea, nausea and vomiting.  Genitourinary: Negative for bladder incontinence, difficulty urinating, dysuria, frequency and hematuria.   Musculoskeletal: Negative for back pain, gait problem, neck pain and neck stiffness.  Skin: Negative for itching and rash.  Neurological: Negative for dizziness, extremity weakness, gait problem, headaches, light-headedness and seizures.  Hematological: Negative for adenopathy. Does not bruise/bleed easily.  Psychiatric/Behavioral: Negative for confusion, depression and sleep disturbance. The patient is not nervous/anxious.     PHYSICAL EXAMINATION:  Blood pressure (!) 153/93, pulse 88, temperature 97.6 F (36.4 C), temperature source Oral, resp. rate 18, height _0  (1.676 m), weight 113 lb 8 oz (51.5 kg), SpO2 100 %.  ECOG PERFORMANCE STATUS: 1 - Symptomatic but completely ambulatory  Physical Exam  Constitutional: Oriented to person, place, and time and well-developed, well-nourished, and in no distress. No distress.  HENT:  Head: Normocephalic and atraumatic.  Mouth/Throat: Oropharynx is clear and moist. No oropharyngeal exudate.  Eyes: Conjunctivae are normal. Right eye exhibits no discharge. Left eye exhibits no discharge. No scleral icterus.  Neck: Normal range of motion. Neck supple.  Cardiovascular: Normal rate, regular rhythm, normal heart sounds and intact distal pulses.   Pulmonary/Chest: Effort normal and breath sounds normal. No respiratory distress. No wheezes. No rales.  Abdominal: Soft. Bowel sounds are normal. Exhibits no distension and no mass. There is no tenderness.  Musculoskeletal: Normal range of motion. Exhibits no edema.  Lymphadenopathy:    No cervical adenopathy.  Neurological: Alert and oriented to person, place, and time. Exhibits normal muscle tone. Gait normal. Coordination normal.  Skin: Skin is warm and dry. No rash noted. Not diaphoretic. No erythema. No pallor.  Psychiatric: Mood, memory and judgment normal.   Vitals reviewed.  LABORATORY DATA: Lab Results  Component Value Date   WBC 6.1 11/24/2017   HGB 11.0 (L) 11/24/2017   HCT 34.4 (L) 11/24/2017   MCV 90.8 11/24/2017   PLT 303 11/24/2017      Chemistry      Component Value Date/Time   NA 144 11/24/2017 1016   NA 142 03/03/2017 0944   K 4.1 11/24/2017 1016   K 3.7 03/03/2017 0944   CL 109 11/24/2017 1016   CO2 25 11/24/2017 1016   CO2 23 03/03/2017 0944   BUN 14 11/24/2017 1016   BUN 13.1 03/03/2017 0944   CREATININE 1.05 (H) 11/24/2017 1016   CREATININE 1.3 (H) 03/03/2017 0944      Component Value Date/Time   CALCIUM 9.1 11/24/2017 1016   CALCIUM 9.4 03/03/2017 0944   ALKPHOS 88 11/24/2017 1016   ALKPHOS 80 03/03/2017 0944   AST 16 11/24/2017 1016   AST 16 03/03/2017 0944   ALT 7 11/24/2017 1016   ALT 7 03/03/2017  0944   BILITOT 0.4 11/24/2017 1016   BILITOT 0.33 03/03/2017 0944       RADIOGRAPHIC STUDIES:  No results found.   ASSESSMENT/PLAN:  77 y.o.African-American female, with past medical history of moderate dementia, independent with ADLs, hypertension, presents with small bowel obstruction and weight loss.  1. 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 Gradillas appears stable today. She completed another cycle of irinotecan/avastin on 04/28/17; she continues to tolerate well.  She denies any issues with diarrhea.  -Blood pressure is elevated today but will recheck in infusion.   -Labs reviewed, CBC and CMET are adequate.  Urine protein is elevated.  We will proceed with Irinotecan today as scheduled and hold Avastin.   -CEA from today is pending.  Has been slowly increasing. -Has follow-up with Dr. Burr Medico on 12/08/2017.  2.CKD stage III, right hydroureter S/p right ureteral stent exchange on 2/4; she tolerated procedure well -Creatinine remains stable. -continue f/u with urology  3.HTN,  dementia -Dementia is stable, on seroquel -BP has been well controlled overall but is elevated today.  We will recheck blood pressure in infusion.  On amlodipine  4. Nausea and diarrhea -No nausea today. -No diarrhea reported today. -She may use Imodium as needed if diarrhea develops.  5. Goals of care -Patient and family agree to continue this regimen while patient tolerates it; she is tolerating well  6. Insomnia -Her daughter reports she is sleeping well lately  7. Social support -She is well cared for by her daughter and family, she has a "companion" who visits her often and encourages her to exercise  PLAN -Labs reviewed, proceed with Irinotecan today.  Hold Avastin secondary to elevated urine protein.   -Follow-up visit in 2 weeks with chemotherapy.  All questions were answered. The patient knows to call the clinic with any problems, questions or concerns. No barriers to learning was detected.  No orders of the defined types were placed in this encounter.    Mikey Bussing, DNP, AGPCNP-BC, AOCNP 11/24/17

## 2017-11-24 NOTE — Telephone Encounter (Signed)
Pt urine protein resulted 300. No avastin today. Only irinotecan. Ardyth Harps, RN made aware.

## 2017-11-28 MED FILL — AMLODIPINE BESYLATE 5 MG TA: 5 | 30 days supply | Qty: 30 | Fill #0

## 2017-12-05 NOTE — Progress Notes (Signed)
[place]  [phone] [phone]) [phone]  Clinic Follow Up Note   Patient Care Team: [name], MD as PCP - General (Family Medicine) [name], MD as Consulting Physician (Medical Oncology) [name], MD as Consulting Physician (General Surgery) [name], MD as Consulting Physician (Orthopedic Surgery) [name] [name], MD as Consulting Physician (Urology)   Date of Service:  12/08/2017   CHIEF COMPLAINTS:  Follow up metastatic colon cancer  Oncology History   Cancer of right colon [facility])   Staging form: Colon and Rectum, AJCC 7th Edition     Pathologic stage from 01/23/2015: Stage IIC (T4b, N0, cM0) - Signed by [name], MD on 02/05/2015       Cancer of right colon ([place])   01/21/2015 Imaging     CT abdomen and pelvis showed high-grade small bowel obstruction,  right I [place] ureteral nephrosis,  multiple liver cysts.  CT chest was negative for metastatic lesion.    01/23/2015 Pathology Results     invasive adenocarcinoma extending into the pericecal connective tissue,  grade 2, LVI (-),  no perineural invasion, 3 lymph nodes were negative.    01/23/2015 Surgery      terminal ileum and cecum seegmental resection with anastomosis by Dr. Hassell Done    01/23/2015 Initial Diagnosis    Cancer of right colon ([place])    03/13/2015 - 07/06/2015 Chemotherapy    Xeloda 1500 mg bid X 14 days on-7 off, s/p 5 cycles, stopped early due to wrosening renal function    10/28/2015 Progression    PET scan showed multiple hypermetabolic peritoneal nodules, right ovarian cystic mass, and hypermetabolic liver lesion, highly suspicious for metastatic colon cancer. Small pulmonary nodules are indeterminate.     11/11/2015 - 05/20/2016 Chemotherapy    Xeloda '1500mg'$  bid, 2 weeks on and one week off, Avastin was added on from cycle 3. Xeloda held from 05/20/16 due to progression on CT from 05/19/16 and total bilirubin increased to 2.6.    01/08/2016 Imaging   Ct chest, Abdomen Pelvis Wo Contrast 01/08/2016  IMPRESSION: 1. 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 2. Hepatic and renal cysts. 3.  Aortoiliac atherosclerotic vascular disease. 4. Lumbar spondylosis and degenerative disc disease with multilevel Impingement.    05/19/2016 Imaging    CT CAP w Contrast IMPRESSION: 1. Numerous small solid pulmonary nodules, one of which is new, and several of which have mildly increased in size since 10/28/2015, worrisome for mild progression of pulmonary metastases. 2. Solitary right liver lobe metastasis is mildly increased in size since 01/08/2016. 3. Peritoneal metastases have mildly increased in size since 01/08/2016, largest in the right pelvic sidewall. 4. Well-positioned right nephroureteral stent without overt right hydronephrosis. 5. Additional findings include aortic atherosclerosis, 1 vessel coronary atherosclerosis and mild to moderate emphysema.    06/03/2016 -  Chemotherapy    Second-Line Irinotecan and Avastin every 2 weeks starting 06/03/16    07/29/2016 Tumor Marker    CEA 6.51     08/09/2016 Imaging    PET  IMPRESSION: 1. Overall mild improvement compared to FDG PET scan of 10/28/2015. 2. Pulmonary nodules are increased in size from PET-CT scan and similar size to most recent CT scan. Largest lesion RIGHT lower lobe does have associated faint metabolic activity consistent with pulmonary metastasis. 3. Decrease in metabolic activity of RIGHT hepatic lobe lesion. Potential second lesion LEFT hepatic lobe with slightly lower metabolic activity. 4. Decreased metabolic activity at the enteric colonic  anastomosis in the RIGHT lower quadrant. 5. Interval decrease in metabolic activity of peritoneal nodular metastasis. 6. Nodular implant implant along ventral abdominal wall may be within the musculature, but also improved.    09/09/2016 Tumor Marker    CEA: 7.47     10/07/2016 Tumor Marker    CEA: 7.85    11/04/2016 Tumor Marker    CEA: 7.75    11/25/2016 PET scan    PET 11/25/16 IMPRESSION: 1. Interval worsening in the interval. Numerous pulmonary nodules are again seen. While multiple nodules are stable, there are several nodules which are a little larger in the interval. They remain too small to completely characterize with PET-CT. The 2 hepatic metastases demonstrate increased FDG uptake on today's study compared to the previous study. Abdominal wall and peritoneal metastases are mildly worsened.    12/02/2016 Tumor Marker    CEA: 8.13    01/06/2017 Tumor Marker    CEA: 10.36    02/03/2017 Tumor Marker    CEA: 12.60    03/03/2017 Tumor Marker    CEA: 9.96    03/13/2017 PET scan    IMPRESSION: 1. Hepatic metastases are stable in size with stable to minimally decreased hypermetabolism. 2. Inferior left rectus abdominus metastasis shows mildly decreased hypermetabolism. 3. Omental nodule no longer shows abnormal hypermetabolism. 4. Scattered pulmonary nodules are stable in size and too small for PET resolution. 5.  Aortic atherosclerosis (ICD10-170.0). 6.  Emphysema (ICD10-J43.9).    07/03/2017 Imaging    PET, 07/03/2017 IMPRESSION: 1. General mild worsening, with increased activity in the hepatic metastatic lesions, mesenteric and omental nodularity, and with new/worsening metastatic nodularity in the right lower quadrant below the cecum. 2. There are over 20 small pulmonary nodules which are below sensitive PET-CT size thresholds and stable from the prior exam, but not currently hypermetabolic. 3. Other imaging findings of potential clinical significance: Aortic Atherosclerosis (ICD10-I70.0) and Emphysema (ICD10-J43.9). Coronary atherosclerosis. Uterine fibroid. Small amount of free pelvic fluid in the cul-de-sac. Old pelvic deformities from prior fractures. Degenerative hip and glenohumeral arthropathy. Spondylosis.    09/27/2017 PET scan     IMPRESSION: 1. Relatively stable exam with no clear trend towards worsening disease or generalized improvement. 2. Scattered tiny bilateral pulmonary nodules without substantial interval change. 3. Index hypermetabolic lesions in the liver, omentum, right lower quadrant mesentery, and anterior abdominal wall show similar hypermetabolism to the prior study. 4.  Aortic Atherosclerois (ICD10-170.0) 5.  Emphysema. (LNL89-Q11.9)      HISTORY OF PRESENTING ILLNESS (02/05/2015):  Beth Wilkins 77 y.o. female  with past medical history of moderate dementia, hypertension, is here because of recently diagnosed stage II colon cancer. She is accompanied to the clinic by her daughter. History is mainly obtained from the chart and her daughter.  She has had abdominal discomfort, nausea and vomiting, and anorexia for  the past to 3 months along with 15 pounds weight loss, . Her nausea was mild and intermittent, she does not seek medical attention initially. She presented with  worsening symptoms for 2-3 weeks, and episodes of projectile vomiting and dehydration to emergency room, was admitted to [facility] on 01/20/2015. CT scan reviewed small bowel obstruction, and possible appendiceal rupture. She was brought to OR on 01/23/2015, underwent right hemicolectomy with anastomosis by Dr. Hassell Done. Surgical path reviewed adenocarcinoma at the cecum. She was discharged home on 01/26/2015.  She has recovered well, eating much better than prior to surgery, move around without restriction. No pain, she has loose BM 3 times  a day, and she has backed off her Miralax.  she lives with her daughter. She states her appetite is moderate, sometime low, and she refuses to eat if she doesn't have appetite. No nausea, vomiting, abdominal bloating since her surgery.  Her last colonoscopy was 6-8 years ago, and her stool OB was negative this year.   CURRENT THERAPY: Second-Line Irinotecan and Avastin every 2 weeks started 06/03/16,  Avastin held as needed due to severe proteinuria  INTERIM HISTORY:   Beth Wilkins is here for a follow up and Irinotecan/Avastin. She is accompanied by her daughter. She notes she is doing well overall. She is able to still gain weight well and drink adequately. She denies any concenrs or pain. Her BMs have been regualr overall. After last cycle she had diarrhea which is quickly resolved with Imodium.  We reveiwed her medication list. She will occasionally have arthritis in her knees so she will use topical creams for this pain.    MEDICAL HISTORY:  Past Medical History:  Diagnosis Date  . Anxiety   . Aortic atherosclerosis ([place]) 03/15/2017   noted on PET   . Arthritis   . Bilateral renal cysts   . Centrilobular emphysema ([place]) per 05-10-16 chest xray  . CKD (chronic kidney disease), stage III ([place])   . Colon cancer [facility]) oncologist-  dr [name]   dx 01-23-2015  right colon Invasive adenocarcinoma into pericecal connective tissue and involving small intestine , Stage IIc, Grade 2, LVI(-),  (pT4b N0 M0) /  s/p resection termial ileum and cecum and chemotherapy 01-06-217 to 07-06-2015/   08/ 2017  per PET  recurrent w/ liver mets and peritoneal carcinomatosis  . Complication of anesthesia    can be combative due to dementia  . Full dentures   . Headache    occ stress related  . History of chemotherapy 2019   last chemotherapy infusion 09/29/2017  . History of gastroesophageal reflux (GERD)    changes diet  . History of pelvic fracture 2015  . Hyperlipidemia   . Hypertension   . Liver metastasis ([place])   . 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 N&V (nausea and vomiting)   . Pulmonary nodules   . Right ovarian cyst   . SBO (small bowel obstruction) ([place]) 01/2015  . Ureteral obstruction, right urologist-  dr [name]   malignant obstruction right ureter w/ colon cancer -- treated with ureteral stent  placement  . UTI (urinary tract infection) 09/26/2016   5 day antibiotic treatment    SURGICAL HISTORY: Past Surgical History:  Procedure Laterality Date  . CATARACT EXTRACTION W/ INTRAOCULAR LENS  IMPLANT, BILATERAL  2005 approx  . CYSTOSCOPY W/ URETERAL STENT PLACEMENT Right 12/03/2015   Procedure: CYSTOSCOPY WITH RETROGRADE PYELOGRAM/URETERAL STENT PLACEMENT;  Surgeon: Cleon Gustin, MD;  Location: [facility];  Service: Urology;  Laterality: Right;  . CYSTOSCOPY W/ URETERAL STENT PLACEMENT Right 02/15/2016   Procedure: CYSTOSCOPY WITH RETROGRADE PYELOGRAM/URETERAL STENT REPLACEMENT;  Surgeon: Cleon Gustin, MD;  Location[facility];  Service: Urology;  Laterality: Right;  . CYSTOSCOPY W/ URETERAL STENT PLACEMENT Right 03/28/2016   Procedure: CYSTOSCOPY WITH RETROGRADE PYELOGRAM/URETERAL STENT EXCHANGE;  Surgeon: Cleon Gustin, MD;  Location: [facility];  Service: Urology;  Laterality: Right;  . CYSTOSCOPY W/ URETERAL STENT PLACEMENT Right 07/18/2016   Procedure: CYSTOSCOPY WITH RETROGRADE PYELOGRAM/ STENT EXCHANGE;  Surgeon: [name],  [name], MD;  Location: [facility];  Service: Urology;  Laterality: Right;  . CYSTOSCOPY W/ URETERAL STENT PLACEMENT Right 10/10/2016   Procedure: CYSTOSCOPY WITH RETROGRADE PYELOGRAM/URETERAL STENT REPLACEMENT;  Surgeon: Cleon Gustin, MD;  Location: [facility];  Service: Urology;  Laterality: Right;  . CYSTOSCOPY W/ URETERAL STENT PLACEMENT Right 04/10/2017   Procedure: CYSTOSCOPY WITH RETROGRADE PYELOGRAM/URETERAL STENT EXCHANGE;  Surgeon: Cleon Gustin, MD;  Location: [facility];  Service: Urology;  Laterality: Right;  . CYSTOSCOPY W/ URETERAL STENT PLACEMENT Right 10/09/2017   Procedure: CYSTOSCOPY WITH RETROGRADE PYELOGRAM/URETERAL STENT PLACEMENT;  Surgeon: Cleon Gustin, MD;  Location: [facility];  Service: Urology;   Laterality: Right;  30 MINS  . CYSTOSCOPY WITH RETROGRADE PYELOGRAM, URETEROSCOPY AND STENT PLACEMENT Right 09/21/2015   Procedure: CYSTOSCOPY WITH BIL RETROGRADE PYELOGRAM, URETEROSCOPY, RIGHT STENT PLACEMENT ;  Surgeon: Cleon Gustin, MD;  Location: [facility];  Service: Urology;  Laterality: Right;  . HEMICOLECTOMY Right 01/23/2015   Dr. Hassell Done  . IR FLUORO GUIDE PORT INSERTION RIGHT  03/24/2017  . IR US GUIDE VASC ACCESS RIGHT  03/24/2017  . LAPAROSCOPY N/A 01/23/2015   Procedure: DIAGNOSTIC LAPAROSCOPY, EXPLORATORY LAPAROTOMY  RIGHT HEMI-COLECTOMY FOR OBSTRUCTION OF RIGHT TERMINAL ILEUM;  Surgeon: [name], MD;  Location: [facility];  Service: General;  Laterality: N/A;  . PORTACATH PLACEMENT      SOCIAL HISTORY: Social History   Socioeconomic History  . Marital status: Single    Spouse name: Not on file  . Number of children: Not on file  . Years of education: Not on file  . Highest education level: Not on file  Occupational History  . Not on file  Social Needs  . Financial resource strain: Not on file  . Food insecurity:    Worry: Not on file    Inability: Not on file  . Transportation needs:    Medical: Not on file    Non-medical: Not on file  Tobacco Use  . Smoking status: Former Smoker    Packs/day: 0.25    Years: 50.00    Pack years: 12.50    Types: Cigarettes    Last attempt to quit: 01/25/2015    Years since quitting: 2.8  . Smokeless tobacco: Never Used  Substance and Sexual Activity  . Alcohol use: Yes    Alcohol/week: 3.0 standard drinks    Types: 2 Glasses of wine, 1 Cans of beer per week    Comment: occasional   . Drug use: No  . Sexual activity: Not Currently  Lifestyle  . Physical activity:    Days per week: Not on file    Minutes per session: Not on file  . Stress: Not on file  Relationships  . Social connections:    Talks on phone: Not on file    Gets together: Not on file    Attends religious service: Not on file     Active member of club or organization: Not on file    Attends meetings of clubs or organizations: Not on file    Relationship status: Not on file  . Intimate partner violence:    Fear of current or ex partner: Not on file    Emotionally abused: Not on file    Physically abused: Not on file    Forced sexual activity: Not on file  Other Topics Concern  . Not on file  Social History Narrative   Legally separated   Lives  w/daughter, Laverna Peace   Has total of #2 daughters and #1 son   Dementia   Fall Risk-ambulates w/cane    FAMILY HISTORY: Family History  Problem Relation Age of Onset  . Diabetes Mellitus II Father   . Cancer Mother 61       ovarian cancer   . Cervical cancer Other     ALLERGIES:  is allergic to namenda [memantine hcl] and oxycodone.  MEDICATIONS:  Current Outpatient Medications  Medication Sig Dispense Refill  . amLODipine (NORVASC) 5 MG tablet Take 5 mg by mouth every morning.     Marland Kitchen aspirin 81 MG tablet Take 81 mg by mouth daily.     . Cholecalciferol (VITAMIN D3) 2000 UNITS TABS Take by mouth daily.    . Coenzyme Q10 (CO Q 10) 100 MG CAPS Take by mouth daily.    Marland Kitchen lidocaine-prilocaine (EMLA) cream Apply 1 application topically as needed. 30 g 5  . medium chain triglycerides (MCT OIL) oil Take 5 mLs by mouth 2 (two) times daily.     . Melatonin 5 MG TABS Take 5-15 mg by mouth at bedtime as needed.    . Meth-Hyo-M Bl-Na Phos-Ph Sal (URIBEL) 118 MG CAPS Take 1 capsule (118 mg total) by mouth 2 (two) times daily as needed. 30 capsule 3  . mirabegron ER (MYRBETRIQ) 50 MG TB24 tablet Take 1 tablet (50 mg total) by mouth daily. 30 tablet 11  . mirtazapine (REMERON) 15 MG tablet TAKE 1 TABLET BY MOUTH AT BEDTIME 30 tablet 3  . Multiple Vitamins-Minerals (WOMENS MULTIVITAMIN PO) Take 1 tablet by mouth daily. With iron    . polyethylene glycol (MIRALAX / GLYCOLAX) packet Take 17 g by mouth daily. (Patient taking differently: Take 17 g by mouth daily as needed. )  14 each 0  . prochlorperazine (COMPAZINE) 5 MG tablet Take 1 tablet (5 mg total) by mouth every 6 (six) hours as needed for nausea or vomiting. 30 tablet 0  . QUEtiapine (SEROQUEL) 25 MG tablet Take 1 tablet each night 30 tablet 6  . UNABLE TO FIND Med Name: Closys Mouth Rinse two-three times daily for ulcer.    . vitamin B-12 (CYANOCOBALAMIN) 1000 MCG tablet Take 1,000 mcg by mouth daily.     Current Facility-Administered Medications  Medication Dose Route Frequency Provider Last Rate Last Dose  . Influenza vac split quadrivalent PF (FLUARIX) injection 0.5 mL  0.5 mL Intramuscular Once [name], MD       Facility-Administered Medications Ordered in Other Visits  Medication Dose Route Frequency Provider Last Rate Last Dose  . heparin lock flush 100 unit/mL  500 Units Intravenous Once [name], MD        REVIEW OF SYSTEMS:   Constitutional: Denies fevers, chills or abnormal night sweats Eyes: Denies blurriness of vision, double vision or watery eyes  Ears, nose, mouth, throat, and face: Denies mucositis or sore throat  Respiratory: Denies cough, dyspnea or wheezes Cardiovascular: Denies palpitation, chest discomfort or lower extremity swelling Gastrointestinal:  Denies heartburn   Urinary:  Denies Dysuria  Skin: Denies abnormal skin rashes Lymphatics: Denies new lymphadenopathy or easy bruising Neurological:Denies numbness, tingling or new weaknesses Behavioral/Psych: Mood is stable, no new changes (+) Dementia, stable  All other systems were reviewed with the patient and are negative.  PHYSICAL EXAMINATION:  ECOG PERFORMANCE STATUS: 1 - Symptomatic but completely ambulatory  Vitals:   12/08/17 0959  BP: (!) 165/88  Pulse: 70  Resp: 18  Temp: (!) 96.6 F (  35.9 C)  SpO2: 100%   Filed Weights   12/08/17 0959  Weight: 116 lb 4.8 oz (52.8 kg)     GENERAL:alert, no distress and comfortable SKIN: skin color, texture, turgor are normal, no rashes or significant lesions EYES:  normal, conjunctiva are pink and non-injected, sclera clear (+) difficult to evaluate due to her dementia OROPHARYNX:no exudate (+) Upper and lower dentures  (+) slight color change of tongue  NECK: supple, thyroid normal size, non-tender, without nodularity LYMPH:  no palpable lymphadenopathy in the cervical, axillary or inguinal LUNGS: clear to auscultation and percussion with normal breathing effort HEART: regular rate & rhythm and no murmurs and no lower extremity edema ABDOMEN:abdomen soft, non-tender and normal bowel sounds  Musculoskeletal:no cyanosis of digits and no clubbing  PSYCH: alert and with fluent speech, (+) dementia  NEURO: no focal motor/sensory deficits  LABORATORY DATA:  I have reviewed the data as listed CBC Latest Ref Rng & Units 12/08/2017 11/24/2017 11/10/2017  WBC 3.9 - 10.3 K/uL 6.7 6.1 6.8  Hemoglobin 11.6 - 15.9 g/dL 10.3(L) 11.0(L) 10.8(L)  Hematocrit 34.8 - 46.6 % 31.1(L) 34.4(L) 33.0(L)  Platelets 145 - 400 K/uL 288 303 310    CMP Latest Ref Rng & Units 11/24/2017 11/10/2017 10/27/2017  Glucose 70 - 99 mg/dL 74 86 85  BUN 8 - 23 mg/dL '14 21 16  '$ Creatinine 0.44 - 1.00 mg/dL 1.05(H) 1.15(H) 1.30(H)  Sodium 135 - 145 mmol/L 144 140 140  Potassium 3.5 - 5.1 mmol/L 4.1 4.0 4.1  Chloride 98 - 111 mmol/L 109 108 106  CO2 22 - 32 mmol/L '25 24 27  '$ Calcium 8.9 - 10.3 mg/dL 9.1 9.1 8.9  Total Protein 6.5 - 8.1 g/dL 6.9 6.9 6.6  Total Bilirubin 0.3 - 1.2 mg/dL 0.4 0.3 0.4  Alkaline Phos 38 - 126 U/L 88 86 72  AST 15 - 41 U/L '16 19 17  '$ ALT 0 - 44 U/L '7 7 12    '$ CEA  01/23/2015: 5.7 06/05/2015: 6.1 08/04/2015: 10.3 10/12/2015: 13.29 (in-house)  11/04/2015: 12.83 01/13/2016: 8.57 02/24/2016: 8.41 04/27/2016: 9.22 05/20/2016: 8.32 06/17/2016: 6.45 07/15/2016: 5.07 07/29/2016: 6.51 09/09/16: 7.47 10/07/16: 7.85 11/04/16: 7.75 12/02/16: 8.13 01/06/17: 10.36 02/03/17: 12.60 03/03/18: 9.96  03/31/17: 12.7 04/14/17: 11.93 05/12/17: 13.00 06/09/17: 14.97 07/07/17: 15.22 08/04/17:  17.26 09/01/17: 21.94 09/29/17: 25.89 10/27/17: 40.96 11/24/17: 44.80   PATHOLOGY REPORT: Diagnosis 01/23/2015 Colon, segmental resection, with terminal ileum and cecum - INVASIVE ADENOCARCINOMA EXTENDING INTO PERICECAL CONNECTIVE TISSUE AND INVOLVING SMALL INTESTINE. - THREE BENIGN LYMPH NODES (0/3). - SEE ONCOLOGY TABLE BELOW.  Microscopic Comment COLON AND RECTUM (INCLUDING TRANS-ANAL RESECTION): Specimen: Terminal ileum and cecum. Procedure: Resection. Tumor site: Cecum adjacent to appendix. Specimen integrity: Intact. Macroscopic intactness of mesorectum: Not applicable. Macroscopic tumor perforation: No. Invasive tumor: Maximum size: 4.0 cm. Histologic type(s): Colorectal adenocarcinoma. Histologic grade and differentiation: G2: moderately differentiated/low grade. Type of polyp in which invasive carcinoma arose: No residual polyp. Microscopic extension of invasive tumor: Into pericecal connective tissue and terminal ileum. Lymph-Vascular invasion: Not identified. Peri-neural invasion: Not identified. Tumor deposit(s) (discontinuous extramural extension): No. Resection margins: Proximal margin: Free of tumor. Distal margin: Free of tumor. Circumferential (radial) (posterior ascending, posterior descending; lateral and posterior mid-rectum; and entire lower 1/3 rectum): Free of tumor. Mesenteric margin (sigmoid and transverse): N/A. Distance closest margin (if all above margins negative): N/A. Treatment effect (neo-adjuvant therapy): No. Additional polyp(s): No. Non-neoplastic findings: N/A. Lymph nodes: number examined 3; number positive: 0. Pathologic Staging: pT4b, pN0, pMX. Ancillary studies:  Mismatch repair protein by immunohistochemistry pending. Comments: There is a colorectal-type adenocarcinoma arising in the cecum in the area of the appendiceal orifice. The tumor extends into the pericecal connective tissue, and involves the attached segment of terminal  ileum. Immunohistochemistry shows the tumor is positive with CDX2 and cytokeratin 20, and negative with cytokeratin 7, estrogen receptor and WT-1 supporting a diagnosis of primary colorectal adenocarcinoma. ADDITIONAL INFORMATION: Mismatch Repair (MMR) Protein Immunohistochemistry (IHC) IHC Expression Result: MLH1: Preserved nuclear expression (greater 50% tumor expression) MSH2: Preserved nuclear expression (greater 50% tumor expression) MSH6: Preserved nuclear expression (greater 50% tumor expression) PMS2: Preserved nuclear expression (greater 50% tumor expression) * Internal control demonstrates intact nuclear expression Interpretation: NORMAL There is preserved expression of the major and minor MMR proteins. There is a very low probability that microsatellite instability (MSI) is present. However, certain clinically significant MMR protein mutations may result in preservation of nuclear expression. It is recommended that the preservation of protein expression be correlated with molecular based MSI testing.    RADIOGRAPHIC STUDIES: I have personally reviewed the radiological images as listed and agreed with the findings in the report.   PET 09/27/17 IMPRESSION 1. Relatively stable exam with no clear trend towards worsening disease or generalized improvement 2. Scattered tiny bilateral pulmonary nodules without substantial interval change 3. Index hypermetabolic lesions in the liver, omentum, right lower quadrant mesentery, and anterior abdominal wall show similar hypermetabolism to the prior study. 4.  Aortic Atherosclerois (ICD10-170.0) 5.  Emphysema. (HEN27-P82.9)    PET, 07/03/2017 IMPRESSION: 1. General mild worsening, with increased activity in the hepatic metastatic lesions, mesenteric and omental nodularity, and with new/worsening metastatic nodularity in the right lower quadrant below the cecum. 2. There are over 20 small pulmonary nodules which are below sensitive  PET-CT size thresholds and stable from the prior exam, but not currently hypermetabolic. 3. Other imaging findings of potential clinical significance: Aortic Atherosclerosis (ICD10-I70.0) and Emphysema (ICD10-J43.9). Coronary atherosclerosis. Uterine fibroid. Small amount of free pelvic fluid in the cul-de-sac. Old pelvic deformities from prior fractures. Degenerative hip and glenohumeral arthropathy. Spondylosis.  PET 03/13/17 IMPRESSION: 1. Hepatic metastases are stable in size with stable to minimally decreased hypermetabolism. 2. Inferior left rectus abdominus metastasis shows mildly decreased hypermetabolism. 3. Omental nodule no longer shows abnormal hypermetabolism. 4. Scattered pulmonary nodules are stable in size and too small for PET resolution. 5.  Aortic atherosclerosis (ICD10-170.0). 6.  Emphysema (ICD10-J43.9).  PET 11/25/16 IMPRESSION: 1. Interval worsening in the interval. Numerous pulmonary nodules are again seen. While multiple nodules are stable, there are several nodules which are a little larger in the interval. They remain too small to completely characterize with PET-CT. The 2 hepatic metastases demonstrate increased FDG uptake on today's study compared to the previous study. Abdominal wall and peritoneal metastases are mildly worsened.  PET 08/09/2016 IMPRESSION: 1. Overall mild improvement compared to FDG PET scan of 10/28/2015. 2. Pulmonary nodules are increased in size from PET-CT scan and similar size to most recent CT scan. Largest lesion RIGHT lower lobe does have associated faint metabolic activity consistent with pulmonary metastasis. 3. Decrease in metabolic activity of RIGHT hepatic lobe lesion. Potential second lesion LEFT hepatic lobe with slightly lower metabolic activity. 4. Decreased metabolic activity at the enteric colonic anastomosis in the RIGHT lower quadrant. 5. Interval decrease in metabolic activity of peritoneal nodular metastasis. 6. Nodular  implant along ventral abdominal wall may be within the musculature, but also improved.  CT CAP w Contrast 05/19/2016 IMPRESSION: 1. Numerous small solid pulmonary  nodules, one of which is new, and several of which have mildly increased in size since 10/28/2015, worrisome for mild progression of pulmonary metastases. 2. Solitary right liver lobe metastasis is mildly increased in size since 01/08/2016. 3. Peritoneal metastases have mildly increased in size since 01/08/2016, largest in the right pelvic sidewall. 4. Well-positioned right nephroureteral stent without overt right hydronephrosis. 5. Additional findings include aortic atherosclerosis, 1 vessel coronary atherosclerosis and mild to moderate emphysema.  Ct chest, Abdomen Pelvis Wo Contrast 01/08/2016  IMPRESSION: 1. 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 2. Hepatic and renal cysts. 3.  Aortoiliac atherosclerotic vascular disease. 4. Lumbar spondylosis and degenerative disc disease with multilevel impingement.  ASSESSMENT & PLAN: 77 y.o. African-American female, with past medical history of moderate dementia, independent with ADLs, hypertension, presents with small bowel obstruction and weight loss.  1. 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 -I personally reviewed her recent PET scan images, unfortunately the scan showed hypermetabolic peritoneal and liver metastases. Given her elevated CEA, normal CA125, this is most consistent with recurrent colon cancer -We previously discussed is that her prostatic colon cancer is not curable at this stage, and the treatment goal is  palliative. 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 The tumor has MSI stable, she is not a candidate for immunotherapy, although she may be a candidate for clinical trial of immunotherapy plus MEK inhibitor at [facility]. -She started Xeloda '1500mg'$  bid, 2 weeks on and one week off on 11/11/15 and stopped 05/20/16 due to disease progression.  -I previously reviewed her restaging CT scan from 01/08/2016, which showed overall stable disease, no new lesions.  -I previously reviewed the CT from 05/19/2016 with the patient and her daughter in detail. She has had a moderate disease progression in lungs, liver and peritoneum. I recommended her to change treatment. -She started second line single agent irinotecan on 06/03/16 at a reduced dose '150mg'$ /m2, tolerated well. - PET Scan image from 08/09/16 showed good partial response to treatment. We will continue irinotecan and Avastin. -We previously discussed her 11/25/16 PET scan. She has small increase in size of some of her small lung nodules, and mild increased FDG of her liver mets, but overall stable disease. I think the benefit of current treatment is getting smaller. We may need to change treatment in 2 months after a repeat scan. Will continue Irinotecan and Avastin for now. She agrees.   -PET scan on 03/13/17 revealed that hepatic metastases are stable in size with stable to minimally decreased hypermetabolism, the inferior left rectus abdominus metastasis shows mildly decreased hypermetabolism, the omental nodule no longer shows abnormal hypermetabolism. Scattered pulmonary nodules are stable in size and too small for PET resolution. 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 Previously discussed with pt and daughter. They are very pleased. -Her urine proteins were previously elevated at above 300 and Avastin has been held intermittently since  03/17/17 if urine protein >=300.  -PET Scan 07/03/2017 reviewed mild disease progression, with mainly increased FDG uptake, the overall metastatic lesion sizes have not changed significantly.  -If she has further  disease progression or she experiences side effects from chemo, I will stop chemo treatment. I do not recommend third line chemo, due to the low benefit and her medical conditions especially dementia. -We previously reviewed her 09/27/17 PET which is relatively stable without clear evidence of disease progression in the chest, abdomen, or pelvis; and no new findings. Given her overall good tolerance and performance status I recommend to continue treatment on the current regimen. -Labs revewied today, Hg at 10.3, Lymphocytes at 0.8, Urine Protein elevated at 300. CMP is still penidng. Overall adeqaute to proceed with Irinotecan today, no avastin.  -Patient's daughter reports irritability for a few days after chemo, possibly related to steroids, I will decrease premedication dexamethasone from 10 mg to 5 mg from next cycle. -Plan to repeat her PET scan last week of October -F/u with me on 11/1   2. CKD stage III, right hydroureter  -A previous CT scan showed right hydroureter, which probably contributes to her renal insufficiency. -she had ureter stent placement again in 12/2015, 03/2016, 04/2016, and 04/2017 -Continue to follow up with urology  3. HTN, dementia   -She will continue her medication and follow-up with her primary care physician. -Her BP has been better controlled lately, we previously discussed that Avastin can cause hypertension, we'll continue monitoring closely. -Her dementia had got worse gradually, which has increased her care burden on her daughter. I previously discussed burn-out prevention with her daughter, she appreciated and now doing better with home assistance for her mother.  -Dementia better on Seroquel, continue  -BP at 165/88 today (12/08/17). I reviewed Avastin  can cause high blood pressure. Will monitor.    4. Goal of care discussion  -We previously discussed the incurable nature of her cancer, and the overall poor prognosis, especially if she has progress on chemo -The patient understands the goal of care is palliative. -Due to her advanced age and comorbidities, especially dementia, I have low threshold to switch her to palliative care alone if she does not tolerate chemo well or dementia gets worse, her daughter is on -board  -I recommend DNR/DNI, pt and her daughter agrees.  -She has a caregiver and is under advanced home care. They plan to continue with chemotherapy as long as she can tolerate it.   5. Insomnia  -I first suggested melatonin over-the counter and to check with her neurologist. If that does not help she can use prescription medication.  -Suggested she increase benadryl to 1.5-2 tabs as needed. She will try white noise machine for non-pharm intervention. -Much better sleeping on Seroquel, stable    6. Social support -Due to her dementia and chemotherapy, she requires 24/7 care at home. Her daughter is her only caregiver. I previously sent referrals to aid in getting pt home assistance.  -She started advanced homecare on 08/07/17. She was discharged from her home care nurse and her PT finished recently. Her Speech therapy is going well.   -She has a care giver Debora who comes in 2-3 days/week   Plan -Patient is clinically doing well, labs reviewed, adequate for treatment, will proceed Irinotecan, no Avastin today due to proteinuria, and continue every 2 weeks, will decrease her premedication dexamethasone from 10 mg to 5 mg from next cycle. -PET last week of Oct  -F/u with me on 11/1   All questions were answered. The patient knows to call the clinic with any problems, questions or concerns.  I spent 20 minutes counseling the patient face to face. The total  time spent in the appointment was 25 minutes and more than 50% was on  counseling.  Oneal Deputy, am acting as scribe for [name], MD.   I have reviewed the above documentation for accuracy and completeness, and I agree with the above.      [name]  12/08/2017

## 2017-12-08 ENCOUNTER — Inpatient Hospital Stay (HOSPITAL_BASED_OUTPATIENT_CLINIC_OR_DEPARTMENT_OTHER): Payer: Medicare Other | Admitting: Hematology

## 2017-12-08 ENCOUNTER — Inpatient Hospital Stay: Payer: Medicare Other

## 2017-12-08 ENCOUNTER — Inpatient Hospital Stay: Payer: Medicare Other | Attending: Hematology

## 2017-12-08 ENCOUNTER — Telehealth: Payer: Self-pay | Admitting: Hematology

## 2017-12-08 ENCOUNTER — Telehealth: Payer: Self-pay

## 2017-12-08 VITALS — BP 165/88 | HR 70 | Temp 96.6°F | Resp 18 | Ht 66.0 in | Wt 116.3 lb

## 2017-12-08 DIAGNOSIS — Z23 Encounter for immunization: Secondary | ICD-10-CM | POA: Diagnosis not present

## 2017-12-08 DIAGNOSIS — Z7982 Long term (current) use of aspirin: Secondary | ICD-10-CM | POA: Diagnosis not present

## 2017-12-08 DIAGNOSIS — C182 Malignant neoplasm of ascending colon: Secondary | ICD-10-CM | POA: Diagnosis not present

## 2017-12-08 DIAGNOSIS — I129 Hypertensive chronic kidney disease with stage 1 through stage 4 chronic kidney disease, or unspecified chronic kidney disease: Secondary | ICD-10-CM | POA: Insufficient documentation

## 2017-12-08 DIAGNOSIS — M1990 Unspecified osteoarthritis, unspecified site: Secondary | ICD-10-CM | POA: Diagnosis not present

## 2017-12-08 DIAGNOSIS — Z5111 Encounter for antineoplastic chemotherapy: Secondary | ICD-10-CM | POA: Insufficient documentation

## 2017-12-08 DIAGNOSIS — N183 Chronic kidney disease, stage 3 (moderate): Secondary | ICD-10-CM | POA: Diagnosis not present

## 2017-12-08 DIAGNOSIS — D638 Anemia in other chronic diseases classified elsewhere: Secondary | ICD-10-CM

## 2017-12-08 DIAGNOSIS — Z79899 Other long term (current) drug therapy: Secondary | ICD-10-CM | POA: Insufficient documentation

## 2017-12-08 DIAGNOSIS — R197 Diarrhea, unspecified: Secondary | ICD-10-CM | POA: Insufficient documentation

## 2017-12-08 DIAGNOSIS — F0390 Unspecified dementia without behavioral disturbance: Secondary | ICD-10-CM | POA: Insufficient documentation

## 2017-12-08 DIAGNOSIS — Z87891 Personal history of nicotine dependence: Secondary | ICD-10-CM | POA: Insufficient documentation

## 2017-12-08 DIAGNOSIS — E785 Hyperlipidemia, unspecified: Secondary | ICD-10-CM | POA: Insufficient documentation

## 2017-12-08 DIAGNOSIS — C787 Secondary malignant neoplasm of liver and intrahepatic bile duct: Secondary | ICD-10-CM

## 2017-12-08 DIAGNOSIS — C786 Secondary malignant neoplasm of retroperitoneum and peritoneum: Secondary | ICD-10-CM | POA: Diagnosis not present

## 2017-12-08 DIAGNOSIS — I10 Essential (primary) hypertension: Secondary | ICD-10-CM

## 2017-12-08 DIAGNOSIS — Z8041 Family history of malignant neoplasm of ovary: Secondary | ICD-10-CM | POA: Insufficient documentation

## 2017-12-08 DIAGNOSIS — I700 Atherosclerosis of aorta: Secondary | ICD-10-CM | POA: Insufficient documentation

## 2017-12-08 DIAGNOSIS — R630 Anorexia: Secondary | ICD-10-CM | POA: Diagnosis not present

## 2017-12-08 LAB — CBC WITH DIFFERENTIAL/PLATELET
Basophils Absolute: 0.1 10*3/uL (ref 0.0–0.1)
Basophils Relative: 1 %
Eosinophils Absolute: 0.3 10*3/uL (ref 0.0–0.5)
Eosinophils Relative: 4 %
HEMATOCRIT: 31.1 % — AB (ref 34.8–46.6)
HEMOGLOBIN: 10.3 g/dL — AB (ref 11.6–15.9)
LYMPHS ABS: 0.8 10*3/uL — AB (ref 0.9–3.3)
LYMPHS PCT: 13 %
MCH: 29.8 pg (ref 25.1–34.0)
MCHC: 33.2 g/dL (ref 31.5–36.0)
MCV: 89.6 fL (ref 79.5–101.0)
MONO ABS: 0.4 10*3/uL (ref 0.1–0.9)
MONOS PCT: 6 %
NEUTROS ABS: 5.1 10*3/uL (ref 1.5–6.5)
NEUTROS PCT: 76 %
Platelets: 288 10*3/uL (ref 145–400)
RBC: 3.47 MIL/uL — ABNORMAL LOW (ref 3.70–5.45)
RDW: 19 % — AB (ref 11.2–14.5)
WBC: 6.7 10*3/uL (ref 3.9–10.3)

## 2017-12-08 LAB — COMPREHENSIVE METABOLIC PANEL
ALK PHOS: 77 U/L (ref 38–126)
ALT: 10 U/L (ref 0–44)
ANION GAP: 8 (ref 5–15)
AST: 18 U/L (ref 15–41)
Albumin: 3 g/dL — ABNORMAL LOW (ref 3.5–5.0)
BUN: 20 mg/dL (ref 8–23)
CALCIUM: 8.3 mg/dL — AB (ref 8.9–10.3)
CO2: 24 mmol/L (ref 22–32)
Chloride: 109 mmol/L (ref 98–111)
Creatinine, Ser: 1.02 mg/dL — ABNORMAL HIGH (ref 0.44–1.00)
GFR calc non Af Amer: 52 mL/min — ABNORMAL LOW (ref >60)
GLUCOSE: 86 mg/dL (ref 70–99)
POTASSIUM: 3.4 mmol/L — AB (ref 3.5–5.1)
SODIUM: 141 mmol/L (ref 135–145)
Total Bilirubin: 0.3 mg/dL (ref 0.3–1.2)
Total Protein: 6.3 g/dL — ABNORMAL LOW (ref 6.5–8.1)

## 2017-12-08 LAB — TOTAL PROTEIN, URINE DIPSTICK: PROTEIN: 300 mg/dL — AB

## 2017-12-08 MED ORDER — SODIUM CHLORIDE 0.9 % IV SOLN
Freq: Once | INTRAVENOUS | Status: AC
  Administered 2017-12-08: 12:00:00 via INTRAVENOUS
  Filled 2017-12-08: qty 250

## 2017-12-08 MED ORDER — PALONOSETRON HCL INJECTION 0.25 MG/5ML
INTRAVENOUS | Status: AC
  Filled 2017-12-08: qty 5

## 2017-12-08 MED ORDER — HEPARIN SOD (PORK) LOCK FLUSH 100 UNIT/ML IV SOLN
500.0000 [IU] | Freq: Once | INTRAVENOUS | Status: AC | PRN
  Administered 2017-12-08: 500 [IU]
  Filled 2017-12-08: qty 5

## 2017-12-08 MED ORDER — DEXAMETHASONE SODIUM PHOSPHATE 10 MG/ML IJ SOLN
INTRAMUSCULAR | Status: AC
  Filled 2017-12-08: qty 1

## 2017-12-08 MED ORDER — ATROPINE SULFATE 1 MG/ML IJ SOLN
INTRAMUSCULAR | Status: AC
  Filled 2017-12-08: qty 1

## 2017-12-08 MED ORDER — INFLUENZA VAC SPLIT QUAD 0.5 ML IM SUSY: 0.5000 mL | PREFILLED_SYRINGE | Freq: Once | INTRAMUSCULAR | Status: DC

## 2017-12-08 MED ORDER — ATROPINE SULFATE 1 MG/ML IJ SOLN
0.5000 mg | Freq: Once | INTRAMUSCULAR | Status: AC | PRN
  Administered 2017-12-08: 0.5 mg via INTRAVENOUS

## 2017-12-08 MED ORDER — DEXAMETHASONE SODIUM PHOSPHATE 10 MG/ML IJ SOLN
10.0000 mg | Freq: Once | INTRAMUSCULAR | Status: AC
  Administered 2017-12-08: 10 mg via INTRAVENOUS

## 2017-12-08 MED ORDER — SODIUM CHLORIDE 0.9% FLUSH
10.0000 mL | INTRAVENOUS | Status: DC | PRN
  Administered 2017-12-08: 10 mL
  Filled 2017-12-08: qty 10

## 2017-12-08 MED ORDER — PALONOSETRON HCL INJECTION 0.25 MG/5ML
0.2500 mg | Freq: Once | INTRAVENOUS | Status: AC
  Administered 2017-12-08: 0.25 mg via INTRAVENOUS

## 2017-12-08 MED ORDER — SODIUM CHLORIDE 0.9% FLUSH
10.0000 mL | Freq: Once | INTRAVENOUS | Status: AC
  Administered 2017-12-08: 10 mL
  Filled 2017-12-08: qty 10

## 2017-12-08 MED ORDER — IRINOTECAN HCL CHEMO INJECTION 100 MG/5ML
150.0000 mg/m2 | Freq: Once | INTRAVENOUS | Status: AC
  Administered 2017-12-08: 240 mg via INTRAVENOUS
  Filled 2017-12-08: qty 12

## 2017-12-08 MED ORDER — INFLUENZA VAC SPLIT HIGH-DOSE 0.5 ML IM SUSY
0.5000 mL | PREFILLED_SYRINGE | Freq: Once | INTRAMUSCULAR | Status: AC
  Administered 2017-12-08: 0.5 mL via INTRAMUSCULAR
  Filled 2017-12-08: qty 0.5

## 2017-12-08 NOTE — Telephone Encounter (Signed)
Spoke with patient's daughter Lorriane Shire per Dr. Burr Medico will cut her Dexamethasone in half to see if that helps her aggressive behavior.   She verbalized an understanding.

## 2017-12-08 NOTE — Telephone Encounter (Signed)
Appts scheduled avs/calendar printed per 10/4 los °

## 2017-12-08 NOTE — Patient Instructions (Signed)
[facility] Discharge Instructions for Patients Receiving Chemotherapy  Today you received the following chemotherapy agents: Irinotecan   To help prevent nausea and vomiting after your treatment, we encourage you to take your nausea medication as directed.    If you develop nausea and vomiting that is not controlled by your nausea medication, call the clinic.   BELOW ARE SYMPTOMS THAT SHOULD BE REPORTED IMMEDIATELY:  *FEVER GREATER THAN 100.5 F  *CHILLS WITH OR WITHOUT FEVER  NAUSEA AND VOMITING THAT IS NOT CONTROLLED WITH YOUR NAUSEA MEDICATION  *UNUSUAL SHORTNESS OF BREATH  *UNUSUAL BRUISING OR BLEEDING  TENDERNESS IN MOUTH AND THROAT WITH OR WITHOUT PRESENCE OF ULCERS  *URINARY PROBLEMS  *BOWEL PROBLEMS  UNUSUAL RASH Items with * indicate a potential emergency and should be followed up as soon as possible.  Feel free to call the clinic should you have any questions or concerns. The clinic phone number is [phone].  Please show the CHEMO ALERT CARD at check-in to the Emergency Department and triage nurse.   

## 2017-12-09 ENCOUNTER — Encounter: Payer: Self-pay | Admitting: Hematology

## 2017-12-12 ENCOUNTER — Telehealth: Payer: Self-pay | Admitting: *Deleted

## 2017-12-12 NOTE — Telephone Encounter (Signed)
Returned call to [employer] daughter Beth Wilkins [phone]) report of "mom experiencing blood in urine".  "Dr. Burr Medico asked that we call if mom has blood in urine.  Noticed this morning for the first time.  No brown doodie, just red color in her urine.  Temp = 96.8.  No pain.  She's not disoriented.  Called urologist Dr. Rosie Fate who is not in today.  His triage nurse says this happens with ureteral stents.  We're increasing water intake to flush her out.  Has not peed again.  She's dressed and ready to go out with her caregiver Jackelyn Poling."

## 2017-12-12 NOTE — Telephone Encounter (Signed)
Malachy Mood, please f/u tomorrow to see if she still has hematuria. Thanks   [name] MD

## 2017-12-13 MED FILL — MYRBETRIQ ER 50 MG TABLET: 50 | 30 days supply | Qty: 30 | Fill #0

## 2017-12-13 MED FILL — MIRTAZAPINE 15 MG TABLET: 15 | 30 days supply | Qty: 30 | Fill #3

## 2017-12-13 MED FILL — QUETIAPINE 25 MG TABLET: 25 | 30 days supply | Qty: 30 | Fill #4

## 2017-12-13 NOTE — Telephone Encounter (Signed)
Spoke with patient's daughter seems hematuria has resolved, no fevers.  Explained to call us if patient develops painful urination, fever, or blood in urine, she verbalized an understanding.

## 2017-12-22 ENCOUNTER — Other Ambulatory Visit: Payer: Self-pay

## 2017-12-22 ENCOUNTER — Inpatient Hospital Stay: Payer: Medicare Other

## 2017-12-22 VITALS — BP 154/83 | HR 84 | Temp 97.6°F | Resp 18

## 2017-12-22 DIAGNOSIS — Z5111 Encounter for antineoplastic chemotherapy: Secondary | ICD-10-CM | POA: Diagnosis not present

## 2017-12-22 DIAGNOSIS — C182 Malignant neoplasm of ascending colon: Secondary | ICD-10-CM

## 2017-12-22 DIAGNOSIS — R319 Hematuria, unspecified: Secondary | ICD-10-CM

## 2017-12-22 LAB — URINALYSIS, COMPLETE (UACMP) WITH MICROSCOPIC
BILIRUBIN URINE: NEGATIVE
Glucose, UA: NEGATIVE mg/dL
Ketones, ur: NEGATIVE mg/dL
NITRITE: NEGATIVE
Protein, ur: 30 mg/dL — AB
Specific Gravity, Urine: 1.003 — ABNORMAL LOW (ref 1.005–1.030)
pH: 7 (ref 5.0–8.0)

## 2017-12-22 LAB — CBC WITH DIFFERENTIAL/PLATELET
Abs Immature Granulocytes: 0.02 10*3/uL (ref 0.00–0.07)
BASOS ABS: 0.1 10*3/uL (ref 0.0–0.1)
Basophils Relative: 1 %
EOS PCT: 5 %
Eosinophils Absolute: 0.3 10*3/uL (ref 0.0–0.5)
HCT: 33.5 % — ABNORMAL LOW (ref 36.0–46.0)
HEMOGLOBIN: 10.5 g/dL — AB (ref 12.0–15.0)
Immature Granulocytes: 0 %
LYMPHS PCT: 21 %
Lymphs Abs: 1.4 10*3/uL (ref 0.7–4.0)
MCH: 28.4 pg (ref 26.0–34.0)
MCHC: 31.3 g/dL (ref 30.0–36.0)
MCV: 90.5 fL (ref 80.0–100.0)
Monocytes Absolute: 0.5 10*3/uL (ref 0.1–1.0)
Monocytes Relative: 7 %
NEUTROS PCT: 66 %
NRBC: 0 % (ref 0.0–0.2)
Neutro Abs: 4.3 10*3/uL (ref 1.7–7.7)
Platelets: 342 10*3/uL (ref 150–400)
RBC: 3.7 MIL/uL — AB (ref 3.87–5.11)
RDW: 17.6 % — AB (ref 11.5–15.5)
WBC: 6.5 10*3/uL (ref 4.0–10.5)

## 2017-12-22 LAB — COMPREHENSIVE METABOLIC PANEL
ALBUMIN: 3.3 g/dL — AB (ref 3.5–5.0)
ALK PHOS: 87 U/L (ref 38–126)
ALT: 9 U/L (ref 0–44)
ANION GAP: 8 (ref 5–15)
AST: 21 U/L (ref 15–41)
BUN: 14 mg/dL (ref 8–23)
CO2: 26 mmol/L (ref 22–32)
Calcium: 9 mg/dL (ref 8.9–10.3)
Chloride: 106 mmol/L (ref 98–111)
Creatinine, Ser: 1.24 mg/dL — ABNORMAL HIGH (ref 0.44–1.00)
GFR calc Af Amer: 48 mL/min — ABNORMAL LOW (ref >60)
GFR calc non Af Amer: 41 mL/min — ABNORMAL LOW (ref >60)
GLUCOSE: 88 mg/dL (ref 70–99)
POTASSIUM: 3.8 mmol/L (ref 3.5–5.1)
SODIUM: 140 mmol/L (ref 135–145)
Total Bilirubin: 0.3 mg/dL (ref 0.3–1.2)
Total Protein: 7 g/dL (ref 6.5–8.1)

## 2017-12-22 LAB — CEA (IN HOUSE-CHCC): CEA (CHCC-In House): 50.3 ng/mL — ABNORMAL HIGH (ref 0.00–5.00)

## 2017-12-22 MED ORDER — IRINOTECAN HCL CHEMO INJECTION 100 MG/5ML
150.0000 mg/m2 | Freq: Once | INTRAVENOUS | Status: AC
  Administered 2017-12-22: 240 mg via INTRAVENOUS
  Filled 2017-12-22: qty 12

## 2017-12-22 MED ORDER — ATROPINE SULFATE 1 MG/ML IJ SOLN
INTRAMUSCULAR | Status: AC
  Filled 2017-12-22: qty 1

## 2017-12-22 MED ORDER — SODIUM CHLORIDE 0.9% FLUSH
10.0000 mL | INTRAVENOUS | Status: DC | PRN
  Administered 2017-12-22: 10 mL
  Filled 2017-12-22: qty 10

## 2017-12-22 MED ORDER — PALONOSETRON HCL INJECTION 0.25 MG/5ML
INTRAVENOUS | Status: AC
  Filled 2017-12-22: qty 5

## 2017-12-22 MED ORDER — DEXAMETHASONE SODIUM PHOSPHATE 10 MG/ML IJ SOLN
INTRAMUSCULAR | Status: AC
  Filled 2017-12-22: qty 1

## 2017-12-22 MED ORDER — SODIUM CHLORIDE 0.9 % IV SOLN
Freq: Once | INTRAVENOUS | Status: DC
  Filled 2017-12-22: qty 250

## 2017-12-22 MED ORDER — PALONOSETRON HCL INJECTION 0.25 MG/5ML
0.2500 mg | Freq: Once | INTRAVENOUS | Status: AC
  Administered 2017-12-22: 0.25 mg via INTRAVENOUS

## 2017-12-22 MED ORDER — SODIUM CHLORIDE 0.9 % IV SOLN
5.0000 mg/kg | Freq: Once | INTRAVENOUS | Status: AC
  Administered 2017-12-22: 275 mg via INTRAVENOUS
  Filled 2017-12-22: qty 11

## 2017-12-22 MED ORDER — DEXAMETHASONE SODIUM PHOSPHATE 10 MG/ML IJ SOLN
5.0000 mg | Freq: Once | INTRAMUSCULAR | Status: AC
  Administered 2017-12-22: 5 mg via INTRAVENOUS

## 2017-12-22 MED ORDER — HEPARIN SOD (PORK) LOCK FLUSH 100 UNIT/ML IV SOLN
500.0000 [IU] | Freq: Once | INTRAVENOUS | Status: AC | PRN
  Administered 2017-12-22: 500 [IU]
  Filled 2017-12-22: qty 5

## 2017-12-22 MED ORDER — SODIUM CHLORIDE 0.9 % IV SOLN
Freq: Once | INTRAVENOUS | Status: AC
  Administered 2017-12-22: 13:00:00 via INTRAVENOUS
  Filled 2017-12-22: qty 250

## 2017-12-22 MED ORDER — SODIUM CHLORIDE 0.9% FLUSH
10.0000 mL | Freq: Once | INTRAVENOUS | Status: AC
  Administered 2017-12-22: 10 mL
  Filled 2017-12-22: qty 10

## 2017-12-22 MED ORDER — ATROPINE SULFATE 1 MG/ML IJ SOLN
0.5000 mg | Freq: Once | INTRAMUSCULAR | Status: AC | PRN
  Administered 2017-12-22: 0.5 mg via INTRAVENOUS

## 2017-12-22 NOTE — Progress Notes (Signed)
Alerted MD Burr Medico of urine values, ok to treat today.

## 2017-12-22 NOTE — Patient Instructions (Signed)
[facility] Discharge Instructions for Patients Receiving Chemotherapy  Today you received the following chemotherapy agents: Avastin & Irinotecan  To help prevent nausea and vomiting after your treatment, we encourage you to take your nausea medication as directed   If you develop nausea and vomiting that is not controlled by your nausea medication, call the clinic.   BELOW ARE SYMPTOMS THAT SHOULD BE REPORTED IMMEDIATELY:  *FEVER GREATER THAN 100.5 F  *CHILLS WITH OR WITHOUT FEVER  NAUSEA AND VOMITING THAT IS NOT CONTROLLED WITH YOUR NAUSEA MEDICATION  *UNUSUAL SHORTNESS OF BREATH  *UNUSUAL BRUISING OR BLEEDING  TENDERNESS IN MOUTH AND THROAT WITH OR WITHOUT PRESENCE OF ULCERS  *URINARY PROBLEMS  *BOWEL PROBLEMS  UNUSUAL RASH Items with * indicate a potential emergency and should be followed up as soon as possible.  Feel free to call the clinic should you have any questions or concerns. The clinic phone number is [phone].  Please show the [place] at check-in to the Emergency Department and triage nurse.

## 2017-12-22 NOTE — Addendum Note (Signed)
Addended by: [name] on: 12/22/2017 01:34 PM   Modules accepted: Orders

## 2017-12-23 LAB — URINE CULTURE: CULTURE: NO GROWTH

## 2018-01-01 MED FILL — AMLODIPINE BESYLATE 5 MG TA: 5 | 30 days supply | Qty: 30 | Fill #1

## 2018-01-04 ENCOUNTER — Ambulatory Visit (HOSPITAL_COMMUNITY)
Admission: RE | Admit: 2018-01-04 | Discharge: 2018-01-04 | Disposition: A | Discharge status: Home / Self Care | Payer: Medicare Other | Source: Ambulatory Visit | Attending: Hematology | Admitting: Hematology

## 2018-01-04 ENCOUNTER — Other Ambulatory Visit: Payer: Self-pay | Admitting: Hematology

## 2018-01-04 DIAGNOSIS — C182 Malignant neoplasm of ascending colon: Secondary | ICD-10-CM | POA: Diagnosis present

## 2018-01-04 LAB — GLUCOSE, CAPILLARY: GLUCOSE-CAPILLARY: 80 mg/dL (ref 70–99)

## 2018-01-04 MED ORDER — FLUDEOXYGLUCOSE F - 18 (FDG) INJECTION
5.8200 | Freq: Once | INTRAVENOUS | Status: AC | PRN
  Administered 2018-01-04: 5.82 via INTRAVENOUS

## 2018-01-05 ENCOUNTER — Telehealth: Payer: Self-pay | Admitting: Emergency Medicine

## 2018-01-05 ENCOUNTER — Telehealth: Payer: Self-pay

## 2018-01-05 ENCOUNTER — Inpatient Hospital Stay: Payer: Medicare Other

## 2018-01-05 ENCOUNTER — Encounter: Payer: Self-pay | Admitting: Nurse Practitioner

## 2018-01-05 ENCOUNTER — Inpatient Hospital Stay (HOSPITAL_BASED_OUTPATIENT_CLINIC_OR_DEPARTMENT_OTHER): Payer: Medicare Other | Admitting: Nurse Practitioner

## 2018-01-05 ENCOUNTER — Inpatient Hospital Stay: Payer: Medicare Other | Attending: Hematology

## 2018-01-05 VITALS — BP 156/87 | HR 86 | Temp 98.0°F | Resp 17 | Ht 66.0 in | Wt 116.9 lb

## 2018-01-05 DIAGNOSIS — C182 Malignant neoplasm of ascending colon: Secondary | ICD-10-CM

## 2018-01-05 DIAGNOSIS — C7801 Secondary malignant neoplasm of right lung: Secondary | ICD-10-CM | POA: Diagnosis not present

## 2018-01-05 DIAGNOSIS — F0390 Unspecified dementia without behavioral disturbance: Secondary | ICD-10-CM | POA: Insufficient documentation

## 2018-01-05 DIAGNOSIS — I129 Hypertensive chronic kidney disease with stage 1 through stage 4 chronic kidney disease, or unspecified chronic kidney disease: Secondary | ICD-10-CM | POA: Diagnosis not present

## 2018-01-05 DIAGNOSIS — C7802 Secondary malignant neoplasm of left lung: Secondary | ICD-10-CM

## 2018-01-05 DIAGNOSIS — N183 Chronic kidney disease, stage 3 (moderate): Secondary | ICD-10-CM | POA: Diagnosis not present

## 2018-01-05 DIAGNOSIS — Z5111 Encounter for antineoplastic chemotherapy: Secondary | ICD-10-CM | POA: Insufficient documentation

## 2018-01-05 DIAGNOSIS — R319 Hematuria, unspecified: Secondary | ICD-10-CM | POA: Insufficient documentation

## 2018-01-05 DIAGNOSIS — C787 Secondary malignant neoplasm of liver and intrahepatic bile duct: Secondary | ICD-10-CM | POA: Diagnosis not present

## 2018-01-05 LAB — COMPREHENSIVE METABOLIC PANEL
ALBUMIN: 3.2 g/dL — AB (ref 3.5–5.0)
ALT: 8 U/L (ref 0–44)
AST: 20 U/L (ref 15–41)
Alkaline Phosphatase: 73 U/L (ref 38–126)
Anion gap: 11 (ref 5–15)
BILIRUBIN TOTAL: 0.4 mg/dL (ref 0.3–1.2)
BUN: 17 mg/dL (ref 8–23)
CO2: 23 mmol/L (ref 22–32)
Calcium: 8.7 mg/dL — ABNORMAL LOW (ref 8.9–10.3)
Chloride: 112 mmol/L — ABNORMAL HIGH (ref 98–111)
Creatinine, Ser: 1.49 mg/dL — ABNORMAL HIGH (ref 0.44–1.00)
GFR calc Af Amer: 38 mL/min — ABNORMAL LOW (ref >60)
GFR calc non Af Amer: 33 mL/min — ABNORMAL LOW (ref >60)
Glucose, Bld: 110 mg/dL — ABNORMAL HIGH (ref 70–99)
POTASSIUM: 4.1 mmol/L (ref 3.5–5.1)
SODIUM: 146 mmol/L — AB (ref 135–145)
TOTAL PROTEIN: 6.6 g/dL (ref 6.5–8.1)

## 2018-01-05 LAB — CBC WITH DIFFERENTIAL/PLATELET
ABS IMMATURE GRANULOCYTES: 0.03 10*3/uL (ref 0.00–0.07)
Basophils Absolute: 0.1 10*3/uL (ref 0.0–0.1)
Basophils Relative: 1 %
Eosinophils Absolute: 0.3 10*3/uL (ref 0.0–0.5)
Eosinophils Relative: 4 %
HEMATOCRIT: 30.6 % — AB (ref 36.0–46.0)
HEMOGLOBIN: 9.8 g/dL — AB (ref 12.0–15.0)
IMMATURE GRANULOCYTES: 1 %
LYMPHS ABS: 1.2 10*3/uL (ref 0.7–4.0)
LYMPHS PCT: 19 %
MCH: 29.2 pg (ref 26.0–34.0)
MCHC: 32 g/dL (ref 30.0–36.0)
MCV: 91.1 fL (ref 80.0–100.0)
MONO ABS: 0.4 10*3/uL (ref 0.1–1.0)
Monocytes Relative: 7 %
NEUTROS ABS: 4.4 10*3/uL (ref 1.7–7.7)
Neutrophils Relative %: 68 %
Platelets: 338 10*3/uL (ref 150–400)
RBC: 3.36 MIL/uL — ABNORMAL LOW (ref 3.87–5.11)
RDW: 17.8 % — ABNORMAL HIGH (ref 11.5–15.5)
WBC: 6.3 10*3/uL (ref 4.0–10.5)
nRBC: 0 % (ref 0.0–0.2)

## 2018-01-05 LAB — URINALYSIS, COMPLETE (UACMP) WITH MICROSCOPIC
BACTERIA UA: NONE SEEN
BILIRUBIN URINE: NEGATIVE
Glucose, UA: NEGATIVE mg/dL
KETONES UR: NEGATIVE mg/dL
Nitrite: NEGATIVE
PH: 6 (ref 5.0–8.0)
PROTEIN: 100 mg/dL — AB
Specific Gravity, Urine: 1.012 (ref 1.005–1.030)

## 2018-01-05 LAB — TOTAL PROTEIN, URINE DIPSTICK: PROTEIN: 100 mg/dL — AB

## 2018-01-05 MED ORDER — IRINOTECAN HCL CHEMO INJECTION 100 MG/5ML
180.0000 mg/m2 | Freq: Once | INTRAVENOUS | Status: AC
  Administered 2018-01-05: 300 mg via INTRAVENOUS
  Filled 2018-01-05: qty 15

## 2018-01-05 MED ORDER — ACETAMINOPHEN 500 MG PO TABS
1000.0000 mg | ORAL_TABLET | Freq: Once | ORAL | Status: AC
  Administered 2018-01-05: 1000 mg via ORAL

## 2018-01-05 MED ORDER — SODIUM CHLORIDE 0.9 % IV SOLN
Freq: Once | INTRAVENOUS | Status: AC
  Administered 2018-01-05: 12:00:00 via INTRAVENOUS
  Filled 2018-01-05: qty 250

## 2018-01-05 MED ORDER — ATROPINE SULFATE 1 MG/ML IJ SOLN
INTRAMUSCULAR | Status: AC
  Filled 2018-01-05: qty 1

## 2018-01-05 MED ORDER — HEPARIN SOD (PORK) LOCK FLUSH 100 UNIT/ML IV SOLN
500.0000 [IU] | Freq: Once | INTRAVENOUS | Status: AC | PRN
  Administered 2018-01-05: 500 [IU]
  Filled 2018-01-05: qty 5

## 2018-01-05 MED ORDER — SODIUM CHLORIDE 0.9% FLUSH
10.0000 mL | INTRAVENOUS | Status: DC | PRN
  Administered 2018-01-05: 10 mL
  Filled 2018-01-05: qty 10

## 2018-01-05 MED ORDER — ATROPINE SULFATE 1 MG/ML IJ SOLN
0.5000 mg | Freq: Once | INTRAMUSCULAR | Status: AC | PRN
  Administered 2018-01-05: 0.5 mg via INTRAVENOUS

## 2018-01-05 MED ORDER — ACETAMINOPHEN 500 MG PO TABS
ORAL_TABLET | ORAL | Status: AC
  Filled 2018-01-05: qty 2

## 2018-01-05 MED ORDER — DEXAMETHASONE SODIUM PHOSPHATE 10 MG/ML IJ SOLN
INTRAMUSCULAR | Status: AC
  Filled 2018-01-05: qty 1

## 2018-01-05 MED ORDER — DEXAMETHASONE SODIUM PHOSPHATE 10 MG/ML IJ SOLN
5.0000 mg | Freq: Once | INTRAMUSCULAR | Status: AC
  Administered 2018-01-05: 5 mg via INTRAVENOUS

## 2018-01-05 MED ORDER — PALONOSETRON HCL INJECTION 0.25 MG/5ML
INTRAVENOUS | Status: AC
  Filled 2018-01-05: qty 5

## 2018-01-05 MED ORDER — SODIUM CHLORIDE 0.9 % IV SOLN: 5.0000 mg/kg | Freq: Once | INTRAVENOUS | Status: DC

## 2018-01-05 MED ORDER — PALONOSETRON HCL INJECTION 0.25 MG/5ML
0.2500 mg | Freq: Once | INTRAVENOUS | Status: AC
  Administered 2018-01-05: 0.25 mg via INTRAVENOUS

## 2018-01-05 NOTE — Progress Notes (Signed)
Beth Wilkins  [phone] [phone]  Clinic Follow up Note   Patient Care Team: [name], MD as PCP - General (Family Medicine) [name], MD as Consulting Physician (Medical Oncology) [name], MD as Consulting Physician (General Surgery) [name], MD as Consulting Physician (Orthopedic Surgery) [name] [name], MD as Consulting Physician (Urology) 01/05/2018  SUMMARY OF ONCOLOGIC HISTORY: Oncology History   Cancer of right colon [facility])   Staging form: Colon and Rectum, AJCC 7th Edition     Pathologic stage from 01/23/2015: Stage IIC (T4b, N0, cM0) - Signed by [name], MD on 02/05/2015       Cancer of right colon ([place])   01/21/2015 Imaging     CT abdomen and pelvis showed high-grade small bowel obstruction,  right I [place] ureteral nephrosis,  multiple liver cysts.  CT chest was negative for metastatic lesion.    01/23/2015 Pathology Results     invasive adenocarcinoma extending into the pericecal connective tissue,  grade 2, LVI (-),  no perineural invasion, 3 lymph nodes were negative.    01/23/2015 Surgery      terminal ileum and cecum seegmental resection with anastomosis by Dr. Hassell Done    01/23/2015 Initial Diagnosis    Cancer of right colon ([place])    03/13/2015 - 07/06/2015 Chemotherapy    Xeloda 1500 mg bid X 14 days on-7 off, s/p 5 cycles, stopped early due to wrosening renal function    10/28/2015 Progression    PET scan showed multiple hypermetabolic peritoneal nodules, right ovarian cystic mass, and hypermetabolic liver lesion, highly suspicious for metastatic colon cancer. Small pulmonary nodules are indeterminate.     11/11/2015 - 05/20/2016 Chemotherapy    Xeloda 157m bid, 2 weeks on and one week off, Avastin was added on from cycle 3. Xeloda held from 05/20/16 due to progression on CT from 05/19/16 and total bilirubin increased to 2.6.    01/08/2016 Imaging    Ct chest, Abdomen Pelvis Wo Contrast 01/08/2016    IMPRESSION: 1. 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 2. Hepatic and renal cysts. 3.  Aortoiliac atherosclerotic vascular disease. 4. Lumbar spondylosis and degenerative disc disease with multilevel Impingement.    05/19/2016 Imaging    CT CAP w Contrast IMPRESSION: 1. Numerous small solid pulmonary nodules, one of which is new, and several of which have mildly increased in size since 10/28/2015, worrisome for mild progression of pulmonary metastases. 2. Solitary right liver lobe metastasis is mildly increased in size since 01/08/2016. 3. Peritoneal metastases have mildly increased in size since 01/08/2016, largest in the right pelvic sidewall. 4. Well-positioned right nephroureteral stent without overt right hydronephrosis. 5. Additional findings include aortic atherosclerosis, 1 vessel coronary atherosclerosis and mild to moderate emphysema.    06/03/2016 -  Chemotherapy    Second-Line Irinotecan and Avastin every 2 weeks starting 06/03/16    07/29/2016 Tumor Marker    CEA 6.51     08/09/2016 Imaging    PET  IMPRESSION: 1. Overall mild improvement compared to FDG PET scan of 10/28/2015. 2. Pulmonary nodules are increased in size from PET-CT scan and similar size to most recent CT scan. Largest lesion RIGHT lower lobe does have associated faint metabolic activity consistent with pulmonary metastasis. 3. Decrease in metabolic activity of RIGHT hepatic lobe lesion. Potential second lesion LEFT hepatic lobe with slightly lower metabolic activity. 4. Decreased metabolic activity at the enteric colonic anastomosis in the RIGHT lower quadrant. 5. Interval decrease  in metabolic activity of peritoneal nodular metastasis. 6. Nodular implant implant along ventral abdominal wall may be within the musculature, but also improved.    09/09/2016 Tumor Marker    CEA: 7.47    10/07/2016 Tumor Marker    CEA: 7.85     11/04/2016 Tumor Marker    CEA: 7.75    11/25/2016 PET scan    PET 11/25/16 IMPRESSION: 1. Interval worsening in the interval. Numerous pulmonary nodules are again seen. While multiple nodules are stable, there are several nodules which are a little larger in the interval. They remain too small to completely characterize with PET-CT. The 2 hepatic metastases demonstrate increased FDG uptake on today's study compared to the previous study. Abdominal wall and peritoneal metastases are mildly worsened.    12/02/2016 Tumor Marker    CEA: 8.13    01/06/2017 Tumor Marker    CEA: 10.36    02/03/2017 Tumor Marker    CEA: 12.60    03/03/2017 Tumor Marker    CEA: 9.96    03/13/2017 PET scan    IMPRESSION: 1. Hepatic metastases are stable in size with stable to minimally decreased hypermetabolism. 2. Inferior left rectus abdominus metastasis shows mildly decreased hypermetabolism. 3. Omental nodule no longer shows abnormal hypermetabolism. 4. Scattered pulmonary nodules are stable in size and too small for PET resolution. 5.  Aortic atherosclerosis (ICD10-170.0). 6.  Emphysema (ICD10-J43.9).    07/03/2017 Imaging    PET, 07/03/2017 IMPRESSION: 1. General mild worsening, with increased activity in the hepatic metastatic lesions, mesenteric and omental nodularity, and with new/worsening metastatic nodularity in the right lower quadrant below the cecum. 2. There are over 20 small pulmonary nodules which are below sensitive PET-CT size thresholds and stable from the prior exam, but not currently hypermetabolic. 3. Other imaging findings of potential clinical significance: Aortic Atherosclerosis (ICD10-I70.0) and Emphysema (ICD10-J43.9). Coronary atherosclerosis. Uterine fibroid. Small amount of free pelvic fluid in the cul-de-sac. Old pelvic deformities from prior fractures. Degenerative hip and glenohumeral arthropathy. Spondylosis.    09/27/2017 PET scan    IMPRESSION: 1. Relatively stable exam  with no clear trend towards worsening disease or generalized improvement. 2. Scattered tiny bilateral pulmonary nodules without substantial interval change. 3. Index hypermetabolic lesions in the liver, omentum, right lower quadrant mesentery, and anterior abdominal wall show similar hypermetabolism to the prior study. 4.  Aortic Atherosclerois (ICD10-170.0) 5.  Emphysema. (ICD10-J43.9)     01/04/2018 PET scan    IMPRESSION: 1. Patient had some difficulty in maintaining position during scan and had difficulty following commands. There is motion degradation and patient was not imaged from the neck up. 2. Bilateral pulmonary nodules with low but measurable metabolic activity consistent pulmonary metastasis. Metabolic activity is increased from comparison exam. Nodules are similar in size. 3. Interval increase in size and metabolic activity of two hepatic metastasis. 4. Increase in metabolic activity of ventral peritoneal nodule (which appears shifted into the LEFT abdomen from the RIGHT abdomen) and increased hypermetabolic activity of nodular thickening at the surgical anastomosis in RIGHT lower quadrant. Findings consistent peritoneal metastatic disease. 5. Stable subcutaneous nodule along the ventral midline with hypermetabolic activity.     CURRENT THERAPY: Second-Line Irinotecan and Avastin every 2 weeks started 06/03/16, Avastin held as needed due to severe proteinuria. Irinotecan increased to full dose on 01/05/18 after mild disease progression on PET    INTERVAL HISTORY: Ms. Abts returns with her daughter for next cycle as scheduled. She completed another cycle irinotecan and avastin on 10/18. She is  doing well, no significant changes overall. Appetite and energy level are normal. She is staying hydrated thorughout the day. Her daughter notices first void in the morning is blood-tinged then resolves with hydration. Dr. [name] encouraged her to drink water and crystal light. No  dysuria, fever, or chills. No n/v/c/d. Daughter increases fiber on day 3 to keep stools normal. Mood is much better with lower steroids, less irritable. Denies abdominal pain. No new pain other than right knee arthritis.    MEDICAL HISTORY:  Past Medical History:  Diagnosis Date  . Anxiety   . Aortic atherosclerosis ([place]) 03/15/2017   noted on PET   . Arthritis   . Bilateral renal cysts   . Centrilobular emphysema ([place]) per 05-10-16 chest xray  . CKD (chronic kidney disease), stage III ([place])   . Colon cancer [facility]) oncologist-  dr [name]   dx 01-23-2015  right colon Invasive adenocarcinoma into pericecal connective tissue and involving small intestine , Stage IIc, Grade 2, LVI(-),  (pT4b N0 M0) /  s/p resection termial ileum and cecum and chemotherapy 01-06-217 to 07-06-2015/   08/ 2017  per PET  recurrent w/ liver mets and peritoneal carcinomatosis  . Complication of anesthesia    can be combative due to dementia  . Full dentures   . Headache    occ stress related  . History of chemotherapy 2019   last chemotherapy infusion 09/29/2017  . History of gastroesophageal reflux (GERD)    changes diet  . History of pelvic fracture 2015  . Hyperlipidemia   . Hypertension   . Liver metastasis ([place])   . Moderate dementia without behavioral disturbance ([facility])    moderate to severe dementia per neurologist note (dr Krista Blue) in epic  . N&V (nausea and vomiting)   . Pulmonary nodules   . Right ovarian cyst   . SBO (small bowel obstruction) ([place]) 01/2015  . 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 UTI (urinary tract infection) 09/26/2016   5 day antibiotic treatment    SURGICAL HISTORY: Past Surgical History:  Procedure Laterality Date  . CATARACT EXTRACTION W/ INTRAOCULAR LENS  IMPLANT, BILATERAL  2005 approx  . CYSTOSCOPY W/ URETERAL STENT PLACEMENT Right 12/03/2015   Procedure:  CYSTOSCOPY WITH RETROGRADE PYELOGRAM/URETERAL STENT PLACEMENT;  Surgeon: Cleon Gustin, MD;  Location: [facility];  Service: Urology;  Laterality: Right;  . CYSTOSCOPY W/ URETERAL STENT PLACEMENT Right 02/15/2016   Procedure: CYSTOSCOPY WITH RETROGRADE PYELOGRAM/URETERAL STENT REPLACEMENT;  Surgeon: Cleon Gustin, MD;  Location: [facility];  Service: Urology;  Laterality: Right;  . CYSTOSCOPY W/ URETERAL STENT PLACEMENT Right 03/28/2016   Procedure: CYSTOSCOPY WITH RETROGRADE PYELOGRAM/URETERAL STENT EXCHANGE;  Surgeon: Cleon Gustin, MD;  Location: [facility];  Service: Urology;  Laterality: Right;  . CYSTOSCOPY W/ URETERAL STENT PLACEMENT Right 07/18/2016   Procedure: CYSTOSCOPY WITH RETROGRADE PYELOGRAM/ STENT EXCHANGE;  Surgeon: Cleon Gustin, MD;  Location: [facility];  Service: Urology;  Laterality: Right;  . CYSTOSCOPY W/ URETERAL STENT PLACEMENT Right 10/10/2016   Procedure: CYSTOSCOPY WITH RETROGRADE PYELOGRAM/URETERAL STENT REPLACEMENT;  Surgeon: Cleon Gustin, MD;  Location: [facility];  Service: Urology;  Laterality: Right;  . CYSTOSCOPY W/ URETERAL STENT PLACEMENT Right 04/10/2017   Procedure: CYSTOSCOPY WITH RETROGRADE PYELOGRAM/URETERAL STENT EXCHANGE;  Surgeon: Cleon Gustin, MD;  Location: [facility];  Service: Urology;  Laterality: Right;  . CYSTOSCOPY W/ URETERAL STENT PLACEMENT Right 10/09/2017   Procedure: CYSTOSCOPY WITH RETROGRADE PYELOGRAM/URETERAL STENT PLACEMENT;  Surgeon: Cleon Gustin, MD;  Location: [facility];  Service: Urology;  Laterality: Right;  30 MINS  . CYSTOSCOPY WITH RETROGRADE PYELOGRAM, URETEROSCOPY AND STENT PLACEMENT Right 09/21/2015   Procedure: CYSTOSCOPY WITH BIL RETROGRADE PYELOGRAM, URETEROSCOPY, RIGHT STENT PLACEMENT ;  Surgeon: Cleon Gustin, MD;  Location: [facility];  Service: Urology;   Laterality: Right;  . HEMICOLECTOMY Right 01/23/2015   Dr. Hassell Done  . IR FLUORO GUIDE PORT INSERTION RIGHT  03/24/2017  . IR US GUIDE VASC ACCESS RIGHT  03/24/2017  . LAPAROSCOPY N/A 01/23/2015   Procedure: DIAGNOSTIC LAPAROSCOPY, EXPLORATORY LAPAROTOMY  RIGHT HEMI-COLECTOMY FOR OBSTRUCTION OF RIGHT TERMINAL ILEUM;  Surgeon: [name], MD;  Location: [facility];  Service: General;  Laterality: N/A;  . PORTACATH PLACEMENT      I have reviewed the social history and family history with the patient and they are unchanged from previous note.  ALLERGIES:  is allergic to namenda [memantine hcl] and oxycodone.  MEDICATIONS:  Current Outpatient Medications  Medication Sig Dispense Refill  . amLODipine (NORVASC) 5 MG tablet Take 5 mg by mouth every morning.     Marland Kitchen aspirin 81 MG tablet Take 81 mg by mouth daily.     . Cholecalciferol (VITAMIN D3) 2000 UNITS TABS Take by mouth daily.    . Coenzyme Q10 (CO Q 10) 100 MG CAPS Take by mouth daily.    Marland Kitchen lidocaine-prilocaine (EMLA) cream Apply 1 application topically as needed. 30 g 5  . medium chain triglycerides (MCT OIL) oil Take 5 mLs by mouth 2 (two) times daily.     . Melatonin 5 MG TABS Take 5-15 mg by mouth at bedtime as needed.    . Meth-Hyo-M Bl-Na Phos-Ph Sal (URIBEL) 118 MG CAPS Take 1 capsule (118 mg total) by mouth 2 (two) times daily as needed. 30 capsule 3  . mirabegron ER (MYRBETRIQ) 50 MG TB24 tablet Take 1 tablet (50 mg total) by mouth daily. 30 tablet 11  . mirtazapine (REMERON) 15 MG tablet TAKE 1 TABLET BY MOUTH AT BEDTIME 30 tablet 3  . Multiple Vitamins-Minerals (WOMENS MULTIVITAMIN PO) Take 1 tablet by mouth daily. With iron    . polyethylene glycol (MIRALAX / GLYCOLAX) packet Take 17 g by mouth daily. (Patient taking differently: Take 17 g by mouth daily as needed. ) 14 each 0  . prochlorperazine (COMPAZINE) 5 MG tablet Take 1 tablet (5 mg total) by mouth every 6 (six) hours as needed for nausea or vomiting. 30 tablet 0  .  QUEtiapine (SEROQUEL) 25 MG tablet Take 1 tablet each night 30 tablet 6  . UNABLE TO FIND Med Name: Closys Mouth Rinse two-three times daily for ulcer.    . vitamin B-12 (CYANOCOBALAMIN) 1000 MCG tablet Take 1,000 mcg by mouth daily.     No current facility-administered medications for this visit.    Facility-Administered Medications Ordered in Other Visits  Medication Dose Route Frequency Provider Last Rate Last Dose  . heparin lock flush 100 unit/mL  500 Units Intravenous Once [name], MD      . sodium chloride flush (NS) 0.9 % injection 10 mL  10 mL Intracatheter PRN [name], MD   10 mL at 01/05/18 1505    PHYSICAL EXAMINATION: ECOG PERFORMANCE STATUS: 1 - Symptomatic but completely ambulatory  Vitals:   01/05/18 1119  BP: (!) 156/87  Pulse:  86  Resp: 17  Temp: 98 F (36.7 C)  SpO2: 100%   Filed Weights   01/05/18 1119  Weight: 116 lb 14.4 oz (53 kg)    GENERAL:alert, no distress and comfortable SKIN: no rashes or significant lesions EYES: sclera clear OROPHARYNX:no thrush or ulcers  LYMPH:  no palpable cervical or supraclavicular lymphadenopathy  LUNGS: clear to auscultation with normal breathing effort HEART: regular rate & rhythm,  no lower extremity edema ABDOMEN:abdomen soft, non-tender and normal bowel sounds Musculoskeletal:no cyanosis of digits and no clubbing  NEURO: alert & oriented x 3 with fluent speech, no focal motor/sensory deficits PAC without erythema   LABORATORY DATA:  I have reviewed the data as listed CBC Latest Ref Rng & Units 01/05/2018 12/22/2017 12/08/2017  WBC 4.0 - 10.5 K/uL 6.3 6.5 6.7  Hemoglobin 12.0 - 15.0 g/dL 9.8(L) 10.5(L) 10.3(L)  Hematocrit 36.0 - 46.0 % 30.6(L) 33.5(L) 31.1(L)  Platelets 150 - 400 K/uL 338 342 288     CMP Latest Ref Rng & Units 01/05/2018 12/22/2017 12/08/2017  Glucose 70 - 99 mg/dL 110(H) 88 86  BUN 8 - 23 mg/dL _0 Creatinine 0.44 - 1.00 mg/dL 1.49(H) 1.24(H) 1.02(H)  Sodium 135 - 145 mmol/L 146(H)  140 141  Potassium 3.5 - 5.1 mmol/L 4.1 3.8 3.4(L)  Chloride 98 - 111 mmol/L 112(H) 106 109  CO2 22 - 32 mmol/L _1 Calcium 8.9 - 10.3 mg/dL 8.7(L) 9.0 8.3(L)  Total Protein 6.5 - 8.1 g/dL 6.6 7.0 6.3(L)  Total Bilirubin 0.3 - 1.2 mg/dL 0.4 0.3 0.3  Alkaline Phos 38 - 126 U/L 73 87 77  AST 15 - 41 U/L _2 ALT 0 - 44 U/L _3 CEA  01/23/2015: 5.7 06/05/2015: 6.1 08/04/2015: 10.3 10/12/2015: 13.29 (in-house)  11/04/2015: 12.83 01/13/2016: 8.57 02/24/2016: 8.41 04/27/2016: 9.22 05/20/2016: 8.32 06/17/2016: 6.45 07/15/2016: 5.07 07/29/2016: 6.51 09/09/16: 7.47 10/07/16: 7.85 11/04/16: 7.75 12/02/16: 8.13 01/06/17: 10.36 02/03/17: 12.60 03/03/18: 9.96  03/31/17: 12.7 04/14/17: 11.93 05/12/17: 13.00 06/09/17: 14.97 07/07/17: 15.22 08/04/17: 17.26 09/01/17: 21.94 09/29/17: 25.89 10/27/17: 40.96 11/24/17: 44.80 12/22/17: 50.30   PATHOLOGY REPORT: Diagnosis 01/23/2015 Colon, segmental resection, with terminal ileum and cecum - INVASIVE ADENOCARCINOMA EXTENDING INTO PERICECAL CONNECTIVE TISSUE AND INVOLVING SMALL INTESTINE. - THREE BENIGN LYMPH NODES (0/3). - SEE ONCOLOGY TABLE BELOW.  Microscopic Comment COLON AND RECTUM (INCLUDING TRANS-ANAL RESECTION): Specimen: Terminal ileum and cecum. Procedure: Resection. Tumor site: Cecum adjacent to appendix. Specimen integrity: Intact. Macroscopic intactness of mesorectum: Not applicable. Macroscopic tumor perforation: No. Invasive tumor: Maximum size: 4.0 cm. Histologic type(s): Colorectal adenocarcinoma. Histologic grade and differentiation: G2: moderately differentiated/low grade. Type of polyp in which invasive carcinoma arose: No residual polyp. Microscopic extension of invasive tumor: Into pericecal connective tissue and terminal ileum. Lymph-Vascular invasion: Not identified. Peri-neural invasion: Not identified. Tumor deposit(s) (discontinuous extramural extension): No. Resection margins: Proximal margin: Free of  tumor. Distal margin: Free of tumor. Circumferential (radial) (posterior ascending, posterior descending; lateral and posterior mid-rectum; and entire lower 1/3 rectum): Free of tumor. Mesenteric margin (sigmoid and transverse): N/A. Distance closest margin (if all above margins negative): N/A. Treatment effect (neo-adjuvant therapy): No. Additional polyp(s): No. Non-neoplastic findings: N/A. Lymph nodes: number examined 3; number positive: 0. Pathologic Staging: pT4b, pN0, pMX. Ancillary studies: Mismatch repair protein by immunohistochemistry pending. Comments: There is a colorectal-type adenocarcinoma arising in the cecum in the area of the appendiceal orifice. The tumor extends into the pericecal connective tissue, and involves  the attached segment of terminal ileum. Immunohistochemistry shows the tumor is positive with CDX2 and cytokeratin 20, and negative with cytokeratin 7, estrogen receptor and WT-1 supporting a diagnosis of primary colorectal adenocarcinoma. ADDITIONAL INFORMATION: Mismatch Repair (MMR) Protein Immunohistochemistry (IHC) IHC Expression Result: MLH1: Preserved nuclear expression (greater 50% tumor expression) MSH2: Preserved nuclear expression (greater 50% tumor expression) MSH6: Preserved nuclear expression (greater 50% tumor expression) PMS2: Preserved nuclear expression (greater 50% tumor expression) * Internal control demonstrates intact nuclear expression Interpretation: NORMAL There is preserved expression of the major and minor MMR proteins. There is a very low probability that microsatellite instability (MSI) is present. However, certain clinically significant MMR protein mutations may result in preservation of nuclear expression. It is recommended that the preservation of protein expression be correlated with molecular based MSI testing.      RADIOGRAPHIC STUDIES: I have personally reviewed the radiological images as listed and agreed with the  findings in the report. Nm Pet Image Restag (ps) Skull Base To Thigh  Result Date: 01/04/2018 CLINICAL DATA:  Subsequent treatment strategy for colorectal carcinoma. EXAM: NUCLEAR MEDICINE PET SKULL BASE TO THIGH TECHNIQUE: 5.8 mCi F-18 FDG was injected intravenously. Full-ring PET imaging was performed from the skull base to thigh after the radiotracer. CT data was obtained and used for attenuation correction and anatomic localization. Due to patient difficulty maintaining position, exam was performed from the top of the shoulders to the mid thighs (excluding the head and neck) Fasting blood glucose: 84 mg/dl COMPARISON:  PET-CT 09/27/2017 FINDINGS: Mediastinal blood pool activity: SUV max NECK: Excluded as above Incidental CT findings: Excluded as above CHEST: 6 mm nodule superior segment of the LEFT lower lobe compares to 6 mm however does have metabolic activity with SUV max equal 1.6. In retrospect there has faint metabolic activity of this nodule. Two adjacent nodules in the LEFT upper lobe measures 6 mm and 4 mm also with low but measurable metabolic activity (SUV max 2.7 and 1.3 respectively) RIGHT lower lobe nodule with round shape measuring 5 mm (image 27/4) compared to 5 mm on prior also with mild metabolic activity. Incidental CT findings: none ABDOMEN/PELVIS: Increase in size and metabolic activity of 2 lesions in liver. Lesion the posterior RIGHT hepatic lobe measures 3.2 cm compared to 2.0 cm and with SUV max equal 15.2 increased from 10.4. Lesion in the lateral LEFT hepatic lobe anteriorly measures 2.4 cm compared with 1.6 cm with SUV max 14.2 increased from 12.0. No new hepatic lesions. A ventral mesenteric nodule with SUV max equal 6.2 is position beneath the LEFT rectus abdominus muscle measuring 19 mm (image 94/4). This is presumably is the same nodule on comparison exam which was beneath the RIGHT rectus abdominus muscle and shifted. Metabolic activity is increased with prior nodule with SUV  max equal 3.9. Nodular activity in the RIGHT lower quadrant adjacent to surgical anastomosis with ill-defined cell tissue measuring approximately 14 mm (image 107) increased in metabolic activity SUV max equal 7.2 increased from 5.6. There is hypermetabolic activity along the midline subcutaneous tissue superficial to the linea alba SUV max equal 4.3 similar to 4.6 on comparison exam. Small nodule is not changed in size measuring approximately 9 mm. Incidental CT findings: RIGHT ureteral stent in place. No hydronephrosis. Leiomyomatous uterus. SKELETON: No focal hypermetabolic activity to suggest skeletal metastasis. Incidental CT findings: none IMPRESSION: 1. Patient had some difficulty in maintaining position during scan and had difficulty following commands. There is motion degradation and patient was not imaged from the  neck up. 2. Bilateral pulmonary nodules with low but measurable metabolic activity consistent pulmonary metastasis. Metabolic activity is increased from comparison exam. Nodules are similar in size. 3. Interval increase in size and metabolic activity of two hepatic metastasis. 4. Increase in metabolic activity of ventral peritoneal nodule (which appears shifted into the LEFT abdomen from the RIGHT abdomen) and increased hypermetabolic activity of nodular thickening at the surgical anastomosis in RIGHT lower quadrant. Findings consistent peritoneal metastatic disease. 5. Stable subcutaneous nodule along the ventral midline with hypermetabolic activity. Electronically Signed   By: Suzy Bouchard M.D.   On: 01/04/2018 14:50     ASSESSMENT & PLAN: 77 y.o.African-American female, with past medical history of moderate dementia, independent with ADLs, hypertension, presents with small bowel obstruction and weight loss.  1. 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 Malak appears stable. She completed another cycle of irinotecan and avastin. She continues to tolerate well overall.   I reviewed her restaging PET scan which shows pulmonary nodules are similar in size but with low but measurable metabolic activity. There is interval increase in size and hypermetabolism in hepatic metastases as well as increased metabolic activity in the ventral peritoneal nodule. No evidence of new metastasis. I reviewed with Dr. Burr Medico, due to mild disease progression in the liver she recommends to increase to full dose irinotecan 180 mg/m2 and possibly add 5FU bolus with future cycles if patient can tolerate. I also gave the option of discontinuing chemotherapy in general and focusing on comfort care alone. However, she is clinically doing well and still able to tolerate treatment. The patient and daughter wish to continue treatment, and agree to increased dose.   I plan to hold avastin today given hematuria without clear UTI and increased creatinine. Her daughter keeps her well hydrated. PET shows right ureteral stent is in place and no hydronephrosis. I encouraged her to call Dr. [name] if hematuria worsens, she agrees.   Labs otherwise stable and adequate to proceed with irinotecan today. She will return in 2 weeks for f/u and next cycle.   Orders Placed This Encounter  Procedures  . Urine Culture    Standing Status:   Future    Number of Occurrences:   1    Standing Expiration Date:   01/05/2019  . Urinalysis, Complete w Microscopic    Standing Status:   Future    Number of Occurrences:   1    Standing Expiration Date:   01/06/2019   All questions were answered. The patient knows to call the clinic with any problems, questions or concerns. No barriers to learning was detected. I spent 20 minutes counseling the patient face to face. The total time spent in the  appointment was 25 minutes and more than 50% was on counseling and review of test results     Alla Feeling, NP 01/05/18

## 2018-01-05 NOTE — Patient Instructions (Signed)
[facility] Discharge Instructions for Patients Receiving Chemotherapy  Today you received the following chemotherapy agents: Irinotecan   To help prevent nausea and vomiting after your treatment, we encourage you to take your nausea medication as directed.    If you develop nausea and vomiting that is not controlled by your nausea medication, call the clinic.   BELOW ARE SYMPTOMS THAT SHOULD BE REPORTED IMMEDIATELY:  *FEVER GREATER THAN 100.5 F  *CHILLS WITH OR WITHOUT FEVER  NAUSEA AND VOMITING THAT IS NOT CONTROLLED WITH YOUR NAUSEA MEDICATION  *UNUSUAL SHORTNESS OF BREATH  *UNUSUAL BRUISING OR BLEEDING  TENDERNESS IN MOUTH AND THROAT WITH OR WITHOUT PRESENCE OF ULCERS  *URINARY PROBLEMS  *BOWEL PROBLEMS  UNUSUAL RASH Items with * indicate a potential emergency and should be followed up as soon as possible.  Feel free to call the clinic should you have any questions or concerns. The clinic phone number is [phone].  Please show the CHEMO ALERT CARD at check-in to the Emergency Department and triage nurse.   

## 2018-01-05 NOTE — Telephone Encounter (Addendum)
Pt daughter verbalized understanding   ----- Message from Alla Feeling, NP sent at 01/05/2018  3:26 PM EDT ----- Please let her know urine has blood in it, but no clear infection. Encourage her to hydrate and f/u with Dr. [name] urologist.  Marina Gravel, Regan Rakers

## 2018-01-05 NOTE — Telephone Encounter (Signed)
Per 11/1 los . schduled patient for upcoming appointment. Mailed a letter with calender enclosed

## 2018-01-06 LAB — URINE CULTURE

## 2018-01-15 ENCOUNTER — Other Ambulatory Visit: Payer: Self-pay | Admitting: Neurology

## 2018-01-15 MED FILL — QUETIAPINE 25 MG TABLET: 25 | 30 days supply | Qty: 30 | Fill #5

## 2018-01-15 MED FILL — LIDOCAINE-PRILOCAINE CREAM: 2.5-2.5 | 30 days supply | Qty: 30 | Fill #4

## 2018-01-15 MED FILL — MIRTAZAPINE 15 MG TABLET: 15 | 30 days supply | Qty: 30 | Fill #0

## 2018-01-15 MED FILL — MYRBETRIQ ER 50 MG TABLET: 50 | 30 days supply | Qty: 30 | Fill #1

## 2018-01-17 ENCOUNTER — Other Ambulatory Visit: Payer: Self-pay | Admitting: Hematology

## 2018-01-17 ENCOUNTER — Other Ambulatory Visit: Payer: Self-pay | Admitting: *Deleted

## 2018-01-17 ENCOUNTER — Telehealth: Payer: Self-pay | Admitting: Neurology

## 2018-01-17 MED ORDER — QUETIAPINE FUMARATE 25 MG PO TABS: ORAL_TABLET | ORAL | 0 refills | Status: DC

## 2018-01-17 MED ORDER — MIRTAZAPINE 15 MG PO TABS: 15.0000 mg | ORAL_TABLET | Freq: Every day | ORAL | 0 refills | Status: DC

## 2018-01-17 NOTE — Telephone Encounter (Signed)
Patient's daughter Lorriane Shire called and is needing to get a refill on her mother's medications. Unable to make out the names. Please Call. She uses [facility]. She needs a 30 Day supply. She is completely out of her medication. She is scheduled for Monday 01/22/18. Thanks

## 2018-01-17 NOTE — Telephone Encounter (Signed)
Rx sent for #30.  No refills.

## 2018-01-18 ENCOUNTER — Telehealth: Payer: Self-pay | Admitting: Hematology

## 2018-01-18 NOTE — Telephone Encounter (Signed)
LB out 11/15 - moved f/u to LT -. Spoke with relative.

## 2018-01-19 ENCOUNTER — Encounter: Payer: Self-pay | Admitting: Nurse Practitioner

## 2018-01-19 ENCOUNTER — Inpatient Hospital Stay: Payer: Medicare Other

## 2018-01-19 ENCOUNTER — Inpatient Hospital Stay (HOSPITAL_BASED_OUTPATIENT_CLINIC_OR_DEPARTMENT_OTHER): Payer: Medicare Other | Admitting: Nurse Practitioner

## 2018-01-19 VITALS — BP 155/87 | HR 85 | Temp 98.2°F | Resp 18 | Ht 66.0 in | Wt 116.5 lb

## 2018-01-19 DIAGNOSIS — C182 Malignant neoplasm of ascending colon: Secondary | ICD-10-CM

## 2018-01-19 DIAGNOSIS — C7802 Secondary malignant neoplasm of left lung: Secondary | ICD-10-CM | POA: Diagnosis not present

## 2018-01-19 DIAGNOSIS — C7801 Secondary malignant neoplasm of right lung: Secondary | ICD-10-CM

## 2018-01-19 DIAGNOSIS — N183 Chronic kidney disease, stage 3 (moderate): Secondary | ICD-10-CM

## 2018-01-19 DIAGNOSIS — Z5111 Encounter for antineoplastic chemotherapy: Secondary | ICD-10-CM | POA: Diagnosis not present

## 2018-01-19 DIAGNOSIS — C787 Secondary malignant neoplasm of liver and intrahepatic bile duct: Secondary | ICD-10-CM

## 2018-01-19 LAB — CBC WITH DIFFERENTIAL/PLATELET
Abs Immature Granulocytes: 0.02 10*3/uL (ref 0.00–0.07)
Basophils Absolute: 0.1 10*3/uL (ref 0.0–0.1)
Basophils Relative: 1 %
EOS PCT: 4 %
Eosinophils Absolute: 0.3 10*3/uL (ref 0.0–0.5)
HEMATOCRIT: 29.6 % — AB (ref 36.0–46.0)
HEMOGLOBIN: 9.5 g/dL — AB (ref 12.0–15.0)
IMMATURE GRANULOCYTES: 0 %
LYMPHS ABS: 1.1 10*3/uL (ref 0.7–4.0)
LYMPHS PCT: 15 %
MCH: 28.8 pg (ref 26.0–34.0)
MCHC: 32.1 g/dL (ref 30.0–36.0)
MCV: 89.7 fL (ref 80.0–100.0)
MONOS PCT: 6 %
Monocytes Absolute: 0.5 10*3/uL (ref 0.1–1.0)
Neutro Abs: 5.4 10*3/uL (ref 1.7–7.7)
Neutrophils Relative %: 74 %
Platelets: 315 10*3/uL (ref 150–400)
RBC: 3.3 MIL/uL — ABNORMAL LOW (ref 3.87–5.11)
RDW: 17.9 % — AB (ref 11.5–15.5)
WBC: 7.3 10*3/uL (ref 4.0–10.5)
nRBC: 0 % (ref 0.0–0.2)

## 2018-01-19 LAB — COMPREHENSIVE METABOLIC PANEL
ALK PHOS: 75 U/L (ref 38–126)
ALT: 8 U/L (ref 0–44)
ANION GAP: 9 (ref 5–15)
AST: 19 U/L (ref 15–41)
Albumin: 3.4 g/dL — ABNORMAL LOW (ref 3.5–5.0)
BUN: 23 mg/dL (ref 8–23)
CALCIUM: 8.6 mg/dL — AB (ref 8.9–10.3)
CO2: 25 mmol/L (ref 22–32)
Chloride: 109 mmol/L (ref 98–111)
Creatinine, Ser: 1.84 mg/dL — ABNORMAL HIGH (ref 0.44–1.00)
GFR calc non Af Amer: 26 mL/min — ABNORMAL LOW (ref >60)
GFR, EST AFRICAN AMERICAN: 30 mL/min — AB (ref >60)
Glucose, Bld: 91 mg/dL (ref 70–99)
Potassium: 4.1 mmol/L (ref 3.5–5.1)
Sodium: 143 mmol/L (ref 135–145)
TOTAL PROTEIN: 6.9 g/dL (ref 6.5–8.1)
Total Bilirubin: 0.3 mg/dL (ref 0.3–1.2)

## 2018-01-19 LAB — CEA (IN HOUSE-CHCC): CEA (CHCC-In House): 60.69 ng/mL — ABNORMAL HIGH (ref 0.00–5.00)

## 2018-01-19 LAB — TOTAL PROTEIN, URINE DIPSTICK: Protein, ur: 100 mg/dL — AB

## 2018-01-19 MED ORDER — SODIUM CHLORIDE 0.9% FLUSH
10.0000 mL | Freq: Once | INTRAVENOUS | Status: AC
  Administered 2018-01-19: 10 mL
  Filled 2018-01-19: qty 10

## 2018-01-19 MED ORDER — PALONOSETRON HCL INJECTION 0.25 MG/5ML
INTRAVENOUS | Status: AC
  Filled 2018-01-19: qty 5

## 2018-01-19 MED ORDER — IRINOTECAN HCL CHEMO INJECTION 100 MG/5ML
180.0000 mg/m2 | Freq: Once | INTRAVENOUS | Status: AC
  Administered 2018-01-19: 300 mg via INTRAVENOUS
  Filled 2018-01-19: qty 15

## 2018-01-19 MED ORDER — HEPARIN SOD (PORK) LOCK FLUSH 100 UNIT/ML IV SOLN
500.0000 [IU] | Freq: Once | INTRAVENOUS | Status: AC | PRN
  Administered 2018-01-19: 500 [IU]
  Filled 2018-01-19: qty 5

## 2018-01-19 MED ORDER — SODIUM CHLORIDE 0.9% FLUSH
10.0000 mL | INTRAVENOUS | Status: DC | PRN
  Administered 2018-01-19: 10 mL
  Filled 2018-01-19: qty 10

## 2018-01-19 MED ORDER — DEXAMETHASONE SODIUM PHOSPHATE 10 MG/ML IJ SOLN
INTRAMUSCULAR | Status: AC
  Filled 2018-01-19: qty 1

## 2018-01-19 MED ORDER — ATROPINE SULFATE 1 MG/ML IJ SOLN
0.5000 mg | Freq: Once | INTRAMUSCULAR | Status: AC | PRN
  Administered 2018-01-19: 0.5 mg via INTRAVENOUS

## 2018-01-19 MED ORDER — PALONOSETRON HCL INJECTION 0.25 MG/5ML
0.2500 mg | Freq: Once | INTRAVENOUS | Status: AC
  Administered 2018-01-19: 0.25 mg via INTRAVENOUS

## 2018-01-19 MED ORDER — DEXAMETHASONE SODIUM PHOSPHATE 10 MG/ML IJ SOLN
5.0000 mg | Freq: Once | INTRAMUSCULAR | Status: AC
  Administered 2018-01-19: 5 mg via INTRAVENOUS

## 2018-01-19 MED ORDER — SODIUM CHLORIDE 0.9 % IV SOLN
INTRAVENOUS | Status: AC
  Administered 2018-01-19: 12:00:00 via INTRAVENOUS
  Filled 2018-01-19 (×2): qty 250

## 2018-01-19 MED ORDER — ATROPINE SULFATE 1 MG/ML IJ SOLN
INTRAMUSCULAR | Status: AC
  Filled 2018-01-19: qty 1

## 2018-01-19 NOTE — Progress Notes (Signed)
Per Leander Rams NP ok to tx today with creatinine of 1.84 pt only receiving  Irinotecan today, no Avastin

## 2018-01-19 NOTE — Progress Notes (Signed)
  [facility] OFFICE PROGRESS NOTE   Diagnosis: Colon cancer  INTERVAL HISTORY:   Ms. Sigman returns as scheduled.  She is accompanied by her daughter.  She completed a cycle of Irinotecan full dose, 01/05/2018.  Avastin was held due to hematuria and an increase in the creatinine.  She had no nausea or vomiting.  No mouth sores.  She had diarrhea for about 1/2-day.  The diarrhea was relieved with Imodium and me and Metamucil.  She typically notes blood with the first urination of the day.  Urine then clears.  She denies shortness of breath and chest pain.  She has bilateral ankle edema.  Objective:  Vital signs in last 24 hours:  Blood pressure (!) 155/87, pulse 85, temperature 98.2 F (36.8 C), resp. rate 18, height _0  (1.676 m), weight 116 lb 8 oz (52.8 kg), SpO2 100 %.    HEENT: No thrush or ulcers. Resp: Lungs clear bilaterally. Cardio: Regular rate and rhythm. GI: Abdomen soft and nontender.  No hepatomegaly. Vascular: Trace bilateral ankle edema. Skin: No rash. Port-A-Cath without erythema.   Lab Results:  Lab Results  Component Value Date   WBC 7.3 01/19/2018   HGB 9.5 (L) 01/19/2018   HCT 29.6 (L) 01/19/2018   MCV 89.7 01/19/2018   PLT 315 01/19/2018   NEUTROABS 5.4 01/19/2018    Imaging:  No results found.  Medications: I have reviewed the patient's current medications.  Assessment/Plan: 1. 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 Hypertension, dementia  Disposition: Ms. Corena Pilgrim appears stable.  She completed a cycle of Irinotecan, full dose, 2 weeks ago.  Avastin was held that day due to hematuria and elevation of the creatinine.  She overall tolerated the increased dose of Irinotecan well with the exception of diarrhea lasting less than 1 day and controlled  with Imodium.  I reviewed today's labs with Dr. Burr Medico.  The creatinine is further elevated, 1.84.  Plan to proceed with Irinotecan as scheduled, hold Avastin.  She will receive additional IV fluids today and her daughter will work with her on increasing fluids at home.  She will return for lab, follow-up and the next cycle of treatment in 3 weeks.  She will contact the office in the interim with any problems.  Plan reviewed with Dr. Burr Medico.   Ned Card ANP/GNP-BC   01/19/2018  11:56 AM

## 2018-01-19 NOTE — Patient Instructions (Signed)
[facility] Discharge Instructions for Patients Receiving Chemotherapy  Today you received the following chemotherapy agents: Irinotecan   To help prevent nausea and vomiting after your treatment, we encourage you to take your nausea medication as directed.    If you develop nausea and vomiting that is not controlled by your nausea medication, call the clinic.   BELOW ARE SYMPTOMS THAT SHOULD BE REPORTED IMMEDIATELY:  *FEVER GREATER THAN 100.5 F  *CHILLS WITH OR WITHOUT FEVER  NAUSEA AND VOMITING THAT IS NOT CONTROLLED WITH YOUR NAUSEA MEDICATION  *UNUSUAL SHORTNESS OF BREATH  *UNUSUAL BRUISING OR BLEEDING  TENDERNESS IN MOUTH AND THROAT WITH OR WITHOUT PRESENCE OF ULCERS  *URINARY PROBLEMS  *BOWEL PROBLEMS  UNUSUAL RASH Items with * indicate a potential emergency and should be followed up as soon as possible.  Feel free to call the clinic should you have any questions or concerns. The clinic phone number is [phone].  Please show the CHEMO ALERT CARD at check-in to the Emergency Department and triage nurse.   

## 2018-01-22 ENCOUNTER — Ambulatory Visit: Payer: Medicare Other | Admitting: Neurology

## 2018-01-23 ENCOUNTER — Telehealth: Payer: Self-pay | Admitting: Nurse Practitioner

## 2018-01-23 NOTE — Telephone Encounter (Signed)
R/s appt per 11/15 los - pt daughter is aware of appts moved.

## 2018-01-30 MED FILL — AMLODIPINE BESYLATE 5 MG TA: 5 | 30 days supply | Qty: 30 | Fill #2

## 2018-02-08 ENCOUNTER — Ambulatory Visit: Payer: Medicare Other

## 2018-02-08 ENCOUNTER — Other Ambulatory Visit: Payer: Medicare Other

## 2018-02-08 ENCOUNTER — Ambulatory Visit: Payer: Medicare Other | Admitting: Hematology

## 2018-02-09 ENCOUNTER — Inpatient Hospital Stay: Payer: Medicare Other | Attending: Hematology

## 2018-02-09 ENCOUNTER — Inpatient Hospital Stay: Payer: Medicare Other

## 2018-02-09 ENCOUNTER — Other Ambulatory Visit: Payer: Self-pay | Admitting: *Deleted

## 2018-02-09 ENCOUNTER — Telehealth: Payer: Self-pay

## 2018-02-09 ENCOUNTER — Inpatient Hospital Stay (HOSPITAL_BASED_OUTPATIENT_CLINIC_OR_DEPARTMENT_OTHER): Payer: Medicare Other | Admitting: Hematology

## 2018-02-09 VITALS — BP 155/88 | HR 88 | Temp 97.8°F | Resp 17 | Ht 66.0 in | Wt 116.8 lb

## 2018-02-09 DIAGNOSIS — F0390 Unspecified dementia without behavioral disturbance: Secondary | ICD-10-CM | POA: Diagnosis not present

## 2018-02-09 DIAGNOSIS — Z9221 Personal history of antineoplastic chemotherapy: Secondary | ICD-10-CM

## 2018-02-09 DIAGNOSIS — C787 Secondary malignant neoplasm of liver and intrahepatic bile duct: Secondary | ICD-10-CM

## 2018-02-09 DIAGNOSIS — C182 Malignant neoplasm of ascending colon: Secondary | ICD-10-CM

## 2018-02-09 DIAGNOSIS — N179 Acute kidney failure, unspecified: Secondary | ICD-10-CM | POA: Diagnosis not present

## 2018-02-09 DIAGNOSIS — C786 Secondary malignant neoplasm of retroperitoneum and peritoneum: Secondary | ICD-10-CM

## 2018-02-09 DIAGNOSIS — D638 Anemia in other chronic diseases classified elsewhere: Secondary | ICD-10-CM

## 2018-02-09 DIAGNOSIS — I10 Essential (primary) hypertension: Secondary | ICD-10-CM

## 2018-02-09 LAB — CBC WITH DIFFERENTIAL/PLATELET
Abs Immature Granulocytes: 0.02 10*3/uL (ref 0.00–0.07)
Basophils Absolute: 0.1 10*3/uL (ref 0.0–0.1)
Basophils Relative: 1 %
Eosinophils Absolute: 0.3 10*3/uL (ref 0.0–0.5)
Eosinophils Relative: 5 %
HCT: 27.9 % — ABNORMAL LOW (ref 36.0–46.0)
Hemoglobin: 8.8 g/dL — ABNORMAL LOW (ref 12.0–15.0)
IMMATURE GRANULOCYTES: 0 %
Lymphocytes Relative: 20 %
Lymphs Abs: 1.3 10*3/uL (ref 0.7–4.0)
MCH: 28.5 pg (ref 26.0–34.0)
MCHC: 31.5 g/dL (ref 30.0–36.0)
MCV: 90.3 fL (ref 80.0–100.0)
Monocytes Absolute: 0.6 10*3/uL (ref 0.1–1.0)
Monocytes Relative: 9 %
NEUTROS PCT: 65 %
NRBC: 0 % (ref 0.0–0.2)
Neutro Abs: 4.2 10*3/uL (ref 1.7–7.7)
PLATELETS: 281 10*3/uL (ref 150–400)
RBC: 3.09 MIL/uL — ABNORMAL LOW (ref 3.87–5.11)
RDW: 18.3 % — ABNORMAL HIGH (ref 11.5–15.5)
WBC: 6.5 10*3/uL (ref 4.0–10.5)

## 2018-02-09 LAB — COMPREHENSIVE METABOLIC PANEL
ALT: <6 U/L (ref 0–44)
ANION GAP: 13 (ref 5–15)
AST: 17 U/L (ref 15–41)
Albumin: 3.5 g/dL (ref 3.5–5.0)
Alkaline Phosphatase: 72 U/L (ref 38–126)
BUN: 30 mg/dL — ABNORMAL HIGH (ref 8–23)
CO2: 22 mmol/L (ref 22–32)
Calcium: 8.7 mg/dL — ABNORMAL LOW (ref 8.9–10.3)
Chloride: 111 mmol/L (ref 98–111)
Creatinine, Ser: 2.82 mg/dL — ABNORMAL HIGH (ref 0.44–1.00)
GFR calc Af Amer: 18 mL/min — ABNORMAL LOW (ref >60)
GFR calc non Af Amer: 16 mL/min — ABNORMAL LOW (ref >60)
Glucose, Bld: 88 mg/dL (ref 70–99)
Potassium: 4 mmol/L (ref 3.5–5.1)
Sodium: 146 mmol/L — ABNORMAL HIGH (ref 135–145)
Total Bilirubin: 0.4 mg/dL (ref 0.3–1.2)
Total Protein: 7 g/dL (ref 6.5–8.1)

## 2018-02-09 LAB — URINALYSIS, COMPLETE (UACMP) WITH MICROSCOPIC
Bilirubin Urine: NEGATIVE
GLUCOSE, UA: NEGATIVE mg/dL
Ketones, ur: NEGATIVE mg/dL
Nitrite: NEGATIVE
PH: 7 (ref 5.0–8.0)
Protein, ur: 30 mg/dL — AB
RBC / HPF: >50 RBC/hpf — ABNORMAL HIGH (ref 0–5)
Specific Gravity, Urine: 1.005 (ref 1.005–1.030)

## 2018-02-09 LAB — FERRITIN: Ferritin: 139 ng/mL (ref 11–307)

## 2018-02-09 LAB — IRON AND TIBC
Iron: 57 ug/dL (ref 41–142)
SATURATION RATIOS: 24 % (ref 21–57)
TIBC: 233 ug/dL — ABNORMAL LOW (ref 236–444)
UIBC: 176 ug/dL (ref 120–384)

## 2018-02-09 LAB — TOTAL PROTEIN, URINE DIPSTICK: PROTEIN: 100 mg/dL — AB

## 2018-02-09 LAB — CEA (IN HOUSE-CHCC): CEA (CHCC-In House): 74.93 ng/mL — ABNORMAL HIGH (ref 0.00–5.00)

## 2018-02-09 MED ORDER — HEPARIN SOD (PORK) LOCK FLUSH 100 UNIT/ML IV SOLN
500.0000 [IU] | Freq: Once | INTRAVENOUS | Status: AC
  Administered 2018-02-09: 500 [IU]
  Filled 2018-02-09: qty 5

## 2018-02-09 MED ORDER — SODIUM CHLORIDE 0.9 % IV SOLN
Freq: Once | INTRAVENOUS | Status: AC
  Administered 2018-02-09: 13:00:00 via INTRAVENOUS
  Filled 2018-02-09: qty 250

## 2018-02-09 MED ORDER — PROCHLORPERAZINE MALEATE 5 MG PO TABS: 5.0000 mg | ORAL_TABLET | Freq: Four times a day (QID) | ORAL | 1 refills | Status: AC | PRN

## 2018-02-09 MED ORDER — SODIUM CHLORIDE 0.9 % IV SOLN
INTRAVENOUS | Status: AC
  Filled 2018-02-09: qty 250

## 2018-02-09 MED ORDER — SODIUM CHLORIDE 0.9% FLUSH
10.0000 mL | Freq: Once | INTRAVENOUS | Status: AC
  Administered 2018-02-09: 10 mL
  Filled 2018-02-09: qty 10

## 2018-02-09 MED FILL — PROCHLORPERAZINE 5 MG TAB: 5 | 5 days supply | Qty: 30 | Fill #0

## 2018-02-09 NOTE — Patient Instructions (Signed)
Implanted Port Home Guide An implanted port is a type of central line that is placed under the skin. Central lines are used to provide IV access when treatment or nutrition needs to be given through a person's veins. Implanted ports are used for long-term IV access. An implanted port may be placed because:  You need IV medicine that would be irritating to the small veins in your hands or arms.  You need long-term IV medicines, such as antibiotics.  You need IV nutrition for a long period.  You need frequent blood draws for lab tests.  You need dialysis.  Implanted ports are usually placed in the chest area, but they can also be placed in the upper arm, the abdomen, or the leg. An implanted port has two main parts:  Reservoir. The reservoir is round and will appear as a small, raised area under your skin. The reservoir is the part where a needle is inserted to give medicines or draw blood.  Catheter. The catheter is a thin, flexible tube that extends from the reservoir. The catheter is placed into a large vein. Medicine that is inserted into the reservoir goes into the catheter and then into the vein.  How will I care for my incision site? Do not get the incision site wet. Bathe or shower as directed by your health care provider. How is my port accessed? Special steps must be taken to access the port:  Before the port is accessed, a numbing cream can be placed on the skin. This helps numb the skin over the port site.  Your health care provider uses a sterile technique to access the port. ? Your health care provider must put on a mask and sterile gloves. ? The skin over your port is cleaned carefully with an antiseptic and allowed to dry. ? The port is gently pinched between sterile gloves, and a needle is inserted into the port.  Only "non-coring" port needles should be used to access the port. Once the port is accessed, a blood return should be checked. This helps ensure that the port  is in the vein and is not clogged.  If your port needs to remain accessed for a constant infusion, a clear (transparent) bandage will be placed over the needle site. The bandage and needle will need to be changed every week, or as directed by your health care provider.  Keep the bandage covering the needle clean and dry. Do not get it wet. Follow your health care provider's instructions on how to take a shower or bath while the port is accessed.  If your port does not need to stay accessed, no bandage is needed over the port.  What is flushing? Flushing helps keep the port from getting clogged. Follow your health care provider's instructions on how and when to flush the port. Ports are usually flushed with saline solution or a medicine called heparin. The need for flushing will depend on how the port is used.  If the port is used for intermittent medicines or blood draws, the port will need to be flushed: ? After medicines have been given. ? After blood has been drawn. ? As part of routine maintenance.  If a constant infusion is running, the port may not need to be flushed.  How long will my port stay implanted? The port can stay in for as long as your health care provider thinks it is needed. When it is time for the port to come out, surgery will be   done to remove it. The procedure is similar to the one performed when the port was put in. When should I seek immediate medical care? When you have an implanted port, you should seek immediate medical care if:  You notice a bad smell coming from the incision site.  You have swelling, redness, or drainage at the incision site.  You have more swelling or pain at the port site or the surrounding area.  You have a fever that is not controlled with medicine.  This information is not intended to replace advice given to you by your health care provider. Make sure you discuss any questions you have with your health care provider. Document  Released: 02/21/2005 Document Revised: 07/30/2015 Document Reviewed: 10/29/2012 Elsevier Interactive Patient Education  2017 [employer] Inc.  

## 2018-02-09 NOTE — Patient Instructions (Signed)
Dehydration, Adult Dehydration is when there is not enough fluid or water in your body. This happens when you lose more fluids than you take in. Dehydration can range from mild to very bad. It should be treated right away to keep it from getting very bad. Symptoms of mild dehydration may include:  Thirst.  Dry lips.  Slightly dry mouth.  Dry, warm skin.  Dizziness. Symptoms of moderate dehydration may include:  Very dry mouth.  Muscle cramps.  Dark pee (urine). Pee may be the color of tea.  Your body making less pee.  Your eyes making fewer tears.  Heartbeat that is uneven or faster than normal (palpitations).  Headache.  Light-headedness, especially when you stand up from sitting.  Fainting (syncope). Symptoms of very bad dehydration may include:  Changes in skin, such as: ? Cold and clammy skin. ? Blotchy (mottled) or pale skin. ? Skin that does not quickly return to normal after being lightly pinched and let go (poor skin turgor).  Changes in body fluids, such as: ? Feeling very thirsty. ? Your eyes making fewer tears. ? Not sweating when body temperature is high, such as in hot weather. ? Your body making very little pee.  Changes in vital signs, such as: ? Weak pulse. ? Pulse that is more than 100 beats a minute when you are sitting still. ? Fast breathing. ? Low blood pressure.  Other changes, such as: ? Sunken eyes. ? Cold hands and feet. ? Confusion. ? Lack of energy (lethargy). ? Trouble waking up from sleep. ? Short-term weight loss. ? Unconsciousness. Follow these instructions at home:  If told by your doctor, drink an ORS: ? Make an ORS by using instructions on the package. ? Start by drinking small amounts, about  cup (120 mL) every 5-10 minutes. ? Slowly drink more until you have had the amount that your doctor said to have.  Drink enough clear fluid to keep your pee clear or pale yellow. If you were told to drink an ORS, finish the ORS  first, then start slowly drinking clear fluids. Drink fluids such as: ? Water. Do not drink only water by itself. Doing that can make the salt (sodium) level in your body get too low (hyponatremia). ? Ice chips. ? Fruit juice that you have added water to (diluted). ? Low-calorie sports drinks.  Avoid: ? Alcohol. ? Drinks that have a lot of sugar. These include high-calorie sports drinks, fruit juice that does not have water added, and soda. ? Caffeine. ? Foods that are greasy or have a lot of fat or sugar.  Take over-the-counter and prescription medicines only as told by your doctor.  Do not take salt tablets. Doing that can make the salt level in your body get too high (hypernatremia).  Eat foods that have minerals (electrolytes). Examples include bananas, oranges, potatoes, tomatoes, and spinach.  Keep all follow-up visits as told by your doctor. This is important. Contact a doctor if:  You have belly (abdominal) pain that: ? Gets worse. ? Stays in one area (localizes).  You have a rash.  You have a stiff neck.  You get angry or annoyed more easily than normal (irritability).  You are more sleepy than normal.  You have a harder time waking up than normal.  You feel: ? Weak. ? Dizzy. ? Very thirsty.  You have peed (urinated) only a small amount of very dark pee during 6-8 hours. Get help right away if:  You have symptoms of   very bad dehydration.  You cannot drink fluids without throwing up (vomiting).  Your symptoms get worse with treatment.  You have a fever.  You have a very bad headache.  You are throwing up or having watery poop (diarrhea) and it: ? Gets worse. ? Does not go away.  You have blood or something green (bile) in your throw-up.  You have blood in your poop (stool). This may cause poop to look black and tarry.  You have not peed in 6-8 hours.  You pass out (faint).  Your heart rate when you are sitting still is more than 100 beats a  minute.  You have trouble breathing. This information is not intended to replace advice given to you by your health care provider. Make sure you discuss any questions you have with your health care provider. Document Released: 12/18/2008 Document Revised: 09/11/2015 Document Reviewed: 04/17/2015 Elsevier Interactive Patient Education  2018 [employer]  

## 2018-02-09 NOTE — Telephone Encounter (Signed)
Notified Dr. Burr Medico of patient's creatinine level, she will speak to [facility] in infusion.

## 2018-02-09 NOTE — Progress Notes (Signed)
[facility]   [phone] [phone]   Clinic Follow up Note   Patient Care Team: [name], MD as PCP - General (Family Medicine) [name], MD as Consulting Physician (Medical Oncology) [name], MD as Consulting Physician (General Surgery) [name], MD as Consulting Physician (Orthopedic Surgery) [name] [name], MD as Consulting Physician (Urology)  Date of Service:  02/09/2018  CHIEF COMPLAINT: F/u of metastatic colon cancer  SUMMARY OF ONCOLOGIC HISTORY: Oncology History   Cancer of right colon [facility])   Staging form: Colon and Rectum, AJCC 7th Edition     Pathologic stage from 01/23/2015: Stage IIC (T4b, N0, cM0) - Signed by [name], MD on 02/05/2015       Cancer of right colon ([place])   01/21/2015 Imaging     CT abdomen and pelvis showed high-grade small bowel obstruction,  right I [place] ureteral nephrosis,  multiple liver cysts.  CT chest was negative for metastatic lesion.    01/23/2015 Pathology Results     invasive adenocarcinoma extending into the pericecal connective tissue,  grade 2, LVI (-),  no perineural invasion, 3 lymph nodes were negative.    01/23/2015 Surgery      terminal ileum and cecum seegmental resection with anastomosis by Dr. Hassell Done    01/23/2015 Initial Diagnosis    Cancer of right colon ([place])    03/13/2015 - 07/06/2015 Chemotherapy    Xeloda 1500 mg bid X 14 days on-7 off, s/p 5 cycles, stopped early due to wrosening renal function    10/28/2015 Progression    PET scan showed multiple hypermetabolic peritoneal nodules, right ovarian cystic mass, and hypermetabolic liver lesion, highly suspicious for metastatic colon cancer. Small pulmonary nodules are indeterminate.     11/11/2015 - 05/20/2016 Chemotherapy    Xeloda '1500mg'$  bid, 2 weeks on and one week off, Avastin was added on from cycle 3. Xeloda held from 05/20/16 due to progression on CT from 05/19/16 and total bilirubin increased to 2.6.      01/08/2016 Imaging    Ct chest, Abdomen Pelvis Wo Contrast 01/08/2016  IMPRESSION: 1. 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 2. Hepatic and renal cysts. 3.  Aortoiliac atherosclerotic vascular disease. 4. Lumbar spondylosis and degenerative disc disease with multilevel Impingement.    05/19/2016 Imaging    CT CAP w Contrast IMPRESSION: 1. Numerous small solid pulmonary nodules, one of which is new, and several of which have mildly increased in size since 10/28/2015, worrisome for mild progression of pulmonary metastases. 2. Solitary right liver lobe metastasis is mildly increased in size since 01/08/2016. 3. Peritoneal metastases have mildly increased in size since 01/08/2016, largest in the right pelvic sidewall. 4. Well-positioned right nephroureteral stent without overt right hydronephrosis. 5. Additional findings include aortic atherosclerosis, 1 vessel coronary atherosclerosis and mild to moderate emphysema.    06/03/2016 -  Chemotherapy    Second-Line Irinotecan and Avastin every 2 weeks started 06/03/16, Avastin held as needed due to severe proteinuria. Irinotecan dose increased on 01/05/18 due to progression.     07/29/2016 Tumor Marker    CEA 6.51     08/09/2016 Imaging    PET  IMPRESSION: 1. Overall mild improvement compared to FDG PET scan of 10/28/2015. 2. Pulmonary nodules are increased in size from PET-CT scan and similar size to most recent CT scan. Largest lesion RIGHT lower lobe does have associated faint metabolic activity consistent with pulmonary metastasis. 3. Decrease in metabolic activity of  RIGHT hepatic lobe lesion. Potential second lesion LEFT hepatic lobe with slightly lower metabolic activity. 4. Decreased metabolic activity at the enteric colonic anastomosis in the RIGHT lower quadrant. 5. Interval decrease in metabolic activity of peritoneal nodular metastasis. 6. Nodular implant  implant along ventral abdominal wall may be within the musculature, but also improved.    09/09/2016 Tumor Marker    CEA: 7.47    10/07/2016 Tumor Marker    CEA: 7.85    11/04/2016 Tumor Marker    CEA: 7.75    11/25/2016 PET scan    PET 11/25/16 IMPRESSION: 1. Interval worsening in the interval. Numerous pulmonary nodules are again seen. While multiple nodules are stable, there are several nodules which are a little larger in the interval. They remain too small to completely characterize with PET-CT. The 2 hepatic metastases demonstrate increased FDG uptake on today's study compared to the previous study. Abdominal wall and peritoneal metastases are mildly worsened.    12/02/2016 Tumor Marker    CEA: 8.13    01/06/2017 Tumor Marker    CEA: 10.36    02/03/2017 Tumor Marker    CEA: 12.60    03/03/2017 Tumor Marker    CEA: 9.96    03/13/2017 PET scan    IMPRESSION: 1. Hepatic metastases are stable in size with stable to minimally decreased hypermetabolism. 2. Inferior left rectus abdominus metastasis shows mildly decreased hypermetabolism. 3. Omental nodule no longer shows abnormal hypermetabolism. 4. Scattered pulmonary nodules are stable in size and too small for PET resolution. 5.  Aortic atherosclerosis (ICD10-170.0). 6.  Emphysema (ICD10-J43.9).    07/03/2017 Imaging    PET, 07/03/2017 IMPRESSION: 1. General mild worsening, with increased activity in the hepatic metastatic lesions, mesenteric and omental nodularity, and with new/worsening metastatic nodularity in the right lower quadrant below the cecum. 2. There are over 20 small pulmonary nodules which are below sensitive PET-CT size thresholds and stable from the prior exam, but not currently hypermetabolic. 3. Other imaging findings of potential clinical significance: Aortic Atherosclerosis (ICD10-I70.0) and Emphysema (ICD10-J43.9). Coronary atherosclerosis. Uterine fibroid. Small amount of free pelvic fluid in the  cul-de-sac. Old pelvic deformities from prior fractures. Degenerative hip and glenohumeral arthropathy. Spondylosis.    09/27/2017 PET scan    IMPRESSION: 1. Relatively stable exam with no clear trend towards worsening disease or generalized improvement. 2. Scattered tiny bilateral pulmonary nodules without substantial interval change. 3. Index hypermetabolic lesions in the liver, omentum, right lower quadrant mesentery, and anterior abdominal wall show similar hypermetabolism to the prior study. 4.  Aortic Atherosclerois (ICD10-170.0) 5.  Emphysema. (ICD10-J43.9)     01/04/2018 PET scan    IMPRESSION: 1. Patient had some difficulty in maintaining position during scan and had difficulty following commands. There is motion degradation and patient was not imaged from the neck up. 2. Bilateral pulmonary nodules with low but measurable metabolic activity consistent pulmonary metastasis. Metabolic activity is increased from comparison exam. Nodules are similar in size. 3. Interval increase in size and metabolic activity of two hepatic metastasis. 4. Increase in metabolic activity of ventral peritoneal nodule (which appears shifted into the LEFT abdomen from the RIGHT abdomen) and increased hypermetabolic activity of nodular thickening at the surgical anastomosis in RIGHT lower quadrant. Findings consistent peritoneal metastatic disease. 5. Stable subcutaneous nodule along the ventral midline with hypermetabolic activity.        CURRENT THERAPY:  Second-Line Irinotecan and Avastin every 2 weeks started 06/03/16, Avastin held as needed due to severe proteinuria. Irinotecan dose increased on  01/06/18 due to mild progression.   INTERVAL HISTORY:  Beth Wilkins is here for a follow up and ongoing treatment. She presents to the clinic today accompanied by her daughter. She notes she is doing well. With full dose irinotecan she had some fatigue and diarrhea. Her daughter gave her metamucil and  fiber supplement which helped her diarrhea. She would give 3-4 supplements at most per episode. She has imodium to take as needed. Her daughter notes she denies any pain.  She did have bleeding 2 days ago from an ulcer on the labia. Her daughter notes bleeding this morning and her urine color from today's specimen was peach colored. She denies pain from this. It is now resolving.  She ambulates without cane and uses cane outside. Daughter notes her mother's mood and behavior is doing better with lower dose steroids. She had scratched her daughter in the past. Her daughter is interested in a stress reducing supplement which has worked for her mother as well. The attitude change happens a few days after infusion and then dissipates as the week goes and becomes fatigued. To help her sleep she has been using melatonin about 16m.      REVIEW OF SYSTEMS:   Constitutional: Denies fevers, chills or abnormal weight loss (+) fatigue  Eyes: Denies blurriness of vision Ears, nose, mouth, throat, and face: Denies mucositis or sore throat Respiratory: Denies cough, dyspnea or wheezes Cardiovascular: Denies palpitation, chest discomfort or lower extremity swelling Gastrointestinal:  Denies nausea, heartburn or change in bowel habits (+) mild-moderate diarrhea, manageable  Skin: Denies abnormal skin rashes (+) Small labial ulcer overall resolving  MSK: (+) Ambulates with cane Lymphatics: Denies new lymphadenopathy or easy bruising Neurological:Denies numbness, tingling or new weaknesses  Behavioral/Psych: Mood is stable, no new changes (+) mood and behavior improved  All other systems were reviewed with the patient and are negative.  MEDICAL HISTORY:  Past Medical History:  Diagnosis Date  . Anxiety   . Aortic atherosclerosis ([facility] 03/15/2017   noted on PET   . Arthritis   . Bilateral renal cysts   . Centrilobular emphysema ([facility] per 05-10-16 chest xray  . CKD (chronic kidney disease), stage III ([facility]     . Colon cancer ([facility] oncologist-  dr yTruitt Merle  dx 01-23-2015  right colon Invasive adenocarcinoma into pericecal connective tissue and involving small intestine , Stage IIc, Grade 2, LVI(-),  (pT4b N0 M0) /  s/p resection termial ileum and cecum and chemotherapy 01-06-217 to 07-06-2015/   08/ 2017  per PET  recurrent w/ liver mets and peritoneal carcinomatosis  . Complication of anesthesia    can be combative due to dementia  . Full dentures   . Headache    occ stress related  . History of chemotherapy 2019   last chemotherapy infusion 09/29/2017  . History of gastroesophageal reflux (GERD)    changes diet  . History of pelvic fracture 2015  . Hyperlipidemia   . Hypertension   . Liver metastasis ([facility]   . Moderate dementia without behavioral disturbance (H[facility])    moderate to severe dementia per neurologist note (dr yKrista Blue in epic  . N&V (nausea and vomiting)   . Pulmonary nodules   . Right ovarian cyst   . SBO (small bowel obstruction) ([facility] 01/2015  . Ureteral obstruction, right urologist-  dr mAlyson Ingles  malignant obstruction right ureter w/ colon cancer -- treated with ureteral stent placement  . UTI (urinary tract infection) 09/26/2016   5  day antibiotic treatment    SURGICAL HISTORY: Past Surgical History:  Procedure Laterality Date  . CATARACT EXTRACTION W/ INTRAOCULAR LENS  IMPLANT, BILATERAL  2005 approx  . CYSTOSCOPY W/ URETERAL STENT PLACEMENT Right 12/03/2015   Procedure: CYSTOSCOPY WITH RETROGRADE PYELOGRAM/URETERAL STENT PLACEMENT;  Surgeon: Cleon Gustin, MD;  [facility];  Service: Urology;  Laterality: Right;  . CYSTOSCOPY W/ URETERAL STENT PLACEMENT Right 02/15/2016   Procedure: CYSTOSCOPY WITH RETROGRADE PYELOGRAM/URETERAL STENT REPLACEMENT;  Surgeon: Cleon Gustin, MD;  Location: [facility];  Service: Urology;  Laterality: Right;  . CYSTOSCOPY W/ URETERAL STENT PLACEMENT Right 03/28/2016   Procedure: CYSTOSCOPY WITH  RETROGRADE PYELOGRAM/URETERAL STENT EXCHANGE;  Surgeon: Cleon Gustin, MD;  Location: [facility];  Service: Urology;  Laterality: Right;  . CYSTOSCOPY W/ URETERAL STENT PLACEMENT Right 07/18/2016   Procedure: CYSTOSCOPY WITH RETROGRADE PYELOGRAM/ STENT EXCHANGE;  Surgeon: Cleon Gustin, MD;  Location: [facility];  Service: Urology;  Laterality: Right;  . CYSTOSCOPY W/ URETERAL STENT PLACEMENT Right 10/10/2016   Procedure: CYSTOSCOPY WITH RETROGRADE PYELOGRAM/URETERAL STENT REPLACEMENT;  Surgeon: Cleon Gustin, MD;  Location: [facility];  Service: Urology;  Laterality: Right;  . CYSTOSCOPY W/ URETERAL STENT PLACEMENT Right 04/10/2017   Procedure: CYSTOSCOPY WITH RETROGRADE PYELOGRAM/URETERAL STENT EXCHANGE;  Surgeon: Cleon Gustin, MD;  Location: [facility];  Service: Urology;  Laterality: Right;  . CYSTOSCOPY W/ URETERAL STENT PLACEMENT Right 10/09/2017   Procedure: CYSTOSCOPY WITH RETROGRADE PYELOGRAM/URETERAL STENT PLACEMENT;  Surgeon: Cleon Gustin, MD;  Location: [facility];  Service: Urology;  Laterality: Right;  30 MINS  . CYSTOSCOPY WITH RETROGRADE PYELOGRAM, URETEROSCOPY AND STENT PLACEMENT Right 09/21/2015   Procedure: CYSTOSCOPY WITH BIL RETROGRADE PYELOGRAM, URETEROSCOPY, RIGHT STENT PLACEMENT ;  Surgeon: Cleon Gustin, MD;  [facility];  Service: Urology;  Laterality: Right;  . HEMICOLECTOMY Right 01/23/2015   Dr. Hassell Done  . IR FLUORO GUIDE PORT INSERTION RIGHT  03/24/2017  . IR US GUIDE VASC ACCESS RIGHT  03/24/2017  . LAPAROSCOPY N/A 01/23/2015   Procedure: DIAGNOSTIC LAPAROSCOPY, EXPLORATORY LAPAROTOMY  RIGHT HEMI-COLECTOMY FOR OBSTRUCTION OF RIGHT TERMINAL ILEUM;  Surgeon: [name], MD;  Location: [facility];  Service: General;  Laterality: N/A;  . PORTACATH PLACEMENT      I have reviewed the social history and family history with the patient and they  are unchanged from previous note.  ALLERGIES:  is allergic to namenda [memantine hcl] and oxycodone.  MEDICATIONS:  Current Outpatient Medications  Medication Sig Dispense Refill  . amLODipine (NORVASC) 5 MG tablet Take 5 mg by mouth every morning.     Marland Kitchen aspirin 81 MG tablet Take 81 mg by mouth daily.     . Cholecalciferol (VITAMIN D3) 2000 UNITS TABS Take by mouth daily.    . Coenzyme Q10 (CO Q 10) 100 MG CAPS Take by mouth daily.    Marland Kitchen lidocaine-prilocaine (EMLA) cream Apply 1 application topically as needed. 30 g 5  . medium chain triglycerides (MCT OIL) oil Take 5 mLs by mouth 2 (two) times daily.     . Melatonin 5 MG TABS Take 5-15 mg by mouth at bedtime as needed.    . Meth-Hyo-M Bl-Na Phos-Ph Sal (URIBEL) 118 MG CAPS Take 1 capsule (118 mg total) by mouth 2 (two) times daily as needed. 30 capsule 3  . mirabegron ER (MYRBETRIQ) 50 MG TB24 tablet Take 1 tablet (50 mg total) by mouth  daily. 30 tablet 11  . mirtazapine (REMERON) 15 MG tablet Take 1 tablet (15 mg total) by mouth at bedtime. 30 tablet 0  . Multiple Vitamins-Minerals (WOMENS MULTIVITAMIN PO) Take 1 tablet by mouth daily. With iron    . polyethylene glycol (MIRALAX / GLYCOLAX) packet Take 17 g by mouth daily. (Patient taking differently: Take 17 g by mouth daily as needed. ) 14 each 0  . prochlorperazine (COMPAZINE) 5 MG tablet Take 1 tablet (5 mg total) by mouth every 6 (six) hours as needed for nausea or vomiting. 30 tablet 1  . QUEtiapine (SEROQUEL) 25 MG tablet Take 1 tablet each night 30 tablet 0  . UNABLE TO FIND Med Name: Closys Mouth Rinse two-three times daily for ulcer.    . vitamin B-12 (CYANOCOBALAMIN) 1000 MCG tablet Take 1,000 mcg by mouth daily.     No current facility-administered medications for this visit.    Facility-Administered Medications Ordered in Other Visits  Medication Dose Route Frequency Provider Last Rate Last Dose  . heparin lock flush 100 unit/mL  500 Units Intravenous Once [name], MD          PHYSICAL EXAMINATION: ECOG PERFORMANCE STATUS: 2 - Symptomatic, <50% confined to bed  Vitals:   02/09/18 1126 02/09/18 1128  BP: (!) 164/94 (!) 155/88  Pulse: 88   Resp: 17   Temp: 97.8 F (36.6 C)   SpO2: 100%    Filed Weights   02/09/18 1126  Weight: 116 lb 12.8 oz (53 kg)    GENERAL:alert, no distress and comfortable SKIN: skin color, texture, turgor are normal, no rashes or significant lesions (+) Small labial ulcer with discharge, no bleeding, overall resolving  EYES: normal, Conjunctiva are pink and non-injected, sclera clear OROPHARYNX:no exudate, no erythema and lips, buccal mucosa, and tongue normal  NECK: supple, thyroid normal size, non-tender, without nodularity LYMPH:  no palpable lymphadenopathy in the cervical, axillary or inguinal LUNGS: clear to auscultation and percussion with normal breathing effort HEART: regular rate & rhythm and no murmurs and no lower extremity edema ABDOMEN:abdomen soft, non-tender and normal bowel sounds Musculoskeletal:no cyanosis of digits and no clubbing  NEURO: alert & oriented x 3 with fluent speech, no focal motor/sensory deficits (+) unsteady balance, ambulates with cane outside of home   LABORATORY DATA:  I have reviewed the data as listed CBC Latest Ref Rng & Units 02/09/2018 01/19/2018 01/05/2018  WBC 4.0 - 10.5 K/uL 6.5 7.3 6.3  Hemoglobin 12.0 - 15.0 g/dL 8.8(L) 9.5(L) 9.8(L)  Hematocrit 36.0 - 46.0 % 27.9(L) 29.6(L) 30.6(L)  Platelets 150 - 400 K/uL 281 315 338     CMP Latest Ref Rng & Units 02/09/2018 01/19/2018 01/05/2018  Glucose 70 - 99 mg/dL 88 91 110(H)  BUN 8 - 23 mg/dL 30(H) 23 17  Creatinine 0.44 - 1.00 mg/dL 2.82(H) 1.84(H) 1.49(H)  Sodium 135 - 145 mmol/L 146(H) 143 146(H)  Potassium 3.5 - 5.1 mmol/L 4.0 4.1 4.1  Chloride 98 - 111 mmol/L 111 109 112(H)  CO2 22 - 32 mmol/L _0 Calcium 8.9 - 10.3 mg/dL 8.7(L) 8.6(L) 8.7(L)  Total Protein 6.5 - 8.1 g/dL 7.0 6.9 6.6  Total Bilirubin 0.3 - 1.2 mg/dL  0.4 0.3 0.4  Alkaline Phos 38 - 126 U/L 72 75 73  AST 15 - 41 U/L _1 ALT 0 - 44 U/L <_2 RADIOGRAPHIC STUDIES: I have personally reviewed the radiological images as listed and agreed with  the findings in the report. No results found.   ASSESSMENT & PLAN:  SAMEEN LEAS is a 77 y.o. female with    1. 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 She is S/p surgical resection and adjuvant Xeloda.  -She developed liver and peritoneal metastasis 3 months after she completed adjuvant Xeloda.  -She is currently being treated with second-line Irinotecan and avastin since 05/2016.  She has been tolerating treatment very well overall and had a good response.  Avastin held as needed for significant proteinuria.  -If she has further disease progression or she experiences side effects from chemo, I will stop chemo treatment. I do not recommend third line chemo, due to the low benefit and her medical conditions especially dementia. -Recently increased irinotecan due to mild progression in liver starting 01/05/18. She has tolerated moderately well with fatigue and mild to moderate diarrhea which is manageable.  -I encouraged her and her daughter to use Imodium as needed.  -Labs reviewed, hg at 8.8, urine protein adequate.  Her creatinine has significantly increased to 2.8 today, hold chemotherapy, and give normal saline, repeat lab next week. If no improvement, will repeat scan    2. CKD stage III, right hydroureter  -A previous CT scan showed right hydroureter, which probably contributes to her renal insufficiency. -she had ureter stent placements in 12/2015, 03/2016, 04/2016, and 04/2017 -Continue to follow up with urology -Her renal function has gotten worse today, if no improvement with hydration, will consider repeating CT scan to rule out worsening hydroureter   3. HTN,  dementia, Mood Swings   -She will continue her medication and follow-up with her primary care physician. -Dementia better on Seroquel, continue  -Mood has improved with lower dose steroids. I discussed Seroquel also helps with her mood and behavior  -BP at 155/88 today (12/08/17).    4. Goal of care discussion  -Due to her advanced age and comorbidities, especially dementia, I have low threshold to switch her to palliative care alone if she does not tolerate chemo well or dementia gets worse, her daughter is on -board  -I recommend DNR/DNI, pt and her daughter agrees.  -She has a caregiver and is under advanced home care. They plan to continue with chemotherapy as long as she can tolerate it.   5. Insomnia  -Much better sleeping on Seroquel and melatonin, stable   6. Social support -Due to her dementia and chemotherapy, she requires 24/7 care at home.  -Her daughter is her primary caregiver and She has a care giver Beth Wilkins who comes in 2-3 days/week.    7. Small Labial Ulcer -She had 1 episode of bleeding ulcer at right labia, which has resolved now, exam was unremarkable today     Plan -Refill Compazine today  -Due to her worsening renal function, will hold chemo today, and give normal saline 1 L for hydration -Labs next week, if no improvement of renal function, will consider obtaining CT scan to rule out worsening hydroureter    No problem-specific Assessment & Plan notes found for this encounter.   No orders of the defined types were placed in this encounter.  All questions were answered. The patient knows to call the clinic with any problems, questions or concerns. No barriers to learning was detected. I spent 20 minutes counseling the patient face to face. The total time spent in the appointment was 25 minutes and more  than 50% was on counseling and review of test results     [name], MD 02/09/2018   I, Joslyn Devon, am acting as scribe for [name], MD.   I  have reviewed the above documentation for accuracy and completeness, and I agree with the above.

## 2018-02-09 NOTE — Progress Notes (Signed)
Per Dr. Burr Medico patient will not receive Avastin or Irinotecan today and will receive 1L fluids over 2 hours. Order entered for fluids.

## 2018-02-09 NOTE — Telephone Encounter (Signed)
Per 12/6 los patient declined avs and calender due to My Chart. Sent (MX) override for infusion and port

## 2018-02-10 ENCOUNTER — Encounter: Payer: Self-pay | Admitting: Hematology

## 2018-02-12 ENCOUNTER — Telehealth: Payer: Self-pay | Admitting: Hematology

## 2018-02-12 ENCOUNTER — Telehealth: Payer: Self-pay

## 2018-02-12 NOTE — Telephone Encounter (Signed)
Per 12/06 los. Appointments already scheduled.

## 2018-02-12 NOTE — Telephone Encounter (Signed)
-----   Message from [name], MD sent at 02/11/2018 12:54 PM EST ----- Please let her daughter know the lab results, UA showed blood in urine, no high suspicion for UTI, iron level is good. Plan to repeat her lab later this week, to see if her renal function improves after hydration. Thanks   [name]  02/11/2018

## 2018-02-12 NOTE — Telephone Encounter (Signed)
Left voice mail for patient's daughter Lorriane Shire, UA showed blood in urine, no suspicion for UTI, iron level is good, will repeat lab later this week to see if renal function improves after hydration.

## 2018-02-12 NOTE — Telephone Encounter (Signed)
Scheduled appt per 12/6 sch message - left message with appt date and time

## 2018-02-14 ENCOUNTER — Inpatient Hospital Stay: Payer: Medicare Other

## 2018-02-14 ENCOUNTER — Other Ambulatory Visit: Payer: Self-pay | Admitting: Hematology

## 2018-02-14 ENCOUNTER — Telehealth: Payer: Self-pay

## 2018-02-14 DIAGNOSIS — C182 Malignant neoplasm of ascending colon: Secondary | ICD-10-CM

## 2018-02-14 LAB — CBC WITH DIFFERENTIAL/PLATELET
Abs Immature Granulocytes: 0.05 10*3/uL (ref 0.00–0.07)
Basophils Absolute: 0.1 10*3/uL (ref 0.0–0.1)
Basophils Relative: 1 %
Eosinophils Absolute: 0.5 10*3/uL (ref 0.0–0.5)
Eosinophils Relative: 6 %
HCT: 27 % — ABNORMAL LOW (ref 36.0–46.0)
Hemoglobin: 8.6 g/dL — ABNORMAL LOW (ref 12.0–15.0)
Immature Granulocytes: 1 %
Lymphocytes Relative: 12 %
Lymphs Abs: 1 10*3/uL (ref 0.7–4.0)
MCH: 29.1 pg (ref 26.0–34.0)
MCHC: 31.9 g/dL (ref 30.0–36.0)
MCV: 91.2 fL (ref 80.0–100.0)
MONOS PCT: 8 %
Monocytes Absolute: 0.6 10*3/uL (ref 0.1–1.0)
Neutro Abs: 5.6 10*3/uL (ref 1.7–7.7)
Neutrophils Relative %: 72 %
Platelets: 266 10*3/uL (ref 150–400)
RBC: 2.96 MIL/uL — AB (ref 3.87–5.11)
RDW: 18 % — ABNORMAL HIGH (ref 11.5–15.5)
WBC: 7.8 10*3/uL (ref 4.0–10.5)
nRBC: 0 % (ref 0.0–0.2)

## 2018-02-14 LAB — COMPREHENSIVE METABOLIC PANEL
ALT: 6 U/L (ref 0–44)
AST: 20 U/L (ref 15–41)
Albumin: 3.2 g/dL — ABNORMAL LOW (ref 3.5–5.0)
Alkaline Phosphatase: 78 U/L (ref 38–126)
Anion gap: 9 (ref 5–15)
BUN: 40 mg/dL — ABNORMAL HIGH (ref 8–23)
CALCIUM: 8.7 mg/dL — AB (ref 8.9–10.3)
CO2: 25 mmol/L (ref 22–32)
CREATININE: 3.29 mg/dL — AB (ref 0.44–1.00)
Chloride: 109 mmol/L (ref 98–111)
GFR calc Af Amer: 15 mL/min — ABNORMAL LOW (ref >60)
GFR calc non Af Amer: 13 mL/min — ABNORMAL LOW (ref >60)
Glucose, Bld: 90 mg/dL (ref 70–99)
Potassium: 4.5 mmol/L (ref 3.5–5.1)
SODIUM: 143 mmol/L (ref 135–145)
Total Bilirubin: 0.4 mg/dL (ref 0.3–1.2)
Total Protein: 6.8 g/dL (ref 6.5–8.1)

## 2018-02-14 MED ORDER — HEPARIN SOD (PORK) LOCK FLUSH 100 UNIT/ML IV SOLN
250.0000 [IU] | Freq: Once | INTRAVENOUS | Status: AC
  Administered 2018-02-14: 250 [IU]
  Filled 2018-02-14: qty 5

## 2018-02-14 MED ORDER — SODIUM CHLORIDE 0.9% FLUSH
10.0000 mL | Freq: Once | INTRAVENOUS | Status: AC
  Administered 2018-02-14: 10 mL
  Filled 2018-02-14: qty 10

## 2018-02-14 NOTE — Telephone Encounter (Signed)
Spoke with patient's daughter Beth Wilkins informed her Creatinine has increased. Per Dr. Burr Medico it could be related to the stent or the cancer.  Will order a scan to see if can determine the source.  In the meantime she will keep a eye on her urine output. She currently drinks around 60 ounces of water daily.  She verbalized an understanding.  Informed her I will call her back when I have a date/time for the scan.

## 2018-02-15 MED FILL — MYRBETRIQ ER 50 MG TABLET: 50 | 30 days supply | Qty: 30 | Fill #2

## 2018-02-15 MED FILL — LIDOCAINE-PRILOCAINE CREAM: 2.5-2.5 | 30 days supply | Qty: 30 | Fill #5

## 2018-02-15 MED FILL — MIRTAZAPINE 15 MG TABLET: 15 | 30 days supply | Qty: 30 | Fill #1

## 2018-02-15 MED FILL — QUETIAPINE 25 MG TABLET: 25 | 30 days supply | Qty: 30 | Fill #6

## 2018-02-16 ENCOUNTER — Telehealth: Payer: Self-pay | Admitting: Hematology

## 2018-02-16 ENCOUNTER — Ambulatory Visit (HOSPITAL_COMMUNITY)
Admission: RE | Admit: 2018-02-16 | Discharge: 2018-02-16 | Disposition: A | Discharge status: Home / Self Care | Payer: Medicare Other | Source: Ambulatory Visit | Attending: Hematology | Admitting: Hematology

## 2018-02-16 DIAGNOSIS — C182 Malignant neoplasm of ascending colon: Secondary | ICD-10-CM | POA: Insufficient documentation

## 2018-02-16 NOTE — Telephone Encounter (Signed)
I called the patient's daughter Beth Wilkins, and discussed patient's CT scan results from earlier today.  She has bilateral hydronephrosis, status post right ureter stent placement by Dr. [name], she has stable left hydronephrosis.  I will send a message to Dr. [name], to see if he suggest left-sided stent placement.  CT scan also showed cancer progression in the liver, I would recommend stopping her current chemotherapy.  We will cancel her future infusion appointment.  I will keep her labs and follow-up with me next week, and will discuss hospice versus other treatment options.  Beth Wilkins voiced good understanding, and appreciated the call.  [name]  02/16/2018

## 2018-02-23 ENCOUNTER — Telehealth: Payer: Self-pay | Admitting: Hematology

## 2018-02-23 ENCOUNTER — Other Ambulatory Visit: Payer: Medicare Other

## 2018-02-23 ENCOUNTER — Inpatient Hospital Stay: Payer: Medicare Other

## 2018-02-23 ENCOUNTER — Encounter: Payer: Self-pay | Admitting: Hematology

## 2018-02-23 ENCOUNTER — Inpatient Hospital Stay (HOSPITAL_BASED_OUTPATIENT_CLINIC_OR_DEPARTMENT_OTHER): Payer: Medicare Other | Admitting: Hematology

## 2018-02-23 VITALS — BP 146/92 | HR 91 | Temp 98.0°F | Resp 16

## 2018-02-23 DIAGNOSIS — C786 Secondary malignant neoplasm of retroperitoneum and peritoneum: Secondary | ICD-10-CM

## 2018-02-23 DIAGNOSIS — F0390 Unspecified dementia without behavioral disturbance: Secondary | ICD-10-CM

## 2018-02-23 DIAGNOSIS — C182 Malignant neoplasm of ascending colon: Secondary | ICD-10-CM

## 2018-02-23 DIAGNOSIS — D638 Anemia in other chronic diseases classified elsewhere: Secondary | ICD-10-CM

## 2018-02-23 DIAGNOSIS — N179 Acute kidney failure, unspecified: Secondary | ICD-10-CM

## 2018-02-23 DIAGNOSIS — C787 Secondary malignant neoplasm of liver and intrahepatic bile duct: Secondary | ICD-10-CM

## 2018-02-23 DIAGNOSIS — I10 Essential (primary) hypertension: Secondary | ICD-10-CM

## 2018-02-23 LAB — COMPREHENSIVE METABOLIC PANEL
ALT: 11 U/L (ref 0–44)
AST: 22 U/L (ref 15–41)
Albumin: 3.6 g/dL (ref 3.5–5.0)
Alkaline Phosphatase: 86 U/L (ref 38–126)
Anion gap: 12 (ref 5–15)
BUN: 71 mg/dL — ABNORMAL HIGH (ref 8–23)
CHLORIDE: 109 mmol/L (ref 98–111)
CO2: 20 mmol/L — ABNORMAL LOW (ref 22–32)
Calcium: 8.8 mg/dL — ABNORMAL LOW (ref 8.9–10.3)
Creatinine, Ser: 5.94 mg/dL (ref 0.44–1.00)
GFR calc Af Amer: 7 mL/min — ABNORMAL LOW (ref >60)
GFR calc non Af Amer: 6 mL/min — ABNORMAL LOW (ref >60)
Glucose, Bld: 91 mg/dL (ref 70–99)
Potassium: 5.1 mmol/L (ref 3.5–5.1)
Sodium: 141 mmol/L (ref 135–145)
Total Bilirubin: 0.4 mg/dL (ref 0.3–1.2)
Total Protein: 7.4 g/dL (ref 6.5–8.1)

## 2018-02-23 LAB — CBC WITH DIFFERENTIAL/PLATELET
Abs Immature Granulocytes: 0.04 10*3/uL (ref 0.00–0.07)
Basophils Absolute: 0.1 10*3/uL (ref 0.0–0.1)
Basophils Relative: 1 %
Eosinophils Absolute: 0.6 10*3/uL — ABNORMAL HIGH (ref 0.0–0.5)
Eosinophils Relative: 7 %
HCT: 25.5 % — ABNORMAL LOW (ref 36.0–46.0)
Hemoglobin: 8.2 g/dL — ABNORMAL LOW (ref 12.0–15.0)
Immature Granulocytes: 0 %
LYMPHS PCT: 12 %
Lymphs Abs: 1.1 10*3/uL (ref 0.7–4.0)
MCH: 29.2 pg (ref 26.0–34.0)
MCHC: 32.2 g/dL (ref 30.0–36.0)
MCV: 90.7 fL (ref 80.0–100.0)
Monocytes Absolute: 0.7 10*3/uL (ref 0.1–1.0)
Monocytes Relative: 7 %
Neutro Abs: 6.8 10*3/uL (ref 1.7–7.7)
Neutrophils Relative %: 73 %
Platelets: 323 10*3/uL (ref 150–400)
RBC: 2.81 MIL/uL — ABNORMAL LOW (ref 3.87–5.11)
RDW: 17.9 % — ABNORMAL HIGH (ref 11.5–15.5)
WBC: 9.2 10*3/uL (ref 4.0–10.5)
nRBC: 0 % (ref 0.0–0.2)

## 2018-02-23 MED ORDER — HEPARIN SOD (PORK) LOCK FLUSH 100 UNIT/ML IV SOLN
500.0000 [IU] | Freq: Once | INTRAVENOUS | Status: AC
  Administered 2018-02-23: 500 [IU]
  Filled 2018-02-23: qty 5

## 2018-02-23 MED ORDER — SODIUM CHLORIDE 0.9% FLUSH
10.0000 mL | Freq: Once | INTRAVENOUS | Status: AC
  Administered 2018-02-23: 10 mL
  Filled 2018-02-23: qty 10

## 2018-02-23 MED ORDER — SODIUM CHLORIDE 0.9 % IV SOLN
INTRAVENOUS | Status: DC
  Administered 2018-02-23: 09:00:00 via INTRAVENOUS
  Filled 2018-02-23 (×2): qty 250

## 2018-02-23 NOTE — Telephone Encounter (Signed)
No los per 12/20.

## 2018-02-23 NOTE — Progress Notes (Signed)
Faxed patient's lab results from today to Dr. [name] at [facility] for his review.   Hospice Referral made faxed information to [place]

## 2018-02-23 NOTE — Progress Notes (Signed)
[name]   [phone] [phone]   Clinic Follow up Note   Patient Care Team: [name], MD as PCP - General (Family Medicine) [name], MD as Consulting Physician (Medical Oncology) [name], MD as Consulting Physician (General Surgery) [name], MD as Consulting Physician (Orthopedic Surgery) [name] [name], MD as Consulting Physician (Urology)  Date of Service:  02/23/2018  CHIEF COMPLAINT: F/u of metastatic colon cancer  SUMMARY OF ONCOLOGIC HISTORY: Oncology History   Cancer of right colon [facility])   Staging form: Colon and Rectum, AJCC 7th Edition     Pathologic stage from 01/23/2015: Stage IIC (T4b, N0, cM0) - Signed by [name], MD on 02/05/2015       Cancer of right colon ([place])   01/21/2015 Imaging     CT abdomen and pelvis showed high-grade small bowel obstruction,  right I [place] ureteral nephrosis,  multiple liver cysts.  CT chest was negative for metastatic lesion.    01/23/2015 Pathology Results     invasive adenocarcinoma extending into the pericecal connective tissue,  grade 2, LVI (-),  no perineural invasion, 3 lymph nodes were negative.    01/23/2015 Surgery      terminal ileum and cecum seegmental resection with anastomosis by Dr. Hassell Done    01/23/2015 Initial Diagnosis    Cancer of right colon ([place])    03/13/2015 - 07/06/2015 Chemotherapy    Xeloda 1500 mg bid X 14 days on-7 off, s/p 5 cycles, stopped early due to wrosening renal function    10/28/2015 Progression    PET scan showed multiple hypermetabolic peritoneal nodules, right ovarian cystic mass, and hypermetabolic liver lesion, highly suspicious for metastatic colon cancer. Small pulmonary nodules are indeterminate.     11/11/2015 - 05/20/2016 Chemotherapy    Xeloda '1500mg'$  bid, 2 weeks on and one week off, Avastin was added on from cycle 3. Xeloda held from 05/20/16 due to progression on CT from 05/19/16 and total bilirubin increased to 2.6.    01/08/2016 Imaging    Ct chest, Abdomen Pelvis Wo Contrast 01/08/2016  IMPRESSION: 1. 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 2. Hepatic and renal cysts. 3.  Aortoiliac atherosclerotic vascular disease. 4. Lumbar spondylosis and degenerative disc disease with multilevel Impingement.    05/19/2016 Imaging    CT CAP w Contrast IMPRESSION: 1. Numerous small solid pulmonary nodules, one of which is new, and several of which have mildly increased in size since 10/28/2015, worrisome for mild progression of pulmonary metastases. 2. Solitary right liver lobe metastasis is mildly increased in size since 01/08/2016. 3. Peritoneal metastases have mildly increased in size since 01/08/2016, largest in the right pelvic sidewall. 4. Well-positioned right nephroureteral stent without overt right hydronephrosis. 5. Additional findings include aortic atherosclerosis, 1 vessel coronary atherosclerosis and mild to moderate emphysema.    06/03/2016 - 02/23/2018 Chemotherapy    Second-Line Irinotecan and Avastin every 2 weeks started 06/03/16, Avastin held as needed due to severe proteinuria. Irinotecan dose increased on 01/05/18 due to progression. D/c chemo due to disease progression    07/29/2016 Tumor Marker    CEA 6.51     08/09/2016 Imaging    PET  IMPRESSION: 1. Overall mild improvement compared to FDG PET scan of 10/28/2015. 2. Pulmonary nodules are increased in size from PET-CT scan and similar size to most recent CT scan. Largest lesion RIGHT lower lobe does have associated faint metabolic activity consistent with pulmonary metastasis. 3. Decrease in  metabolic activity of RIGHT hepatic lobe lesion. Potential second lesion LEFT hepatic lobe with slightly lower metabolic activity. 4. Decreased metabolic activity at the enteric colonic anastomosis in the RIGHT lower quadrant. 5. Interval decrease in metabolic activity of  peritoneal nodular metastasis. 6. Nodular implant implant along ventral abdominal wall may be within the musculature, but also improved.    09/09/2016 Tumor Marker    CEA: 7.47    10/07/2016 Tumor Marker    CEA: 7.85    11/04/2016 Tumor Marker    CEA: 7.75    11/25/2016 PET scan    PET 11/25/16 IMPRESSION: 1. Interval worsening in the interval. Numerous pulmonary nodules are again seen. While multiple nodules are stable, there are several nodules which are a little larger in the interval. They remain too small to completely characterize with PET-CT. The 2 hepatic metastases demonstrate increased FDG uptake on today's study compared to the previous study. Abdominal wall and peritoneal metastases are mildly worsened.    12/02/2016 Tumor Marker    CEA: 8.13    01/06/2017 Tumor Marker    CEA: 10.36    02/03/2017 Tumor Marker    CEA: 12.60    03/03/2017 Tumor Marker    CEA: 9.96    03/13/2017 PET scan    IMPRESSION: 1. Hepatic metastases are stable in size with stable to minimally decreased hypermetabolism. 2. Inferior left rectus abdominus metastasis shows mildly decreased hypermetabolism. 3. Omental nodule no longer shows abnormal hypermetabolism. 4. Scattered pulmonary nodules are stable in size and too small for PET resolution. 5.  Aortic atherosclerosis (ICD10-170.0). 6.  Emphysema (ICD10-J43.9).    07/03/2017 Imaging    PET, 07/03/2017 IMPRESSION: 1. General mild worsening, with increased activity in the hepatic metastatic lesions, mesenteric and omental nodularity, and with new/worsening metastatic nodularity in the right lower quadrant below the cecum. 2. There are over 20 small pulmonary nodules which are below sensitive PET-CT size thresholds and stable from the prior exam, but not currently hypermetabolic. 3. Other imaging findings of potential clinical significance: Aortic Atherosclerosis (ICD10-I70.0) and Emphysema (ICD10-J43.9). Coronary atherosclerosis. Uterine  fibroid. Small amount of free pelvic fluid in the cul-de-sac. Old pelvic deformities from prior fractures. Degenerative hip and glenohumeral arthropathy. Spondylosis.    09/27/2017 PET scan    IMPRESSION: 1. Relatively stable exam with no clear trend towards worsening disease or generalized improvement. 2. Scattered tiny bilateral pulmonary nodules without substantial interval change. 3. Index hypermetabolic lesions in the liver, omentum, right lower quadrant mesentery, and anterior abdominal wall show similar hypermetabolism to the prior study. 4.  Aortic Atherosclerois (ICD10-170.0) 5.  Emphysema. (ICD10-J43.9)     01/04/2018 PET scan    IMPRESSION: 1. Patient had some difficulty in maintaining position during scan and had difficulty following commands. There is motion degradation and patient was not imaged from the neck up. 2. Bilateral pulmonary nodules with low but measurable metabolic activity consistent pulmonary metastasis. Metabolic activity is increased from comparison exam. Nodules are similar in size. 3. Interval increase in size and metabolic activity of two hepatic metastasis. 4. Increase in metabolic activity of ventral peritoneal nodule (which appears shifted into the LEFT abdomen from the RIGHT abdomen) and increased hypermetabolic activity of nodular thickening at the surgical anastomosis in RIGHT lower quadrant. Findings consistent peritoneal metastatic disease. 5. Stable subcutaneous nodule along the ventral midline with hypermetabolic activity.      02/16/2018 Imaging    CT AP IMPRESSION: 1. Enlarging hepatic metastatic lesions.  No new lesions. 2. Stable lower lobe pulmonary metastasis.  3. Stable renal and hepatic cyst. 4. Stable right-sided double-J ureteral stent and stable left-sided hydroureteronephrosis. 5. Enlarged fibroid uterus. 6. Advanced atherosclerotic calcifications involving the aorta and iliac arteries.       CURRENT THERAPY:  Second-Line  Irinotecan and Avastin every 2 weeks started 06/03/16, Avastin held as needed due to severe proteinuria. Irinotecan dose increased on 01/06/18 due to mild progression. Stopped after 01/19/2018 dye to worsening renal function and cancer progression.   INTERVAL HISTORY:  Beth Wilkins is here for a follow up and ongoing treatment. She presents with her daughter today. She denies any pain. Her daughter notes she tries to get her to ingest fluids about 40 ounces a day. Her daughter tries to keep her as active as possible.     REVIEW OF SYSTEMS:   Constitutional: Denies fevers, chills or abnormal weight loss Eyes: Denies blurriness of vision Ears, nose, mouth, throat, and face: Denies mucositis or sore throat Respiratory: Denies cough, dyspnea or wheezes Cardiovascular: Denies palpitation, chest discomfort or lower extremity swelling Gastrointestinal:  Denies nausea, heartburn or change in bowel habits Skin: Denies abnormal skin rashes Lymphatics: Denies new lymphadenopathy or easy bruising Neurological:Denies numbness, tingling or new weaknesses Behavioral/Psych: Mood is stable, no new changes  All other systems were reviewed with the patient and are negative.  MEDICAL HISTORY:  Past Medical History:  Diagnosis Date  . Anxiety   . Aortic atherosclerosis ([place]) 03/15/2017   noted on PET   . Arthritis   . Bilateral renal cysts   . Centrilobular emphysema ([place]) per 05-10-16 chest xray  . CKD (chronic kidney disease), stage III ([place])   . Colon cancer [facility]) oncologist-  dr [name]   dx 01-23-2015  right colon Invasive adenocarcinoma into pericecal connective tissue and involving small intestine , Stage IIc, Grade 2, LVI(-),  (pT4b N0 M0) /  s/p resection termial ileum and cecum and chemotherapy 01-06-217 to 07-06-2015/   08/ 2017  per PET  recurrent w/ liver mets and peritoneal carcinomatosis  . Complication of anesthesia    can be combative due to dementia  . Full dentures   . Headache     occ stress related  . History of chemotherapy 2019   last chemotherapy infusion 09/29/2017  . History of gastroesophageal reflux (GERD)    changes diet  . History of pelvic fracture 2015  . Hyperlipidemia   . Hypertension   . Liver metastasis ([place])   . Moderate dementia without behavioral disturbance (HCC)    moderate to severe dementia per neurologist note (dr Krista Blue) in epic  . N&V (nausea and vomiting)   . Pulmonary nodules   . Right ovarian cyst   . SBO (small bowel obstruction) ([place]) 01/2015  . 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 UTI (urinary tract infection) 09/26/2016   5 day antibiotic treatment    SURGICAL HISTORY: Past Surgical History:  Procedure Laterality Date  . CATARACT EXTRACTION W/ INTRAOCULAR LENS  IMPLANT, BILATERAL  2005 approx  . CYSTOSCOPY W/ URETERAL STENT PLACEMENT Right 12/03/2015   Procedure: CYSTOSCOPY WITH RETROGRADE PYELOGRAM/URETERAL STENT PLACEMENT;  Surgeon: Cleon Gustin, MD;  [facility];  Service: Urology;  Laterality: Right;  . CYSTOSCOPY W/ URETERAL STENT PLACEMENT Right 02/15/2016   Procedure: CYSTOSCOPY WITH RETROGRADE PYELOGRAM/URETERAL STENT REPLACEMENT;  Surgeon: Cleon Gustin, MD;  Location[facility];  Service: Urology;  Laterality:  Right;  Marland Kitchen CYSTOSCOPY W/ URETERAL STENT PLACEMENT Right 03/28/2016   Procedure: CYSTOSCOPY WITH RETROGRADE PYELOGRAM/URETERAL STENT EXCHANGE;  Surgeon: Cleon Gustin, MD;  Location: [facility];  Service: Urology;  Laterality: Right;  . CYSTOSCOPY W/ URETERAL STENT PLACEMENT Right 07/18/2016   Procedure: CYSTOSCOPY WITH RETROGRADE PYELOGRAM/ STENT EXCHANGE;  Surgeon: Cleon Gustin, MD;  Location: [facility];  Service: Urology;  Laterality: Right;  . CYSTOSCOPY W/ URETERAL STENT PLACEMENT Right 10/10/2016   Procedure:  CYSTOSCOPY WITH RETROGRADE PYELOGRAM/URETERAL STENT REPLACEMENT;  Surgeon: Cleon Gustin, MD;  Location: [facility];  Service: Urology;  Laterality: Right;  . CYSTOSCOPY W/ URETERAL STENT PLACEMENT Right 04/10/2017   Procedure: CYSTOSCOPY WITH RETROGRADE PYELOGRAM/URETERAL STENT EXCHANGE;  Surgeon: Cleon Gustin, MD;  Location: [facility];  Service: Urology;  Laterality: Right;  . CYSTOSCOPY W/ URETERAL STENT PLACEMENT Right 10/09/2017   Procedure: CYSTOSCOPY WITH RETROGRADE PYELOGRAM/URETERAL STENT PLACEMENT;  Surgeon: Cleon Gustin, MD;  Location: [facility];  Service: Urology;  Laterality: Right;  30 MINS  . CYSTOSCOPY WITH RETROGRADE PYELOGRAM, URETEROSCOPY AND STENT PLACEMENT Right 09/21/2015   Procedure: CYSTOSCOPY WITH BIL RETROGRADE PYELOGRAM, URETEROSCOPY, RIGHT STENT PLACEMENT ;  Surgeon: Cleon Gustin, MD;  Location[facility]  Service: Urology;  Laterality: Right;  . HEMICOLECTOMY Right 01/23/2015   Dr. Hassell Done  . IR FLUORO GUIDE PORT INSERTION RIGHT  03/24/2017  . IR US GUIDE VASC ACCESS RIGHT  03/24/2017  . LAPAROSCOPY N/A 01/23/2015   Procedure: DIAGNOSTIC LAPAROSCOPY, EXPLORATORY LAPAROTOMY  RIGHT HEMI-COLECTOMY FOR OBSTRUCTION OF RIGHT TERMINAL ILEUM;  Surgeon: [name], MD;  Location: [facility];  Service: General;  Laterality: N/A;  . PORTACATH PLACEMENT      I have reviewed the social history and family history with the patient and they are unchanged from previous note.  ALLERGIES:  is allergic to namenda [memantine hcl] and oxycodone.  MEDICATIONS:  Current Outpatient Medications  Medication Sig Dispense Refill  . amLODipine (NORVASC) 5 MG tablet Take 5 mg by mouth every morning.     Marland Kitchen aspirin 81 MG tablet Take 81 mg by mouth daily.     . Cholecalciferol (VITAMIN D3) 2000 UNITS TABS Take by mouth daily.    . Coenzyme Q10 (CO Q 10) 100 MG CAPS Take by mouth daily.    Marland Kitchen  lidocaine-prilocaine (EMLA) cream Apply 1 application topically as needed. 30 g 5  . medium chain triglycerides (MCT OIL) oil Take 5 mLs by mouth 2 (two) times daily.     . Melatonin 5 MG TABS Take 5-15 mg by mouth at bedtime as needed.    . Meth-Hyo-M Bl-Na Phos-Ph Sal (URIBEL) 118 MG CAPS Take 1 capsule (118 mg total) by mouth 2 (two) times daily as needed. 30 capsule 3  . mirabegron ER (MYRBETRIQ) 50 MG TB24 tablet Take 1 tablet (50 mg total) by mouth daily. 30 tablet 11  . mirtazapine (REMERON) 15 MG tablet Take 1 tablet (15 mg total) by mouth at bedtime. 30 tablet 0  . Multiple Vitamins-Minerals (WOMENS MULTIVITAMIN PO) Take 1 tablet by mouth daily. With iron    . polyethylene glycol (MIRALAX / GLYCOLAX) packet Take 17 g by mouth daily. (Patient taking differently: Take 17 g by mouth daily as needed. ) 14 each 0  . prochlorperazine (COMPAZINE) 5 MG tablet Take 1 tablet (5 mg total) by mouth every 6 (six) hours as needed for nausea or vomiting. 30 tablet 1  .  QUEtiapine (SEROQUEL) 25 MG tablet Take 1 tablet each night 30 tablet 0  . UNABLE TO FIND Med Name: Closys Mouth Rinse two-three times daily for ulcer.    . vitamin B-12 (CYANOCOBALAMIN) 1000 MCG tablet Take 1,000 mcg by mouth daily.     No current facility-administered medications for this visit.    Facility-Administered Medications Ordered in Other Visits  Medication Dose Route Frequency Provider Last Rate Last Dose  . heparin lock flush 100 unit/mL  500 Units Intravenous Once [name], MD        PHYSICAL EXAMINATION: ECOG PERFORMANCE STATUS: 2 - Symptomatic, <50% confined to bed  There were no vitals filed for this visit. There were no vitals filed for this visit.  GENERAL:alert, no distress and comfortable SKIN: skin color, texture, turgor are normal, no rashes or significant lesions EYES: normal, Conjunctiva are pink and non-injected, sclera clear OROPHARYNX:no exudate, no erythema and lips, buccal mucosa, and tongue  normal  NECK: supple, thyroid normal size, non-tender, without nodularity LYMPH:  no palpable lymphadenopathy in the cervical, axillary or inguinal LUNGS: clear to auscultation and percussion with normal breathing effort HEART: regular rate & rhythm and no murmurs and no lower extremity edema ABDOMEN:abdomen soft, non-tender and normal bowel sounds Musculoskeletal:no cyanosis of digits and no clubbing  NEURO: alert & oriented x 3 with fluent speech, no focal motor/sensory deficits  LABORATORY DATA:  I have reviewed the data as listed CBC Latest Ref Rng & Units 02/23/2018 02/14/2018 02/09/2018  WBC 4.0 - 10.5 K/uL 9.2 7.8 6.5  Hemoglobin 12.0 - 15.0 g/dL 8.2(L) 8.6(L) 8.8(L)  Hematocrit 36.0 - 46.0 % 25.5(L) 27.0(L) 27.9(L)  Platelets 150 - 400 K/uL 323 266 281     CMP Latest Ref Rng & Units 02/23/2018 02/14/2018 02/09/2018  Glucose 70 - 99 mg/dL 91 90 88  BUN 8 - 23 mg/dL 71(H) 40(H) 30(H)  Creatinine 0.44 - 1.00 mg/dL 5.94(HH) 3.29(HH) 2.82(H)  Sodium 135 - 145 mmol/L 141 143 146(H)  Potassium 3.5 - 5.1 mmol/L 5.1 4.5 4.0  Chloride 98 - 111 mmol/L 109 109 111  CO2 22 - 32 mmol/L 20(L) 25 22  Calcium 8.9 - 10.3 mg/dL 8.8(L) 8.7(L) 8.7(L)  Total Protein 6.5 - 8.1 g/dL 7.4 6.8 7.0  Total Bilirubin 0.3 - 1.2 mg/dL 0.4 0.4 0.4  Alkaline Phos 38 - 126 U/L 86 78 72  AST 15 - 41 U/L '22 20 17  '$ ALT 0 - 44 U/L 11 6 <6      RADIOGRAPHIC STUDIES: I have personally reviewed the radiological images as listed and agreed with the findings in the report. No results found.   ASSESSMENT & PLAN:  STEFANI BAIK is a 77 y.o. female with a history of    1. 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 She is S/p surgical resection and adjuvant Xeloda.  -She developed liver and peritoneal metastasis 3 months after she completed adjuvant Xeloda.  -She has been on second-line  Irinotecan and Avastin since 05/2016.  She has been tolerating treatment very well overall and had a good response.  Avastin held as needed for significant proteinuria.  -Due to her worsening renal function, chemotherapy was held after treatment on January 19, 2018 -I reviewed her CT AP from 02/16/18 which showed significant growth in her liver met. She has no new lesions, stable lung met, renal and liver cyst and stable hydroureteronephrosis. Overall shows  disease progression.  Due to her cancer progression and worsening renal function, I will stop her chemo.  -I do not recommend third line chemo, due to the low benefit and her medical conditions especially dementia. -I discussed the option of hospice home care, I explained the service to her daughter, her daughter is in agreeance. I will send referral a. -Labs reviewed, Hg decreased to 8.2, likely secondary to chemo. I encourged her to start prenatal viatmin.  P showed a worsening renal function, creatinine 5.9.  She is to making good urine.  -I will give IV Fluids today    2. Acute on chronic renal insufficiency, bilateral hydroureter  -A previous CT scan showed right hydroureter, which probably contributes to her renal insufficiency. -She had ureter stent placements in 12/2015, 03/2016, 04/2016, and 04/2017 -Recent CT AP shows stable hydroureteronephrosis, her renal function has significantly gotten worse.  Sent a message to her urologist Dr. [name], to see if she needs any intervention  3. HTN, dementia -She will continue her medication and follow-up with her primary care physician. -Dementia better on Seroquel, continue  -BP is controlled  4.Goal of care discussion  -I recommendDNR/DNI, pt and her daughter agreed.  -She has a caregiver and is under advanced home care. They plan to continue with chemotherapy as long as she can tolerate it.  -Due to her advanced age and comorbidities, especially dementia, and her latest disease  progression, I  discontinued of chemo, her daughter plans to start Hospice care.   5. Insomnia  -Much better sleeping on Seroquel and melatonin, stable.   6. Social support -Due to her dementia and chemotherapy, she requires 24/7 care at home.  -Her daughter is her primary caregiver and She has a care giver Debora who comes in 2-3 days/week. Will continue     Plan -stop chemo -IV Fluids today  -Hospice referral, I will only see her as needed in future    No problem-specific Assessment & Plan notes found for this encounter.   Orders Placed This Encounter  Procedures  . Ambulatory referral to Hospice    Referral Priority:   Routine    Referral Type:   Consultation    Referral Reason:   Specialty Services Required    Requested Specialty:   Hospice Services    Number of Visits Requested:   1   All questions were answered. The patient knows to call the clinic with any problems, questions or concerns. No barriers to learning was detected. I spent 20 minutes counseling the patient face to face. The total time spent in the appointment was 25 minutes and more than 50% was on counseling and review of test results     [name], MD 02/23/2018   I, Joslyn Devon, am acting as scribe for [name], MD.   I have reviewed the above documentation for accuracy and completeness, and I agree with the above.

## 2018-02-23 NOTE — Patient Instructions (Signed)

## 2018-02-26 ENCOUNTER — Telehealth: Payer: Self-pay

## 2018-02-26 NOTE — Telephone Encounter (Signed)
Faxed signed orders to [facility] [facility], sent to HIM for scanning to chart.

## 2018-03-01 MED FILL — AMLODIPINE BESYLATE 5 MG TA: 5 | 30 days supply | Qty: 30 | Fill #3

## 2018-03-02 ENCOUNTER — Encounter: Payer: Self-pay | Admitting: Neurology

## 2018-03-02 ENCOUNTER — Ambulatory Visit (INDEPENDENT_AMBULATORY_CARE_PROVIDER_SITE_OTHER): Admitting: Neurology

## 2018-03-02 ENCOUNTER — Other Ambulatory Visit: Payer: Self-pay

## 2018-03-02 VITALS — BP 146/78 | HR 105 | Ht 66.0 in | Wt 116.0 lb

## 2018-03-02 DIAGNOSIS — F03C0 Unspecified dementia, severe, without behavioral disturbance, psychotic disturbance, mood disturbance, and anxiety: Secondary | ICD-10-CM

## 2018-03-02 DIAGNOSIS — F0390 Unspecified dementia without behavioral disturbance: Secondary | ICD-10-CM | POA: Diagnosis not present

## 2018-03-02 MED ORDER — QUETIAPINE FUMARATE 25 MG PO TABS: ORAL_TABLET | ORAL | 3 refills | Status: AC

## 2018-03-02 MED ORDER — MIRTAZAPINE 15 MG PO TABS: 15.0000 mg | ORAL_TABLET | Freq: Every day | ORAL | 3 refills | Status: AC

## 2018-03-02 NOTE — Progress Notes (Signed)
NEUROLOGY FOLLOW UP OFFICE NOTE  Beth Wilkins [id number]  DOB: 1940-07-10  HISTORY OF PRESENT ILLNESS: I had the pleasure of seeing Beth Wilkins in follow-up in the neurology clinic on 03/02/2018. 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 Unable to complete MMSE on last visit due to aphasia. Her MMSE was 9/30 in November 2017 (13/30 in June 2016, 11/30 in December 2015). She is again accompanied by her daughter Beth Wilkins who helps supplement the history today. Since her last visit, Beth Wilkins reports that patient continued chemotherapy until early this month. During chemotherapy with steroid treatment, she would have worsened hallucinations with combativeness. This seems to have quieted down now that she has stopped treatments. She has hallucinations, usually seeing children or a man that she says is her son. 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 Beth Wilkins has noticed her right eye outward deviation, she has seen Ophthalmology with normal vision exam. It was felt this may be due to her chemo, or due to underlying neurodegenerative condition. She does not complain of diplopia, dizziness, or headaches. No focal weakness. Beth Wilkins reports that she interacts with family members, she can hold a bottle of water and drink from it. Beth Wilkins puts pills in her hand and she takes it. She walks without assistance at home and does not bump into things. She needs to be fed when tired, but she is able to use a fork and feed herself, Beth Wilkins notes that sometimes she would be moving the fork around the plate so Beth Wilkins moves the plate and she can stab the food with the fork. She needs help with dressing and bathing. Beth Wilkins has started CBD gummies which seem to make her more coherent sometimes. She was taking melatonin  while doing chemotherapy, waking up several times at night, but since stopping chemo, Beth Wilkins has started weaning it and last night she slept through the night without melatonin. She is also taking mirtazapine 15mg  qhs and Seroquel 25mg  qhs without side effects. She has a caregiver Beth Wilkins who comes and keeps her active. Beth Wilkins remains her primary caregiver. She is now with Hospice care.  HPI: This is a 77 yo RH woman with a history of hypertension and hyperlipidemia with memory loss that her daughter started to notice in September 2013. Her daughter is a Catering manager and was away for 2 months in 2013, when she got home in September 2013, their house was "ridiculously hot" over 90 degrees. Her mother did not seem concerned about it and stated the [facility] was not working, however her daughter became concerned that this was not addressed by the patient. She had fallen in 01/2013 and sustained a pelvic fracture. Since then, her daughter started noticing short-term memory problems, asking repeated questions, with focusing difficulties, worse with stress or when people are arguing around her, making her agitated. The patient reports that she would think of something then get tangled, taking a while to get her thoughts back. Her daughter has noticed that she can spell words but sometimes would not recognize them (she could not recognize if it was December or January written down). She reports that with prescription glasses, she was able to put words together. Her daughter is concerned that she has to practice writing a check beforehand, however the patient reports that she has always done this.  She has only missed paying one bill, her daughter reports she has been very good with this. She occasionally forgets to take her medications and uses a pillbox. Her family has not allowed her to drive anymore.  Diagnostic Data: MRI brain does not show any evidence of focal lesions such as mass or infarct. There is moderate  generalized atrophy that appears more significant over the left temporal and occipital lobes, however images are slightly tilted.   PAST MEDICAL HISTORY: Past Medical History:  Diagnosis Date  . Anxiety   . Aortic atherosclerosis ([place]) 03/15/2017   noted on PET   . Arthritis   . Bilateral renal cysts   . Centrilobular emphysema ([place]) per 05-10-16 chest xray  . CKD (chronic kidney disease), stage III ([place])   . 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 Complication of anesthesia    can be combative due to dementia  . Full dentures   . Headache    occ stress related  . History of chemotherapy 2019   last chemotherapy infusion 09/29/2017  . History of gastroesophageal reflux (GERD)    changes diet  . History of pelvic fracture 2015  . Hyperlipidemia   . Hypertension   . Liver metastasis ([place])   . Moderate dementia without behavioral disturbance ([facility])    moderate to severe dementia per neurologist note (dr Krista Blue) in epic  . N&V (nausea and vomiting)   . Pulmonary nodules   . Right ovarian cyst   . SBO (small bowel obstruction) ([place]) 01/2015  . 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 UTI (urinary tract infection) 09/26/2016   5 day antibiotic treatment    MEDICATIONS: Current Outpatient Medications on File Prior to Visit  Medication Sig Dispense Refill  . amLODipine (NORVASC) 5 MG tablet Take 5 mg by mouth every morning.     Marland Kitchen aspirin 81 MG tablet Take 81 mg by mouth daily.     . Cholecalciferol (VITAMIN D3) 2000 UNITS TABS Take by mouth daily.    . Coenzyme  Q10 (CO Q 10) 100 MG CAPS Take by mouth daily.    Marland Kitchen lidocaine-prilocaine (EMLA) cream Apply 1 application topically as needed. 30 g 5  . medium chain triglycerides (MCT OIL) oil Take 5 mLs by mouth 2 (two) times daily.     . Melatonin 5 MG TABS Take 5-15 mg by mouth at bedtime as needed.    . Meth-Hyo-M Bl-Na Phos-Ph Sal (URIBEL) 118 MG CAPS Take 1 capsule (118 mg total) by mouth 2 (two) times daily as needed. 30 capsule 3  . mirabegron ER (MYRBETRIQ) 50 MG TB24 tablet Take 1 tablet (50 mg total) by mouth daily. 30 tablet 11  . mirtazapine (REMERON) 15 MG tablet Take 1 tablet (15 mg total) by mouth at bedtime. 30 tablet 0  . Multiple Vitamins-Minerals (WOMENS MULTIVITAMIN PO) Take 1 tablet by mouth daily. With iron    . polyethylene glycol (MIRALAX / GLYCOLAX) packet Take 17 g by mouth daily. (Patient taking differently: Take 17 g by mouth daily as needed. ) 14 each  0  . prochlorperazine (COMPAZINE) 5 MG tablet Take 1 tablet (5 mg total) by mouth every 6 (six) hours as needed for nausea or vomiting. 30 tablet 1  . QUEtiapine (SEROQUEL) 25 MG tablet Take 1 tablet each night 30 tablet 0  . UNABLE TO FIND Med Name: Closys Mouth Rinse two-three times daily for ulcer.    . vitamin B-12 (CYANOCOBALAMIN) 1000 MCG tablet Take 1,000 mcg by mouth daily.     Current Facility-Administered Medications on File Prior to Visit  Medication Dose Route Frequency Provider Last Rate Last Dose  . heparin lock flush 100 unit/mL  500 Units Intravenous Once [name], MD        ALLERGIES: Allergies  Allergen Reactions  . Namenda [Memantine Hcl] Other (See Comments)    Hallucinations and headaches  . Oxycodone Other (See Comments)    Hallucinations     FAMILY HISTORY: Family History  Problem Relation Age of Onset  . Diabetes Mellitus II Father   . Cancer Mother 84       ovarian cancer   . Cervical cancer Other     SOCIAL HISTORY: Social History   Socioeconomic History  . Marital status: Single     Spouse name: Not on file  . Number of children: Not on file  . Years of education: Not on file  . Highest education level: Not on file  Occupational History  . Not on file  Social Needs  . Financial resource strain: Not on file  . Food insecurity:    Worry: Not on file    Inability: Not on file  . Transportation needs:    Medical: Not on file    Non-medical: Not on file  Tobacco Use  . Smoking status: Former Smoker    Packs/day: 0.25    Years: 50.00    Pack years: 12.50    Types: Cigarettes    Last attempt to quit: 01/25/2015    Years since quitting: 3.1  . Smokeless tobacco: Never Used  Substance and Sexual Activity  . Alcohol use: Yes    Alcohol/week: 3.0 standard drinks    Types: 2 Glasses of wine, 1 Cans of beer per week    Comment: occasional   . Drug use: No  . Sexual activity: Not Currently  Lifestyle  . Physical activity:    Days per week: Not on file    Minutes per session: Not on file  . Stress: Not on file  Relationships  . Social connections:    Talks on phone: Not on file    Gets together: Not on file    Attends religious service: Not on file    Active member of club or organization: Not on file    Attends meetings of clubs or organizations: Not on file    Relationship status: Not on file  . Intimate partner violence:    Fear of current or ex partner: Not on file    Emotionally abused: Not on file    Physically abused: Not on file    Forced sexual activity: Not on file  Other Topics Concern  . Not on file  Social History Narrative   Legally separated   Lives w/daughter, Beth Wilkins   Has total of #2 daughters and #1 son   Dementia   Fall Risk-ambulates w/cane    REVIEW OF SYSTEMS unable to obtain due to aphasia/dementia  PHYSICAL EXAM: Vitals:   03/02/18 0950  BP: (!) 146/78  Pulse: (!) 105  SpO2: 100%  General: No acute distress Head:  Normocephalic/atraumatic Neck: supple, no paraspinal tenderness, full range of motion Heart:   Regular rate and rhythm Lungs:  Clear to auscultation bilaterally Back: No paraspinal tenderness Skin/Extremities: No rash, no edema Neurological Exam: alert and oriented to person. She is again making paraphasic errors, unable to name, repeat, or follow commands. When asked how many grandkids she has, she enumerates random numbers. Cranial nerves: Pupils equal, round, reactive to light. Extraocular movements: she is noted to have a right esotropia (new from prior visit), unable to follow commands for EOM testing. No blink to threat bilaterally. No facial asymmetry. Tongue, uvula, palate midline. Motor: Bulk and tone normal, muscle strength 5/5 throughout with no pronator drift. Sensation withdraws to touch bilaterally. Deep tendon reflexes 2+ throughout, toes downgoing. Unable to do finger to nose testing, she does not track or focus on examiner. Gait: hunched posture with slow small unsteady steps. She is unable to follow commands to walk to a certain direction and almost bumps into doorframe, able to follow Beth Wilkins's voice back into exam room. Did not follow instructions to sit back on wheelchair and sat on chair on her left.   IMPRESSION: This is a 77 yo RH woman with a history of hypertension and hyperlipidemia, who initially presented with memory loss and visual deficits (left neglect) and alexia, with progressive cognitive decline. Again unable to do MMSE testing today. Visual deficits are more profound, she is unable to focus or track or reach for objects. She is aphasic with paraphasic errors. Beth Wilkins is surprised with her deficits in the office today, stating she can walk around the house without bumping into walls/objects and can feed herself. Continue 24/7 care, she is now in Hospice. Beth Wilkins feels Seroquel 25mg  qhs and mirtazapine 25mg  qhs have been helpful, refills sent. Follow-up in 1 year, daughter knows to call for any changes.   Thank you for allowing me to participate in her care.   Please do not hesitate to call for any questions or concerns.  The duration of this appointment visit was 30 minutes of face-to-face time with the patient.  Greater than 50% of this time was spent in counseling, explanation of diagnosis, planning of further management, and coordination of care.   Ellouise Newer, M.D.   CC: Dr. Drema Dallas, Dr. Burr Medico

## 2018-03-02 NOTE — Patient Instructions (Signed)
Good seeing both of you. Continue Mirtazapine 15mg  every night and Seroquel 25mg  every night. Continue 24/7 care, hospice care. Follow-up in 1 year, call for any changes.

## 2018-03-09 ENCOUNTER — Ambulatory Visit: Payer: Medicare Other | Admitting: Adult Health

## 2018-03-09 ENCOUNTER — Other Ambulatory Visit: Payer: Medicare Other

## 2018-03-09 ENCOUNTER — Ambulatory Visit: Payer: Medicare Other

## 2018-03-14 ENCOUNTER — Telehealth: Payer: Self-pay

## 2018-03-14 MED FILL — MIRTAZAPINE 15 MG TABLET: 15 | 30 days supply | Qty: 30 | Fill #0

## 2018-03-14 MED FILL — QUETIAPINE 25 MG TABLET: 25 | 30 days supply | Qty: 30 | Fill #0

## 2018-03-14 NOTE — Telephone Encounter (Signed)
Faxed signed order to [facility] on 03/14/18, sent to [facility] for scanning to chart.

## 2018-03-16 MED FILL — traMADol HCL 50 MG TABS: 50 | 30 days supply | Qty: 120 | Fill #0

## 2018-03-23 ENCOUNTER — Ambulatory Visit: Payer: Medicare Other | Admitting: Hematology

## 2018-03-23 ENCOUNTER — Other Ambulatory Visit: Payer: Medicare Other

## 2018-03-23 ENCOUNTER — Ambulatory Visit: Payer: Medicare Other

## 2018-03-23 MED FILL — MYRBETRIQ ER 50 MG TABLET: 50 | 30 days supply | Qty: 30 | Fill #3

## 2018-04-03 MED FILL — AMLODIPINE BESYLATE 5 MG TA: 5 | 30 days supply | Qty: 30 | Fill #4

## 2018-04-10 ENCOUNTER — Other Ambulatory Visit: Payer: Self-pay | Admitting: Urology

## 2018-04-10 ENCOUNTER — Encounter (HOSPITAL_BASED_OUTPATIENT_CLINIC_OR_DEPARTMENT_OTHER): Payer: Self-pay

## 2018-04-12 ENCOUNTER — Encounter (HOSPITAL_BASED_OUTPATIENT_CLINIC_OR_DEPARTMENT_OTHER): Payer: Self-pay | Admitting: *Deleted

## 2018-04-12 ENCOUNTER — Emergency Department (HOSPITAL_BASED_OUTPATIENT_CLINIC_OR_DEPARTMENT_OTHER)

## 2018-04-12 ENCOUNTER — Encounter (HOSPITAL_COMMUNITY): Payer: Self-pay

## 2018-04-12 ENCOUNTER — Ambulatory Visit
Admission: EM | Admit: 2018-04-12 | Discharge: 2018-04-12 | Disposition: A | Discharge status: Home / Self Care | Source: Home / Self Care | Attending: Family Medicine | Admitting: Family Medicine

## 2018-04-12 ENCOUNTER — Other Ambulatory Visit: Payer: Self-pay

## 2018-04-12 ENCOUNTER — Emergency Department (HOSPITAL_COMMUNITY)
Admission: EM | Admit: 2018-04-12 | Discharge: 2018-04-12 | Disposition: A | Discharge status: Home / Self Care | Attending: Emergency Medicine | Admitting: Emergency Medicine

## 2018-04-12 ENCOUNTER — Emergency Department (HOSPITAL_BASED_OUTPATIENT_CLINIC_OR_DEPARTMENT_OTHER)
Admission: EM | Admit: 2018-04-12 | Discharge: 2018-04-12 | Disposition: A | Discharge status: Home / Self Care | Source: Home / Self Care | Attending: Emergency Medicine | Admitting: Emergency Medicine

## 2018-04-12 DIAGNOSIS — G301 Alzheimer's disease with late onset: Secondary | ICD-10-CM | POA: Insufficient documentation

## 2018-04-12 DIAGNOSIS — W01190A Fall on same level from slipping, tripping and stumbling with subsequent striking against furniture, initial encounter: Secondary | ICD-10-CM | POA: Insufficient documentation

## 2018-04-12 DIAGNOSIS — Y999 Unspecified external cause status: Secondary | ICD-10-CM

## 2018-04-12 DIAGNOSIS — F0390 Unspecified dementia without behavioral disturbance: Secondary | ICD-10-CM

## 2018-04-12 DIAGNOSIS — S0181XA Laceration without foreign body of other part of head, initial encounter: Secondary | ICD-10-CM | POA: Insufficient documentation

## 2018-04-12 DIAGNOSIS — W0110XA Fall on same level from slipping, tripping and stumbling with subsequent striking against unspecified object, initial encounter: Secondary | ICD-10-CM

## 2018-04-12 DIAGNOSIS — Y939 Activity, unspecified: Secondary | ICD-10-CM

## 2018-04-12 DIAGNOSIS — K219 Gastro-esophageal reflux disease without esophagitis: Secondary | ICD-10-CM | POA: Insufficient documentation

## 2018-04-12 DIAGNOSIS — I129 Hypertensive chronic kidney disease with stage 1 through stage 4 chronic kidney disease, or unspecified chronic kidney disease: Secondary | ICD-10-CM

## 2018-04-12 DIAGNOSIS — R51 Headache: Secondary | ICD-10-CM | POA: Insufficient documentation

## 2018-04-12 DIAGNOSIS — Z5321 Procedure and treatment not carried out due to patient leaving prior to being seen by health care provider: Secondary | ICD-10-CM | POA: Diagnosis not present

## 2018-04-12 DIAGNOSIS — Z85038 Personal history of other malignant neoplasm of large intestine: Secondary | ICD-10-CM

## 2018-04-12 DIAGNOSIS — Z7982 Long term (current) use of aspirin: Secondary | ICD-10-CM | POA: Insufficient documentation

## 2018-04-12 DIAGNOSIS — S01111A Laceration without foreign body of right eyelid and periocular area, initial encounter: Secondary | ICD-10-CM | POA: Diagnosis not present

## 2018-04-12 DIAGNOSIS — Z79899 Other long term (current) drug therapy: Secondary | ICD-10-CM | POA: Insufficient documentation

## 2018-04-12 DIAGNOSIS — I10 Essential (primary) hypertension: Secondary | ICD-10-CM | POA: Diagnosis not present

## 2018-04-12 DIAGNOSIS — Z66 Do not resuscitate: Secondary | ICD-10-CM | POA: Diagnosis not present

## 2018-04-12 DIAGNOSIS — Y92239 Unspecified place in hospital as the place of occurrence of the external cause: Secondary | ICD-10-CM | POA: Insufficient documentation

## 2018-04-12 DIAGNOSIS — N183 Chronic kidney disease, stage 3 (moderate): Secondary | ICD-10-CM | POA: Insufficient documentation

## 2018-04-12 DIAGNOSIS — Y92009 Unspecified place in unspecified non-institutional (private) residence as the place of occurrence of the external cause: Secondary | ICD-10-CM

## 2018-04-12 DIAGNOSIS — Z87891 Personal history of nicotine dependence: Secondary | ICD-10-CM

## 2018-04-12 DIAGNOSIS — F0280 Dementia in other diseases classified elsewhere without behavioral disturbance: Secondary | ICD-10-CM | POA: Diagnosis not present

## 2018-04-12 MED ORDER — LIDOCAINE-EPINEPHRINE-TETRACAINE (LET) SOLUTION
3.0000 mL | Freq: Once | NASAL | Status: AC
  Administered 2018-04-12: 3 mL via TOPICAL
  Filled 2018-04-12: qty 3

## 2018-04-12 MED ORDER — LIDOCAINE-EPINEPHRINE (PF) 2 %-1:200000 IJ SOLN
10.0000 mL | Freq: Once | INTRAMUSCULAR | Status: AC
  Administered 2018-04-12: 10 mL
  Filled 2018-04-12 (×2): qty 10

## 2018-04-12 NOTE — Discharge Instructions (Addendum)
Recommend wound check with your doctor in 2 days, return to the ER for any worsening or concerning symptoms.  1 suture to remove in 7 days

## 2018-04-12 NOTE — Discharge Instructions (Addendum)
Given patient's complicated medical hx and age I am recommending further evaluation and management in the ED.  Concern for potential brain bleed.  Patient's daughter is aware and in agreement with this plan.  Will go by private vehicle to [place] ED.

## 2018-04-12 NOTE — ED Triage Notes (Signed)
Pt is dementia, lives with daughter. States fell and hit her head on the coffee table

## 2018-04-12 NOTE — ED Provider Notes (Signed)
[facility] EMERGENCY DEPARTMENT Provider Note   CSN: [id number] Arrival date & time: 04/12/18  1756     History   Chief Complaint Chief Complaint  Patient presents with  . Laceration    HPI Beth Wilkins is a 78 y.o. female.  78 year old female with history of Alzheimer's brought to the emergency room by her daughter for laceration to the right forehead, not on blood thinners.  Patient's daughter is power of attorney and lives with her, states that the patient was home with a caretaker when she had a fall resulting a laceration to her right forehead.  Caretaker states she was in another room, patient stood up and she saw the patient falling but was unable to break her fall. Unsure what the patient hit her head on, no LOC. Patient denies any complaints. Daughter reports patient function slightly better than her baseline (states she is talking which she normally does not do). No other injuries, complaints, concerns.      Past Medical History:  Diagnosis Date  . Anxiety   . Aortic atherosclerosis ([place]) 03/15/2017   noted on PET   . Arthritis   . Bilateral renal cysts   . Cataract    History of bilateral  . Centrilobular emphysema ([place]) per 05-10-16 chest xray  . CKD (chronic kidney disease), stage III ([place])   . 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 Complication of anesthesia    can be combative due to dementia  . Fibrosis, uterus   . Full dentures   . GERD (gastroesophageal reflux disease)   . Headache    occ stress related  . History of chemotherapy 2019   last chemotherapy infusion 09/29/2017  . History of gastroesophageal reflux (GERD)    changes diet  . History of pelvic fracture 2015  . Hyperlipidemia   . Hypertension   . Liver metastasis ([place])   . Moderate dementia without behavioral disturbance (H[facility])    moderate to severe dementia per neurologist note (dr Krista Blue) in epic  . N&V (nausea and vomiting)   . Pulmonary nodules   . Right ovarian cyst   . SBO (small bowel obstruction) ([place]) 01/2015  . 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 UTI (urinary tract infection) 09/26/2016   5 day antibiotic treatment    Patient Active Problem List   Diagnosis Date Noted  . Encounter for antineoplastic chemotherapy 05/12/2017  . DNR (do not resuscitate) 09/10/2016  . Goals of care, counseling/discussion 04/06/2016  . Dementia ([place]) 05/12/2015  . Anemia of chronic disease 03/31/2015  . Cancer of right colon ([place]) 02/05/2015  . Absolute anemia   . Perforated appendix s/p right colectomy 01/24/2015 01/25/2015  . Hypokalemia 01/25/2015  . Leukocytosis 01/25/2015  . SBO (small bowel obstruction) ([place]) 01/21/2015  . ARF (acute renal failure) ([place]) 01/21/2015  . Moderate dementia without behavioral disturbance ([place]) 09/02/2014  . Memory loss 08/16/2013  . GERD (gastroesophageal reflux disease) 02/15/2013  . Pelvis fracture ([place]) 01/31/2013  . HTN (hypertension) 01/31/2013  . HLD (hyperlipidemia) 01/31/2013    Past Surgical  History:  Procedure Laterality Date  . CATARACT EXTRACTION W/ INTRAOCULAR LENS  IMPLANT, BILATERAL  2005 approx  . CYSTOSCOPY W/ URETERAL STENT PLACEMENT Right 12/03/2015   Procedure: CYSTOSCOPY WITH RETROGRADE PYELOGRAM/URETERAL STENT PLACEMENT;  Surgeon: Cleon Gustin, MD;  Location: [facility];  Service: Urology;  Laterality: Right;  . CYSTOSCOPY W/ URETERAL STENT PLACEMENT Right 02/15/2016   Procedure: CYSTOSCOPY WITH RETROGRADE PYELOGRAM/URETERAL STENT REPLACEMENT;  Surgeon:  Cleon Gustin, MD;  Location: [facility];  Service: Urology;  Laterality: Right;  . CYSTOSCOPY W/ URETERAL STENT PLACEMENT Right 03/28/2016   Procedure: CYSTOSCOPY WITH RETROGRADE PYELOGRAM/URETERAL STENT EXCHANGE;  Surgeon: Cleon Gustin, MD;  Location: [facility];  Service: Urology;  Laterality: Right;  . CYSTOSCOPY W/ URETERAL STENT PLACEMENT Right 07/18/2016   Procedure: CYSTOSCOPY WITH RETROGRADE PYELOGRAM/ STENT EXCHANGE;  Surgeon: Cleon Gustin, MD;  Location[facility];  Service: Urology;  Laterality: Right;  . CYSTOSCOPY W/ URETERAL STENT PLACEMENT Right 10/10/2016   Procedure: CYSTOSCOPY WITH RETROGRADE PYELOGRAM/URETERAL STENT REPLACEMENT;  Surgeon: Cleon Gustin, MD;  Location: [facility];  Service: Urology;  Laterality: Right;  . CYSTOSCOPY W/ URETERAL STENT PLACEMENT Right 04/10/2017   Procedure: CYSTOSCOPY WITH RETROGRADE PYELOGRAM/URETERAL STENT EXCHANGE;  Surgeon: Cleon Gustin, MD;  Location: [facility];  Service: Urology;  Laterality: Right;  . CYSTOSCOPY W/ URETERAL STENT PLACEMENT Right 10/09/2017   Procedure: CYSTOSCOPY WITH RETROGRADE PYELOGRAM/URETERAL STENT PLACEMENT;  Surgeon: Cleon Gustin, MD;  Location: [facility];  Service: Urology;  Laterality: Right;  30 MINS  . CYSTOSCOPY WITH RETROGRADE PYELOGRAM, URETEROSCOPY AND STENT PLACEMENT Right 09/21/2015   Procedure: CYSTOSCOPY WITH BIL RETROGRADE PYELOGRAM, URETEROSCOPY, RIGHT STENT PLACEMENT ;  Surgeon: Cleon Gustin, MD;  Location: [facility];  Service: Urology;  Laterality: Right;  . HEMICOLECTOMY Right 01/23/2015   Dr. Hassell Done  . IR FLUORO GUIDE PORT INSERTION RIGHT  03/24/2017  . IR US GUIDE VASC ACCESS RIGHT  03/24/2017  . LAPAROSCOPY N/A 01/23/2015   Procedure: DIAGNOSTIC LAPAROSCOPY, EXPLORATORY LAPAROTOMY  RIGHT HEMI-COLECTOMY FOR OBSTRUCTION OF RIGHT TERMINAL ILEUM;   Surgeon: [name], MD;  Location: [facility];  Service: General;  Laterality: N/A;  . PORTACATH PLACEMENT       OB History   No obstetric history on file.      Home Medications    Prior to Admission medications   Medication Sig Start Date End Date Taking? Authorizing Provider  amLODipine (NORVASC) 5 MG tablet Take 5 mg by mouth every morning.     [provider]  aspirin 81 MG tablet Take 81 mg by mouth daily.     [provider]  Cholecalciferol (VITAMIN D3) 2000 UNITS TABS Take by mouth daily.    [provider]  Coenzyme Q10 (CO Q 10) 100 MG CAPS Take by mouth daily.    [provider]  lidocaine-prilocaine (EMLA) cream Apply 1 application topically as needed. 03/31/17   [name], MD  medium chain triglycerides (MCT OIL) oil Take 5 mLs by mouth 2 (two) times daily.  08/18/17   [provider]  Melatonin 5 MG TABS Take 5-15 mg by mouth at bedtime as needed.    [provider]  Meth-Hyo-M Bl-Na Phos-Ph Sal (URIBEL) 118 MG CAPS Take 1 capsule (118 mg total) by mouth 2 (two) times daily as needed. 10/10/16   McKenzie, [name], MD  mirabegron ER (MYRBETRIQ) 50 MG TB24 tablet Take 1  tablet (50 mg total) by mouth daily. 04/10/17   McKenzie, [name], MD  mirtazapine (REMERON) 15 MG tablet Take 1 tablet (15 mg total) by mouth at bedtime. 03/02/18   Cameron Sprang, MD  Multiple Vitamins-Minerals (WOMENS MULTIVITAMIN PO) Take 1 tablet by mouth daily. With iron    [provider]  polyethylene glycol (MIRALAX / GLYCOLAX) packet Take 17 g by mouth daily. Patient taking differently: Take 17 g by mouth daily as needed.  01/30/15   Verlee Monte, MD  prochlorperazine (COMPAZINE) 5 MG tablet Take 1 tablet (5 mg total) by mouth every 6 (six) hours as needed for nausea or vomiting. 02/09/18   [name], MD  QUEtiapine (SEROQUEL) 25 MG tablet Take 1 tablet each night 03/02/18   Cameron Sprang, MD  UNABLE TO FIND Med Name: Closys Mouth Rinse  two-three times daily for ulcer.    [provider]  vitamin B-12 (CYANOCOBALAMIN) 1000 MCG tablet Take 1,000 mcg by mouth daily.    [provider]    Family History Family History  Problem Relation Age of Onset  . Diabetes Mellitus II Father   . Cancer Mother 77       ovarian cancer   . Cervical cancer Other     Social History Social History   Tobacco Use  . Smoking status: Former Smoker    Packs/day: 0.25    Years: 50.00    Pack years: 12.50    Types: Cigarettes    Last attempt to quit: 01/25/2015    Years since quitting: 3.2  . Smokeless tobacco: Never Used  Substance Use Topics  . Alcohol use: Not Currently    Alcohol/week: 3.0 standard drinks    Types: 2 Glasses of wine, 1 Cans of beer per week    Comment: occasional   . Drug use: No     Allergies   Namenda [memantine hcl] and Oxycodone   Review of Systems Review of Systems  Unable to perform ROS: Dementia  Skin: Positive for wound.     Physical Exam Updated Vital Signs BP 124/78 (BP Location: Right Arm)   Pulse 79   Temp (!) 97.3 F (36.3 C) (Oral)   Resp 18   SpO2 99%   Physical Exam Vitals signs and nursing note reviewed.  Constitutional:      General: She is not in acute distress.    Appearance: She is well-developed. She is not diaphoretic.  HENT:     Head: Normocephalic.      Nose: Nose normal.     Mouth/Throat:     Mouth: Mucous membranes are moist.  Eyes:     Pupils: Pupils are equal, round, and reactive to light.  Neck:     Musculoskeletal: Normal range of motion and neck supple. No muscular tenderness.  Cardiovascular:     Rate and Rhythm: Normal rate and regular rhythm.     Pulses: Normal pulses.     Heart sounds: Normal heart sounds.  Pulmonary:     Effort: Pulmonary effort is normal.     Breath sounds: Normal breath sounds.  Musculoskeletal:        General: No swelling, tenderness, deformity or signs of injury.  Skin:    General: Skin is warm and dry.       Findings: No erythema or rash.  Neurological:     Mental Status: She is alert. Mental status is at baseline.      ED Treatments / Results  Labs (all labs  ordered are listed, but only abnormal results are displayed) Labs Reviewed - No data to display  EKG None  Radiology Ct Head Wo Contrast  Result Date: 04/12/2018 CLINICAL DATA:  Fall today with laceration to forehead. History of dementia. EXAM: CT HEAD WITHOUT CONTRAST CT CERVICAL SPINE WITHOUT CONTRAST TECHNIQUE: Multidetector CT imaging of the head and cervical spine was performed following the standard protocol without intravenous contrast. Multiplanar CT image reconstructions of the cervical spine were also generated. COMPARISON:  None. FINDINGS: CT HEAD FINDINGS Brain: Diffuse cerebral atrophy. Ventricular dilatation likely due to central atrophy. Mild low attenuation change in the periventricular white matter consistent small vessel ischemia. No mass-effect or midline shift. No abnormal extra-axial fluid collections. Gray-white matter junctions are distinct. Basal cisterns are not effaced. No acute intracranial hemorrhage. Vascular: No hyperdense vessel or unexpected calcification. Skull: Calvarium appears intact. No acute depressed skull fractures. Sinuses/Orbits: Paranasal sinuses and mastoid air cells are clear. Other: Subcutaneous scalp laceration over the right anterior frontal region. CT CERVICAL SPINE FINDINGS Alignment: Normal alignment of the cervical vertebrae and facet joints. C1-2 articulation appears intact. Skull base and vertebrae: Skull base appears intact. No vertebral compression deformities. No focal bone lesion or bone destruction. Bone cortex appears intact. Soft tissues and spinal canal: No prevertebral soft tissue swelling. No abnormal paraspinal soft tissue mass or infiltration. Disc levels: Degenerative changes throughout the cervical spine with narrowed disc spaces and endplate hypertrophic changes at C3-4,  C4-5, C5-6, and C6-7 levels. Degenerative changes in the cervical facet joints. Cervical facet osteophytes and uncovertebral spurring causes bone encroachment upon the neural foramina at multiple levels bilaterally. Upper chest: Emphysematous changes in the lung apices. Left thyroid gland nodule measuring 11 mm, likely benign based on size criteria. Right central venous catheter. Other: None. IMPRESSION: 1. No acute intracranial abnormalities. Chronic atrophy and small vessel ischemic changes. 2. Normal alignment of the cervical spine. Degenerative changes. No acute displaced fractures identified. Electronically Signed   By: Lucienne Capers M.D.   On: 04/12/2018 21:09   Ct Cervical Spine Wo Contrast  Result Date: 04/12/2018 CLINICAL DATA:  Fall today with laceration to forehead. History of dementia. EXAM: CT HEAD WITHOUT CONTRAST CT CERVICAL SPINE WITHOUT CONTRAST TECHNIQUE: Multidetector CT imaging of the head and cervical spine was performed following the standard protocol without intravenous contrast. Multiplanar CT image reconstructions of the cervical spine were also generated. COMPARISON:  None. FINDINGS: CT HEAD FINDINGS Brain: Diffuse cerebral atrophy. Ventricular dilatation likely due to central atrophy. Mild low attenuation change in the periventricular white matter consistent small vessel ischemia. No mass-effect or midline shift. No abnormal extra-axial fluid collections. Gray-white matter junctions are distinct. Basal cisterns are not effaced. No acute intracranial hemorrhage. Vascular: No hyperdense vessel or unexpected calcification. Skull: Calvarium appears intact. No acute depressed skull fractures. Sinuses/Orbits: Paranasal sinuses and mastoid air cells are clear. Other: Subcutaneous scalp laceration over the right anterior frontal region. CT CERVICAL SPINE FINDINGS Alignment: Normal alignment of the cervical vertebrae and facet joints. C1-2 articulation appears intact. Skull base and  vertebrae: Skull base appears intact. No vertebral compression deformities. No focal bone lesion or bone destruction. Bone cortex appears intact. Soft tissues and spinal canal: No prevertebral soft tissue swelling. No abnormal paraspinal soft tissue mass or infiltration. Disc levels: Degenerative changes throughout the cervical spine with narrowed disc spaces and endplate hypertrophic changes at C3-4, C4-5, C5-6, and C6-7 levels. Degenerative changes in the cervical facet joints. Cervical facet osteophytes and uncovertebral spurring causes bone encroachment upon the  neural foramina at multiple levels bilaterally. Upper chest: Emphysematous changes in the lung apices. Left thyroid gland nodule measuring 11 mm, likely benign based on size criteria. Right central venous catheter. Other: None. IMPRESSION: 1. No acute intracranial abnormalities. Chronic atrophy and small vessel ischemic changes. 2. Normal alignment of the cervical spine. Degenerative changes. No acute displaced fractures identified. Electronically Signed   By: Lucienne Capers M.D.   On: 04/12/2018 21:09    Procedures .Marland KitchenLaceration Repair Date/Time: 04/12/2018 11:26 PM Performed by: Tacy Learn, PA-C Authorized by: Tacy Learn, PA-C   Consent:    Consent obtained:  Verbal   Consent given by:  Guardian and healthcare agent   Risks discussed:  Infection, need for additional repair, pain, poor cosmetic result and poor wound healing   Alternatives discussed:  No treatment and delayed treatment Universal protocol:    Procedure explained and questions answered to patient or proxy's satisfaction: yes     Relevant documents present and verified: yes     Test results available and properly labeled: yes     Imaging studies available: yes     Required blood products, implants, devices, and special equipment available: yes     Site/side marked: yes     Immediately prior to procedure, a time out was called: yes     Patient identity  confirmed:  Verbally with patient Anesthesia (see MAR for exact dosages):    Anesthesia method:  Local infiltration and topical application   Topical anesthetic:  LET   Local anesthetic:  Lidocaine 2% WITH epi Laceration details:    Location:  Face   Face location:  Forehead   Length (cm):  2.5   Depth (mm):  4 Repair type:    Repair type:  Simple Pre-procedure details:    Preparation:  Patient was prepped and draped in usual sterile fashion and imaging obtained to evaluate for foreign bodies Exploration:    Hemostasis achieved with:  Epinephrine and LET   Wound extent: no foreign bodies/material noted and no underlying fracture noted     Contaminated: no   Treatment:    Area cleansed with:  Saline   Amount of cleaning:  Extensive   Irrigation solution:  Sterile saline Skin repair:    Repair method:  Sutures   Suture size:  4-0   Suture material:  Prolene   Suture technique:  Horizontal mattress   Number of sutures:  1 Approximation:    Approximation:  Close Post-procedure details:    Dressing:  Adhesive bandage   Patient tolerance of procedure:  Tolerated well, no immediate complications   (including critical care time)  Medications Ordered in ED Medications  lidocaine-EPINEPHrine-tetracaine (LET) solution (3 mLs Topical Given 04/12/18 1832)  lidocaine-EPINEPHrine (XYLOCAINE W/EPI) 2 %-1:200000 (PF) injection 10 mL (10 mLs Infiltration Given 04/12/18 1832)     Initial Impression / Assessment and Plan / ED Course  I have reviewed the triage vital signs and the nursing notes.  Pertinent labs & imaging results that were available during my care of the patient were reviewed by me and considered in my medical decision making (see chart for details).  Clinical Course as of Apr 12 2326  Thu Apr 12, 7637  567 78 year old female with history of Alzheimer's presents with her daughter for laceration to the right forehead.  Daughter was told by caretaker that patient fell and the  caretaker was unable to catch her in time resulting in the laceration.  No loss of consciousness reported, patient  is reported to be acting at her baseline or better.  CT of the head and C-spine are unremarkable.  Wound was closed with 1 mattress suture.  Recommend wound recheck in 2 days and suture removal in 7 days.   [LM]    Clinical Course User Index [LM] Tacy Learn, PA-C   Final Clinical Impressions(s) / ED Diagnoses   Final diagnoses:  Laceration of forehead, initial encounter    ED Discharge Orders    None       Tacy Learn, PA-C 04/12/18 2328    Fredia Sorrow, MD 04/13/18 [phone]

## 2018-04-12 NOTE — ED Provider Notes (Signed)
[place]   [id number] 04/12/18 Arrival Time: 1635  CX:[id number]  Abbreviated note  SUBJECTIVE: HPI limited due to patient with alzheimers and dementia.  Presents with daughter  Beth Wilkins is a 78 y.o. female who presents with laceration to RT forehead that occurred earlier today after she tripped and fell striking head on table.  Pt hx significant for alzheimer's, dementia, and currently in hospice for colon cancer.  Pt was with caretaker at that time, but unsure if patient LOC.  Denies active bleeding.     Past Medical History:  Diagnosis Date  . Anxiety   . Aortic atherosclerosis ([place]) 03/15/2017   noted on PET   . Arthritis   . Bilateral renal cysts   . Cataract    History of bilateral  . Centrilobular emphysema ([place]) per 05-10-16 chest xray  . CKD (chronic kidney disease), stage III ([place])   . 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 Complication of anesthesia    can be combative due to dementia  . Fibrosis, uterus   . Full dentures   . GERD (gastroesophageal reflux disease)   . Headache    occ stress related  . History of chemotherapy 2019   last chemotherapy infusion 09/29/2017  . History of gastroesophageal reflux (GERD)    changes diet  . History of pelvic fracture 2015  . Hyperlipidemia   . Hypertension   . Liver metastasis ([place])   . Moderate dementia without behavioral disturbance (HCC)    moderate to severe dementia per neurologist note (dr Krista Blue) in epic  . N&V (nausea and vomiting)   . Pulmonary nodules   . Right ovarian cyst   . SBO (small bowel obstruction) ([place]) 01/2015  . 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 UTI (urinary tract infection) 09/26/2016   5 day antibiotic treatment   Past Surgical History:  Procedure Laterality Date  . CATARACT EXTRACTION W/ INTRAOCULAR LENS  IMPLANT, BILATERAL  2005 approx  . CYSTOSCOPY W/ URETERAL STENT PLACEMENT Right 12/03/2015   Procedure: CYSTOSCOPY WITH RETROGRADE PYELOGRAM/URETERAL STENT PLACEMENT;  Surgeon: Cleon Gustin, MD;  Location: [facility];  Service: Urology;  Laterality: Right;  . CYSTOSCOPY W/ URETERAL STENT PLACEMENT Right 02/15/2016   Procedure: CYSTOSCOPY WITH RETROGRADE PYELOGRAM/URETERAL STENT REPLACEMENT;  Surgeon: Cleon Gustin, MD;  Location: [facility];  Service: Urology;  Laterality: Right;  . CYSTOSCOPY W/ URETERAL STENT PLACEMENT Right 03/28/2016   Procedure: CYSTOSCOPY WITH RETROGRADE PYELOGRAM/URETERAL STENT EXCHANGE;  Surgeon: Cleon Gustin, MD;  Location: [facility];  Service: Urology;  Laterality: Right;  . CYSTOSCOPY W/ URETERAL STENT PLACEMENT Right 07/18/2016   Procedure: CYSTOSCOPY WITH RETROGRADE PYELOGRAM/ STENT EXCHANGE;  Surgeon: Cleon Gustin, MD;  Location: [facility];  Service: Urology;  Laterality: Right;  . CYSTOSCOPY W/ URETERAL STENT PLACEMENT Right 10/10/2016   Procedure: CYSTOSCOPY WITH RETROGRADE PYELOGRAM/URETERAL STENT REPLACEMENT;  Surgeon: Cleon Gustin, MD;  Location: [facility];  Service: Urology;  Laterality: Right;  . CYSTOSCOPY W/ URETERAL STENT PLACEMENT Right  04/10/2017   Procedure: CYSTOSCOPY WITH RETROGRADE PYELOGRAM/URETERAL STENT EXCHANGE;  Surgeon: Cleon Gustin, MD;  Location: [facility];  Service: Urology;  Laterality: Right;  . CYSTOSCOPY W/ URETERAL STENT PLACEMENT Right 10/09/2017   Procedure: CYSTOSCOPY WITH RETROGRADE PYELOGRAM/URETERAL STENT PLACEMENT;  Surgeon: Cleon Gustin, MD;  Location: [facility];  Service: Urology;  Laterality: Right;  30 MINS  . CYSTOSCOPY WITH RETROGRADE PYELOGRAM, URETEROSCOPY AND STENT PLACEMENT Right 09/21/2015   Procedure: CYSTOSCOPY WITH BIL RETROGRADE PYELOGRAM, URETEROSCOPY, RIGHT STENT PLACEMENT ;  Surgeon: Cleon Gustin, MD;  Location: [facility];  Service: Urology;  Laterality: Right;  . HEMICOLECTOMY Right 01/23/2015   Dr. Hassell Done  . IR FLUORO GUIDE PORT INSERTION RIGHT  03/24/2017  . IR US GUIDE VASC ACCESS RIGHT  03/24/2017  . LAPAROSCOPY N/A 01/23/2015   Procedure: DIAGNOSTIC LAPAROSCOPY, EXPLORATORY LAPAROTOMY  RIGHT HEMI-COLECTOMY FOR OBSTRUCTION OF RIGHT TERMINAL ILEUM;  Surgeon: [name], MD;  Location: [facility];  Service: General;  Laterality: N/A;  . PORTACATH PLACEMENT     Allergies  Allergen Reactions  . Namenda [Memantine Hcl] Other (See Comments)    Hallucinations and headaches  . Oxycodone Other (See Comments)    Hallucinations    Current Facility-Administered Medications on File Prior to Encounter  Medication Dose Route Frequency Provider Last Rate Last Dose  . heparin lock flush 100 unit/mL  500 Units Intravenous Once [name], MD       Current Outpatient Medications on File Prior to Encounter  Medication Sig Dispense Refill  . amLODipine (NORVASC) 5 MG tablet Take 5 mg by mouth every morning.     Marland Kitchen aspirin 81 MG tablet Take 81 mg by mouth daily.     . Cholecalciferol (VITAMIN D3) 2000 UNITS TABS Take by mouth daily.    . Coenzyme Q10 (CO Q 10) 100 MG CAPS Take by mouth daily.    Marland Kitchen lidocaine-prilocaine (EMLA) cream Apply 1 application topically as needed. 30 g 5  . medium chain triglycerides (MCT OIL) oil Take 5 mLs by mouth 2 (two) times daily.     . Melatonin 5 MG TABS Take 5-15 mg by mouth at bedtime as needed.    . Meth-Hyo-M Bl-Na Phos-Ph Sal (URIBEL) 118 MG CAPS Take 1 capsule (118 mg total) by mouth 2 (two) times daily as needed. 30 capsule 3  .  mirabegron ER (MYRBETRIQ) 50 MG TB24 tablet Take 1 tablet (50 mg total) by mouth daily. 30 tablet 11  . mirtazapine (REMERON) 15 MG tablet Take 1 tablet (15 mg total) by mouth at bedtime. 90 tablet 3  . Multiple Vitamins-Minerals (WOMENS MULTIVITAMIN PO) Take 1 tablet by mouth daily. With iron    . polyethylene glycol (MIRALAX / GLYCOLAX) packet Take 17 g by mouth daily. (Patient taking differently: Take 17 g by mouth daily as needed. ) 14 each 0  . prochlorperazine (COMPAZINE) 5 MG tablet Take 1 tablet (5 mg total) by mouth every 6 (six) hours as needed for nausea or vomiting. 30 tablet 1  . QUEtiapine (SEROQUEL) 25 MG tablet Take 1 tablet each night 90 tablet 3  . UNABLE TO FIND Med Name: Closys Mouth Rinse two-three times daily for ulcer.    . vitamin B-12 (CYANOCOBALAMIN) 1000 MCG tablet Take 1,000 mcg by mouth daily.     Social History   Socioeconomic History  . Marital status: Single    Spouse name: Not on file  . Number of children: Not on file  .  Years of education: Not on file  . Highest education level: Not on file  Occupational History  . Not on file  Social Needs  . Financial resource strain: Not on file  . Food insecurity:    Worry: Not on file    Inability: Not on file  . Transportation needs:    Medical: Not on file    Non-medical: Not on file  Tobacco Use  . Smoking status: Former Smoker    Packs/day: 0.25    Years: 50.00    Pack years: 12.50    Types: Cigarettes    Last attempt to quit: 01/25/2015    Years since quitting: 3.2  . Smokeless tobacco: Never Used  Substance and Sexual Activity  . Alcohol use: Not Currently    Alcohol/week: 3.0 standard drinks    Types: 2 Glasses of wine, 1 Cans of beer per week    Comment: occasional   . Drug use: No  . Sexual activity: Not Currently  Lifestyle  . Physical activity:    Days per week: Not on file    Minutes per session: Not on file  . Stress: Not on file  Relationships  . Social connections:    Talks on  phone: Not on file    Gets together: Not on file    Attends religious service: Not on file    Active member of club or organization: Not on file    Attends meetings of clubs or organizations: Not on file    Relationship status: Not on file  . Intimate partner violence:    Fear of current or ex partner: Not on file    Emotionally abused: Not on file    Physically abused: Not on file    Forced sexual activity: Not on file  Other Topics Concern  . Not on file  Social History Narrative   Legally separated   Lives w/daughter, Kaia [place]   Has total of #2 daughters and #1 son   Dementia   Fall Risk-ambulates w/cane   Family History  Problem Relation Age of Onset  . Diabetes Mellitus II Father   . Cancer Mother 27       ovarian cancer   . Cervical cancer Other      OBJECTIVE:  Vitals:   04/12/18 1652  BP: (!) 157/87  Pulse: 95  Resp: 18  SpO2: 95%     General appearance: alert; no distress Skin: laceration of right side of forehead approximately 1-2 cm, without active bleeding Psychological: alert, normal mood and affect at this time  ASSESSMENT & PLAN:  1. Laceration of eyebrow and forehead, right, initial encounter     Given patient's complicated medical hx and age I am recommending further evaluation and management in the ED.  Concern for potential brain bleed.  Patient's daughter is aware and in agreement with this plan.  Will go by private vehicle to [place] ED.      Lestine Box, PA-C 04/12/18 2019

## 2018-04-12 NOTE — ED Triage Notes (Signed)
Patient had a caretaker. Caretaker reported to the daughter that the patient tripped over an ottoman and hit her head on a table. Family not sure if the patient had LOC. Patient has a history of dementia.

## 2018-04-12 NOTE — ED Notes (Signed)
Pt referred to ED for CT scan and further evaluation; family notified and verbalized understanging

## 2018-04-12 NOTE — ED Triage Notes (Signed)
She tripped and fell per caregiver. Laceration above her right eye.

## 2018-04-13 ENCOUNTER — Encounter (HOSPITAL_BASED_OUTPATIENT_CLINIC_OR_DEPARTMENT_OTHER): Payer: Self-pay

## 2018-04-13 ENCOUNTER — Other Ambulatory Visit: Payer: Self-pay

## 2018-04-13 NOTE — Progress Notes (Signed)
Spoke with:  Beth NPO:  After Wilkins, no gum, candy, or mints   Arrival time:  1000AM Labs: Istat 4, EKG AM medications: Amlodipine, Myrbetriq Pre op orders:  Needs second sign Ride home:  Beth Wilkins (daughter) [phone]

## 2018-04-16 ENCOUNTER — Ambulatory Visit (HOSPITAL_BASED_OUTPATIENT_CLINIC_OR_DEPARTMENT_OTHER)
Admission: RE | Admit: 2018-04-16 | Discharge: 2018-04-16 | Disposition: A | Discharge status: Home / Self Care | Attending: Urology | Admitting: Urology

## 2018-04-16 ENCOUNTER — Other Ambulatory Visit: Payer: Self-pay

## 2018-04-16 ENCOUNTER — Encounter (HOSPITAL_BASED_OUTPATIENT_CLINIC_OR_DEPARTMENT_OTHER)
Admission: RE | Disposition: A | Discharge status: Home / Self Care | Payer: Self-pay | Source: Home / Self Care | Attending: Urology

## 2018-04-16 ENCOUNTER — Ambulatory Visit (HOSPITAL_BASED_OUTPATIENT_CLINIC_OR_DEPARTMENT_OTHER): Admitting: Certified Registered"

## 2018-04-16 ENCOUNTER — Encounter (HOSPITAL_BASED_OUTPATIENT_CLINIC_OR_DEPARTMENT_OTHER): Payer: Self-pay | Admitting: Certified Registered"

## 2018-04-16 DIAGNOSIS — J449 Chronic obstructive pulmonary disease, unspecified: Secondary | ICD-10-CM | POA: Insufficient documentation

## 2018-04-16 DIAGNOSIS — Z85038 Personal history of other malignant neoplasm of large intestine: Secondary | ICD-10-CM | POA: Insufficient documentation

## 2018-04-16 DIAGNOSIS — I129 Hypertensive chronic kidney disease with stage 1 through stage 4 chronic kidney disease, or unspecified chronic kidney disease: Secondary | ICD-10-CM | POA: Insufficient documentation

## 2018-04-16 DIAGNOSIS — Z9049 Acquired absence of other specified parts of digestive tract: Secondary | ICD-10-CM | POA: Diagnosis not present

## 2018-04-16 DIAGNOSIS — Z9221 Personal history of antineoplastic chemotherapy: Secondary | ICD-10-CM | POA: Diagnosis not present

## 2018-04-16 DIAGNOSIS — N183 Chronic kidney disease, stage 3 (moderate): Secondary | ICD-10-CM | POA: Insufficient documentation

## 2018-04-16 DIAGNOSIS — Z87891 Personal history of nicotine dependence: Secondary | ICD-10-CM | POA: Insufficient documentation

## 2018-04-16 DIAGNOSIS — F0390 Unspecified dementia without behavioral disturbance: Secondary | ICD-10-CM | POA: Diagnosis not present

## 2018-04-16 DIAGNOSIS — N131 Hydronephrosis with ureteral stricture, not elsewhere classified: Secondary | ICD-10-CM | POA: Insufficient documentation

## 2018-04-16 DIAGNOSIS — I739 Peripheral vascular disease, unspecified: Secondary | ICD-10-CM | POA: Insufficient documentation

## 2018-04-16 HISTORY — PX: CYSTOSCOPY W/ URETERAL STENT PLACEMENT: SHX1429

## 2018-04-16 HISTORY — DX: Unspecified cataract: H26.9

## 2018-04-16 HISTORY — DX: Constipation, unspecified: K59.00

## 2018-04-16 HISTORY — DX: Anemia, unspecified: D64.9

## 2018-04-16 HISTORY — DX: Other specified noninflammatory disorders of uterus: N85.8

## 2018-04-16 HISTORY — DX: Presence of spectacles and contact lenses: Z97.3

## 2018-04-16 LAB — POCT I-STAT 4, (NA,K, GLUC, HGB,HCT)
GLUCOSE: 92 mg/dL (ref 70–99)
HCT: 21 % — ABNORMAL LOW (ref 36.0–46.0)
Hemoglobin: 7.1 g/dL — ABNORMAL LOW (ref 12.0–15.0)
Potassium: 5.7 mmol/L — ABNORMAL HIGH (ref 3.5–5.1)
Sodium: 133 mmol/L — ABNORMAL LOW (ref 135–145)

## 2018-04-16 SURGERY — CYSTOSCOPY, WITH RETROGRADE PYELOGRAM AND URETERAL STENT INSERTION
Anesthesia: General | Site: Ureter | Laterality: Bilateral

## 2018-04-16 MED ORDER — FENTANYL CITRATE (PF) 100 MCG/2ML IJ SOLN
INTRAMUSCULAR | Status: DC | PRN
  Administered 2018-04-16: 25 ug via INTRAVENOUS
  Administered 2018-04-16: 50 ug via INTRAVENOUS

## 2018-04-16 MED ORDER — CEFAZOLIN SODIUM-DEXTROSE 2-4 GM/100ML-% IV SOLN
INTRAVENOUS | Status: AC
  Filled 2018-04-16: qty 100

## 2018-04-16 MED ORDER — IOPAMIDOL (ISOVUE-370) INJECTION 76%
INTRAVENOUS | Status: DC | PRN
  Administered 2018-04-16: 20 mL

## 2018-04-16 MED ORDER — DEXAMETHASONE SODIUM PHOSPHATE 10 MG/ML IJ SOLN
INTRAMUSCULAR | Status: AC
  Filled 2018-04-16: qty 1

## 2018-04-16 MED ORDER — SODIUM CHLORIDE 0.9 % IV SOLN
INTRAVENOUS | Status: DC
  Administered 2018-04-16: 50 mL/h via INTRAVENOUS
  Filled 2018-04-16: qty 1000

## 2018-04-16 MED ORDER — PROPOFOL 10 MG/ML IV BOLUS
INTRAVENOUS | Status: AC
  Filled 2018-04-16: qty 20

## 2018-04-16 MED ORDER — DEXAMETHASONE SODIUM PHOSPHATE 10 MG/ML IJ SOLN
INTRAMUSCULAR | Status: DC | PRN
  Administered 2018-04-16: 10 mg via INTRAVENOUS

## 2018-04-16 MED ORDER — HYDROMORPHONE HCL 1 MG/ML IJ SOLN
0.2500 mg | INTRAMUSCULAR | Status: DC | PRN
  Filled 2018-04-16: qty 0.5

## 2018-04-16 MED ORDER — ONDANSETRON HCL 4 MG/2ML IJ SOLN
INTRAMUSCULAR | Status: DC | PRN
  Administered 2018-04-16: 4 mg via INTRAVENOUS

## 2018-04-16 MED ORDER — PROPOFOL 10 MG/ML IV BOLUS
INTRAVENOUS | Status: DC | PRN
  Administered 2018-04-16: 150 mg via INTRAVENOUS

## 2018-04-16 MED ORDER — URIBEL 118 MG PO CAPS: 1.0000 | ORAL_CAPSULE | Freq: Two times a day (BID) | ORAL | 3 refills | Status: AC | PRN

## 2018-04-16 MED ORDER — PROMETHAZINE HCL 25 MG/ML IJ SOLN
6.2500 mg | INTRAMUSCULAR | Status: DC | PRN
  Filled 2018-04-16: qty 1

## 2018-04-16 MED ORDER — ONDANSETRON HCL 4 MG/2ML IJ SOLN
INTRAMUSCULAR | Status: AC
  Filled 2018-04-16: qty 2

## 2018-04-16 MED ORDER — LIDOCAINE 2% (20 MG/ML) 5 ML SYRINGE
INTRAMUSCULAR | Status: DC | PRN
  Administered 2018-04-16: 60 mg via INTRAVENOUS

## 2018-04-16 MED ORDER — SODIUM CHLORIDE 0.9 % IR SOLN
Status: DC | PRN
  Administered 2018-04-16: 1 via INTRAVESICAL

## 2018-04-16 MED ORDER — FENTANYL CITRATE (PF) 100 MCG/2ML IJ SOLN
INTRAMUSCULAR | Status: AC
  Filled 2018-04-16: qty 2

## 2018-04-16 MED ORDER — CEFAZOLIN SODIUM-DEXTROSE 2-3 GM-%(50ML) IV SOLR
INTRAVENOUS | Status: DC | PRN
  Administered 2018-04-16: 2 g via INTRAVENOUS

## 2018-04-16 MED FILL — VILAMIT MB 118 MG CAPS: 118 | 15 days supply | Qty: 30 | Fill #0

## 2018-04-16 SURGICAL SUPPLY — 18 items
BAG DRAIN URO-CYSTO SKYTR STRL (DRAIN) ×6 IMPLANT
CATH INTERMIT  6FR 70CM (CATHETERS) IMPLANT
CLOTH BEACON ORANGE TIMEOUT ST (SAFETY) ×3 IMPLANT
GLOVE BIO SURGEON STRL SZ8 (GLOVE) ×3 IMPLANT
GOWN STRL REUS W/TWL XL LVL3 (GOWN DISPOSABLE) ×3 IMPLANT
GUIDEWIRE ANG ZIPWIRE 038X150 (WIRE) ×3 IMPLANT
GUIDEWIRE STR DUAL SENSOR (WIRE) ×3 IMPLANT
INFUSOR MANOMETER BAG 3000ML (MISCELLANEOUS) ×3 IMPLANT
IV NS IRRIG 3000ML ARTHROMATIC (IV SOLUTION) ×3 IMPLANT
KIT TURNOVER CYSTO (KITS) ×3 IMPLANT
MANIFOLD NEPTUNE II (INSTRUMENTS) ×3 IMPLANT
NS IRRIG 500ML POUR BTL (IV SOLUTION) ×3 IMPLANT
PACK CYSTO (CUSTOM PROCEDURE TRAY) ×3 IMPLANT
STENT CONTOUR 8FR X 24 (STENTS) ×6 IMPLANT
SYR 10ML LL (SYRINGE) ×3 IMPLANT
TUBE CONNECTING 12'X1/4 (SUCTIONS) ×1
TUBE CONNECTING 12X1/4 (SUCTIONS) ×2 IMPLANT
TUBING UROLOGY SET (TUBING) IMPLANT

## 2018-04-16 NOTE — Anesthesia Procedure Notes (Signed)
Procedure Name: LMA Insertion Date/Time: 04/16/2018 12:21 PM Performed by: Suan Halter, CRNA Pre-anesthesia Checklist: Patient identified, Emergency Drugs available, Suction available and Patient being monitored Patient Re-evaluated:Patient Re-evaluated prior to induction Oxygen Delivery Method: Circle system utilized Preoxygenation: Pre-oxygenation with 100% oxygen Induction Type: IV induction Ventilation: Mask ventilation without difficulty LMA: LMA inserted LMA Size: 3.0 Number of attempts: 1 Airway Equipment and Method: Bite block Placement Confirmation: positive ETCO2 Tube secured with: Tape Dental Injury: Teeth and Oropharynx as per pre-operative assessment

## 2018-04-16 NOTE — Brief Op Note (Signed)
04/16/2018  12:56 PM  PATIENT:  Beth Wilkins  78 y.o. female  PRE-OPERATIVE DIAGNOSIS:  RIGHT HYDRONEPHROSIS  POST-OPERATIVE DIAGNOSIS:  RIGHT HYDRONEPHROSIS  PROCEDURE:  Procedure(s): CYSTOSCOPY WITH RETROGRADE BILATERAL PYELOGRAM/RIGHT STENT EXCHANGE, LEFT STENT PLACEMENT (Bilateral)  SURGEON:  Surgeon(s) and Role:    * Nahsir Venezia, [name], MD - Primary  PHYSICIAN ASSISTANT:   ASSISTANTS: none   ANESTHESIA:   general  EBL:  none   BLOOD ADMINISTERED:none  DRAINS: right 8x24 JJ ureteral stent   LOCAL MEDICATIONS USED:  NONE  SPECIMEN:  No Specimen  DISPOSITION OF SPECIMEN:  N/A  COUNTS:  YES  TOURNIQUET:  * No tourniquets in log *  DICTATION: .Note written in EPIC  PLAN OF CARE: Discharge to home after PACU  PATIENT DISPOSITION:  PACU - hemodynamically stable.   Delay start of Pharmacological VTE agent (>24hrs) due to surgical blood loss or risk of bleeding: not applicable

## 2018-04-16 NOTE — Transfer of Care (Signed)
Immediate Anesthesia Transfer of Care Note  Patient: Beth Wilkins  Procedure(s) Performed: Procedure(s) (LRB): CYSTOSCOPY WITH RETROGRADE BILATERAL PYELOGRAM/RIGHT STENT EXCHANGE, LEFT STENT PLACEMENT (Bilateral)  Patient Location: PACU  Anesthesia Type: General  Level of Consciousness: awake, oriented, sedated and patient cooperative  Airway & Oxygen Therapy: Patient Spontanous Breathing and Patient connected to face mask oxygen  Post-op Assessment: Report given to PACU RN and Post -op Vital signs reviewed and stable  Post vital signs: Reviewed and stable  Complications: No apparent anesthesia complications  Last Vitals:  Vitals Value Taken Time  BP 136/80 04/16/2018  1:05 PM  Temp    Pulse 84 04/16/2018  1:09 PM  Resp 13 04/16/2018  1:09 PM  SpO2 100 % 04/16/2018  1:09 PM  Vitals shown include unvalidated device data.  Last Pain:  Vitals:   04/16/18 1041  TempSrc: Axillary  PainSc: 0-No pain

## 2018-04-16 NOTE — H&P (Signed)
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 She has been undergoing right stent exchange without incident. On recent imaging she developed new left hydronephrosis. No new LUTS. No fevers/chills/sweats  Past Medical History:  Diagnosis Date  . Anemia   . Anxiety   . Aortic atherosclerosis ([place]) 03/15/2017   noted on PET   . Arthritis   . Bilateral renal cysts   . Cataract    History of bilateral  . Centrilobular emphysema ([place]) per 05-10-16 chest xray  . CKD (chronic kidney disease), stage III ([place])   . 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 Complication of anesthesia    can be combative due to dementia  . Constipation   . Fibrosis, uterus   . Full dentures   . Headache    occ stress related  . History of chemotherapy 2019   last chemotherapy infusion 09/29/2017  . History of gastroesophageal reflux (GERD)    changes diet, not currently having issues with this  . History of pelvic fracture 2015   Right side  . Hyperlipidemia    daughter unaware  . Hypertension   . Liver metastasis ([place])   . Moderate dementia without behavioral disturbance (HCC)    moderate to severe dementia per neurologist note (dr Krista Blue) in epic  . N&V (nausea and vomiting)   . Pulmonary nodules   . Right ovarian cyst   . SBO (small bowel obstruction) ([place]) 01/2015  . 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 UTI (urinary tract  infection) 09/26/2016   5 day antibiotic treatment  . Wears glasses    Past Surgical History:  Procedure Laterality Date  . CATARACT EXTRACTION W/ INTRAOCULAR LENS  IMPLANT, BILATERAL  2005 approx  . CYSTOSCOPY W/ URETERAL STENT PLACEMENT Right 12/03/2015   Procedure: CYSTOSCOPY WITH RETROGRADE PYELOGRAM/URETERAL STENT PLACEMENT;  Surgeon: Cleon Gustin, MD;  Location: [facility];  Service: Urology;  Laterality: Right;  . CYSTOSCOPY W/ URETERAL STENT PLACEMENT Right 02/15/2016   Procedure: CYSTOSCOPY WITH RETROGRADE PYELOGRAM/URETERAL STENT REPLACEMENT;  Surgeon: Cleon Gustin, MD;  Location: [facility];  Service: Urology;  Laterality: Right;  . CYSTOSCOPY W/ URETERAL STENT PLACEMENT Right 03/28/2016   Procedure: CYSTOSCOPY WITH RETROGRADE PYELOGRAM/URETERAL STENT EXCHANGE;  Surgeon: Cleon Gustin, MD;  Location: [facility];  Service: Urology;  Laterality: Right;  . CYSTOSCOPY W/ URETERAL STENT PLACEMENT Right 07/18/2016   Procedure: CYSTOSCOPY WITH RETROGRADE PYELOGRAM/ STENT EXCHANGE;  Surgeon: Cleon Gustin, MD;  Location: [facility];  Service: Urology;  Laterality: Right;  . CYSTOSCOPY W/ URETERAL STENT PLACEMENT Right 10/10/2016   Procedure: CYSTOSCOPY WITH RETROGRADE PYELOGRAM/URETERAL STENT REPLACEMENT;  Surgeon: Cleon Gustin, MD;  Location: [facility];  Service: Urology;  Laterality: Right;  . CYSTOSCOPY W/ URETERAL STENT PLACEMENT Right 04/10/2017   Procedure: CYSTOSCOPY WITH RETROGRADE PYELOGRAM/URETERAL STENT EXCHANGE;  Surgeon: Cleon Gustin, MD;  Location: [place];  Service: Urology;  Laterality: Right;  . CYSTOSCOPY W/ URETERAL STENT PLACEMENT Right 10/09/2017   Procedure: CYSTOSCOPY WITH RETROGRADE PYELOGRAM/URETERAL STENT PLACEMENT;  Surgeon: Cleon Gustin, MD;  Location: [facility];  Service: Urology;  Laterality: Right;  30 MINS  .  CYSTOSCOPY WITH RETROGRADE PYELOGRAM, URETEROSCOPY AND STENT PLACEMENT Right 09/21/2015   Procedure: CYSTOSCOPY WITH BIL RETROGRADE PYELOGRAM, URETEROSCOPY, RIGHT STENT PLACEMENT ;  Surgeon: Cleon Gustin, MD;  Location: [facility];  Service: Urology;  Laterality: Right;  . HEMICOLECTOMY Right 01/23/2015   Dr. Hassell Done  . IR FLUORO GUIDE PORT INSERTION RIGHT  03/24/2017  . IR US GUIDE VASC ACCESS RIGHT  03/24/2017  . LAPAROSCOPY N/A 01/23/2015   Procedure: DIAGNOSTIC LAPAROSCOPY, EXPLORATORY LAPAROTOMY  RIGHT HEMI-COLECTOMY FOR OBSTRUCTION OF RIGHT TERMINAL ILEUM;  Surgeon: [name], MD;  Location: [facility];  Service: General;  Laterality: N/A;  . PORTACATH PLACEMENT      Home Medications:  Current Facility-Administered Medications  Medication Dose Route Frequency Provider Last Rate Last Dose  . 0.9 %  sodium chloride infusion   Intravenous Continuous Josephine Igo, MD 50 mL/hr at 04/16/18 1110 50 mL/hr at 04/16/18 1110   Facility-Administered Medications Ordered in Other Encounters  Medication Dose Route Frequency Provider Last Rate Last Dose  . heparin lock flush 100 unit/mL  500 Units Intravenous Once [name], MD       Allergies:  Allergies  Allergen Reactions  . Namenda [Memantine Hcl] Other (See Comments)    Hallucinations and headaches  . Oxycodone Other (See Comments)    Hallucinations     Family History  Problem Relation Age of Onset  . Diabetes Mellitus II Father   . Cancer Mother 58       ovarian cancer   . Cervical cancer Other    Social History:  reports that she quit smoking about 3 years ago. Her smoking use included cigarettes. She has a 12.50 pack-year smoking history. She has never used smokeless tobacco. She reports previous alcohol use of about 3.0 standard drinks of alcohol per week. She reports that she does not use drugs.  Review of Systems  All other systems reviewed and are negative.   Physical Exam:  Vital signs in last 24  hours: Temp:  [97.4 F (36.3 C)] 97.4 F (36.3 C) (02/10 1041) Pulse Rate:  [93] 93 (02/10 1041) Resp:  [18] 18 (02/10 1041) BP: (146)/(40) 146/40 (02/10 1041) SpO2:  [100 %] 100 % (02/10 1041) Physical Exam  Constitutional: She is oriented to person, place, and time. She appears well-developed and well-nourished.  HENT:  Head: Normocephalic.  Right Ear: External ear normal.  Left Ear: External ear normal.  Eyes: Pupils are equal, round, and reactive to light. EOM are normal.  Neck: Normal range of motion. No thyromegaly present.  Cardiovascular: Normal rate and regular rhythm.  Respiratory: Effort normal. No respiratory distress.  GI: Soft. She exhibits no distension.  Musculoskeletal: Normal range of motion.        General: No edema.  Neurological: She is alert and oriented to person, place, and time.  Skin: Skin is warm and dry.  Psychiatric: She has a normal mood and affect. Her behavior is normal. Judgment and thought content normal.    Laboratory Data:  Results for orders placed or performed during the hospital encounter of 04/16/18 (from the past 24 hour(s))  I-STAT 4, (NA,K, GLUC, HGB,HCT)     Status: Abnormal   Collection Time:  04/16/18 11:17 AM  Result Value Ref Range   Sodium 133 (L) 135 - 145 mmol/L   Potassium 5.7 (H) 3.5 - 5.1 mmol/L   Glucose, Bld 92 70 - 99 mg/dL   HCT 21.0 (L) 36.0 - 46.0 %   Hemoglobin 7.1 (L) 12.0 - 15.0 g/dL   No results found for this or any previous visit (from the past 240 hour(s)). Creatinine: No results for input(s): CREATININE in the last 168 hours. Baseline Creatinine: unknwon  Impression/Assessment:  77yo with bilateral hydronephrosis  Plan:  The risks/benefits/alternatives to right stent exchange and left stent placement was explained tot he daughter and she understands and wishes to proceed with surgery  Beth Wilkins 04/16/2018, 12:03 PM

## 2018-04-16 NOTE — Anesthesia Postprocedure Evaluation (Signed)
Anesthesia Post Note  Patient: Beth Wilkins  Procedure(s) Performed: CYSTOSCOPY WITH RETROGRADE BILATERAL PYELOGRAM/RIGHT STENT EXCHANGE, LEFT STENT PLACEMENT (Bilateral Ureter)     Patient location during evaluation: PACU Anesthesia Type: General Level of consciousness: awake and alert Pain management: pain level controlled Vital Signs Assessment: post-procedure vital signs reviewed and stable Respiratory status: spontaneous breathing, nonlabored ventilation and respiratory function stable Cardiovascular status: blood pressure returned to baseline and stable Postop Assessment: no apparent nausea or vomiting Anesthetic complications: no    Last Vitals:  Vitals:   04/16/18 1345 04/16/18 1400  BP: 129/72 139/80  Pulse: 78 80  Resp: 10 10  Temp:  (!) 36.4 C  SpO2: 99% 100%    Last Pain:  Vitals:   04/16/18 1330  TempSrc:   PainSc: Asleep                 Lynda Rainwater

## 2018-04-16 NOTE — Discharge Instructions (Signed)
Post Anesthesia Home Care Instructions  Activity: Get plenty of rest for the remainder of the day. A responsible individual must stay with you for 24 hours following the procedure.  For the next 24 hours, DO NOT: -Drive a car -Paediatric nurse -Drink alcoholic beverages -Take any medication unless instructed by your physician -Make any legal decisions or sign important papers.  Meals: Start with liquid foods such as gelatin or soup. Progress to regular foods as tolerated. Avoid greasy, spicy, heavy foods. If nausea and/or vomiting occur, drink only clear liquids until the nausea and/or vomiting subsides. Call your physician if vomiting continues.  Special Instructions/Symptoms: Your throat may feel dry or sore from the anesthesia or the breathing tube placed in your throat during surgery. If this causes discomfort, gargle with warm salt water. The discomfort should disappear within 24 hours.  Ureteral Stent Implantation, Care After Refer to this sheet in the next few weeks. These instructions provide you with information about caring for yourself after your procedure. Your health care provider may also give you more specific instructions. Your treatment has been planned according to current medical practices, but problems sometimes occur. Call your health care provider if you have any problems or questions after your procedure. What can I expect after the procedure? After the procedure, it is common to have:  Nausea.  Mild pain when you urinate. You may feel this pain in your lower back or lower abdomen. Pain should stop within a few minutes after you urinate. This may last for up to 1 week.  A small amount of blood in your urine for several days. Follow these instructions at home:  Medicines  Take over-the-counter and prescription medicines only as told by your health care provider.  If you were prescribed an antibiotic medicine, take it as told by your health care provider. Do  not stop taking the antibiotic even if you start to feel better.  Do not drive for 24 hours if you received a sedative.  Do not drive or operate heavy machinery while taking prescription pain medicines. Activity  Return to your normal activities as told by your health care provider. Ask your health care provider what activities are safe for you.  Do not lift anything that is heavier than 10 lb (4.5 kg). Follow this limit for 1 week after your procedure, or for as long as told by your health care provider. General instructions  Watch for any blood in your urine. Call your health care provider if the amount of blood in your urine increases.  If you have a catheter: ? Follow instructions from your health care provider about taking care of your catheter and collection bag. ? Do not take baths, swim, or use a hot tub until your health care provider approves.  Drink enough fluid to keep your urine clear or pale yellow.  Keep all follow-up visits as told by your health care provider. This is important. Contact a health care provider if:  You have pain that gets worse or does not get better with medicine, especially pain when you urinate.  You have difficulty urinating.  You feel nauseous or you vomit repeatedly during a period of more than 2 days after the procedure. Get help right away if:  Your urine is dark red or has blood clots in it.  You are leaking urine (have incontinence).  The end of the stent comes out of your urethra.  You cannot urinate.  You have sudden, sharp, or severe pain in  your abdomen or lower back.  You have a fever. This information is not intended to replace advice given to you by your health care provider. Make sure you discuss any questions you have with your health care provider. Document Released: 10/24/2012 Document Revised: 07/30/2015 Document Reviewed: 09/05/2014 Elsevier Interactive Patient Education  2019 [employer].

## 2018-04-16 NOTE — Anesthesia Preprocedure Evaluation (Addendum)
Anesthesia Evaluation  Patient identified by MRN, date of birth, ID band Patient awake    Reviewed: Allergy & Precautions, NPO status , Patient's Chart, lab work & pertinent test results  Airway Mallampati: II  TM Distance: >3 FB Neck ROM: Full    Dental no notable dental hx. (+) Teeth Intact   Pulmonary COPD, former smoker,    breath sounds clear to auscultation       Cardiovascular hypertension, Pt. on medications + Peripheral Vascular Disease   Rhythm:Regular Rate:Normal     Neuro/Psych  Headaches, PSYCHIATRIC DISORDERS Anxiety Dementia Dementia    GI/Hepatic GERD  ,  Endo/Other    Renal/GU Renal InsufficiencyRenal disease     Musculoskeletal  (+) Arthritis ,   Abdominal Normal abdominal exam  (+)   Peds  Hematology  (+) anemia ,   Anesthesia Other Findings Severe dementia Oriented to self only   Reproductive/Obstetrics                            Anesthesia Physical  Anesthesia Plan  ASA: IV  Anesthesia Plan: General   Post-op Pain Management:    Induction: Intravenous  PONV Risk Score and Plan: 3 and Ondansetron, Dexamethasone, Midazolam and Treatment may vary due to age or medical condition  Airway Management Planned: LMA  Additional Equipment:   Intra-op Plan:   Post-operative Plan: Extubation in OR  Informed Consent: I have reviewed the patients History and Physical, chart, labs and discussed the procedure including the risks, benefits and alternatives for the proposed anesthesia with the patient or authorized representative who has indicated his/her understanding and acceptance.     Dental advisory given  Plan Discussed with: CRNA and Surgeon  Anesthesia Plan Comments:        Anesthesia Quick Evaluation

## 2018-04-17 ENCOUNTER — Encounter (HOSPITAL_BASED_OUTPATIENT_CLINIC_OR_DEPARTMENT_OTHER): Payer: Self-pay | Admitting: Urology

## 2018-04-17 ENCOUNTER — Other Ambulatory Visit (HOSPITAL_COMMUNITY): Payer: Self-pay | Admitting: Urology

## 2018-04-17 DIAGNOSIS — N135 Crossing vessel and stricture of ureter without hydronephrosis: Secondary | ICD-10-CM

## 2018-04-17 NOTE — Op Note (Signed)
Preoperative diagnosis: bilateral ureteral obstruction  Postoperative diagnosis: Same  Procedure: 1 cystoscopy 2. Bilateral retrograde pyelography 3.  Intraoperative fluoroscopy, under one hour, with interpretation 4.  Right 8 x 24 JJ stent exchange  Attending: Rosie Fate  Anesthesia: General  Estimated blood loss: None  Drains: right 8 x 26 JJ ureteral stent without tether  Specimens: none  Antibiotics: ancef  Findings: mild right hydronephrosis. Occlusion on the left mid ureter and we were unable to advance wire past the obstruction. Ureteral orifices in normal anatomic location.  Indications: Patient is a 78 year old female with a history of bilateral ureteral obstruction and elevated creatinine. After discussing treatment options, they decided proceed with right stent exchange and left stent placement.  Procedure her in detail: The patient was brought to the operating room and a brief timeout was done to ensure correct patient, correct procedure, correct site.  General anesthesia was administered patient was placed in dorsal lithotomy position.  Her genitalia was then prepped and draped in usual sterile fashion.  A rigid 71 French cystoscope was passed in the urethra and the bladder.  Bladder was inspected free masses or lesions.  the ureteral orifices were in the normal orthotopic locations. a 6 french ureteral catheter was then instilled into the left ureteral orifice.  a gentle retrograde was obtained and findings noted above. We then advanced a zipwire up to the mid ureter. We were unable to advance a wire past the obstruction. We then attempted to advance a sensor wire past the obstruction which was unsuccessful.  We then turned out attention to the right side. Using a grasper the stent was brought to the urethral meatus.  We then advanced a sensor wire up to the renal pelvis. We then removed the stent and then cannulated the right ureter with a 6 french catheter. a gentle  retrograde was obtained and findings noted above. we then placed a 8 x 24 double-j ureteral stent over the sensor wire.  We then removed the wire and good coil was noted in the the renal pelvis under fluoroscopy and the bladder under direct vision.  the bladder was then drained and this concluded the procedure which was well tolerated by patient.  Complications: None  Condition: Stable, extubated, transferred to PACU  Plan: Patient is to be discharged home. She is to be scheduled for left nephrostomy tube placement

## 2018-04-18 MED FILL — MIRTAZAPINE 15 MG TABLET: 15 | 30 days supply | Qty: 30 | Fill #1

## 2018-04-18 MED FILL — QUETIAPINE 25 MG TABLET: 25 | 30 days supply | Qty: 30 | Fill #1

## 2018-04-24 ENCOUNTER — Other Ambulatory Visit: Payer: Self-pay | Admitting: Radiology

## 2018-04-25 ENCOUNTER — Ambulatory Visit (HOSPITAL_COMMUNITY)
Admission: RE | Admit: 2018-04-25 | Discharge: 2018-04-25 | Disposition: A | Discharge status: Home / Self Care | Source: Ambulatory Visit | Attending: Urology | Admitting: Urology

## 2018-04-25 ENCOUNTER — Encounter (HOSPITAL_COMMUNITY): Payer: Self-pay

## 2018-04-25 DIAGNOSIS — H269 Unspecified cataract: Secondary | ICD-10-CM | POA: Insufficient documentation

## 2018-04-25 DIAGNOSIS — N183 Chronic kidney disease, stage 3 (moderate): Secondary | ICD-10-CM | POA: Diagnosis not present

## 2018-04-25 DIAGNOSIS — Z79899 Other long term (current) drug therapy: Secondary | ICD-10-CM | POA: Diagnosis not present

## 2018-04-25 DIAGNOSIS — I129 Hypertensive chronic kidney disease with stage 1 through stage 4 chronic kidney disease, or unspecified chronic kidney disease: Secondary | ICD-10-CM | POA: Diagnosis not present

## 2018-04-25 DIAGNOSIS — E785 Hyperlipidemia, unspecified: Secondary | ICD-10-CM | POA: Insufficient documentation

## 2018-04-25 DIAGNOSIS — C189 Malignant neoplasm of colon, unspecified: Secondary | ICD-10-CM | POA: Diagnosis not present

## 2018-04-25 DIAGNOSIS — Z436 Encounter for attention to other artificial openings of urinary tract: Secondary | ICD-10-CM | POA: Insufficient documentation

## 2018-04-25 DIAGNOSIS — Z7982 Long term (current) use of aspirin: Secondary | ICD-10-CM | POA: Insufficient documentation

## 2018-04-25 DIAGNOSIS — M1990 Unspecified osteoarthritis, unspecified site: Secondary | ICD-10-CM | POA: Insufficient documentation

## 2018-04-25 DIAGNOSIS — N135 Crossing vessel and stricture of ureter without hydronephrosis: Secondary | ICD-10-CM | POA: Insufficient documentation

## 2018-04-25 DIAGNOSIS — Z885 Allergy status to narcotic agent status: Secondary | ICD-10-CM | POA: Diagnosis not present

## 2018-04-25 HISTORY — PX: IR NEPHROSTOMY PLACEMENT LEFT: IMG6063

## 2018-04-25 LAB — CBC
HCT: 24.7 % — ABNORMAL LOW (ref 36.0–46.0)
Hemoglobin: 7.7 g/dL — ABNORMAL LOW (ref 12.0–15.0)
MCH: 29.5 pg (ref 26.0–34.0)
MCHC: 31.2 g/dL (ref 30.0–36.0)
MCV: 94.6 fL (ref 80.0–100.0)
NRBC: 0 % (ref 0.0–0.2)
Platelets: 293 10*3/uL (ref 150–400)
RBC: 2.61 MIL/uL — AB (ref 3.87–5.11)
RDW: 16.2 % — ABNORMAL HIGH (ref 11.5–15.5)
WBC: 8.3 10*3/uL (ref 4.0–10.5)

## 2018-04-25 LAB — BASIC METABOLIC PANEL
Anion gap: 11 (ref 5–15)
BUN: 119 mg/dL — ABNORMAL HIGH (ref 8–23)
CO2: 18 mmol/L — AB (ref 22–32)
Calcium: 8.6 mg/dL — ABNORMAL LOW (ref 8.9–10.3)
Chloride: 108 mmol/L (ref 98–111)
Creatinine, Ser: 8.39 mg/dL — ABNORMAL HIGH (ref 0.44–1.00)
GFR calc Af Amer: 5 mL/min — ABNORMAL LOW (ref >60)
GFR calc non Af Amer: 4 mL/min — ABNORMAL LOW (ref >60)
Glucose, Bld: 103 mg/dL — ABNORMAL HIGH (ref 70–99)
Potassium: 5.4 mmol/L — ABNORMAL HIGH (ref 3.5–5.1)
Sodium: 137 mmol/L (ref 135–145)

## 2018-04-25 LAB — PROTIME-INR
INR: 1.13
Prothrombin Time: 14.4 seconds (ref 11.4–15.2)

## 2018-04-25 MED ORDER — CEFAZOLIN SODIUM-DEXTROSE 2-4 GM/100ML-% IV SOLN
INTRAVENOUS | Status: AC
  Filled 2018-04-25: qty 100

## 2018-04-25 MED ORDER — FENTANYL CITRATE (PF) 100 MCG/2ML IJ SOLN
INTRAMUSCULAR | Status: AC | PRN
  Administered 2018-04-25 (×2): 50 ug via INTRAVENOUS

## 2018-04-25 MED ORDER — IOPAMIDOL (ISOVUE-370) INJECTION 76%: 100.0000 mL | Freq: Once | INTRAVENOUS | Status: DC | PRN

## 2018-04-25 MED ORDER — FENTANYL CITRATE (PF) 100 MCG/2ML IJ SOLN
INTRAMUSCULAR | Status: AC
  Filled 2018-04-25: qty 2

## 2018-04-25 MED ORDER — MIDAZOLAM HCL 2 MG/2ML IJ SOLN
INTRAMUSCULAR | Status: AC | PRN
  Administered 2018-04-25 (×3): 1 mg via INTRAVENOUS

## 2018-04-25 MED ORDER — CIPROFLOXACIN IN D5W 400 MG/200ML IV SOLN
400.0000 mg | INTRAVENOUS | Status: AC
  Administered 2018-04-25: 400 mg via INTRAVENOUS

## 2018-04-25 MED ORDER — LIDOCAINE HCL 1 % IJ SOLN
INTRAMUSCULAR | Status: AC
  Filled 2018-04-25: qty 20

## 2018-04-25 MED ORDER — MIDAZOLAM HCL 2 MG/2ML IJ SOLN
INTRAMUSCULAR | Status: AC
  Filled 2018-04-25: qty 4

## 2018-04-25 MED ORDER — CIPROFLOXACIN IN D5W 400 MG/200ML IV SOLN
INTRAVENOUS | Status: AC
  Administered 2018-04-25: 400 mg via INTRAVENOUS
  Filled 2018-04-25: qty 200

## 2018-04-25 MED ORDER — IOPAMIDOL (ISOVUE-300) INJECTION 61%
INTRAVENOUS | Status: AC
  Administered 2018-04-25: 20 mL
  Filled 2018-04-25: qty 50

## 2018-04-25 MED ORDER — SODIUM CHLORIDE 0.9 % IV SOLN
INTRAVENOUS | Status: DC
  Administered 2018-04-25: 11:00:00 via INTRAVENOUS

## 2018-04-25 MED ORDER — HEPARIN SOD (PORK) LOCK FLUSH 100 UNIT/ML IV SOLN
500.0000 [IU] | Freq: Once | INTRAVENOUS | Status: AC
  Administered 2018-04-25: 500 [IU] via INTRAVENOUS
  Filled 2018-04-25: qty 5

## 2018-04-25 NOTE — H&P (Signed)
Referring Physician(s): McKenzie,Patrick L  Supervising Physician: Markus Daft  Patient Status:  [facility]  Chief Complaint: Colon cancer, bilateral ureteral obstruction, chronic kidney disease   Subjective: Patient familiar to IR service from prior Port-A-Cath on 03/24/2017.  She has a history of metastatic colon cancer with bilateral ureteral obstruction/hydroureteronephrosis and rising creatinine levels.  She currently has a right ureteral stent in place.  Recent cystoscopy revealed an occluded left mid ureter and left ureteral stent could not be placed.  She presents today for left percutaneous nephrostomy.  Per family the patient has had no fever, headache, chest pain, dyspnea, cough, abd pain, nausea, or vomiting.  She does have some intermittent back pain and occasional hematuria.  Additional medical history as below.  Past Medical History:  Diagnosis Date  . Anemia   . Anxiety   . Aortic atherosclerosis ([place]) 03/15/2017   noted on PET   . Arthritis   . Bilateral renal cysts   . Cataract    History of bilateral  . Centrilobular emphysema ([place]) per 05-10-16 chest xray  . CKD (chronic kidney disease), stage III ([place])   . 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 Complication of anesthesia    can be combative due to dementia  . Constipation   . Fibrosis, uterus   . Full dentures   . Headache    occ stress related  . History of chemotherapy 2019   last chemotherapy infusion 09/29/2017  . History of gastroesophageal reflux (GERD)    changes diet, not currently having issues with this  . History of pelvic fracture 2015   Right side  . Hyperlipidemia    daughter unaware  . Hypertension   . Liver metastasis ([place])   . Moderate dementia without behavioral disturbance ([facility])    moderate to severe dementia per neurologist note (dr Krista Blue) in epic  . N&V (nausea and vomiting)   . Pulmonary nodules   . Right ovarian cyst   . SBO (small bowel obstruction) ([place]) 01/2015  . 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 UTI (urinary tract infection) 09/26/2016   5 day antibiotic treatment  . Wears glasses    Past Surgical History:  Procedure Laterality Date  . CATARACT EXTRACTION W/ INTRAOCULAR LENS  IMPLANT, BILATERAL  2005 approx  . CYSTOSCOPY W/ URETERAL STENT PLACEMENT Right 12/03/2015   Procedure: CYSTOSCOPY WITH RETROGRADE PYELOGRAM/URETERAL STENT PLACEMENT;  Surgeon: Cleon Gustin, MD;  Location: [facility];  Service: Urology;  Laterality: Right;  . CYSTOSCOPY W/ URETERAL STENT PLACEMENT Right 02/15/2016   Procedure: CYSTOSCOPY WITH RETROGRADE PYELOGRAM/URETERAL STENT REPLACEMENT;  Surgeon: Cleon Gustin, MD;  Location: [facility];  Service: Urology;  Laterality: Right;  . CYSTOSCOPY W/ URETERAL STENT PLACEMENT Right 03/28/2016   Procedure: CYSTOSCOPY WITH RETROGRADE PYELOGRAM/URETERAL STENT EXCHANGE;  Surgeon: Cleon Gustin, MD;  Location: [facility];  Service: Urology;  Laterality: Right;  . CYSTOSCOPY W/ URETERAL STENT PLACEMENT Right 07/18/2016   Procedure: CYSTOSCOPY WITH RETROGRADE PYELOGRAM/ STENT EXCHANGE;  Surgeon: Cleon Gustin, MD;  Location: [place];  Service: Urology;  Laterality: Right;  . CYSTOSCOPY W/ URETERAL STENT PLACEMENT Right 10/10/2016   Procedure: CYSTOSCOPY WITH RETROGRADE PYELOGRAM/URETERAL STENT REPLACEMENT;  Surgeon: Cleon Gustin, MD;  Location: [facility];  Service: Urology;  Laterality: Right;  . CYSTOSCOPY W/  URETERAL STENT PLACEMENT Right 04/10/2017   Procedure: CYSTOSCOPY WITH RETROGRADE PYELOGRAM/URETERAL STENT EXCHANGE;  Surgeon: Cleon Gustin, MD;  Location: [facility];  Service: Urology;  Laterality: Right;  . CYSTOSCOPY W/ URETERAL STENT PLACEMENT Right 10/09/2017   Procedure: CYSTOSCOPY WITH RETROGRADE PYELOGRAM/URETERAL STENT PLACEMENT;  Surgeon: Cleon Gustin, MD;  Location: [facility];  Service: Urology;  Laterality: Right;  30 MINS  . CYSTOSCOPY W/ URETERAL STENT PLACEMENT Bilateral 04/16/2018   Procedure: CYSTOSCOPY WITH RETROGRADE BILATERAL PYELOGRAM/RIGHT STENT EXCHANGE, LEFT STENT PLACEMENT;  Surgeon: Cleon Gustin, MD;  Location: [facility];  Service: Urology;  Laterality: Bilateral;  . CYSTOSCOPY WITH RETROGRADE PYELOGRAM, URETEROSCOPY AND STENT PLACEMENT Right 09/21/2015   Procedure: CYSTOSCOPY WITH BIL RETROGRADE PYELOGRAM, URETEROSCOPY, RIGHT STENT PLACEMENT ;  Surgeon: Cleon Gustin, MD;  Location: [facility];  Service: Urology;  Laterality: Right;  . HEMICOLECTOMY Right 01/23/2015   Dr. Hassell Done  . IR FLUORO GUIDE PORT INSERTION RIGHT  03/24/2017  . IR US GUIDE VASC ACCESS RIGHT  03/24/2017  . LAPAROSCOPY N/A 01/23/2015   Procedure: DIAGNOSTIC LAPAROSCOPY, EXPLORATORY LAPAROTOMY  RIGHT HEMI-COLECTOMY FOR OBSTRUCTION OF RIGHT TERMINAL ILEUM;  Surgeon: [name], MD;  Location: [facility];  Service: General;  Laterality: N/A;  . PORTACATH PLACEMENT        Allergies: Namenda [memantine hcl] and Oxycodone  Medications: Prior to Admission medications   Medication Sig Start Date End Date Taking? Authorizing Provider  amLODipine (NORVASC) 5 MG tablet Take 5 mg by mouth every morning.    Yes [provider]  aspirin 81 MG tablet Take 81 mg by mouth daily.    Yes [provider]  Cholecalciferol (VITAMIN D3) 2000 UNITS TABS Take by mouth daily.   Yes [provider]  Coenzyme  Q10 (CO Q 10) 100 MG CAPS Take by mouth daily.   Yes [provider]  medium chain triglycerides (MCT OIL) oil Take 5 mLs by mouth 2 (two) times daily.  08/18/17  Yes [provider]  Melatonin 5 MG TABS Take 5-15 mg by mouth at bedtime as needed.   Yes [provider]  mirabegron ER (MYRBETRIQ) 50 MG TB24 tablet Take 1 tablet (50 mg total) by mouth daily. Patient taking differently: Take 50 mg by mouth daily. Mid morning 04/10/17  Yes McKenzie, [name], MD  mirtazapine (REMERON) 15 MG tablet Take 1 tablet (15 mg total) by mouth at bedtime. 03/02/18  Yes Cameron Sprang, MD  Multiple Vitamins-Minerals (WOMENS MULTIVITAMIN PO) Take 1 tablet by mouth daily. With iron   Yes [provider]  polyethylene glycol (MIRALAX / GLYCOLAX) packet Take 17 g by mouth daily. Patient taking differently: Take 17 g by mouth daily as needed.  01/30/15  Yes Verlee Monte, MD  QUEtiapine (SEROQUEL) 25 MG tablet Take 1 tablet each night Patient taking differently: 25 mg. Take 1 tablet each night 03/02/18  Yes Cameron Sprang, MD  traMADol (ULTRAM) 50 MG tablet Take 50 mg by mouth every 6 (six) hours as needed.   Yes [provider]  UNABLE TO FIND Med Name: Closys Mouth Rinse two-three times daily for ulcer.   Yes  [provider]  vitamin B-12 (CYANOCOBALAMIN) 1000 MCG tablet Take 1,000 mcg by mouth daily.   Yes [provider]  lidocaine-prilocaine (EMLA) cream Apply 1 application topically as needed. 03/31/17   [name], MD  Meth-Hyo-M Barnett Hatter Phos-Ph Sal (URIBEL) 118 MG CAPS Take 1 capsule (118 mg total) by mouth 2 (two) times daily as needed. 04/16/18   McKenzie, [name], MD  prochlorperazine (COMPAZINE) 5 MG tablet Take 1 tablet (5 mg total) by mouth every 6 (six) hours as needed for nausea or vomiting. 02/09/18   [name], MD     Vital Signs: BP (!) 119/108   Pulse 89   Temp (!) 97.1 F (36.2 C) (Axillary)   Resp 16   Ht 5\' 5"  (1.651 m)   Wt 112  lb (50.8 kg)   SpO2 100%   BMI 18.64 kg/m   Physical Exam patient awake, pleasantly confused, history of dementia noted.  Family in room ;chest clear to auscultation bilaterally.  Heart with regular rate and rhythm.  Abdomen soft, bowel sounds, nontender.  No lower extremity edema.  Imaging: No results found.  Labs:  CBC: Recent Labs    02/09/18 1008 02/14/18 0949 02/23/18 0742 04/16/18 1117 04/25/18 1026  WBC 6.5 7.8 9.2  --  8.3  HGB 8.8* 8.6* 8.2* 7.1* 7.7*  HCT 27.9* 27.0* 25.5* 21.0* 24.7*  PLT 281 266 323  --  293    COAGS: Recent Labs    04/25/18 1026  INR 1.13    BMP: Recent Labs    02/09/18 1008 02/14/18 0949 02/23/18 0742 04/16/18 1117 04/25/18 1026  NA 146* 143 141 133* 137  K 4.0 4.5 5.1 5.7* 5.4*  CL 111 109 109  --  108  CO2 22 25 20*  --  18*  GLUCOSE 88 90 91 92 103*  BUN 30* 40* 71*  --  119*  CALCIUM 8.7* 8.7* 8.8*  --  8.6*  CREATININE 2.82* 3.29* 5.94*  --  8.39*  GFRNONAA 16* 13* 6*  --  4*  GFRAA 18* 15* 7*  --  5*    LIVER FUNCTION TESTS: Recent Labs    01/19/18 1057 02/09/18 1008 02/14/18 0949 02/23/18 0742  BILITOT 0.3 0.4 0.4 0.4  AST 19 17 20 22   ALT 8 6 6 11   ALKPHOS 75 72 78 86  PROT 6.9 7.0 6.8 7.4  ALBUMIN 3.4* 3.5 3.2* 3.6    Assessment and Plan: Pt with history of metastatic colon cancer with bilateral ureteral obstruction/hydroureteronephrosis and rising creatinine levels (known CKD).  She currently has a right ureteral stent in place.  Recent cystoscopy revealed an occluded left mid ureter and left ureteral stent could not be placed.  Creatinine today 8.39 ; she presents today for left percutaneous nephrostomy. Risks and benefits of procedure were discussed with the patient/family including, but not limited to, infection, bleeding, significant bleeding causing loss or decrease in renal function or damage to adjacent structures.   All of the patient's questions were answered, patient is agreeable to  proceed.  Consent signed and in chart.      Electronically Signed: D. Rowe Robert, PA-C 04/25/2018, 11:18 AM   I spent a total of 30 minutes at the the patient's bedside AND on the patient's hospital floor or unit, greater than 50% of which was counseling/coordinating care for left percutaneous nephrostomy

## 2018-04-25 NOTE — Discharge Instructions (Signed)
Percutaneous Nephrolithotomy, Care After °This sheet gives you information about how to care for yourself after your procedure. Your health care provider may also give you more specific instructions. If you have problems or questions, contact your health care provider. °What can I expect after the procedure? °After the procedure, it is common to have: °· Soreness or pain. °· A small amount of blood or clear fluid coming from your incision for a few days. °· Fatigue. °· Some blood in your urine. This will last for a few days. °Follow these instructions at home: °Medicines °· Take over-the-counter and prescription medicines only as told by your health care provider. °· If you were prescribed an antibiotic medicine, take it as told by your health care provider. Do not stop using the antibiotic even if you start to feel better. °· Ask your health care provider if the medicine prescribed to you: °? Requires you to avoid driving or using heavy machinery. °? Can cause constipation. You may need to take these actions to prevent or treat constipation: °§ Drink enough fluid to keep your urine pale yellow. °§ Take over-the-counter or prescription medicines. °§ Eat foods that are high in fiber, such as beans, whole grains, and fresh fruits and vegetables. °§ Limit foods that are high in fat and processed sugars, such as fried or sweet foods. °Incision care ° °· Follow instructions from your health care provider about how to take care of your incision. Make sure you: °? Wash your hands with soap and water before and after you change your bandage (dressing). If soap and water are not available, use hand sanitizer. °? Change your dressing as told by your health care provider. °? Leave stitches (sutures), skin glue, or adhesive strips in place. These skin closures may need to stay in place for 2 weeks or longer. If adhesive strip edges start to loosen and curl up, you may trim the loose edges. Do not remove adhesive strips  completely unless your health care provider tells you to do that. °· Check your incision area every day for signs of infection. Check for: °? Redness, swelling, or pain. °? More fluid or blood. °? Warmth. °? Pus or a bad smell. °· Do not take baths, swim, or use a hot tub until your health care provider approves. Ask your health care provider if you may take showers. You may only be allowed to take sponge baths. °Activity °· Avoid strenuous activities for as long as told by your health care provider. °· Return to your normal activities as told by your health care provider. Ask your health care provider what activities are safe for you. °General instructions °· If you were sent home with a catheter or surgical drain (nephrostomy tube), follow your health care provider's instructions on how to take care of it. °· Wear compression stockings as told by your health care provider. These stockings help to prevent blood clots and reduce swelling in your legs. °· Do not use any products that contain nicotine or tobacco, such as cigarettes, e-cigarettes, and chewing tobacco. These can delay incision healing after surgery. If you need help quitting, ask your health care provider. °· Keep all follow-up visits as told by your health care provider. This is important. °? If you still have a stent, your health care provider will need to remove it after 1-2 weeks. °Contact a health care provider if: °· You have a fever. °· You have redness, swelling, or pain around your incision. °· You have more   fluid or blood coming from your incision.  Your incision feels warm to the touch.  You have pus or a bad smell coming from your incision.  You lose your appetite.  You feel nauseous or you vomit.  You have been sent home with a urinary catheter or a surgical drain and urine flow suddenly stops, followed by kidney pain. Get help right away if:  You notice a large amount of blood in your urine.  You have blood clots in your  urine.  You cannot urinate.  You have chest pain or trouble breathing. Summary  After the procedure, it is common to feel soreness and discomfort. You may also see blood in your urine.  You will be told how to care for yourself after the procedure. Follow instructions on how to care for your incision and how to recognize signs of infection.  Avoid activities that require great effort. Return to activities as told by your health care provider.  Get help right away if you have blood clots in your urine, you cannot urinate, or you have trouble breathing. This information is not intended to replace advice given to you by your health care provider. Make sure you discuss any questions you have with your health care provider. Document Released: 03/20/2015 Document Revised: 09/13/2017 Document Reviewed: 09/13/2017 Elsevier Interactive Patient Education  2019 [employer]. Moderate Conscious Sedation, Adult, Care After These instructions provide you with information about caring for yourself after your procedure. Your health care provider may also give you more specific instructions. Your treatment has been planned according to current medical practices, but problems sometimes occur. Call your health care provider if you have any problems or questions after your procedure. What can I expect after the procedure? After your procedure, it is common:  To feel sleepy for several hours.  To feel clumsy and have poor balance for several hours.  To have poor judgment for several hours.  To vomit if you eat too soon. Follow these instructions at home: For at least 24 hours after the procedure:   Do not: ? Participate in activities where you could fall or become injured. ? Drive. ? Use heavy machinery. ? Drink alcohol. ? Take sleeping pills or medicines that cause drowsiness. ? Make important decisions or sign legal documents. ? Take care of children on your own.  Rest. Eating and  drinking  Follow the diet recommended by your health care provider.  If you vomit: ? Drink water, juice, or soup when you can drink without vomiting. ? Make sure you have little or no nausea before eating solid foods. General instructions  Have a responsible adult stay with you until you are awake and alert.  Take over-the-counter and prescription medicines only as told by your health care provider.  If you smoke, do not smoke without supervision.  Keep all follow-up visits as told by your health care provider. This is important. Contact a health care provider if:  You keep feeling nauseous or you keep vomiting.  You feel light-headed.  You develop a rash.  You have a fever. Get help right away if:  You have trouble breathing. This information is not intended to replace advice given to you by your health care provider. Make sure you discuss any questions you have with your health care provider. Document Released: 12/12/2012 Document Revised: 07/27/2015 Document Reviewed: 06/13/2015 Elsevier Interactive Patient Education  2019 [employer].

## 2018-04-25 NOTE — Procedures (Signed)
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  10 Fr drain in mid/upper pole.  Complications: None     EBL: Minimal  Plan: Bedrest 2 hours.  Plan to discharge to home.   Will follow up with Urology.     Beth Wilkins R. Anselm Pancoast, MD  Pager: [phone]

## 2018-05-01 ENCOUNTER — Telehealth: Payer: Self-pay

## 2018-05-01 MED FILL — AMLODIPINE BESYLATE 5 MG TA: 5 | 30 days supply | Qty: 30 | Fill #5

## 2018-05-01 NOTE — Telephone Encounter (Signed)
Received call needing verbal approval to recert this patient under [facility]osp[facility] care, verbal order was given.

## 2018-05-09 IMAGING — CT NM PET TUM IMG RESTAG (PS) SKULL BASE T - THIGH
1 of 7 series · 1 of 25 positions shown · non-contrast
Comparison: PET-CT August 09, 2016

CLINICAL DATA: Subsequent treatment strategy for colon cancer.

EXAM:
NUCLEAR MEDICINE PET SKULL BASE TO THIGH
TECHNIQUE: 6.3 mCi F-18 FDG was injected intravenously. Full-ring PET imaging
was performed from the skull base to thigh after the radiotracer. CT
data was obtained and used for attenuation correction and anatomic
localization.
FASTING BLOOD GLUCOSE:  Value: 111 mg/dl

[Series 4: ct sk_thigh 5.0 b31f · axial · 5.0mm · 0.98mm/px · 1 of 219 slices shown]
[im 219/219  brain]
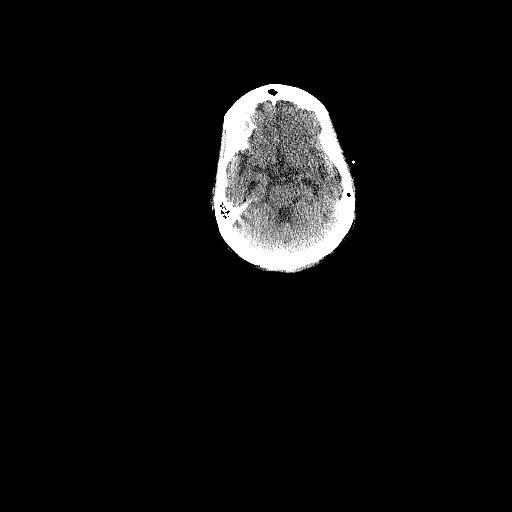

[1 of 25 positions shown; findings below may reference images not displayed]

FINDINGS: NECK: Brown fat in the posterior neck. No suspicious FDG avid
disease in the head or neck.

CHEST: Numerous pulmonary nodules again identified. Overall, there
has been mild worsening in the nodules. The representative nodule in
the right lung on series 8, image 31 measures 5 mm today versus 4 mm
previously. There is minimal uptake within this nodule with a
maximum SUV of 1 today, unchanged. The 3 mm nodule in the right lung
on image 37 is stable. A nodule adjacent to the right hemidiaphragm
on image 50 is larger. A few other tiny nodules such as laterally in
the left lung on image 33 are larger. No other FDG avid nodules in
the lungs however. No other abnormal uptake in the chest.

ABDOMEN/PELVIS: The right hepatic lobe nodule on series 4, image 90
measures 15 mm on both studies. The maximum SUV in this nodule is
9.7 today versus 6.4 previously. The left hepatic lobe lesion
demonstrates a maximum SUV of 7.7 today versus 5.7 previously. Very
subtle uptake in the liver on image 86 with no CT correlate is
nonspecific. Recommend attention on follow-up. No definitive new
hepatic metastases. The ventral nodule along the midline on image
147 demonstrates a maximum SUV of 8.2 today versus 5.1 previously.
This was described as along the [place] previously. This nodule
may be within the abdominal musculature. The nodule in the central
mesentery measures 13 mm on both studies seen on series 4, image 142
today with a maximum SUV of 5.2 today versus 3.6 previously. This
may be artifactually high and including adjacent uptake within
bowel.

The uptake at the ileocolic anastomosis is difficult to measure
given physiologic bowel uptake there has been no definitive change.
A metastasis along the abdominal wall on image 119 is slightly more
intense in the interval. The cystic mass in the right side of the
pelvis is stable on CT imaging. It is difficult to evaluate the
level of uptake given adjacent bowel uptake. However, it does appear
to be increased uptake along the posterior aspect of this mass.
Uptake in the right buttocks is likely inflammatory.

SKELETON: Uptake 1 the superior endplate of L4 is likely
degenerative. No convincing evidence of bony metastatic disease.
IMPRESSION: 1. Interval worsening in the interval. Numerous pulmonary nodules
are again seen. While multiple nodules are stable, there are several
nodules which are a little larger in the interval. They remain too
small to completely characterize with PET-CT. The 2 hepatic
metastases demonstrate increased FDG uptake on today's study
compared to the previous study. Abdominal wall and peritoneal
metastases are mildly worsened.

## 2018-05-15 MED FILL — MIRTAZAPINE 15 MG TABLET: 15 | 30 days supply | Qty: 30 | Fill #2

## 2018-05-15 MED FILL — QUETIAPINE 25 MG TABLET: 25 | 30 days supply | Qty: 30 | Fill #2

## 2018-05-23 ENCOUNTER — Telehealth: Payer: Self-pay

## 2018-05-23 NOTE — Telephone Encounter (Signed)
Faxed signed orders back to Hosp[facility].

## 2018-06-15 MED FILL — QUETIAPINE 25 MG TABLET: 25 | 30 days supply | Qty: 30 | Fill #3

## 2018-06-15 MED FILL — MIRTAZAPINE 15 MG TABLET: 15 | 30 days supply | Qty: 30 | Fill #3

## 2018-06-15 MED FILL — AMLODIPINE BESYLATE 5 MG TA: 5 | 30 days supply | Qty: 30 | Fill #6

## 2018-06-21 ENCOUNTER — Telehealth: Payer: Self-pay

## 2018-06-21 NOTE — Telephone Encounter (Signed)
Faxed signed order back to [facility] at [phone], sent to [facility] for scan to chart.

## 2018-06-25 ENCOUNTER — Telehealth: Payer: Self-pay

## 2018-06-25 ENCOUNTER — Encounter: Payer: Self-pay | Admitting: Hematology

## 2018-06-25 NOTE — Telephone Encounter (Signed)
Faxed signed orders to Hosp[facility], deceased as of 07/21/22

## 2018-07-06 DEATH — deceased

## 2018-09-04 ENCOUNTER — Ambulatory Visit: Payer: Medicare Other | Admitting: Neurology

## 2019-02-22 ENCOUNTER — Ambulatory Visit: Payer: Medicare Other | Admitting: Neurology
